# Patient Record
Sex: Female | Born: 1945 | Race: White | Hispanic: No | Smoking: Former smoker
Health system: Southern US, Community
[De-identification: ages and names within clinical notes are randomized; demographics above are authoritative.]

## PROBLEM LIST (undated history)

## (undated) DIAGNOSIS — M6530 Trigger finger, unspecified finger: Secondary | ICD-10-CM

## (undated) DIAGNOSIS — I7122 Aneurysm of the aortic arch, without rupture: Secondary | ICD-10-CM

## (undated) DIAGNOSIS — E790 Hyperuricemia without signs of inflammatory arthritis and tophaceous disease: Secondary | ICD-10-CM

## (undated) DIAGNOSIS — Z9289 Personal history of other medical treatment: Secondary | ICD-10-CM

## (undated) DIAGNOSIS — I10 Essential (primary) hypertension: Secondary | ICD-10-CM

## (undated) DIAGNOSIS — I319 Disease of pericardium, unspecified: Secondary | ICD-10-CM

## (undated) DIAGNOSIS — M7661 Achilles tendinitis, right leg: Secondary | ICD-10-CM

## (undated) DIAGNOSIS — M359 Systemic involvement of connective tissue, unspecified: Secondary | ICD-10-CM

## (undated) DIAGNOSIS — M5136 Other intervertebral disc degeneration, lumbar region: Secondary | ICD-10-CM

## (undated) DIAGNOSIS — M4310 Spondylolisthesis, site unspecified: Secondary | ICD-10-CM

## (undated) DIAGNOSIS — K449 Diaphragmatic hernia without obstruction or gangrene: Secondary | ICD-10-CM

## (undated) DIAGNOSIS — M3501 Sicca syndrome with keratoconjunctivitis: Secondary | ICD-10-CM

## (undated) DIAGNOSIS — H16223 Keratoconjunctivitis sicca, not specified as Sjogren's, bilateral: Secondary | ICD-10-CM

## (undated) DIAGNOSIS — Q438 Other specified congenital malformations of intestine: Secondary | ICD-10-CM

## (undated) DIAGNOSIS — M3500 Sicca syndrome, unspecified: Secondary | ICD-10-CM

## (undated) DIAGNOSIS — R55 Syncope and collapse: Secondary | ICD-10-CM

## (undated) DIAGNOSIS — E119 Type 2 diabetes mellitus without complications: Secondary | ICD-10-CM

## (undated) DIAGNOSIS — M1990 Unspecified osteoarthritis, unspecified site: Secondary | ICD-10-CM

## (undated) DIAGNOSIS — I890 Lymphedema, not elsewhere classified: Secondary | ICD-10-CM

## (undated) DIAGNOSIS — M109 Gout, unspecified: Secondary | ICD-10-CM

## (undated) DIAGNOSIS — K2980 Duodenitis without bleeding: Secondary | ICD-10-CM

## (undated) DIAGNOSIS — M67471 Ganglion, right ankle and foot: Secondary | ICD-10-CM

## (undated) DIAGNOSIS — E785 Hyperlipidemia, unspecified: Secondary | ICD-10-CM

## (undated) DIAGNOSIS — K279 Peptic ulcer, site unspecified, unspecified as acute or chronic, without hemorrhage or perforation: Secondary | ICD-10-CM

## (undated) DIAGNOSIS — M51369 Other intervertebral disc degeneration, lumbar region without mention of lumbar back pain or lower extremity pain: Secondary | ICD-10-CM

## (undated) DIAGNOSIS — J45909 Unspecified asthma, uncomplicated: Secondary | ICD-10-CM

## (undated) DIAGNOSIS — K219 Gastro-esophageal reflux disease without esophagitis: Secondary | ICD-10-CM

## (undated) DIAGNOSIS — K5790 Diverticulosis of intestine, part unspecified, without perforation or abscess without bleeding: Secondary | ICD-10-CM

## (undated) HISTORY — DX: Unspecified osteoarthritis, unspecified site: M19.90

## (undated) HISTORY — DX: Diverticulosis of intestine, part unspecified, without perforation or abscess without bleeding: K57.90

## (undated) HISTORY — DX: Ganglion, right ankle and foot: M67.471

## (undated) HISTORY — DX: Other intervertebral disc degeneration, lumbar region: M51.36

## (undated) HISTORY — PX: CHOLECYSTECTOMY: SHX55

## (undated) HISTORY — DX: Sjogren syndrome, unspecified: M35.00

## (undated) HISTORY — PX: SHOULDER SURGERY: SHX246

## (undated) HISTORY — PX: OTHER SURGICAL HISTORY: SHX169

## (undated) HISTORY — DX: Other specified congenital malformations of intestine: Q43.8

## (undated) HISTORY — DX: Duodenitis without bleeding: K29.80

## (undated) HISTORY — DX: Unspecified asthma, uncomplicated: J45.909

## (undated) HISTORY — PX: KNEE SURGERY: SHX244

## (undated) HISTORY — PX: TONSILLECTOMY AND ADENOIDECTOMY: SUR1326

## (undated) HISTORY — DX: Sjogren syndrome with keratoconjunctivitis: M35.01

## (undated) HISTORY — DX: Disease of pericardium, unspecified: I31.9

## (undated) HISTORY — DX: Diaphragmatic hernia without obstruction or gangrene: K44.9

## (undated) HISTORY — PX: BREAST BIOPSY: SHX20

## (undated) HISTORY — DX: Systemic involvement of connective tissue, unspecified: M35.9

## (undated) HISTORY — DX: Peptic ulcer, site unspecified, unspecified as acute or chronic, without hemorrhage or perforation: K27.9

## (undated) HISTORY — DX: Hyperuricemia without signs of inflammatory arthritis and tophaceous disease: E79.0

## (undated) HISTORY — DX: Lymphedema, not elsewhere classified: I89.0

## (undated) HISTORY — DX: Type 2 diabetes mellitus without complications: E11.9

## (undated) HISTORY — DX: Personal history of other medical treatment: Z92.89

## (undated) HISTORY — DX: Keratoconjunctivitis sicca, not specified as Sjogren's, bilateral: H16.223

## (undated) HISTORY — DX: Hyperlipidemia, unspecified: E78.5

## (undated) HISTORY — DX: Syncope and collapse: R55

## (undated) HISTORY — DX: Essential (primary) hypertension: I10

## (undated) HISTORY — PX: ABDOMINAL HYSTERECTOMY: SHX81

## (undated) HISTORY — DX: Other intervertebral disc degeneration, lumbar region without mention of lumbar back pain or lower extremity pain: M51.369

## (undated) HISTORY — DX: Gastro-esophageal reflux disease without esophagitis: K21.9

## (undated) HISTORY — DX: Spondylolisthesis, site unspecified: M43.10

## (undated) HISTORY — DX: Trigger finger, unspecified finger: M65.30

## (undated) HISTORY — PX: WISDOM TOOTH EXTRACTION: SHX21

## (undated) HISTORY — DX: Achilles tendinitis, right leg: M76.61

## (undated) HISTORY — DX: Gout, unspecified: M10.9

## (undated) HISTORY — PX: EXCISION NEUROMA: SHX6350

---

## 1983-10-10 HISTORY — PX: OTHER SURGICAL HISTORY: SHX169

## 1984-06-11 HISTORY — PX: BREAST EXCISIONAL BIOPSY: SUR124

## 2007-04-12 HISTORY — PX: TOTAL HIP ARTHROPLASTY: SHX124

## 2010-06-30 DIAGNOSIS — D126 Benign neoplasm of colon, unspecified: Secondary | ICD-10-CM | POA: Insufficient documentation

## 2010-06-30 DIAGNOSIS — R1011 Right upper quadrant pain: Secondary | ICD-10-CM | POA: Insufficient documentation

## 2014-07-29 DIAGNOSIS — M25511 Pain in right shoulder: Secondary | ICD-10-CM | POA: Insufficient documentation

## 2014-08-02 DIAGNOSIS — M87052 Idiopathic aseptic necrosis of left femur: Secondary | ICD-10-CM | POA: Insufficient documentation

## 2014-08-02 DIAGNOSIS — E7800 Pure hypercholesterolemia, unspecified: Secondary | ICD-10-CM | POA: Insufficient documentation

## 2014-08-02 DIAGNOSIS — M6530 Trigger finger, unspecified finger: Secondary | ICD-10-CM | POA: Insufficient documentation

## 2014-08-02 DIAGNOSIS — E790 Hyperuricemia without signs of inflammatory arthritis and tophaceous disease: Secondary | ICD-10-CM | POA: Insufficient documentation

## 2014-08-02 DIAGNOSIS — K5750 Diverticulosis of both small and large intestine without perforation or abscess without bleeding: Secondary | ICD-10-CM | POA: Insufficient documentation

## 2014-08-02 DIAGNOSIS — K660 Peritoneal adhesions (postprocedural) (postinfection): Secondary | ICD-10-CM | POA: Insufficient documentation

## 2014-08-02 DIAGNOSIS — M47816 Spondylosis without myelopathy or radiculopathy, lumbar region: Secondary | ICD-10-CM | POA: Insufficient documentation

## 2014-08-03 DIAGNOSIS — I10 Essential (primary) hypertension: Secondary | ICD-10-CM | POA: Insufficient documentation

## 2014-08-03 DIAGNOSIS — I129 Hypertensive chronic kidney disease with stage 1 through stage 4 chronic kidney disease, or unspecified chronic kidney disease: Secondary | ICD-10-CM | POA: Insufficient documentation

## 2014-08-24 DIAGNOSIS — E739 Lactose intolerance, unspecified: Secondary | ICD-10-CM | POA: Insufficient documentation

## 2014-08-24 DIAGNOSIS — R748 Abnormal levels of other serum enzymes: Secondary | ICD-10-CM | POA: Insufficient documentation

## 2014-08-24 DIAGNOSIS — R609 Edema, unspecified: Secondary | ICD-10-CM | POA: Insufficient documentation

## 2014-08-24 DIAGNOSIS — E669 Obesity, unspecified: Secondary | ICD-10-CM | POA: Insufficient documentation

## 2014-09-07 DIAGNOSIS — M3500 Sicca syndrome, unspecified: Secondary | ICD-10-CM | POA: Insufficient documentation

## 2015-04-05 DIAGNOSIS — I319 Disease of pericardium, unspecified: Secondary | ICD-10-CM | POA: Insufficient documentation

## 2015-04-05 DIAGNOSIS — M899 Disorder of bone, unspecified: Secondary | ICD-10-CM | POA: Insufficient documentation

## 2015-04-05 DIAGNOSIS — S0993XA Unspecified injury of face, initial encounter: Secondary | ICD-10-CM | POA: Insufficient documentation

## 2015-06-03 DIAGNOSIS — J4520 Mild intermittent asthma, uncomplicated: Secondary | ICD-10-CM | POA: Insufficient documentation

## 2015-06-03 DIAGNOSIS — M359 Systemic involvement of connective tissue, unspecified: Secondary | ICD-10-CM | POA: Insufficient documentation

## 2015-06-03 DIAGNOSIS — Z87898 Personal history of other specified conditions: Secondary | ICD-10-CM | POA: Insufficient documentation

## 2015-06-03 DIAGNOSIS — K222 Esophageal obstruction: Secondary | ICD-10-CM | POA: Insufficient documentation

## 2015-06-03 DIAGNOSIS — M109 Gout, unspecified: Secondary | ICD-10-CM | POA: Insufficient documentation

## 2015-06-03 DIAGNOSIS — K259 Gastric ulcer, unspecified as acute or chronic, without hemorrhage or perforation: Secondary | ICD-10-CM | POA: Insufficient documentation

## 2015-10-16 DIAGNOSIS — E118 Type 2 diabetes mellitus with unspecified complications: Secondary | ICD-10-CM | POA: Insufficient documentation

## 2015-10-16 DIAGNOSIS — E1149 Type 2 diabetes mellitus with other diabetic neurological complication: Secondary | ICD-10-CM | POA: Insufficient documentation

## 2015-10-16 DIAGNOSIS — E1140 Type 2 diabetes mellitus with diabetic neuropathy, unspecified: Secondary | ICD-10-CM | POA: Insufficient documentation

## 2015-10-20 DIAGNOSIS — E559 Vitamin D deficiency, unspecified: Secondary | ICD-10-CM | POA: Insufficient documentation

## 2015-10-20 DIAGNOSIS — M7712 Lateral epicondylitis, left elbow: Secondary | ICD-10-CM | POA: Insufficient documentation

## 2016-06-19 DIAGNOSIS — E118 Type 2 diabetes mellitus with unspecified complications: Secondary | ICD-10-CM | POA: Diagnosis not present

## 2016-06-19 DIAGNOSIS — Z6836 Body mass index (BMI) 36.0-36.9, adult: Secondary | ICD-10-CM | POA: Diagnosis not present

## 2016-06-19 DIAGNOSIS — E7800 Pure hypercholesterolemia, unspecified: Secondary | ICD-10-CM | POA: Diagnosis not present

## 2016-06-19 DIAGNOSIS — E559 Vitamin D deficiency, unspecified: Secondary | ICD-10-CM | POA: Diagnosis not present

## 2016-06-19 DIAGNOSIS — E669 Obesity, unspecified: Secondary | ICD-10-CM | POA: Diagnosis not present

## 2016-06-19 DIAGNOSIS — Z794 Long term (current) use of insulin: Secondary | ICD-10-CM | POA: Diagnosis not present

## 2016-06-19 DIAGNOSIS — E1142 Type 2 diabetes mellitus with diabetic polyneuropathy: Secondary | ICD-10-CM | POA: Diagnosis not present

## 2016-06-19 DIAGNOSIS — I10 Essential (primary) hypertension: Secondary | ICD-10-CM | POA: Diagnosis not present

## 2016-07-24 DIAGNOSIS — H40023 Open angle with borderline findings, high risk, bilateral: Secondary | ICD-10-CM | POA: Diagnosis not present

## 2016-07-24 DIAGNOSIS — E119 Type 2 diabetes mellitus without complications: Secondary | ICD-10-CM | POA: Diagnosis not present

## 2016-07-24 DIAGNOSIS — H04123 Dry eye syndrome of bilateral lacrimal glands: Secondary | ICD-10-CM | POA: Diagnosis not present

## 2016-07-24 DIAGNOSIS — H2513 Age-related nuclear cataract, bilateral: Secondary | ICD-10-CM | POA: Diagnosis not present

## 2016-07-31 DIAGNOSIS — H409 Unspecified glaucoma: Secondary | ICD-10-CM | POA: Insufficient documentation

## 2016-07-31 DIAGNOSIS — K219 Gastro-esophageal reflux disease without esophagitis: Secondary | ICD-10-CM | POA: Diagnosis not present

## 2016-07-31 DIAGNOSIS — R110 Nausea: Secondary | ICD-10-CM | POA: Diagnosis not present

## 2016-07-31 DIAGNOSIS — H269 Unspecified cataract: Secondary | ICD-10-CM | POA: Insufficient documentation

## 2016-07-31 DIAGNOSIS — E1142 Type 2 diabetes mellitus with diabetic polyneuropathy: Secondary | ICD-10-CM | POA: Diagnosis not present

## 2016-07-31 DIAGNOSIS — K259 Gastric ulcer, unspecified as acute or chronic, without hemorrhage or perforation: Secondary | ICD-10-CM | POA: Diagnosis not present

## 2016-08-07 DIAGNOSIS — Z0000 Encounter for general adult medical examination without abnormal findings: Secondary | ICD-10-CM | POA: Diagnosis not present

## 2016-08-07 DIAGNOSIS — M899 Disorder of bone, unspecified: Secondary | ICD-10-CM | POA: Diagnosis not present

## 2016-08-07 DIAGNOSIS — Z794 Long term (current) use of insulin: Secondary | ICD-10-CM | POA: Diagnosis not present

## 2016-08-07 DIAGNOSIS — E118 Type 2 diabetes mellitus with unspecified complications: Secondary | ICD-10-CM | POA: Diagnosis not present

## 2016-08-07 DIAGNOSIS — E1169 Type 2 diabetes mellitus with other specified complication: Secondary | ICD-10-CM | POA: Diagnosis not present

## 2016-08-07 DIAGNOSIS — E559 Vitamin D deficiency, unspecified: Secondary | ICD-10-CM | POA: Diagnosis not present

## 2016-08-13 DIAGNOSIS — R1011 Right upper quadrant pain: Secondary | ICD-10-CM | POA: Diagnosis not present

## 2016-08-13 DIAGNOSIS — Z8711 Personal history of peptic ulcer disease: Secondary | ICD-10-CM | POA: Diagnosis not present

## 2016-08-13 DIAGNOSIS — R112 Nausea with vomiting, unspecified: Secondary | ICD-10-CM | POA: Diagnosis not present

## 2016-08-13 DIAGNOSIS — K2930 Chronic superficial gastritis without bleeding: Secondary | ICD-10-CM | POA: Diagnosis not present

## 2016-08-13 DIAGNOSIS — I10 Essential (primary) hypertension: Secondary | ICD-10-CM | POA: Diagnosis not present

## 2016-08-13 DIAGNOSIS — K222 Esophageal obstruction: Secondary | ICD-10-CM | POA: Diagnosis not present

## 2016-08-13 DIAGNOSIS — R109 Unspecified abdominal pain: Secondary | ICD-10-CM | POA: Diagnosis not present

## 2016-08-13 DIAGNOSIS — E785 Hyperlipidemia, unspecified: Secondary | ICD-10-CM | POA: Diagnosis not present

## 2016-08-13 DIAGNOSIS — K259 Gastric ulcer, unspecified as acute or chronic, without hemorrhage or perforation: Secondary | ICD-10-CM | POA: Diagnosis not present

## 2016-08-13 DIAGNOSIS — K449 Diaphragmatic hernia without obstruction or gangrene: Secondary | ICD-10-CM | POA: Diagnosis not present

## 2016-08-13 DIAGNOSIS — K5790 Diverticulosis of intestine, part unspecified, without perforation or abscess without bleeding: Secondary | ICD-10-CM | POA: Diagnosis not present

## 2016-09-03 DIAGNOSIS — K222 Esophageal obstruction: Secondary | ICD-10-CM | POA: Diagnosis not present

## 2016-09-03 DIAGNOSIS — K449 Diaphragmatic hernia without obstruction or gangrene: Secondary | ICD-10-CM | POA: Diagnosis not present

## 2016-09-03 DIAGNOSIS — K219 Gastro-esophageal reflux disease without esophagitis: Secondary | ICD-10-CM | POA: Diagnosis not present

## 2016-09-03 DIAGNOSIS — E119 Type 2 diabetes mellitus without complications: Secondary | ICD-10-CM | POA: Diagnosis not present

## 2016-09-03 DIAGNOSIS — K3189 Other diseases of stomach and duodenum: Secondary | ICD-10-CM | POA: Diagnosis not present

## 2016-09-03 DIAGNOSIS — I10 Essential (primary) hypertension: Secondary | ICD-10-CM | POA: Diagnosis not present

## 2016-09-03 DIAGNOSIS — R933 Abnormal findings on diagnostic imaging of other parts of digestive tract: Secondary | ICD-10-CM | POA: Diagnosis not present

## 2016-09-05 DIAGNOSIS — M545 Low back pain: Secondary | ICD-10-CM | POA: Diagnosis not present

## 2016-09-05 DIAGNOSIS — M5432 Sciatica, left side: Secondary | ICD-10-CM | POA: Diagnosis not present

## 2016-09-05 DIAGNOSIS — M546 Pain in thoracic spine: Secondary | ICD-10-CM | POA: Diagnosis not present

## 2016-09-05 DIAGNOSIS — M5127 Other intervertebral disc displacement, lumbosacral region: Secondary | ICD-10-CM | POA: Diagnosis not present

## 2016-09-05 DIAGNOSIS — M9905 Segmental and somatic dysfunction of pelvic region: Secondary | ICD-10-CM | POA: Diagnosis not present

## 2016-09-05 DIAGNOSIS — M9902 Segmental and somatic dysfunction of thoracic region: Secondary | ICD-10-CM | POA: Diagnosis not present

## 2016-09-05 DIAGNOSIS — M9903 Segmental and somatic dysfunction of lumbar region: Secondary | ICD-10-CM | POA: Diagnosis not present

## 2016-09-12 DIAGNOSIS — M545 Low back pain: Secondary | ICD-10-CM | POA: Diagnosis not present

## 2016-09-12 DIAGNOSIS — M9903 Segmental and somatic dysfunction of lumbar region: Secondary | ICD-10-CM | POA: Diagnosis not present

## 2016-09-12 DIAGNOSIS — M546 Pain in thoracic spine: Secondary | ICD-10-CM | POA: Diagnosis not present

## 2016-09-12 DIAGNOSIS — M9905 Segmental and somatic dysfunction of pelvic region: Secondary | ICD-10-CM | POA: Diagnosis not present

## 2016-09-12 DIAGNOSIS — M5127 Other intervertebral disc displacement, lumbosacral region: Secondary | ICD-10-CM | POA: Diagnosis not present

## 2016-09-12 DIAGNOSIS — M5432 Sciatica, left side: Secondary | ICD-10-CM | POA: Diagnosis not present

## 2016-09-12 DIAGNOSIS — M9902 Segmental and somatic dysfunction of thoracic region: Secondary | ICD-10-CM | POA: Diagnosis not present

## 2016-09-14 DIAGNOSIS — K259 Gastric ulcer, unspecified as acute or chronic, without hemorrhage or perforation: Secondary | ICD-10-CM | POA: Diagnosis not present

## 2016-09-14 DIAGNOSIS — K2950 Unspecified chronic gastritis without bleeding: Secondary | ICD-10-CM | POA: Diagnosis not present

## 2016-09-14 DIAGNOSIS — I10 Essential (primary) hypertension: Secondary | ICD-10-CM | POA: Diagnosis not present

## 2016-09-14 DIAGNOSIS — Z0000 Encounter for general adult medical examination without abnormal findings: Secondary | ICD-10-CM | POA: Diagnosis not present

## 2016-09-19 DIAGNOSIS — M545 Low back pain: Secondary | ICD-10-CM | POA: Diagnosis not present

## 2016-09-19 DIAGNOSIS — M5127 Other intervertebral disc displacement, lumbosacral region: Secondary | ICD-10-CM | POA: Diagnosis not present

## 2016-09-19 DIAGNOSIS — M546 Pain in thoracic spine: Secondary | ICD-10-CM | POA: Diagnosis not present

## 2016-09-19 DIAGNOSIS — M9902 Segmental and somatic dysfunction of thoracic region: Secondary | ICD-10-CM | POA: Diagnosis not present

## 2016-09-19 DIAGNOSIS — M9905 Segmental and somatic dysfunction of pelvic region: Secondary | ICD-10-CM | POA: Diagnosis not present

## 2016-09-19 DIAGNOSIS — M9903 Segmental and somatic dysfunction of lumbar region: Secondary | ICD-10-CM | POA: Diagnosis not present

## 2016-09-19 DIAGNOSIS — M5432 Sciatica, left side: Secondary | ICD-10-CM | POA: Diagnosis not present

## 2016-09-21 DIAGNOSIS — K6389 Other specified diseases of intestine: Secondary | ICD-10-CM | POA: Diagnosis not present

## 2016-09-21 DIAGNOSIS — Q438 Other specified congenital malformations of intestine: Secondary | ICD-10-CM | POA: Diagnosis not present

## 2016-09-26 DIAGNOSIS — M5127 Other intervertebral disc displacement, lumbosacral region: Secondary | ICD-10-CM | POA: Diagnosis not present

## 2016-09-26 DIAGNOSIS — M5432 Sciatica, left side: Secondary | ICD-10-CM | POA: Diagnosis not present

## 2016-09-26 DIAGNOSIS — M9903 Segmental and somatic dysfunction of lumbar region: Secondary | ICD-10-CM | POA: Diagnosis not present

## 2016-09-26 DIAGNOSIS — M9902 Segmental and somatic dysfunction of thoracic region: Secondary | ICD-10-CM | POA: Diagnosis not present

## 2016-09-26 DIAGNOSIS — M545 Low back pain: Secondary | ICD-10-CM | POA: Diagnosis not present

## 2016-09-26 DIAGNOSIS — M9905 Segmental and somatic dysfunction of pelvic region: Secondary | ICD-10-CM | POA: Diagnosis not present

## 2016-09-26 DIAGNOSIS — M546 Pain in thoracic spine: Secondary | ICD-10-CM | POA: Diagnosis not present

## 2016-10-03 DIAGNOSIS — M5127 Other intervertebral disc displacement, lumbosacral region: Secondary | ICD-10-CM | POA: Diagnosis not present

## 2016-10-03 DIAGNOSIS — M9903 Segmental and somatic dysfunction of lumbar region: Secondary | ICD-10-CM | POA: Diagnosis not present

## 2016-10-03 DIAGNOSIS — M545 Low back pain: Secondary | ICD-10-CM | POA: Diagnosis not present

## 2016-10-03 DIAGNOSIS — M9902 Segmental and somatic dysfunction of thoracic region: Secondary | ICD-10-CM | POA: Diagnosis not present

## 2016-10-03 DIAGNOSIS — M9905 Segmental and somatic dysfunction of pelvic region: Secondary | ICD-10-CM | POA: Diagnosis not present

## 2016-10-03 DIAGNOSIS — M5432 Sciatica, left side: Secondary | ICD-10-CM | POA: Diagnosis not present

## 2016-10-03 DIAGNOSIS — M546 Pain in thoracic spine: Secondary | ICD-10-CM | POA: Diagnosis not present

## 2016-10-13 DIAGNOSIS — M5416 Radiculopathy, lumbar region: Secondary | ICD-10-CM | POA: Diagnosis not present

## 2016-10-23 DIAGNOSIS — M25562 Pain in left knee: Secondary | ICD-10-CM | POA: Insufficient documentation

## 2016-10-23 DIAGNOSIS — Z0000 Encounter for general adult medical examination without abnormal findings: Secondary | ICD-10-CM | POA: Diagnosis not present

## 2016-10-23 DIAGNOSIS — I10 Essential (primary) hypertension: Secondary | ICD-10-CM | POA: Diagnosis not present

## 2016-10-23 DIAGNOSIS — G8929 Other chronic pain: Secondary | ICD-10-CM | POA: Diagnosis not present

## 2016-10-30 DIAGNOSIS — M25559 Pain in unspecified hip: Secondary | ICD-10-CM | POA: Diagnosis not present

## 2016-10-30 DIAGNOSIS — M25569 Pain in unspecified knee: Secondary | ICD-10-CM | POA: Diagnosis not present

## 2016-11-01 DIAGNOSIS — T8579XA Infection and inflammatory reaction due to other internal prosthetic devices, implants and grafts, initial encounter: Secondary | ICD-10-CM | POA: Diagnosis not present

## 2016-11-01 DIAGNOSIS — K259 Gastric ulcer, unspecified as acute or chronic, without hemorrhage or perforation: Secondary | ICD-10-CM | POA: Diagnosis not present

## 2016-11-26 DIAGNOSIS — E7800 Pure hypercholesterolemia, unspecified: Secondary | ICD-10-CM | POA: Diagnosis not present

## 2016-11-26 DIAGNOSIS — I10 Essential (primary) hypertension: Secondary | ICD-10-CM | POA: Diagnosis not present

## 2016-11-26 DIAGNOSIS — E1142 Type 2 diabetes mellitus with diabetic polyneuropathy: Secondary | ICD-10-CM | POA: Diagnosis not present

## 2016-11-26 DIAGNOSIS — E119 Type 2 diabetes mellitus without complications: Secondary | ICD-10-CM | POA: Diagnosis not present

## 2016-12-16 DIAGNOSIS — N644 Mastodynia: Secondary | ICD-10-CM | POA: Diagnosis not present

## 2016-12-19 DIAGNOSIS — I491 Atrial premature depolarization: Secondary | ICD-10-CM | POA: Diagnosis not present

## 2016-12-19 DIAGNOSIS — I10 Essential (primary) hypertension: Secondary | ICD-10-CM | POA: Diagnosis not present

## 2016-12-26 DIAGNOSIS — M9905 Segmental and somatic dysfunction of pelvic region: Secondary | ICD-10-CM | POA: Diagnosis not present

## 2016-12-26 DIAGNOSIS — M5417 Radiculopathy, lumbosacral region: Secondary | ICD-10-CM | POA: Diagnosis not present

## 2016-12-26 DIAGNOSIS — M5127 Other intervertebral disc displacement, lumbosacral region: Secondary | ICD-10-CM | POA: Diagnosis not present

## 2016-12-26 DIAGNOSIS — M9903 Segmental and somatic dysfunction of lumbar region: Secondary | ICD-10-CM | POA: Diagnosis not present

## 2016-12-26 DIAGNOSIS — M545 Low back pain: Secondary | ICD-10-CM | POA: Diagnosis not present

## 2016-12-26 DIAGNOSIS — N644 Mastodynia: Secondary | ICD-10-CM | POA: Diagnosis not present

## 2016-12-26 DIAGNOSIS — N6320 Unspecified lump in the left breast, unspecified quadrant: Secondary | ICD-10-CM | POA: Diagnosis not present

## 2016-12-26 DIAGNOSIS — M9901 Segmental and somatic dysfunction of cervical region: Secondary | ICD-10-CM | POA: Diagnosis not present

## 2016-12-26 DIAGNOSIS — M531 Cervicobrachial syndrome: Secondary | ICD-10-CM | POA: Diagnosis not present

## 2016-12-31 DIAGNOSIS — M9901 Segmental and somatic dysfunction of cervical region: Secondary | ICD-10-CM | POA: Diagnosis not present

## 2016-12-31 DIAGNOSIS — M531 Cervicobrachial syndrome: Secondary | ICD-10-CM | POA: Diagnosis not present

## 2016-12-31 DIAGNOSIS — M9905 Segmental and somatic dysfunction of pelvic region: Secondary | ICD-10-CM | POA: Diagnosis not present

## 2016-12-31 DIAGNOSIS — M545 Low back pain: Secondary | ICD-10-CM | POA: Diagnosis not present

## 2016-12-31 DIAGNOSIS — M5417 Radiculopathy, lumbosacral region: Secondary | ICD-10-CM | POA: Diagnosis not present

## 2016-12-31 DIAGNOSIS — M5127 Other intervertebral disc displacement, lumbosacral region: Secondary | ICD-10-CM | POA: Diagnosis not present

## 2016-12-31 DIAGNOSIS — M9903 Segmental and somatic dysfunction of lumbar region: Secondary | ICD-10-CM | POA: Diagnosis not present

## 2017-01-01 DIAGNOSIS — N644 Mastodynia: Secondary | ICD-10-CM | POA: Diagnosis not present

## 2017-01-02 DIAGNOSIS — M9905 Segmental and somatic dysfunction of pelvic region: Secondary | ICD-10-CM | POA: Diagnosis not present

## 2017-01-02 DIAGNOSIS — M9903 Segmental and somatic dysfunction of lumbar region: Secondary | ICD-10-CM | POA: Diagnosis not present

## 2017-01-02 DIAGNOSIS — M545 Low back pain: Secondary | ICD-10-CM | POA: Diagnosis not present

## 2017-01-02 DIAGNOSIS — M531 Cervicobrachial syndrome: Secondary | ICD-10-CM | POA: Diagnosis not present

## 2017-01-02 DIAGNOSIS — M9901 Segmental and somatic dysfunction of cervical region: Secondary | ICD-10-CM | POA: Diagnosis not present

## 2017-01-02 DIAGNOSIS — M5417 Radiculopathy, lumbosacral region: Secondary | ICD-10-CM | POA: Diagnosis not present

## 2017-01-02 DIAGNOSIS — D126 Benign neoplasm of colon, unspecified: Secondary | ICD-10-CM | POA: Diagnosis not present

## 2017-01-02 DIAGNOSIS — M5127 Other intervertebral disc displacement, lumbosacral region: Secondary | ICD-10-CM | POA: Diagnosis not present

## 2017-01-07 DIAGNOSIS — M5417 Radiculopathy, lumbosacral region: Secondary | ICD-10-CM | POA: Diagnosis not present

## 2017-01-07 DIAGNOSIS — M9905 Segmental and somatic dysfunction of pelvic region: Secondary | ICD-10-CM | POA: Diagnosis not present

## 2017-01-07 DIAGNOSIS — M545 Low back pain: Secondary | ICD-10-CM | POA: Diagnosis not present

## 2017-01-07 DIAGNOSIS — M531 Cervicobrachial syndrome: Secondary | ICD-10-CM | POA: Diagnosis not present

## 2017-01-07 DIAGNOSIS — M9901 Segmental and somatic dysfunction of cervical region: Secondary | ICD-10-CM | POA: Diagnosis not present

## 2017-01-07 DIAGNOSIS — M5127 Other intervertebral disc displacement, lumbosacral region: Secondary | ICD-10-CM | POA: Diagnosis not present

## 2017-01-07 DIAGNOSIS — M9903 Segmental and somatic dysfunction of lumbar region: Secondary | ICD-10-CM | POA: Diagnosis not present

## 2017-01-09 DIAGNOSIS — M9903 Segmental and somatic dysfunction of lumbar region: Secondary | ICD-10-CM | POA: Diagnosis not present

## 2017-01-09 DIAGNOSIS — M5417 Radiculopathy, lumbosacral region: Secondary | ICD-10-CM | POA: Diagnosis not present

## 2017-01-09 DIAGNOSIS — M9901 Segmental and somatic dysfunction of cervical region: Secondary | ICD-10-CM | POA: Diagnosis not present

## 2017-01-09 DIAGNOSIS — M531 Cervicobrachial syndrome: Secondary | ICD-10-CM | POA: Diagnosis not present

## 2017-01-09 DIAGNOSIS — M545 Low back pain: Secondary | ICD-10-CM | POA: Diagnosis not present

## 2017-01-09 DIAGNOSIS — M5127 Other intervertebral disc displacement, lumbosacral region: Secondary | ICD-10-CM | POA: Diagnosis not present

## 2017-01-09 DIAGNOSIS — M9905 Segmental and somatic dysfunction of pelvic region: Secondary | ICD-10-CM | POA: Diagnosis not present

## 2017-01-14 DIAGNOSIS — E119 Type 2 diabetes mellitus without complications: Secondary | ICD-10-CM | POA: Diagnosis not present

## 2017-01-14 DIAGNOSIS — H01113 Allergic dermatitis of right eye, unspecified eyelid: Secondary | ICD-10-CM | POA: Diagnosis not present

## 2017-01-14 DIAGNOSIS — H40023 Open angle with borderline findings, high risk, bilateral: Secondary | ICD-10-CM | POA: Diagnosis not present

## 2017-01-14 DIAGNOSIS — H04123 Dry eye syndrome of bilateral lacrimal glands: Secondary | ICD-10-CM | POA: Diagnosis not present

## 2017-01-16 DIAGNOSIS — M9901 Segmental and somatic dysfunction of cervical region: Secondary | ICD-10-CM | POA: Diagnosis not present

## 2017-01-16 DIAGNOSIS — M5417 Radiculopathy, lumbosacral region: Secondary | ICD-10-CM | POA: Diagnosis not present

## 2017-01-16 DIAGNOSIS — M9905 Segmental and somatic dysfunction of pelvic region: Secondary | ICD-10-CM | POA: Diagnosis not present

## 2017-01-16 DIAGNOSIS — M5127 Other intervertebral disc displacement, lumbosacral region: Secondary | ICD-10-CM | POA: Diagnosis not present

## 2017-01-16 DIAGNOSIS — M9903 Segmental and somatic dysfunction of lumbar region: Secondary | ICD-10-CM | POA: Diagnosis not present

## 2017-01-16 DIAGNOSIS — M545 Low back pain: Secondary | ICD-10-CM | POA: Diagnosis not present

## 2017-01-16 DIAGNOSIS — M531 Cervicobrachial syndrome: Secondary | ICD-10-CM | POA: Diagnosis not present

## 2017-01-22 DIAGNOSIS — N3001 Acute cystitis with hematuria: Secondary | ICD-10-CM | POA: Diagnosis not present

## 2017-01-22 DIAGNOSIS — N3000 Acute cystitis without hematuria: Secondary | ICD-10-CM | POA: Diagnosis not present

## 2017-01-30 DIAGNOSIS — N3001 Acute cystitis with hematuria: Secondary | ICD-10-CM | POA: Diagnosis not present

## 2017-01-30 DIAGNOSIS — M545 Low back pain: Secondary | ICD-10-CM | POA: Diagnosis not present

## 2017-01-30 DIAGNOSIS — M9901 Segmental and somatic dysfunction of cervical region: Secondary | ICD-10-CM | POA: Diagnosis not present

## 2017-01-30 DIAGNOSIS — M5127 Other intervertebral disc displacement, lumbosacral region: Secondary | ICD-10-CM | POA: Diagnosis not present

## 2017-01-30 DIAGNOSIS — M9903 Segmental and somatic dysfunction of lumbar region: Secondary | ICD-10-CM | POA: Diagnosis not present

## 2017-01-30 DIAGNOSIS — M9905 Segmental and somatic dysfunction of pelvic region: Secondary | ICD-10-CM | POA: Diagnosis not present

## 2017-01-30 DIAGNOSIS — M531 Cervicobrachial syndrome: Secondary | ICD-10-CM | POA: Diagnosis not present

## 2017-01-30 DIAGNOSIS — M5417 Radiculopathy, lumbosacral region: Secondary | ICD-10-CM | POA: Diagnosis not present

## 2017-02-06 DIAGNOSIS — M9903 Segmental and somatic dysfunction of lumbar region: Secondary | ICD-10-CM | POA: Diagnosis not present

## 2017-02-06 DIAGNOSIS — M531 Cervicobrachial syndrome: Secondary | ICD-10-CM | POA: Diagnosis not present

## 2017-02-06 DIAGNOSIS — M545 Low back pain: Secondary | ICD-10-CM | POA: Diagnosis not present

## 2017-02-06 DIAGNOSIS — M9905 Segmental and somatic dysfunction of pelvic region: Secondary | ICD-10-CM | POA: Diagnosis not present

## 2017-02-06 DIAGNOSIS — M5127 Other intervertebral disc displacement, lumbosacral region: Secondary | ICD-10-CM | POA: Diagnosis not present

## 2017-02-06 DIAGNOSIS — M9901 Segmental and somatic dysfunction of cervical region: Secondary | ICD-10-CM | POA: Diagnosis not present

## 2017-02-06 DIAGNOSIS — M5417 Radiculopathy, lumbosacral region: Secondary | ICD-10-CM | POA: Diagnosis not present

## 2017-02-20 DIAGNOSIS — J209 Acute bronchitis, unspecified: Secondary | ICD-10-CM | POA: Diagnosis not present

## 2017-02-20 DIAGNOSIS — H66002 Acute suppurative otitis media without spontaneous rupture of ear drum, left ear: Secondary | ICD-10-CM | POA: Diagnosis not present

## 2017-03-04 DIAGNOSIS — Z794 Long term (current) use of insulin: Secondary | ICD-10-CM | POA: Diagnosis not present

## 2017-03-04 DIAGNOSIS — E1165 Type 2 diabetes mellitus with hyperglycemia: Secondary | ICD-10-CM | POA: Diagnosis not present

## 2017-03-04 DIAGNOSIS — Z01818 Encounter for other preprocedural examination: Secondary | ICD-10-CM | POA: Diagnosis not present

## 2017-03-06 DIAGNOSIS — E1165 Type 2 diabetes mellitus with hyperglycemia: Secondary | ICD-10-CM | POA: Diagnosis not present

## 2017-03-06 DIAGNOSIS — Z794 Long term (current) use of insulin: Secondary | ICD-10-CM | POA: Diagnosis not present

## 2017-03-06 DIAGNOSIS — Z01818 Encounter for other preprocedural examination: Secondary | ICD-10-CM | POA: Diagnosis not present

## 2017-03-11 DIAGNOSIS — E119 Type 2 diabetes mellitus without complications: Secondary | ICD-10-CM | POA: Diagnosis not present

## 2017-03-11 DIAGNOSIS — K222 Esophageal obstruction: Secondary | ICD-10-CM | POA: Diagnosis not present

## 2017-03-11 DIAGNOSIS — Z881 Allergy status to other antibiotic agents status: Secondary | ICD-10-CM | POA: Diagnosis not present

## 2017-03-11 DIAGNOSIS — I10 Essential (primary) hypertension: Secondary | ICD-10-CM | POA: Diagnosis not present

## 2017-03-11 DIAGNOSIS — Z880 Allergy status to penicillin: Secondary | ICD-10-CM | POA: Diagnosis not present

## 2017-03-11 DIAGNOSIS — J45909 Unspecified asthma, uncomplicated: Secondary | ICD-10-CM | POA: Diagnosis not present

## 2017-03-11 DIAGNOSIS — K449 Diaphragmatic hernia without obstruction or gangrene: Secondary | ICD-10-CM | POA: Diagnosis not present

## 2017-03-11 DIAGNOSIS — K3189 Other diseases of stomach and duodenum: Secondary | ICD-10-CM | POA: Diagnosis not present

## 2017-03-11 DIAGNOSIS — K2900 Acute gastritis without bleeding: Secondary | ICD-10-CM | POA: Diagnosis not present

## 2017-03-11 DIAGNOSIS — K2960 Other gastritis without bleeding: Secondary | ICD-10-CM | POA: Diagnosis not present

## 2017-03-11 DIAGNOSIS — K319 Disease of stomach and duodenum, unspecified: Secondary | ICD-10-CM | POA: Diagnosis not present

## 2017-03-11 DIAGNOSIS — M109 Gout, unspecified: Secondary | ICD-10-CM | POA: Diagnosis not present

## 2017-03-11 DIAGNOSIS — M1990 Unspecified osteoarthritis, unspecified site: Secondary | ICD-10-CM | POA: Diagnosis not present

## 2017-03-11 DIAGNOSIS — Z882 Allergy status to sulfonamides status: Secondary | ICD-10-CM | POA: Diagnosis not present

## 2017-03-11 DIAGNOSIS — E785 Hyperlipidemia, unspecified: Secondary | ICD-10-CM | POA: Diagnosis not present

## 2017-03-11 DIAGNOSIS — Z888 Allergy status to other drugs, medicaments and biological substances status: Secondary | ICD-10-CM | POA: Diagnosis not present

## 2017-03-18 DIAGNOSIS — I10 Essential (primary) hypertension: Secondary | ICD-10-CM | POA: Diagnosis not present

## 2017-03-18 DIAGNOSIS — E119 Type 2 diabetes mellitus without complications: Secondary | ICD-10-CM | POA: Diagnosis not present

## 2017-03-18 DIAGNOSIS — E1142 Type 2 diabetes mellitus with diabetic polyneuropathy: Secondary | ICD-10-CM | POA: Diagnosis not present

## 2017-03-30 DIAGNOSIS — J209 Acute bronchitis, unspecified: Secondary | ICD-10-CM | POA: Diagnosis not present

## 2017-04-11 DIAGNOSIS — R300 Dysuria: Secondary | ICD-10-CM | POA: Diagnosis not present

## 2017-04-11 DIAGNOSIS — J069 Acute upper respiratory infection, unspecified: Secondary | ICD-10-CM | POA: Diagnosis not present

## 2017-04-28 DIAGNOSIS — N3000 Acute cystitis without hematuria: Secondary | ICD-10-CM | POA: Diagnosis not present

## 2017-05-06 DIAGNOSIS — M5127 Other intervertebral disc displacement, lumbosacral region: Secondary | ICD-10-CM | POA: Diagnosis not present

## 2017-05-06 DIAGNOSIS — M9905 Segmental and somatic dysfunction of pelvic region: Secondary | ICD-10-CM | POA: Diagnosis not present

## 2017-05-06 DIAGNOSIS — M9903 Segmental and somatic dysfunction of lumbar region: Secondary | ICD-10-CM | POA: Diagnosis not present

## 2017-05-06 DIAGNOSIS — M545 Low back pain: Secondary | ICD-10-CM | POA: Diagnosis not present

## 2017-05-08 DIAGNOSIS — M9905 Segmental and somatic dysfunction of pelvic region: Secondary | ICD-10-CM | POA: Diagnosis not present

## 2017-05-08 DIAGNOSIS — M25561 Pain in right knee: Secondary | ICD-10-CM | POA: Diagnosis not present

## 2017-05-08 DIAGNOSIS — M25551 Pain in right hip: Secondary | ICD-10-CM | POA: Diagnosis not present

## 2017-05-08 DIAGNOSIS — M545 Low back pain: Secondary | ICD-10-CM | POA: Diagnosis not present

## 2017-05-08 DIAGNOSIS — M6281 Muscle weakness (generalized): Secondary | ICD-10-CM | POA: Diagnosis not present

## 2017-05-08 DIAGNOSIS — M9903 Segmental and somatic dysfunction of lumbar region: Secondary | ICD-10-CM | POA: Diagnosis not present

## 2017-05-08 DIAGNOSIS — M25562 Pain in left knee: Secondary | ICD-10-CM | POA: Diagnosis not present

## 2017-05-08 DIAGNOSIS — M5127 Other intervertebral disc displacement, lumbosacral region: Secondary | ICD-10-CM | POA: Diagnosis not present

## 2017-05-09 DIAGNOSIS — M25562 Pain in left knee: Secondary | ICD-10-CM | POA: Diagnosis not present

## 2017-05-09 DIAGNOSIS — M25561 Pain in right knee: Secondary | ICD-10-CM | POA: Diagnosis not present

## 2017-05-09 DIAGNOSIS — M25551 Pain in right hip: Secondary | ICD-10-CM | POA: Diagnosis not present

## 2017-05-09 DIAGNOSIS — M6281 Muscle weakness (generalized): Secondary | ICD-10-CM | POA: Diagnosis not present

## 2017-05-13 DIAGNOSIS — M25561 Pain in right knee: Secondary | ICD-10-CM | POA: Diagnosis not present

## 2017-05-13 DIAGNOSIS — M25562 Pain in left knee: Secondary | ICD-10-CM | POA: Diagnosis not present

## 2017-05-13 DIAGNOSIS — M9905 Segmental and somatic dysfunction of pelvic region: Secondary | ICD-10-CM | POA: Diagnosis not present

## 2017-05-13 DIAGNOSIS — I10 Essential (primary) hypertension: Secondary | ICD-10-CM | POA: Diagnosis not present

## 2017-05-13 DIAGNOSIS — M6281 Muscle weakness (generalized): Secondary | ICD-10-CM | POA: Diagnosis not present

## 2017-05-13 DIAGNOSIS — M545 Low back pain: Secondary | ICD-10-CM | POA: Diagnosis not present

## 2017-05-13 DIAGNOSIS — M1711 Unilateral primary osteoarthritis, right knee: Secondary | ICD-10-CM | POA: Diagnosis not present

## 2017-05-13 DIAGNOSIS — M25551 Pain in right hip: Secondary | ICD-10-CM | POA: Diagnosis not present

## 2017-05-13 DIAGNOSIS — E119 Type 2 diabetes mellitus without complications: Secondary | ICD-10-CM | POA: Diagnosis not present

## 2017-05-13 DIAGNOSIS — M1712 Unilateral primary osteoarthritis, left knee: Secondary | ICD-10-CM | POA: Diagnosis not present

## 2017-05-13 DIAGNOSIS — M5127 Other intervertebral disc displacement, lumbosacral region: Secondary | ICD-10-CM | POA: Diagnosis not present

## 2017-05-13 DIAGNOSIS — Z96641 Presence of right artificial hip joint: Secondary | ICD-10-CM | POA: Diagnosis not present

## 2017-05-13 DIAGNOSIS — M9903 Segmental and somatic dysfunction of lumbar region: Secondary | ICD-10-CM | POA: Diagnosis not present

## 2017-05-13 DIAGNOSIS — R2689 Other abnormalities of gait and mobility: Secondary | ICD-10-CM | POA: Diagnosis not present

## 2017-05-13 DIAGNOSIS — M1612 Unilateral primary osteoarthritis, left hip: Secondary | ICD-10-CM | POA: Diagnosis not present

## 2017-05-13 DIAGNOSIS — I890 Lymphedema, not elsewhere classified: Secondary | ICD-10-CM | POA: Diagnosis not present

## 2017-05-15 DIAGNOSIS — M545 Low back pain: Secondary | ICD-10-CM | POA: Diagnosis not present

## 2017-05-15 DIAGNOSIS — M9903 Segmental and somatic dysfunction of lumbar region: Secondary | ICD-10-CM | POA: Diagnosis not present

## 2017-05-15 DIAGNOSIS — M9905 Segmental and somatic dysfunction of pelvic region: Secondary | ICD-10-CM | POA: Diagnosis not present

## 2017-05-15 DIAGNOSIS — M5127 Other intervertebral disc displacement, lumbosacral region: Secondary | ICD-10-CM | POA: Diagnosis not present

## 2017-05-20 DIAGNOSIS — Z96641 Presence of right artificial hip joint: Secondary | ICD-10-CM | POA: Diagnosis not present

## 2017-05-20 DIAGNOSIS — R2689 Other abnormalities of gait and mobility: Secondary | ICD-10-CM | POA: Diagnosis not present

## 2017-05-20 DIAGNOSIS — M6281 Muscle weakness (generalized): Secondary | ICD-10-CM | POA: Diagnosis not present

## 2017-05-20 DIAGNOSIS — M1711 Unilateral primary osteoarthritis, right knee: Secondary | ICD-10-CM | POA: Diagnosis not present

## 2017-05-20 DIAGNOSIS — M25561 Pain in right knee: Secondary | ICD-10-CM | POA: Diagnosis not present

## 2017-05-20 DIAGNOSIS — M25562 Pain in left knee: Secondary | ICD-10-CM | POA: Diagnosis not present

## 2017-05-20 DIAGNOSIS — E119 Type 2 diabetes mellitus without complications: Secondary | ICD-10-CM | POA: Diagnosis not present

## 2017-05-20 DIAGNOSIS — I890 Lymphedema, not elsewhere classified: Secondary | ICD-10-CM | POA: Diagnosis not present

## 2017-05-20 DIAGNOSIS — M1712 Unilateral primary osteoarthritis, left knee: Secondary | ICD-10-CM | POA: Diagnosis not present

## 2017-05-20 DIAGNOSIS — I10 Essential (primary) hypertension: Secondary | ICD-10-CM | POA: Diagnosis not present

## 2017-05-20 DIAGNOSIS — M1612 Unilateral primary osteoarthritis, left hip: Secondary | ICD-10-CM | POA: Diagnosis not present

## 2017-05-20 DIAGNOSIS — M25551 Pain in right hip: Secondary | ICD-10-CM | POA: Diagnosis not present

## 2017-05-22 DIAGNOSIS — M545 Low back pain: Secondary | ICD-10-CM | POA: Diagnosis not present

## 2017-05-22 DIAGNOSIS — M9904 Segmental and somatic dysfunction of sacral region: Secondary | ICD-10-CM | POA: Diagnosis not present

## 2017-05-22 DIAGNOSIS — M5127 Other intervertebral disc displacement, lumbosacral region: Secondary | ICD-10-CM | POA: Diagnosis not present

## 2017-05-22 DIAGNOSIS — M5432 Sciatica, left side: Secondary | ICD-10-CM | POA: Diagnosis not present

## 2017-05-22 DIAGNOSIS — M9905 Segmental and somatic dysfunction of pelvic region: Secondary | ICD-10-CM | POA: Diagnosis not present

## 2017-05-22 DIAGNOSIS — M9903 Segmental and somatic dysfunction of lumbar region: Secondary | ICD-10-CM | POA: Diagnosis not present

## 2017-05-23 DIAGNOSIS — I890 Lymphedema, not elsewhere classified: Secondary | ICD-10-CM | POA: Diagnosis not present

## 2017-05-23 DIAGNOSIS — R2689 Other abnormalities of gait and mobility: Secondary | ICD-10-CM | POA: Diagnosis not present

## 2017-05-23 DIAGNOSIS — Z96641 Presence of right artificial hip joint: Secondary | ICD-10-CM | POA: Diagnosis not present

## 2017-05-23 DIAGNOSIS — M1711 Unilateral primary osteoarthritis, right knee: Secondary | ICD-10-CM | POA: Diagnosis not present

## 2017-05-23 DIAGNOSIS — M25562 Pain in left knee: Secondary | ICD-10-CM | POA: Diagnosis not present

## 2017-05-23 DIAGNOSIS — I10 Essential (primary) hypertension: Secondary | ICD-10-CM | POA: Diagnosis not present

## 2017-05-23 DIAGNOSIS — M25561 Pain in right knee: Secondary | ICD-10-CM | POA: Diagnosis not present

## 2017-05-23 DIAGNOSIS — M25551 Pain in right hip: Secondary | ICD-10-CM | POA: Diagnosis not present

## 2017-05-23 DIAGNOSIS — M1612 Unilateral primary osteoarthritis, left hip: Secondary | ICD-10-CM | POA: Diagnosis not present

## 2017-05-23 DIAGNOSIS — M6281 Muscle weakness (generalized): Secondary | ICD-10-CM | POA: Diagnosis not present

## 2017-05-23 DIAGNOSIS — M1712 Unilateral primary osteoarthritis, left knee: Secondary | ICD-10-CM | POA: Diagnosis not present

## 2017-05-23 DIAGNOSIS — E119 Type 2 diabetes mellitus without complications: Secondary | ICD-10-CM | POA: Diagnosis not present

## 2017-05-27 DIAGNOSIS — M6281 Muscle weakness (generalized): Secondary | ICD-10-CM | POA: Diagnosis not present

## 2017-05-27 DIAGNOSIS — E119 Type 2 diabetes mellitus without complications: Secondary | ICD-10-CM | POA: Diagnosis not present

## 2017-05-27 DIAGNOSIS — Z96641 Presence of right artificial hip joint: Secondary | ICD-10-CM | POA: Diagnosis not present

## 2017-05-27 DIAGNOSIS — M1712 Unilateral primary osteoarthritis, left knee: Secondary | ICD-10-CM | POA: Diagnosis not present

## 2017-05-27 DIAGNOSIS — M1711 Unilateral primary osteoarthritis, right knee: Secondary | ICD-10-CM | POA: Diagnosis not present

## 2017-05-27 DIAGNOSIS — R2689 Other abnormalities of gait and mobility: Secondary | ICD-10-CM | POA: Diagnosis not present

## 2017-05-27 DIAGNOSIS — I890 Lymphedema, not elsewhere classified: Secondary | ICD-10-CM | POA: Diagnosis not present

## 2017-05-27 DIAGNOSIS — I10 Essential (primary) hypertension: Secondary | ICD-10-CM | POA: Diagnosis not present

## 2017-05-27 DIAGNOSIS — M25561 Pain in right knee: Secondary | ICD-10-CM | POA: Diagnosis not present

## 2017-05-27 DIAGNOSIS — M25551 Pain in right hip: Secondary | ICD-10-CM | POA: Diagnosis not present

## 2017-05-27 DIAGNOSIS — M25562 Pain in left knee: Secondary | ICD-10-CM | POA: Diagnosis not present

## 2017-05-27 DIAGNOSIS — M1612 Unilateral primary osteoarthritis, left hip: Secondary | ICD-10-CM | POA: Diagnosis not present

## 2017-05-30 DIAGNOSIS — M25562 Pain in left knee: Secondary | ICD-10-CM | POA: Diagnosis not present

## 2017-05-30 DIAGNOSIS — Z96641 Presence of right artificial hip joint: Secondary | ICD-10-CM | POA: Diagnosis not present

## 2017-05-30 DIAGNOSIS — M6281 Muscle weakness (generalized): Secondary | ICD-10-CM | POA: Diagnosis not present

## 2017-05-30 DIAGNOSIS — M1711 Unilateral primary osteoarthritis, right knee: Secondary | ICD-10-CM | POA: Diagnosis not present

## 2017-05-30 DIAGNOSIS — M25551 Pain in right hip: Secondary | ICD-10-CM | POA: Diagnosis not present

## 2017-05-30 DIAGNOSIS — M1612 Unilateral primary osteoarthritis, left hip: Secondary | ICD-10-CM | POA: Diagnosis not present

## 2017-05-30 DIAGNOSIS — I10 Essential (primary) hypertension: Secondary | ICD-10-CM | POA: Diagnosis not present

## 2017-05-30 DIAGNOSIS — E119 Type 2 diabetes mellitus without complications: Secondary | ICD-10-CM | POA: Diagnosis not present

## 2017-05-30 DIAGNOSIS — M1712 Unilateral primary osteoarthritis, left knee: Secondary | ICD-10-CM | POA: Diagnosis not present

## 2017-05-30 DIAGNOSIS — I890 Lymphedema, not elsewhere classified: Secondary | ICD-10-CM | POA: Diagnosis not present

## 2017-05-30 DIAGNOSIS — M25561 Pain in right knee: Secondary | ICD-10-CM | POA: Diagnosis not present

## 2017-05-30 DIAGNOSIS — R2689 Other abnormalities of gait and mobility: Secondary | ICD-10-CM | POA: Diagnosis not present

## 2017-06-13 DIAGNOSIS — Z96641 Presence of right artificial hip joint: Secondary | ICD-10-CM | POA: Diagnosis not present

## 2017-06-13 DIAGNOSIS — M1712 Unilateral primary osteoarthritis, left knee: Secondary | ICD-10-CM | POA: Diagnosis not present

## 2017-06-13 DIAGNOSIS — I890 Lymphedema, not elsewhere classified: Secondary | ICD-10-CM | POA: Diagnosis not present

## 2017-06-13 DIAGNOSIS — M25562 Pain in left knee: Secondary | ICD-10-CM | POA: Diagnosis not present

## 2017-06-13 DIAGNOSIS — M1711 Unilateral primary osteoarthritis, right knee: Secondary | ICD-10-CM | POA: Diagnosis not present

## 2017-06-13 DIAGNOSIS — E119 Type 2 diabetes mellitus without complications: Secondary | ICD-10-CM | POA: Diagnosis not present

## 2017-06-13 DIAGNOSIS — I10 Essential (primary) hypertension: Secondary | ICD-10-CM | POA: Diagnosis not present

## 2017-06-13 DIAGNOSIS — M25551 Pain in right hip: Secondary | ICD-10-CM | POA: Diagnosis not present

## 2017-06-13 DIAGNOSIS — M1612 Unilateral primary osteoarthritis, left hip: Secondary | ICD-10-CM | POA: Diagnosis not present

## 2017-06-13 DIAGNOSIS — M25561 Pain in right knee: Secondary | ICD-10-CM | POA: Diagnosis not present

## 2017-06-13 DIAGNOSIS — M6281 Muscle weakness (generalized): Secondary | ICD-10-CM | POA: Diagnosis not present

## 2017-06-13 DIAGNOSIS — R2689 Other abnormalities of gait and mobility: Secondary | ICD-10-CM | POA: Diagnosis not present

## 2017-07-05 DIAGNOSIS — E119 Type 2 diabetes mellitus without complications: Secondary | ICD-10-CM | POA: Diagnosis not present

## 2017-07-05 DIAGNOSIS — E1165 Type 2 diabetes mellitus with hyperglycemia: Secondary | ICD-10-CM | POA: Diagnosis not present

## 2017-07-05 DIAGNOSIS — Z794 Long term (current) use of insulin: Secondary | ICD-10-CM | POA: Diagnosis not present

## 2017-07-17 DIAGNOSIS — E1142 Type 2 diabetes mellitus with diabetic polyneuropathy: Secondary | ICD-10-CM | POA: Diagnosis not present

## 2017-07-17 DIAGNOSIS — E7800 Pure hypercholesterolemia, unspecified: Secondary | ICD-10-CM | POA: Diagnosis not present

## 2017-07-17 DIAGNOSIS — I10 Essential (primary) hypertension: Secondary | ICD-10-CM | POA: Diagnosis not present

## 2017-07-17 DIAGNOSIS — K219 Gastro-esophageal reflux disease without esophagitis: Secondary | ICD-10-CM | POA: Diagnosis not present

## 2017-08-20 DIAGNOSIS — H04123 Dry eye syndrome of bilateral lacrimal glands: Secondary | ICD-10-CM | POA: Diagnosis not present

## 2017-08-20 DIAGNOSIS — H16101 Unspecified superficial keratitis, right eye: Secondary | ICD-10-CM | POA: Diagnosis not present

## 2017-08-20 DIAGNOSIS — E119 Type 2 diabetes mellitus without complications: Secondary | ICD-10-CM | POA: Diagnosis not present

## 2017-08-20 DIAGNOSIS — H40013 Open angle with borderline findings, low risk, bilateral: Secondary | ICD-10-CM | POA: Diagnosis not present

## 2017-09-09 DIAGNOSIS — M9905 Segmental and somatic dysfunction of pelvic region: Secondary | ICD-10-CM | POA: Diagnosis not present

## 2017-09-09 DIAGNOSIS — M9902 Segmental and somatic dysfunction of thoracic region: Secondary | ICD-10-CM | POA: Diagnosis not present

## 2017-09-09 DIAGNOSIS — M9903 Segmental and somatic dysfunction of lumbar region: Secondary | ICD-10-CM | POA: Diagnosis not present

## 2017-09-09 DIAGNOSIS — M9904 Segmental and somatic dysfunction of sacral region: Secondary | ICD-10-CM | POA: Diagnosis not present

## 2017-09-09 DIAGNOSIS — M5127 Other intervertebral disc displacement, lumbosacral region: Secondary | ICD-10-CM | POA: Diagnosis not present

## 2017-09-09 DIAGNOSIS — M545 Low back pain: Secondary | ICD-10-CM | POA: Diagnosis not present

## 2017-09-09 DIAGNOSIS — M5432 Sciatica, left side: Secondary | ICD-10-CM | POA: Diagnosis not present

## 2017-09-09 DIAGNOSIS — M546 Pain in thoracic spine: Secondary | ICD-10-CM | POA: Diagnosis not present

## 2017-09-11 DIAGNOSIS — M9902 Segmental and somatic dysfunction of thoracic region: Secondary | ICD-10-CM | POA: Diagnosis not present

## 2017-09-11 DIAGNOSIS — M546 Pain in thoracic spine: Secondary | ICD-10-CM | POA: Diagnosis not present

## 2017-09-11 DIAGNOSIS — M5432 Sciatica, left side: Secondary | ICD-10-CM | POA: Diagnosis not present

## 2017-09-11 DIAGNOSIS — M9905 Segmental and somatic dysfunction of pelvic region: Secondary | ICD-10-CM | POA: Diagnosis not present

## 2017-09-11 DIAGNOSIS — M5127 Other intervertebral disc displacement, lumbosacral region: Secondary | ICD-10-CM | POA: Diagnosis not present

## 2017-09-11 DIAGNOSIS — M545 Low back pain: Secondary | ICD-10-CM | POA: Diagnosis not present

## 2017-09-11 DIAGNOSIS — M9903 Segmental and somatic dysfunction of lumbar region: Secondary | ICD-10-CM | POA: Diagnosis not present

## 2017-09-11 DIAGNOSIS — M9904 Segmental and somatic dysfunction of sacral region: Secondary | ICD-10-CM | POA: Diagnosis not present

## 2017-09-16 DIAGNOSIS — M5432 Sciatica, left side: Secondary | ICD-10-CM | POA: Diagnosis not present

## 2017-09-16 DIAGNOSIS — M9905 Segmental and somatic dysfunction of pelvic region: Secondary | ICD-10-CM | POA: Diagnosis not present

## 2017-09-16 DIAGNOSIS — M546 Pain in thoracic spine: Secondary | ICD-10-CM | POA: Diagnosis not present

## 2017-09-16 DIAGNOSIS — M545 Low back pain: Secondary | ICD-10-CM | POA: Diagnosis not present

## 2017-09-16 DIAGNOSIS — M9903 Segmental and somatic dysfunction of lumbar region: Secondary | ICD-10-CM | POA: Diagnosis not present

## 2017-09-16 DIAGNOSIS — M9902 Segmental and somatic dysfunction of thoracic region: Secondary | ICD-10-CM | POA: Diagnosis not present

## 2017-09-16 DIAGNOSIS — M5127 Other intervertebral disc displacement, lumbosacral region: Secondary | ICD-10-CM | POA: Diagnosis not present

## 2017-09-16 DIAGNOSIS — M9904 Segmental and somatic dysfunction of sacral region: Secondary | ICD-10-CM | POA: Diagnosis not present

## 2017-09-18 DIAGNOSIS — M9903 Segmental and somatic dysfunction of lumbar region: Secondary | ICD-10-CM | POA: Diagnosis not present

## 2017-09-18 DIAGNOSIS — M9905 Segmental and somatic dysfunction of pelvic region: Secondary | ICD-10-CM | POA: Diagnosis not present

## 2017-09-18 DIAGNOSIS — M9904 Segmental and somatic dysfunction of sacral region: Secondary | ICD-10-CM | POA: Diagnosis not present

## 2017-09-18 DIAGNOSIS — M9902 Segmental and somatic dysfunction of thoracic region: Secondary | ICD-10-CM | POA: Diagnosis not present

## 2017-09-18 DIAGNOSIS — M5127 Other intervertebral disc displacement, lumbosacral region: Secondary | ICD-10-CM | POA: Diagnosis not present

## 2017-09-18 DIAGNOSIS — M5432 Sciatica, left side: Secondary | ICD-10-CM | POA: Diagnosis not present

## 2017-09-18 DIAGNOSIS — M546 Pain in thoracic spine: Secondary | ICD-10-CM | POA: Diagnosis not present

## 2017-09-18 DIAGNOSIS — M545 Low back pain: Secondary | ICD-10-CM | POA: Diagnosis not present

## 2017-09-21 DIAGNOSIS — N3001 Acute cystitis with hematuria: Secondary | ICD-10-CM | POA: Diagnosis not present

## 2017-09-23 DIAGNOSIS — M9902 Segmental and somatic dysfunction of thoracic region: Secondary | ICD-10-CM | POA: Diagnosis not present

## 2017-09-23 DIAGNOSIS — M545 Low back pain: Secondary | ICD-10-CM | POA: Diagnosis not present

## 2017-09-23 DIAGNOSIS — M546 Pain in thoracic spine: Secondary | ICD-10-CM | POA: Diagnosis not present

## 2017-09-23 DIAGNOSIS — M5432 Sciatica, left side: Secondary | ICD-10-CM | POA: Diagnosis not present

## 2017-09-23 DIAGNOSIS — M9905 Segmental and somatic dysfunction of pelvic region: Secondary | ICD-10-CM | POA: Diagnosis not present

## 2017-09-23 DIAGNOSIS — M5127 Other intervertebral disc displacement, lumbosacral region: Secondary | ICD-10-CM | POA: Diagnosis not present

## 2017-09-23 DIAGNOSIS — M9903 Segmental and somatic dysfunction of lumbar region: Secondary | ICD-10-CM | POA: Diagnosis not present

## 2017-09-23 DIAGNOSIS — M9904 Segmental and somatic dysfunction of sacral region: Secondary | ICD-10-CM | POA: Diagnosis not present

## 2017-09-25 DIAGNOSIS — M5127 Other intervertebral disc displacement, lumbosacral region: Secondary | ICD-10-CM | POA: Diagnosis not present

## 2017-09-25 DIAGNOSIS — M9904 Segmental and somatic dysfunction of sacral region: Secondary | ICD-10-CM | POA: Diagnosis not present

## 2017-09-25 DIAGNOSIS — M5432 Sciatica, left side: Secondary | ICD-10-CM | POA: Diagnosis not present

## 2017-09-25 DIAGNOSIS — M546 Pain in thoracic spine: Secondary | ICD-10-CM | POA: Diagnosis not present

## 2017-09-25 DIAGNOSIS — M545 Low back pain: Secondary | ICD-10-CM | POA: Diagnosis not present

## 2017-09-25 DIAGNOSIS — M9903 Segmental and somatic dysfunction of lumbar region: Secondary | ICD-10-CM | POA: Diagnosis not present

## 2017-09-25 DIAGNOSIS — M9905 Segmental and somatic dysfunction of pelvic region: Secondary | ICD-10-CM | POA: Diagnosis not present

## 2017-09-25 DIAGNOSIS — M9902 Segmental and somatic dysfunction of thoracic region: Secondary | ICD-10-CM | POA: Diagnosis not present

## 2017-10-02 DIAGNOSIS — M5127 Other intervertebral disc displacement, lumbosacral region: Secondary | ICD-10-CM | POA: Diagnosis not present

## 2017-10-02 DIAGNOSIS — M9902 Segmental and somatic dysfunction of thoracic region: Secondary | ICD-10-CM | POA: Diagnosis not present

## 2017-10-02 DIAGNOSIS — M545 Low back pain: Secondary | ICD-10-CM | POA: Diagnosis not present

## 2017-10-02 DIAGNOSIS — M5432 Sciatica, left side: Secondary | ICD-10-CM | POA: Diagnosis not present

## 2017-10-02 DIAGNOSIS — M9903 Segmental and somatic dysfunction of lumbar region: Secondary | ICD-10-CM | POA: Diagnosis not present

## 2017-10-02 DIAGNOSIS — M546 Pain in thoracic spine: Secondary | ICD-10-CM | POA: Diagnosis not present

## 2017-10-02 DIAGNOSIS — M9904 Segmental and somatic dysfunction of sacral region: Secondary | ICD-10-CM | POA: Diagnosis not present

## 2017-10-02 DIAGNOSIS — M9905 Segmental and somatic dysfunction of pelvic region: Secondary | ICD-10-CM | POA: Diagnosis not present

## 2017-10-04 DIAGNOSIS — E119 Type 2 diabetes mellitus without complications: Secondary | ICD-10-CM | POA: Diagnosis not present

## 2017-10-04 DIAGNOSIS — I10 Essential (primary) hypertension: Secondary | ICD-10-CM | POA: Diagnosis not present

## 2017-10-04 DIAGNOSIS — E1142 Type 2 diabetes mellitus with diabetic polyneuropathy: Secondary | ICD-10-CM | POA: Diagnosis not present

## 2017-10-04 DIAGNOSIS — E7800 Pure hypercholesterolemia, unspecified: Secondary | ICD-10-CM | POA: Diagnosis not present

## 2017-10-09 DIAGNOSIS — M546 Pain in thoracic spine: Secondary | ICD-10-CM | POA: Diagnosis not present

## 2017-10-09 DIAGNOSIS — M9904 Segmental and somatic dysfunction of sacral region: Secondary | ICD-10-CM | POA: Diagnosis not present

## 2017-10-09 DIAGNOSIS — M545 Low back pain: Secondary | ICD-10-CM | POA: Diagnosis not present

## 2017-10-09 DIAGNOSIS — M9902 Segmental and somatic dysfunction of thoracic region: Secondary | ICD-10-CM | POA: Diagnosis not present

## 2017-10-09 DIAGNOSIS — M9905 Segmental and somatic dysfunction of pelvic region: Secondary | ICD-10-CM | POA: Diagnosis not present

## 2017-10-09 DIAGNOSIS — M9903 Segmental and somatic dysfunction of lumbar region: Secondary | ICD-10-CM | POA: Diagnosis not present

## 2017-10-09 DIAGNOSIS — M5432 Sciatica, left side: Secondary | ICD-10-CM | POA: Diagnosis not present

## 2017-10-09 DIAGNOSIS — M5127 Other intervertebral disc displacement, lumbosacral region: Secondary | ICD-10-CM | POA: Diagnosis not present

## 2017-10-16 DIAGNOSIS — M9904 Segmental and somatic dysfunction of sacral region: Secondary | ICD-10-CM | POA: Diagnosis not present

## 2017-10-16 DIAGNOSIS — M546 Pain in thoracic spine: Secondary | ICD-10-CM | POA: Diagnosis not present

## 2017-10-16 DIAGNOSIS — M545 Low back pain: Secondary | ICD-10-CM | POA: Diagnosis not present

## 2017-10-16 DIAGNOSIS — M5127 Other intervertebral disc displacement, lumbosacral region: Secondary | ICD-10-CM | POA: Diagnosis not present

## 2017-10-16 DIAGNOSIS — M9905 Segmental and somatic dysfunction of pelvic region: Secondary | ICD-10-CM | POA: Diagnosis not present

## 2017-10-16 DIAGNOSIS — M9902 Segmental and somatic dysfunction of thoracic region: Secondary | ICD-10-CM | POA: Diagnosis not present

## 2017-10-16 DIAGNOSIS — M9903 Segmental and somatic dysfunction of lumbar region: Secondary | ICD-10-CM | POA: Diagnosis not present

## 2017-10-16 DIAGNOSIS — M5432 Sciatica, left side: Secondary | ICD-10-CM | POA: Diagnosis not present

## 2017-10-30 DIAGNOSIS — M5127 Other intervertebral disc displacement, lumbosacral region: Secondary | ICD-10-CM | POA: Diagnosis not present

## 2017-10-30 DIAGNOSIS — M9902 Segmental and somatic dysfunction of thoracic region: Secondary | ICD-10-CM | POA: Diagnosis not present

## 2017-10-30 DIAGNOSIS — M9904 Segmental and somatic dysfunction of sacral region: Secondary | ICD-10-CM | POA: Diagnosis not present

## 2017-10-30 DIAGNOSIS — M545 Low back pain: Secondary | ICD-10-CM | POA: Diagnosis not present

## 2017-10-30 DIAGNOSIS — M9903 Segmental and somatic dysfunction of lumbar region: Secondary | ICD-10-CM | POA: Diagnosis not present

## 2017-10-30 DIAGNOSIS — M9905 Segmental and somatic dysfunction of pelvic region: Secondary | ICD-10-CM | POA: Diagnosis not present

## 2017-10-30 DIAGNOSIS — M5432 Sciatica, left side: Secondary | ICD-10-CM | POA: Diagnosis not present

## 2017-10-30 DIAGNOSIS — M546 Pain in thoracic spine: Secondary | ICD-10-CM | POA: Diagnosis not present

## 2017-11-02 DIAGNOSIS — N3001 Acute cystitis with hematuria: Secondary | ICD-10-CM | POA: Diagnosis not present

## 2017-11-02 DIAGNOSIS — M25561 Pain in right knee: Secondary | ICD-10-CM | POA: Diagnosis not present

## 2017-11-12 DIAGNOSIS — M25569 Pain in unspecified knee: Secondary | ICD-10-CM | POA: Diagnosis not present

## 2017-11-20 DIAGNOSIS — M25461 Effusion, right knee: Secondary | ICD-10-CM | POA: Diagnosis not present

## 2017-12-19 DIAGNOSIS — N3001 Acute cystitis with hematuria: Secondary | ICD-10-CM | POA: Diagnosis not present

## 2018-02-12 DIAGNOSIS — R310 Gross hematuria: Secondary | ICD-10-CM | POA: Diagnosis not present

## 2018-02-12 DIAGNOSIS — N3021 Other chronic cystitis with hematuria: Secondary | ICD-10-CM | POA: Diagnosis not present

## 2018-02-12 DIAGNOSIS — N393 Stress incontinence (female) (male): Secondary | ICD-10-CM | POA: Diagnosis not present

## 2018-02-12 DIAGNOSIS — E6609 Other obesity due to excess calories: Secondary | ICD-10-CM | POA: Diagnosis not present

## 2018-02-26 DIAGNOSIS — Z794 Long term (current) use of insulin: Secondary | ICD-10-CM | POA: Diagnosis not present

## 2018-02-26 DIAGNOSIS — Z8639 Personal history of other endocrine, nutritional and metabolic disease: Secondary | ICD-10-CM | POA: Diagnosis not present

## 2018-02-26 DIAGNOSIS — G8929 Other chronic pain: Secondary | ICD-10-CM | POA: Diagnosis not present

## 2018-02-26 DIAGNOSIS — E119 Type 2 diabetes mellitus without complications: Secondary | ICD-10-CM | POA: Diagnosis not present

## 2018-02-26 DIAGNOSIS — I10 Essential (primary) hypertension: Secondary | ICD-10-CM | POA: Diagnosis not present

## 2018-02-26 DIAGNOSIS — N3020 Other chronic cystitis without hematuria: Secondary | ICD-10-CM | POA: Diagnosis not present

## 2018-02-26 DIAGNOSIS — R310 Gross hematuria: Secondary | ICD-10-CM | POA: Diagnosis not present

## 2018-02-26 DIAGNOSIS — M545 Low back pain: Secondary | ICD-10-CM | POA: Diagnosis not present

## 2018-02-26 DIAGNOSIS — N952 Postmenopausal atrophic vaginitis: Secondary | ICD-10-CM | POA: Diagnosis not present

## 2018-02-26 DIAGNOSIS — K219 Gastro-esophageal reflux disease without esophagitis: Secondary | ICD-10-CM | POA: Diagnosis not present

## 2018-02-26 HISTORY — PX: CYSTOSCOPY: SUR368

## 2018-02-28 ENCOUNTER — Other Ambulatory Visit: Payer: Self-pay | Admitting: Orthopedic Surgery

## 2018-02-28 DIAGNOSIS — R310 Gross hematuria: Secondary | ICD-10-CM

## 2018-03-10 ENCOUNTER — Ambulatory Visit: Admission: RE | Admit: 2018-03-10 | Payer: Self-pay | Source: Ambulatory Visit

## 2018-03-26 DIAGNOSIS — R109 Unspecified abdominal pain: Secondary | ICD-10-CM | POA: Diagnosis not present

## 2018-03-26 DIAGNOSIS — R319 Hematuria, unspecified: Secondary | ICD-10-CM | POA: Diagnosis not present

## 2018-03-26 DIAGNOSIS — N390 Urinary tract infection, site not specified: Secondary | ICD-10-CM | POA: Diagnosis not present

## 2018-04-10 DIAGNOSIS — N390 Urinary tract infection, site not specified: Secondary | ICD-10-CM | POA: Diagnosis not present

## 2018-04-10 DIAGNOSIS — R319 Hematuria, unspecified: Secondary | ICD-10-CM | POA: Diagnosis not present

## 2018-04-17 DIAGNOSIS — Z87440 Personal history of urinary (tract) infections: Secondary | ICD-10-CM | POA: Diagnosis not present

## 2018-04-17 DIAGNOSIS — R399 Unspecified symptoms and signs involving the genitourinary system: Secondary | ICD-10-CM | POA: Diagnosis not present

## 2018-04-17 DIAGNOSIS — R5381 Other malaise: Secondary | ICD-10-CM | POA: Diagnosis not present

## 2018-04-22 ENCOUNTER — Ambulatory Visit (INDEPENDENT_AMBULATORY_CARE_PROVIDER_SITE_OTHER): Payer: Medicare Other | Admitting: Nurse Practitioner

## 2018-04-22 ENCOUNTER — Encounter: Payer: Self-pay | Admitting: Nurse Practitioner

## 2018-04-22 ENCOUNTER — Other Ambulatory Visit: Payer: Self-pay

## 2018-04-22 VITALS — BP 140/70 | HR 70 | Temp 98.4°F | Ht 69.0 in | Wt 240.6 lb

## 2018-04-22 DIAGNOSIS — K219 Gastro-esophageal reflux disease without esophagitis: Secondary | ICD-10-CM | POA: Diagnosis not present

## 2018-04-22 DIAGNOSIS — N390 Urinary tract infection, site not specified: Secondary | ICD-10-CM | POA: Diagnosis not present

## 2018-04-22 DIAGNOSIS — I890 Lymphedema, not elsewhere classified: Secondary | ICD-10-CM

## 2018-04-22 DIAGNOSIS — R1011 Right upper quadrant pain: Secondary | ICD-10-CM | POA: Diagnosis not present

## 2018-04-22 DIAGNOSIS — E782 Mixed hyperlipidemia: Secondary | ICD-10-CM

## 2018-04-22 DIAGNOSIS — Z1211 Encounter for screening for malignant neoplasm of colon: Secondary | ICD-10-CM

## 2018-04-22 DIAGNOSIS — M150 Primary generalized (osteo)arthritis: Secondary | ICD-10-CM | POA: Diagnosis not present

## 2018-04-22 DIAGNOSIS — Z7689 Persons encountering health services in other specified circumstances: Secondary | ICD-10-CM

## 2018-04-22 DIAGNOSIS — E1140 Type 2 diabetes mellitus with diabetic neuropathy, unspecified: Secondary | ICD-10-CM

## 2018-04-22 DIAGNOSIS — M159 Polyosteoarthritis, unspecified: Secondary | ICD-10-CM

## 2018-04-22 DIAGNOSIS — K449 Diaphragmatic hernia without obstruction or gangrene: Secondary | ICD-10-CM

## 2018-04-22 DIAGNOSIS — M8949 Other hypertrophic osteoarthropathy, multiple sites: Secondary | ICD-10-CM

## 2018-04-22 NOTE — Progress Notes (Signed)
Subjective:    Patient ID: [name], female    DOB: [birth year], 72 y.o.   MRN: [id number]  [name] is a 72 y.o. female presenting on 04/22/2018 for Establish Care (auto immune disease , diabetes , recurrent urinary tract infection. Pt just recently relocated from [place].)   HPI [place] Provider Pt last seen by PCP 6 months ago.  Obtain records from [place].    Clinic info left with secretary per patient.  Recurrent UTIs  Has had recurrent urinary tract infections - desires new referral to other urology.  Was seen at [facility] with culture insensitive.  Patient prefers Levaquin when very sick "because it always works," so they put her on this next given her many allergies.  Cystoscopy with Dr. Yves Dill recently was normal.  Has not gotten kidney scan as he recommended.   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 Patient has had complicated course of recurrent difficulty with inflammatory disease.  Past Medical History:  Diagnosis Date  . Anterolisthesis    Cervical spine  . Asthma   . Connective tissue disorder (HCC)    recurrent carotid arteritis, temporal arteritis, vasculitis mandible, general hepatitis, avascular necrosis bilat  . DDD (degenerative disc disease), lumbar   . Diabetes mellitus without complication ([place])   . Diverticulosis   . Diverticulosis   . Duodenitis   . GERD (gastroesophageal reflux disease)   . Gouty arthritis   . Hiatal hernia   . Hiatal hernia with GERD   . Hyperlipidemia   . Hypertension   . Hyperuricemia   . Keratitis sicca, bilateral   . Lymphedema of  both lower extremities   . Osteoarthritis   . Pericarditis    Recurrent: Aug 97, July 05, Sept 08, July 16, July 18  . PUD (peptic ulcer disease)   . 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 Sjogren's syndrome ([place])   . Sjogren's syndrome ([place])   . Syncope   . Tendonitis, Achilles, right   . Tortuous colon   . Trigger finger    Past Surgical History:  Procedure Laterality Date  . ABDOMINAL HYSTERECTOMY    . BREAST BIOPSY    . CHOLECYSTECTOMY    . CYSTOSCOPY  02/26/2018   Patricia Puls, MD   . distal arthrectomy    . EXCISION NEUROMA    . KNEE SURGERY Bilateral    1985, 2001, 11/2002, 12/2006  . panhysterectomy  10/1983  . SHOULDER SURGERY Right    reverse total arthroplasty w/ biceps tenodesis  . TONSILLECTOMY AND ADENOIDECTOMY    . TOTAL HIP ARTHROPLASTY Right 04/2007  . WISDOM TOOTH EXTRACTION     Social History   Socioeconomic History  . Marital status: Divorced    Spouse name: Not on file  . Number of children: Not on file  . Years of education: Not on file  . Highest education level: Associate degree: academic program  Occupational History  . Not on file  Social Needs  . Financial resource strain: Not hard at all  . Food insecurity:    Worry: Never true    Inability: Never true  .  Transportation needs:    Medical: No    Non-medical: No  Tobacco Use  . Smoking status: Former Smoker    Last attempt to quit: 01/22/1996    Years since quitting: 22.2  . Smokeless tobacco: Never Used  Substance and Sexual Activity  . Alcohol use: Yes    Comment: occasional  . Drug use: Not on file  . Sexual activity: Not on file  Lifestyle  . Physical activity:    Days per week: Not on file    Minutes per session: Not on file  . Stress: Not on file  Relationships  . Social connections:    Talks on phone: Not on file    Gets together: Not on file    Attends religious service: Not on file    Active member of club or organization: Not on file    Attends meetings of clubs or  organizations: Not on file    Relationship status: Not on file  . Intimate partner violence:    Fear of current or ex partner: No    Emotionally abused: No    Physically abused: No    Forced sexual activity: No  Other Topics Concern  . Not on file  Social History Narrative  . Not on file   History reviewed. No pertinent family history. Current Outpatient Medications on File Prior to Visit  Medication Sig  . Cholecalciferol (VITAMIN D3) 25 MCG (1000 UT) CAPS Take by mouth.  . colchicine 0.6 MG tablet 0.6 mg every 4 (four) hours as needed  . cyclobenzaprine (FLEXERIL) 5 MG tablet Take by mouth.  Marland Kitchen glucose blood (FREESTYLE LITE) test strip Use as directed three times daily for diabetes.  Marland Kitchen glucose blood (PRECISION QID TEST) test strip Use as directed twice daily for diabetes monitoring.  . insulin NPH-regular Human (70-30) 100 UNIT/ML injection Inject 52 Units into the skin. In the am and 50 uits in the PM plus Humalog PRN  . Insulin Syringe-Needle U-100 (INSULIN SYRINGE .5CC/31GX5/16") 31G X 5/16" 0.5 ML MISC Inject x3/day  . lactase (LACTAID) 3000 units tablet Take by mouth.  . Lancets (FREESTYLE) lancets Must test x4/day  . nebivolol (BYSTOLIC) 10 MG tablet Take by mouth.  . prednisoLONE acetate (PRED FORTE) 1 % ophthalmic suspension INSTILL ONE DROP  as needed  . ranitidine (ZANTAC) 300 MG tablet Take by mouth.  . rosuvastatin (CRESTOR) 10 MG tablet Take by mouth.  Marland Kitchen glucose blood (FREESTYLE LITE) test strip Use as directed three times daily for diabetes.   No current facility-administered medications on file prior to visit.     Review of Systems  Constitutional: Negative for activity change, appetite change, fatigue and unexpected weight change.  Eyes: Negative for pain and visual disturbance.  Respiratory: Negative for cough, chest tightness and shortness of breath.   Cardiovascular: Negative for chest pain, palpitations and leg swelling.  Gastrointestinal: Negative for  abdominal pain, blood in stool, constipation, diarrhea, nausea and vomiting.  Endocrine: Positive for heat intolerance. Negative for cold intolerance, polydipsia, polyphagia and polyuria.  Genitourinary: Positive for difficulty urinating, dysuria and hematuria. Negative for frequency.  Musculoskeletal: Positive for arthralgias and myalgias. Negative for gait problem and neck stiffness.  Skin: Negative for rash.  Neurological: Negative for dizziness, speech difficulty, weakness, light-headedness, numbness and headaches.  Hematological: Negative for adenopathy.  Psychiatric/Behavioral: Negative for dysphoric mood and sleep disturbance. The patient is not nervous/anxious.    Per HPI unless specifically indicated above    Objective:    BP 140/70 (  BP Location: Left Arm, Patient Position: Sitting, Cuff Size: Normal)   Pulse 70   Temp 98.4 F (36.9 C) (Oral)   Ht 5\' 9"  (1.753 m)   Wt 240 lb 9.6 oz (109.1 kg)   SpO2 100%   BMI 35.53 kg/m   Wt Readings from Last 3 Encounters:  04/22/18 240 lb 9.6 oz (109.1 kg)    Physical Exam  Constitutional: She is oriented to person, place, and time. She appears well-developed and well-nourished. No distress.  HENT:  Head: Normocephalic and atraumatic.  Cardiovascular: Normal rate, regular rhythm, S1 normal, S2 normal, normal heart sounds and intact distal pulses.  Pulmonary/Chest: Effort normal and breath sounds normal. No respiratory distress.  Abdominal: Soft. Bowel sounds are normal. She exhibits no distension. There is no hepatosplenomegaly. There is no tenderness. No hernia.  Musculoskeletal: She exhibits edema (+3 pitting edema BLE).  Neurological: She is alert and oriented to person, place, and time. She has normal strength and normal reflexes. No cranial nerve deficit or sensory deficit. She displays a negative Romberg sign. Gait normal.  Skin: Skin is warm and dry. Capillary refill takes less than 2 seconds.  Psychiatric: Her behavior is  normal. Her mood appears anxious. Her speech is rapid and/or pressured and tangential. Cognition and memory are normal.  Vitals reviewed.     Assessment & Plan:   Problem List Items Addressed This Visit      Respiratory   Hiatal hernia with GERD Patient reports stable symptoms.  Requests GI referral, which is provided for regular follow-up.  Follow-up prn.   Relevant Medications   ranitidine (ZANTAC) 300 MG tablet   lactase (LACTAID) 3000 units tablet     Endocrine   Controlled type 2 diabetes with neuropathy ([place]) Previously uncontrolled.  No recent A1c.  Patient taking duplicative insulins with 70/30 and lispro.   Complicated by neuropathy and hyperlipidemia. - Instructed patient to use extreme caution.  Should transition to basal-bolus for best control in future. - Follow-up 4 weeks for detailed review.   Relevant Medications   insulin NPH-regular Human (70-30) 100 UNIT/ML injection   rosuvastatin (CRESTOR) 10 MG tablet   insulin lispro (HUMALOG) 100 UNIT/ML injection     Musculoskeletal and Integument   Osteoarthritis Chronic and affects multiple joints.  Also has gouty arthritis without significant joint fusions. Follow-up prn.  Continue flexeril prn.   Relevant Medications   cyclobenzaprine (FLEXERIL) 5 MG tablet   colchicine 0.6 MG tablet     Other   Hyperlipidemia Stable in past - needs lab recollection with DM exam.  Follow-up 4 weeks.    Relevant Medications   nebivolol (BYSTOLIC) 10 MG tablet   rosuvastatin (CRESTOR) 10 MG tablet   Lymphedema of both lower extremities Stable, chronic condition without control today.  No regular use of compression socks or lymph pump.  May benefit in future from vascular referral.  FOLLOW-UP prn.    Other Visit Diagnoses    Colon cancer screening    -  Primary Patient desires colon cancer screen with cologuard - order placed.   Relevant Orders   Cologuard   Encounter to establish care     Previous PCP was at [place] with  Dr. Katharina Caper at [facility] physicians [phone] (ph) [phone] (fax).  Records will be requested.  Past medical, family, and surgical history reviewed w/ pt.     Recurrent UTI     Patient has seen local urgent care and urology for recurrent UTI.  Did not  wish to continue care with Dr. Boneta Lucks.  Requests alternative referral.   - Referral placed for BUA - Recommended patient consider completing renal CT as recommended. - Follow-up prn.   Relevant Orders   Ambulatory referral to Urology   Continuous RUQ abdominal pain     Patient continues having RUQ pain and is most likely related to chronic hiatal hernia/GERD, duodenitis.  Patient with frequent imaging in past and is known s/p cholecystectomy.    Plan: 1. RUQ [place] eval liver, bile duct. 2. Consider followup with GI. Referral declined today 3. Follow-up prn.   Relevant Orders   US Abdomen Limited RUQ (Completed)      Follow up plan: Return in about 4 weeks (around 05/20/2018) for diabetes.  A total of 65 minutes was spent face-to-face with this patient. Greater than 50% of this time was spent in counseling and coordination of care with the patient as above in AP and for detailed history review by patient.  Cassell Smiles, DNP, AGPCNP-BC Adult Gerontology Primary Care Nurse Practitioner [facility] 04/22/2018, 3:50 PM

## 2018-04-22 NOTE — Patient Instructions (Addendum)
[name],   Thank you for coming in to clinic today.  1. Your provider would like to you have your annual eye exam. Please contact your current eye doctor or here are some good options for you to contact.   [facility]   Address: [address] [place], [place] [address] Phone: [phone]  Website: [email or web address], [place], [place] [address] Phone: [phone] [email or web address]  [facility]  Address: [place], [place], [place] [address] Phone: [phone]   [facility] [address] [place], [place] [address] Phone: [phone]  [facility] Address: [place], [place], [place] [address]  Phone: [phone]  2. [facility] will call you to establish care.  3. For your right upper quadrant pain, you will have an ultrasound.  They, will look at pancreas, common bile duct, liver. [facility] will call to schedule this for you.   Please schedule a follow-up appointment with Cassell Smiles, AGNP. Return in about 4 weeks (around 05/20/2018) for diabetes.  If you have any other questions or concerns, please feel free to call the clinic or send a message through [place]. You may also schedule an earlier appointment if necessary.  You will receive a survey after today's visit either digitally by e-mail or paper by [employer]. Your experiences and feedback matter to [place].  Please respond so we know how we are doing as we provide care for you.   Cassell Smiles, DNP, AGNP-BC Adult Gerontology Nurse Practitioner [place]

## 2018-04-24 ENCOUNTER — Ambulatory Visit
Admission: RE | Admit: 2018-04-24 | Discharge: 2018-04-24 | Disposition: A | Discharge status: Home / Self Care | Payer: Medicare Other | Source: Ambulatory Visit | Attending: Nurse Practitioner | Admitting: Nurse Practitioner

## 2018-04-24 ENCOUNTER — Ambulatory Visit: Payer: Medicare Other

## 2018-04-24 DIAGNOSIS — R1011 Right upper quadrant pain: Secondary | ICD-10-CM

## 2018-04-24 DIAGNOSIS — Z9049 Acquired absence of other specified parts of digestive tract: Secondary | ICD-10-CM | POA: Insufficient documentation

## 2018-04-28 ENCOUNTER — Telehealth: Payer: Self-pay

## 2018-04-28 NOTE — Telephone Encounter (Signed)
The pt called requesting [place] results. Please advise

## 2018-04-28 NOTE — Telephone Encounter (Signed)
Results are reviewed and are normal.  Please call patient to inform her.

## 2018-04-29 ENCOUNTER — Encounter: Payer: Self-pay | Admitting: Nurse Practitioner

## 2018-04-29 DIAGNOSIS — M1990 Unspecified osteoarthritis, unspecified site: Secondary | ICD-10-CM | POA: Insufficient documentation

## 2018-04-29 DIAGNOSIS — E785 Hyperlipidemia, unspecified: Secondary | ICD-10-CM | POA: Insufficient documentation

## 2018-04-29 DIAGNOSIS — I890 Lymphedema, not elsewhere classified: Secondary | ICD-10-CM | POA: Insufficient documentation

## 2018-04-29 DIAGNOSIS — K219 Gastro-esophageal reflux disease without esophagitis: Secondary | ICD-10-CM | POA: Insufficient documentation

## 2018-04-29 DIAGNOSIS — E1140 Type 2 diabetes mellitus with diabetic neuropathy, unspecified: Secondary | ICD-10-CM

## 2018-04-29 DIAGNOSIS — K449 Diaphragmatic hernia without obstruction or gangrene: Secondary | ICD-10-CM | POA: Insufficient documentation

## 2018-04-29 NOTE — Addendum Note (Signed)
Addended by: Cassell Smiles R on: 04/29/2018 01:55 PM   Modules accepted: Orders

## 2018-05-05 ENCOUNTER — Telehealth: Payer: Self-pay | Admitting: Nurse Practitioner

## 2018-05-05 DIAGNOSIS — I10 Essential (primary) hypertension: Secondary | ICD-10-CM

## 2018-05-05 MED ORDER — NEBIVOLOL HCL 20 MG PO TABS: 20.0000 mg | ORAL_TABLET | Freq: Every day | ORAL | 1 refills | Status: DC

## 2018-05-05 NOTE — Telephone Encounter (Signed)
Will go ahead and increase nebivolol to 20 mg once daily. 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

## 2018-05-05 NOTE — Telephone Encounter (Signed)
Elevated BP yesterday was in evening was after first dose, and took a second pill.  Usually takes in evenings. - Felt "off balance"  Now has had history of BP increase, "but never this high." Then has problems with hypotension if over-treated.  Has headache today and "feels like somebody beat me."   Now is 178/105.  Has not yet taken any other Bystolic.  - Admits she is under a lot of stress currently.

## 2018-05-05 NOTE — Telephone Encounter (Signed)
The pt states shes going to go ahead and increase her medication, because it's ridiculous that we are going to let her walk around with a blood pressure of 200/100. The pt have not taken the Bystolic today, but reports that her blood pressure was >150/80. She stated that she's already told you that Belva Bertin advise her to only take Bystolic and no other  bp medications work for her.  I advised her that Lauren wanted her to monitor her blood pressure to see if it remains elevated, for 1-3 days, but the patient was very argumentative.Stating she was just going to increase it.

## 2018-05-05 NOTE — Telephone Encounter (Signed)
Pt called said that her  BP was 200/100  Took extra BP  Medication retook BP at   4 AM 136/76,  At  9:00am 154/86. Pt wanted to know what she need to do

## 2018-05-05 NOTE — Telephone Encounter (Signed)
Kelita, Please triage.    Has patient taken med again today yet?  IF not, should take one dose daily at normal time.  - Has patient ever taken any other meds for hypertension that are not listed in allergy list?  If persistent BP > 140/80 over 1-3 days, will increase nebivolol to 20 mg once daily and continue daily for future. No increase at this time.

## 2018-05-12 ENCOUNTER — Telehealth: Payer: Self-pay | Admitting: Nurse Practitioner

## 2018-05-12 NOTE — Telephone Encounter (Signed)
error 

## 2018-05-21 DIAGNOSIS — Z1212 Encounter for screening for malignant neoplasm of rectum: Secondary | ICD-10-CM | POA: Diagnosis not present

## 2018-05-21 DIAGNOSIS — Z1211 Encounter for screening for malignant neoplasm of colon: Secondary | ICD-10-CM | POA: Diagnosis not present

## 2018-05-22 ENCOUNTER — Ambulatory Visit (INDEPENDENT_AMBULATORY_CARE_PROVIDER_SITE_OTHER): Payer: Medicare Other | Admitting: Nurse Practitioner

## 2018-05-22 ENCOUNTER — Other Ambulatory Visit: Payer: Self-pay

## 2018-05-22 ENCOUNTER — Telehealth: Payer: Self-pay | Admitting: Nurse Practitioner

## 2018-05-22 VITALS — BP 136/58 | HR 68 | Temp 98.5°F | Ht 69.0 in | Wt 239.2 lb

## 2018-05-22 DIAGNOSIS — K219 Gastro-esophageal reflux disease without esophagitis: Secondary | ICD-10-CM | POA: Diagnosis not present

## 2018-05-22 DIAGNOSIS — Z1159 Encounter for screening for other viral diseases: Secondary | ICD-10-CM | POA: Diagnosis not present

## 2018-05-22 DIAGNOSIS — E1140 Type 2 diabetes mellitus with diabetic neuropathy, unspecified: Secondary | ICD-10-CM

## 2018-05-22 DIAGNOSIS — R0989 Other specified symptoms and signs involving the circulatory and respiratory systems: Secondary | ICD-10-CM

## 2018-05-22 DIAGNOSIS — K449 Diaphragmatic hernia without obstruction or gangrene: Secondary | ICD-10-CM | POA: Diagnosis not present

## 2018-05-22 DIAGNOSIS — E782 Mixed hyperlipidemia: Secondary | ICD-10-CM

## 2018-05-22 DIAGNOSIS — I6523 Occlusion and stenosis of bilateral carotid arteries: Secondary | ICD-10-CM

## 2018-05-22 LAB — POCT GLYCOSYLATED HEMOGLOBIN (HGB A1C): Hemoglobin A1C: 7.6 % — AB (ref 4.0–5.6)

## 2018-05-22 LAB — COLOGUARD: COLOGUARD: POSITIVE

## 2018-05-22 MED ORDER — INSULIN LISPRO 100 UNIT/ML ~~LOC~~ SOLN: SUBCUTANEOUS | 2 refills | Status: DC

## 2018-05-22 MED ORDER — PEN NEEDLES 32G X 5 MM MISC: 1.0000 | Freq: Every day | 3 refills | Status: DC

## 2018-05-22 MED ORDER — INSULIN DEGLUDEC 100 UNIT/ML ~~LOC~~ SOPN: 56.0000 [IU] | PEN_INJECTOR | Freq: Every day | SUBCUTANEOUS | 2 refills | Status: DC

## 2018-05-22 NOTE — Telephone Encounter (Signed)
Pt needs pin needles for tresiba sent to [facility]

## 2018-05-22 NOTE — Progress Notes (Signed)
Subjective:    Patient ID: [name], female    DOB: February 24, 1946, 72 y.o.   MRN: [id number]  [name] is a 72 y.o. female presenting on 05/22/2018 for Diabetes  HPI Diabetes Pt presents today for follow up of Type 2 diabetes mellitus. She is checking fasting am CBG at home with a range of 70-140 - Current diabetic medications include: 70/30 50 units in am and 52 units in pm, humalog prn for correction scale - She is not currently symptomatic.  - She denies polydipsia, polyphagia, polyuria, headaches, diaphoresis, shakiness, chills and changes in vision.   - Clinical course has been stable. - She  reports no regular exercise routine. - Her diet is moderate in salt, moderate in fat, and moderate in carbohydrates. - Weight trend: stable  PREVENTION: Eye exam current (within one year): no Foot exam current (within one year): no Lipid/ASCVD risk reduction - on statin: yes Kidney protection - on ace or arb: needs microalbumin Recent Labs    05/22/18 1408  HGBA1C 7.6*   Social History   Tobacco Use  . Smoking status: Former Smoker    Last attempt to quit: 01/22/1996    Years since quitting: 22.3  . Smokeless tobacco: Never Used  Substance Use Topics  . Alcohol use: Yes    Comment: occasional  . Drug use: Not on file    Review of Systems Per HPI unless specifically indicated above     Objective:    BP (!) 136/58 (BP Location: Left Arm, Patient Position: Sitting, Cuff Size: Large)   Pulse 68   Temp 98.5 F (36.9 C) (Oral)   Ht 5\' 9"  (1.753 m)   Wt 239 lb 3.2 oz (108.5 kg)   BMI 35.32 kg/m   Wt Readings from Last 3 Encounters:  05/22/18 239 lb 3.2 oz (108.5 kg)  04/22/18 240 lb 9.6 oz (109.1 kg)    Physical Exam Vitals signs reviewed.  Constitutional:      General: She is not in acute distress.    Appearance: She is well-developed.  HENT:     Head: Normocephalic and atraumatic.  Neck:     Musculoskeletal: Normal range of motion and neck supple.    Cardiovascular:     Rate and Rhythm: Normal rate and regular rhythm.     Pulses:          Carotid pulses are on the right side with bruit.    Heart sounds: Normal heart sounds, S1 normal and S2 normal.  Pulmonary:     Effort: Pulmonary effort is normal. No respiratory distress.     Breath sounds: Normal breath sounds.  Skin:    General: Skin is warm and dry.     Capillary Refill: Capillary refill takes less than 2 seconds.  Neurological:     General: No focal deficit present.     Mental Status: She is alert and oriented to person, place, and time. Mental status is at baseline.  Psychiatric:        Attention and Perception: Attention normal.        Mood and Affect: Mood and affect normal.        Speech: Speech is rapid and pressured.        Behavior: Behavior normal.        Thought Content: Thought content normal.    Diabetic Foot Form - Detailed   Diabetic Foot Exam - detailed Diabetic Foot exam was performed with the following findings:  Yes 05/22/2018  1:12  PM  Visual Foot Exam completed.:  Yes  Can the patient see the bottom of their feet?:  No Are the shoes appropriate in style and fit?:  No Is there swelling or and abnormal foot shape?:  Yes Is there a claw toe deformity?:  No Is there elevated skin temparature?:  No Is there foot or ankle muscle weakness?:  Yes Normal Range of Motion:  Yes Pulse Foot Exam completed.:  Yes  Right posterior Tibialias:  Present Left posterior Tibialias:  Present  Right Dorsalis Pedis:  Present Left Dorsalis Pedis:  Present  Sensory Foot Exam Completed.:  Yes Semmes-Weinstein Monofilament Test       Results for orders placed or performed in visit on 05/22/18  POCT glycosylated hemoglobin (Hb A1C)  Result Value Ref Range   Hemoglobin A1C 7.6 (A) 4.0 - 5.6 %   HbA1c POC (<> result, manual entry)     HbA1c, POC (prediabetic range)     HbA1c, POC (controlled diabetic range)        Assessment & Plan:   Problem List Items Addressed  This Visit      Respiratory   Hiatal hernia with GERD Patient with persistent RUQ abdominal pain, neg RUQ [place]. Patient awaiting GI appointment. - Labs today for CBC. - Follow-up with GI.   Relevant Orders   CBC with Differential/Platelet     Endocrine   Controlled type 2 diabetes with neuropathy ([place]) - Primary Controlled T2DM with A1c < 8%, but still higher than goal A1c < 7.0%. - Complications - peripheral neuropathy, hyperglycemia and hypoglycemia.  Plan:  1. Change therapy:   STOP 70/30 insulin (if [place] covered by insurance) - START tresiba 56 units daily once - START Humalog meal dosing (new) and continue current correction scale.  [place] 10 units plus correction with every meal (tid).  Patient verbalizes understanding 2. Encourage improved lifestyle: - low carb/low glycemic diet reinforced prior education - Increase physical activity to 30 minutes most days of the week.  Explained that increased physical activity increases body's use of sugar for energy. 3. Check fasting am CBG, pre lunch, and pre dinner CBGs and bring log to next visit for review 4. Labs today 5. DM Foot exam done today with abnormal findings.   and Advised to schedule DM ophtho exam, send record. 6. Follow-up 6 weeks.    Relevant Medications   insulin lispro (HUMALOG) 100 UNIT/ML injection   insulin degludec (TRESIBA FLEXTOUCH) 100 UNIT/ML SOPN FlexTouch Pen   Other Relevant Orders   POCT glycosylated hemoglobin (Hb A1C) (Completed)   COMPLETE METABOLIC PANEL WITH GFR   Lipid panel   POCT UA - Microalbumin     Other   Hyperlipidemia Current status unknown.  Lipid panel today.  New RIGHT carotid bruit noted.  Patient with last carotid US > 3 years ago.  Reordered today.  Recheck labs.  Continue statin.  Follow-up after labs and in 6 weeks.   Relevant Orders   Lipid panel   TSH   T4, free   US Carotid Duplex Bilateral    Other Visit Diagnoses    Encounter for hepatitis C screening test for low  risk patient     Patient due for hep C screen again.  Lab ordered.  No results available in care everywhere.    Right carotid bruit     See hyperlipidemia above.   Relevant Orders   US Carotid Duplex Bilateral      Meds ordered this encounter  Medications  .  insulin lispro (HUMALOG) 100 UNIT/ML injection    Sig: Administer 10 units at meals and 1 unit per 20 over 140 (up to 50 units per day total).    Dispense:  20 mL    Refill:  2    Order Specific Question:   Supervising Provider    Answer:   Olin Hauser [[id number]]  . insulin degludec (TRESIBA FLEXTOUCH) 100 UNIT/ML SOPN FlexTouch Pen    Sig: Inject 0.56 mLs (56 Units total) into the skin daily.    Dispense:  6 pen    Refill:  2    Order Specific Question:   Supervising Provider    Answer:   Olin Hauser [[id number]]    Follow up plan: Return in about 6 weeks (around 07/03/2018) for diabetes.  Cassell Smiles, DNP, AGPCNP-BC Adult Gerontology Primary Care Nurse Practitioner [facility] 05/22/2018, 2:06 PM

## 2018-05-22 NOTE — Telephone Encounter (Signed)
Pen needles are sent.

## 2018-05-22 NOTE — Patient Instructions (Addendum)
[name],   Thank you for coming in to clinic today.  1. Once you get your Tresiba, STOP 70/30 insulin. - Take Tresiba 56 units once daily. - Take Humalog 10 units plus your correction scale of 1 unit per 20 over 140.  If CBG regularly over 130 in morning, call clinic before your next appointment.  2. Continue with correlation of BP machine here in clinic.  Schedule a CMA visit for this.  3. Increase physical activity to 30 minutes most days of the week.  You will be due for [place].  This means you should eat no food or drink after midnight.  Drink only water or coffee without cream/sugar on the morning of your lab visit. - Please go ahead and schedule a "Lab Only" visit in the morning at the clinic for lab draw in the next 7 days.  Come to clinic between 8-11:45. - Your results will be available about 2-3 days after blood draw.  If you have set up a MyChart account, you can can log in to MyChart online to view your results and a brief explanation. Also, we can discuss your results together at your next office visit if you would like.   Please schedule a follow-up appointment with Cassell Smiles, AGNP. Return in about 6 weeks (around 07/03/2018) for diabetes.  If you have any other questions or concerns, please feel free to call the clinic or send a message through [place]. You may also schedule an earlier appointment if necessary.  You will receive a survey after today's visit either digitally by e-mail or paper by [employer]. Your experiences and feedback matter to [place].  Please respond so we know how we are doing as we provide care for you.   Cassell Smiles, DNP, AGNP-BC Adult Gerontology Nurse Practitioner [place]

## 2018-05-22 NOTE — Telephone Encounter (Signed)
Incoming

## 2018-05-25 ENCOUNTER — Encounter: Payer: Self-pay | Admitting: Nurse Practitioner

## 2018-05-26 ENCOUNTER — Encounter: Payer: Self-pay | Admitting: Gastroenterology

## 2018-05-26 ENCOUNTER — Other Ambulatory Visit: Payer: Self-pay | Admitting: Nurse Practitioner

## 2018-05-26 ENCOUNTER — Ambulatory Visit (INDEPENDENT_AMBULATORY_CARE_PROVIDER_SITE_OTHER): Payer: Medicare Other | Admitting: Gastroenterology

## 2018-05-26 ENCOUNTER — Other Ambulatory Visit: Payer: Medicare Other

## 2018-05-26 ENCOUNTER — Telehealth: Payer: Self-pay

## 2018-05-26 VITALS — BP 193/76 | HR 75 | Ht 69.0 in | Wt 238.0 lb

## 2018-05-26 DIAGNOSIS — Z9189 Other specified personal risk factors, not elsewhere classified: Secondary | ICD-10-CM

## 2018-05-26 DIAGNOSIS — K219 Gastro-esophageal reflux disease without esophagitis: Secondary | ICD-10-CM | POA: Diagnosis not present

## 2018-05-26 DIAGNOSIS — E1140 Type 2 diabetes mellitus with diabetic neuropathy, unspecified: Secondary | ICD-10-CM | POA: Diagnosis not present

## 2018-05-26 DIAGNOSIS — R109 Unspecified abdominal pain: Secondary | ICD-10-CM | POA: Diagnosis not present

## 2018-05-26 DIAGNOSIS — E782 Mixed hyperlipidemia: Secondary | ICD-10-CM | POA: Diagnosis not present

## 2018-05-26 DIAGNOSIS — K449 Diaphragmatic hernia without obstruction or gangrene: Secondary | ICD-10-CM | POA: Diagnosis not present

## 2018-05-26 NOTE — Telephone Encounter (Signed)
Order already added as b.[place] [facility]

## 2018-05-26 NOTE — Telephone Encounter (Signed)
Please added on Lyme disease order for the patient.

## 2018-05-26 NOTE — Progress Notes (Signed)
Gastroenterology Consultation  Referring Provider:     Mikey College, * Primary Care Physician:  Mikey College, NP Primary Gastroenterologist:  Dr. Allen Norris     Reason for Consultation:     Right-sided Abdominal pain        HPI:   [name] is a 72 y.o. y/o female referred for consultation & management of Right-sided abdominal pain by Dr. Merrilyn Puma, Jerrel Ivory, NP.  This patient comes in today with a very complicated and long medical history including multiple GI issues including repeated colonoscopies with inability to pass the scope through the colon requiring the patient to undergo to virtual colonoscopies in the past.  The patient also has a history of an upper endoscopy showing her to have a mass in the stomach which did not show up on a CT scan and was not evident on endoscopic ultrasound.  The patient then underwent another EGD and was found to not have a mass in her stomach.  The patient had all this work done at [facility] in [place].  The patient also reports that she has chronic back pain for which she sees a Restaurant manager, fast food.  She states that at one time she was going to undergo surgery for abdominal distention and bloating and went to her chiropractor who manipulated her and she states that he freed up a stuck ileocecal valve through the manipulation.  She then credits not having to undergo any surgery because of this.  The patient now comes in with a report of right-sided abdominal pain that is in a bandlike fashion on the right middle abdomen.  It is not associated with nausea vomiting fevers chills diarrhea or constipation.  It is not made better or worse with eating or defecating.  She also denies the symptoms to be associated with lack stools or bloody stools.  Past Medical History:  Diagnosis Date  . Anterolisthesis    Cervical spine  . Asthma   . Connective tissue disorder (HCC)    recurrent carotid arteritis, temporal arteritis, vasculitis  mandible, general hepatitis, avascular necrosis bilat  . DDD (degenerative disc disease), lumbar   . Diabetes mellitus without complication ([place])   . Diverticulosis   . Diverticulosis   . Duodenitis   . Gouty arthritis   . Hiatal hernia with GERD   . Hyperlipidemia   . Hypertension   . Hyperuricemia   . Keratitis sicca, bilateral   . Lymphedema of both lower extremities   . Osteoarthritis   . Pericarditis    Recurrent: Aug 97, July 05, Sept 08, July 16, July 18  . PUD (peptic ulcer disease)   . 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 Sjogren's syndrome ([place])   . Sjogren's syndrome ([place])   . Syncope   . Tendonitis, Achilles, right   . Tortuous colon   . Trigger finger     Past Surgical History:  Procedure Laterality Date  . ABDOMINAL HYSTERECTOMY    . BREAST BIOPSY    . CHOLECYSTECTOMY    . CYSTOSCOPY  02/26/2018   [name], MD   . distal arthrectomy    . EXCISION NEUROMA    . KNEE SURGERY Bilateral    1985, 2001, 11/2002, 12/2006  . panhysterectomy  10/1983  . SHOULDER SURGERY Right    reverse total arthroplasty w/ biceps tenodesis  . TONSILLECTOMY AND ADENOIDECTOMY    . TOTAL HIP ARTHROPLASTY Right 04/2007  . WISDOM TOOTH EXTRACTION      Prior to Admission medications  Medication Sig Start Date End Date Taking? Authorizing Provider  Cholecalciferol (VITAMIN D3) 25 MCG (1000 UT) CAPS Take by mouth.    [provider]  colchicine 0.6 MG tablet 0.6 mg every 4 (four) hours as needed 03/06/16   [provider]  cyclobenzaprine (FLEXERIL) 5 MG tablet Take by mouth. 09/26/17   [provider]  glucose blood (FREESTYLE LITE) test strip Use as directed three times daily for diabetes. 11/22/15   [provider]  glucose blood (FREESTYLE LITE) test strip Use as directed three times daily for diabetes. 11/22/15   [provider]  glucose blood (PRECISION QID TEST) test strip Use as directed twice daily for diabetes monitoring. 04/09/17    [provider]  insulin degludec (TRESIBA FLEXTOUCH) 100 UNIT/ML SOPN FlexTouch Pen Inject 0.56 mLs (56 Units total) into the skin daily. 05/22/18   Mikey College, NP  insulin lispro (HUMALOG) 100 UNIT/ML injection Administer 10 units at meals and 1 unit per 20 over 140 (up to 50 units per day total). 05/22/18   Mikey College, NP  Insulin Pen Needle (PEN NEEDLES) 32G X 5 MM MISC 1 Device by Does not apply route daily. 05/22/18   Mikey College, NP  Insulin Syringe-Needle U-100 (INSULIN SYRINGE .5CC/31GX5/16") 31G X 5/16" 0.5 ML MISC Inject x3/day 03/01/17   [provider]  lactase (LACTAID) 3000 units tablet Take by mouth.    [provider]  Lancets (FREESTYLE) lancets Must test x4/day 06/19/16   [provider]  nebivolol 20 MG TABS Take 1 tablet (20 mg total) by mouth daily. 05/05/18 05/05/19  Mikey College, NP  prednisoLONE acetate (PRED FORTE) 1 % ophthalmic suspension INSTILL ONE DROP  as needed 08/05/15   [provider]  ranitidine (ZANTAC) 300 MG tablet Take by mouth. 09/24/17   [provider]  rosuvastatin (CRESTOR) 10 MG tablet Take by mouth. 12/30/17 02/26/19  [provider]    No family history on file.   Social History   Tobacco Use  . Smoking status: Former Smoker    Last attempt to quit: 01/22/1996    Years since quitting: 22.3  . Smokeless tobacco: Never Used  Substance Use Topics  . Alcohol use: Yes    Comment: occasional  . Drug use: Not on file    Allergies as of 05/26/2018 - Review Complete 05/25/2018  Allergen Reaction Noted  . Amlodipine  08/02/2014  . Aspirin  02/26/2018  . Bee venom  08/02/2014  . Betadine [povidone iodine]  04/22/2018  . Ciprofloxacin  07/25/2014  . Codeine  08/02/2014  . Erythromycin  07/25/2014  . Glimepiride  08/02/2014  . Hydralazine  11/28/2016  . Hydrochlorothiazide  02/21/2016  . Hydrocodone  02/26/2018  . Hydrocodone-acetaminophen Nausea  And Vomiting 03/11/2017  . Hydromorphone Nausea And Vomiting 03/11/2017  . Iohexol  10/24/2016  . Irbesartan  08/02/2014  . Irbesartan-hydrochlorothiazide  08/02/2014  . Lisinopril  08/02/2014  . Maxitrol [neomycin-polymyxin-dexameth]  04/22/2018  . Metformin and related  04/22/2018  . Metformin hcl  02/26/2018  . Methylcellulose  02/26/2018  . Metoclopramide Nausea And Vomiting 08/02/2014  . Metoprolol  08/02/2014  . Neomycin-bacitracin zn-polymyx  03/11/2017  . Norvasc [amlodipine besylate]  04/22/2018  . Nsaids Other (See Comments) 07/25/2014  . Omeprazole magnesium  02/26/2018  . Other  08/02/2014  . Oxycodone-acetaminophen  07/25/2014  . Penicillins  07/25/2014  . Pioglitazone  08/02/2014  . Poison sumac extract  07/27/2014  . Povidone-iodine  08/02/2014  .  Prilosec [omeprazole]  04/22/2018  . Silver sulfadiazine  07/27/2014  . Sulfa antibiotics  07/25/2014  . Tape  07/27/2014  . Tetanus toxoid Other (See Comments) 05/11/2015  . Tramadol  02/26/2018    Review of Systems:    All systems reviewed and negative except where noted in HPI.   Physical Exam:  There were no vitals taken for this visit. No LMP recorded. General:   Alert,  Well-developed, well-nourished, pleasant and cooperative in NAD Head:  Normocephalic and atraumatic. Eyes:  Sclera clear, no icterus.   Conjunctiva pink. Ears:  Normal auditory acuity. Nose:  No deformity, discharge, or lesions. Mouth:  No deformity or lesions,oropharynx pink & moist. Neck:  Supple; no masses or thyromegaly. Lungs:  Respirations even and unlabored.  Clear throughout to auscultation.   No wheezes, crackles, or rhonchi. No acute distress. Heart:  Regular rate and rhythm; no murmurs, clicks, rubs, or gallops. Abdomen:  Normal bowel sounds.  No bruits.  Soft, Positive tenderness to one figure palpation while flexing the abdominal wall muscles and non-distended without masses, hepatosplenomegaly or hernias noted.  No guarding or  rebound tenderness.  Positive Carnett sign.   Rectal:  Deferred.  Msk:  Symmetrical without gross deformities.  Good, equal movement & strength bilaterally. Pulses:  Normal pulses noted. Extremities:  No clubbing or edema.  No cyanosis. Neurologic:  Alert and oriented x3;  grossly normal neurologically. Skin:  Intact without significant lesions or rashes.  No jaundice. Lymph Nodes:  No significant cervical adenopathy. Psych:  Alert and cooperative. Normal mood and affect.  Imaging Studies: No results found.  Assessment and Plan:   Zykera Abella is a 72 y.o. y/o female Who comes in today with abdominal pain is clearly musculoskeletal and reproducible by flexing the abdominal wall muscles.  The patient's symptoms are not consistent with a GI problems since there is no association with any GI Functions, or made better or worse with eating or drinking or defecating.  The patient is concerned whether she needs a colonoscopy since she has a history of colon polyps.  The patient has a cologuard sent off already and she has been told that if this is positive then she should attempt to undergo another colonoscopy.  If it is negative, in a patient with a history of colon polyps, then consideration of a repeat colonoscopy should be undertaken since the cologuard is not meant to be used as a screening test for people with a history of colon polyps.  The patient will contact us when she receives the results of her cologuard.  Lucilla Lame, MD. Marval Regal    Note: This dictation was prepared with Dragon dictation along with smaller phrase technology. Any transcriptional errors that result from this process are unintentional.

## 2018-05-27 LAB — CBC WITH DIFFERENTIAL/PLATELET
Absolute Monocytes: 814 cells/uL (ref 200–950)
Basophils Absolute: 77 cells/uL (ref 0–200)
Basophils Relative: 0.7 %
Eosinophils Absolute: 231 cells/uL (ref 15–500)
Eosinophils Relative: 2.1 %
HCT: 44.6 % (ref 35.0–45.0)
Hemoglobin: 15.2 g/dL (ref 11.7–15.5)
Lymphs Abs: 3564 cells/uL (ref 850–3900)
MCH: 28.3 pg (ref 27.0–33.0)
MCHC: 34.1 g/dL (ref 32.0–36.0)
MCV: 82.9 fL (ref 80.0–100.0)
MPV: 10.4 fL (ref 7.5–12.5)
Monocytes Relative: 7.4 %
Neutro Abs: 6314 cells/uL (ref 1500–7800)
Neutrophils Relative %: 57.4 %
Platelets: 307 10*3/uL (ref 140–400)
RBC: 5.38 10*6/uL — ABNORMAL HIGH (ref 3.80–5.10)
RDW: 13.3 % (ref 11.0–15.0)
Total Lymphocyte: 32.4 %
WBC: 11 10*3/uL — ABNORMAL HIGH (ref 3.8–10.8)

## 2018-05-27 LAB — COMPLETE METABOLIC PANEL WITH GFR
AG Ratio: 1.4 (calc) (ref 1.0–2.5)
ALT: 12 U/L (ref 6–29)
AST: 15 U/L (ref 10–35)
Albumin: 4 g/dL (ref 3.6–5.1)
Alkaline phosphatase (APISO): 82 U/L (ref 33–130)
BUN/Creatinine Ratio: 17 (calc) (ref 6–22)
BUN: 18 mg/dL (ref 7–25)
CO2: 27 mmol/L (ref 20–32)
Calcium: 9.5 mg/dL (ref 8.6–10.4)
Chloride: 101 mmol/L (ref 98–110)
Creat: 1.03 mg/dL — ABNORMAL HIGH (ref 0.60–0.93)
GFR, Est African American: 63 mL/min/{1.73_m2} (ref >=60)
GFR, Est Non African American: 55 mL/min/{1.73_m2} — ABNORMAL LOW (ref >=60)
Globulin: 2.8 g/dL (calc) (ref 1.9–3.7)
Glucose, Bld: 144 mg/dL — ABNORMAL HIGH (ref 65–99)
Potassium: 5.2 mmol/L (ref 3.5–5.3)
Sodium: 137 mmol/L (ref 135–146)
Total Bilirubin: 0.7 mg/dL (ref 0.2–1.2)
Total Protein: 6.8 g/dL (ref 6.1–8.1)

## 2018-05-27 LAB — LIPID PANEL
Cholesterol: 197 mg/dL (ref <200)
HDL: 48 mg/dL — ABNORMAL LOW (ref >50)
LDL Cholesterol (Calc): 116 mg/dL (calc) — ABNORMAL HIGH
Non-HDL Cholesterol (Calc): 149 mg/dL (calc) — ABNORMAL HIGH (ref <130)
Total CHOL/HDL Ratio: 4.1 (calc) (ref <5.0)
Triglycerides: 209 mg/dL — ABNORMAL HIGH (ref <150)

## 2018-05-27 LAB — B. BURGDORFI ANTIBODIES: B burgdorferi Ab IgG+IgM: <0.9 index

## 2018-05-27 LAB — TSH: TSH: 1.17 mIU/L (ref 0.40–4.50)

## 2018-05-27 LAB — T4, FREE: Free T4: 1 ng/dL (ref 0.8–1.8)

## 2018-05-28 ENCOUNTER — Telehealth: Payer: Self-pay | Admitting: Nurse Practitioner

## 2018-05-28 ENCOUNTER — Telehealth: Payer: Self-pay | Admitting: Gastroenterology

## 2018-05-28 DIAGNOSIS — H40011 Open angle with borderline findings, low risk, right eye: Secondary | ICD-10-CM | POA: Diagnosis not present

## 2018-05-28 DIAGNOSIS — H2513 Age-related nuclear cataract, bilateral: Secondary | ICD-10-CM | POA: Diagnosis not present

## 2018-05-28 DIAGNOSIS — E119 Type 2 diabetes mellitus without complications: Secondary | ICD-10-CM | POA: Diagnosis not present

## 2018-05-28 DIAGNOSIS — H40022 Open angle with borderline findings, high risk, left eye: Secondary | ICD-10-CM | POA: Diagnosis not present

## 2018-05-28 LAB — HM DIABETES EYE EXAM

## 2018-05-28 NOTE — Telephone Encounter (Signed)
Pt wants Dr. Allen Norris to know her Colorguard test was positive. Pt is getting her (bruie)examined & will call after she rec the results.

## 2018-05-28 NOTE — Telephone Encounter (Signed)
Call placed to patient to discuss positive Cologuard testing.    Decision was made mutually with patient at her previous visit to proceed with cologuard. Prior GI specialist in [place] indicated to patient that she would have very high risk colonoscopy in future and was not patient's preference for repeat screening.  If negative, would forego colonoscopy.  If positive, would consider options with GI here.  Patient discussed possible colonoscopy with Dr. Allen Norris at her most recent visit with him. Patient would like to schedule colonoscopy with Dr. Allen Norris, but wishes to wait until after bilateral carotid US is completed.  It is not yet scheduled, so I am requesting update about the ordered test today.

## 2018-05-29 ENCOUNTER — Encounter: Payer: Self-pay | Admitting: Nurse Practitioner

## 2018-05-30 ENCOUNTER — Ambulatory Visit: Payer: Medicare Other

## 2018-05-31 ENCOUNTER — Other Ambulatory Visit: Payer: Self-pay | Admitting: Nurse Practitioner

## 2018-05-31 DIAGNOSIS — R1011 Right upper quadrant pain: Secondary | ICD-10-CM

## 2018-06-05 ENCOUNTER — Ambulatory Visit
Admission: RE | Admit: 2018-06-05 | Discharge: 2018-06-05 | Disposition: A | Discharge status: Home / Self Care | Payer: Medicare Other | Source: Ambulatory Visit | Attending: Nurse Practitioner | Admitting: Nurse Practitioner

## 2018-06-05 DIAGNOSIS — I6523 Occlusion and stenosis of bilateral carotid arteries: Secondary | ICD-10-CM | POA: Diagnosis not present

## 2018-06-05 DIAGNOSIS — R0989 Other specified symptoms and signs involving the circulatory and respiratory systems: Secondary | ICD-10-CM | POA: Insufficient documentation

## 2018-06-05 DIAGNOSIS — E782 Mixed hyperlipidemia: Secondary | ICD-10-CM

## 2018-06-05 NOTE — Addendum Note (Signed)
Addended by: Cleaster Corin on: 06/05/2018 05:33 PM   Modules accepted: Orders

## 2018-06-06 ENCOUNTER — Telehealth: Payer: Self-pay | Admitting: Gastroenterology

## 2018-06-06 ENCOUNTER — Other Ambulatory Visit: Payer: Self-pay | Admitting: Nurse Practitioner

## 2018-06-06 DIAGNOSIS — I10 Essential (primary) hypertension: Secondary | ICD-10-CM

## 2018-06-06 NOTE — Telephone Encounter (Signed)
Patient called today to let Dr Allen Norris know she had bilateral carotid u/s that showed 50% blockages. She had a positive cologaurd and needs to have a colonoscopy. Does he fill ok  putting her under anesthesia?

## 2018-06-09 ENCOUNTER — Encounter: Payer: Self-pay | Admitting: Urology

## 2018-06-09 ENCOUNTER — Ambulatory Visit (INDEPENDENT_AMBULATORY_CARE_PROVIDER_SITE_OTHER): Payer: Medicare Other | Admitting: Urology

## 2018-06-09 VITALS — BP 174/76 | HR 73 | Ht 69.0 in | Wt 243.0 lb

## 2018-06-09 DIAGNOSIS — I6523 Occlusion and stenosis of bilateral carotid arteries: Secondary | ICD-10-CM

## 2018-06-09 DIAGNOSIS — R310 Gross hematuria: Secondary | ICD-10-CM | POA: Diagnosis not present

## 2018-06-09 DIAGNOSIS — R32 Unspecified urinary incontinence: Secondary | ICD-10-CM

## 2018-06-09 LAB — URINALYSIS, COMPLETE
Bilirubin, UA: NEGATIVE
Glucose, UA: NEGATIVE
Ketones, UA: NEGATIVE
Leukocytes, UA: NEGATIVE
NITRITE UA: NEGATIVE
Protein, UA: NEGATIVE
RBC, UA: NEGATIVE
Specific Gravity, UA: >1.03 — ABNORMAL HIGH (ref 1.005–1.030)
Urobilinogen, Ur: 0.2 mg/dL (ref 0.2–1.0)
pH, UA: 5.5 (ref 5.0–7.5)

## 2018-06-09 LAB — BLADDER SCAN AMB NON-IMAGING: Scan Result: 0

## 2018-06-09 LAB — MICROSCOPIC EXAMINATION
RBC, UA: NONE SEEN /hpf (ref 0–2)
WBC, UA: NONE SEEN /hpf (ref 0–5)

## 2018-06-09 MED ORDER — NITROFURANTOIN MACROCRYSTAL 100 MG PO CAPS: 100.0000 mg | ORAL_CAPSULE | Freq: Every day | ORAL | 11 refills | Status: DC

## 2018-06-09 NOTE — Progress Notes (Signed)
06/09/2018 2:40 PM   [name] 1945/09/30 [id number]  Referring provider: Mikey College, NP [place], [place] [address]  Chief Complaint  Patient presents with  . Recurrent UTI    HPI: The patient describes for 5 bladder infections sometimes associated with gross hematuria or feeling poorly in the last year.  She had similar when she was young.  She describes a normal cystoscopy by a local urologist but has never had an x-ray.  She does not think she is infected today.  Levaquin works the best and she needed a second antibiotic recently.  She has a lot of allergies  At baseline she leaks with coughing sneezing and rarely with urgency she holds it too long.  She wears 1-5 light pads a day but is quite fastidious.  She voids every 3 hours and sometimes gets up once a night  She is an insulin-dependent diabetic.  She has had a hysterectomy  She denies history of kidney stones or previous GU surgery  Modifying factors: There are no other modifying factors  Associated signs and symptoms: There are no other associated signs and symptoms Aggravating and relieving factors: There are no other aggravating or relieving factors Severity: Moderate Duration: Persistent   PMH: Past Medical History:  Diagnosis Date  . Anterolisthesis    Cervical spine  . Asthma   . Connective tissue disorder (HCC)    recurrent carotid arteritis, temporal arteritis, vasculitis mandible, general hepatitis, avascular necrosis bilat  . DDD (degenerative disc disease), lumbar   . Diabetes mellitus without complication ([place])   . Diverticulosis   . Diverticulosis   . Duodenitis   . Gouty arthritis   . Hiatal hernia with GERD   . Hyperlipidemia   . Hypertension   . Hyperuricemia   . Keratitis sicca, bilateral   . Lymphedema of both lower extremities   . Osteoarthritis   . Pericarditis    Recurrent: Aug 97, July 05, Sept 08, July 16, July 18  . PUD (peptic ulcer disease)   . Sicca  syndrome ([place])   . Sjogren's syndrome ([place])   . Sjogren's syndrome ([place])   . Syncope   . Tendonitis, Achilles, right   . Tortuous colon   . Trigger finger     Surgical History: Past Surgical History:  Procedure Laterality Date  . ABDOMINAL HYSTERECTOMY    . BREAST BIOPSY    . CHOLECYSTECTOMY    . CYSTOSCOPY  02/26/2018   [name], MD   . distal arthrectomy    . EXCISION NEUROMA    . KNEE SURGERY Bilateral    1985, 2001, 11/2002, 12/2006  . panhysterectomy  10/1983  . SHOULDER SURGERY Right    reverse total arthroplasty w/ biceps tenodesis  . TONSILLECTOMY AND ADENOIDECTOMY    . TOTAL HIP ARTHROPLASTY Right 04/2007  . WISDOM TOOTH EXTRACTION      Home Medications:  Allergies as of 06/09/2018      Reactions   Amlodipine    Other reaction(s): Unknown   Aspirin    Other reaction(s): Unknown   Bee Venom    Betadine [povidone Iodine]    Ciprofloxacin    Other reaction(s): Other (see comments), Unknown   Codeine    Other reaction(s): Unknown Must have pre medications before taking per patient report Includes all derivatives   Erythromycin    Other reaction(s): Unknown, Unknown   Glimepiride    Other reaction(s): Unknown   Hydralazine    Other reaction(s): Unknown  Hydrochlorothiazide    Other reaction(s): Dizziness or lightheadedness INCREASED CRANIAL PRESSURE   Hydrocodone    Other reaction(s): Unknown Must have pre medications before taking per patient report   Hydrocodone-acetaminophen Nausea And Vomiting   MUST BE PREMEDICATED   Hydromorphone Nausea And Vomiting   Other reaction(s): Unknown Must have pre medications before taking per patient report MUST BE PREMEDICATED   Iohexol    Irbesartan    Other reaction(s): Unknown   Irbesartan-hydrochlorothiazide    Other reaction(s): Unknown   Lisinopril    Other reaction(s): Unknown   Maxitrol [neomycin-polymyxin-dexameth]    Metformin And Related    Metformin Hcl    Other reaction(s): Unknown    Methylcellulose    Other reaction(s): Unknown   Metoclopramide Nausea And Vomiting   Other reaction(s): Unknown   Metoprolol    Other reaction(s): Unknown   Neomycin-bacitracin Zn-polymyx    Other reaction(s): Unknown   Norvasc [amlodipine Besylate]    Nsaids Other (See Comments)   Other reaction(s): Unknown bleeding   Omeprazole Magnesium    Other reaction(s): Unknown   Other    Pt is allergic to staples and surgical metals   Oxycodone-acetaminophen    Other reaction(s): Unknown, Unknown Must have pre medications before taking per patient report   Penicillins    Other reaction(s): Unknown, Unknown   Pioglitazone    Other reaction(s): Unknown   Poison [place]ac Extract    Poison [place] and [place] also   Povidone-iodine    Other reaction(s): Unknown   Prilosec [omeprazole]    Silver Sulfadiazine    Other reaction(s): Unknown   Sulfa Antibiotics    Other reaction(s): Unknown, Unknown   Tape    Other reaction(s): Unknown   Tetanus Toxoid Other (See Comments)   Tramadol    Other reaction(s): Unknown Must have pre medications before taking per patient report      Medication List       Accurate as of June 09, 2018  2:40 PM. Always use your most recent med list.        BYSTOLIC 20 MG Tabs Generic drug:  Nebivolol HCl TAKE 1 TABLET BY MOUTH ONCE DAILY   colchicine 0.6 MG tablet 0.6 mg every 4 (four) hours as needed   cyclobenzaprine 5 MG tablet Commonly known as:  FLEXERIL TAKE 1 TABLET (5 MG TOTAL) BY MOUTH ONCE DAILY AS NEEDED FOR MUSCLE SPASMS   freestyle lancets Must test x4/day   FREESTYLE LITE test strip Generic drug:  glucose blood Use as directed three times daily for diabetes.   FREESTYLE LITE test strip Generic drug:  glucose blood Use as directed three times daily for diabetes.   PRECISION QID TEST test strip Generic drug:  glucose blood Use as directed twice daily for diabetes monitoring.   insulin degludec 100 UNIT/ML Sopn FlexTouch  Pen Commonly known as:  TRESIBA FLEXTOUCH Inject 0.56 mLs (56 Units total) into the skin daily.   insulin lispro 100 UNIT/ML injection Commonly known as:  HUMALOG Administer 10 units at meals and 1 unit per 20 over 140 (up to 50 units per day total).   INSULIN SYRINGE .5CC/31GX5/16" 31G X 5/16" 0.5 ML Misc Inject x3/day   lactase 3000 units tablet Commonly known as:  LACTAID Take by mouth.   Pen Needles 32G X 5 MM Misc 1 Device by Does not apply route daily.   prednisoLONE acetate 1 % ophthalmic suspension Commonly known as:  PRED FORTE INSTILL ONE DROP  as needed   ranitidine 300  MG tablet Commonly known as:  ZANTAC Take by mouth.   rosuvastatin 10 MG tablet Commonly known as:  CRESTOR Take by mouth.   Vitamin D3 25 MCG (1000 UT) Caps Take by mouth.       Allergies:  Allergies  Allergen Reactions  . Amlodipine     Other reaction(s): Unknown  . Aspirin     Other reaction(s): Unknown  . Bee Venom   . Betadine [Povidone Iodine]   . Ciprofloxacin     Other reaction(s): Other (see comments), Unknown  . Codeine     Other reaction(s): Unknown Must have pre medications before taking per patient report Includes all derivatives   . Erythromycin     Other reaction(s): Unknown, Unknown  . Glimepiride     Other reaction(s): Unknown  . Hydralazine     Other reaction(s): Unknown  . Hydrochlorothiazide     Other reaction(s): Dizziness or lightheadedness INCREASED CRANIAL PRESSURE  . Hydrocodone     Other reaction(s): Unknown Must have pre medications before taking per patient report  . Hydrocodone-Acetaminophen Nausea And Vomiting    MUST BE PREMEDICATED  . Hydromorphone Nausea And Vomiting    Other reaction(s): Unknown Must have pre medications before taking per patient report MUST BE PREMEDICATED   . Iohexol   . Irbesartan     Other reaction(s): Unknown  . Irbesartan-Hydrochlorothiazide     Other reaction(s): Unknown  . Lisinopril     Other reaction(s):  Unknown  . Maxitrol [Neomycin-Polymyxin-Dexameth]   . Metformin And Related   . Metformin Hcl     Other reaction(s): Unknown  . Methylcellulose     Other reaction(s): Unknown  . Metoclopramide Nausea And Vomiting    Other reaction(s): Unknown  . Metoprolol     Other reaction(s): Unknown  . Neomycin-Bacitracin Zn-Polymyx     Other reaction(s): Unknown  . Norvasc [Amlodipine Besylate]   . Nsaids Other (See Comments)    Other reaction(s): Unknown bleeding   . Omeprazole Magnesium     Other reaction(s): Unknown  . Other     Pt is allergic to staples and surgical metals  . Oxycodone-Acetaminophen     Other reaction(s): Unknown, Unknown Must have pre medications before taking per patient report   . Penicillins     Other reaction(s): Unknown, Unknown  . Pioglitazone     Other reaction(s): Unknown  . Poison [employer]     Poison [place] and [place] also  . Povidone-Iodine     Other reaction(s): Unknown  . Prilosec [Omeprazole]   . Silver Sulfadiazine     Other reaction(s): Unknown  . Sulfa Antibiotics     Other reaction(s): Unknown, Unknown  . Tape     Other reaction(s): Unknown  . Tetanus Toxoid Other (See Comments)  . Tramadol     Other reaction(s): Unknown Must have pre medications before taking per patient report    Family History: History reviewed. No pertinent family history.  Social History:  reports that she quit smoking about 22 years ago. She has never used smokeless tobacco. She reports current alcohol use. No history on file for drug.  ROS: UROLOGY Frequent Urination?: No Hard to postpone urination?: No Burning/pain with urination?: No Get up at night to urinate?: No Leakage of urine?: No Urine stream starts and stops?: No Trouble starting stream?: No Do you have to strain to urinate?: No Blood in urine?: Yes Urinary tract infection?: Yes Sexually transmitted disease?: No Injury to kidneys or bladder?: No Painful intercourse?: No Weak  stream?:  No Currently pregnant?: No Vaginal bleeding?: No Last menstrual period?: n  Gastrointestinal Nausea?: Yes Vomiting?: No Indigestion/heartburn?: Yes Diarrhea?: No Constipation?: No  Constitutional Fever: No Night sweats?: No Weight loss?: No Fatigue?: No  Skin Skin rash/lesions?: No Itching?: No  Eyes Blurred vision?: No Double vision?: No  Ears/Nose/Throat Sore throat?: No Sinus problems?: No  Hematologic/Lymphatic Swollen glands?: No Easy bruising?: No  Cardiovascular Leg swelling?: Yes Chest pain?: No  Respiratory Cough?: No Shortness of breath?: No  Endocrine Excessive thirst?: No  Musculoskeletal Back pain?: Yes Joint pain?: Yes  Neurological Headaches?: No Dizziness?: No  Psychologic Depression?: No Anxiety?: No  Physical Exam: BP (!) 174/76 (BP Location: Left Arm, Patient Position: Sitting, Cuff Size: Normal)   Pulse 73   Ht 5\' 9"  (1.753 m)   Wt 243 lb (110.2 kg)   BMI 35.88 kg/m   Constitutional:  Alert and oriented, No acute distress. HEENT: [place] AT, moist mucus membranes.  Trachea midline, no masses. Cardiovascular: No clubbing, cyanosis, or edema. Respiratory: Normal respiratory effort, no increased work of breathing. GI: Abdomen is soft, nontender, nondistended, no abdominal masses GU: Mild vaginal narrowing with no prolapse or stress incontinence. Skin: No rashes, bruises or suspicious lesions. Lymph: No cervical or inguinal adenopathy. Neurologic: Grossly intact, no focal deficits, moving all 4 extremities. Psychiatric: Normal mood and affect.  Laboratory Data: Lab Results  Component Value Date   WBC 11.0 (H) 05/26/2018   HGB 15.2 05/26/2018   HCT 44.6 05/26/2018   MCV 82.9 05/26/2018   PLT 307 05/26/2018    Lab Results  Component Value Date   CREATININE 1.03 (H) 05/26/2018    No results found for: PSA  No results found for: TESTOSTERONE  Lab Results  Component Value Date   HGBA1C 7.6 (A) 05/22/2018     Urinalysis No results found for: COLORURINE, APPEARANCEUR, LABSPEC, [place], GLUCOSEU, HGBUR, BILIRUBINUR, KETONESUR, PROTEINUR, UROBILINOGEN, NITRITE, LEUKOCYTESUR  Pertinent Imaging:   Assessment & Plan: The role of urinary prophylaxis and CT scan with hematuria work-up described.  The patient extensively was worked up for a possible malignancy a year ago in [place] with multiple gastroenterology checkups and a CT scan according to her.  She smoked 22 years ago.  She had a limited ultrasound of her gallbladder normal November 2019  After lengthy discussions and differential diagnosis and for many minutes myself listening to her other health issues and work-ups in [place] and locally we decided on the following.  She will start on daily Macrodantin and prescription sent.  I sent today's urine for culture.  I will get a hematuria CT scan and call her if the report is abnormal.  I will recheck on her in 7 weeks.  She is in the interim she likely will proceed with another colonoscopy.  She had a test that says she may have bowel cancer but apparently is a difficult colonoscopy.  She does not tolerate medication well and gets pressure and perhaps a rash with sulfa  1. Urinary incontinence, unspecified type   - Urinalysis, Complete - BLADDER SCAN AMB NON-IMAGING   No follow-ups on file.  Reece Packer, MD  [place] [address], [place], [place] [address] [phone]

## 2018-06-12 ENCOUNTER — Other Ambulatory Visit: Payer: Self-pay

## 2018-06-12 LAB — CULTURE, URINE COMPREHENSIVE

## 2018-06-12 NOTE — Telephone Encounter (Signed)
The patient know that we appreciate her finding out this information for [place] but as far as getting the colonoscopy this would need to be cleared by her primary care physician or cardiologist with her carotid stenosis.

## 2018-06-12 NOTE — Telephone Encounter (Signed)
Pt notified that per Dr. Allen Norris, he will require a cardiac clearance before scheduling her for the colonoscopy. Request has been faxed to Dr. Donato Schultz at [facility] at [phone]. Advised pt when we receive signed clearance, I will contact her to schedule.

## 2018-06-13 ENCOUNTER — Encounter: Payer: Self-pay | Admitting: Nurse Practitioner

## 2018-06-13 ENCOUNTER — Telehealth: Payer: Self-pay | Admitting: Family Medicine

## 2018-06-13 NOTE — Telephone Encounter (Signed)
-----   Message from Bjorn Loser, MD sent at 06/13/2018  7:02 AM EST -----  Take the Macrodantin 100 mg twice a day for 1 week; then go back on it once daily Last clinic I had placed her on daily suppression therapy but she needs to double it for 1 week thank you             ----- Message ----- From: Kyra Manges, CMA Sent: 06/12/2018  12:00 PM EST To: Bjorn Loser, MD   ----- Message ----- From: Interface, [facility] Lab Results In Sent: 06/09/2018   4:36 PM EST To: [facility]

## 2018-06-13 NOTE — Telephone Encounter (Signed)
Patient notified and voiced understanding. She will double up for 1 week then back 1 time daily.

## 2018-06-17 ENCOUNTER — Telehealth: Payer: Self-pay | Admitting: Urology

## 2018-06-17 ENCOUNTER — Ambulatory Visit: Payer: Medicare Other

## 2018-06-17 VITALS — BP 154/81 | HR 62

## 2018-06-17 DIAGNOSIS — I10 Essential (primary) hypertension: Secondary | ICD-10-CM

## 2018-06-17 NOTE — Telephone Encounter (Signed)
Per Colletta Maryland in CT this patient needs to be premedicated prior to her CT so she will need a script called in to her pharmacy asap. She will contact the patient today and let her know that we will be calling this into her pharmacy today. Can someone please take care of this.   Thanks, Sharyn Lull

## 2018-06-18 NOTE — Progress Notes (Signed)
Will treat BP readings from clinic for hypertension titration in future.

## 2018-06-18 NOTE — Progress Notes (Signed)
The pt came in the office for a blood pressure monitors calibration. Her wrist blood pressure left arm 154/81, pulse 62. I repeated the blood on her left arm  167/70, pulse 62.

## 2018-06-18 NOTE — Telephone Encounter (Signed)
Can you please have something sent in for this patient

## 2018-06-18 NOTE — Telephone Encounter (Signed)
It's okay to give her 5 mg of Valium prior to the test.  I'm not able to send in from here since this system is not working

## 2018-06-19 ENCOUNTER — Other Ambulatory Visit: Payer: Self-pay | Admitting: Urology

## 2018-06-19 ENCOUNTER — Other Ambulatory Visit: Payer: Self-pay | Admitting: Family Medicine

## 2018-06-19 ENCOUNTER — Ambulatory Visit: Admission: RE | Admit: 2018-06-19 | Payer: PRIVATE HEALTH INSURANCE | Source: Ambulatory Visit

## 2018-06-19 ENCOUNTER — Telehealth: Payer: Self-pay | Admitting: Urology

## 2018-06-19 ENCOUNTER — Ambulatory Visit: Admission: RE | Admit: 2018-06-19 | Payer: Medicare Other | Source: Ambulatory Visit

## 2018-06-19 DIAGNOSIS — R310 Gross hematuria: Secondary | ICD-10-CM

## 2018-06-19 DIAGNOSIS — R32 Unspecified urinary incontinence: Secondary | ICD-10-CM

## 2018-06-19 NOTE — Telephone Encounter (Signed)
Spoke to patient and corrected her Allergy list per patient. She is not allergic to contrast dye. She has an appointment schedule for CT scan

## 2018-06-19 NOTE — Telephone Encounter (Signed)
[place] gave [name], Pharmacist a verbal rx for 1 valium 30 mins prior to procedure. Advised pt she will need driver.

## 2018-06-19 NOTE — Telephone Encounter (Signed)
Pt lmom stating she was returning a missed call from [facility], Please call pt at [phone]. thanks

## 2018-06-20 ENCOUNTER — Ambulatory Visit
Admission: RE | Admit: 2018-06-20 | Discharge: 2018-06-20 | Disposition: A | Discharge status: Home / Self Care | Payer: Medicare Other | Source: Ambulatory Visit | Attending: Urology | Admitting: Urology

## 2018-06-20 DIAGNOSIS — R32 Unspecified urinary incontinence: Secondary | ICD-10-CM | POA: Diagnosis not present

## 2018-06-20 DIAGNOSIS — R310 Gross hematuria: Secondary | ICD-10-CM | POA: Diagnosis not present

## 2018-06-20 DIAGNOSIS — K449 Diaphragmatic hernia without obstruction or gangrene: Secondary | ICD-10-CM | POA: Diagnosis not present

## 2018-06-20 MED ORDER — IOPAMIDOL (ISOVUE-300) INJECTION 61%
125.0000 mL | Freq: Once | INTRAVENOUS | Status: AC | PRN
  Administered 2018-06-20: 125 mL via INTRAVENOUS

## 2018-06-30 ENCOUNTER — Telehealth: Payer: Self-pay | Admitting: Urology

## 2018-06-30 NOTE — Telephone Encounter (Signed)
Pt had CT on 1/10 and called office asking about results.

## 2018-06-30 NOTE — Telephone Encounter (Signed)
Patient notified per Dr. Matilde Sprang that scan was normal

## 2018-07-02 ENCOUNTER — Ambulatory Visit: Payer: PRIVATE HEALTH INSURANCE

## 2018-07-03 ENCOUNTER — Other Ambulatory Visit: Payer: Self-pay

## 2018-07-03 ENCOUNTER — Ambulatory Visit (INDEPENDENT_AMBULATORY_CARE_PROVIDER_SITE_OTHER): Payer: Medicare Other | Admitting: Nurse Practitioner

## 2018-07-03 ENCOUNTER — Encounter: Payer: Self-pay | Admitting: Nurse Practitioner

## 2018-07-03 VITALS — BP 159/69 | HR 65 | Temp 98.5°F | Ht 69.0 in | Wt 244.4 lb

## 2018-07-03 DIAGNOSIS — I10 Essential (primary) hypertension: Secondary | ICD-10-CM | POA: Diagnosis not present

## 2018-07-03 DIAGNOSIS — E11649 Type 2 diabetes mellitus with hypoglycemia without coma: Secondary | ICD-10-CM | POA: Diagnosis not present

## 2018-07-03 MED ORDER — NEBIVOLOL HCL 10 MG PO TABS: 25.0000 mg | ORAL_TABLET | Freq: Every day | ORAL | 1 refills | Status: DC

## 2018-07-03 NOTE — Progress Notes (Signed)
Subjective:    Patient ID: [name], female    DOB: [birth year], 73 y.o.   MRN: [id number]  [name] is a 73 y.o. female presenting on 07/03/2018 for Diabetes   HPI Diabetes Reports a couple lows.  Changed back to [facility]' insulin regimen with 10 units with meals and 1 unit per 10 over 140.  With 1 per 20 has not had good readings. Reading was 109 after first meal around 10 am.  In am before eating was 104.  Has been around 100-110.  No extra insulin after last meal.   Pt presents today for follow up of Type 2 diabetes mellitus. She is checking fasting am CBG at home with a range of   - Current diabetic medications include: Tresiba 56 units once daily, Humalog - She is not currently symptomatic, but has continued to have hypoglycemia events.  Patient states she is treating to keep blood sugar always < 120 and has knowledge deficit about post meal glucose readings.  - She denies polydipsia, polyphagia, polyuria, headaches, diaphoresis, pain, numbness or tingling in extremities and changes in vision.   - Clinical course has been unchanged. - She  reports no regular exercise routine. - Her diet is moderate in salt, moderate in fat, and high in carbohydrates. - Weight trend: stable  Recent Labs    05/22/18 1408  HGBA1C 7.6*    Hypertension - She is not checking BP at home or outside of clinic.    - Current medications tolerating well without side effects. - She is not currently symptomatic. - Pt denies headache, lightheadedness, dizziness, changes in vision, chest tightness/pressure, palpitations, leg swelling, sudden loss of speech or loss of consciousness.  Social History   Tobacco Use  . Smoking status: Former Smoker    Last attempt to quit: 01/22/1996    Years since quitting: 22.4  . Smokeless tobacco: Never Used  Substance Use Topics  . Alcohol use: Yes    Comment: occasional  . Drug use: Not on file    Review of Systems Per HPI unless specifically indicated  above     Objective:    BP (!) 159/69 (BP Location: Right Arm, Patient Position: Sitting, Cuff Size: Normal)   Pulse 65   Temp 98.5 F (36.9 C) (Oral)   Ht 5\' 9"  (1.753 m)   Wt 244 lb 6.4 oz (110.9 kg)   BMI 36.09 kg/m   Wt Readings from Last 3 Encounters:  07/03/18 244 lb 6.4 oz (110.9 kg)  06/09/18 243 lb (110.2 kg)  05/26/18 238 lb (108 kg)    Physical Exam Vitals signs reviewed.  Constitutional:      General: She is awake. She is not in acute distress.    Appearance: She is well-developed.  HENT:     Head: Normocephalic and atraumatic.  Neck:     Musculoskeletal: Normal range of motion and neck supple.     Vascular: No carotid bruit.  Cardiovascular:     Rate and Rhythm: Normal rate and regular rhythm.     Pulses:          Radial pulses are 2+ on the right side and 2+ on the left side.       Posterior tibial pulses are 1+ on the right side and 1+ on the left side.     Heart sounds: Normal heart sounds, S1 normal and S2 normal.  Pulmonary:     Effort: Pulmonary effort is normal. No respiratory distress.  Breath sounds: Normal breath sounds and air entry.  Skin:    General: Skin is warm and dry.  Neurological:     Mental Status: She is alert and oriented to person, place, and time.  Psychiatric:        Attention and Perception: Attention normal.        Mood and Affect: Mood and affect normal.        Behavior: Behavior normal. Behavior is cooperative.    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  BP goal < 130/80.  Pt is working on lifestyle modifications.  Taking medications tolerating well without side effects. No current complications.  Plan: 1. Continue taking nebivolol without  changes.  Consider future dose increase if needed. 2. Obtain labs 3 mos 3. Encouraged heart healthy diet and increasing exercise to 30 minutes most days of the week. 4. Check BP 1-2 x per week at home, keep log, and bring to clinic at next appointment. 5. Follow up 3 months.     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  Patient self-managing insulin doses to doses higher than needed. Goal A1c < 7.0%. - Complications - hyperglycemia and hypoglycemia.  Plan:  1. Change therapy:  - Continue Humalog 10 units at meals and 1 unit per 20 mg glucose above 150. - Continue Tresiba 56 units once daily 2. Encourage improved lifestyle: - low carb/low glycemic diet reinforced prior education - Increase physical activity to 30 minutes most days of the week.  Explained that increased physical activity increases body's use of sugar for energy. 3. Check fasting am CBG and bring log to next visit for review 4. Continue ASA and Statin 5. Instructed pt to only change insulin doses as directed.  Suspect elevated A1c in past due to 70/30 insulin, reactive hyperglycemia or hyperglycemia due to overtreatment of frequent hypoglycemic events.   - Patient will likely require endocrinology referral in future if not improving at next visit. 6. Follow-up 6-7 weeks for repeat A1c.      Meds ordered this encounter  Medications  . nebivolol (BYSTOLIC) 10 MG tablet    Sig: Take 2.5 tablets (25 mg total) by mouth daily.    Dispense:  75 tablet    Refill:  1    Order Specific Question:   Supervising Provider    Answer:   Olin Hauser [[id number]]    Follow up plan: Return in about 7 weeks (around 08/22/2018) for diabetes.  Cassell Smiles, DNP, AGPCNP-BC Adult Gerontology Primary Care Nurse Practitioner [facility] 07/03/2018, 1:56 PM

## 2018-07-03 NOTE — Patient Instructions (Addendum)
[name],   Thank you for coming in to clinic today.  1. Continue with your Humalog at 10 units per meal and 1 unit per 20 mg/dL of glucose above. - Check sugars after meals (do not treat 2 hour post meal sugar).  2. Continue Tresiba 56 units once daily.  Please schedule a follow-up appointment with Cassell Smiles, AGNP. Return in about 7 weeks (around 08/22/2018) for diabetes.  If you have any other questions or concerns, please feel free to call the clinic or send a message through [place]. You may also schedule an earlier appointment if necessary.  You will receive a survey after today's visit either digitally by e-mail or paper by [employer]. Your experiences and feedback matter to [place].  Please respond so we know how we are doing as we provide care for you.   Cassell Smiles, DNP, AGNP-BC Adult Gerontology Nurse Practitioner [place]

## 2018-07-07 ENCOUNTER — Encounter: Payer: PRIVATE HEALTH INSURANCE | Admitting: Surgery

## 2018-07-10 ENCOUNTER — Encounter: Payer: Self-pay | Admitting: Nurse Practitioner

## 2018-07-14 DIAGNOSIS — Z01810 Encounter for preprocedural cardiovascular examination: Secondary | ICD-10-CM | POA: Diagnosis not present

## 2018-07-14 DIAGNOSIS — I313 Pericardial effusion (noninflammatory): Secondary | ICD-10-CM | POA: Diagnosis not present

## 2018-07-16 DIAGNOSIS — E113293 Type 2 diabetes mellitus with mild nonproliferative diabetic retinopathy without macular edema, bilateral: Secondary | ICD-10-CM | POA: Diagnosis not present

## 2018-07-16 DIAGNOSIS — Z1231 Encounter for screening mammogram for malignant neoplasm of breast: Secondary | ICD-10-CM | POA: Diagnosis not present

## 2018-07-16 DIAGNOSIS — Z794 Long term (current) use of insulin: Secondary | ICD-10-CM | POA: Diagnosis not present

## 2018-07-21 ENCOUNTER — Ambulatory Visit (INDEPENDENT_AMBULATORY_CARE_PROVIDER_SITE_OTHER): Payer: Medicare Other | Admitting: Urology

## 2018-07-21 ENCOUNTER — Telehealth: Payer: Self-pay | Admitting: Urology

## 2018-07-21 ENCOUNTER — Encounter: Payer: Self-pay | Admitting: Urology

## 2018-07-21 VITALS — BP 146/84 | HR 75 | Ht 69.0 in | Wt 240.0 lb

## 2018-07-21 DIAGNOSIS — R32 Unspecified urinary incontinence: Secondary | ICD-10-CM | POA: Diagnosis not present

## 2018-07-21 DIAGNOSIS — N390 Urinary tract infection, site not specified: Secondary | ICD-10-CM

## 2018-07-21 NOTE — Progress Notes (Signed)
07/21/2018 2:31 PM   [name] [birth year] [id number]  Referring provider: Mikey College, NP [place], [place] [address]  Chief Complaint  Patient presents with  . Urinary Incontinence    6 week fu    HPI: The patient describes for 5 bladder infections sometimes associated with gross hematuria or feeling poorly in the last year.  She had similar when she was young.  She describes a normal cystoscopy by a local urologist but has never had an x-ray.  She does not think she is infected today.  Levaquin works the best and she needed a second antibiotic recently.  She has a lot of allergies  At baseline she leaks with coughing sneezing and rarely with urgency she holds it too long.  She wears 1-5 light pads a day but is quite fastidious.  She voids every 3 hours and sometimes gets up once a night  The role of urinary prophylaxis and CT scan with hematuria work-up described.  The patient extensively was worked up for a possible malignancy a year ago in [place] with multiple gastroenterology checkups and a CT scan according to her.  She smoked 22 years ago.  She had a limited ultrasound of her gallbladder normal November 2019  After lengthy discussions and differential diagnosis and for many minutes myself listening to her other health issues and work-ups in [place] and locally we decided on the following.  She will start on daily Macrodantin and prescription sent.  I sent today's urine for culture.  I will get a hematuria CT scan and call her if the report is abnormal.  I will recheck on her in 7 weeks.  She is in the interim she likely will proceed with another colonoscopy.  She had a test that says she may have bowel cancer but apparently is a difficult colonoscopy.  She does not tolerate medication well and gets pressure and perhaps a rash with sulfa  Today Last culture was positive.  She was given Valium at her request prior to CT scan and it was normal.   Frequency is stable.  Clinically not infected but I sent the urine for culture.  She is going to try to have another colonoscopy.   PMH: Past Medical History:  Diagnosis Date  . Anterolisthesis    Cervical spine  . Asthma   . Connective tissue disorder (HCC)    recurrent carotid arteritis, temporal arteritis, vasculitis mandible, general hepatitis, avascular necrosis bilat  . DDD (degenerative disc disease), lumbar   . Diabetes mellitus without complication ([place])   . Diverticulosis   . Diverticulosis   . Duodenitis   . Gouty arthritis   . Hiatal hernia with GERD   . Hyperlipidemia   . Hypertension   . Hyperuricemia   . Keratitis sicca, bilateral   . Lymphedema of both lower extremities   . Osteoarthritis   . Pericarditis    Recurrent: Aug 97, July 05, Sept 08, July 16, July 18  . PUD (peptic ulcer disease)   . 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 Sjogren's syndrome ([place])   . Sjogren's syndrome ([place])   . Syncope   . Tendonitis, Achilles, right   . Tortuous colon   . Trigger finger     Surgical History: Past Surgical History:  Procedure Laterality Date  . ABDOMINAL HYSTERECTOMY    . BREAST BIOPSY    . CHOLECYSTECTOMY    . CYSTOSCOPY  02/26/2018   [name], MD   . distal arthrectomy    .  EXCISION NEUROMA    . KNEE SURGERY Bilateral    1985, 2001, 11/2002, 12/2006  . panhysterectomy  10/1983  . SHOULDER SURGERY Right    reverse total arthroplasty w/ biceps tenodesis  . TONSILLECTOMY AND ADENOIDECTOMY    . TOTAL HIP ARTHROPLASTY Right 04/2007  . WISDOM TOOTH EXTRACTION      Home Medications:  Allergies as of 07/21/2018      Reactions   Amlodipine    Other reaction(s): Unknown   Aspirin    Other reaction(s): Unknown   Bee Venom    Ciprofloxacin    Other reaction(s): Other (see comments), Unknown   Codeine    Other reaction(s): Unknown Must have pre medications before taking per patient report Includes all derivatives   Erythromycin    Other reaction(s): Unknown,  Unknown   Glimepiride    Other reaction(s): Unknown   Hydralazine    Other reaction(s): Unknown   Hydrochlorothiazide    Other reaction(s): Dizziness or lightheadedness INCREASED CRANIAL PRESSURE   Hydrocodone    Other reaction(s): Unknown Must have pre medications before taking per patient report   Hydrocodone-acetaminophen Nausea And Vomiting   MUST BE PREMEDICATED   Hydromorphone Nausea And Vomiting   Other reaction(s): Unknown Must have pre medications before taking per patient report MUST BE PREMEDICATED   Irbesartan    Other reaction(s): Unknown   Irbesartan-hydrochlorothiazide    Other reaction(s): Unknown   Lisinopril    Other reaction(s): Unknown   Maxitrol [neomycin-polymyxin-dexameth]    Metformin And Related    Metformin Hcl    Other reaction(s): Unknown   Methylcellulose    Other reaction(s): Unknown   Metoclopramide Nausea And Vomiting   Other reaction(s): Unknown   Metoprolol    Other reaction(s): Unknown   Neomycin-bacitracin Zn-polymyx    Other reaction(s): Unknown   Norvasc [amlodipine Besylate]    Nsaids Other (See Comments)   Other reaction(s): Unknown bleeding   Omeprazole Magnesium    Other reaction(s): Unknown   Other    Pt is allergic to staples and surgical metals   Oxycodone-acetaminophen    Other reaction(s): Unknown, Unknown Must have pre medications before taking per patient report   Penicillins    Other reaction(s): Unknown, Unknown   Pioglitazone    Other reaction(s): Unknown   Poison [place] Extract    Poison [place] and [place] also   Prilosec [omeprazole]    Silver Sulfadiazine    Other reaction(s): Unknown   Sulfa Antibiotics    Other reaction(s): Unknown, Unknown   Tape    Other reaction(s): Unknown   Tetanus Toxoid Other (See Comments)   Tramadol    Other reaction(s): Unknown Must have pre medications before taking per patient report   Betadine [povidone Iodine] Rash   Only topically when left on skin.      Medication List         Accurate as of July 21, 2018  2:31 PM. Always use your most recent med list.        colchicine 0.6 MG tablet 0.6 mg every 4 (four) hours as needed   cyclobenzaprine 5 MG tablet Commonly known as:  FLEXERIL TAKE 1 TABLET (5 MG TOTAL) BY MOUTH ONCE DAILY AS NEEDED FOR MUSCLE SPASMS   freestyle lancets Must test x4/day   FREESTYLE LITE test strip Generic drug:  glucose blood Use as directed three times daily for diabetes.   FREESTYLE LITE test strip Generic drug:  glucose blood Use as directed three times daily for diabetes.  PRECISION QID TEST test strip Generic drug:  glucose blood Use as directed twice daily for diabetes monitoring.   insulin degludec 100 UNIT/ML Sopn FlexTouch Pen Commonly known as:  TRESIBA FLEXTOUCH Inject 0.56 mLs (56 Units total) into the skin daily.   insulin lispro 100 UNIT/ML injection Commonly known as:  HUMALOG Administer 10 units at meals and 1 unit per 20 over 140 (up to 50 units per day total).   INSULIN SYRINGE .5CC/31GX5/16" 31G X 5/16" 0.5 ML Misc Inject x3/day   lactase 3000 units tablet Commonly known as:  LACTAID Take by mouth.   nebivolol 10 MG tablet Commonly known as:  BYSTOLIC Take 2.5 tablets (25 mg total) by mouth daily.   nitrofurantoin 100 MG capsule Commonly known as:  MACRODANTIN Take 1 capsule (100 mg total) by mouth daily.   Pen Needles 32G X 5 MM Misc 1 Device by Does not apply route daily.   prednisoLONE acetate 1 % ophthalmic suspension Commonly known as:  PRED FORTE INSTILL ONE DROP  as needed   ranitidine 300 MG tablet Commonly known as:  ZANTAC Take 300 mg by mouth 2 (two) times daily.   rosuvastatin 10 MG tablet Commonly known as:  CRESTOR Take by mouth.   Vitamin D3 25 MCG (1000 UT) Caps Take by mouth.       Allergies:  Allergies  Allergen Reactions  . Amlodipine     Other reaction(s): Unknown  . Aspirin     Other reaction(s): Unknown  . Bee Venom   . Ciprofloxacin      Other reaction(s): Other (see comments), Unknown  . Codeine     Other reaction(s): Unknown Must have pre medications before taking per patient report Includes all derivatives   . Erythromycin     Other reaction(s): Unknown, Unknown  . Glimepiride     Other reaction(s): Unknown  . Hydralazine     Other reaction(s): Unknown  . Hydrochlorothiazide     Other reaction(s): Dizziness or lightheadedness INCREASED CRANIAL PRESSURE  . Hydrocodone     Other reaction(s): Unknown Must have pre medications before taking per patient report  . Hydrocodone-Acetaminophen Nausea And Vomiting    MUST BE PREMEDICATED  . Hydromorphone Nausea And Vomiting    Other reaction(s): Unknown Must have pre medications before taking per patient report MUST BE PREMEDICATED   . Irbesartan     Other reaction(s): Unknown  . Irbesartan-Hydrochlorothiazide     Other reaction(s): Unknown  . Lisinopril     Other reaction(s): Unknown  . Maxitrol [Neomycin-Polymyxin-Dexameth]   . Metformin And Related   . Metformin Hcl     Other reaction(s): Unknown  . Methylcellulose     Other reaction(s): Unknown  . Metoclopramide Nausea And Vomiting    Other reaction(s): Unknown  . Metoprolol     Other reaction(s): Unknown  . Neomycin-Bacitracin Zn-Polymyx     Other reaction(s): Unknown  . Norvasc [Amlodipine Besylate]   . Nsaids Other (See Comments)    Other reaction(s): Unknown bleeding   . Omeprazole Magnesium     Other reaction(s): Unknown  . Other     Pt is allergic to staples and surgical metals  . Oxycodone-Acetaminophen     Other reaction(s): Unknown, Unknown Must have pre medications before taking per patient report   . Penicillins     Other reaction(s): Unknown, Unknown  . Pioglitazone     Other reaction(s): Unknown  . Poison [employer]     Poison [place] and [place] also  . Prilosec [  Omeprazole]   . Silver Sulfadiazine     Other reaction(s): Unknown  . Sulfa Antibiotics     Other reaction(s):  Unknown, Unknown  . Tape     Other reaction(s): Unknown  . Tetanus Toxoid Other (See Comments)  . Tramadol     Other reaction(s): Unknown Must have pre medications before taking per patient report  . Betadine [Povidone Iodine] Rash    Only topically when left on skin.    Family History: No family history on file.  Social History:  reports that she quit smoking about 22 years ago. She has never used smokeless tobacco. She reports current alcohol use. No history on file for drug.  ROS: UROLOGY Frequent Urination?: No Hard to postpone urination?: No Burning/pain with urination?: No Get up at night to urinate?: No Leakage of urine?: No Urine stream starts and stops?: No Trouble starting stream?: No Do you have to strain to urinate?: No Blood in urine?: No Urinary tract infection?: No Sexually transmitted disease?: No Injury to kidneys or bladder?: No Painful intercourse?: No Weak stream?: No Currently pregnant?: No Vaginal bleeding?: No Last menstrual period?: n  Gastrointestinal Nausea?: Yes Vomiting?: No Indigestion/heartburn?: Yes Diarrhea?: No Constipation?: No  Constitutional Fever: No Night sweats?: No Weight loss?: No Fatigue?: No  Skin Skin rash/lesions?: No Itching?: No  Eyes Blurred vision?: No Double vision?: No  Ears/Nose/Throat Sore throat?: No Sinus problems?: No  Hematologic/Lymphatic Swollen glands?: No Easy bruising?: No  Cardiovascular Leg swelling?: Yes Chest pain?: No  Respiratory Cough?: No Shortness of breath?: No  Endocrine Excessive thirst?: No  Musculoskeletal Back pain?: No Joint pain?: Yes  Neurological Headaches?: No Dizziness?: No  Psychologic Depression?: No Anxiety?: No  Physical Exam: BP (!) 146/84 (BP Location: Left Arm, Patient Position: Sitting)   Pulse 75   Ht 5\' 9"  (1.753 m)   Wt 240 lb (108.9 kg)   BMI 35.44 kg/m   Constitutional:  Alert and oriented, No acute distress.  Laboratory  Data: Lab Results  Component Value Date   WBC 11.0 (H) 05/26/2018   HGB 15.2 05/26/2018   HCT 44.6 05/26/2018   MCV 82.9 05/26/2018   PLT 307 05/26/2018    Lab Results  Component Value Date   CREATININE 1.03 (H) 05/26/2018    No results found for: PSA  No results found for: TESTOSTERONE  Lab Results  Component Value Date   HGBA1C 7.6 (A) 05/22/2018    Urinalysis    Component Value Date/Time   APPEARANCEUR Clear 06/09/2018 1356   GLUCOSEU Negative 06/09/2018 1356   BILIRUBINUR Negative 06/09/2018 1356   PROTEINUR Negative 06/09/2018 1356   NITRITE Negative 06/09/2018 1356   LEUKOCYTESUR Negative 06/09/2018 1356    Pertinent Imaging:   Assessment & Plan: Reassess on daily Macrodantin in 6 months clinically not having infections  There are no diagnoses linked to this encounter.  Return in about 6 months (around 01/19/2019) for 6 months dr Matilde Sprang.  Reece Packer, MD  [facility] [address], [place], [place] [address] [phone]

## 2018-07-21 NOTE — Telephone Encounter (Signed)
FYI --   Pt did not wish to schedule appt at check out, states she will call to schedule around her surgery.    FYI

## 2018-07-22 ENCOUNTER — Other Ambulatory Visit: Payer: Self-pay | Admitting: Nurse Practitioner

## 2018-07-22 DIAGNOSIS — K219 Gastro-esophageal reflux disease without esophagitis: Secondary | ICD-10-CM

## 2018-07-22 DIAGNOSIS — K449 Diaphragmatic hernia without obstruction or gangrene: Principal | ICD-10-CM

## 2018-07-22 LAB — URINALYSIS, COMPLETE
BILIRUBIN UA: NEGATIVE
Glucose, UA: NEGATIVE
Ketones, UA: NEGATIVE
Leukocytes, UA: NEGATIVE
Nitrite, UA: NEGATIVE
Protein, UA: NEGATIVE
RBC UA: NEGATIVE
Specific Gravity, UA: 1.025 (ref 1.005–1.030)
Urobilinogen, Ur: 0.2 mg/dL (ref 0.2–1.0)
pH, UA: 5.5 (ref 5.0–7.5)

## 2018-07-22 LAB — MICROSCOPIC EXAMINATION
Bacteria, UA: NONE SEEN
RBC, UA: NONE SEEN /hpf (ref 0–2)
WBC, UA: NONE SEEN /hpf (ref 0–5)

## 2018-07-22 NOTE — Telephone Encounter (Signed)
Pt saw Dr. Anabel Bene cardio and received clearance for any procedures.  He did not want to increase bystolic.  Please send refill for bystolic 20 mg to [facility].  Her call back number is [phone]

## 2018-07-22 NOTE — Telephone Encounter (Signed)
Incoming message

## 2018-07-22 NOTE — Telephone Encounter (Signed)
Patient needs to keep Cardiology management of her hypertensive medications and ask them to continue prescribing.  Patient has elevated BP readings and needs alternative reduction to prevent long term complications.  If advised otherwise, patient's specialist needs to assume this responsibility.  We can address together at patient's next appointment.

## 2018-07-23 LAB — CULTURE, URINE COMPREHENSIVE

## 2018-07-24 MED ORDER — RANITIDINE HCL 300 MG PO TABS: 300.0000 mg | ORAL_TABLET | Freq: Two times a day (BID) | ORAL | 5 refills | Status: DC

## 2018-07-24 NOTE — Telephone Encounter (Signed)
The pt was notified of recommendation. She stated that the Cardiologist want manage her blood pressure medication, because he doesn't see her on a regular basis and he doesn't manage medications.   She is also asking for a refill on her Zantac 300MG  to be sent to [facility].

## 2018-07-28 ENCOUNTER — Other Ambulatory Visit: Payer: Self-pay | Admitting: Nurse Practitioner

## 2018-07-28 DIAGNOSIS — E11649 Type 2 diabetes mellitus with hypoglycemia without coma: Secondary | ICD-10-CM

## 2018-07-29 ENCOUNTER — Other Ambulatory Visit: Payer: Self-pay

## 2018-07-30 NOTE — Telephone Encounter (Signed)
I spoke with Ander Purpura and she verbalize that she filled the patient prescriptions and will f/u with her concerning her blood pressure medications and management at her next visit.

## 2018-08-04 ENCOUNTER — Encounter: Payer: PRIVATE HEALTH INSURANCE | Admitting: Surgery

## 2018-08-07 ENCOUNTER — Ambulatory Visit (INDEPENDENT_AMBULATORY_CARE_PROVIDER_SITE_OTHER): Payer: Medicare Other | Admitting: Gastroenterology

## 2018-08-07 ENCOUNTER — Encounter: Payer: Self-pay | Admitting: Gastroenterology

## 2018-08-07 ENCOUNTER — Other Ambulatory Visit: Payer: Self-pay

## 2018-08-07 VITALS — BP 188/80 | HR 65 | Ht 69.0 in | Wt 244.8 lb

## 2018-08-07 DIAGNOSIS — K219 Gastro-esophageal reflux disease without esophagitis: Secondary | ICD-10-CM

## 2018-08-07 NOTE — Progress Notes (Signed)
Primary Care Physician: Mikey College, NP  Primary Gastroenterologist:  Dr. Lucilla Lame  No chief complaint on file.   HPI: [name] is a 73 y.o. female here with a history of having colon polyps in the past.  The patient has had unsuccessful attempts at doing a colonoscopy and had a Cologuard sent off which was reported to be positive.  The patient was subsequently found to have carotid stenosis and was questioning whether she could undergo sedation for her colonoscopy.  The patient was told that she would have to be cleared by her cardiologist or primary care provider.  Patient had seen me in the past for abdominal pain.  The patient abdominal pain was in the right side when she had seen me last.  On physical exam was consistent with musculoskeletal pain. The patient is now here for follow-up after getting clearance from her cardiologist and [place] to undergo a colonoscopy.  The patient also reports that she would like to be set up for an EGD because she has a history of heartburn, hiatal hernia, gastritis and duodenitis.  Current Outpatient Medications  Medication Sig Dispense Refill  . BD INSULIN SYRINGE U/F 31G X 5/16" 1 ML MISC USE AS DIRECTED. WITH HUMALOG 100 each 5  . Cholecalciferol (VITAMIN D3) 25 MCG (1000 UT) CAPS Take by mouth.    . colchicine 0.6 MG tablet 0.6 mg every 4 (four) hours as needed    . cyclobenzaprine (FLEXERIL) 5 MG tablet TAKE 1 TABLET (5 MG TOTAL) BY MOUTH ONCE DAILY AS NEEDED FOR MUSCLE SPASMS 30 tablet 2  . glucose blood (FREESTYLE LITE) test strip Use as directed three times daily for diabetes.    Marland Kitchen glucose blood (FREESTYLE LITE) test strip Use as directed three times daily for diabetes.    Marland Kitchen glucose blood (PRECISION QID TEST) test strip Use as directed twice daily for diabetes monitoring.    . insulin degludec (TRESIBA FLEXTOUCH) 100 UNIT/ML SOPN FlexTouch Pen Inject 0.56 mLs (56 Units total) into the skin daily. 6 pen 2  . insulin lispro  (HUMALOG) 100 UNIT/ML injection Administer 10 units at meals and 1 unit per 20 over 140 (up to 50 units per day total). (Patient taking differently: Administer 10 units at meals and 1 unit per 10 over 140 (up to 50 units per day total).) 20 mL 2  . Insulin Pen Needle (PEN NEEDLES) 32G X 5 MM MISC 1 Device by Does not apply route daily. 100 each 3  . lactase (LACTAID) 3000 units tablet Take by mouth.    . Lancets (FREESTYLE) lancets Must test x4/day    . nebivolol (BYSTOLIC) 10 MG tablet Take 2.5 tablets (25 mg total) by mouth daily. 75 tablet 1  . nitrofurantoin (MACRODANTIN) 100 MG capsule Take 1 capsule (100 mg total) by mouth daily. 30 capsule 11  . prednisoLONE acetate (PRED FORTE) 1 % ophthalmic suspension INSTILL ONE DROP  as needed    . ranitidine (ZANTAC) 300 MG tablet Take 1 tablet (300 mg total) by mouth 2 (two) times daily. 60 tablet 5  . rosuvastatin (CRESTOR) 10 MG tablet Take by mouth.     No current facility-administered medications for this visit.     Allergies as of 08/07/2018 - Review Complete 07/21/2018  Allergen Reaction Noted  . Amlodipine  08/02/2014  . Aspirin  02/26/2018  . Bee venom  08/02/2014  . Ciprofloxacin  07/25/2014  . Codeine  08/02/2014  . Erythromycin  07/25/2014  . Glimepiride  08/02/2014  . Hydralazine  11/28/2016  . Hydrochlorothiazide  02/21/2016  . Hydrocodone  02/26/2018  . Hydrocodone-acetaminophen Nausea And Vomiting 03/11/2017  . Hydromorphone Nausea And Vomiting 03/11/2017  . Irbesartan  08/02/2014  . Irbesartan-hydrochlorothiazide  08/02/2014  . Lisinopril  08/02/2014  . Maxitrol [neomycin-polymyxin-dexameth]  04/22/2018  . Metformin and related  04/22/2018  . Metformin hcl  02/26/2018  . Methylcellulose  02/26/2018  . Metoclopramide Nausea And Vomiting 08/02/2014  . Metoprolol  08/02/2014  . Neomycin-bacitracin zn-polymyx  03/11/2017  . Norvasc [amlodipine besylate]  04/22/2018  . Nsaids Other (See Comments) 07/25/2014  .  Omeprazole magnesium  02/26/2018  . Other  08/02/2014  . Oxycodone-acetaminophen  07/25/2014  . Penicillins  07/25/2014  . Pioglitazone  08/02/2014  . Poison sumac extract  07/27/2014  . Prilosec [omeprazole]  04/22/2018  . Silver sulfadiazine  07/27/2014  . Sulfa antibiotics  07/25/2014  . Tape  07/27/2014  . Tetanus toxoid Other (See Comments) 05/11/2015  . Tramadol  02/26/2018  . Betadine [povidone iodine] Rash 04/22/2018    ROS:  General: Negative for anorexia, weight loss, fever, chills, fatigue, weakness. ENT: Negative for hoarseness, difficulty swallowing , nasal congestion. CV: Negative for chest pain, angina, palpitations, dyspnea on exertion, peripheral edema.  Respiratory: Negative for dyspnea at rest, dyspnea on exertion, cough, sputum, wheezing.  GI: See history of present illness. GU:  Negative for dysuria, hematuria, urinary incontinence, urinary frequency, nocturnal urination.  Endo: Negative for unusual weight change.    Physical Examination:   There were no vitals taken for this visit.  General: Well-nourished, well-developed in no acute distress.  Eyes: No icterus. Conjunctivae pink. Mouth: Oropharyngeal mucosa moist and pink , no lesions erythema or exudate. Lungs: Clear to auscultation bilaterally. Non-labored. Heart: Regular rate and rhythm, no murmurs rubs or gallops.  Abdomen: Bowel sounds are normal, nontender, nondistended, no hepatosplenomegaly or masses, no abdominal bruits or hernia , no rebound or guarding.   Extremities: No lower extremity edema. No clubbing or deformities. Neuro: Alert and oriented x 3.  Grossly intact. Skin: Warm and dry, no jaundice.   Psych: Alert and cooperative, normal mood and affect.  Labs:    Imaging Studies: No results found.  Assessment and Plan:   [name] is a 73 y.o. y/o female who comes in with a history of a positive cologuard and a report that her colonoscopy could not be accomplished in the past due  to a tortuous colon. The patient will be set up for an EGD and colonoscopy since she has now had clearance for these procedures by her cardiologist in [place]. I have discussed risks & benefits which include, but are not limited to, bleeding, infection, perforation & drug reaction.  The patient agrees with this plan & written consent will be obtained.       Lucilla Lame, MD. [facility]   Note: This dictation was prepared with Dragon dictation along with smaller phrase technology. Any transcriptional errors that result from this process are unintentional.

## 2018-08-07 NOTE — H&P (View-Only) (Signed)
Primary Care Physician: Mikey College, NP  Primary Gastroenterologist:  Dr. Lucilla Lame  No chief complaint on file.   HPI: [name] is a 73 y.o. female here with a history of having colon polyps in the past.  The patient has had unsuccessful attempts at doing a colonoscopy and had a Cologuard sent off which was reported to be positive.  The patient was subsequently found to have carotid stenosis and was questioning whether she could undergo sedation for her colonoscopy.  The patient was told that she would have to be cleared by her cardiologist or primary care provider.  Patient had seen me in the past for abdominal pain.  The patient abdominal pain was in the right side when she had seen me last.  On physical exam was consistent with musculoskeletal pain. The patient is now here for follow-up after getting clearance from her cardiologist and [place] to undergo a colonoscopy.  The patient also reports that she would like to be set up for an EGD because she has a history of heartburn, hiatal hernia, gastritis and duodenitis.  Current Outpatient Medications  Medication Sig Dispense Refill  . BD INSULIN SYRINGE U/F 31G X 5/16" 1 ML MISC USE AS DIRECTED. WITH HUMALOG 100 each 5  . Cholecalciferol (VITAMIN D3) 25 MCG (1000 UT) CAPS Take by mouth.    . colchicine 0.6 MG tablet 0.6 mg every 4 (four) hours as needed    . cyclobenzaprine (FLEXERIL) 5 MG tablet TAKE 1 TABLET (5 MG TOTAL) BY MOUTH ONCE DAILY AS NEEDED FOR MUSCLE SPASMS 30 tablet 2  . glucose blood (FREESTYLE LITE) test strip Use as directed three times daily for diabetes.    Marland Kitchen glucose blood (FREESTYLE LITE) test strip Use as directed three times daily for diabetes.    Marland Kitchen glucose blood (PRECISION QID TEST) test strip Use as directed twice daily for diabetes monitoring.    . insulin degludec (TRESIBA FLEXTOUCH) 100 UNIT/ML SOPN FlexTouch Pen Inject 0.56 mLs (56 Units total) into the skin daily. 6 pen 2  . insulin lispro  (HUMALOG) 100 UNIT/ML injection Administer 10 units at meals and 1 unit per 20 over 140 (up to 50 units per day total). (Patient taking differently: Administer 10 units at meals and 1 unit per 10 over 140 (up to 50 units per day total).) 20 mL 2  . Insulin Pen Needle (PEN NEEDLES) 32G X 5 MM MISC 1 Device by Does not apply route daily. 100 each 3  . lactase (LACTAID) 3000 units tablet Take by mouth.    . Lancets (FREESTYLE) lancets Must test x4/day    . nebivolol (BYSTOLIC) 10 MG tablet Take 2.5 tablets (25 mg total) by mouth daily. 75 tablet 1  . nitrofurantoin (MACRODANTIN) 100 MG capsule Take 1 capsule (100 mg total) by mouth daily. 30 capsule 11  . prednisoLONE acetate (PRED FORTE) 1 % ophthalmic suspension INSTILL ONE DROP  as needed    . ranitidine (ZANTAC) 300 MG tablet Take 1 tablet (300 mg total) by mouth 2 (two) times daily. 60 tablet 5  . rosuvastatin (CRESTOR) 10 MG tablet Take by mouth.     No current facility-administered medications for this visit.     Allergies as of 08/07/2018 - Review Complete 07/21/2018  Allergen Reaction Noted  . Amlodipine  08/02/2014  . Aspirin  02/26/2018  . Bee venom  08/02/2014  . Ciprofloxacin  07/25/2014  . Codeine  08/02/2014  . Erythromycin  07/25/2014  . Glimepiride  08/02/2014  . Hydralazine  11/28/2016  . Hydrochlorothiazide  02/21/2016  . Hydrocodone  02/26/2018  . Hydrocodone-acetaminophen Nausea And Vomiting 03/11/2017  . Hydromorphone Nausea And Vomiting 03/11/2017  . Irbesartan  08/02/2014  . Irbesartan-hydrochlorothiazide  08/02/2014  . Lisinopril  08/02/2014  . Maxitrol [neomycin-polymyxin-dexameth]  04/22/2018  . Metformin and related  04/22/2018  . Metformin hcl  02/26/2018  . Methylcellulose  02/26/2018  . Metoclopramide Nausea And Vomiting 08/02/2014  . Metoprolol  08/02/2014  . Neomycin-bacitracin zn-polymyx  03/11/2017  . Norvasc [amlodipine besylate]  04/22/2018  . Nsaids Other (See Comments) 07/25/2014  .  Omeprazole magnesium  02/26/2018  . Other  08/02/2014  . Oxycodone-acetaminophen  07/25/2014  . Penicillins  07/25/2014  . Pioglitazone  08/02/2014  . Poison sumac extract  07/27/2014  . Prilosec [omeprazole]  04/22/2018  . Silver sulfadiazine  07/27/2014  . Sulfa antibiotics  07/25/2014  . Tape  07/27/2014  . Tetanus toxoid Other (See Comments) 05/11/2015  . Tramadol  02/26/2018  . Betadine [povidone iodine] Rash 04/22/2018    ROS:  General: Negative for anorexia, weight loss, fever, chills, fatigue, weakness. ENT: Negative for hoarseness, difficulty swallowing , nasal congestion. CV: Negative for chest pain, angina, palpitations, dyspnea on exertion, peripheral edema.  Respiratory: Negative for dyspnea at rest, dyspnea on exertion, cough, sputum, wheezing.  GI: See history of present illness. GU:  Negative for dysuria, hematuria, urinary incontinence, urinary frequency, nocturnal urination.  Endo: Negative for unusual weight change.    Physical Examination:   There were no vitals taken for this visit.  General: Well-nourished, well-developed in no acute distress.  Eyes: No icterus. Conjunctivae pink. Mouth: Oropharyngeal mucosa moist and pink , no lesions erythema or exudate. Lungs: Clear to auscultation bilaterally. Non-labored. Heart: Regular rate and rhythm, no murmurs rubs or gallops.  Abdomen: Bowel sounds are normal, nontender, nondistended, no hepatosplenomegaly or masses, no abdominal bruits or hernia , no rebound or guarding.   Extremities: No lower extremity edema. No clubbing or deformities. Neuro: Alert and oriented x 3.  Grossly intact. Skin: Warm and dry, no jaundice.   Psych: Alert and cooperative, normal mood and affect.  Labs:    Imaging Studies: No results found.  Assessment and Plan:   [name] is a 73 y.o. y/o female who comes in with a history of a positive cologuard and a report that her colonoscopy could not be accomplished in the past due  to a tortuous colon. The patient will be set up for an EGD and colonoscopy since she has now had clearance for these procedures by her cardiologist in [place]. I have discussed risks & benefits which include, but are not limited to, bleeding, infection, perforation & drug reaction.  The patient agrees with this plan & written consent will be obtained.       Lucilla Lame, MD. [facility]   Note: This dictation was prepared with Dragon dictation along with smaller phrase technology. Any transcriptional errors that result from this process are unintentional.

## 2018-08-08 ENCOUNTER — Encounter: Payer: Self-pay | Admitting: Nurse Practitioner

## 2018-08-08 ENCOUNTER — Other Ambulatory Visit: Payer: Self-pay

## 2018-08-08 ENCOUNTER — Other Ambulatory Visit: Payer: Self-pay | Admitting: Nurse Practitioner

## 2018-08-08 DIAGNOSIS — K219 Gastro-esophageal reflux disease without esophagitis: Secondary | ICD-10-CM

## 2018-08-08 DIAGNOSIS — E1140 Type 2 diabetes mellitus with diabetic neuropathy, unspecified: Secondary | ICD-10-CM

## 2018-08-08 MED ORDER — GLUCOSE BLOOD VI STRP: ORAL_STRIP | 4 refills | Status: DC

## 2018-08-08 MED ORDER — INSULIN LISPRO 100 UNIT/ML ~~LOC~~ SOLN: SUBCUTANEOUS | 2 refills | Status: DC

## 2018-08-08 NOTE — Telephone Encounter (Signed)
Pt needs a refill on humalog and test strips sent to [facility].

## 2018-08-09 ENCOUNTER — Other Ambulatory Visit: Payer: Self-pay | Admitting: Nurse Practitioner

## 2018-08-09 DIAGNOSIS — E1140 Type 2 diabetes mellitus with diabetic neuropathy, unspecified: Secondary | ICD-10-CM

## 2018-08-09 MED ORDER — GLUCOSE BLOOD VI STRP: ORAL_STRIP | 4 refills | Status: DC

## 2018-08-22 ENCOUNTER — Encounter: Payer: Self-pay | Admitting: Nurse Practitioner

## 2018-08-22 ENCOUNTER — Other Ambulatory Visit: Payer: Self-pay | Admitting: Nurse Practitioner

## 2018-08-22 ENCOUNTER — Ambulatory Visit (INDEPENDENT_AMBULATORY_CARE_PROVIDER_SITE_OTHER): Payer: Medicare Other | Admitting: Nurse Practitioner

## 2018-08-22 ENCOUNTER — Other Ambulatory Visit: Payer: Self-pay

## 2018-08-22 VITALS — BP 175/76 | HR 63 | Temp 98.5°F | Wt 245.0 lb

## 2018-08-22 DIAGNOSIS — E1140 Type 2 diabetes mellitus with diabetic neuropathy, unspecified: Secondary | ICD-10-CM

## 2018-08-22 DIAGNOSIS — R928 Other abnormal and inconclusive findings on diagnostic imaging of breast: Secondary | ICD-10-CM | POA: Diagnosis not present

## 2018-08-22 DIAGNOSIS — E11649 Type 2 diabetes mellitus with hypoglycemia without coma: Secondary | ICD-10-CM | POA: Diagnosis not present

## 2018-08-22 DIAGNOSIS — I10 Essential (primary) hypertension: Secondary | ICD-10-CM

## 2018-08-22 DIAGNOSIS — R1011 Right upper quadrant pain: Secondary | ICD-10-CM

## 2018-08-22 LAB — POCT GLYCOSYLATED HEMOGLOBIN (HGB A1C): Hemoglobin A1C: 7.5 % — AB (ref 4.0–5.6)

## 2018-08-22 MED ORDER — CYCLOBENZAPRINE HCL 5 MG PO TABS: ORAL_TABLET | ORAL | 2 refills | Status: DC

## 2018-08-22 MED ORDER — HYDROCHLOROTHIAZIDE 12.5 MG PO TABS: 12.5000 mg | ORAL_TABLET | Freq: Every day | ORAL | 3 refills | Status: DC

## 2018-08-22 NOTE — Telephone Encounter (Signed)
Pharmacy sent a fax requesting refills. Please review. Thanks!

## 2018-08-22 NOTE — Progress Notes (Signed)
Subjective:    Patient ID: [name], female    DOB: 16-Mar-1946, 73 y.o.   MRN: [id number]  [name] is a 73 y.o. female presenting on 08/22/2018 for Diabetes (Patient comes in today for a follow up. She was started on Tresiba on last visit, and she is tolerating medication well. She reports that fasting BS average in the high 90s.) and Hypertension (Patient reports that blood pressures have been running high. )  HPI Diabetes Pt presents today for follow up of Type 2 diabetes mellitus. She is checking regular preprandial CBGs. Notes occasional lower blood sugars: fasting am at 75 and 85 was asymptomatic.  Patient has had no overnight episodes of hypoglycemia.  Patient also reports no consistent daytime lows, but does have symptoms of low blood sugar at least 1-2 times per week.    - Patient has had a few weeks with 2 hr post meals CBG checks.  Has elevations after pasta meals, so avoids these and eats them less than once per month.   - Continues Humalog with every meal.   - She denies polydipsia, polyphagia, polyuria, headaches, diaphoresis, shakiness, chills, pain, numbness or tingling in extremities and changes in vision.   - Clinical course has been unchanged. - She  reports no regular exercise routine. - Her diet is moderate in salt, moderate in fat, and moderate in carbohydrates.  She eats very few vegetables. - Weight trend: stable  PREVENTION: Eye exam current (within one year): yes Foot exam current (within one year): yes  Lipid/ASCVD risk reduction - on statin: rosuvastatin Kidney protection - on ace or arb: no Recent Labs    05/22/18 1408 08/22/18 1347  HGBA1C 7.6* 7.5*   Hypertension - She is checking BP at home or outside of clinic.   Readings regularly 170-190/100 - Current medications: Bystolic only, tolerating well without side effects - She is not currently symptomatic. - Pt denies headache, lightheadedness, dizziness, changes in vision, chest  tightness/pressure, palpitations, leg swelling, sudden loss of speech or loss of consciousness.  Social History   Tobacco Use  . Smoking status: Former Smoker    Last attempt to quit: 01/22/1996    Years since quitting: 22.5  . Smokeless tobacco: Never Used  Substance Use Topics  . Alcohol use: Yes    Comment: occasional  . Drug use: Not on file    Review of Systems Per HPI unless specifically indicated above     Objective:    BP (!) 175/76 (BP Location: Right Arm, Patient Position: Sitting, Cuff Size: Large)   Pulse 63   Temp 98.5 F (36.9 C)   Wt 245 lb (111.1 kg)   BMI 36.18 kg/m   Wt Readings from Last 3 Encounters:  08/22/18 245 lb (111.1 kg)  08/07/18 244 lb 12.8 oz (111 kg)  07/21/18 240 lb (108.9 kg)    Physical Exam Vitals signs reviewed.  Constitutional:      General: She is not in acute distress.    Appearance: She is well-developed.  HENT:     Head: Normocephalic and atraumatic.  Cardiovascular:     Rate and Rhythm: Normal rate and regular rhythm.     Pulses:          Radial pulses are 2+ on the right side and 2+ on the left side.       Posterior tibial pulses are 1+ on the right side and 1+ on the left side.     Heart sounds: Normal heart sounds, S1  normal and S2 normal.  Pulmonary:     Effort: Pulmonary effort is normal. No respiratory distress.     Breath sounds: Normal breath sounds and air entry.  Musculoskeletal:     Right lower leg: No edema.     Left lower leg: No edema.  Skin:    General: Skin is warm and dry.     Capillary Refill: Capillary refill takes less than 2 seconds.  Neurological:     Mental Status: She is alert and oriented to person, place, and time.  Psychiatric:        Attention and Perception: Attention normal.        Mood and Affect: Affect normal. Mood is anxious.        Speech: Speech is rapid and pressured and tangential.        Behavior: Behavior normal. Behavior is cooperative.        Thought Content: Thought  content normal.        Judgment: Judgment normal.    Results for orders placed or performed in visit on 08/22/18  POCT glycosylated hemoglobin (Hb A1C)  Result Value Ref Range   Hemoglobin A1C 7.5 (A) 4.0 - 5.6 %   HbA1c POC (<> result, manual entry)     HbA1c, POC (prediabetic range)     HbA1c, POC (controlled diabetic range)        Assessment & Plan:   Problem List Items Addressed This Visit      Cardiovascular and Mediastinum   Essential hypertension Uncontrolled hypertension.  BP goal < 130/80.  Pt is not currently working on lifestyle modifications.  Taking medications tolerating well without side effects. Complications: Patient has multiple drug intolerances that are complicating management.  Plan: 1. Continue taking Bystolic 20 mg daily - again, prefer to consider dose increase. Patient was advised against this by a cardiologist, but that cardiologist did not start anything else or provide any additional advice for management as it was consultatory only, driven by patient when she returned to [place]. - Discussed long-term effects of uncontrolled BP with patient.  Need to start another agent.  Patient is willing to try HCTZ despite past side effects.  Likely due to hypotension, but control is now worsening. - START HCTZ 12.5 mg once daily 2. Obtain labs - recheck CMP in 2-3 weeks after starting med for potassium, renal monitoring 2. Encouraged heart healthy diet and increasing exercise to 30 minutes most days of the week. 4. Check BP 1-2 x per week at home, keep log, and bring to clinic at next appointment. 5. Discussed referral to cardiology for hypertension management - patient agrees to proceed. 6. Follow up 4-6 weeks.     Relevant Medications   hydrochlorothiazide (HYDRODIURIL) 12.5 MG tablet   Other Relevant Orders   COMPLETE METABOLIC PANEL WITH GFR   VAS US RENAL ARTERY DUPLEX   Ambulatory referral to Cardiology    Other Visit Diagnoses    Uncontrolled type 2  diabetes mellitus with hypoglycemia without coma ([place])    -  Primary Continues to have suboptimally controlled T2 DM with A1c 7.5% stable from 3 months ago and goal A1c < 7.0%. - Complications - peripheral neuropathy, hyperglycemia, hypoglycemia and cataracts.  Plan:  1. Change therapy:  - Continue Tresiba 56 units daily - Continue Humalog correction scale without changes - Change meal dosing to carb counting dose.  For every one (1) 15g carb serving, administer 5 units Humalog.  Max 15 units for meal dosing per meal.  2. Encourage improved lifestyle: - low carb/low glycemic diet reinforced prior education - Increase physical activity to 30 minutes most days of the week.  Explained that increased physical activity increases body's use of sugar for energy. 3. Check fasting am CBG and bring log to next visit for review 4. Continue ASA and Statin 5. Recommend urine microalbumin - pt declines 6. Follow-up 3 months.    Relevant Orders   POCT glycosylated hemoglobin (Hb A1C) (Completed)   Abnormal mammogram     Past history of abnormal mammo LEFT breast.  Needs rescreening.  Order placed.   Relevant Orders   MM Digital Diagnostic Unilat L      Meds ordered this encounter  Medications  . hydrochlorothiazide (HYDRODIURIL) 12.5 MG tablet    Sig: Take 1 tablet (12.5 mg total) by mouth daily.    Dispense:  90 tablet    Refill:  3    Order Specific Question:   Supervising Provider    Answer:   Olin Hauser [[id number]]   Follow up plan: Return in about 3 months (around 11/22/2018) for diabetes and 4-6 weeks for blood pressure if not at goals.  Cassell Smiles, DNP, AGPCNP-BC Adult Gerontology Primary Care Nurse Practitioner [facility] 08/22/2018, 1:48 PM

## 2018-08-22 NOTE — Patient Instructions (Addendum)
[name],   Thank you for coming in to clinic today.  1. Blood pressure - START hydrochlorothiazide 12.5 mg daily in am. - Watch out for lightheaded/dizzy  2. Diabetes: - Continue Tresiba - Humalog: Change your dosing slightly: 5 units humalog per 1 carb serving of 15 g up to total of 15 units max per meal.   - THEN continue with adding correction scale of 1 unit per 20 over 140.  Imaging center will call for mammogram follow-up and separately for renal artery ultrasound.  Please schedule a follow-up appointment with Cassell Smiles, AGNP. Return in about 3 months (around 11/22/2018) for diabetes and 4-6 weeks for blood pressure if not at goals.  If you have any other questions or concerns, please feel free to call the clinic or send a message through [name]. You may also schedule an earlier appointment if necessary.  You will receive a survey after today's visit either digitally by e-mail or paper by [employer]. Your experiences and feedback matter to [place].  Please respond so we know how we are doing as we provide care for you.  Cassell Smiles, DNP, AGNP-BC Adult Gerontology Nurse Practitioner [place]

## 2018-08-25 ENCOUNTER — Other Ambulatory Visit: Payer: Self-pay

## 2018-08-25 MED ORDER — NA SULFATE-K SULFATE-MG SULF 17.5-3.13-1.6 GM/177ML PO SOLN: 1.0000 | ORAL | 0 refills | Status: DC

## 2018-08-26 ENCOUNTER — Other Ambulatory Visit: Payer: Self-pay

## 2018-08-26 ENCOUNTER — Encounter
Admission: RE | Disposition: A | Discharge status: Home / Self Care | Payer: Self-pay | Source: Home / Self Care | Attending: Gastroenterology

## 2018-08-26 ENCOUNTER — Ambulatory Visit: Payer: Medicare Other | Admitting: Anesthesiology

## 2018-08-26 ENCOUNTER — Ambulatory Visit
Admission: RE | Admit: 2018-08-26 | Discharge: 2018-08-26 | Disposition: A | Discharge status: Home / Self Care | Payer: Medicare Other | Attending: Gastroenterology | Admitting: Gastroenterology

## 2018-08-26 DIAGNOSIS — Z794 Long term (current) use of insulin: Secondary | ICD-10-CM | POA: Insufficient documentation

## 2018-08-26 DIAGNOSIS — K640 First degree hemorrhoids: Secondary | ICD-10-CM | POA: Insufficient documentation

## 2018-08-26 DIAGNOSIS — K2900 Acute gastritis without bleeding: Secondary | ICD-10-CM | POA: Diagnosis not present

## 2018-08-26 DIAGNOSIS — K6389 Other specified diseases of intestine: Secondary | ICD-10-CM | POA: Diagnosis not present

## 2018-08-26 DIAGNOSIS — J4520 Mild intermittent asthma, uncomplicated: Secondary | ICD-10-CM | POA: Diagnosis not present

## 2018-08-26 DIAGNOSIS — M3500 Sicca syndrome, unspecified: Secondary | ICD-10-CM | POA: Insufficient documentation

## 2018-08-26 DIAGNOSIS — R195 Other fecal abnormalities: Secondary | ICD-10-CM | POA: Insufficient documentation

## 2018-08-26 DIAGNOSIS — K5730 Diverticulosis of large intestine without perforation or abscess without bleeding: Secondary | ICD-10-CM | POA: Diagnosis not present

## 2018-08-26 DIAGNOSIS — E119 Type 2 diabetes mellitus without complications: Secondary | ICD-10-CM | POA: Diagnosis not present

## 2018-08-26 DIAGNOSIS — M109 Gout, unspecified: Secondary | ICD-10-CM | POA: Diagnosis not present

## 2018-08-26 DIAGNOSIS — K2950 Unspecified chronic gastritis without bleeding: Secondary | ICD-10-CM | POA: Diagnosis not present

## 2018-08-26 DIAGNOSIS — I6529 Occlusion and stenosis of unspecified carotid artery: Secondary | ICD-10-CM | POA: Insufficient documentation

## 2018-08-26 DIAGNOSIS — Z79899 Other long term (current) drug therapy: Secondary | ICD-10-CM | POA: Insufficient documentation

## 2018-08-26 DIAGNOSIS — I10 Essential (primary) hypertension: Secondary | ICD-10-CM | POA: Diagnosis not present

## 2018-08-26 DIAGNOSIS — K219 Gastro-esophageal reflux disease without esophagitis: Secondary | ICD-10-CM | POA: Insufficient documentation

## 2018-08-26 DIAGNOSIS — K449 Diaphragmatic hernia without obstruction or gangrene: Secondary | ICD-10-CM | POA: Insufficient documentation

## 2018-08-26 DIAGNOSIS — E1140 Type 2 diabetes mellitus with diabetic neuropathy, unspecified: Secondary | ICD-10-CM | POA: Diagnosis not present

## 2018-08-26 DIAGNOSIS — Z8711 Personal history of peptic ulcer disease: Secondary | ICD-10-CM | POA: Diagnosis not present

## 2018-08-26 DIAGNOSIS — K319 Disease of stomach and duodenum, unspecified: Secondary | ICD-10-CM | POA: Insufficient documentation

## 2018-08-26 DIAGNOSIS — K529 Noninfective gastroenteritis and colitis, unspecified: Secondary | ICD-10-CM | POA: Insufficient documentation

## 2018-08-26 DIAGNOSIS — K5790 Diverticulosis of intestine, part unspecified, without perforation or abscess without bleeding: Secondary | ICD-10-CM | POA: Diagnosis not present

## 2018-08-26 DIAGNOSIS — K3189 Other diseases of stomach and duodenum: Secondary | ICD-10-CM | POA: Insufficient documentation

## 2018-08-26 DIAGNOSIS — Z87891 Personal history of nicotine dependence: Secondary | ICD-10-CM | POA: Insufficient documentation

## 2018-08-26 DIAGNOSIS — E785 Hyperlipidemia, unspecified: Secondary | ICD-10-CM | POA: Insufficient documentation

## 2018-08-26 DIAGNOSIS — K2970 Gastritis, unspecified, without bleeding: Secondary | ICD-10-CM | POA: Diagnosis not present

## 2018-08-26 HISTORY — PX: ESOPHAGOGASTRODUODENOSCOPY (EGD) WITH PROPOFOL: SHX5813

## 2018-08-26 HISTORY — PX: COLONOSCOPY WITH PROPOFOL: SHX5780

## 2018-08-26 LAB — GLUCOSE, CAPILLARY: Glucose-Capillary: 127 mg/dL — ABNORMAL HIGH (ref 70–99)

## 2018-08-26 SURGERY — COLONOSCOPY WITH PROPOFOL
Anesthesia: General

## 2018-08-26 MED ORDER — LIDOCAINE HCL (PF) 2 % IJ SOLN
INTRAMUSCULAR | Status: AC
  Filled 2018-08-26: qty 10

## 2018-08-26 MED ORDER — PHENYLEPHRINE HCL 10 MG/ML IJ SOLN
INTRAMUSCULAR | Status: DC | PRN
  Administered 2018-08-26: 100 ug via INTRAVENOUS

## 2018-08-26 MED ORDER — PROPOFOL 500 MG/50ML IV EMUL
INTRAVENOUS | Status: DC | PRN
  Administered 2018-08-26: 120 ug/kg/min via INTRAVENOUS

## 2018-08-26 MED ORDER — PROPOFOL 500 MG/50ML IV EMUL
INTRAVENOUS | Status: AC
  Filled 2018-08-26: qty 50

## 2018-08-26 MED ORDER — LIDOCAINE HCL (CARDIAC) PF 100 MG/5ML IV SOSY
PREFILLED_SYRINGE | INTRAVENOUS | Status: DC | PRN
  Administered 2018-08-26: 100 mg via INTRAVENOUS

## 2018-08-26 MED ORDER — SODIUM CHLORIDE 0.9 % IV SOLN
INTRAVENOUS | Status: DC
  Administered 2018-08-26: 1000 mL via INTRAVENOUS

## 2018-08-26 MED ORDER — PROPOFOL 10 MG/ML IV BOLUS
INTRAVENOUS | Status: DC | PRN
  Administered 2018-08-26: 100 mg via INTRAVENOUS
  Administered 2018-08-26: 30 mg via INTRAVENOUS

## 2018-08-26 NOTE — Op Note (Signed)
[facility] Gastroenterology Patient Name: [name] Procedure Date: 08/26/2018 10:14 AM MRN: [id number] Account #: [id number] Date of Birth: 1945/08/07 Admit Type: Outpatient Age: 73 Room: [facility] ENDO ROOM 4 Gender: Female Note Status: Finalized Procedure:            Upper GI endoscopy Indications:          Heartburn Providers:            Lucilla Lame MD, MD Referring MD:         [employer] (Referring MD) Medicines:            Propofol per Anesthesia Complications:        No immediate complications. Procedure:            Pre-Anesthesia Assessment:                       - Prior to the procedure, a History and Physical was                        performed, and patient medications and allergies were                        reviewed. The patient's tolerance of previous                        anesthesia was also reviewed. The risks and benefits of                        the procedure and the sedation options and risks were                        discussed with the patient. All questions were                        answered, and informed consent was obtained. Prior                        Anticoagulants: The patient has taken no previous                        anticoagulant or antiplatelet agents. ASA Grade                        Assessment: II - A patient with mild systemic disease.                        After reviewing the risks and benefits, the patient was                        deemed in satisfactory condition to undergo the                        procedure.                       After obtaining informed consent, the endoscope was                        passed under direct vision. Throughout the procedure,  the patient's blood pressure, pulse, and oxygen                        saturations were monitored continuously. The Endoscope                        was introduced through the mouth, and advanced to the   second part of duodenum. The upper GI endoscopy was                        accomplished without difficulty. The patient tolerated                        the procedure well. Findings:      A small hiatal hernia was present.      Localized moderate inflammation characterized by erythema was found in       the gastric antrum. Biopsies were taken with a cold forceps for       histology.      A single 10 mm submucosal papule (nodule) with no bleeding and no       stigmata of recent bleeding was found in the gastric body.      The examined duodenum was normal. Impression:           - Small hiatal hernia.                       - Gastritis. Biopsied.                       - A single submucosal papule (nodule) found in the                        stomach.                       - Normal examined duodenum. Recommendation:       - Await pathology results.                       - Perform an upper endoscopic ultrasound (UEUS) at                        appointment to be scheduled. Procedure Code(s):    --- Professional ---                       [phone], Esophagogastroduodenoscopy, flexible, transoral;                        with biopsy, single or multiple Diagnosis Code(s):    --- Professional ---                       R12, Heartburn                       K31.89, Other diseases of stomach and duodenum                       K29.70, Gastritis, unspecified, without bleeding CPT copyright 2018 American Medical Association. All rights reserved. The codes documented in this report are preliminary and upon coder review may  be revised to meet current compliance requirements. Lucilla Lame MD, MD  08/26/2018 10:30:04 AM This report has been signed electronically. Number of Addenda: 0 Note Initiated On: 08/26/2018 10:14 AM      [facility]

## 2018-08-26 NOTE — Anesthesia Preprocedure Evaluation (Signed)
Anesthesia Evaluation  Patient identified by MRN, date of birth, ID band Patient awake    Reviewed: Allergy & Precautions, NPO status , Patient's Chart, lab work & pertinent test results  History of Anesthesia Complications Negative for: history of anesthetic complications  Airway Mallampati: II  TM Distance: >3 FB Neck ROM: Full    Dental no notable dental hx.    Pulmonary asthma , former smoker,    breath sounds clear to auscultation- rhonchi (-) wheezing      Cardiovascular hypertension, Pt. on medications (-) CAD, (-) Past MI, (-) Cardiac Stents and (-) CABG  Rhythm:Regular Rate:Normal - Systolic murmurs and - Diastolic murmurs    Neuro/Psych neg Seizures negative neurological ROS  negative psych ROS   GI/Hepatic Neg liver ROS, PUD, GERD  ,  Endo/Other  diabetes, Insulin Dependent  Renal/GU negative Renal ROS     Musculoskeletal  (+) Arthritis ,   Abdominal (+) + obese,   Peds  Hematology negative hematology ROS (+)   Anesthesia Other Findings Past Medical History: No date: Anterolisthesis     Comment:  Cervical spine No date: Asthma No date: Connective tissue disorder (HCC)     Comment:  recurrent carotid arteritis, temporal arteritis,               vasculitis mandible, general hepatitis, avascular               necrosis bilat No date: DDD (degenerative disc disease), lumbar No date: Diabetes mellitus without complication (HCC) No date: Diverticulosis No date: Diverticulosis No date: Duodenitis No date: Gouty arthritis No date: Hiatal hernia with GERD No date: Hyperlipidemia No date: Hypertension No date: Hyperuricemia 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, bilateral No date: Lymphedema of both lower extremities No date: Osteoarthritis No date: Pericarditis     Comment:  Recurrent: Aug 97, July 05, Sept 08, July 16, July 18 No date: PUD (peptic ulcer disease) No date: 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[place]) No date: Sjogren's  syndrome ([place]) No date: Sjogren's syndrome ([place]) No date: Syncope No date: Tendonitis, Achilles, right No date: Tortuous colon No date: Trigger finger   Reproductive/Obstetrics                             Anesthesia Physical Anesthesia Plan  ASA: III  Anesthesia Plan: General   Post-op Pain Management:    Induction: Intravenous  PONV Risk Score and Plan: 2 and Propofol infusion  Airway Management Planned: Natural Airway  Additional Equipment:   Intra-op Plan:   Post-operative Plan:   Informed Consent: I have reviewed the patients History and Physical, chart, labs and discussed the procedure including the risks, benefits and alternatives for the proposed anesthesia with the patient or authorized representative who has indicated his/her understanding and acceptance.     Dental advisory given  Plan Discussed with: CRNA and Anesthesiologist  Anesthesia Plan Comments:         Anesthesia Quick Evaluation

## 2018-08-26 NOTE — Op Note (Signed)
[facility] Gastroenterology Patient Name: [name] Procedure Date: 08/26/2018 10:12 AM MRN: [id number] Account #: [id number] Date of Birth: November 18, 1945 Admit Type: Outpatient Age: 73 Room: [facility] ENDO ROOM 4 Gender: Female Note Status: Finalized Procedure:            Colonoscopy Indications:          Positive Cologuard test Providers:            Lucilla Lame MD, MD Referring MD:         [employer] (Referring MD) Medicines:            Propofol per Anesthesia Complications:        No immediate complications. Procedure:            Pre-Anesthesia Assessment:                       - Prior to the procedure, a History and Physical was                        performed, and patient medications and allergies were                        reviewed. The patient's tolerance of previous                        anesthesia was also reviewed. The risks and benefits of                        the procedure and the sedation options and risks were                        discussed with the patient. All questions were                        answered, and informed consent was obtained. Prior                        Anticoagulants: The patient has taken no previous                        anticoagulant or antiplatelet agents. ASA Grade                        Assessment: II - A patient with mild systemic disease.                        After reviewing the risks and benefits, the patient was                        deemed in satisfactory condition to undergo the                        procedure.                       After obtaining informed consent, the colonoscope was                        passed under direct vision. Throughout the procedure,  the patient's blood pressure, pulse, and oxygen                        saturations were monitored continuously. The                        Colonoscope was introduced through the anus and                        advanced  to the the terminal ileum. The colonoscopy was                        performed without difficulty. The patient tolerated the                        procedure well. The quality of the bowel preparation                        was excellent. Findings:      The perianal and digital rectal examinations were normal.      A scattered area of granular mucosa was found in the transverse colon.       Biopsies were taken with a cold forceps for histology.      Multiple small-mouthed diverticula were found in the sigmoid colon.      The terminal ileum appeared normal.      Non-bleeding internal hemorrhoids were found during retroflexion. The       hemorrhoids were Grade I (internal hemorrhoids that do not prolapse). Impression:           - Granularity in the transverse colon. Biopsied.                       - Diverticulosis in the sigmoid colon.                       - The examined portion of the ileum was normal.                       - Non-bleeding internal hemorrhoids. Recommendation:       - Discharge patient to home.                       - Resume previous diet.                       - Continue present medications.                       - Await pathology results.                       - Repeat colonoscopy in 5 years for surveillance. Procedure Code(s):    --- Professional ---                       [phone], Colonoscopy, flexible; with biopsy, single or                        multiple Diagnosis Code(s):    --- Professional ---                       R19.5, Other fecal  abnormalities                       K63.89, Other specified diseases of intestine CPT copyright 2018 American Medical Association. All rights reserved. The codes documented in this report are preliminary and upon coder review may  be revised to meet current compliance requirements. Lucilla Lame MD, MD 08/26/2018 10:53:43 AM This report has been signed electronically. Number of Addenda: 0 Note Initiated On: 08/26/2018 10:12 AM Scope  Withdrawal Time: 0 hours 6 minutes 22 seconds  Total Procedure Duration: 0 hours 20 minutes 21 seconds       [facility]

## 2018-08-26 NOTE — Anesthesia Postprocedure Evaluation (Signed)
Anesthesia Post Note  Patient: [name]  Procedure(s) Performed: COLONOSCOPY WITH PROPOFOL (N/A ) ESOPHAGOGASTRODUODENOSCOPY (EGD) WITH PROPOFOL (N/A )  Patient location during evaluation: Endoscopy Anesthesia Type: General Level of consciousness: awake and alert and oriented Pain management: pain level controlled Vital Signs Assessment: post-procedure vital signs reviewed and stable Respiratory status: spontaneous breathing, nonlabored ventilation and respiratory function stable Cardiovascular status: blood pressure returned to baseline and stable Postop Assessment: no signs of nausea or vomiting Anesthetic complications: no     Last Vitals:  Vitals:   08/26/18 1114 08/26/18 1117  BP: (!) 116/58 (!) 109/57  Pulse: 70 69  Resp: 15 15  Temp:    SpO2: 99% 96%    Last Pain:  Vitals:   08/26/18 1058  TempSrc: Tympanic  PainSc:                  [name]

## 2018-08-26 NOTE — Interval H&P Note (Signed)
History and Physical Interval Note:  08/26/2018 10:00 AM  [name]  has presented today for surgery, with the diagnosis of Positive cologuard R19.5 GERD K21.9.  The various methods of treatment have been discussed with the patient and family. After consideration of risks, benefits and other options for treatment, the patient has consented to  Procedure(s): COLONOSCOPY WITH PROPOFOL (N/A) ESOPHAGOGASTRODUODENOSCOPY (EGD) WITH PROPOFOL (N/A) as a surgical intervention.  The patient's history has been reviewed, patient examined, no change in status, stable for surgery.  I have reviewed the patient's chart and labs.  Questions were answered to the patient's satisfaction.     [name] [employer]

## 2018-08-26 NOTE — Anesthesia Post-op Follow-up Note (Signed)
Anesthesia QCDR form completed.        

## 2018-08-26 NOTE — Transfer of Care (Signed)
Immediate Anesthesia Transfer of Care Note  Patient: [name]  Procedure(s) Performed: COLONOSCOPY WITH PROPOFOL (N/A ) ESOPHAGOGASTRODUODENOSCOPY (EGD) WITH PROPOFOL (N/A )  Patient Location: Endoscopy Unit  Anesthesia Type:General  Level of Consciousness: drowsy and patient cooperative  Airway & Oxygen Therapy: Patient Spontanous Breathing and Patient connected to nasal cannula oxygen  Post-op Assessment: Report given to RN and Post -op Vital signs reviewed and stable  Post vital signs: Reviewed and stable  Last Vitals:  Vitals Value Taken Time  BP 100/68 08/26/2018 10:58 AM  Temp    Pulse 65 08/26/2018 10:58 AM  Resp 15 08/26/2018 10:58 AM  SpO2 97 % 08/26/2018 10:58 AM  Vitals shown include unvalidated device data.  Last Pain:  Vitals:   08/26/18 0950  TempSrc: Tympanic  PainSc: 0-No pain         Complications: No apparent anesthesia complications

## 2018-08-27 ENCOUNTER — Telehealth: Payer: Self-pay | Admitting: Gastroenterology

## 2018-08-27 ENCOUNTER — Telehealth: Payer: Self-pay | Admitting: Nurse Practitioner

## 2018-08-27 ENCOUNTER — Encounter: Payer: Self-pay | Admitting: Gastroenterology

## 2018-08-27 LAB — SURGICAL PATHOLOGY

## 2018-08-27 NOTE — Telephone Encounter (Signed)
Pt is calling  In regards to a Referral for another procedure that Dr. Allen Norris recommended  Due to finding a Lump in her stomach please call pt regarding this Referral

## 2018-08-27 NOTE — Telephone Encounter (Signed)
Patient has questions about a "mass found during colonoscopy."  All questions and additional steps will need to be originated from Dr. Dorothey Baseman office.  Happy to help coordinate follow-up, but I will need final reports from her colonoscopy.  I see patient has a call over to Dr. Dorothey Baseman office.  Defer initial next steps to them for now.

## 2018-08-27 NOTE — Telephone Encounter (Signed)
Pt. Called requesting that you call her.

## 2018-08-28 NOTE — Telephone Encounter (Signed)
Patient has called again today 08/28/2018 regarding the same issue.  Patient still needs to wait for GI follow-up as Dr. Allen Norris knows what type of mass he saw and next steps that are recommended.  Patient may schedule a consultative office visit here if needed after I receive additional information about the lump/mass in question.  I will not speak with her about this issue to proceed with new treatment without additional information as it would not be best practice.

## 2018-08-28 NOTE — Telephone Encounter (Signed)
I called and spoke with the patient and express to [name] recommendation. The pt was very persistent and stated she was going to just come up to the office and [name] for one minute. I stress to her that Ander Purpura was busy and since her concerns is related to her endoscopy Ander Purpura has already recommended that she f/u with Dr. Verl Blalock. She said she doesn't want to talk to him, because she is already upset with him.

## 2018-08-28 NOTE — Telephone Encounter (Signed)
Message forwarded to Dr. Allen Norris for advise.

## 2018-08-29 ENCOUNTER — Telehealth: Payer: Self-pay

## 2018-08-29 ENCOUNTER — Encounter: Payer: Self-pay | Admitting: Nurse Practitioner

## 2018-08-29 NOTE — Telephone Encounter (Signed)
Pt notified of procedure results. Pt advised based off the findings, the EUS procedure will be delayed due to the COVID 19. I will contact her once the restrictions are lifted to get her schedule. Pt verbalized understanding of this information.

## 2018-08-29 NOTE — Telephone Encounter (Signed)
-----   Message from Lucilla Lame, MD sent at 08/28/2018  2:02 PM EDT ----- Let the patient know that the biopsies of the stomach did not show any infection cancer or precancerous tissue.  The biopsies of the colon showed some mild acute inflammation that was reported could be due to the patient's prep or another medication effect.  There is nothing to worry about with this biopsy.

## 2018-09-05 ENCOUNTER — Other Ambulatory Visit: Payer: Medicare Other

## 2018-09-05 DIAGNOSIS — I10 Essential (primary) hypertension: Secondary | ICD-10-CM | POA: Diagnosis not present

## 2018-09-06 LAB — COMPLETE METABOLIC PANEL WITH GFR
AG Ratio: 1.4 (calc) (ref 1.0–2.5)
ALT: 13 U/L (ref 6–29)
AST: 14 U/L (ref 10–35)
Albumin: 3.9 g/dL (ref 3.6–5.1)
Alkaline phosphatase (APISO): 86 U/L (ref 37–153)
BUN/Creatinine Ratio: 17 (calc) (ref 6–22)
BUN: 17 mg/dL (ref 7–25)
CO2: 31 mmol/L (ref 20–32)
Calcium: 9.5 mg/dL (ref 8.6–10.4)
Chloride: 101 mmol/L (ref 98–110)
Creat: 1 mg/dL — ABNORMAL HIGH (ref 0.60–0.93)
GFR, Est African American: 66 mL/min/{1.73_m2} (ref >=60)
GFR, Est Non African American: 57 mL/min/{1.73_m2} — ABNORMAL LOW (ref >=60)
Globulin: 2.8 g/dL (calc) (ref 1.9–3.7)
Glucose, Bld: 131 mg/dL — ABNORMAL HIGH (ref 65–99)
Potassium: 4.1 mmol/L (ref 3.5–5.3)
Sodium: 140 mmol/L (ref 135–146)
Total Bilirubin: 0.4 mg/dL (ref 0.2–1.2)
Total Protein: 6.7 g/dL (ref 6.1–8.1)

## 2018-09-09 ENCOUNTER — Telehealth: Payer: Self-pay | Admitting: Gastroenterology

## 2018-09-09 NOTE — Telephone Encounter (Signed)
Pt is calling she states [facility] will do the procedure that needs to bee done she needs a referral send to [facility]  Plus the test results and colorguard to   [facility] call [phone] and fax# [phone]

## 2018-09-10 NOTE — Telephone Encounter (Signed)
Left vm letting pt know referral was faxed to [place] per her request.

## 2018-09-12 ENCOUNTER — Telehealth: Payer: Self-pay | Admitting: Nurse Practitioner

## 2018-09-12 NOTE — Telephone Encounter (Signed)
The pt called to notify that she is scheduled to f/u with [facility] about the abdominal mass in her abdomen.

## 2018-09-12 NOTE — Telephone Encounter (Signed)
Thanks for the update.  NO changes needed at this time.

## 2018-09-12 NOTE — Telephone Encounter (Signed)
Pt wanted someone to call her back said that she was going to see if someone at [facility] could help her. (Mass in stomach). Pt  Call back # [phone]

## 2018-09-25 ENCOUNTER — Telehealth: Payer: Self-pay

## 2018-09-25 ENCOUNTER — Ambulatory Visit: Payer: Medicare Other | Admitting: Nurse Practitioner

## 2018-09-26 ENCOUNTER — Other Ambulatory Visit: Payer: Self-pay

## 2018-09-26 ENCOUNTER — Encounter: Payer: Self-pay | Admitting: Nurse Practitioner

## 2018-09-26 ENCOUNTER — Ambulatory Visit (INDEPENDENT_AMBULATORY_CARE_PROVIDER_SITE_OTHER): Payer: Medicare Other | Admitting: Nurse Practitioner

## 2018-09-26 DIAGNOSIS — I10 Essential (primary) hypertension: Secondary | ICD-10-CM | POA: Diagnosis not present

## 2018-09-26 DIAGNOSIS — R000 Tachycardia, unspecified: Secondary | ICD-10-CM | POA: Diagnosis not present

## 2018-09-26 NOTE — Telephone Encounter (Signed)
The pt called with concerns with her HCTZ. She states her blood pressure reading has improved, but she notice intermittent spells when she doesn't feel well and her heart rate goes up into the 90's.

## 2018-09-26 NOTE — Progress Notes (Signed)
Telemedicine Encounter: Disclosed to patient at start of encounter that we will provide appropriate telemedicine services.  Patient consents to be treated via phone prior to discussion. - Patient is at her home and is accessed via telephone. - Services are provided by Cassell Smiles from [facility].  Subjective:    Patient ID: [name], female    DOB: March 12, 1946, 73 y.o.   MRN: [id number]  [name] is a 73 y.o. female presenting on 09/26/2018 for Tachycardia  HPI Elevated HR/hypertension Patient is now taking HCTZ for about one month, notes again that "years ago" she didn't do well on it.  Gets dreams with this med and has "felt weird in my head" before.  Currently, patient is noting increases in HR without sensations of chest pounding, palpitations.  She again notes "I feel weird in my head," weak.  Occasionally checks pulse with 99 pulse at rest.  BP reading was around 111/68.   This occurs 1-2 times per week.  Normal resting HR in 60s. - BP is much improved with 151/92 at home today (highest reading she has had in "a long time").  - Pt denies headache, lightheadedness, dizziness, changes in vision, chest tightness/pressure, palpitations, leg swelling, sudden loss of speech or loss of consciousness. - Previously, patient had uncontrolled hypertension only on Bystolic due to past intolerances/allergy to hypertension medications. At appt in 08/2018, patient agreed to try a low dose of hctz again and has tolerated it fairly well to this point.  Electrolytes/kidney function were normal on recheck 2 weeks after starting the med.  Social History   Tobacco Use  . Smoking status: Former Smoker    Last attempt to quit: 01/22/1996    Years since quitting: 22.6  . Smokeless tobacco: Never Used  Substance Use Topics  . Alcohol use: Yes    Comment: occasional  . Drug use: Never   Review of Systems Per HPI unless specifically indicated above    Objective:     There were no vitals taken for this visit.  Wt Readings from Last 3 Encounters:  08/26/18 240 lb (108.9 kg)  08/22/18 245 lb (111.1 kg)  08/07/18 244 lb 12.8 oz (111 kg)    Physical Exam Patient remotely monitored.  Verbal communication appropriate.  Cognition normal.    Assessment & Plan:   Problem List Items Addressed This Visit      Cardiovascular and Mediastinum   Essential hypertension - Primary    Other Visit Diagnoses    Heart rate fast          Patient has had improvement on home readings of BP, sensations of increased resting HR occasionally that are concerning to patient.  Not clinically significant at this time due to not truly being tachycardia, associated with normotension, and patient being largely asymptomatic.  Plan: 1. Discussed that patient will have an adjustment period with fatigue as her body adjusts to lower BP on regular basis - adapting to new set point.  She is likely to continue having fatigue for additional 2-3 weeks.  Patient verbalizes understanding.  Can readdress HCTZ if symptoms persist > total of 8 weeks from start of medication since patient's side effects are not severe and are lower risk than uncontrolled BP. 2. Encouraged patient to stay regularly hydrated. 3. Last electrolytes back on HCTZ normal.  Can consider recheck of BMP, mag in future if symptoms persist. 4. Encouraged patient to continue with cardiology referral locally - delayed due to non-urgent  nature given Covid-19 social distancing precautions. 5. Follow-up prn and as scheduled for regular BP follow-up.  - Time spent in direct consultation with patient via telemedicine about above concerns: 12 minutes  Follow up plan: PRN and as scheduled.  Cassell Smiles, DNP, AGPCNP-BC Adult Gerontology Primary Care Nurse Practitioner [facility] 09/26/2018, 10:33 AM

## 2018-10-08 ENCOUNTER — Telehealth: Payer: Self-pay | Admitting: Cardiovascular Disease

## 2018-10-08 NOTE — Telephone Encounter (Signed)
Virtual Visit Pre-Appointment Phone Call  "(Name), I am calling you today to discuss your upcoming appointment. We are currently trying to limit exposure to the virus that causes COVID-19 by seeing patients at home rather than in the office."  1. "What is the BEST phone number to call the day of the visit?" - include this in appointment notes  2. Do you have or have access to (through a family member/friend) a smartphone with video capability that we can use for your visit?" a. If yes - list this number in appt notes as cell (if different from BEST phone #) and list the appointment type as a VIDEO visit in appointment notes b. If no - list the appointment type as a PHONE visit in appointment notes  3. Confirm consent - "In the setting of the current Covid19 crisis, you are scheduled for a (phone or video) visit with your provider on (date) at (time).  Just as we do with many in-office visits, in order for you to participate in this visit, we must obtain consent.  If you'd like, I can send this to your mychart (if signed up) or email for you to review.  Otherwise, I can obtain your verbal consent now.  All virtual visits are billed to your insurance company just like a normal visit would be.  By agreeing to a virtual visit, we'd like you to understand that the technology does not allow for your provider to perform an examination, and thus may limit your provider's ability to fully assess your condition. If your provider identifies any concerns that need to be evaluated in person, we will make arrangements to do so.  Finally, though the technology is pretty good, we cannot assure that it will always work on either your or our end, and in the setting of a video visit, we may have to convert it to a phone-only visit.  In either situation, we cannot ensure that we have a secure connection.  Are you willing to proceed?" STAFF: Did the patient verbally acknowledge consent to telehealth visit? Document  YES/NO here: YES  4. Advise patient to be prepared - "Two hours prior to your appointment, go ahead and check your blood pressure, pulse, oxygen saturation, and your weight (if you have the equipment to check those) and write them all down. When your visit starts, your provider will ask you for this information. If you have an Apple Watch or Kardia device, please plan to have heart rate information ready on the day of your appointment. Please have a pen and paper handy nearby the day of the visit as well."  5. Give patient instructions for MyChart download to smartphone OR Doximity/Doxy.me as below if video visit (depending on what platform provider is using)  6. Inform patient they will receive a phone call 15 minutes prior to their appointment time (may be from unknown caller ID) so they should be prepared to answer    TELEPHONE CALL NOTE  [name] has been deemed a candidate for a follow-up tele-health visit to limit community exposure during the Covid-19 pandemic. I spoke with the patient via phone to ensure availability of phone/video source, confirm preferred email & phone number, and discuss instructions and expectations.  I reminded [name] to be prepared with any vital sign and/or heart rhythm information that could potentially be obtained via home monitoring, at the time of her visit. I reminded [name] to expect a phone call prior to  her visit.  [name] 10/08/2018 4:31 PM   INSTRUCTIONS FOR DOWNLOADING THE MYCHART APP TO SMARTPHONE  - The patient must first make sure to have activated MyChart and know their login information - If Apple, go to [employer] and type in MyChart in the search bar and download the app. If Android, ask patient to go to [employer] and type in [place] in the search bar and download the app. The app is free but as with any other app downloads, their phone may require them to verify saved payment information or  Apple/Android password.  - The patient will need to then log into the app with their MyChart username and password, and select [facility] as their healthcare provider to link the account. When it is time for your visit, go to the MyChart app, find appointments, and click Begin Video Visit. Be sure to Select Allow for your device to access the Microphone and Camera for your visit. You will then be connected, and your provider will be with you shortly.  **If they have any issues connecting, or need assistance please contact MyChart service desk [phone])**  **If using a computer, in order to ensure the best quality for their visit they will need to use either of the following Internet Browsers: [employer], or Google Chrome**  IF USING DOXIMITY or DOXY.ME - The patient will receive a link just prior to their visit by text.     FULL LENGTH CONSENT FOR TELE-HEALTH VISIT   I hereby voluntarily request, consent and authorize [facility] and its employed or contracted physicians, physician assistants, nurse practitioners or other licensed health care professionals (the Practitioner), to provide me with telemedicine health care services (the Services") as deemed necessary by the treating Practitioner. I acknowledge and consent to receive the Services by the Practitioner via telemedicine. I understand that the telemedicine visit will involve communicating with the Practitioner through live audiovisual communication technology and the disclosure of certain medical information by electronic transmission. I acknowledge that I have been given the opportunity to request an in-person assessment or other available alternative prior to the telemedicine visit and am voluntarily participating in the telemedicine visit.  I understand that I have the right to withhold or withdraw my consent to the use of telemedicine in the course of my care at any time, without affecting my right to future care  or treatment, and that the Practitioner or I may terminate the telemedicine visit at any time. I understand that I have the right to inspect all information obtained and/or recorded in the course of the telemedicine visit and may receive copies of available information for a reasonable fee.  I understand that some of the potential risks of receiving the Services via telemedicine include:   Delay or interruption in medical evaluation due to technological equipment failure or disruption;  Information transmitted may not be sufficient (e.g. poor resolution of images) to allow for appropriate medical decision making by the Practitioner; and/or   In rare instances, security protocols could fail, causing a breach of personal health information.  Furthermore, I acknowledge that it is my responsibility to provide information about my medical history, conditions and care that is complete and accurate to the best of my ability. I acknowledge that Practitioner's advice, recommendations, and/or decision may be based on factors not within their control, such as incomplete or inaccurate data provided by me or distortions of diagnostic images or specimens that may result from electronic transmissions. I  understand that the practice of medicine is not an exact science and that Practitioner makes no warranties or guarantees regarding treatment outcomes. I acknowledge that I will receive a copy of this consent concurrently upon execution via email to the email address I last provided but may also request a printed copy by calling the office of [facility].    I understand that my insurance will be billed for this visit.   I have read or had this consent read to me.  I understand the contents of this consent, which adequately explains the benefits and risks of the Services being provided via telemedicine.   I have been provided ample opportunity to ask questions regarding this consent and the Services and have had  my questions answered to my satisfaction.  I give my informed consent for the services to be provided through the use of telemedicine in my medical care  By participating in this telemedicine visit I agree to the above.

## 2018-10-09 DIAGNOSIS — R195 Other fecal abnormalities: Secondary | ICD-10-CM

## 2018-10-09 DIAGNOSIS — K3189 Other diseases of stomach and duodenum: Secondary | ICD-10-CM | POA: Diagnosis not present

## 2018-10-12 DIAGNOSIS — I739 Peripheral vascular disease, unspecified: Secondary | ICD-10-CM | POA: Insufficient documentation

## 2018-10-12 DIAGNOSIS — I700 Atherosclerosis of aorta: Secondary | ICD-10-CM | POA: Insufficient documentation

## 2018-10-12 DIAGNOSIS — I6523 Occlusion and stenosis of bilateral carotid arteries: Secondary | ICD-10-CM | POA: Insufficient documentation

## 2018-10-12 NOTE — Progress Notes (Deleted)
{Choose 1 Note Type (Telehealth Visit or Telephone Visit):[phone]}   I connected with  [name] on 10/12/18 by a video enabled telemedicine application and verified that I am speaking with the correct person using two identifiers. I discussed the limitations of evaluation and management by telemedicine. The patient expressed understanding and agreed to proceed.   Evaluation Performed:  Follow-up visit  Date:  10/12/2018   ID:  [name], [name] [birth year], MRN [id number]  Patient Location:  [place] [address]   Provider location:   [facility], [place] office  PCP:  Mikey College, NP  Cardiologist:  Patsy Baltimore   Chief compliant  New patient  History of Present Illness:    [name] is a 73 y.o. female who presents via audio/video conferencing for a telehealth visit today.   The patient does not symptoms concerning for COVID-19 infection (fever, chills, cough, or new SHORTNESS OF BREATH).   Patient has a past medical history of GERD PAD, Carotid stenosis Uncontrolled diabetes type 2 Referred by [facility] Smiles for uncontrolled hypertension  Recently seen by primary care September 26, 2018 Patient has multiple drug intolerances that are complicating management. Previously tried HCTZ 12.5 mg daily with bystolic 20  Hemoglobin B2I 7.5   Prior CV studies:   The following studies were reviewed today:  Carotid ultrasound December 2019  less than 50% disease bilaterally  CT hematuria work-up January 2020 Noting aortic atherosclerosis     Past Medical History:  Diagnosis Date  . Anterolisthesis    Cervical spine  . Asthma   . Connective tissue disorder (HCC)    recurrent carotid arteritis, temporal arteritis, vasculitis mandible, general hepatitis, avascular necrosis bilat  . DDD (degenerative disc disease), lumbar   . Diabetes mellitus without complication ([place])   . Diverticulosis   .  Diverticulosis   . Duodenitis   . Gouty arthritis   . Hiatal hernia with GERD   . Hyperlipidemia   . Hypertension   . Hyperuricemia   . Keratitis sicca, bilateral   . Lymphedema of both lower extremities   . Osteoarthritis   . Pericarditis    Recurrent: Aug 97, July 05, Sept 08, July 16, July 18  . PUD (peptic ulcer disease)   . 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 Sjogren's syndrome ([place])   . Sjogren's syndrome ([place])   . Syncope   . Tendonitis, Achilles, right   . Tortuous colon   . Trigger finger    Past Surgical History:  Procedure Laterality Date  . ABDOMINAL HYSTERECTOMY    . BREAST BIOPSY    . CHOLECYSTECTOMY    . COLONOSCOPY WITH PROPOFOL N/A 08/26/2018   Procedure: COLONOSCOPY WITH PROPOFOL;  Surgeon: Lucilla Lame, MD;  Location: [facility] ENDOSCOPY;  Service: Endoscopy;  Laterality: N/A;  . CYSTOSCOPY  02/26/2018   [name], MD   . distal arthrectomy    . ESOPHAGOGASTRODUODENOSCOPY (EGD) WITH PROPOFOL N/A 08/26/2018   Procedure: ESOPHAGOGASTRODUODENOSCOPY (EGD) WITH PROPOFOL;  Surgeon: Lucilla Lame, MD;  Location[facility]COPY;  Service: Endoscopy;  Laterality: N/A;  . EXCISION NEUROMA    . KNEE SURGERY Bilateral    1985, 2001, 11/2002, 12/2006  . panhysterectomy  10/1983  . SHOULDER SURGERY Right    reverse total arthroplasty w/ biceps tenodesis  . TONSILLECTOMY AND ADENOIDECTOMY    . TOTAL HIP ARTHROPLASTY Right 04/2007  . WISDOM TOOTH EXTRACTION       No outpatient medications have been marked as  taking for the 10/14/18 encounter (Appointment) with Minna Merritts, MD.     Allergies:   Amlodipine; Aspirin; Bee venom; Ciprofloxacin; Codeine; Erythromycin; Glimepiride; Hydralazine; Hydrochlorothiazide; Hydrocodone; Hydrocodone-acetaminophen; Hydromorphone; Irbesartan; Irbesartan-hydrochlorothiazide; Lisinopril; Maxitrol [neomycin-polymyxin-dexameth]; Metformin and related; Metformin hcl; Methylcellulose; Metoclopramide; Metoprolol; Neomycin-bacitracin zn-polymyx; Norvasc  [amlodipine besylate]; Nsaids; Omeprazole magnesium; Other; Oxycodone-acetaminophen; Penicillins; Pioglitazone; Poison sumac extract; Prilosec [omeprazole]; Silver sulfadiazine; Sulfa antibiotics; Tape; Tetanus toxoid; Tramadol; and Betadine [povidone iodine]   Social History   Tobacco Use  . Smoking status: Former Smoker    Last attempt to quit: 01/22/1996    Years since quitting: 22.7  . Smokeless tobacco: Never Used  Substance Use Topics  . Alcohol use: Yes    Comment: occasional  . Drug use: Never     Current Outpatient Medications on File Prior to Visit  Medication Sig Dispense Refill  . BD INSULIN SYRINGE U/F 31G X 5/16" 1 ML MISC USE AS DIRECTED. WITH HUMALOG 100 each 5  . Cholecalciferol (VITAMIN D3) 25 MCG (1000 UT) CAPS Take by mouth.    . colchicine 0.6 MG tablet 0.6 mg every 4 (four) hours as needed    . cyclobenzaprine (FLEXERIL) 5 MG tablet TAKE 1 TABLET (5 MG TOTAL) BY MOUTH ONCE DAILY AS NEEDED FOR MUSCLE SPASMS 30 tablet 2  . glucose blood (FREESTYLE LITE) test strip Use as directed three times daily for diabetes. 100 each 4  . hydrochlorothiazide (HYDRODIURIL) 12.5 MG tablet Take 1 tablet (12.5 mg total) by mouth daily. 90 tablet 3  . insulin lispro (HUMALOG) 100 UNIT/ML injection Administer 10 units at meals and 1 unit per 20 over 140 (up to 50 units per day total). (Patient taking differently: 15 Units. Administer 10 units at meals and 1 unit per 20 over 140 (up to 50 units per day total).) 20 mL 2  . Insulin Pen Needle (PEN NEEDLES) 32G X 5 MM MISC 1 Device by Does not apply route daily. 100 each 3  . lactase (LACTAID) 3000 units tablet Take by mouth as needed.     . Lancets (FREESTYLE) lancets Must test x4/day    . Na Sulfate-K Sulfate-Mg Sulf (SUPREP BOWEL PREP KIT) 17.5-3.13-1.6 GM/177ML SOLN Take 1 kit by mouth as directed. 1 Bottle 0  . nebivolol (BYSTOLIC) 10 MG tablet Take 2.5 tablets (25 mg total) by mouth daily. (Patient not taking: Reported on 08/26/2018) 75  tablet 1  . nitrofurantoin (MACRODANTIN) 100 MG capsule Take 1 capsule (100 mg total) by mouth daily. 30 capsule 11  . prednisoLONE acetate (PRED FORTE) 1 % ophthalmic suspension INSTILL ONE DROP  as needed    . ranitidine (ZANTAC) 300 MG tablet Take 1 tablet (300 mg total) by mouth 2 (two) times daily. 60 tablet 5  . rosuvastatin (CRESTOR) 10 MG tablet Take by mouth.    . TRESIBA FLEXTOUCH 100 UNIT/ML SOPN FlexTouch Pen INJECT 56 UNITS SUBQ ONCE DAILY 18 mL 5   No current facility-administered medications on file prior to visit.      Family Hx: The patient's family history is not on file.  ROS:   Please see the history of present illness.    ROS    Labs/Other Tests and Data Reviewed:    Recent Labs: 05/26/2018: Hemoglobin 15.2; Platelets 307; TSH 1.17 09/05/2018: ALT 13; BUN 17; Creat 1.00; Potassium 4.1; Sodium 140   Recent Lipid Panel Lab Results  Component Value Date/Time   CHOL 197 05/26/2018 11:10 AM   TRIG 209 (H) 05/26/2018 11:10 AM   HDL  48 (L) 05/26/2018 11:10 AM   CHOLHDL 4.1 05/26/2018 11:10 AM   LDLCALC 116 (H) 05/26/2018 11:10 AM    Wt Readings from Last 3 Encounters:  08/26/18 240 lb (108.9 kg)  08/22/18 245 lb (111.1 kg)  08/07/18 244 lb 12.8 oz (111 kg)     Exam:    Vital Signs: Vital signs may also be detailed in the HPI There were no vitals taken for this visit.  Wt Readings from Last 3 Encounters:  08/26/18 240 lb (108.9 kg)  08/22/18 245 lb (111.1 kg)  08/07/18 244 lb 12.8 oz (111 kg)   Temp Readings from Last 3 Encounters:  08/26/18 97.6 F (36.4 C) (Tympanic)  08/22/18 98.5 F (36.9 C)  07/03/18 98.5 F (36.9 C) (Oral)   BP Readings from Last 3 Encounters:  08/26/18 (!) 109/57  08/22/18 (!) 175/76  08/07/18 (!) 188/80   Pulse Readings from Last 3 Encounters:  08/26/18 69  08/22/18 63  08/07/18 65     Well nourished, well developed female in no acute distress. Constitutional:  oriented to person, place, and time. No distress.   Head: Normocephalic and atraumatic.  Eyes:  no discharge. No scleral icterus.  Neck: Normal range of motion. Neck supple.  Pulmonary/Chest: No audible wheezing, no distress, appears comfortable Musculoskeletal: Normal range of motion.  no  tenderness or deformity.  Neurological:   Coordination normal. Full exam not performed Skin:  No rash Psychiatric:  normal mood and affect. behavior is normal. Thought content normal.    ASSESSMENT & PLAN:    No diagnosis found.   COVID-19 Education: The signs and symptoms of COVID-19 were discussed with the patient and how to seek care for testing (follow up with PCP or arrange E-visit).  The importance of social distancing was discussed today.  Patient Risk:   After full review of this patients clinical status, I feel that they are at least moderate risk at this time.  Time:   Today, I have spent 25 minutes with the patient with telehealth technology discussing the cardiac and medical problems/diagnoses detailed above   10 min spent reviewing the chart prior to patient visit today   Medication Adjustments/Labs and Tests Ordered: Current medicines are reviewed at length with the patient today.  Concerns regarding medicines are outlined above.   Tests Ordered: No tests ordered   Medication Changes: No changes made   Disposition: Follow-up in 6 months   Signed, Ida Rogue, MD  10/12/2018 4:24 PM    [place] Office [address], [place], [place] [address]

## 2018-10-13 ENCOUNTER — Other Ambulatory Visit: Payer: Self-pay | Admitting: Nurse Practitioner

## 2018-10-13 DIAGNOSIS — E1140 Type 2 diabetes mellitus with diabetic neuropathy, unspecified: Secondary | ICD-10-CM

## 2018-10-14 ENCOUNTER — Telehealth: Payer: Medicare Other | Admitting: Cardiovascular Disease

## 2018-10-16 ENCOUNTER — Other Ambulatory Visit: Payer: Self-pay

## 2018-10-16 DIAGNOSIS — E1140 Type 2 diabetes mellitus with diabetic neuropathy, unspecified: Secondary | ICD-10-CM

## 2018-10-18 ENCOUNTER — Encounter: Payer: Self-pay | Admitting: Emergency Medicine

## 2018-10-18 ENCOUNTER — Ambulatory Visit
Admission: EM | Admit: 2018-10-18 | Discharge: 2018-10-18 | Disposition: A | Discharge status: Home / Self Care | Payer: Medicare Other | Attending: Urgent Care | Admitting: Urgent Care

## 2018-10-18 ENCOUNTER — Other Ambulatory Visit: Payer: Self-pay

## 2018-10-18 DIAGNOSIS — N3000 Acute cystitis without hematuria: Secondary | ICD-10-CM | POA: Diagnosis not present

## 2018-10-18 LAB — URINALYSIS, COMPLETE (UACMP) WITH MICROSCOPIC
Bilirubin Urine: NEGATIVE
Glucose, UA: NEGATIVE mg/dL
Ketones, ur: NEGATIVE mg/dL
Nitrite: NEGATIVE
Protein, ur: NEGATIVE mg/dL
Specific Gravity, Urine: 1.01 (ref 1.005–1.030)
pH: 5.5 (ref 5.0–8.0)

## 2018-10-18 MED ORDER — DOXYCYCLINE HYCLATE 100 MG PO CAPS: 100.0000 mg | ORAL_CAPSULE | Freq: Two times a day (BID) | ORAL | 0 refills | Status: AC

## 2018-10-18 NOTE — ED Triage Notes (Signed)
Patient reports some discomfort when urinating and incontinence that started couple of days ago.

## 2018-10-18 NOTE — Discharge Instructions (Signed)
It was very nice meeting you today in clinic. Thank you for entrusting me with your care.   As discussed, your urine is POSITIVE for infection. Will approach treatment as follows:  Prescription has been sent to your pharmacy for antibiotics.  Please pick up and take as directed. FINISH the entire course of medication even if you are feeling better.  A culture will be sent on your provided sample. If it comes back resistant to what I have prescribed you, someone will call you and let you know that we will need to change antibiotics. Increase fluid intake as much as possible to flush your urinary tract.  Water is always the best.  Avoid caffeine until your infection clears up, as it can contribute to painful bladder spasms.  May use Tylenol and/or Ibuprofen as needed for pain/fever. May use Azo products (over the counter) to help with pain/discomfort.   Make arrangements to follow up with your regular doctor in 1 week for re-evaluation. If your symptoms/condition worsens, please seek follow up care either here or in the ER. Please remember, our [facility] providers are "right here with you" when you need [place].   Again, it was my pleasure to take care of you today. Thank you for choosing our clinic. I hope that you start to feel better quickly.   Honor Loh, MSN, APRN, FNP-C, CEN Advanced Practice Provider [facility]

## 2018-10-18 NOTE — ED Provider Notes (Signed)
[address] [place], [place] [address] [phone]   Name: [name] DOB: [birth year] MRN: [id number] CSN: [id number] PCP: Mikey College, NP  Arrival date and time:  10/18/18 1454  Chief Complaint:  Dysuria  NOTE: Prior to seeing the patient today, I have reviewed the triage nursing documentation and vital signs. Clinical staff has updated patient's PMH/PSHx, current medication list, and drug allergies/intolerances to ensure comprehensive history available to assist in medical decision making.   History:   HPI: [name] is a 73 y.o. female who presents today with complaints of dysuria that started earlier today. Patient notes a history of recurrent urinary tract infections for the last 2 years. She denies any frequency, urgency, or gross hematuria. (+) lower back pain; no associated suprapubic pain. She notes that she has been on chronic nitrofurantoin until recently for UTI prophylaxis. She was taken off of the medication because she developed significant itching.  Patient denies malodorous urine. She does not have any associated fevers.   Past Medical History:  Diagnosis Date  . Anterolisthesis    Cervical spine  . Asthma   . Connective tissue disorder (HCC)    recurrent carotid arteritis, temporal arteritis, vasculitis mandible, general hepatitis, avascular necrosis bilat  . DDD (degenerative disc disease), lumbar   . Diabetes mellitus without complication ([place])   . Diverticulosis   . Diverticulosis   . Duodenitis   . Gouty arthritis   . Hiatal hernia with GERD   . Hyperlipidemia   . Hypertension   . Hyperuricemia   . Keratitis sicca, bilateral   . Lymphedema of both lower extremities   . Osteoarthritis   . Pericarditis    Recurrent: Aug 97, July 05, Sept 08, July 16, July 18  . PUD (peptic ulcer disease)   . 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 Sjogren's syndrome ([place])   . Sjogren's syndrome ([place])   . Syncope   . Tendonitis, Achilles,  right   . Tortuous colon   . Trigger finger     Past Surgical History:  Procedure Laterality Date  . ABDOMINAL HYSTERECTOMY    . BREAST BIOPSY    . CHOLECYSTECTOMY    . COLONOSCOPY WITH PROPOFOL N/A 08/26/2018   Procedure: COLONOSCOPY WITH PROPOFOL;  Surgeon: Lucilla Lame, MD;  Location: [facility] ENDOSCOPY;  Service: Endoscopy;  Laterality: N/A;  . CYSTOSCOPY  02/26/2018   [name], MD   . distal arthrectomy    . ESOPHAGOGASTRODUODENOSCOPY (EGD) WITH PROPOFOL N/A 08/26/2018   Procedure: ESOPHAGOGASTRODUODENOSCOPY (EGD) WITH PROPOFOL;  Surgeon: Lucilla Lame, MD;  Location: [facility] ENDOSCOPY;  Service: Endoscopy;  Laterality: N/A;  . EXCISION NEUROMA    . KNEE SURGERY Bilateral    1985, 2001, 11/2002, 12/2006  . panhysterectomy  10/1983  . SHOULDER SURGERY Right    reverse total arthroplasty w/ biceps tenodesis  . TONSILLECTOMY AND ADENOIDECTOMY    . TOTAL HIP ARTHROPLASTY Right 04/2007  . WISDOM TOOTH EXTRACTION      History reviewed. No pertinent family history.  Social History   Socioeconomic History  . Marital status: Divorced    Spouse name: Not on file  . Number of children: Not on file  . Years of education: Not on file  . Highest education level: Associate degree: academic program  Occupational History  . Not on file  Social Needs  . Financial resource strain: Not hard at all  . Food insecurity:    Worry: Never true    Inability: Never true  . Transportation  needs:    Medical: No    Non-medical: No  Tobacco Use  . Smoking status: Former Smoker    Last attempt to quit: 01/22/1996    Years since quitting: 22.7  . Smokeless tobacco: Never Used  Substance and Sexual Activity  . Alcohol use: Yes    Comment: occasional  . Drug use: Never  . Sexual activity: Not on file  Lifestyle  . Physical activity:    Days per week: Not on file    Minutes per session: Not on file  . Stress: Not on file  Relationships  . Social connections:    Talks on phone: Not on file     Gets together: Not on file    Attends religious service: Not on file    Active member of club or organization: Not on file    Attends meetings of clubs or organizations: Not on file    Relationship status: Not on file  . Intimate partner violence:    Fear of current or ex partner: No    Emotionally abused: No    Physically abused: No    Forced sexual activity: No  Other Topics Concern  . Not on file  Social History Narrative  . Not on file    Patient Active Problem List   Diagnosis Date Noted  . PAD (peripheral artery disease) ([place]) 10/12/2018  . Aortic atherosclerosis ([place]) 10/12/2018  . Carotid stenosis, asymptomatic, bilateral 10/12/2018  . Gastroesophageal reflux disease   . Acute gastritis without hemorrhage   . Positive colorectal cancer screening using Cologuard test   . Controlled type 2 diabetes with neuropathy ([place]) 04/29/2018  . Osteoarthritis 04/29/2018  . Hiatal hernia with GERD 04/29/2018  . Hyperlipidemia 04/29/2018  . Lymphedema of both lower extremities 04/29/2018  . Left knee pain 10/23/2016  . Cataracts, bilateral 07/31/2016  . Glaucoma 07/31/2016  . Lateral epicondylitis of left elbow 10/20/2015  . Vitamin D deficiency 10/20/2015  . Gouty arthritis 06/03/2015  . H/O syncope 06/03/2015  . Mild intermittent asthma 06/03/2015  . Multiple gastric ulcers 06/03/2015  . Schatzki's ring 06/03/2015  . Undifferentiated connective tissue disease ([place]) 06/03/2015  . Bone disorder 04/05/2015  . Dental injury 04/05/2015  . Sjogren's syndrome ([place]) 09/07/2014  . Elevated alkaline phosphatase level 08/24/2014  . Lactose intolerance 08/24/2014  . Obesity 08/24/2014  . Peripheral edema 08/24/2014  . Essential hypertension 08/03/2014  . Abdominal adhesions 08/02/2014  . 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[place]) 08/02/2014  . Degenerative joint disease (DJD) of lumbar spine 08/02/2014  . Diverticulosis of both small and large intestine 08/02/2014  .  Hyperuricemia 08/02/2014  . Pure hypercholesterolemia 08/02/2014  . Trigger finger 08/02/2014  . Right shoulder pain 07/29/2014  . Benign neoplasm of colon 06/30/2010  . Right upper quadrant pain 06/30/2010    Home Medications:    Current Meds  Medication Sig  . Cholecalciferol (VITAMIN D3) 25 MCG (1000 UT) CAPS Take by mouth.  . colchicine 0.6 MG tablet 0.6 mg every 4 (four) hours as needed  . cyclobenzaprine (FLEXERIL) 5 MG tablet TAKE 1 TABLET (5 MG TOTAL) BY MOUTH ONCE DAILY AS NEEDED FOR MUSCLE SPASMS  . hydrochlorothiazide (HYDRODIURIL) 12.5 MG tablet Take 1 tablet (12.5 mg total) by mouth daily.  . insulin lispro (HUMALOG) 100 UNIT/ML injection INJECT 1 UNIT FOR EVERY 20g OVER 140g (max 50u total daily)  . ranitidine (ZANTAC) 300 MG tablet Take 1 tablet (300 mg total) by mouth 2 (two) times daily.  Marland Kitchen  rosuvastatin (CRESTOR) 10 MG tablet Take by mouth.  . TRESIBA FLEXTOUCH 100 UNIT/ML SOPN FlexTouch Pen INJECT 56 UNITS SUBQ ONCE DAILY    Allergies:   Amlodipine; Aspirin; Bee venom; Ciprofloxacin; Codeine; Erythromycin; Glimepiride; Hydralazine; Hydrochlorothiazide; Hydrocodone; Hydrocodone-acetaminophen; Hydromorphone; Irbesartan; Irbesartan-hydrochlorothiazide; Lisinopril; Maxitrol [neomycin-polymyxin-dexameth]; Metformin and related; Metformin hcl; Methylcellulose; Metoclopramide; Metoprolol; Neomycin-bacitracin zn-polymyx; Norvasc [amlodipine besylate]; Nsaids; Omeprazole magnesium; Other; Oxycodone-acetaminophen; Penicillins; Pioglitazone; Poison sumac extract; Prilosec [omeprazole]; Silver sulfadiazine; Sulfa antibiotics; Tape; Tetanus toxoid; Tramadol; and Betadine [povidone iodine]  Review of Systems (ROS): Review of Systems  Constitutional: Negative for chills and fever.  Respiratory: Negative for cough and shortness of breath.   Cardiovascular: Negative for chest pain and palpitations.  Gastrointestinal: Negative for abdominal pain, diarrhea, nausea and vomiting.   Genitourinary: Positive for dysuria. Negative for flank pain, frequency, hematuria and urgency.  Musculoskeletal: Positive for back pain.     Physical Exam:  Triage Vital Signs ED Triage Vitals  Enc Vitals Group     BP 10/18/18 1514 (!) 160/81     Pulse Rate 10/18/18 1514 90     Resp 10/18/18 1514 16     Temp 10/18/18 1514 98.4 F (36.9 C)     Temp Source 10/18/18 1514 Oral     SpO2 10/18/18 1514 99 %     Weight 10/18/18 1511 241 lb (109.3 kg)     Height 10/18/18 1511 5\' 9"  (1.753 m)     Head Circumference --      Peak Flow --      Pain Score 10/18/18 1510 0     Pain Loc --      Pain Edu? --      Excl. in [place]? --     Physical Exam  Constitutional: She is oriented to person, place, and time and well-developed, well-nourished, and in no distress.  HENT:  Head: Normocephalic and atraumatic.  Mouth/Throat: Mucous membranes are normal.  Cardiovascular: Normal rate, regular rhythm, normal heart sounds and intact distal pulses. Exam reveals no gallop and no friction rub.  No murmur heard. Pulmonary/Chest: Effort normal and breath sounds normal. No respiratory distress. She has no wheezes. She has no rales.  Abdominal: Soft. Bowel sounds are normal. There is no abdominal tenderness. There is CVA tenderness (RIGHT).  Neurological: She is alert and oriented to person, place, and time.  Skin: Skin is warm and dry. No rash noted. No erythema.  Psychiatric: Mood, affect and judgment normal.  Nursing note and vitals reviewed.    Urgent Care Treatments / Results:   LABS: PLEASE NOTE: all labs that were ordered this encounter are listed, however only abnormal results are displayed. Labs Reviewed  URINALYSIS, COMPLETE (UACMP) WITH MICROSCOPIC - Abnormal; Notable for the following components:      Result Value   APPearance CLOUDY (*)    Hgb urine dipstick LARGE (*)    Leukocytes,Ua SMALL (*)    Bacteria, UA RARE (*)    All other components within normal limits  URINE CULTURE     EKG: -NONE  RADIOLOGY: No results found.  PRODEDURES: Procedures  MEDICATIONS RECEIVED THIS VISIT: Medications - No data to display  PERTINENT CLINICAL COURSE NOTES/UPDATES: No data to display   Initial Impression / Assessment and Plan / Urgent Care Course:    [name] is a 73 y.o. female who presents to [facility] today with complaints of Dysuria  Pertinent labs & imaging results that were available during my care of the patient were personally reviewed by me and  considered in my medical decision making (see lab/imaging section of note for values and interpretations).  UA reviewed as positive for infection. PMH (+) for recurrent UTIs. Discussed follow up with PCP to review another prophylactic intervention. We are extremely limited on antimicrobial coverage in the patient due to extensive allergy list. Patient has successfully been treated with doxycycline in the past, which is appropriate in this situation. Will cover with 7 day course of doxycycline. Reflex culture sent. Patient aware that she may be contacted if urine demonstrates resistance. Discussed Azo products for pain (dysuria). Patient may use APAP on a PRN basis for back pain.   Discussed follow up with primary care physician this week for re-evaluation. I have reviewed the follow up and strict return precautions for any new or worsening symptoms. Patient is aware of symptoms that would be deemed urgent/emergent, and would thus require further evaluation either here or in the emergency department. At the time of discharge, she verbalized understanding and consent with the discharge plan as it was reviewed with her. All questions were fielded by provider and/or clinic staff prior to patient discharge.    Final Clinical Impressions(s) / Urgent Care Diagnoses:   Final diagnoses:  Acute cystitis without hematuria    New Prescriptions:   Meds ordered this encounter  Medications  . doxycycline (VIBRAMYCIN)  100 MG capsule    Sig: Take 1 capsule (100 mg total) by mouth 2 (two) times daily for 7 days.    Dispense:  14 capsule    Refill:  0    Controlled Substance Prescriptions:  [place] Controlled Substance Registry consulted? Not Applicable  NOTE: This note was prepared using Dragon dictation software along with smaller phrase technology. Despite my best ability to proofread, there is the potential that transcriptional errors may still occur from this process, and are completely unintentional.     Karen Kitchens, NP 10/18/18 1650

## 2018-10-19 ENCOUNTER — Other Ambulatory Visit: Payer: Self-pay | Admitting: Family Medicine

## 2018-10-19 DIAGNOSIS — E1140 Type 2 diabetes mellitus with diabetic neuropathy, unspecified: Secondary | ICD-10-CM

## 2018-10-20 MED ORDER — INSULIN LISPRO 100 UNIT/ML ~~LOC~~ SOLN: SUBCUTANEOUS | 1 refills | Status: DC

## 2018-10-20 NOTE — Addendum Note (Signed)
Addended by: Olin Hauser on: 10/20/2018 11:38 PM   Modules accepted: Orders

## 2018-10-21 LAB — URINE CULTURE: Culture: 10000 — AB

## 2018-10-24 ENCOUNTER — Other Ambulatory Visit: Payer: Self-pay | Admitting: Nurse Practitioner

## 2018-10-24 DIAGNOSIS — R1011 Right upper quadrant pain: Secondary | ICD-10-CM

## 2018-10-31 ENCOUNTER — Telehealth: Payer: Self-pay | Admitting: Family Medicine

## 2018-10-31 ENCOUNTER — Other Ambulatory Visit: Payer: Self-pay

## 2018-10-31 DIAGNOSIS — M109 Gout, unspecified: Secondary | ICD-10-CM

## 2018-10-31 MED ORDER — COLCRYS 0.6 MG PO TABS: ORAL_TABLET | ORAL | 2 refills | Status: DC

## 2018-10-31 NOTE — Telephone Encounter (Signed)
Covering inbox for [name], AGPCNP-BC while she is out of office.  History of pericarditis, temporal arteritis, and gout. She was followed by [facility]. Takes Colcrys 0.6mg  PRN only up to every 4 hours PRN gout flare and other inflammatory issues up to 3 days max, she has discussed it with her PCP recently but never rx here, [place], now needs new rx. I called her and she request refill, sent in Colcrys brand as instructed to [place]  She is also asking me about switching Zantac to Famotidine. I advised this is next option, she is allergic to PPI. She has complex history. She needs to discuss further with GI in future.  F/u with PCP in future  Nobie Putnam, [facility] 10/31/2018, 4:47 PM

## 2018-11-07 ENCOUNTER — Telehealth: Payer: Self-pay

## 2018-11-07 ENCOUNTER — Ambulatory Visit
Admission: EM | Admit: 2018-11-07 | Discharge: 2018-11-07 | Disposition: A | Discharge status: Home / Self Care | Payer: Medicare Other | Attending: Family Medicine | Admitting: Family Medicine

## 2018-11-07 ENCOUNTER — Encounter: Payer: Self-pay | Admitting: Emergency Medicine

## 2018-11-07 ENCOUNTER — Other Ambulatory Visit: Payer: Self-pay

## 2018-11-07 DIAGNOSIS — I10 Essential (primary) hypertension: Secondary | ICD-10-CM

## 2018-11-07 DIAGNOSIS — R5381 Other malaise: Secondary | ICD-10-CM | POA: Insufficient documentation

## 2018-11-07 DIAGNOSIS — R110 Nausea: Secondary | ICD-10-CM | POA: Diagnosis not present

## 2018-11-07 LAB — URINALYSIS, COMPLETE (UACMP) WITH MICROSCOPIC
Bacteria, UA: NONE SEEN
Bilirubin Urine: NEGATIVE
Glucose, UA: NEGATIVE mg/dL
Hgb urine dipstick: NEGATIVE
Ketones, ur: NEGATIVE mg/dL
Leukocytes,Ua: NEGATIVE
Nitrite: NEGATIVE
Protein, ur: NEGATIVE mg/dL
Specific Gravity, Urine: 1.025 (ref 1.005–1.030)
WBC, UA: NONE SEEN WBC/hpf (ref 0–5)
pH: 5.5 (ref 5.0–8.0)

## 2018-11-07 NOTE — ED Triage Notes (Signed)
Pt c/o nausea, incontinence and malaise. Started a couple of days ago.

## 2018-11-07 NOTE — ED Provider Notes (Signed)
[facility]    CSN: [id number] Arrival date & time: 11/07/18  1720     History   Chief Complaint Chief Complaint  Patient presents with  . Urinary Incontinence    HPI [name] is a 73 y.o. female.   73 yo female with a c/o nausea, malaise and fatigue for the past 2 days and one episode of urinary incontinence last night. Denies any fevers, chills, vomiting, diarrhea, abdominal pain. States she wanted to make sure it wasn't a UTI.      Past Medical History:  Diagnosis Date  . Anterolisthesis    Cervical spine  . Asthma   . Connective tissue disorder (HCC)    recurrent carotid arteritis, temporal arteritis, vasculitis mandible, general hepatitis, avascular necrosis bilat  . DDD (degenerative disc disease), lumbar   . Diabetes mellitus without complication ([place])   . Diverticulosis   . Diverticulosis   . Duodenitis   . Gouty arthritis   . Hiatal hernia with GERD   . Hyperlipidemia   . Hypertension   . Hyperuricemia   . Keratitis sicca, bilateral   . Lymphedema of both lower extremities   . Osteoarthritis   . Pericarditis    Recurrent: Aug 97, July 05, Sept 08, July 16, July 18  . PUD (peptic ulcer disease)   . 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 Sjogren's syndrome ([place])   . Sjogren's syndrome ([place])   . Syncope   . Tendonitis, Achilles, right   . Tortuous colon   . Trigger finger     Patient Active Problem List   Diagnosis Date Noted  . PAD (peripheral artery disease) ([place]) 10/12/2018  . Aortic atherosclerosis ([place]) 10/12/2018  . Carotid stenosis, asymptomatic, bilateral 10/12/2018  . Gastroesophageal reflux disease   . Acute gastritis without hemorrhage   . Positive colorectal cancer screening using Cologuard test   . Controlled type 2 diabetes with neuropathy ([place]) 04/29/2018  . Osteoarthritis 04/29/2018  . Hiatal hernia with GERD 04/29/2018  . Hyperlipidemia 04/29/2018  . Lymphedema of both lower extremities 04/29/2018  . Left knee pain  10/23/2016  . Cataracts, bilateral 07/31/2016  . Glaucoma 07/31/2016  . Lateral epicondylitis of left elbow 10/20/2015  . Vitamin D deficiency 10/20/2015  . Gouty arthritis 06/03/2015  . H/O syncope 06/03/2015  . Mild intermittent asthma 06/03/2015  . Multiple gastric ulcers 06/03/2015  . Schatzki's ring 06/03/2015  . Undifferentiated connective tissue disease ([place]) 06/03/2015  . Bone disorder 04/05/2015  . Dental injury 04/05/2015  . Sjogren's syndrome ([place]) 09/07/2014  . Elevated alkaline phosphatase level 08/24/2014  . Lactose intolerance 08/24/2014  . Obesity 08/24/2014  . Peripheral edema 08/24/2014  . Essential hypertension 08/03/2014  . Abdominal adhesions 08/02/2014  . 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[place]) 08/02/2014  . Degenerative joint disease (DJD) of lumbar spine 08/02/2014  . Diverticulosis of both small and large intestine 08/02/2014  . Hyperuricemia 08/02/2014  . Pure hypercholesterolemia 08/02/2014  . Trigger finger 08/02/2014  . Right shoulder pain 07/29/2014  . Benign neoplasm of colon 06/30/2010  . Right upper quadrant pain 06/30/2010    Past Surgical History:  Procedure Laterality Date  . ABDOMINAL HYSTERECTOMY    . BREAST BIOPSY    . CHOLECYSTECTOMY    . COLONOSCOPY WITH PROPOFOL N/A 08/26/2018   Procedure: COLONOSCOPY WITH PROPOFOL;  Surgeon: Lucilla Lame, MD;  Location: [facility] ENDOSCOPY;  Service: Endoscopy;  Laterality: N/A;  . CYSTOSCOPY  02/26/2018   [name], MD   . distal  arthrectomy    . ESOPHAGOGASTRODUODENOSCOPY (EGD) WITH PROPOFOL N/A 08/26/2018   Procedure: ESOPHAGOGASTRODUODENOSCOPY (EGD) WITH PROPOFOL;  Surgeon: Lucilla Lame, MD;  Location: [facility] ENDOSCOPY;  Service: Endoscopy;  Laterality: N/A;  . EXCISION NEUROMA    . KNEE SURGERY Bilateral    1985, 2001, 11/2002, 12/2006  . panhysterectomy  10/1983  . SHOULDER SURGERY Right    reverse total arthroplasty w/ biceps tenodesis  . TONSILLECTOMY AND ADENOIDECTOMY    . TOTAL HIP  ARTHROPLASTY Right 04/2007  . WISDOM TOOTH EXTRACTION      OB History   No obstetric history on file.      Home Medications    Prior to Admission medications   Medication Sig Start Date End Date Taking? Authorizing Provider  BD INSULIN SYRINGE U/F 31G X 5/16" 1 ML MISC USE AS DIRECTED. WITH HUMALOG 07/28/18  Yes Mikey College, NP  Cholecalciferol (VITAMIN D3) 25 MCG (1000 UT) CAPS Take by mouth.   Yes [provider]  COLCRYS 0.6 MG tablet Take as instructed every 4 hours as needed for acute gout flare up to 3 days 10/31/18  Yes Karamalegos, Devonne Doughty, DO  cyclobenzaprine (FLEXERIL) 5 MG tablet TAKE 1 TABLET BY MOUTH ONCE DAILY AS NEEDED FOR MUSCLE SPASM 10/24/18  Yes Karamalegos, Devonne Doughty, DO  famotidine (PEPCID) 20 MG tablet Take 20 mg by mouth 2 (two) times daily.   Yes [provider]  glucose blood (FREESTYLE LITE) test strip Use as directed three times daily for diabetes. 08/09/18  Yes Mikey College, NP  hydrochlorothiazide (HYDRODIURIL) 12.5 MG tablet Take 1 tablet (12.5 mg total) by mouth daily. 08/22/18  Yes Mikey College, NP  insulin lispro (HUMALOG) 100 UNIT/ML injection INJECT 1 UNIT FOR EVERY 20G OVER 140G (MAX 50U TOTAL DAILY) 10/20/18  Yes Karamalegos, Devonne Doughty, DO  Insulin Pen Needle (PEN NEEDLES) 32G X 5 MM MISC 1 Device by Does not apply route daily. 05/22/18  Yes Mikey College, NP  lactase (LACTAID) 3000 units tablet Take by mouth as needed.    Yes [provider]  Lancets (FREESTYLE) lancets Must test x4/day 06/19/16  Yes [provider]  rosuvastatin (CRESTOR) 10 MG tablet Take by mouth. 12/30/17 02/26/19 Yes [provider]  Na Sulfate-K Sulfate-Mg Sulf (SUPREP BOWEL PREP KIT) 17.5-3.13-1.6 GM/177ML SOLN Take 1 kit by mouth as directed. 08/25/18   Lucilla Lame, MD  nebivolol (BYSTOLIC) 10 MG tablet Take 2.5 tablets (25 mg total) by mouth daily. Patient not taking: Reported on 08/26/2018 07/03/18    Mikey College, NP  prednisoLONE acetate (PRED FORTE) 1 % ophthalmic suspension INSTILL ONE DROP  as needed 08/05/15   [provider]  ranitidine (ZANTAC) 300 MG tablet Take 1 tablet (300 mg total) by mouth 2 (two) times daily. 07/24/18   Mikey College, NP  TRESIBA FLEXTOUCH 100 UNIT/ML SOPN FlexTouch Pen INJECT 56 UNITS SUBQ ONCE DAILY 08/22/18   Mikey College, NP    Family History History reviewed. No pertinent family history.  Social History Social History   Tobacco Use  . Smoking status: Former Smoker    Last attempt to quit: 01/22/1996    Years since quitting: 22.8  . Smokeless tobacco: Never Used  Substance Use Topics  . Alcohol use: Yes    Comment: occasional  . Drug use: Never     Allergies   Amlodipine; Aspirin; Bee venom; Ciprofloxacin; Codeine; Erythromycin; Glimepiride; Hydralazine; Hydrochlorothiazide; Hydrocodone; Hydrocodone-acetaminophen; Hydromorphone; Irbesartan; Irbesartan-hydrochlorothiazide; Lisinopril; Maxitrol [neomycin-polymyxin-dexameth]; Metformin and  related; Metformin hcl; Methylcellulose; Metoclopramide; Metoprolol; Neomycin-bacitracin zn-polymyx; Norvasc [amlodipine besylate]; Nsaids; Omeprazole magnesium; Other; Oxycodone-acetaminophen; Penicillins; Pioglitazone; Poison sumac extract; Prilosec [omeprazole]; Silver sulfadiazine; Sulfa antibiotics; Tape; Tetanus toxoid; Tramadol; and Betadine [povidone iodine]   Review of Systems Review of Systems   Physical Exam Triage Vital Signs ED Triage Vitals  Enc Vitals Group     BP 11/07/18 1740 (!) 152/83     Pulse Rate 11/07/18 1740 84     Resp 11/07/18 1740 18     Temp 11/07/18 1740 98.2 F (36.8 C)     Temp Source 11/07/18 1740 Oral     SpO2 11/07/18 1740 100 %     Weight 11/07/18 1735 250 lb (113.4 kg)     Height 11/07/18 1735 _0  (1.753 m)     Head Circumference --      Peak Flow --      Pain Score 11/07/18 1735 0     Pain Loc --      Pain Edu? --      Excl.  in [place]? --    No data found.  Updated Vital Signs BP (!) 152/83 (BP Location: Left Arm)   Pulse 84   Temp 98.2 F (36.8 C) (Oral)   Resp 18   Ht _1  (1.753 m)   Wt 113.4 kg   SpO2 100%   BMI 36.92 kg/m   Visual Acuity Right Eye Distance:   Left Eye Distance:   Bilateral Distance:    Right Eye Near:   Left Eye Near:    Bilateral Near:     Physical Exam Constitutional:      General: She is not in acute distress.    Appearance: She is not toxic-appearing or diaphoretic.  Pulmonary:     Effort: Pulmonary effort is normal. No respiratory distress.  Abdominal:     General: There is no distension.     Palpations: Abdomen is soft.  Neurological:     Mental Status: She is alert.      UC Treatments / Results  Labs (all labs ordered are listed, but only abnormal results are displayed) Labs Reviewed  URINALYSIS, COMPLETE (UACMP) WITH MICROSCOPIC    EKG None  Radiology No results found.  Procedures Procedures (including critical care time)  Medications Ordered in UC Medications - No data to display  Initial Impression / Assessment and Plan / UC Course  I have reviewed the triage vital signs and the nursing notes.  Pertinent labs & imaging results that were available during my care of the patient were reviewed by me and considered in my medical decision making (see chart for details).      Final Clinical Impressions(s) / UC Diagnoses   Final diagnoses:  Nausea without vomiting  Malaise     Discharge Instructions     Clear fluids then advance diet slowly as tolerated Monitor for any other developing symptoms and follow up as needed    ED Prescriptions    None      1. Lab result (UA negative) and diagnosis reviewed with patient  2. Recommend supportive treatment as above  3. Follow-up prn if symptoms worsen or don't improve  Controlled Substance Prescriptions [place] Controlled Substance Registry consulted? Not Applicable   Norval Gable, MD  11/07/18 [phone]

## 2018-11-07 NOTE — Discharge Instructions (Addendum)
Clear fluids then advance diet slowly as tolerated Monitor for any other developing symptoms and follow up as needed

## 2018-11-07 NOTE — Telephone Encounter (Signed)
Called patient.  No answer LMOV.  Need to make patient aware that if she would like to come in for an in office appointment we can do that but we will need to change the time of her appointment to 4:00pm.   If patient would like to keep her video visit we can leave her appointment a the same time.

## 2018-11-09 NOTE — Progress Notes (Signed)
Cardiology Office Note  Date:  11/10/2018   ID:  [name], [name] 1946/05/03, MRN [id number]  PCP:  Mikey College, NP   Chief Complaint  Patient presents with  . New Patient (Initial Visit)    Per Cassell Smiles for Essential HTN. Meds reviewed verbally with patient.     HPI:  [name] is a 73 year old woman with past medical history of Former smoker  PAD/carotid HTN urinary incontinence, chronic UTIs Morbid obesity Moderate to large hiatal hernia. bleeding ulcers x 3, Aortic atherosclerosis Referred by Cassell Smiles for uncontrolled/resistant hypertension.  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, pericardial effusion Previously managed by [facility] being treated with colchicine in the past, Also previously treated with long course of steroids for unspecified connective tissue disorder  Previous echocardiograms from [facility] reviewed with her in detail Denies having any active issues  Blood pressure appears to be labile Brings home blood pressure measurements with her ranging from 409 systolic up to 811 On average blood pressure in the 120-140 range Reports that she feels terribly when blood pressure drops 2 other measurements of low blood pressure in the past week In general blood pressures are relatively labile She continues to take HCTZ 12.5 mg at lunchtime Has not been taking bystolic on a regular basis  CT scan 06/2018 Mild to moderate diffuse descending aortic atherosclerosis extending into the common iliac arteries Coronary calcification in the right coronary artery  Carotid u/s 05/2018 Less than 50% stenosis in the right and left internal carotid Arteries.    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  PSH:    Past Surgical History:  Procedure Laterality Date  . ABDOMINAL HYSTERECTOMY    . BREAST BIOPSY    . CHOLECYSTECTOMY    . COLONOSCOPY WITH PROPOFOL N/A 08/26/2018   Procedure: COLONOSCOPY WITH PROPOFOL;  Surgeon: Lucilla Lame, MD;  Location: [name] ENDOSCOPY;  Service: Endoscopy;  Laterality: N/A;  . CYSTOSCOPY  02/26/2018   [name], MD   . distal arthrectomy    . ESOPHAGOGASTRODUODENOSCOPY (EGD) WITH PROPOFOL N/A 08/26/2018   Procedure: ESOPHAGOGASTRODUODENOSCOPY (EGD) WITH PROPOFOL;  Surgeon: Lucilla Lame, MD;  Location[facility] ENDOSCOPY;  Service: Endoscopy;  Laterality: N/A;  . EXCISION NEUROMA    . KNEE SURGERY Bilateral    1985, 2001, 11/2002, 12/2006  . panhysterectomy  10/1983  . SHOULDER SURGERY Right    reverse total arthroplasty w/ biceps tenodesis  . TONSILLECTOMY AND ADENOIDECTOMY    . TOTAL HIP ARTHROPLASTY Right 04/2007  . WISDOM TOOTH EXTRACTION      Current Outpatient Medications  Medication Sig Dispense Refill  . BD INSULIN SYRINGE U/F 31G X 5/16" 1 ML MISC USE AS DIRECTED. WITH HUMALOG 100 each 5  . Cholecalciferol (VITAMIN D3) 25 MCG (1000 UT) CAPS Take by mouth.    . COLCRYS 0.6 MG tablet Take as instructed every 4 hours as needed for acute gout flare up to 3 days 30 tablet 2  . cyclobenzaprine (FLEXERIL) 5 MG tablet TAKE 1 TABLET BY MOUTH ONCE DAILY AS NEEDED FOR MUSCLE SPASM 30 tablet 2  . famotidine (PEPCID) 20 MG tablet Take  20 mg by mouth 2 (two) times daily.    Marland Kitchen glucose blood (FREESTYLE LITE) test strip Use as directed three times daily for diabetes. 100 each 4  . hydrochlorothiazide (HYDRODIURIL) 12.5 MG tablet Take 1 tablet (12.5  mg total) by mouth daily. 90 tablet 3  . insulin lispro (HUMALOG) 100 UNIT/ML injection INJECT 1 UNIT FOR EVERY 20G OVER 140G (MAX 50U TOTAL DAILY) 30 mL 1  . Insulin Pen Needle (PEN NEEDLES) 32G X 5 MM MISC 1 Device by Does not apply route daily. 100 each 3  . lactase (LACTAID) 3000 units tablet Take by mouth as needed.     . Lancets (FREESTYLE) lancets Must test x4/day    . Na Sulfate-K Sulfate-Mg Sulf (SUPREP BOWEL PREP KIT) 17.5-3.13-1.6 GM/177ML SOLN Take 1 kit by mouth as directed. 1 Bottle 0  . nebivolol (BYSTOLIC) 10 MG tablet Take 2.5 tablets (25 mg total) by mouth daily. 75 tablet 1  . prednisoLONE acetate (PRED FORTE) 1 % ophthalmic suspension INSTILL ONE DROP  as needed    . rosuvastatin (CRESTOR) 10 MG tablet Take by mouth.    . TRESIBA FLEXTOUCH 100 UNIT/ML SOPN FlexTouch Pen INJECT 56 UNITS SUBQ ONCE DAILY 18 mL 5   No current facility-administered medications for this visit.      Allergies:   Amlodipine; Aspirin; Bee venom; Ciprofloxacin; Codeine; Erythromycin; Glimepiride; Hydralazine; Hydrochlorothiazide; Hydrocodone; Hydrocodone-acetaminophen; Hydromorphone; Irbesartan; Irbesartan-hydrochlorothiazide; Lisinopril; Maxitrol [neomycin-polymyxin-dexameth]; Metformin and related; Metformin hcl; Methylcellulose; Metoclopramide; Metoprolol; Neomycin-bacitracin zn-polymyx; Norvasc [amlodipine besylate]; Nsaids; Omeprazole magnesium; Other; Oxycodone-acetaminophen; Penicillins; Pioglitazone; Poison sumac extract; Prilosec [omeprazole]; Silver sulfadiazine; Sulfa antibiotics; Tape; Tetanus toxoid; Tramadol; and Betadine [povidone iodine]   Social History:  The patient  reports that she quit smoking about 22 years ago. She has never used smokeless tobacco. She reports current alcohol use. She reports that she does not use drugs.   Family History:   family history is not on file.    Review of Systems: Review of Systems  Constitutional: Negative.   Respiratory: Negative.    Cardiovascular: Negative.   Gastrointestinal: Negative.   Musculoskeletal: Negative.   Neurological: Negative.   Psychiatric/Behavioral: Negative.   All other systems reviewed and are negative.    PHYSICAL EXAM: VS:  BP (!) 188/90 (BP Location: Left Arm, Patient Position: Sitting, Cuff Size: Normal)   Pulse 91   Ht 5' 9" (1.753 m)   Wt 246 lb 12 oz (111.9 kg)   BMI 36.44 kg/m  , BMI Body mass index is 36.44 kg/m.  Repeat blood pressure down to 774 systolic GEN: Well nourished, well developed, in no acute distress HEENT: normal Neck: no JVD, carotid bruits, or masses Cardiac: RRR; no murmurs, rubs, or gallops, mild pitting moderate swelling lower extremity edema Respiratory:  clear to auscultation bilaterally, normal work of breathing GI: soft, nontender, nondistended, + BS MS: no deformity or atrophy Skin: warm and dry, no rash Neuro:  Strength and sensation are intact Psych: euthymic mood, full affect    Recent Labs: 05/26/2018: Hemoglobin 15.2; Platelets 307; TSH 1.17 09/05/2018: ALT 13; BUN 17; Creat 1.00; Potassium 4.1; Sodium 140    Lipid Panel Lab Results  Component Value Date   CHOL 197 05/26/2018   HDL 48 (L) 05/26/2018   LDLCALC 116 (H) 05/26/2018   TRIG 209 (H) 05/26/2018      Wt Readings from Last 3 Encounters:  11/10/18 246 lb 12 oz (111.9 kg)  11/07/18 250 lb (113.4 kg)  10/18/18 241 lb (109.3 kg)  ASSESSMENT AND PLAN:  Aortic atherosclerosis (HCC) Goal LDL less than 70 given aortic atherosclerosis Recommended lifestyle modification, weight loss She is on Crestor 10 mg daily, recommended she take this daily May need to add Zetia 10 mg daily  PAD (peripheral artery disease) ([place]) As above with aortic atherosclerosis, carotid calcification Need aggressive lipid management  Essential hypertension Labile pressures Home numbers discussed with her ranging from low numbers 038 systolic up to 882C Likely component of whitecoat  syndrome Recommend she continue HCTZ 12.5 mg daily, could take Bystolic if numbers run consistently high  Pure hypercholesterolemia As above ideally needs LDL less than 70 Could consider adding Zetia  Lymphedema - Plan: Ambulatory referral to Vascular Surgery Recommended lymphedema compression pumps Moderately swollen on today's visit  Disposition:   F/U  6 months   Orders Placed This Encounter  Procedures  . Ambulatory referral to Vascular Surgery  . EKG 12-Lead     Signed, Esmond Plants, M.D., Ph.D. 11/10/2018  [place], [place]

## 2018-11-10 ENCOUNTER — Ambulatory Visit (INDEPENDENT_AMBULATORY_CARE_PROVIDER_SITE_OTHER): Payer: Medicare Other | Admitting: Cardiovascular Disease

## 2018-11-10 ENCOUNTER — Other Ambulatory Visit: Payer: Self-pay

## 2018-11-10 VITALS — BP 188/90 | HR 91 | Ht 69.0 in | Wt 246.8 lb

## 2018-11-10 DIAGNOSIS — E782 Mixed hyperlipidemia: Secondary | ICD-10-CM | POA: Diagnosis not present

## 2018-11-10 DIAGNOSIS — I739 Peripheral vascular disease, unspecified: Secondary | ICD-10-CM | POA: Diagnosis not present

## 2018-11-10 DIAGNOSIS — I10 Essential (primary) hypertension: Secondary | ICD-10-CM

## 2018-11-10 DIAGNOSIS — I700 Atherosclerosis of aorta: Secondary | ICD-10-CM

## 2018-11-10 DIAGNOSIS — E7800 Pure hypercholesterolemia, unspecified: Secondary | ICD-10-CM

## 2018-11-10 DIAGNOSIS — I6523 Occlusion and stenosis of bilateral carotid arteries: Secondary | ICD-10-CM | POA: Diagnosis not present

## 2018-11-10 DIAGNOSIS — I890 Lymphedema, not elsewhere classified: Secondary | ICD-10-CM | POA: Diagnosis not present

## 2018-11-10 NOTE — Patient Instructions (Addendum)
Medication Instructions:  No changes  Continue HCTZ 12.5 mg daily Ok to take bystrolic 5 mg as needed for high blood pressure  If you need a refill on your cardiac medications before your next appointment, please call your pharmacy.    Lab work: No new labs needed   If you have labs (blood work) drawn today and your tests are completely normal, you will receive your results only by: Marland Kitchen MyChart Message (if you have MyChart) OR . A paper copy in the mail If you have any lab test that is abnormal or we need to change your treatment, we will call you to review the results.   Testing/Procedures: No new testing needed   Follow-Up: At [facility], you and your health needs are our priority.  As part of our continuing mission to provide you with exceptional heart care, we have created designated Provider Care Teams.  These Care Teams include your primary Cardiologist (physician) and Advanced Practice Providers (APPs -  Physician Assistants and Nurse Practitioners) who all work together to provide you with the care you need, when you need it.  . You will need a follow up appointment as needed  . Providers on your designated Care Team:   . Murray Hodgkins, NP . Christell Faith, PA-C . Marrianne Mood, PA-C  Any Other Special Instructions Will Be Listed Below (If Applicable).  For educational health videos Log in to : [email or web address] Or : [email or web address], password : [email or web address]   How to Take Your Blood Pressure You can take your blood pressure at home with a machine. You may need to check your blood pressure at home:  To check if you have high blood pressure (hypertension).  To check your blood pressure over time.  To make sure your blood pressure medicine is working. Supplies needed: You will need a blood pressure machine, or monitor. You can buy one at a drugstore or online. When choosing one:  Choose one with an arm cuff.  Choose one that wraps around your upper arm. Only one  finger should fit between your arm and the cuff.  Do not choose one that measures your blood pressure from your wrist or finger. Your doctor can suggest a monitor. How to prepare Avoid these things for 30 minutes before checking your blood pressure:  Drinking caffeine.  Drinking alcohol.  Eating.  Smoking.  Exercising. Five minutes before checking your blood pressure:  Pee.  Sit in a dining chair. Avoid sitting in a soft couch or armchair.  Be quiet. Do not talk. How to take your blood pressure Follow the instructions that came with your machine. If you have a digital blood pressure monitor, these may be the instructions: 1. Sit up straight. 2. Place your feet on the floor. Do not cross your ankles or legs. 3. Rest your left arm at the level of your heart. You may rest it on a table, desk, or chair. 4. Pull up your shirt sleeve. 5. Wrap the blood pressure cuff around the upper part of your left arm. The cuff should be 1 inch (2.5 cm) above your elbow. It is best to wrap the cuff around bare skin. 6. Fit the cuff snugly around your arm. You should be able to place only one finger between the cuff and your arm. 7. Put the cord inside the groove of your elbow. 8. Press the power button. 9. Sit quietly while the cuff fills with air and loses air. 10. Write down the numbers on the screen.  11. Wait 2-3 minutes and then repeat steps 1-10. What do the numbers mean? Two numbers make up your blood pressure. The first number is called systolic pressure. The second is called diastolic pressure. An example of a blood pressure reading is "120 over 80" (or 120/80). If you are an adult and do not have a medical condition, use this guide to find out if your blood pressure is normal: Normal  First number: below 120.  Second number: below 80. Elevated  First number: 120-129.  Second number: below 80. Hypertension stage 1  First number: 130-139.  Second number: 80-89. Hypertension  stage 2  First number: 140 or above.  Second number: 13 or above. Your blood pressure is above normal even if only the top or bottom number is above normal. Follow these instructions at home:  Check your blood pressure as often as your doctor tells you to.  Take your monitor to your next doctor's appointment. Your doctor will: ? Make sure you are using it correctly. ? Make sure it is working right.  Make sure you understand what your blood pressure numbers should be.  Tell your doctor if your medicines are causing side effects. Contact a doctor if:  Your blood pressure keeps being high. Get help right away if:  Your first blood pressure number is higher than 180.  Your second blood pressure number is higher than 120. This information is not intended to replace advice given to you by your health care provider. Make sure you discuss any questions you have with your health care provider. Document Released: 05/10/2008 Document Revised: 04/25/2016 Document Reviewed: 11/04/2015 Elsevier Interactive Patient Education  2019 Elsevier Inc.  Blood Pressure Record Sheet To take your blood pressure, you will need a blood pressure machine. You can buy a blood pressure machine (blood pressure monitor) at your clinic, drug store, or online. When choosing one, consider:  An automatic monitor that has an arm cuff.  A cuff that wraps snugly around your upper arm. You should be able to fit only one finger between your arm and the cuff.  A device that stores blood pressure reading results.  Do not choose a monitor that measures your blood pressure from your wrist or finger. Follow your health care provider's instructions for how to take your blood pressure. To use this form:  Get one reading in the morning (a.m.) before you take any medicines.  Get one reading in the evening (p.m.) before supper.  Take at least 2 readings with each blood pressure check. This makes sure the results are  correct. Wait 1-2 minutes between measurements.  Write down the results in the spaces on this form.  Repeat this once a week, or as told by your health care provider.  Make a follow-up appointment with your health care provider to discuss the results. Blood pressure log Date: _______________________  a.m. _____________________(1st reading) _____________________(2nd reading)  p.m. _____________________(1st reading) _____________________(2nd reading) Date: _______________________  a.m. _____________________(1st reading) _____________________(2nd reading)  p.m. _____________________(1st reading) _____________________(2nd reading) Date: _______________________  a.m. _____________________(1st reading) _____________________(2nd reading)  p.m. _____________________(1st reading) _____________________(2nd reading) Date: _______________________  a.m. _____________________(1st reading) _____________________(2nd reading)  p.m. _____________________(1st reading) _____________________(2nd reading) Date: _______________________  a.m. _____________________(1st reading) _____________________(2nd reading)  p.m. _____________________(1st reading) _____________________(2nd reading) This information is not intended to replace advice given to you by your health care provider. Make sure you discuss any questions you have with your health care provider. Document Released: 02/24/2003 Document Revised: 05/28/2017 Document Reviewed: 05/28/2017 Elsevier Interactive Patient  Education  2019 Elsevier Inc.  Lymphedema  Lymphedema is swelling that is caused by the abnormal collection of lymph in the tissues under the skin. Lymph is fluid from the tissues in your body that is removed through the lymphatic system. This system is part of your body's defense system (immune system) and includes lymph nodes and lymph vessels. The lymph vessels collect and carry the excess fluid, fats, proteins, and wastes from the  tissues of the body to the bloodstream. This system also works to clean and remove bacteria and waste products from the body. Lymphedema occurs when the lymphatic system is blocked. When the lymph vessels or lymph nodes are blocked or damaged, lymph does not drain properly. This causes an abnormal buildup of lymph, which leads to swelling in the affected area. This may include the trunk area, or an arm or leg. Lymphedema cannot be cured by medicines, but various methods can be used to help reduce the swelling. There are two types of lymphedema: primary lymphedema and secondary lymphedema. What are the causes? The cause of this condition depends on the type of lymphedema that you have.  Primary lymphedema is caused by the absence of lymph vessels or having abnormal lymph vessels at birth.  Secondary lymphedema occurs when lymph vessels are blocked or damaged. Secondary lymphedema is more common. Common causes of lymph vessel blockage include: ? Skin infection, such as cellulitis. ? Infection by parasites (filariasis). ? Injury. ? Radiation therapy. ? Cancer. ? Formation of scar tissue. ? Surgery. What are the signs or symptoms? Symptoms of this condition include:  Swelling of the arm or leg.  A heavy or tight feeling in the arm or leg.  Swelling of the feet, toes, or fingers. Shoes or rings may fit more tightly than before.  Redness of the skin over the affected area.  Limited movement of the affected limb.  Sensitivity to touch or discomfort in the affected limb. How is this diagnosed? This condition may be diagnosed based on:  Your symptoms and medical history.  A physical exam.  Bioimpedance spectroscopy. In this test, painless electrical currents are used to measure fluid levels in your body.  Imaging tests, such as: ? Lymphoscintigraphy. In this test, a low dose of a radioactive substance is injected to trace the flow of lymph through the lymph vessels. ? MRI. ? CT  scan. ? Duplex ultrasound. This test uses sound waves to produce images of the vessels and the blood flow on a screen. ? Lymphangiography. In this test, a contrast dye is injected into the lymph vessel to help show blockages. How is this treated? Treatment for this condition may depend on the cause of your lymphedema. Treatment may include:  Complete decongestive therapy (CDT). This is done by a certified lymphedema therapist to reduce fluid congestion. This therapy includes: ? Manual lymph drainage. This is a special massage technique that promotes lymph drainage out of a limb. ? Skin care. ? Compression wrapping of the affected area. ? Specific exercises. Certain exercises can help fluid move out of the affected limb.  Compression. Various methods may be used to apply pressure to the affected limb to reduce the swelling. They include: ? Wearing compression stockings or sleeves on the affected limb. ? Wrapping the affected limb with special bandages.  Surgery. This is usually done for severe cases only. For example, surgery may be done if you have trouble moving the limb or if the swelling does not get better with other treatments. If an  underlying condition is causing the lymphedema, treatment for that condition will be done. For example, antibiotic medicines may be used to treat an infection. Follow these instructions at home: Self-care  The affected area is more likely to become injured or infected. Take these steps to help prevent infection: ? Keep the affected area clean and dry. ? Use approved creams or lotions to keep the skin moisturized. ? Protect your skin from cuts:  Use gloves while cooking or gardening.  Do not walk barefoot.  If you shave the affected area, use an Copy.  Do not wear tight clothes, shoes, or jewelry.  Eat a healthy diet that includes a lot of fruits and vegetables. Activity  Exercise regularly as directed by your health care  provider.  Do not sit with your legs crossed.  When possible, keep the affected limb raised (elevated) above the level of your heart.  Avoid carrying things with an arm that is affected by lymphedema. General instructions  Wear compression stockings or sleeves as told by your health care provider.  Note any changes in size of the affected limb. You may be instructed to take regular measurements and keep track of them.  Take over-the-counter and prescription medicines only as told by your health care provider.  If you were prescribed an antibiotic medicine, take or apply it as told by your health care provider. Do not stop using the antibiotic even if you start to feel better.  Do not use heating pads or ice packs over the affected area.  Avoid having blood draws, IV insertions, or blood pressure checked on the affected limb.  Keep all follow-up visits as told by your health care provider. This is important. Contact a health care provider if you:  Continue to have swelling in your limb.  Have a cut that does not heal.  Have redness or pain in the affected area. Get help right away if you:  Have new swelling in your limb that comes on suddenly.  Develop purplish spots, rash or sores (lesions) on your affected limb.  Have shortness of breath.  Have a fever or chills. Summary  Lymphedema is swelling that is caused by the abnormal collection of lymph in the tissues under the skin.  Lymph is fluid from the tissues in your body that is removed through the lymphatic system. This system collects and carries excess fluid, fats, proteins, and wastes from the tissues of the body to the bloodstream.  Lymphedema causes swelling, pain, and redness in the affected area. This may include the trunk area, or an arm or leg.  Treatment for this condition may depend on the cause of your lymphedema. Treatment may include complete decongestive therapy (CDT), compression methods, surgery, or  treating the underlying cause. This information is not intended to replace advice given to you by your health care provider. Make sure you discuss any questions you have with your health care provider. Document Released: 03/25/2007 Document Revised: 06/10/2017 Document Reviewed: 06/10/2017 Elsevier Interactive Patient Education  2019 [employer].

## 2018-11-11 ENCOUNTER — Ambulatory Visit: Payer: Medicare Other | Admitting: Cardiovascular Disease

## 2018-11-12 ENCOUNTER — Telehealth: Payer: Self-pay | Admitting: Cardiovascular Disease

## 2018-11-12 NOTE — Telephone Encounter (Signed)
Please call to discuss status of vascular referral.

## 2018-11-12 NOTE — Telephone Encounter (Signed)
Pt advised.

## 2018-11-12 NOTE — Telephone Encounter (Signed)
It appears the nurses have placed the referral in for vein and vascular.  That office should be getting in contact with the patient to schedule

## 2018-11-18 ENCOUNTER — Telehealth: Payer: Self-pay

## 2018-11-18 NOTE — Telephone Encounter (Signed)
The pt called to check on her mammogram order that was sent over in March.

## 2018-11-26 ENCOUNTER — Other Ambulatory Visit: Payer: Self-pay | Admitting: Nurse Practitioner

## 2018-11-26 DIAGNOSIS — N6489 Other specified disorders of breast: Secondary | ICD-10-CM

## 2018-11-26 DIAGNOSIS — R928 Other abnormal and inconclusive findings on diagnostic imaging of breast: Secondary | ICD-10-CM

## 2018-12-02 ENCOUNTER — Encounter (INDEPENDENT_AMBULATORY_CARE_PROVIDER_SITE_OTHER): Payer: Self-pay | Admitting: Vascular Surgery

## 2018-12-02 ENCOUNTER — Other Ambulatory Visit: Payer: Self-pay

## 2018-12-02 ENCOUNTER — Ambulatory Visit (INDEPENDENT_AMBULATORY_CARE_PROVIDER_SITE_OTHER): Payer: Medicare Other | Admitting: Vascular Surgery

## 2018-12-02 VITALS — BP 177/81 | HR 81 | Resp 14 | Ht 69.0 in | Wt 244.0 lb

## 2018-12-02 DIAGNOSIS — Z87891 Personal history of nicotine dependence: Secondary | ICD-10-CM

## 2018-12-02 DIAGNOSIS — I739 Peripheral vascular disease, unspecified: Secondary | ICD-10-CM

## 2018-12-02 DIAGNOSIS — I10 Essential (primary) hypertension: Secondary | ICD-10-CM

## 2018-12-02 DIAGNOSIS — E782 Mixed hyperlipidemia: Secondary | ICD-10-CM | POA: Diagnosis not present

## 2018-12-02 DIAGNOSIS — I890 Lymphedema, not elsewhere classified: Secondary | ICD-10-CM

## 2018-12-02 DIAGNOSIS — Z79899 Other long term (current) drug therapy: Secondary | ICD-10-CM

## 2018-12-02 NOTE — Patient Instructions (Signed)

## 2018-12-02 NOTE — Assessment & Plan Note (Signed)
lipid control important in reducing the progression of atherosclerotic disease. Continue statin therapy  

## 2018-12-02 NOTE — Assessment & Plan Note (Signed)
blood pressure control important in reducing the progression of atherosclerotic disease. On appropriate oral medications.  

## 2018-12-02 NOTE — Progress Notes (Signed)
Patient ID: [name], female   DOB: 1946/05/10, 73 y.o.   MRN: [id number]  Chief Complaint  Patient presents with  . New Patient (Initial Visit)    HPI [name] is a 73 y.o. female.  I am asked to see the patient by Dr. Rockey Situ for evaluation of lymphedema.  The patient reports first noticing leg swelling about 40 years ago.  She had a job where she was on her feet for long periods of time and gradually over many years this has worsened.  She has had 3 separate venous evaluations over the decades with the most recent being 3 years ago at [facility].  She has not had significant venous disease on any of her work-ups.  Her left leg is a little more swollen than the right leg.  She does not have ulceration.  She does have one episode of cellulitis several years ago.  Her cardiologist noticed how swollen her legs were and recommended she be seen for further evaluation.  She reports her legs are much better today than normal.  No recent fevers or chills.  No ulceration or infection.     Past Medical History:  Diagnosis Date  . Anterolisthesis    Cervical spine  . Asthma   . Connective tissue disorder (HCC)    recurrent carotid arteritis, temporal arteritis, vasculitis mandible, general hepatitis, avascular necrosis bilat  . DDD (degenerative disc disease), lumbar   . Diabetes mellitus without complication ([place])   . Diverticulosis   . Diverticulosis   . Duodenitis   . Gouty arthritis   . Hiatal hernia with GERD   . Hyperlipidemia   . Hypertension   . Hyperuricemia   . Keratitis sicca, bilateral   . Lymphedema of both lower extremities   . Osteoarthritis   . Pericarditis    Recurrent: Aug 97, July 05, Sept 08, July 16, July 18  . PUD (peptic ulcer disease)   . 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 Sjogren's syndrome ([place])   . Sjogren's syndrome ([place])   . Syncope   . Tendonitis, Achilles, right   . Tortuous colon   . Trigger finger     Past Surgical History:   Procedure Laterality Date  . ABDOMINAL HYSTERECTOMY    . BREAST BIOPSY    . CHOLECYSTECTOMY    . COLONOSCOPY WITH PROPOFOL N/A 08/26/2018   Procedure: COLONOSCOPY WITH PROPOFOL;  Surgeon: Lucilla Lame, MD;  Location: [facility] ENDOSCOPY;  Service: Endoscopy;  Laterality: N/A;  . CYSTOSCOPY  02/26/2018   [name], MD   . distal arthrectomy    . ESOPHAGOGASTRODUODENOSCOPY (EGD) WITH PROPOFOL N/A 08/26/2018   Procedure: ESOPHAGOGASTRODUODENOSCOPY (EGD) WITH PROPOFOL;  Surgeon: Lucilla Lame, MD;  Location: [facility] ENDOSCOPY;  Service: Endoscopy;  Laterality: N/A;  . EXCISION NEUROMA    . KNEE SURGERY Bilateral    1985, 2001, 11/2002, 12/2006  . panhysterectomy  10/1983  . SHOULDER SURGERY Right    reverse total arthroplasty w/ biceps tenodesis  . TONSILLECTOMY AND ADENOIDECTOMY    . TOTAL HIP ARTHROPLASTY Right 04/2007  . WISDOM TOOTH EXTRACTION      Family History No bleeding disorders, clotting disorders, aneurysms, or autoimmune disorders  Social History Social History   Tobacco Use  . Smoking status: Former Smoker    Quit date: 01/22/1996    Years since quitting: 22.8  . Smokeless tobacco: Never Used  Substance Use Topics  . Alcohol use: Yes    Comment: occasional  . Drug use:  Never    Allergies  Allergen Reactions  . Amlodipine     Other reaction(s): Unknown  . Aspirin     Other reaction(s): Unknown  . Bee Venom   . Ciprofloxacin     Other reaction(s): Other (see comments), Unknown  . Codeine     Other reaction(s): Unknown Must have pre medications before taking per patient report Includes all derivatives   . Erythromycin     Other reaction(s): Unknown, Unknown  . Glimepiride     Other reaction(s): Unknown  . Hydralazine     Other reaction(s): Unknown  . Hydrochlorothiazide     Other reaction(s): Dizziness or lightheadedness.  . Hydrocodone     Other reaction(s): Unknown Must have pre medications before taking per patient report  . Hydrocodone-Acetaminophen  Nausea And Vomiting    MUST BE PREMEDICATED  . Hydromorphone Nausea And Vomiting    Other reaction(s): Unknown Must have pre medications before taking per patient report MUST BE PREMEDICATED   . Irbesartan     Other reaction(s): Unknown  . Irbesartan-Hydrochlorothiazide     Other reaction(s): Unknown  . Lisinopril     Other reaction(s): Unknown  . Maxitrol [Neomycin-Polymyxin-Dexameth]   . Metformin And Related   . Metformin Hcl     Other reaction(s): Unknown  . Methylcellulose     Other reaction(s): Unknown  . Metoclopramide Nausea And Vomiting    Other reaction(s): Unknown  . Metoprolol     Other reaction(s): Unknown  . Neomycin-Bacitracin Zn-Polymyx     Other reaction(s): Unknown  . Norvasc [Amlodipine Besylate]   . Nsaids Other (See Comments)    Other reaction(s): Unknown bleeding   . Omeprazole Magnesium     Other reaction(s): Unknown  . Other     Pt is allergic to staples and surgical metals  . Oxycodone-Acetaminophen     Other reaction(s): Unknown, Unknown Must have pre medications before taking per patient report   . Penicillins     Other reaction(s): Unknown, Unknown  . Pioglitazone     Other reaction(s): Unknown  . Poison [employer]     Poison [place] and [place] also  . Prilosec [Omeprazole]   . Silver Sulfadiazine     Other reaction(s): Unknown  . Sulfa Antibiotics     Other reaction(s): Unknown, Unknown  . Tape     Other reaction(s): Unknown  . Tetanus Toxoid Other (See Comments)  . Tramadol     Other reaction(s): Unknown Must have pre medications before taking per patient report  . Betadine [Povidone Iodine] Rash    Only topically when left on skin.    Current Outpatient Medications  Medication Sig Dispense Refill  . Cholecalciferol (VITAMIN D3) 25 MCG (1000 UT) CAPS Take by mouth.    . COLCRYS 0.6 MG tablet Take as instructed every 4 hours as needed for acute gout flare up to 3 days 30 tablet 2  . cyclobenzaprine (FLEXERIL) 5 MG tablet TAKE 1  TABLET BY MOUTH ONCE DAILY AS NEEDED FOR MUSCLE SPASM 30 tablet 2  . famotidine (PEPCID) 20 MG tablet Take 20 mg by mouth 2 (two) times daily.    . hydrochlorothiazide (HYDRODIURIL) 12.5 MG tablet Take 1 tablet (12.5 mg total) by mouth daily. 90 tablet 3  . insulin lispro (HUMALOG) 100 UNIT/ML injection INJECT 1 UNIT FOR EVERY 20G OVER 140G (MAX 50U TOTAL DAILY) 30 mL 1  . lactase (LACTAID) 3000 units tablet Take by mouth as needed.     . nebivolol (BYSTOLIC) 10 MG  tablet Take 2.5 tablets (25 mg total) by mouth daily. (Patient taking differently: Take 5 mg by mouth daily. Patient only taking 52m.) 75 tablet 1  . prednisoLONE acetate (PRED FORTE) 1 % ophthalmic suspension INSTILL ONE DROP  as needed    . rosuvastatin (CRESTOR) 10 MG tablet Take by mouth.    . TRESIBA FLEXTOUCH 100 UNIT/ML SOPN FlexTouch Pen INJECT 56 UNITS SUBQ ONCE DAILY 18 mL 5  . BD INSULIN SYRINGE U/F 31G X 5/16" 1 ML MISC USE AS DIRECTED. WITH HUMALOG 100 each 5  . glucose blood (FREESTYLE LITE) test strip Use as directed three times daily for diabetes. 100 each 4  . Insulin Pen Needle (PEN NEEDLES) 32G X 5 MM MISC 1 Device by Does not apply route daily. 100 each 3  . Lancets (FREESTYLE) lancets Must test x4/day    . Na Sulfate-K Sulfate-Mg Sulf (SUPREP BOWEL PREP KIT) 17.5-3.13-1.6 GM/177ML SOLN Take 1 kit by mouth as directed. (Patient not taking: Reported on 12/02/2018) 1 Bottle 0   No current facility-administered medications for this visit.       REVIEW OF SYSTEMS (Negative unless checked)  Constitutional: '[]' Weight loss  '[]' Fever  '[]' Chills Cardiac: '[]' Chest pain   '[]' Chest pressure   '[]' Palpitations   '[]' Shortness of breath when laying flat   '[]' Shortness of breath at rest   '[]' Shortness of breath with exertion. Vascular:  '[]' Pain in legs with walking   '[]' Pain in legs at rest   '[]' Pain in legs when laying flat   '[]' Claudication   '[]' Pain in feet when walking  '[]' Pain in feet at rest  '[]' Pain in feet when laying flat   '[]' History of  DVT   '[]' Phlebitis   '[x]' Swelling in legs   '[]' Varicose veins   '[]' Non-healing ulcers Pulmonary:   '[]' Uses home oxygen   '[]' Productive cough   '[]' Hemoptysis   '[]' Wheeze  '[]' COPD   '[]' Asthma Neurologic:  '[]' Dizziness  '[]' Blackouts   '[]' Seizures   '[]' History of stroke   '[]' History of TIA  '[]' Aphasia   '[]' Temporary blindness   '[]' Dysphagia   '[]' Weakness or numbness in arms   '[]' Weakness or numbness in legs Musculoskeletal:  '[x]' Arthritis   '[]' Joint swelling   '[]' Joint pain   '[x]' Low back pain Hematologic:  '[]' Easy bruising  '[]' Easy bleeding   '[]' Hypercoagulable state   '[]' Anemic  '[]' Hepatitis Gastrointestinal:  '[]' Blood in stool   '[]' Vomiting blood  '[x]' Gastroesophageal reflux/heartburn   '[]' Abdominal pain Genitourinary:  '[]' Chronic kidney disease   '[]' Difficult urination  '[]' Frequent urination  '[]' Burning with urination   '[]' Hematuria Skin:  '[]' Rashes   '[]' Ulcers   '[]' Wounds Psychological:  '[]' History of anxiety   '[]'  History of major depression.    Physical Exam BP (!) 177/81 (BP Location: Left Wrist, Patient Position: Sitting, Cuff Size: Normal)   Pulse 81   Resp 14   Ht '5\' 9"'  (1.753 m)   Wt 244 lb (110.7 kg)   BMI 36.03 kg/m  Gen:  WD/WN, NAD. Appears younger than stated age. Head: [place]/AT, No temporalis wasting.  Ear/Nose/Throat: Hearing grossly intact, nares w/o erythema or drainage, oropharynx w/o Erythema/Exudate Eyes: Conjunctiva clear, sclera non-icteric  Neck: trachea midline.  No JVD.  Pulmonary:  Good air movement, respirations not labored, no use of accessory muscles  Cardiac: RRR, no JVD Vascular:  Vessel Right Left  Radial Palpable Palpable                          PT NP NP  DP 2+  2+  Gastrointestinal:. No masses, surgical incisions, or scars. Musculoskeletal: M/S 5/5 throughout.  Extremities without ischemic changes.  No deformity or atrophy. 1-2+ RLE edema, 2+ LLE edema. Neurologic: Sensation grossly intact in extremities.  Symmetrical.  Speech is fluent. Motor exam as listed above. Psychiatric:  Judgment intact, Mood & affect appropriate for pt's clinical situation. Dermatologic: No rashes or ulcers noted.  No cellulitis or open wounds.    Radiology No results found.  Labs Recent Results (from the past 2160 hour(s))  COMPLETE METABOLIC PANEL WITH GFR     Status: Abnormal   Collection Time: 09/05/18 11:21 AM  Result Value Ref Range   Glucose, Bld 131 (H) 65 - 99 mg/dL    Comment: .            Fasting reference interval . For someone without known diabetes, a glucose value >125 mg/dL indicates that they may have diabetes and this should be confirmed with a follow-up test. .    BUN 17 7 - 25 mg/dL   Creat 1.00 (H) 0.60 - 0.93 mg/dL    Comment: For patients >34 years of age, the reference limit for Creatinine is approximately 13% higher for people identified as African-American. .    GFR, Est Non African American 57 (L) > OR = 60 mL/min/1.31m   GFR, Est African American 66 > OR = 60 mL/min/1.778m  BUN/Creatinine Ratio 17 6 - 22 (calc)   Sodium 140 135 - 146 mmol/L   Potassium 4.1 3.5 - 5.3 mmol/L   Chloride 101 98 - 110 mmol/L   CO2 31 20 - 32 mmol/L   Calcium 9.5 8.6 - 10.4 mg/dL   Total Protein 6.7 6.1 - 8.1 g/dL   Albumin 3.9 3.6 - 5.1 g/dL   Globulin 2.8 1.9 - 3.7 g/dL (calc)   AG Ratio 1.4 1.0 - 2.5 (calc)   Total Bilirubin 0.4 0.2 - 1.2 mg/dL   Alkaline phosphatase (APISO) 86 37 - 153 U/L   AST 14 10 - 35 U/L   ALT 13 6 - 29 U/L  Urinalysis, Complete w Microscopic     Status: Abnormal   Collection Time: 10/18/18  3:19 PM  Result Value Ref Range   Color, Urine YELLOW YELLOW   APPearance CLOUDY (A) CLEAR   Specific Gravity, Urine 1.010 1.005 - 1.030   pH 5.5 5.0 - 8.0   Glucose, UA NEGATIVE NEGATIVE mg/dL   Hgb urine dipstick LARGE (A) NEGATIVE   Bilirubin Urine NEGATIVE NEGATIVE   Ketones, ur NEGATIVE NEGATIVE mg/dL   Protein, ur NEGATIVE NEGATIVE mg/dL   Nitrite NEGATIVE NEGATIVE   Leukocytes,Ua SMALL (A) NEGATIVE   Squamous Epithelial / LPF  0-5 0 - 5   WBC, UA 21-50 0 - 5 WBC/hpf   RBC / HPF 21-50 0 - 5 RBC/hpf   Bacteria, UA RARE (A) NONE SEEN    Comment: Performed at [facility] [place] [id number] culture     Status: Abnormal   Collection Time: 10/18/18  3:19 PM   Specimen: Urine, Clean Catch  Result Value Ref Range   Specimen Description      URINE, CLEAN CATCH [facility]formed at [facility], [address] [place] [id number]  Special Requests      NONE [facility]formed at [facility], [address] [id number]  Culture 10,000 COLONIES/mL ESCHERICHIA COLI (A)    Report Status 10/21/2018 FINAL  Organism ID, Bacteria ESCHERICHIA COLI (A)       Susceptibility   Escherichia coli - MIC*    AMPICILLIN <=2 SENSITIVE Sensitive     CEFAZOLIN <=4 SENSITIVE Sensitive     CEFTRIAXONE <=1 SENSITIVE Sensitive     CIPROFLOXACIN <=0.25 SENSITIVE Sensitive     GENTAMICIN <=1 SENSITIVE Sensitive     IMIPENEM <=0.25 SENSITIVE Sensitive     NITROFURANTOIN <=16 SENSITIVE Sensitive     TRIMETH/SULFA <=20 SENSITIVE Sensitive     AMPICILLIN/SULBACTAM <=2 SENSITIVE Sensitive     PIP/TAZO <=4 SENSITIVE Sensitive     Extended ESBL NEGATIVE Sensitive     * 10,000 COLONIES/mL ESCHERICHIA COLI  Urinalysis, Complete w Microscopic     Status: None   Collection Time: 11/07/18  5:33 PM  Result Value Ref Range   Color, Urine YELLOW YELLOW   APPearance CLEAR CLEAR   Specific Gravity, Urine 1.025 1.005 - 1.030   pH 5.5 5.0 - 8.0   Glucose, UA NEGATIVE NEGATIVE mg/dL   Hgb urine dipstick NEGATIVE NEGATIVE   Bilirubin Urine NEGATIVE NEGATIVE   Ketones, ur NEGATIVE NEGATIVE mg/dL   Protein, ur NEGATIVE NEGATIVE mg/dL   Nitrite NEGATIVE NEGATIVE   Leukocytes,Ua NEGATIVE NEGATIVE   Squamous Epithelial / LPF 0-5 0 - 5   WBC, UA NONE SEEN 0 - 5 WBC/hpf   RBC / HPF 0-5 0 - 5 RBC/hpf   Bacteria, UA NONE SEEN NONE SEEN    Comment: Performed at [facility]  [facility], [address] [place], [place] [address]    Assessment/Plan:  Benign essential HTN blood pressure control important in reducing the progression of atherosclerotic disease. On appropriate oral medications.   PAD (peripheral artery disease) (HCC) Pedal pulses present, do no suspect any significant arterial insufficiency  Hyperlipidemia lipid control important in reducing the progression of atherosclerotic disease. Continue statin therapy   Lymphedema The patient has a long history of lymphedema.  She cannot tolerate compression stockings due to the sensitivity on her feet and skin.  She does try to elevate her legs.  Not surprisingly, her symptoms have gradually progressed over many years.  She had a negative venous work-up 3 years ago at [facility] so I do not think a duplex at this time is of much benefit.  I do think she has severe stage II lymphedema and would benefit from a lymphedema pump.  We will try to get that approved going forward.  I have asked her to try to elevate her legs, continue activity, and if she can tolerate any sort of compression that would certainly be of benefit.  I will see her back in a few months to see how she is responded to the lymphedema pump.      Leotis Pain 12/02/2018, 1:53 PM   This note was created with Dragon medical transcription system.  Any errors from dictation are unintentional.

## 2018-12-02 NOTE — Assessment & Plan Note (Signed)
The patient has a long history of lymphedema.  She cannot tolerate compression stockings due to the sensitivity on her feet and skin.  She does try to elevate her legs.  Not surprisingly, her symptoms have gradually progressed over many years.  She had a negative venous work-up 3 years ago at [facility] so I do not think a duplex at this time is of much benefit.  I do think she has severe stage II lymphedema and would benefit from a lymphedema pump.  We will try to get that approved going forward.  I have asked her to try to elevate her legs, continue activity, and if she can tolerate any sort of compression that would certainly be of benefit.  I will see her back in a few months to see how she is responded to the lymphedema pump.

## 2018-12-02 NOTE — Assessment & Plan Note (Signed)
Pedal pulses present, do no suspect any significant arterial insufficiency

## 2018-12-08 ENCOUNTER — Telehealth: Payer: Self-pay | Admitting: Gastroenterology

## 2018-12-08 NOTE — Telephone Encounter (Signed)
Patient called & l/m stating she had colonoscopy & upper endoscopy by Dr Allen Norris 08-26-18. She has another Endo w/bx @ [facility] on 12/24/18. She stated what started all of this is she had a positive cologard. She would like to know if there is a blood test to make sure she does not have cancer.

## 2018-12-11 ENCOUNTER — Other Ambulatory Visit: Payer: Self-pay

## 2018-12-11 ENCOUNTER — Ambulatory Visit
Admission: RE | Admit: 2018-12-11 | Discharge: 2018-12-11 | Disposition: A | Discharge status: Home / Self Care | Payer: Medicare Other | Source: Ambulatory Visit | Attending: Nurse Practitioner | Admitting: Nurse Practitioner

## 2018-12-11 DIAGNOSIS — R928 Other abnormal and inconclusive findings on diagnostic imaging of breast: Secondary | ICD-10-CM

## 2018-12-11 DIAGNOSIS — N6489 Other specified disorders of breast: Secondary | ICD-10-CM | POA: Diagnosis not present

## 2018-12-11 DIAGNOSIS — R922 Inconclusive mammogram: Secondary | ICD-10-CM | POA: Diagnosis not present

## 2018-12-11 DIAGNOSIS — N6323 Unspecified lump in the left breast, lower outer quadrant: Secondary | ICD-10-CM | POA: Diagnosis not present

## 2018-12-22 ENCOUNTER — Other Ambulatory Visit: Payer: Self-pay

## 2018-12-22 ENCOUNTER — Ambulatory Visit
Admission: EM | Admit: 2018-12-22 | Discharge: 2018-12-22 | Disposition: A | Discharge status: Home / Self Care | Payer: Medicare Other | Attending: Family Medicine | Admitting: Family Medicine

## 2018-12-22 DIAGNOSIS — Z1159 Encounter for screening for other viral diseases: Secondary | ICD-10-CM | POA: Diagnosis not present

## 2018-12-22 DIAGNOSIS — R1011 Right upper quadrant pain: Secondary | ICD-10-CM | POA: Diagnosis not present

## 2018-12-22 DIAGNOSIS — Z01812 Encounter for preprocedural laboratory examination: Secondary | ICD-10-CM | POA: Diagnosis not present

## 2018-12-22 LAB — COMPREHENSIVE METABOLIC PANEL
ALT: 22 U/L (ref 0–44)
AST: 23 U/L (ref 15–41)
Albumin: 3.9 g/dL (ref 3.5–5.0)
Alkaline Phosphatase: 88 U/L (ref 38–126)
Anion gap: 9 (ref 5–15)
BUN: 15 mg/dL (ref 8–23)
CO2: 25 mmol/L (ref 22–32)
Calcium: 8.9 mg/dL (ref 8.9–10.3)
Chloride: 101 mmol/L (ref 98–111)
Creatinine, Ser: 0.88 mg/dL (ref 0.44–1.00)
GFR calc Af Amer: >60 mL/min (ref >60)
GFR calc non Af Amer: >60 mL/min (ref >60)
Glucose, Bld: 145 mg/dL — ABNORMAL HIGH (ref 70–99)
Potassium: 3.8 mmol/L (ref 3.5–5.1)
Sodium: 135 mmol/L (ref 135–145)
Total Bilirubin: 0.6 mg/dL (ref 0.3–1.2)
Total Protein: 7.1 g/dL (ref 6.5–8.1)

## 2018-12-22 LAB — CBC WITH DIFFERENTIAL/PLATELET
Abs Immature Granulocytes: 0.04 10*3/uL (ref 0.00–0.07)
Basophils Absolute: 0.1 10*3/uL (ref 0.0–0.1)
Basophils Relative: 0 %
Eosinophils Absolute: 0.4 10*3/uL (ref 0.0–0.5)
Eosinophils Relative: 3 %
HCT: 44.7 % (ref 36.0–46.0)
Hemoglobin: 15.1 g/dL — ABNORMAL HIGH (ref 12.0–15.0)
Immature Granulocytes: 0 %
Lymphocytes Relative: 32 %
Lymphs Abs: 3.6 10*3/uL (ref 0.7–4.0)
MCH: 28.5 pg (ref 26.0–34.0)
MCHC: 33.8 g/dL (ref 30.0–36.0)
MCV: 84.3 fL (ref 80.0–100.0)
Monocytes Absolute: 0.8 10*3/uL (ref 0.1–1.0)
Monocytes Relative: 8 %
Neutro Abs: 6.3 10*3/uL (ref 1.7–7.7)
Neutrophils Relative %: 57 %
Platelets: 296 10*3/uL (ref 150–400)
RBC: 5.3 MIL/uL — ABNORMAL HIGH (ref 3.87–5.11)
RDW: 13.2 % (ref 11.5–15.5)
WBC: 11.2 10*3/uL — ABNORMAL HIGH (ref 4.0–10.5)
nRBC: 0 % (ref 0.0–0.2)

## 2018-12-22 NOTE — ED Triage Notes (Signed)
Patient complains of right lower quadrant pain, swelling in her abdomen. Patient states that he has a mass in her proper posterior stomach and she is scheduled for advanced endoscopy on Wednesday. Patient states that the pain is a sticking pain but soreness has been constant x 3 months.

## 2018-12-22 NOTE — ED Provider Notes (Signed)
[facility]    CSN: [id number] Arrival date & time: 12/22/18  1451     History   Chief Complaint Chief Complaint  Patient presents with  . Abdominal Pain    HPI [name] is a 73 y.o. female.   73 yo female with a c/o right mid quadrant abdominal pain intermittently for the past 3 months. States she being seen by GI and has an appt later this week for endoscopy/colonoscopy. States came in today because she noticed her abdomen looked swollen today. Denies any fevers, chills, vomiting, diarrhea, constipation, melena, hematochezia, dysuria, pelvic pain.    Abdominal Pain   Past Medical History:  Diagnosis Date  . Anterolisthesis    Cervical spine  . Asthma   . Connective tissue disorder (HCC)    recurrent carotid arteritis, temporal arteritis, vasculitis mandible, general hepatitis, avascular necrosis bilat  . DDD (degenerative disc disease), lumbar   . Diabetes mellitus without complication ([place])   . Diverticulosis   . Diverticulosis   . Duodenitis   . Gouty arthritis   . Hiatal hernia with GERD   . Hyperlipidemia   . Hypertension   . Hyperuricemia   . Keratitis sicca, bilateral   . Lymphedema of both lower extremities   . Osteoarthritis   . Pericarditis    Recurrent: Aug 97, July 05, Sept 08, July 16, July 18  . PUD (peptic ulcer disease)   . 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 Sjogren's syndrome ([place])   . Sjogren's syndrome ([place])   . Syncope   . Tendonitis, Achilles, right   . Tortuous colon   . Trigger finger     Patient Active Problem List   Diagnosis Date Noted  . Lymphedema 11/10/2018  . PAD (peripheral artery disease) ([place]) 10/12/2018  . Aortic atherosclerosis ([place]) 10/12/2018  . Carotid stenosis, asymptomatic, bilateral 10/12/2018  . Gastroesophageal reflux disease   . Acute gastritis without hemorrhage   . Positive colorectal cancer screening using Cologuard test   . Controlled type 2 diabetes with neuropathy ([place]) 04/29/2018  .  Osteoarthritis 04/29/2018  . Hiatal hernia with GERD 04/29/2018  . Hyperlipidemia 04/29/2018  . Lymphedema of both lower extremities 04/29/2018  . Left knee pain 10/23/2016  . Cataracts, bilateral 07/31/2016  . Glaucoma 07/31/2016  . Lateral epicondylitis of left elbow 10/20/2015  . Vitamin D deficiency 10/20/2015  . Gouty arthritis 06/03/2015  . H/O syncope 06/03/2015  . Mild intermittent asthma 06/03/2015  . Multiple gastric ulcers 06/03/2015  . Schatzki's ring 06/03/2015  . Undifferentiated connective tissue disease ([place]) 06/03/2015  . Bone disorder 04/05/2015  . Dental injury 04/05/2015  . Sjogren's syndrome ([place]) 09/07/2014  . Elevated alkaline phosphatase level 08/24/2014  . Lactose intolerance 08/24/2014  . Obesity 08/24/2014  . Peripheral edema 08/24/2014  . Benign essential HTN 08/03/2014  . Abdominal adhesions 08/02/2014  . 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[place]) 08/02/2014  . Degenerative joint disease (DJD) of lumbar spine 08/02/2014  . Diverticulosis of both small and large intestine 08/02/2014  . Hyperuricemia 08/02/2014  . Pure hypercholesterolemia 08/02/2014  . Trigger finger 08/02/2014  . Right shoulder pain 07/29/2014  . Benign neoplasm of colon 06/30/2010  . Right upper quadrant pain 06/30/2010    Past Surgical History:  Procedure Laterality Date  . ABDOMINAL HYSTERECTOMY    . BREAST BIOPSY    . BREAST EXCISIONAL BIOPSY Left 1986  . CHOLECYSTECTOMY    . COLONOSCOPY WITH PROPOFOL N/A 08/26/2018   Procedure: COLONOSCOPY WITH PROPOFOL;  Surgeon: Lucilla Lame, MD;  Location: [facility] ENDOSCOPY;  Service: Endoscopy;  Laterality: N/A;  . CYSTOSCOPY  02/26/2018   [name], MD   . distal arthrectomy    . ESOPHAGOGASTRODUODENOSCOPY (EGD) WITH PROPOFOL N/A 08/26/2018   Procedure: ESOPHAGOGASTRODUODENOSCOPY (EGD) WITH PROPOFOL;  Surgeon: Lucilla Lame, MD;  Location: [facility] ENDOSCOPY;  Service: Endoscopy;  Laterality: N/A;  . EXCISION NEUROMA    . KNEE  SURGERY Bilateral    1985, 2001, 11/2002, 12/2006  . panhysterectomy  10/1983  . SHOULDER SURGERY Right    reverse total arthroplasty w/ biceps tenodesis  . TONSILLECTOMY AND ADENOIDECTOMY    . TOTAL HIP ARTHROPLASTY Right 04/2007  . WISDOM TOOTH EXTRACTION      OB History   No obstetric history on file.      Home Medications    Prior to Admission medications   Medication Sig Start Date End Date Taking? Authorizing Provider  BD INSULIN SYRINGE U/F 31G X 5/16" 1 ML MISC USE AS DIRECTED. WITH HUMALOG 07/28/18  Yes Mikey College, NP  Cholecalciferol (VITAMIN D3) 25 MCG (1000 UT) CAPS Take by mouth.   Yes [provider]  COLCRYS 0.6 MG tablet Take as instructed every 4 hours as needed for acute gout flare up to 3 days 10/31/18  Yes Karamalegos, Devonne Doughty, DO  cyclobenzaprine (FLEXERIL) 5 MG tablet TAKE 1 TABLET BY MOUTH ONCE DAILY AS NEEDED FOR MUSCLE SPASM 10/24/18  Yes Karamalegos, Devonne Doughty, DO  famotidine (PEPCID) 20 MG tablet Take 20 mg by mouth 2 (two) times daily.   Yes [provider]  glucose blood (FREESTYLE LITE) test strip Use as directed three times daily for diabetes. 08/09/18  Yes Mikey College, NP  insulin lispro (HUMALOG) 100 UNIT/ML injection INJECT 1 UNIT FOR EVERY 20G OVER 140G (MAX 50U TOTAL DAILY) 10/20/18  Yes Karamalegos, Devonne Doughty, DO  Insulin Pen Needle (PEN NEEDLES) 32G X 5 MM MISC 1 Device by Does not apply route daily. 05/22/18  Yes Mikey College, NP  lactase (LACTAID) 3000 units tablet Take by mouth as needed.    Yes [provider]  Lancets (FREESTYLE) lancets Must test x4/day 06/19/16  Yes [provider]  nebivolol (BYSTOLIC) 10 MG tablet Take 2.5 tablets (25 mg total) by mouth daily. Patient taking differently: Take 5 mg by mouth daily. Patient only taking 9m. 07/03/18  Yes KMikey College NP  prednisoLONE acetate (PRED FORTE) 1 % ophthalmic suspension INSTILL ONE DROP  as needed 08/05/15   Yes [provider]  rosuvastatin (CRESTOR) 10 MG tablet Take by mouth. 12/30/17 02/26/19 Yes [provider]  TRESIBA FLEXTOUCH 100 UNIT/ML SOPN FlexTouch Pen INJECT 56 UNITS SUBQ ONCE DAILY 08/22/18  Yes KMikey College NP  hydrochlorothiazide (HYDRODIURIL) 12.5 MG tablet Take 1 tablet (12.5 mg total) by mouth daily. 08/22/18   KMikey College NP  Na Sulfate-K Sulfate-Mg Sulf (SUPREP BOWEL PREP KIT) 17.5-3.13-1.6 GM/177ML SOLN Take 1 kit by mouth as directed. Patient not taking: Reported on 12/02/2018 08/25/18   WLucilla Lame MD    Family History Family History  Problem Relation Age of Onset  . Breast cancer Neg Hx     Social History Social History   Tobacco Use  . Smoking status: Former Smoker    Quit date: 01/22/1996    Years since quitting: 22.9  . Smokeless tobacco: Never Used  Substance Use Topics  . Alcohol use: Yes    Comment: occasional  . Drug use: Never  Allergies   Amlodipine, Aspirin, Bee venom, Ciprofloxacin, Codeine, Erythromycin, Glimepiride, Hydralazine, Hydrochlorothiazide, Hydrocodone, Hydrocodone-acetaminophen, Hydromorphone, Irbesartan, Irbesartan-hydrochlorothiazide, Lisinopril, Maxitrol [neomycin-polymyxin-dexameth], Metformin and related, Metformin hcl, Methylcellulose, Metoclopramide, Metoprolol, Neomycin-bacitracin zn-polymyx, Norvasc [amlodipine besylate], Nsaids, Omeprazole magnesium, Other, Oxycodone-acetaminophen, Penicillins, Pioglitazone, Poison sumac extract, Prilosec [omeprazole], Silver sulfadiazine, Sulfa antibiotics, Tape, Tetanus toxoid, Tramadol, and Betadine [povidone iodine]   Review of Systems Review of Systems  Gastrointestinal: Positive for abdominal pain.     Physical Exam Triage Vital Signs ED Triage Vitals  Enc Vitals Group     BP 12/22/18 1536 (!) 188/76     Pulse Rate 12/22/18 1536 74     Resp 12/22/18 1536 18     Temp 12/22/18 1536 98.3 F (36.8 C)     Temp Source 12/22/18 1536 Oral      SpO2 12/22/18 1536 100 %     Weight 12/22/18 1532 239 lb (108.4 kg)     Height 12/22/18 1532 _0  (1.753 m)     Head Circumference --      Peak Flow --      Pain Score 12/22/18 1531 7     Pain Loc --      Pain Edu? --      Excl. in [place]? --    No data found.  Updated Vital Signs BP (!) 188/76 (BP Location: Left Arm)   Pulse 74   Temp 98.3 F (36.8 C) (Oral)   Resp 18   Ht _1  (1.753 m)   Wt 108.4 kg   SpO2 100%   BMI 35.29 kg/m   Visual Acuity Right Eye Distance:   Left Eye Distance:   Bilateral Distance:    Right Eye Near:   Left Eye Near:    Bilateral Near:     Physical Exam Vitals signs and nursing note reviewed.  Constitutional:      General: She is not in acute distress.    Appearance: She is not toxic-appearing or diaphoretic.  Abdominal:     General: Bowel sounds are normal. There is no distension.     Palpations: Abdomen is soft. There is no mass.     Tenderness: There is abdominal tenderness (mild; right upper quadrant; no rebound or guarding). There is no right CVA tenderness, left CVA tenderness, guarding or rebound.     Hernia: No hernia is present.  Neurological:     Mental Status: She is alert.      UC Treatments / Results  Labs (all labs ordered are listed, but only abnormal results are displayed) Labs Reviewed  CBC WITH DIFFERENTIAL/PLATELET - Abnormal; Notable for the following components:      Result Value   WBC 11.2 (*)    RBC 5.30 (*)    Hemoglobin 15.1 (*)    All other components within normal limits  COMPREHENSIVE METABOLIC PANEL - Abnormal; Notable for the following components:   Glucose, Bld 145 (*)    All other components within normal limits    EKG   Radiology No results found.  Procedures Procedures (including critical care time)  Medications Ordered in UC Medications - No data to display  Initial Impression / Assessment and Plan / UC Course  I have reviewed the triage vital signs and the nursing notes.   Pertinent labs & imaging results that were available during my care of the patient were reviewed by me and considered in my medical decision making (see chart for details).      Final Clinical Impressions(s) / UC Diagnoses  Final diagnoses:  Right upper quadrant abdominal pain     Discharge Instructions     Follow up with gastroenterology as scheduled this week    ED Prescriptions    None      1. Labresults and diagnosis reviewed with patient 2. Recommend supportive treatment 3. Follow up with GI as scheduled this week  4. Follow-up prn if symptoms worsen or don't improve Controlled Substance Prescriptions [place] Controlled Substance Registry consulted? Not Applicable   Norval Gable, MD 12/22/18 [phone]

## 2018-12-22 NOTE — Discharge Instructions (Signed)
Follow up with gastroenterology as scheduled this week

## 2018-12-24 DIAGNOSIS — Z9049 Acquired absence of other specified parts of digestive tract: Secondary | ICD-10-CM | POA: Diagnosis not present

## 2018-12-24 DIAGNOSIS — K3189 Other diseases of stomach and duodenum: Secondary | ICD-10-CM | POA: Diagnosis not present

## 2018-12-24 DIAGNOSIS — Z79899 Other long term (current) drug therapy: Secondary | ICD-10-CM | POA: Diagnosis not present

## 2018-12-24 DIAGNOSIS — K449 Diaphragmatic hernia without obstruction or gangrene: Secondary | ICD-10-CM | POA: Diagnosis not present

## 2018-12-24 DIAGNOSIS — Z87891 Personal history of nicotine dependence: Secondary | ICD-10-CM | POA: Diagnosis not present

## 2018-12-29 LAB — HM MAMMOGRAPHY

## 2018-12-30 ENCOUNTER — Telehealth: Payer: Self-pay

## 2018-12-30 NOTE — Telephone Encounter (Signed)
The pt called complaining of Rt side abdominal pain w/ swelling x 8 days. She describes it as she is retaining fluid on her right side of her abdomen. She was seen at [facility] for the pain x 8 days ago.   She also states that she had her consult with Dr Jola Schmidt from [facility] clinic concerning that mass that Dr. Allen Norris found during her endoscopy. They informed her that it was her splenic artery pressing up against her stomach. No need for any f/u appt.

## 2018-12-30 NOTE — Telephone Encounter (Addendum)
Non-urgent abdominal pain as the character has not changed over the last several weeks.  Patient has had GI appointment since urgent care visit for procedure.  Recommended patient schedule appointment to address concern further.  Patient reported that Gastroenterologist procedure did not affect her pain level, character or severity.

## 2018-12-30 NOTE — Telephone Encounter (Signed)
Patient wants a call back.    She has her procedure at [facility] last week and went fine.

## 2018-12-31 NOTE — Telephone Encounter (Signed)
I spoke with the patient and scheduled her for Friday to address her acute abdominal pain.

## 2019-01-02 ENCOUNTER — Encounter: Payer: Self-pay | Admitting: Nurse Practitioner

## 2019-01-02 ENCOUNTER — Ambulatory Visit (INDEPENDENT_AMBULATORY_CARE_PROVIDER_SITE_OTHER): Payer: Medicare Other | Admitting: Nurse Practitioner

## 2019-01-02 ENCOUNTER — Other Ambulatory Visit: Payer: Self-pay | Admitting: Nurse Practitioner

## 2019-01-02 ENCOUNTER — Other Ambulatory Visit: Payer: Self-pay

## 2019-01-02 VITALS — BP 149/66 | HR 70 | Temp 98.5°F | Resp 16 | Ht 69.0 in | Wt 241.0 lb

## 2019-01-02 DIAGNOSIS — R1011 Right upper quadrant pain: Secondary | ICD-10-CM | POA: Diagnosis not present

## 2019-01-02 DIAGNOSIS — I700 Atherosclerosis of aorta: Secondary | ICD-10-CM

## 2019-01-02 DIAGNOSIS — E7800 Pure hypercholesterolemia, unspecified: Secondary | ICD-10-CM

## 2019-01-02 DIAGNOSIS — R8290 Unspecified abnormal findings in urine: Secondary | ICD-10-CM | POA: Diagnosis not present

## 2019-01-02 DIAGNOSIS — E1140 Type 2 diabetes mellitus with diabetic neuropathy, unspecified: Secondary | ICD-10-CM

## 2019-01-02 DIAGNOSIS — K219 Gastro-esophageal reflux disease without esophagitis: Secondary | ICD-10-CM

## 2019-01-02 DIAGNOSIS — I10 Essential (primary) hypertension: Secondary | ICD-10-CM

## 2019-01-02 LAB — POCT URINALYSIS DIPSTICK
Bilirubin, UA: NEGATIVE
Glucose, UA: NEGATIVE
Ketones, UA: NEGATIVE
Nitrite, UA: NEGATIVE
Protein, UA: NEGATIVE
Spec Grav, UA: 1.01 (ref 1.010–1.025)
Urobilinogen, UA: 0.2 E.U./dL
pH, UA: 5 (ref 5.0–8.0)

## 2019-01-02 NOTE — Patient Instructions (Addendum)
[name],   Thank you for coming in to clinic today.  1. Keep using your foot pumps.   2. Your pain is either related to lymph flow return or was likely an injection site reaction from your insulin.  3. Continue checking your blood sugars - Recheck labs 2-3 days prior to your next visit.  Please schedule a follow-up appointment with Cassell Smiles, AGNP. Return in about 1 month (around 02/02/2019) for diabetes.  If you have any other questions or concerns, please feel free to call the clinic or send a message through [place]. You may also schedule an earlier appointment if necessary.  You will receive a survey after today's visit either digitally by e-mail or paper by [employer]. Your experiences and feedback matter to [place].  Please respond so we know how we are doing as we provide care for you.   Cassell Smiles, DNP, AGNP-BC Adult Gerontology Nurse Practitioner [place]

## 2019-01-02 NOTE — Progress Notes (Signed)
Subjective:    Patient ID: [name], female    DOB: [birth year], 73 y.o.   MRN: [id number]  [name] is a 73 y.o. female presenting on 01/02/2019 for Abdominal Pain (Right side onset couple of weeks as per patient used pump for edema on legs which improve her abdomonal pain from past 2 days )   HPI Superficial RIGHT sided abdominal pain.   Patient reports she developed right sided abdominal pain and swelling approximately 10 days ago.  She noted that one of her Tresiba injections caused severe pain that felt very acidic/burning before her urgent care visit.   Pain felt very sharp.  Pain has improved after avoiding injections to Right side of abdomen and with time.    Patient notes the location of pain was in Right UQ (just right of and above umbilicus.  Resolving now.  Felt puffiness/jiggly, but never a nodule.    Recurrent UTI Also notes urinary cloudiness, concern she may have a UTI.  Previously was on trimethorprim at night 100 mg daily, but stopped due to vaginal itchiness.  Social History   Tobacco Use  . Smoking status: Former Smoker    Quit date: 01/22/1996    Years since quitting: 22.9  . Smokeless tobacco: Never Used  Substance Use Topics  . Alcohol use: Yes    Comment: occasional  . Drug use: Never    Review of Systems Per HPI unless specifically indicated above     Objective:    BP (!) 149/66   Pulse 70   Temp 98.5 F (36.9 C) (Oral)   Resp 16   Ht 5\' 9"  (1.753 m)   Wt 241 lb (109.3 kg)   BMI 35.59 kg/m   Wt Readings from Last 3 Encounters:  01/02/19 241 lb (109.3 kg)  12/22/18 239 lb (108.4 kg)  12/02/18 244 lb (110.7 kg)    Physical Exam Vitals signs reviewed.  Constitutional:      General: She is not in acute distress.    Appearance: She is well-developed.  HENT:     Head: Normocephalic and atraumatic.  Cardiovascular:     Rate and Rhythm: Normal rate and regular rhythm.     Pulses:          Radial pulses are 2+ on the  right side and 2+ on the left side.       Posterior tibial pulses are 1+ on the right side and 1+ on the left side.     Heart sounds: Normal heart sounds, S1 normal and S2 normal.  Pulmonary:     Effort: Pulmonary effort is normal. No respiratory distress.     Breath sounds: Normal breath sounds and air entry.  Abdominal:     General: Bowel sounds are normal. There is no distension.     Palpations: Abdomen is soft.     Tenderness: There is no abdominal tenderness.     Hernia: No hernia is present.    Musculoskeletal:     Right lower leg: No edema.     Left lower leg: No edema.  Skin:    General: Skin is warm and dry.     Capillary Refill: Capillary refill takes less than 2 seconds.  Neurological:     Mental Status: She is alert and oriented to person, place, and time.  Psychiatric:        Attention and Perception: Attention normal.        Mood and Affect: Affect normal. Mood is  anxious.        Behavior: Behavior normal. Behavior is cooperative.     Results for orders placed or performed in visit on 01/02/19  Urine Culture   Specimen: Urine  Result Value Ref Range   MICRO NUMBER: [id number]    SPECIMEN QUALITY: Adequate    Sample Source URINE    STATUS: FINAL    ISOLATE 1: Klebsiella pneumoniae (A)       Susceptibility   Klebsiella pneumoniae - URINE CULTURE, REFLEX    AMOX/CLAVULANIC <=2 Sensitive     AMPICILLIN >=32 Resistant     AMPICILLIN/SULBACTAM 8 Sensitive     CEFAZOLIN* <=4 Not Reportable      * For infections other than uncomplicated UTIcaused by E. coli, K. pneumoniae or P. mirabilis:Cefazolin is resistant if MIC > or = 8 mcg/mL.(Distinguishing susceptible versus intermediatefor isolates with MIC < or = 4 mcg/mL requiresadditional testing.)For uncomplicated UTI caused by E. coli,K. pneumoniae or P. mirabilis: Cefazolin issusceptible if MIC <32 mcg/mL and predictssusceptible to the oral agents cefaclor, cefdinir,cefpodoxime, cefprozil, cefuroxime, cephalexinand  loracarbef.    CEFEPIME <=1 Sensitive     CEFTRIAXONE <=1 Sensitive     CIPROFLOXACIN <=0.25 Sensitive     LEVOFLOXACIN <=0.12 Sensitive     ERTAPENEM <=0.5 Sensitive     GENTAMICIN <=1 Sensitive     IMIPENEM <=0.25 Sensitive     NITROFURANTOIN 64 Intermediate     PIP/TAZO <=4 Sensitive     TOBRAMYCIN <=1 Sensitive     TRIMETH/SULFA* <=20 Sensitive      * For infections other than uncomplicated UTIcaused by E. coli, K. pneumoniae or P. mirabilis:Cefazolin is resistant if MIC > or = 8 mcg/mL.(Distinguishing susceptible versus intermediatefor isolates with MIC < or = 4 mcg/mL requiresadditional testing.)For uncomplicated UTI caused by E. coli,K. pneumoniae or P. mirabilis: Cefazolin issusceptible if MIC <32 mcg/mL and predictssusceptible to the oral agents cefaclor, cefdinir,cefpodoxime, cefprozil, cefuroxime, cephalexinand loracarbef.Legend:S = Susceptible  I = IntermediateR = Resistant  NS = Not susceptible* = Not tested  NR = Not reported**NN = See antimicrobic comments  POCT urinalysis dipstick  Result Value Ref Range   Color, UA amber    Clarity, UA cloudy    Glucose, UA Negative Negative   Bilirubin, UA negative    Ketones, UA negative    Spec Grav, UA 1.010 1.010 - 1.025   Blood, UA moderate    pH, UA 5.0 5.0 - 8.0   Protein, UA Negative Negative   Urobilinogen, UA 0.2 0.2 or 1.0 E.U./dL   Nitrite, UA negative    Leukocytes, UA Moderate (2+) (A) Negative   Appearance cloudy    Odor none       Assessment & Plan:   Problem List Items Addressed This Visit    None    Visit Diagnoses    RUQ pain    -  Primary Resolving pain was likely cellulitis vs abscess of subcutaneous layer.  Possible lymphedema component as well since swelling is also improving after patient resumed use of foot pumps. Continue monitoring.  No treatment needed at this time.  Continue using foot pumps as prescribed for total of 2 hours per day.  Follow-up prn 2-4 weeks.   Relevant Orders   Urine Culture  (Completed)   Cloudy urine     Patient with known recurrent UTI history.  She is no longer taking prescribed suppressive therapy.  Requests urine testing today. - UA suspicious for UTI - Send urine culture.  Treat with antibiotics after  culture - Follow-up with urology as previously scheduled.   Relevant Orders   POCT urinalysis dipstick (Completed)   Urine Culture (Completed)      Follow up plan: Return in about 1 month (around 02/02/2019) for diabetes.  Cassell Smiles, DNP, AGPCNP-BC Adult Gerontology Primary Care Nurse Practitioner [facility] 01/02/2019, 2:38 PM

## 2019-01-04 LAB — URINE CULTURE
MICRO NUMBER:: 701637
SPECIMEN QUALITY:: ADEQUATE

## 2019-01-06 ENCOUNTER — Other Ambulatory Visit: Payer: Self-pay

## 2019-01-06 ENCOUNTER — Emergency Department
Admission: EM | Admit: 2019-01-06 | Discharge: 2019-01-06 | Disposition: A | Discharge status: Home / Self Care | Payer: Medicare Other | Attending: Emergency Medicine | Admitting: Emergency Medicine

## 2019-01-06 ENCOUNTER — Ambulatory Visit (INDEPENDENT_AMBULATORY_CARE_PROVIDER_SITE_OTHER)
Admission: EM | Admit: 2019-01-06 | Discharge: 2019-01-06 | Disposition: A | Discharge status: Home / Self Care | Payer: Medicare Other | Source: Home / Self Care

## 2019-01-06 ENCOUNTER — Encounter: Payer: Self-pay | Admitting: Emergency Medicine

## 2019-01-06 ENCOUNTER — Telehealth: Payer: Self-pay

## 2019-01-06 DIAGNOSIS — Z880 Allergy status to penicillin: Secondary | ICD-10-CM | POA: Diagnosis not present

## 2019-01-06 DIAGNOSIS — I891 Lymphangitis: Secondary | ICD-10-CM | POA: Diagnosis not present

## 2019-01-06 DIAGNOSIS — N390 Urinary tract infection, site not specified: Secondary | ICD-10-CM | POA: Diagnosis not present

## 2019-01-06 DIAGNOSIS — W5501XA Bitten by cat, initial encounter: Secondary | ICD-10-CM | POA: Diagnosis not present

## 2019-01-06 DIAGNOSIS — J4520 Mild intermittent asthma, uncomplicated: Secondary | ICD-10-CM | POA: Diagnosis not present

## 2019-01-06 DIAGNOSIS — Z87891 Personal history of nicotine dependence: Secondary | ICD-10-CM | POA: Insufficient documentation

## 2019-01-06 DIAGNOSIS — Y939 Activity, unspecified: Secondary | ICD-10-CM | POA: Insufficient documentation

## 2019-01-06 DIAGNOSIS — Z79899 Other long term (current) drug therapy: Secondary | ICD-10-CM | POA: Diagnosis not present

## 2019-01-06 DIAGNOSIS — Z889 Allergy status to unspecified drugs, medicaments and biological substances status: Secondary | ICD-10-CM

## 2019-01-06 DIAGNOSIS — Y999 Unspecified external cause status: Secondary | ICD-10-CM | POA: Diagnosis not present

## 2019-01-06 DIAGNOSIS — I10 Essential (primary) hypertension: Secondary | ICD-10-CM | POA: Diagnosis not present

## 2019-01-06 DIAGNOSIS — Z794 Long term (current) use of insulin: Secondary | ICD-10-CM | POA: Insufficient documentation

## 2019-01-06 DIAGNOSIS — E119 Type 2 diabetes mellitus without complications: Secondary | ICD-10-CM | POA: Diagnosis not present

## 2019-01-06 DIAGNOSIS — Y929 Unspecified place or not applicable: Secondary | ICD-10-CM | POA: Diagnosis not present

## 2019-01-06 DIAGNOSIS — S61451A Open bite of right hand, initial encounter: Secondary | ICD-10-CM | POA: Insufficient documentation

## 2019-01-06 MED ORDER — CLINDAMYCIN HCL 300 MG PO CAPS: 300.0000 mg | ORAL_CAPSULE | Freq: Three times a day (TID) | ORAL | 0 refills | Status: AC

## 2019-01-06 MED ORDER — DOXYCYCLINE HYCLATE 100 MG PO TABS: 100.0000 mg | ORAL_TABLET | Freq: Two times a day (BID) | ORAL | 0 refills | Status: DC

## 2019-01-06 MED ORDER — CLINDAMYCIN HCL 150 MG PO CAPS
300.0000 mg | ORAL_CAPSULE | Freq: Once | ORAL | Status: AC
  Administered 2019-01-06: 15:00:00 300 mg via ORAL
  Filled 2019-01-06: qty 2

## 2019-01-06 MED ORDER — DOXYCYCLINE HYCLATE 100 MG PO TABS
100.0000 mg | ORAL_TABLET | Freq: Once | ORAL | Status: AC
  Administered 2019-01-06: 100 mg via ORAL
  Filled 2019-01-06: qty 1

## 2019-01-06 MED ORDER — CEPHALEXIN 500 MG PO CAPS: 500.0000 mg | ORAL_CAPSULE | Freq: Two times a day (BID) | ORAL | 0 refills | Status: DC

## 2019-01-06 MED ORDER — CEPHALEXIN 500 MG PO CAPS
500.0000 mg | ORAL_CAPSULE | Freq: Once | ORAL | Status: AC
  Administered 2019-01-06: 500 mg via ORAL
  Filled 2019-01-06: qty 1

## 2019-01-06 NOTE — ED Triage Notes (Signed)
Sent for [place] for evaluation of right hand.  Patient was bitten by her cat yesterday and today redness noted to right hand and streaking seen up right forearm.

## 2019-01-06 NOTE — Telephone Encounter (Addendum)
She called back stating she do not have the Trimethoprim prescription. She was give macrodantin as a preventative medication, but she discontinued it because it caused her to itch.  She is requesting that you call in abx to her pharmacy to treat the UTI. The pt also states that she was bite by her cat and now she have a red stripe going up her arm. She wanted to know if we could treat her with IV abx. I informed her we treat cat bites, but we do not treat with IV abx. 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

## 2019-01-06 NOTE — ED Provider Notes (Signed)
[facility] Emergency Department Provider Note   ____________________________________________    I have reviewed the triage vital signs and the nursing notes.   HISTORY  Chief Complaint Animal Bite     HPI [name] is a 73 y.o. female who reports she was bitten on the right hand by her cat yesterday evening.  This morning when she woke up she had redness around the bite with some streaking up her arm.  Denies fevers or chills.  No nausea or vomiting.  Went to urgent care and they sent her here for evaluation.  Has not take anything for this.  Denies pain.  Past Medical History:  Diagnosis Date  . Anterolisthesis    Cervical spine  . Asthma   . Connective tissue disorder (HCC)    recurrent carotid arteritis, temporal arteritis, vasculitis mandible, general hepatitis, avascular necrosis bilat  . DDD (degenerative disc disease), lumbar   . Diabetes mellitus without complication ([place])   . Diverticulosis   . Diverticulosis   . Duodenitis   . Gouty arthritis   . Hiatal hernia with GERD   . Hyperlipidemia   . Hypertension   . Hyperuricemia   . Keratitis sicca, bilateral   . Lymphedema of both lower extremities   . Osteoarthritis   . Pericarditis    Recurrent: Aug 97, July 05, Sept 08, July 16, July 18  . PUD (peptic ulcer disease)   . 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 Sjogren's syndrome ([place])   . Sjogren's syndrome ([place])   . Syncope   . Tendonitis, Achilles, right   . Tortuous colon   . Trigger finger     Patient Active Problem List   Diagnosis Date Noted  . Lymphedema 11/10/2018  . PAD (peripheral artery disease) ([place]) 10/12/2018  . Aortic atherosclerosis ([place]) 10/12/2018  . Carotid stenosis, asymptomatic, bilateral 10/12/2018  . Gastroesophageal reflux disease   . Acute gastritis without hemorrhage   . Positive colorectal cancer screening using Cologuard test   . Controlled type 2 diabetes with neuropathy ([place]) 04/29/2018  .  Osteoarthritis 04/29/2018  . Hiatal hernia with GERD 04/29/2018  . Hyperlipidemia 04/29/2018  . Lymphedema of both lower extremities 04/29/2018  . Left knee pain 10/23/2016  . Cataracts, bilateral 07/31/2016  . Glaucoma 07/31/2016  . Lateral epicondylitis of left elbow 10/20/2015  . Vitamin D deficiency 10/20/2015  . Gouty arthritis 06/03/2015  . H/O syncope 06/03/2015  . Mild intermittent asthma 06/03/2015  . Multiple gastric ulcers 06/03/2015  . Schatzki's ring 06/03/2015  . Undifferentiated connective tissue disease ([place]) 06/03/2015  . Bone disorder 04/05/2015  . Dental injury 04/05/2015  . Sjogren's syndrome ([place]) 09/07/2014  . Elevated alkaline phosphatase level 08/24/2014  . Lactose intolerance 08/24/2014  . Obesity 08/24/2014  . Peripheral edema 08/24/2014  . Benign essential HTN 08/03/2014  . Abdominal adhesions 08/02/2014  . 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[place]) 08/02/2014  . Degenerative joint disease (DJD) of lumbar spine 08/02/2014  . Diverticulosis of both small and large intestine 08/02/2014  . Hyperuricemia 08/02/2014  . Pure hypercholesterolemia 08/02/2014  . Trigger finger 08/02/2014  . Right shoulder pain 07/29/2014  . Benign neoplasm of colon 06/30/2010  . Right upper quadrant pain 06/30/2010    Past Surgical History:  Procedure Laterality Date  . ABDOMINAL HYSTERECTOMY    . BREAST BIOPSY    . BREAST EXCISIONAL BIOPSY Left 1986  . CHOLECYSTECTOMY    . COLONOSCOPY WITH PROPOFOL N/A 08/26/2018   Procedure:  COLONOSCOPY WITH PROPOFOL;  Surgeon: Lucilla Lame, MD;  Location: [facility] ENDOSCOPY;  Service: Endoscopy;  Laterality: N/A;  . CYSTOSCOPY  02/26/2018   [name], MD   . distal arthrectomy    . ESOPHAGOGASTRODUODENOSCOPY (EGD) WITH PROPOFOL N/A 08/26/2018   Procedure: ESOPHAGOGASTRODUODENOSCOPY (EGD) WITH PROPOFOL;  Surgeon: Lucilla Lame, MD;  Location: [facility] ENDOSCOPY;  Service: Endoscopy;  Laterality: N/A;  . EXCISION NEUROMA    . KNEE  SURGERY Bilateral    1985, 2001, 11/2002, 12/2006  . panhysterectomy  10/1983  . SHOULDER SURGERY Right    reverse total arthroplasty w/ biceps tenodesis  . TONSILLECTOMY AND ADENOIDECTOMY    . TOTAL HIP ARTHROPLASTY Right 04/2007  . WISDOM TOOTH EXTRACTION      Prior to Admission medications   Medication Sig Start Date End Date Taking? Authorizing Provider  BD INSULIN SYRINGE U/F 31G X 5/16" 1 ML MISC USE AS DIRECTED. WITH HUMALOG 07/28/18   Mikey College, NP  cephALEXin (KEFLEX) 500 MG capsule Take 1 capsule (500 mg total) by mouth 2 (two) times daily. 01/06/19   Lavonia Drafts, MD  Cholecalciferol (VITAMIN D3) 25 MCG (1000 UT) CAPS Take by mouth.    [provider]  clindamycin (CLEOCIN) 300 MG capsule Take 1 capsule (300 mg total) by mouth 3 (three) times daily for 7 days. 01/06/19 01/13/19  Lavonia Drafts, MD  COLCRYS 0.6 MG tablet Take as instructed every 4 hours as needed for acute gout flare up to 3 days 10/31/18   Olin Hauser, DO  cyclobenzaprine (FLEXERIL) 5 MG tablet TAKE 1 TABLET BY MOUTH ONCE DAILY AS NEEDED FOR MUSCLE SPASM 10/24/18   Parks Ranger, Devonne Doughty, DO  doxycycline (VIBRA-TABS) 100 MG tablet Take 1 tablet (100 mg total) by mouth 2 (two) times daily. 01/06/19   Lavonia Drafts, MD  famotidine (PEPCID) 20 MG tablet Take 20 mg by mouth 2 (two) times daily.    [provider]  glucose blood (FREESTYLE LITE) test strip Use as directed three times daily for diabetes. 08/09/18   Mikey College, NP  hydrochlorothiazide (HYDRODIURIL) 12.5 MG tablet Take 1 tablet (12.5 mg total) by mouth daily. Patient not taking: Reported on 01/02/2019 08/22/18   Mikey College, NP  insulin lispro (HUMALOG) 100 UNIT/ML injection INJECT 1 UNIT FOR EVERY 20G OVER 140G (MAX 50U TOTAL DAILY) Patient taking differently: 15 unit at meal time plus INJECT 1 UNIT FOR EVERY 20G OVER 140G (MAX 50U TOTAL DAILY) 10/20/18   Karamalegos, Devonne Doughty, DO  Insulin Pen  Needle (PEN NEEDLES) 32G X 5 MM MISC 1 Device by Does not apply route daily. 05/22/18   Mikey College, NP  lactase (LACTAID) 3000 units tablet Take by mouth as needed.     [provider]  Lancets (FREESTYLE) lancets Must test x4/day 06/19/16   [provider]  Na Sulfate-K Sulfate-Mg Sulf (SUPREP BOWEL PREP KIT) 17.5-3.13-1.6 GM/177ML SOLN Take 1 kit by mouth as directed. Patient not taking: Reported on 01/02/2019 08/25/18   Lucilla Lame, MD  nebivolol (BYSTOLIC) 10 MG tablet Take 2.5 tablets (25 mg total) by mouth daily. Patient taking differently: Take 5 mg by mouth daily. Patient only taking 49m. 07/03/18   KMikey College NP  prednisoLONE acetate (PRED FORTE) 1 % ophthalmic suspension INSTILL ONE DROP  as needed 08/05/15   [provider]  rosuvastatin (CRESTOR) 10 MG tablet Take by mouth. 12/30/17 02/26/19  [provider]  TRESIBA FLEXTOUCH 100 UNIT/ML SOPN FlexTouch Pen INJECT 56 UNITS  SUBQ ONCE DAILY 08/22/18   Mikey College, NP     Allergies Amlodipine, Aspirin, Bee venom, Ciprofloxacin, Codeine, Erythromycin, Glimepiride, Hydralazine, Hydrochlorothiazide, Hydrocodone, Hydrocodone-acetaminophen, Hydromorphone, Irbesartan, Irbesartan-hydrochlorothiazide, Lisinopril, Maxitrol [neomycin-polymyxin-dexameth], Metformin and related, Metformin hcl, Methylcellulose, Metoclopramide, Metoprolol, Neomycin-bacitracin zn-polymyx, Norvasc [amlodipine besylate], Nsaids, Omeprazole magnesium, Other, Oxycodone-acetaminophen, Penicillins, Pioglitazone, Poison sumac extract, Prilosec [omeprazole], Silver sulfadiazine, Sulfa antibiotics, Tape, Tetanus toxoid, Tramadol, and Betadine [povidone iodine]  Family History  Problem Relation Age of Onset  . Breast cancer Neg Hx     Social History Social History   Tobacco Use  . Smoking status: Former Smoker    Quit date: 01/22/1996    Years since quitting: 22.9  . Smokeless tobacco: Never Used  Substance Use  Topics  . Alcohol use: Yes    Comment: occasional  . Drug use: Never    Review of Systems  Constitutional: No fever/chills       Musculoskeletal: As above Skin: As above. Neurological: Negative for headaches     ____________________________________________   PHYSICAL EXAM:  VITAL SIGNS: ED Triage Vitals  Enc Vitals Group     BP 01/06/19 1300 (!) 175/76     Pulse Rate 01/06/19 1300 77     Resp 01/06/19 1300 19     Temp 01/06/19 1300 99.1 F (37.3 C)     Temp Source 01/06/19 1300 Oral     SpO2 01/06/19 1300 99 %     Weight 01/06/19 1219 109.3 kg (240 lb 15.4 oz)     Height --      Head Circumference --      Peak Flow --      Pain Score 01/06/19 1218 0     Pain Loc --      Pain Edu? --      Excl. in [place]? --      Constitutional: Alert and oriented.   Cardiovascular: Normal rate, regular rhythm.  Respiratory: Normal respiratory effort.  No retractions.  Musculoskeletal: Right hand: Erythema surrounding bite mark dorsal aspect at the base of the thumb, no significant induration or fluctuance with some erythema extending in a streak up the forearm Neurologic:  Normal speech and language. No gross focal neurologic deficits are appreciated.   Skin:  Skin is warm, dry, as above   ____________________________________________   LABS (all labs ordered are listed, but only abnormal results are displayed)  Labs Reviewed - No data to display ____________________________________________  EKG   ____________________________________________  RADIOLOGY   ____________________________________________   PROCEDURES  Procedure(s) performed: No  Procedures   Critical Care performed: No ____________________________________________   INITIAL IMPRESSION / ASSESSMENT AND PLAN / ED COURSE  Pertinent labs & imaging results that were available during my care of the patient were reviewed by me and considered in my medical decision making (see chart for details).   Patient overall well-appearing and in no acute distress, afebrile, unremarkable vital signs.  Exam consistent with early cellulitis.  Patient is significantly allergic to multiple medications which courses to use doxycycline and clindamycin for her animal bite.  In addition she has a Klebsiella urinary tract infection proven by culture for which she requested antibiotics, will start her on Keflex for this.  I encouraged her to be sure to use probiotics, strict return precautions if no improvement or worsening of her infection   ____________________________________________   FINAL CLINICAL IMPRESSION(S) / ED DIAGNOSES  Final diagnoses:  Cat bite, initial encounter  Lower urinary tract infection      NEW MEDICATIONS STARTED DURING  THIS VISIT:  Discharge Medication List as of 01/06/2019  1:54 PM    START taking these medications   Details  cephALEXin (KEFLEX) 500 MG capsule Take 1 capsule (500 mg total) by mouth 2 (two) times daily., Starting Tue 01/06/2019, Normal    clindamycin (CLEOCIN) 300 MG capsule Take 1 capsule (300 mg total) by mouth 3 (three) times daily for 7 days., Starting Tue 01/06/2019, Until Tue 01/13/2019, Normal    doxycycline (VIBRA-TABS) 100 MG tablet Take 1 tablet (100 mg total) by mouth 2 (two) times daily., Starting Tue 01/06/2019, Normal         Note:  This document was prepared using Dragon voice recognition software and may include unintentional dictation errors.   Lavonia Drafts, MD 01/06/19 [id number]

## 2019-01-06 NOTE — ED Notes (Addendum)
Spoke to [employer] with [employer].

## 2019-01-06 NOTE — ED Triage Notes (Signed)
Pt reports getting bit by her cat yesterday on her right hand, redness and swelling in hand and up right arm.

## 2019-01-06 NOTE — Discharge Instructions (Addendum)
I am concerned about our ability to treat this effectively with outpatient antibiotics due to your allergies. You already have streaking from the wound. I feel that you would benefit from IV antibiotic therapy.  Please leave urgent care and go directly to [facility] ER for evaluation and further treatment.   Honor Loh, MSN, APRN, FNP-C, CEN Advanced Practice Provider [facility] 01/06/2019 11:22 AM

## 2019-01-06 NOTE — ED Notes (Signed)
1 set of Blood Culture, rainbow, and gray on ice was sent to lab.

## 2019-01-06 NOTE — ED Provider Notes (Signed)
[name], [place]   Name: [name] DOB: May 26, 1946 MRN: [id number] CSN: [id number] PCP: Patricia College, NP  Arrival date and time:  01/06/19 1047  Chief Complaint:  Animal Bite   NOTE: Prior to seeing the patient today, I have reviewed the triage nursing documentation and vital signs. Clinical staff has updated patient's PMH/PSHx, current medication list, and drug allergies/intolerances to ensure comprehensive history available to assist in medical decision making.   History:   HPI: [name] is a 73 y.o. female who presents today with complaints of redness and swelling in her RIGHT hand after being bitten by her cat yesterday. Animal lives indoors and is reported to be up to date on all required/recommended vaccinations. Patient notes that injury occurred when animal was startled and she tried to calm him by petting it. Wound was immediately washed and antibiotic ointment was applied by patient at home. Patient woke this morning with increased erythema with ascending streaking. She denies any systemic signs and symptoms of infection; no fevers, chills, nausea, or vomiting. Patient denies pain in her hand.   Patient presents today with concerns due to developing infection in her hand. Patient's anxiety is intensified by the fact that she has "hardware" in her RIGHT shoulder and a history of pericarditis. Patient states, "I didn't want to wait around and let this infection get in my blood and cause more problems".   Past Medical History:  Diagnosis Date  . Anterolisthesis    Cervical spine  . Asthma   . Connective tissue disorder (HCC)    recurrent carotid arteritis, temporal arteritis, vasculitis mandible, general hepatitis, avascular necrosis bilat  . DDD (degenerative disc disease), lumbar   . Diabetes mellitus without complication ([place])   . Diverticulosis   . Diverticulosis   . Duodenitis   . Gouty arthritis   . Hiatal hernia with GERD   .  Hyperlipidemia   . Hypertension   . Hyperuricemia   . Keratitis sicca, bilateral   . Lymphedema of both lower extremities   . Osteoarthritis   . Pericarditis    Recurrent: Aug 97, July 05, Sept 08, July 16, July 18  . PUD (peptic ulcer disease)   . 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 Sjogren's syndrome ([place])   . Sjogren's syndrome ([place])   . Syncope   . Tendonitis, Achilles, right   . Tortuous colon   . Trigger finger     Past Surgical History:  Procedure Laterality Date  . ABDOMINAL HYSTERECTOMY    . BREAST BIOPSY    . BREAST EXCISIONAL BIOPSY Left 1986  . CHOLECYSTECTOMY    . COLONOSCOPY WITH PROPOFOL N/A 08/26/2018   Procedure: COLONOSCOPY WITH PROPOFOL;  Surgeon: Lucilla Lame, MD;  Location: [facility] ENDOSCOPY;  Service: Endoscopy;  Laterality: N/A;  . CYSTOSCOPY  02/26/2018   [name], MD   . distal arthrectomy    . ESOPHAGOGASTRODUODENOSCOPY (EGD) WITH PROPOFOL N/A 08/26/2018   Procedure: ESOPHAGOGASTRODUODENOSCOPY (EGD) WITH PROPOFOL;  Surgeon: Lucilla Lame, MD;  Location: [facility] ENDOSCOPY;  Service: Endoscopy;  Laterality: N/A;  . EXCISION NEUROMA    . KNEE SURGERY Bilateral    1985, 2001, 11/2002, 12/2006  . panhysterectomy  10/1983  . SHOULDER SURGERY Right    reverse total arthroplasty w/ biceps tenodesis  . TONSILLECTOMY AND ADENOIDECTOMY    . TOTAL HIP ARTHROPLASTY Right 04/2007  . WISDOM TOOTH EXTRACTION      Family History  Problem Relation Age of Onset  . Breast cancer Neg  Hx     Social History   Tobacco Use  . Smoking status: Former Smoker    Quit date: 01/22/1996    Years since quitting: 22.9  . Smokeless tobacco: Never Used  Substance Use Topics  . Alcohol use: Yes    Comment: occasional  . Drug use: Never    Patient Active Problem List   Diagnosis Date Noted  . Lymphedema 11/10/2018  . PAD (peripheral artery disease) ([place]) 10/12/2018  . Aortic atherosclerosis ([place]) 10/12/2018  . Carotid stenosis, asymptomatic, bilateral 10/12/2018  .  Gastroesophageal reflux disease   . Acute gastritis without hemorrhage   . Positive colorectal cancer screening using Cologuard test   . Controlled type 2 diabetes with neuropathy ([place]) 04/29/2018  . Osteoarthritis 04/29/2018  . Hiatal hernia with GERD 04/29/2018  . Hyperlipidemia 04/29/2018  . Lymphedema of both lower extremities 04/29/2018  . Left knee pain 10/23/2016  . Cataracts, bilateral 07/31/2016  . Glaucoma 07/31/2016  . Lateral epicondylitis of left elbow 10/20/2015  . Vitamin D deficiency 10/20/2015  . Gouty arthritis 06/03/2015  . H/O syncope 06/03/2015  . Mild intermittent asthma 06/03/2015  . Multiple gastric ulcers 06/03/2015  . Schatzki's ring 06/03/2015  . Undifferentiated connective tissue disease ([place]) 06/03/2015  . Bone disorder 04/05/2015  . Dental injury 04/05/2015  . Sjogren's syndrome ([place]) 09/07/2014  . Elevated alkaline phosphatase level 08/24/2014  . Lactose intolerance 08/24/2014  . Obesity 08/24/2014  . Peripheral edema 08/24/2014  . Benign essential HTN 08/03/2014  . Abdominal adhesions 08/02/2014  . 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[place]) 08/02/2014  . Degenerative joint disease (DJD) of lumbar spine 08/02/2014  . Diverticulosis of both small and large intestine 08/02/2014  . Hyperuricemia 08/02/2014  . Pure hypercholesterolemia 08/02/2014  . Trigger finger 08/02/2014  . Right shoulder pain 07/29/2014  . Benign neoplasm of colon 06/30/2010  . Right upper quadrant pain 06/30/2010    Home Medications:    No outpatient medications have been marked as taking for the 01/06/19 encounter [facility] Encounter).    Allergies:   Amlodipine, Aspirin, Bee venom, Ciprofloxacin, Codeine, Erythromycin, Glimepiride, Hydralazine, Hydrochlorothiazide, Hydrocodone, Hydrocodone-acetaminophen, Hydromorphone, Irbesartan, Irbesartan-hydrochlorothiazide, Lisinopril, Maxitrol [neomycin-polymyxin-dexameth], Metformin and related, Metformin hcl, Methylcellulose,  Metoclopramide, Metoprolol, Neomycin-bacitracin zn-polymyx, Norvasc [amlodipine besylate], Nsaids, Omeprazole magnesium, Other, Oxycodone-acetaminophen, Penicillins, Pioglitazone, Poison sumac extract, Prilosec [omeprazole], Silver sulfadiazine, Sulfa antibiotics, Tape, Tetanus toxoid, Tramadol, and Betadine [povidone iodine]  Review of Systems (ROS): Review of Systems  Constitutional: Negative for chills and fever.  Respiratory: Negative for cough and shortness of breath.   Cardiovascular: Negative for chest pain and palpitations.  Gastrointestinal: Negative for diarrhea, nausea and vomiting.  Skin: Positive for color change and wound.  All other systems reviewed and are negative.    Vital Signs: Today's Vitals   01/06/19 1056  BP: (!) 146/67  Pulse: 74  Resp: 16  Temp: 98.7 F (37.1 C)  TempSrc: Oral  SpO2: 98%  Weight: 241 lb (109.3 kg)  Height: 5\' 9"  (1.753 m)  PainSc: 0-No pain    Physical Exam: Physical Exam  Constitutional: She is oriented to person, place, and time and well-developed, well-nourished, and in no distress.  HENT:  Head: Normocephalic and atraumatic.  Mouth/Throat: Mucous membranes are normal.  Eyes: Pupils are equal, round, and reactive to light. EOM are normal.  Cardiovascular: Normal rate, regular rhythm, normal heart sounds and intact distal pulses. Exam reveals no gallop and no friction rub.  No murmur heard. Pulmonary/Chest: Effort normal and breath sounds normal. No respiratory  distress. She has no wheezes. She has no rales.  Musculoskeletal:     Right hand: She exhibits tenderness (mild) and swelling (mild). She exhibits normal range of motion (Full AROM; able to make fist), normal two-point discrimination, normal capillary refill and no deformity. Normal sensation noted. Normal strength noted. She exhibits no thumb/finger opposition.     Comments: Puncture wounds x 2 RIGHT hand; see photo below. (+) peri-wound erythema. No fluctuance or induration.  (+) ascending lymphangitic spread that terminates in the mid-forearm area. No warmth.   Neurological: She is alert and oriented to person, place, and time. Gait normal. GCS score is 15.  Skin: Skin is warm and dry. No rash noted.  Psychiatric: Mood, memory, affect and judgment normal.  Nursing note and vitals reviewed.     Urgent Care Treatments / Results:   LABS: PLEASE NOTE: all labs that were ordered this encounter are listed, however only abnormal results are displayed. Labs Reviewed - No data to display  EKG: -None  RADIOLOGY: No results found.  PROCEDURES: Procedures  MEDICATIONS RECEIVED THIS VISIT: Medications - No data to display  PERTINENT CLINICAL COURSE NOTES/UPDATES:   Initial Impression / Assessment and Plan / Urgent Care Course:  Pertinent labs & imaging results that were available during my care of the patient were personally reviewed by me and considered in my medical decision making (see lab/imaging section of note for values and interpretations).  [name] is a 73 y.o. female who presents to [facility] today with complaints of Animal Bite   Patient is well appearing overall in clinic today. She does not appear to be in any acute distress. Presenting symptoms (see HPI) and exam as documented above. Concern for developing infection.  Review that the most commonly associated bacteria species are Pasturella multocida, Staphylococcus aureus, and various other anerobes. Discussed that location of puncture wounds places her at increased risk for  joint and tendon sheath involvement. It is reassuring that she has full AROM of her hand and that she complains of only mild tenderness associated with palpation.   Patient has an extensive allergy/intolerance list. Reviewed outpatient treatment options, which given her intolerance to first line therapy (Augmentin), would require treatment with multiple antimicrobial agents (clindmycin +metronidazole or  clindamycin + doxycycline), which given her age and multiple intolerances, I am not sure that she can tolerate without complications such as Clostridioides difficile caused by the antibiotics. Patient has no systemic signs of infection (no fevers/chills, fatigue, N/V/D), however the ascending lymphangitis is concerning, especially seeing whereas she was only bitten yesterday. Discussed trying oral therapy at home with a follow up wound check in 2 days vs. further evaluation and intravenous ABX in the emergency department. Patient is concerned about the rate of progression due to her orthopedic hardware in her shoulder, and her past history of pericarditis. Patient verbalizes that she would rather proceed to the ED for IV therapy to avoid any potential unforeseen complications.   Call placed to ED by clinic staff to advise of presenting complaints, assessment, and discharge plan of care to their facility for further evaluation and treatment. Patient will be presenting for care via POV.    Final Clinical Impressions / Urgent Care Diagnoses:   Final diagnoses:  Cat bite, initial encounter  Ascending lymphangitis  Multiple drug allergies    New Prescriptions:  [place] Controlled Substance Registry consulted? Not Applicable  No orders of the defined types were placed in this encounter.   Recommended Follow up Care:  Patient encouraged to follow up with the following provider within the specified time frame, or sooner as dictated by the severity of her symptoms. As always, she was instructed that for any urgent/emergent care needs, she should seek care either here or in the emergency department for more immediate evaluation.  Follow-up Information    Go to  [facility].   Specialty: Emergency Medicine Why: You need IV antibiotics. Contact information: [place] [id number] [address] [place] [phone]        NOTE: This  note was prepared using Dragon dictation software along with smaller phrase technology. Despite my best ability to proofread, there is the potential that transcriptional errors may still occur from this process, and are completely unintentional.    Karen Kitchens, NP 01/06/19 1731

## 2019-01-06 NOTE — Telephone Encounter (Signed)
I have deferred treatment for UTI until we see what type of antibiotics she receives for her skin wound.  Review of chart shows Keflex has been prescribed and will also cover her UTI.

## 2019-01-06 NOTE — ED Notes (Signed)
Bite reported to [facility].

## 2019-01-07 ENCOUNTER — Encounter: Payer: Self-pay | Admitting: Nurse Practitioner

## 2019-01-07 NOTE — Telephone Encounter (Signed)
The pt was notified. No questions or concerns. 

## 2019-01-28 ENCOUNTER — Telehealth: Payer: Self-pay

## 2019-01-28 NOTE — Telephone Encounter (Signed)
The pt called stating she just got off the phone with a [place] Representative concerning her abdominal burning that happens after she administer the medication. She was told that her Healthcare Provider would need to contact them to discuss the concerns with medication. The pt states that you are aware of her concerns and the issues she experiences after administering the [place].   [phone]  ,  Press Option #8

## 2019-01-28 NOTE — Telephone Encounter (Signed)
Stopped injecting on RIGHT side for over 1 month and continued to have burning on right side when resuming. - Soreness never resolved - Abx for cat bite didn't improve skin pain - Does burn some on left side - Remains puffy/swollen on left side of abdomen  Patient was instructed to use a larger window of injection for insulin.  Patient has limited herself for many years to a single 2 inch belt line around abdomen at level of umbilicus due to being told that was the best place to inject by her endocrinologist in past.  This is best for absorption, but if having injection site reaction issues patient will need to use alternative injection sites.  Discussed with patient, she agrees to try.  I will defer calling Tresiba until 01/29/2019 as this is nonurgent issue.

## 2019-01-29 NOTE — Telephone Encounter (Signed)
Adverse event reported per patient request.

## 2019-01-30 ENCOUNTER — Other Ambulatory Visit: Payer: Self-pay | Admitting: Nurse Practitioner

## 2019-01-30 DIAGNOSIS — R1011 Right upper quadrant pain: Secondary | ICD-10-CM

## 2019-01-30 MED ORDER — CYCLOBENZAPRINE HCL 5 MG PO TABS: ORAL_TABLET | ORAL | 2 refills | Status: DC

## 2019-01-30 NOTE — Telephone Encounter (Signed)
Pt called request refill on Flexeril called into [facility]

## 2019-02-04 ENCOUNTER — Telehealth: Payer: Self-pay | Admitting: Cardiovascular Disease

## 2019-02-04 NOTE — Telephone Encounter (Signed)
Might be best to talk with Cassell Smiles There is rheumatology at [facility], Dr. Jefm Bryant

## 2019-02-04 NOTE — Telephone Encounter (Signed)
Returned call to patient. 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 He suggested a rheumatology referral.  She would like to pursue that further and would like a name of rheumatologist.   Routed to Dr. Rockey Situ to further advise.

## 2019-02-04 NOTE — Telephone Encounter (Signed)
Patient would like a call to discuss if she needs to see a rheumatologist, and if so, would like a referral.

## 2019-02-05 ENCOUNTER — Telehealth (INDEPENDENT_AMBULATORY_CARE_PROVIDER_SITE_OTHER): Payer: Self-pay | Admitting: Vascular Surgery

## 2019-02-05 NOTE — Telephone Encounter (Signed)
Spoke with patient. She said she didn't want Lauren Kennedy's recommendation she wanted Dr Donivan Scull. I let her know he recommends Dr Jefm Bryant at [facility]. She verbalized understanding.

## 2019-02-05 NOTE — Telephone Encounter (Signed)
Left message for patient to return call. AS, CMA 

## 2019-02-05 NOTE — Telephone Encounter (Signed)
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  Typically that would be treated by either rheumatologist, immunologist and in some cases your primary care provider.  As far as which labs should be drawn, but that is truly up to whichever specialist will be treating you as sometimes different doctors have different preferences on what they want to be ordered for diagnostic and/or baseline purposes.  The referral would need to take place through your PCP.

## 2019-02-05 NOTE — Telephone Encounter (Signed)
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 Patient asking what immunodeficiency labs should be drawn for this?

## 2019-02-06 NOTE — Telephone Encounter (Signed)
Patient has been advised of the below and verbalized understanding. AS, CMA

## 2019-02-09 ENCOUNTER — Telehealth: Payer: Self-pay

## 2019-02-09 DIAGNOSIS — E1140 Type 2 diabetes mellitus with diabetic neuropathy, unspecified: Secondary | ICD-10-CM

## 2019-02-09 DIAGNOSIS — M359 Systemic involvement of connective tissue, unspecified: Secondary | ICD-10-CM

## 2019-02-09 DIAGNOSIS — I10 Essential (primary) hypertension: Secondary | ICD-10-CM

## 2019-02-09 DIAGNOSIS — M3500 Sicca syndrome, unspecified: Secondary | ICD-10-CM

## 2019-02-09 NOTE — Telephone Encounter (Signed)
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 She would also like to get a liver panel drawn. She said that she discuss this with you at her last visit. I ask the patient about her A1C and she said she is not worried about that , because she know that's going to be out of range with the changes she had to make with her diabetes medication.

## 2019-02-09 NOTE — Telephone Encounter (Signed)
Lab orders placed.  

## 2019-02-10 DIAGNOSIS — E7800 Pure hypercholesterolemia, unspecified: Secondary | ICD-10-CM | POA: Diagnosis not present

## 2019-02-10 DIAGNOSIS — E1140 Type 2 diabetes mellitus with diabetic neuropathy, unspecified: Secondary | ICD-10-CM | POA: Diagnosis not present

## 2019-02-10 DIAGNOSIS — I700 Atherosclerosis of aorta: Secondary | ICD-10-CM | POA: Diagnosis not present

## 2019-02-10 DIAGNOSIS — I10 Essential (primary) hypertension: Secondary | ICD-10-CM | POA: Diagnosis not present

## 2019-02-10 DIAGNOSIS — K219 Gastro-esophageal reflux disease without esophagitis: Secondary | ICD-10-CM | POA: Diagnosis not present

## 2019-02-11 ENCOUNTER — Other Ambulatory Visit: Payer: Self-pay | Admitting: Nurse Practitioner

## 2019-02-11 DIAGNOSIS — E1140 Type 2 diabetes mellitus with diabetic neuropathy, unspecified: Secondary | ICD-10-CM

## 2019-02-11 LAB — COMPLETE METABOLIC PANEL WITH GFR
AG Ratio: 1.4 (calc) (ref 1.0–2.5)
ALT: 15 U/L (ref 6–29)
AST: 16 U/L (ref 10–35)
Albumin: 3.7 g/dL (ref 3.6–5.1)
Alkaline phosphatase (APISO): 80 U/L (ref 37–153)
BUN/Creatinine Ratio: 13 (calc) (ref 6–22)
BUN: 13 mg/dL (ref 7–25)
CO2: 29 mmol/L (ref 20–32)
Calcium: 9.4 mg/dL (ref 8.6–10.4)
Chloride: 104 mmol/L (ref 98–110)
Creat: 0.99 mg/dL — ABNORMAL HIGH (ref 0.60–0.93)
GFR, Est African American: 66 mL/min/{1.73_m2} (ref >=60)
GFR, Est Non African American: 57 mL/min/{1.73_m2} — ABNORMAL LOW (ref >=60)
Globulin: 2.7 g/dL (calc) (ref 1.9–3.7)
Glucose, Bld: 90 mg/dL (ref 65–99)
Potassium: 5 mmol/L (ref 3.5–5.3)
Sodium: 141 mmol/L (ref 135–146)
Total Bilirubin: 0.5 mg/dL (ref 0.2–1.2)
Total Protein: 6.4 g/dL (ref 6.1–8.1)

## 2019-02-11 LAB — SEDIMENTATION RATE: Sed Rate: 67 mm/h — ABNORMAL HIGH (ref 0–30)

## 2019-02-11 LAB — CBC WITH DIFFERENTIAL/PLATELET
Absolute Monocytes: 635 cells/uL (ref 200–950)
Basophils Absolute: 83 cells/uL (ref 0–200)
Basophils Relative: 0.9 %
Eosinophils Absolute: 313 cells/uL (ref 15–500)
Eosinophils Relative: 3.4 %
HCT: 45.7 % — ABNORMAL HIGH (ref 35.0–45.0)
Hemoglobin: 14.8 g/dL (ref 11.7–15.5)
Lymphs Abs: 3183 cells/uL (ref 850–3900)
MCH: 27.8 pg (ref 27.0–33.0)
MCHC: 32.4 g/dL (ref 32.0–36.0)
MCV: 85.9 fL (ref 80.0–100.0)
MPV: 10.2 fL (ref 7.5–12.5)
Monocytes Relative: 6.9 %
Neutro Abs: 4986 cells/uL (ref 1500–7800)
Neutrophils Relative %: 54.2 %
Platelets: 319 10*3/uL (ref 140–400)
RBC: 5.32 10*6/uL — ABNORMAL HIGH (ref 3.80–5.10)
RDW: 13.3 % (ref 11.0–15.0)
Total Lymphocyte: 34.6 %
WBC: 9.2 10*3/uL (ref 3.8–10.8)

## 2019-02-11 LAB — HEMOGLOBIN A1C
Hgb A1c MFr Bld: 7.6 % of total Hgb — ABNORMAL HIGH (ref <5.7)
Mean Plasma Glucose: 171 (calc)
eAG (mmol/L): 9.5 (calc)

## 2019-02-11 LAB — LIPID PANEL
Cholesterol: 169 mg/dL (ref <200)
HDL: 46 mg/dL — ABNORMAL LOW (ref >=50)
LDL Cholesterol (Calc): 100 mg/dL (calc) — ABNORMAL HIGH
Non-HDL Cholesterol (Calc): 123 mg/dL (calc) (ref <130)
Total CHOL/HDL Ratio: 3.7 (calc) (ref <5.0)
Triglycerides: 129 mg/dL (ref <150)

## 2019-02-11 LAB — C-REACTIVE PROTEIN: CRP: 8.4 mg/L — ABNORMAL HIGH (ref <8.0)

## 2019-02-12 ENCOUNTER — Encounter: Payer: Self-pay | Admitting: Nurse Practitioner

## 2019-02-12 ENCOUNTER — Other Ambulatory Visit: Payer: Self-pay

## 2019-02-12 ENCOUNTER — Ambulatory Visit (INDEPENDENT_AMBULATORY_CARE_PROVIDER_SITE_OTHER): Payer: Medicare Other | Admitting: Nurse Practitioner

## 2019-02-12 VITALS — BP 157/65 | HR 78 | Temp 99.0°F | Resp 16 | Ht 69.0 in | Wt 238.0 lb

## 2019-02-12 DIAGNOSIS — I129 Hypertensive chronic kidney disease with stage 1 through stage 4 chronic kidney disease, or unspecified chronic kidney disease: Secondary | ICD-10-CM | POA: Diagnosis not present

## 2019-02-12 DIAGNOSIS — E1140 Type 2 diabetes mellitus with diabetic neuropathy, unspecified: Secondary | ICD-10-CM | POA: Diagnosis not present

## 2019-02-12 DIAGNOSIS — I10 Essential (primary) hypertension: Secondary | ICD-10-CM | POA: Diagnosis not present

## 2019-02-12 MED ORDER — INSULIN LISPRO 100 UNIT/ML ~~LOC~~ SOLN: SUBCUTANEOUS | 1 refills | Status: AC

## 2019-02-12 NOTE — Progress Notes (Signed)
Subjective:    Patient ID: [name], female    DOB: 08/20/1945, 73 y.o.   MRN: [id number]  [name] is a 73 y.o. female presenting on 02/12/2019 for Hypertension and Diabetes   HPI Diabetes - Patient is checking CBG at home, but not regularly. - Patient has not been taking any humalog since last visit.  Patient has had no truly low blood sugars since stopping humalog.  Patient also reports taking a "blood boosting supplement" over the counter.  [name] is working very well.  Taking 56 units daily. Continues to have burning with injections. - She is not currently symptomatic and denies polydipsia, polyphagia, polyuria, headaches, diaphoresis, shakiness, chills, pain, numbness or tingling in extremities and changes in vision.    Recent Labs    05/22/18 1408 08/22/18 1347 02/10/19 1053  HGBA1C 7.6* 7.5* 7.6*    Lymphedema Leg swelling is improving significantly with lymphedema pumps 50 mmhg.   Hypertension Labile.  Continues to see Dr. Rockey Situ for BP. Patient self-titrates her nebivolol, usually taking 5-15 mg prn elevated BP readings. - Pt denies headache, lightheadedness, dizziness, changes in vision, chest tightness/pressure, palpitations, leg swelling, sudden loss of speech or loss of consciousness.   Social History   Tobacco Use   Smoking status: Former Smoker    Quit date: 01/22/1996    Years since quitting: 23.0   Smokeless tobacco: Never Used  Substance Use Topics   Alcohol use: Yes    Comment: occasional   Drug use: Never    Review of Systems Per HPI unless specifically indicated above     Objective:    BP (!) 157/65    Pulse 78    Temp 99 F (37.2 C) (Oral)    Resp 16    Ht 5\' 9"  (1.753 m)    Wt 238 lb (108 kg)    BMI 35.15 kg/m   Wt Readings from Last 3 Encounters:  02/12/19 238 lb (108 kg)  01/06/19 241 lb (109.3 kg)  01/06/19 241 lb (109.3 kg)    Physical Exam Vitals signs reviewed.  Constitutional:      General: She is not  in acute distress.    Appearance: She is well-developed.  HENT:     Head: Normocephalic and atraumatic.  Cardiovascular:     Rate and Rhythm: Normal rate and regular rhythm.     Pulses:          Radial pulses are 2+ on the right side and 2+ on the left side.       Posterior tibial pulses are 1+ on the right side and 1+ on the left side.     Heart sounds: Normal heart sounds, S1 normal and S2 normal.  Pulmonary:     Effort: Pulmonary effort is normal. No respiratory distress.     Breath sounds: Normal breath sounds and air entry.  Abdominal:     General: Bowel sounds are normal. There is no distension.     Palpations: Abdomen is soft.     Tenderness: There is no abdominal tenderness.     Hernia: No hernia is present.  Musculoskeletal:     Right lower leg: No edema.     Left lower leg: No edema.  Skin:    General: Skin is warm and dry.     Capillary Refill: Capillary refill takes less than 2 seconds.  Neurological:     Mental Status: She is alert and oriented to person, place, and time.  Psychiatric:  Attention and Perception: Attention normal.        Mood and Affect: Mood and affect normal.        Speech: Speech is rapid and pressured and tangential.        Behavior: Behavior normal. Behavior is cooperative.    Results for orders placed or performed in visit on 01/02/19  COMPLETE METABOLIC PANEL WITH GFR  Result Value Ref Range   Glucose, Bld 90 65 - 99 mg/dL   BUN 13 7 - 25 mg/dL   Creat 0.99 (H) 0.60 - 0.93 mg/dL   GFR, Est Non African American 57 (L) > OR = 60 mL/min/1.54m2   GFR, Est African American 66 > OR = 60 mL/min/1.40m2   BUN/Creatinine Ratio 13 6 - 22 (calc)   Sodium 141 135 - 146 mmol/L   Potassium 5.0 3.5 - 5.3 mmol/L   Chloride 104 98 - 110 mmol/L   CO2 29 20 - 32 mmol/L   Calcium 9.4 8.6 - 10.4 mg/dL   Total Protein 6.4 6.1 - 8.1 g/dL   Albumin 3.7 3.6 - 5.1 g/dL   Globulin 2.7 1.9 - 3.7 g/dL (calc)   AG Ratio 1.4 1.0 - 2.5 (calc)   Total  Bilirubin 0.5 0.2 - 1.2 mg/dL   Alkaline phosphatase (APISO) 80 37 - 153 U/L   AST 16 10 - 35 U/L   ALT 15 6 - 29 U/L  CBC with Differential/Platelet  Result Value Ref Range   WBC 9.2 3.8 - 10.8 Thousand/uL   RBC 5.32 (H) 3.80 - 5.10 Million/uL   Hemoglobin 14.8 11.7 - 15.5 g/dL   HCT 45.7 (H) 35.0 - 45.0 %   MCV 85.9 80.0 - 100.0 fL   MCH 27.8 27.0 - 33.0 pg   MCHC 32.4 32.0 - 36.0 g/dL   RDW 13.3 11.0 - 15.0 %   Platelets 319 140 - 400 Thousand/uL   MPV 10.2 7.5 - 12.5 fL   Neutro Abs 4,986 1,500 - 7,800 cells/uL   Lymphs Abs 3,183 850 - 3,900 cells/uL   Absolute Monocytes 635 200 - 950 cells/uL   Eosinophils Absolute 313 15 - 500 cells/uL   Basophils Absolute 83 0 - 200 cells/uL   Neutrophils Relative % 54.2 %   Total Lymphocyte 34.6 %   Monocytes Relative 6.9 %   Eosinophils Relative 3.4 %   Basophils Relative 0.9 %  Lipid panel  Result Value Ref Range   Cholesterol 169 <200 mg/dL   HDL 46 (L) > OR = 50 mg/dL   Triglycerides 129 <150 mg/dL   LDL Cholesterol (Calc) 100 (H) mg/dL (calc)   Total CHOL/HDL Ratio 3.7 <5.0 (calc)   Non-HDL Cholesterol (Calc) 123 <130 mg/dL (calc)  Hemoglobin A1c  Result Value Ref Range   Hgb A1c MFr Bld 7.6 (H) <5.7 % of total Hgb   Mean Plasma Glucose 171 (calc)   eAG (mmol/L) 9.5 (calc)  Sedimentation rate  Result Value Ref Range   Sed Rate 67 (H) 0 - 30 mm/h  C-reactive protein  Result Value Ref Range   CRP 8.4 (H) <8.0 mg/L      Assessment & Plan:   Problem List Items Addressed This Visit      Endocrine   Controlled type 2 diabetes with neuropathy ([place]) Patient remains at goal < 8% for DM.  Improved control preferred to goal < 7.0% A1c stable from last visit despite reduction of humalog. - Complications - dyslipidemia, CKD stage 4, peripheral neuropathy and hyperglycemia.  Plan:  1. Change therapy:  - Continue Tresiba 56 units daily without changes.  Encouraged rotating injection sites more than she currently is.  Suggested  trying injections into thigh subcutaneous tissues. - RESUME humalog but only for correction scale.  START humalog 1 unit per 50 over 150 2. Encourage improved lifestyle: - low carb/low glycemic diet reinforced prior education - Increase physical activity to 30 minutes most days of the week.  Explained that increased physical activity increases body's use of sugar for energy. 3. Check fasting am CBG and bring log to next visit for review 4. Continue ASA and Statin 5. Advised to schedule DM ophtho exam, send record. 6. CCM referral made to assist with DM control, lifestyle improvements. 7. Follow-up 3 mos    Relevant Medications   insulin lispro (HUMALOG) 100 UNIT/ML injection   Other Relevant Orders   Referral to Chronic Care Management Services    Other Visit Diagnoses    Essential hypertension    -  Primary Not currently controlled.  Labile.  Not a new problem.  Patient seeing cardiology. Patient also states she has not had renal artery stenosis workup completed.  Would like to try to complete this. - [place] ordered - FOLLOW-UP with cardiology after exam prn.   Relevant Orders   US Renal Artery Stenosis   Hypertensive chronic kidney disease with stage 1 through stage 4 chronic kidney disease, or unspecified chronic kidney disease        Relevant Orders   US Renal Artery Stenosis      Meds ordered this encounter  Medications   insulin lispro (HUMALOG) 100 UNIT/ML injection    Sig: Correction scale: take 1 unit per 50 over 150 before meals up to three times daily.  Up to 20 units per day.    Dispense:  20 mL    Refill:  1    Order Specific Question:   Supervising Provider    Answer:   Olin Hauser [[id number]]    Follow up plan: Return in about 3 months (around 05/14/2019) for diabetes (A1C), hypertension.  Cassell Smiles, DNP, AGPCNP-BC Adult Gerontology Primary Care Nurse Practitioner [facility] 02/12/2019, 2:51 PM

## 2019-02-12 NOTE — Patient Instructions (Addendum)
[name],   Thank you for coming in to clinic today.  1. Continue largely off humalog. - CONTINUE only correction scale  With CHANGES --> administer 1 unit per 50 for CBG greater than 150. - Goal is to avoid lows  2. Continue Tresiba 56 units daily  3. No changes to BP medications.  Please schedule a follow-up appointment with Cassell Smiles, AGNP. Return in about 3 months (around 05/14/2019) for diabetes (A1C), hypertension.  If you have any other questions or concerns, please feel free to call the clinic or send a message through [place]. You may also schedule an earlier appointment if necessary.  You will receive a survey after today's visit either digitally by e-mail or paper by [employer]. Your experiences and feedback matter to [place].  Please respond so we know how we are doing as we provide care for you.   Cassell Smiles, DNP, AGNP-BC Adult Gerontology Nurse Practitioner [place]

## 2019-02-16 ENCOUNTER — Other Ambulatory Visit: Payer: Self-pay | Admitting: Nurse Practitioner

## 2019-02-16 DIAGNOSIS — R1011 Right upper quadrant pain: Secondary | ICD-10-CM

## 2019-02-18 ENCOUNTER — Telehealth: Payer: Self-pay

## 2019-02-20 ENCOUNTER — Ambulatory Visit: Payer: Medicare Other | Admitting: Pharmacist

## 2019-02-20 NOTE — Patient Instructions (Signed)
Thank you allowing the Chronic Care Management Team to be a part of your care! It was a pleasure speaking with you today!     CCM (Chronic Care Management) Team    Janci Minor RN, BSN Nurse Care Coordinator  [phone]   Harlow Asa PharmD  Clinical Pharmacist  [phone]   Eula Fried LCSW Clinical Social Worker [phone]  Visit Information  Goals Addressed            This Visit's Progress   . "I'm in the donut hole" (pt-stated)       Current Barriers:  . Financial Barriers: patient has [place] [employer] Preferred Part D insurance and reports copay for Tresiba, Humalog, Bystolic and Colcrys is cost prohibitive at this time o Currently in the coverage gap of her coverage  Pharmacist Clinical Goal(s):  Marland Kitchen Over the next 30 days, patient will work with PharmD and providers to relieve medication access concerns  Interventions: . Appointment to complete comprehensive medication review scheduled . Counsel patient on Medicare Part D plan and coverage . Counsel patient on medication assistance options o Based on reported income, patient does not meet requirements for extra help subsidy o Based on reported income, patient meets income requirements for: ? Patient Assistance Program for [place] through [employer] ? Patient Assistance Program for Humalog through [employer] ? Patient Assistance Program for Colcrys through [place] ? Patient Assistance Program for [employer] through [place] . Reviewed application process.  o Advise that program eligibility typically runs only through the end of the calendar year. o Review option of discussing a change of insulins within class with PCP to reduce the number of assistance applications for patient to complete - Patient states due her history of drug allergies and reactions, she does not wish to switch forms of either insulin . Will collaborate with office staff to request that above applications be printed and patient  notified to pick up. . Patient to provide proof of income and complete and sign application application. . Will collaborate with PCP for their portion of application. Once completed, will submit to respective patient assistance programs.  Patient Self Care Activities:  . Patient will provide necessary portions of application   Initial goal documentation        Ms. Mchaney was given information about Chronic Care Management services today including:  1. CCM service includes personalized support from designated clinical staff supervised by her physician, including individualized plan of care and coordination with other care providers 2. 24/7 contact phone numbers for assistance for urgent and routine care needs. 3. Service will only be billed when office clinical staff spend 20 minutes or more in a month to coordinate care. 4. Only one practitioner may furnish and bill the service in a calendar month. 5. The patient may stop CCM services at any time (effective at the end of the month) by phone call to the office staff. 6. The patient will be responsible for cost sharing (co-pay) of up to 20% of the service fee (after annual deductible is met).  Patient agreed to services and verbal consent obtained.   The patient verbalized understanding of instructions provided today and declined a print copy of patient instruction materials.   Telephone follow up appointment with care management team member scheduled for: 9/21 at 1 pm   Harlow Asa, PharmD, [place] [phone]

## 2019-02-20 NOTE — Chronic Care Management (AMB) (Signed)
Chronic Care Management   Note  02/20/2019 Name: [name] MRN: [id number] DOB: 1946/03/28   Subjective:   [name] is a 73 y.o. year old female who is a primary care patient of Mikey College, NP. The CM team was consulted for assistance with chronic disease management and care coordination.   I reached out to [name] by phone today.   [name] was given information about Chronic Care Management services today including:  1. CCM service includes personalized support from designated clinical staff supervised by her physician, including individualized plan of care and coordination with other care providers 2. 24/7 contact phone numbers for assistance for urgent and routine care needs. 3. Service will only be billed when office clinical staff spend 20 minutes or more in a month to coordinate care. 4. Only one practitioner may furnish and bill the service in a calendar month. 5. The patient may stop CCM services at any time (effective at the end of the month) by phone call to the office staff. 6. The patient will be responsible for cost sharing (co-pay) of up to 20% of the service fee (after annual deductible is met).  Patient agreed to services and verbal consent obtained.   Review of patient status, including review of consultants reports, laboratory and other test data, was performed as part of comprehensive evaluation and provision of chronic care management services.   SDOH (Social Determinants of Health) screening performed today: Financial Strain . See Care Plan for related entries.   Objective:   Allergies  Allergen Reactions  . Amlodipine     Other reaction(s): Unknown  . Aspirin     Other reaction(s): Unknown  . Bee Venom   . Ciprofloxacin     Other reaction(s): Other (see comments), Unknown  . Codeine     Other reaction(s): Unknown Must have pre medications before taking per patient report Includes all derivatives   .  Erythromycin     Other reaction(s): Unknown, Unknown  . Glimepiride     Other reaction(s): Unknown  . Hydralazine     Other reaction(s): Unknown  . Hydrochlorothiazide     Other reaction(s): Dizziness or lightheadedness.  . Hydrocodone     Other reaction(s): Unknown Must have pre medications before taking per patient report  . Hydrocodone-Acetaminophen Nausea And Vomiting    MUST BE PREMEDICATED  . Hydromorphone Nausea And Vomiting    Other reaction(s): Unknown Must have pre medications before taking per patient report MUST BE PREMEDICATED   . Irbesartan     Other reaction(s): Unknown  . Irbesartan-Hydrochlorothiazide     Other reaction(s): Unknown  . Lisinopril     Other reaction(s): Unknown  . Maxitrol [Neomycin-Polymyxin-Dexameth]   . Metformin And Related   . Metformin Hcl     Other reaction(s): Unknown  . Methylcellulose     Other reaction(s): Unknown  . Metoclopramide Nausea And Vomiting    Other reaction(s): Unknown  . Metoprolol     Other reaction(s): Unknown  . Neomycin-Bacitracin Zn-Polymyx     Other reaction(s): Unknown  . Norvasc [Amlodipine Besylate]   . Nsaids Other (See Comments)    Other reaction(s): Unknown bleeding   . Omeprazole Magnesium     Other reaction(s): Unknown  . Other     Pt is allergic to staples and surgical metals  . Oxycodone-Acetaminophen     Other reaction(s): Unknown, Unknown Must have pre medications before taking per patient report   . Penicillins  Other reaction(s): Unknown, Unknown  . Pioglitazone     Other reaction(s): Unknown  . Poison [employer]     Poison [place] and [place] also  . Prilosec [Omeprazole]   . Silver Sulfadiazine     Other reaction(s): Unknown  . Sulfa Antibiotics     Other reaction(s): Unknown, Unknown  . Tape     Other reaction(s): Unknown  . Tetanus Toxoid Other (See Comments)  . Tramadol     Other reaction(s): Unknown Must have pre medications before taking per patient report  . Betadine  [Povidone Iodine] Rash    Only topically when left on skin.   Outpatient Encounter Medications as of 02/20/2019  Medication Sig  . BD INSULIN SYRINGE U/F 31G X 5/16" 1 ML MISC USE AS DIRECTED. WITH HUMALOG  . Cholecalciferol (VITAMIN D3) 25 MCG (1000 UT) CAPS Take by mouth.  . COLCRYS 0.6 MG tablet Take as instructed every 4 hours as needed for acute gout flare up to 3 days  . cyclobenzaprine (FLEXERIL) 5 MG tablet TAKE 1 TABLET (5 MG TOTAL) BY MOUTH ONCE DAILY AS NEEDED FOR MUSCLE SPASMS  . famotidine (PEPCID) 20 MG tablet Take 20 mg by mouth 2 (two) times daily.  Marland Kitchen glucose blood (FREESTYLE LITE) test strip USE AS DIRECTED THREE TIMES DAILY FOR DIABETES  . hydrochlorothiazide (HYDRODIURIL) 12.5 MG tablet Take 1 tablet (12.5 mg total) by mouth daily. (Patient taking differently: Take 12.5 mg by mouth daily. PRN per cardiologist.)  . insulin lispro (HUMALOG) 100 UNIT/ML injection Correction scale: take 1 unit per 50 over 150 before meals up to three times daily.  Up to 20 units per day.  . Insulin Pen Needle (PEN NEEDLES) 32G X 5 MM MISC 1 Device by Does not apply route daily.  Marland Kitchen lactase (LACTAID) 3000 units tablet Take by mouth as needed.   . Lancets (FREESTYLE) lancets Must test x4/day  . nebivolol (BYSTOLIC) 10 MG tablet Take 2.5 tablets (25 mg total) by mouth daily. (Patient taking differently: Take 5 mg by mouth. Patient only taking 31m. Prn as cardiology)  . prednisoLONE acetate (PRED FORTE) 1 % ophthalmic suspension INSTILL ONE DROP  as needed  . rosuvastatin (CRESTOR) 10 MG tablet Take by mouth.  . TRESIBA FLEXTOUCH 100 UNIT/ML SOPN FlexTouch Pen INJECT 56 UNITS SUBQ ONCE DAILY   No facility-administered encounter medications on file as of 02/20/2019.      Assessment:   Goals Addressed            This Visit's Progress   . "I'm in the donut hole" (pt-stated)       Current Barriers:  . Financial Barriers: [place] and reports  copay for Tresiba, Humalog, Bystolic and Colcrys is cost prohibitive at this time o Currently in the coverage gap of her coverage  Pharmacist Clinical Goal(s):  .Marland KitchenOver the next 30 days, patient will work with PharmD and providers to relieve medication access concerns  Interventions: . Appointment to complete comprehensive medication review scheduled . Counsel patient on Medicare Part D plan and coverage . Counsel patient on medication assistance options o Based on reported income, patient does not meet requirements for extra help subsidy o Based on reported income, patient meets income requirements for: ? Patient [place] Program for [place]? Patient Assistance Program for Humalog through [employer]? Patient [place] Program for Colcrys [place]? Patient [place] Program for [place] [place]. Reviewed application process.  o Advise that program eligibility typically runs only through the end of the calendar year. o Review option of discussing a change of insulins within class with PCP to reduce the number of assistance applications for patient to complete - Patient states due her history of drug allergies and reactions, she does not wish to switch forms of either insulin . Will collaborate with office staff to request that above applications be printed and patient notified to pick up. . Patient to provide proof of income and complete and sign application application. . Will collaborate with PCP for their portion of application. Once completed, will submit to respective patient assistance programs.  Patient Self Care Activities:  . Patient will provide necessary portions of application   Initial goal documentation        Plan:  Telephone follow up appointment with care management team member scheduled for: 9/21 at 1 pm  Harlow Asa, PharmD, [place]  [phone]

## 2019-02-24 ENCOUNTER — Encounter (INDEPENDENT_AMBULATORY_CARE_PROVIDER_SITE_OTHER): Payer: Self-pay | Admitting: Vascular Surgery

## 2019-02-24 ENCOUNTER — Ambulatory Visit (INDEPENDENT_AMBULATORY_CARE_PROVIDER_SITE_OTHER): Payer: Medicare Other | Admitting: Vascular Surgery

## 2019-02-24 ENCOUNTER — Other Ambulatory Visit: Payer: Self-pay

## 2019-02-24 VITALS — BP 175/89 | HR 76 | Resp 12 | Ht 69.0 in | Wt 237.0 lb

## 2019-02-24 DIAGNOSIS — I10 Essential (primary) hypertension: Secondary | ICD-10-CM | POA: Diagnosis not present

## 2019-02-24 DIAGNOSIS — I890 Lymphedema, not elsewhere classified: Secondary | ICD-10-CM | POA: Diagnosis not present

## 2019-02-24 DIAGNOSIS — I739 Peripheral vascular disease, unspecified: Secondary | ICD-10-CM | POA: Diagnosis not present

## 2019-02-24 DIAGNOSIS — E782 Mixed hyperlipidemia: Secondary | ICD-10-CM | POA: Diagnosis not present

## 2019-02-24 NOTE — Patient Instructions (Signed)

## 2019-02-24 NOTE — Progress Notes (Signed)
MRN : [id number]  [name] is a 73 y.o. (1945/09/11) female who presents with chief complaint of  Chief Complaint  Patient presents with  . Follow-up  .  History of Present Illness: Patient returns today in follow up of her lymphedema.  Her leg swelling is significantly better with the regular use of the lymphedema pump which she started a few months ago.  She is a little more puffy today as she has not used the pump today.  She cannot tolerate compression stockings so after several hours the swelling returns.  She does feel as if she has more fluid in her abdomen.  She has had an extensive abdominal work-up which is been unrevealing so far.  She also has a renal artery duplex scheduled for the coming weeks secondary to worsening blood pressure and decline in renal function.  Current Outpatient Medications  Medication Sig Dispense Refill  . BD INSULIN SYRINGE U/F 31G X 5/16" 1 ML MISC USE AS DIRECTED. WITH HUMALOG 100 each 5  . Cholecalciferol (VITAMIN D3) 25 MCG (1000 UT) CAPS Take by mouth.    . COLCRYS 0.6 MG tablet Take as instructed every 4 hours as needed for acute gout flare up to 3 days 30 tablet 2  . cyclobenzaprine (FLEXERIL) 5 MG tablet TAKE 1 TABLET (5 MG TOTAL) BY MOUTH ONCE DAILY AS NEEDED FOR MUSCLE SPASMS 30 tablet 2  . famotidine (PEPCID) 20 MG tablet Take 20 mg by mouth 2 (two) times daily.    Marland Kitchen glucose blood (FREESTYLE LITE) test strip USE AS DIRECTED THREE TIMES DAILY FOR DIABETES 100 strip 4  . hydrochlorothiazide (HYDRODIURIL) 12.5 MG tablet Take 1 tablet (12.5 mg total) by mouth daily. (Patient taking differently: Take 12.5 mg by mouth daily. PRN per cardiologist.) 90 tablet 3  . insulin lispro (HUMALOG) 100 UNIT/ML injection Correction scale: take 1 unit per 50 over 150 before meals up to three times daily.  Up to 20 units per day. 20 mL 1  . Insulin Pen Needle (PEN NEEDLES) 32G X 5 MM MISC 1 Device by Does not apply route daily. 100 each 3  . lactase  (LACTAID) 3000 units tablet Take by mouth as needed.     . Lancets (FREESTYLE) lancets Must test x4/day    . nebivolol (BYSTOLIC) 10 MG tablet Take 2.5 tablets (25 mg total) by mouth daily. (Patient taking differently: Take 5 mg by mouth. Patient only taking 5mg . Prn as cardiology) 75 tablet 1  . prednisoLONE acetate (PRED FORTE) 1 % ophthalmic suspension INSTILL ONE DROP  as needed    . rosuvastatin (CRESTOR) 10 MG tablet Take by mouth.    . TRESIBA FLEXTOUCH 100 UNIT/ML SOPN FlexTouch Pen INJECT 56 UNITS SUBQ ONCE DAILY 18 mL 5   No current facility-administered medications for this visit.     Past Medical History:  Diagnosis Date  . Anterolisthesis    Cervical spine  . Asthma   . Connective tissue disorder (HCC)    recurrent carotid arteritis, temporal arteritis, vasculitis mandible, general hepatitis, avascular necrosis bilat  . DDD (degenerative disc disease), lumbar   . Diabetes mellitus without complication ([place])   . Diverticulosis   . Diverticulosis   . Duodenitis   . Gouty arthritis   . Hiatal hernia with GERD   . Hyperlipidemia   . Hypertension   . Hyperuricemia   . Keratitis sicca, bilateral   . Lymphedema of both lower extremities   . Osteoarthritis   . Pericarditis  Recurrent: Aug 97, July 05, Sept 08, July 16, July 18  . PUD (peptic ulcer disease)   . 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[place])   . Sjogren's syndrome ([place])   . Sjogren's syndrome ([place])   . Syncope   . Tendonitis, Achilles, right   . Tortuous colon   . Trigger finger     Past Surgical History:  Procedure Laterality Date  . ABDOMINAL HYSTERECTOMY    . BREAST BIOPSY    . BREAST EXCISIONAL BIOPSY Left 1986  . CHOLECYSTECTOMY    . COLONOSCOPY WITH PROPOFOL N/A 08/26/2018   Procedure: COLONOSCOPY WITH PROPOFOL;  Surgeon: Lucilla Lame, MD;  Location: [facility] ENDOSCOPY;  Service: Endoscopy;  Laterality: N/A;  . CYSTOSCOPY  02/26/2018   [name], MD   . distal arthrectomy    . ESOPHAGOGASTRODUODENOSCOPY (EGD)  WITH PROPOFOL N/A 08/26/2018   Procedure: ESOPHAGOGASTRODUODENOSCOPY (EGD) WITH PROPOFOL;  Surgeon: Lucilla Lame, MD;  Location: [facility] ENDOSCOPY;  Service: Endoscopy;  Laterality: N/A;  . EXCISION NEUROMA    . KNEE SURGERY Bilateral    1985, 2001, 11/2002, 12/2006  . panhysterectomy  10/1983  . SHOULDER SURGERY Right    reverse total arthroplasty w/ biceps tenodesis  . TONSILLECTOMY AND ADENOIDECTOMY    . TOTAL HIP ARTHROPLASTY Right 04/2007  . WISDOM TOOTH EXTRACTION      Social History Social History   Tobacco Use  . Smoking status: Former Smoker    Quit date: 01/22/1996    Years since quitting: 23.1  . Smokeless tobacco: Never Used  Substance Use Topics  . Alcohol use: Yes    Comment: occasional  . Drug use: Never     Family History Family History  Problem Relation Age of Onset  . Breast cancer Neg Hx     Allergies  Allergen Reactions  . Amlodipine     Other reaction(s): Unknown  . Aspirin     Other reaction(s): Unknown  . Bee Venom   . Ciprofloxacin     Other reaction(s): Other (see comments), Unknown  . Codeine     Other reaction(s): Unknown Must have pre medications before taking per patient report Includes all derivatives   . Erythromycin     Other reaction(s): Unknown, Unknown  . Glimepiride     Other reaction(s): Unknown  . Hydralazine     Other reaction(s): Unknown  . Hydrochlorothiazide     Other reaction(s): Dizziness or lightheadedness.  . Hydrocodone     Other reaction(s): Unknown Must have pre medications before taking per patient report  . Hydrocodone-Acetaminophen Nausea And Vomiting    MUST BE PREMEDICATED  . Hydromorphone Nausea And Vomiting    Other reaction(s): Unknown Must have pre medications before taking per patient report MUST BE PREMEDICATED   . Irbesartan     Other reaction(s): Unknown  . Irbesartan-Hydrochlorothiazide     Other reaction(s): Unknown  . Lisinopril     Other reaction(s): Unknown  . Maxitrol  [Neomycin-Polymyxin-Dexameth]   . Metformin And Related   . Metformin Hcl     Other reaction(s): Unknown  . Methylcellulose     Other reaction(s): Unknown  . Metoclopramide Nausea And Vomiting    Other reaction(s): Unknown  . Metoprolol     Other reaction(s): Unknown  . Neomycin-Bacitracin Zn-Polymyx     Other reaction(s): Unknown  . Norvasc [Amlodipine Besylate]   . Nsaids Other (See Comments)    Other reaction(s): Unknown bleeding   . Omeprazole Magnesium     Other reaction(s): Unknown  . Other  Pt is allergic to staples and surgical metals  . Oxycodone-Acetaminophen     Other reaction(s): Unknown, Unknown Must have pre medications before taking per patient report   . Penicillins     Other reaction(s): Unknown, Unknown  . Pioglitazone     Other reaction(s): Unknown  . Poison [employer]     Poison [place] and [place] also  . Prilosec [Omeprazole]   . Silver Sulfadiazine     Other reaction(s): Unknown  . Sulfa Antibiotics     Other reaction(s): Unknown, Unknown  . Tape     Other reaction(s): Unknown  . Tetanus Toxoid Other (See Comments)  . Tramadol     Other reaction(s): Unknown Must have pre medications before taking per patient report  . Betadine [Povidone Iodine] Rash    Only topically when left on skin.    REVIEW OF SYSTEMS (Negative unless checked)  Constitutional: [] ?Weight loss  [] ?Fever  [] ?Chills Cardiac: [] ?Chest pain   [] ?Chest pressure   [] ?Palpitations   [] ?Shortness of breath when laying flat   [] ?Shortness of breath at rest   [] ?Shortness of breath with exertion. Vascular:  [] ?Pain in legs with walking   [] ?Pain in legs at rest   [] ?Pain in legs when laying flat   [] ?Claudication   [] ?Pain in feet when walking  [] ?Pain in feet at rest  [] ?Pain in feet when laying flat   [] ?History of DVT   [] ?Phlebitis   [x] ?Swelling in legs   [] ?Varicose veins   [] ?Non-healing ulcers Pulmonary:   [] ?Uses home oxygen   [] ?Productive cough   [] ?Hemoptysis   [] ?Wheeze   [] ?COPD   [] ?Asthma Neurologic:  [] ?Dizziness  [] ?Blackouts   [] ?Seizures   [] ?History of stroke   [] ?History of TIA  [] ?Aphasia   [] ?Temporary blindness   [] ?Dysphagia   [] ?Weakness or numbness in arms   [] ?Weakness or numbness in legs Musculoskeletal:  [x] ?Arthritis   [] ?Joint swelling   [] ?Joint pain   [x] ?Low back pain Hematologic:  [] ?Easy bruising  [] ?Easy bleeding   [] ?Hypercoagulable state   [] ?Anemic  [] ?Hepatitis Gastrointestinal:  [] ?Blood in stool   [] ?Vomiting blood  [x] ?Gastroesophageal reflux/heartburn   [] ?Abdominal pain Genitourinary:  [] ?Chronic kidney disease   [] ?Difficult urination  [] ?Frequent urination  [] ?Burning with urination   [] ?Hematuria Skin:  [] ?Rashes   [] ?Ulcers   [] ?Wounds Psychological:  [] ?History of anxiety   [] ? History of major depression.  Physical Examination  BP (!) 175/89 (BP Location: Left Arm, Patient Position: Sitting, Cuff Size: Normal)   Pulse 76   Resp 12   Ht 5\' 9"  (1.753 m)   Wt 237 lb (107.5 kg)   BMI 35.00 kg/m  Gen:  WD/WN, NAD Head: [place]/AT, No temporalis wasting. Ear/Nose/Throat: Hearing grossly intact, nares w/o erythema or drainage Eyes: Conjunctiva clear. Sclera non-icteric Neck: Supple.  Trachea midline Pulmonary:  Good air movement, no use of accessory muscles.  Cardiac: RRR, no JVD Vascular:  Vessel Right Left  Radial Palpable Palpable                          PT Palpable Palpable  DP Palpable Palpable   Gastrointestinal: soft, non-tender/non-distended. No guarding/reflex.  Musculoskeletal: M/S 5/5 throughout.  No deformity or atrophy. 1+ BLE edema. Neurologic: Sensation grossly intact in extremities.  Symmetrical.  Speech is fluent.  Psychiatric: Judgment intact, Mood & affect appropriate for pt's clinical situation. Dermatologic: No rashes or ulcers noted.  No cellulitis or open wounds.  Labs Recent Results (from the past 2160 hour(s))  CBC with Differential     Status: Abnormal   Collection Time:  12/22/18  4:38 PM  Result Value Ref Range   WBC 11.2 (H) 4.0 - 10.5 K/uL   RBC 5.30 (H) 3.87 - 5.11 MIL/uL   Hemoglobin 15.1 (H) 12.0 - 15.0 g/dL   HCT 44.7 36.0 - 46.0 %   MCV 84.3 80.0 - 100.0 fL   MCH 28.5 26.0 - 34.0 pg   MCHC 33.8 30.0 - 36.0 g/dL   RDW 13.2 11.5 - 15.5 %   Platelets 296 150 - 400 K/uL   nRBC 0.0 0.0 - 0.2 %   Neutrophils Relative % 57 %   Neutro Abs 6.3 1.7 - 7.7 K/uL   Lymphocytes Relative 32 %   Lymphs Abs 3.6 0.7 - 4.0 K/uL   Monocytes Relative 8 %   Monocytes Absolute 0.8 0.1 - 1.0 K/uL   Eosinophils Relative 3 %   Eosinophils Absolute 0.4 0.0 - 0.5 K/uL   Basophils Relative 0 %   Basophils Absolute 0.1 0.0 - 0.1 K/uL   Immature Granulocytes 0 %   Abs Immature Granulocytes 0.04 0.00 - 0.07 K/uL    Comment: Performed at [facility], [address], [place], [place] [address]  Comprehensive metabolic panel     Status: Abnormal   Collection Time: 12/22/18  4:38 PM  Result Value Ref Range   Sodium 135 135 - 145 mmol/L   Potassium 3.8 3.5 - 5.1 mmol/L   Chloride 101 98 - 111 mmol/L   CO2 25 22 - 32 mmol/L   Glucose, Bld 145 (H) 70 - 99 mg/dL   BUN 15 8 - 23 mg/dL   Creatinine, Ser 0.88 0.44 - 1.00 mg/dL   Calcium 8.9 8.9 - 10.3 mg/dL   Total Protein 7.1 6.5 - 8.1 g/dL   Albumin 3.9 3.5 - 5.0 g/dL   AST 23 15 - 41 U/L   ALT 22 0 - 44 U/L   Alkaline Phosphatase 88 38 - 126 U/L   Total Bilirubin 0.6 0.3 - 1.2 mg/dL   GFR calc non Af Amer >60 >60 mL/min   GFR calc Af Amer >60 >60 mL/min   Anion gap 9 5 - 15    Comment: Performed at [facility], [address], [place],  [address]  Urine Culture     Status: Abnormal   Collection Time: 01/02/19  3:16 PM   Specimen: Urine  Result Value Ref Range   MICRO NUMBER: [id number]    SPECIMEN QUALITY: Adequate    Sample Source URINE    STATUS: FINAL    ISOLATE 1: Klebsiella pneumoniae (A)     Comment: Greater than 100,000 CFU/mL of Klebsiella pneumoniae      Susceptibility    Klebsiella pneumoniae - URINE CULTURE, REFLEX    AMOX/CLAVULANIC <=2 Sensitive     AMPICILLIN >=32 Resistant     AMPICILLIN/SULBACTAM 8 Sensitive     CEFAZOLIN* <=4 Not Reportable      * For infections other than uncomplicated UTIcaused by E. coli, K. pneumoniae or P. mirabilis:Cefazolin is resistant if MIC > or = 8 mcg/mL.(Distinguishing susceptible versus intermediatefor isolates with MIC < or = 4 mcg/mL requiresadditional testing.)For uncomplicated UTI caused by E. coli,K. pneumoniae or P. mirabilis: Cefazolin issusceptible if MIC <32 mcg/mL and predictssusceptible to the oral agents cefaclor, cefdinir,cefpodoxime, cefprozil, cefuroxime, cephalexinand loracarbef.    CEFEPIME <=1 Sensitive  CEFTRIAXONE <=1 Sensitive     CIPROFLOXACIN <=0.25 Sensitive     LEVOFLOXACIN <=0.12 Sensitive     ERTAPENEM <=0.5 Sensitive     GENTAMICIN <=1 Sensitive     IMIPENEM <=0.25 Sensitive     NITROFURANTOIN 64 Intermediate     PIP/TAZO <=4 Sensitive     TOBRAMYCIN <=1 Sensitive     TRIMETH/SULFA* <=20 Sensitive      * For infections other than uncomplicated UTIcaused by E. coli, K. pneumoniae or P. mirabilis:Cefazolin is resistant if MIC > or = 8 mcg/mL.(Distinguishing susceptible versus intermediatefor isolates with MIC < or = 4 mcg/mL requiresadditional testing.)For uncomplicated UTI caused by E. coli,K. pneumoniae or P. mirabilis: Cefazolin issusceptible if MIC <32 mcg/mL and predictssusceptible to the oral agents cefaclor, cefdinir,cefpodoxime, cefprozil, cefuroxime, cephalexinand loracarbef.Legend:S = Susceptible  I = IntermediateR = Resistant  NS = Not susceptible* = Not tested  NR = Not reported**NN = See antimicrobic comments  POCT urinalysis dipstick     Status: Abnormal   Collection Time: 01/02/19  3:17 PM  Result Value Ref Range   Color, UA amber    Clarity, UA cloudy    Glucose, UA Negative Negative   Bilirubin, UA negative    Ketones, UA negative    Spec Grav, UA 1.010 1.010 - 1.025    Blood, UA moderate    pH, UA 5.0 5.0 - 8.0   Protein, UA Negative Negative   Urobilinogen, UA 0.2 0.2 or 1.0 E.U./dL   Nitrite, UA negative    Leukocytes, UA Moderate (2+) (A) Negative   Appearance cloudy    Odor none   COMPLETE METABOLIC PANEL WITH GFR     Status: Abnormal   Collection Time: 02/10/19 10:53 AM  Result Value Ref Range   Glucose, Bld 90 65 - 99 mg/dL    Comment: .            Fasting reference interval .    BUN 13 7 - 25 mg/dL   Creat 0.99 (H) 0.60 - 0.93 mg/dL    Comment: For patients >36 years of age, the reference limit for Creatinine is approximately 13% higher for people identified as African-American. .    GFR, Est Non African American 57 (L) > OR = 60 mL/min/1.59m2   GFR, Est African American 66 > OR = 60 mL/min/1.59m2   BUN/Creatinine Ratio 13 6 - 22 (calc)   Sodium 141 135 - 146 mmol/L   Potassium 5.0 3.5 - 5.3 mmol/L   Chloride 104 98 - 110 mmol/L   CO2 29 20 - 32 mmol/L   Calcium 9.4 8.6 - 10.4 mg/dL   Total Protein 6.4 6.1 - 8.1 g/dL   Albumin 3.7 3.6 - 5.1 g/dL   Globulin 2.7 1.9 - 3.7 g/dL (calc)   AG Ratio 1.4 1.0 - 2.5 (calc)   Total Bilirubin 0.5 0.2 - 1.2 mg/dL   Alkaline phosphatase (APISO) 80 37 - 153 U/L   AST 16 10 - 35 U/L   ALT 15 6 - 29 U/L  CBC with Differential/Platelet     Status: Abnormal   Collection Time: 02/10/19 10:53 AM  Result Value Ref Range   WBC 9.2 3.8 - 10.8 Thousand/uL   RBC 5.32 (H) 3.80 - 5.10 Million/uL   Hemoglobin 14.8 11.7 - 15.5 g/dL   HCT 45.7 (H) 35.0 - 45.0 %   MCV 85.9 80.0 - 100.0 fL   MCH 27.8 27.0 - 33.0 pg   MCHC 32.4 32.0 - 36.0 g/dL  RDW 13.3 11.0 - 15.0 %   Platelets 319 140 - 400 Thousand/uL   MPV 10.2 7.5 - 12.5 fL   Neutro Abs 4,986 1,500 - 7,800 cells/uL   Lymphs Abs 3,183 850 - 3,900 cells/uL   Absolute Monocytes 635 200 - 950 cells/uL   Eosinophils Absolute 313 15 - 500 cells/uL   Basophils Absolute 83 0 - 200 cells/uL   Neutrophils Relative % 54.2 %   Total Lymphocyte 34.6 %    Monocytes Relative 6.9 %   Eosinophils Relative 3.4 %   Basophils Relative 0.9 %  Lipid panel     Status: Abnormal   Collection Time: 02/10/19 10:53 AM  Result Value Ref Range   Cholesterol 169 <200 mg/dL   HDL 46 (L) > OR = 50 mg/dL   Triglycerides 129 <150 mg/dL   LDL Cholesterol (Calc) 100 (H) mg/dL (calc)    Comment: Reference range: <100 . Desirable range <100 mg/dL for primary prevention;   <70 mg/dL for patients with CHD or diabetic patients  with > or = 2 CHD risk factors. Marland Kitchen LDL-C is now calculated using the Martin-Hopkins  calculation, which is a validated novel method providing  better accuracy than the Friedewald equation in the  estimation of LDL-C.  Cresenciano Genre et al. Annamaria Helling. [id number]): 2061-2068  (http://education.[facility].com/faq/FAQ164)    Total CHOL/HDL Ratio 3.7 <5.0 (calc)   Non-HDL Cholesterol (Calc) 123 <130 mg/dL (calc)    Comment: For patients with diabetes plus 1 major ASCVD risk  factor, treating to a non-HDL-C goal of <100 mg/dL  (LDL-C of <70 mg/dL) is considered a therapeutic  option.   Hemoglobin A1c     Status: Abnormal   Collection Time: 02/10/19 10:53 AM  Result Value Ref Range   Hgb A1c MFr Bld 7.6 (H) <5.7 % of total Hgb    Comment: For someone without known diabetes, a hemoglobin A1c value of 6.5% or greater indicates that they may have  diabetes and this should be confirmed with a follow-up  test. . For someone with known diabetes, a value <7% indicates  that their diabetes is well controlled and a value  greater than or equal to 7% indicates suboptimal  control. A1c targets should be individualized based on  duration of diabetes, age, comorbid conditions, and  other considerations. . Currently, no consensus exists regarding use of hemoglobin A1c for diagnosis of diabetes for children. .    Mean Plasma Glucose 171 (calc)   eAG (mmol/L) 9.5 (calc)  Sedimentation rate     Status: Abnormal   Collection Time: 02/10/19 10:53  AM  Result Value Ref Range   Sed Rate 67 (H) 0 - 30 mm/h  C-reactive protein     Status: Abnormal   Collection Time: 02/10/19 10:53 AM  Result Value Ref Range   CRP 8.4 (H) <8.0 mg/L    Radiology No results found.  Assessment/Plan Benign essential HTN blood pressure control important in reducing the progression of atherosclerotic disease. On appropriate oral medications.  If she has significant renal artery stenosis, she said she would contact us and we will be happy to perform intervention for her renal artery disease   PAD (peripheral artery disease) (HCC) Pedal pulses present, do no suspect any significant arterial insufficiency  Hyperlipidemia lipid control important in reducing the progression of atherosclerotic disease. Continue statin therapy  Lymphedema Symptoms much improved with the pump.  She does have swelling up into her abdomen and it is possible that she would benefit from a  lymphedema pump that would actually involve abdominal components as well.  She wants to hold on that for now which is reasonable.  We will see her back in 6 months.    Leotis Pain, MD  02/24/2019 2:36 PM    This note was created with Dragon medical transcription system.  Any errors from dictation are purely unintentional

## 2019-02-24 NOTE — Assessment & Plan Note (Signed)
Symptoms much improved with the pump.  She does have swelling up into her abdomen and it is possible that she would benefit from a lymphedema pump that would actually involve abdominal components as well.  She wants to hold on that for now which is reasonable.  We will see her back in 6 months.

## 2019-02-25 ENCOUNTER — Ambulatory Visit
Admission: RE | Admit: 2019-02-25 | Discharge: 2019-02-25 | Disposition: A | Discharge status: Home / Self Care | Payer: Medicare Other | Source: Ambulatory Visit | Attending: Nurse Practitioner | Admitting: Nurse Practitioner

## 2019-02-25 DIAGNOSIS — I10 Essential (primary) hypertension: Secondary | ICD-10-CM

## 2019-02-25 DIAGNOSIS — I129 Hypertensive chronic kidney disease with stage 1 through stage 4 chronic kidney disease, or unspecified chronic kidney disease: Secondary | ICD-10-CM | POA: Diagnosis not present

## 2019-03-02 ENCOUNTER — Ambulatory Visit (INDEPENDENT_AMBULATORY_CARE_PROVIDER_SITE_OTHER): Payer: Medicare Other | Admitting: Pharmacist

## 2019-03-02 ENCOUNTER — Telehealth: Payer: Self-pay

## 2019-03-02 DIAGNOSIS — I10 Essential (primary) hypertension: Secondary | ICD-10-CM

## 2019-03-02 DIAGNOSIS — E1140 Type 2 diabetes mellitus with diabetic neuropathy, unspecified: Secondary | ICD-10-CM | POA: Diagnosis not present

## 2019-03-02 NOTE — Chronic Care Management (AMB) (Signed)
Chronic Care Management   Follow Up Note   03/02/2019 Name: [name] MRN: [id number] DOB: [birth year]  Referred by: Mikey College, NP Reason for referral : Chronic Care Management (Patient Phone Call)   [name] is a 73 y.o. year old female who is a primary care patient of Mikey College, NP. The CCM team was consulted for assistance with chronic disease management and care coordination needs. [name] has a past medical history including but not limited to hypertension, lymphedema, type 2 diabetes mellitus and GERD.  I reached out to [name] by phone today.   Review of patient status, including review of consultants reports, relevant laboratory and other test results, and collaboration with appropriate care team members and the patient's provider was performed as part of comprehensive patient evaluation and provision of chronic care management services.     Outpatient Encounter Medications as of 03/02/2019  Medication Sig  . ARTIFICIAL TEAR OP Apply to eye.  . Cholecalciferol (VITAMIN D3) 25 MCG (1000 UT) CAPS Take 4 capsules by mouth daily.   . cyclobenzaprine (FLEXERIL) 5 MG tablet TAKE 1 TABLET (5 MG TOTAL) BY MOUTH ONCE DAILY AS NEEDED FOR MUSCLE SPASMS  . famotidine (PEPCID) 20 MG tablet Take 20 mg by mouth 2 (two) times daily.  . hydrochlorothiazide (HYDRODIURIL) 12.5 MG tablet Take 1 tablet (12.5 mg total) by mouth daily. (Patient taking differently: Take 12.5 mg by mouth daily. PRN per cardiologist.)  . insulin lispro (HUMALOG) 100 UNIT/ML injection Correction scale: take 1 unit per 50 over 150 before meals up to three times daily.  Up to 20 units per day.  . nebivolol (BYSTOLIC) 10 MG tablet Take 2.5 tablets (25 mg total) by mouth daily. (Patient taking differently: Take 5 mg by mouth. Patient only taking 5mg . Prn as cardiology)  . Nutritional Supplements (NUTRITIONAL SUPPLEMENT PO) Take by mouth. "Blood Boost Formula" by [employer]  . TRESIBA FLEXTOUCH 100 UNIT/ML SOPN FlexTouch Pen INJECT 56 UNITS SUBQ ONCE DAILY  . acetaminophen (TYLENOL) 500 MG tablet Take 500 mg by mouth daily as needed.  . BD INSULIN SYRINGE U/F 31G X 5/16" 1 ML MISC USE AS DIRECTED. WITH HUMALOG  . clindamycin (CLEOCIN) 300 MG capsule Take 600 mg by mouth. 1 Hour before dental procedures  . COLCRYS 0.6 MG tablet Take as instructed every 4 hours as needed for acute gout flare up to 3 days (Patient not taking: Reported on 03/02/2019)  . glucose blood (FREESTYLE LITE) test strip USE AS DIRECTED THREE TIMES DAILY FOR DIABETES  . Insulin Pen Needle (PEN NEEDLES) 32G X 5 MM MISC 1 Device by Does not apply route daily.  Marland Kitchen lactase (LACTAID) 3000 units tablet Take by mouth as needed.   . Lancets (FREESTYLE) lancets Must test x4/day  . prednisoLONE acetate (PRED FORTE) 1 % ophthalmic suspension INSTILL ONE DROP  as needed  . rosuvastatin (CRESTOR) 10 MG tablet Take 10 mg by mouth daily.    No facility-administered encounter medications on file as of 03/02/2019.     Goals Addressed            This Visit's Progress   . PharmD - "I'm in the donut hole" (pt-stated)       Current Barriers:  . Financial Barriers: patient has [place] [employer] Preferred Part D insurance and reports copay for Tresiba, Humalog, Bystolic and Colcrys is cost prohibitive at this time o Currently in the coverage gap of her coverage  Pharmacist Clinical  Goal(s):  Marland Kitchen Over the next 30 days, patient will work with PharmD and providers to relieve medication access concerns  Interventions: . Follow up with patient regarding patient assistance paperwork for [place] through [employer], Humalog through [employer], [employer] through [place] through [place] o [name] confirms that she picked up patient assistance applications from office and has started to complete these o Address patient's questions regarding patient assistance applications . Comprehensive  medication review performed; medication list updated in electronic medical record o [name] reports that she follows with Dr. Rockey Situ for management of her hypertension. Reports she monitors her blood pressure as directed by Cardiologist and adjusts her Bystolic and hydrochlorothiazide accordingly o Counsel on potential for sedation/dizziness with cyclobenzaprine, including next day, due to half-life of medication. [name] understanding. Denies side effects with current dose. o Reports started taking "Blood Boost Formula" by [employer] a couple of months ago as she read about the supplement being recommended by Dr. Irena Cords for blood sugar control. - Counsel patient regarding lack of data to confirm whether this supplement is safe, effective or could interact with her medications.  . Review data from Natural Medicines Database - currently no safety or efficacy data available on this supplement combination in this resource. . Discuss importance of blood sugar monitoring and control o Reports taking Tresiba daily and Humalog correction as directed o Reports recent morning fasting CBGs ranging 80s-120s  Patient Self Care Activities:  . Patient will provide necessary portions of application   Please see past updates related to this goal by clicking on the "Past Updates" button in the selected goal         Plan [name] to follow up with CM Pharmacist when she has completed all patient assistance program paperwork and gathered required documents.  [name], PharmD, [place] [employer] [phone]

## 2019-03-02 NOTE — Patient Instructions (Signed)
Thank you allowing the Chronic Care Management Team to be a part of your care! It was a pleasure speaking with you today!     CCM (Chronic Care Management) Team    Janci Minor RN, BSN Nurse Care Coordinator  [phone]   Harlow Asa PharmD  Clinical Pharmacist  [phone]   Eula Fried LCSW Clinical Social Worker [phone]  Visit Information  Goals Addressed            This Visit's Progress   . PharmD - "I'm in the donut hole" (pt-stated)       Current Barriers:  . Financial Barriers: patient has [place] [employer] Preferred Part D insurance and reports copay for Tresiba, Humalog, Bystolic and Colcrys is cost prohibitive at this time o Currently in the coverage gap of her coverage  Pharmacist Clinical Goal(s):  Marland Kitchen Over the next 30 days, patient will work with PharmD and providers to relieve medication access concerns  Interventions: . Follow up with patient regarding patient assistance paperwork for [place] through [employer], Humalog through [employer], [employer] through [place] through [place] o Ms. Glatz confirms that she picked up patient assistance applications from office and has started to complete these o Address patient's questions regarding patient assistance applications . Comprehensive medication review performed; medication list updated in electronic medical record o Ms. Lemos reports that she follows with Dr. Rockey Situ for management of her hypertension. Reports she monitors her blood pressure as directed by Cardiologist and adjusts her Bystolic and hydrochlorothiazide accordingly o Counsel on potential for sedation/dizziness with cyclobenzaprine, including next day, due to half-life of medication. Ms. theary betterton understanding. Denies side effects with current dose. o Reports started taking "Blood Boost Formula" by [employer] a couple of months ago as she read about the supplement being recommended by Dr. Irena Cords for blood  sugar control. - Counsel patient regarding lack of data to confirm whether this supplement is safe, effective or could interact with her medications.  . Review data from Natural Medicines Database - currently no safety or efficacy data available on this supplement combination in this resource. . Discuss importance of blood sugar monitoring and control o Reports taking Tresiba daily and Humalog correction as directed o Reports recent morning fasting CBGs ranging 80s-120s  Patient Self Care Activities:  . Patient will provide necessary portions of application   Please see past updates related to this goal by clicking on the "Past Updates" button in the selected goal         The patient verbalized understanding of instructions provided today and declined a print copy of patient instruction materials.   The care management team will reach out to the patient again over the next 20 days.   Harlow Asa, PharmD, [place] [employer] [phone]

## 2019-03-06 ENCOUNTER — Other Ambulatory Visit: Payer: Self-pay | Admitting: Nurse Practitioner

## 2019-03-06 ENCOUNTER — Ambulatory Visit: Payer: Self-pay | Admitting: Pharmacist

## 2019-03-06 DIAGNOSIS — E1140 Type 2 diabetes mellitus with diabetic neuropathy, unspecified: Secondary | ICD-10-CM

## 2019-03-06 MED ORDER — TRESIBA FLEXTOUCH 200 UNIT/ML ~~LOC~~ SOPN: 56.0000 [IU] | PEN_INJECTOR | Freq: Every day | SUBCUTANEOUS | 11 refills | Status: DC

## 2019-03-06 NOTE — Patient Instructions (Signed)
Thank you allowing the Chronic Care Management Team to be a part of your care! It was a pleasure speaking with you today!     CCM (Chronic Care Management) Team    Janci Minor RN, BSN Nurse Care Coordinator  [phone]   Harlow Asa PharmD  Clinical Pharmacist  [phone]   Eula Fried LCSW Clinical Social Worker [phone]  Visit Information  Goals Addressed            This Visit's Progress   . PharmD - "I'm in the donut hole" (pt-stated)       Current Barriers:  . Financial Barriers: patient has [place] [employer] Preferred Part D insurance and reports copay for Tresiba, Humalog, Bystolic and Colcrys is cost prohibitive at this time o Currently in the coverage gap of her coverage  Pharmacist Clinical Goal(s):  Marland Kitchen Over the next 30 days, patient will work with PharmD and providers to relieve medication access concerns  Interventions: . Receive call from patient regarding patient assistance paperwork for [place] through [employer], Humalog through [employer], [employer] through [place] through [place] o Ms. Napolitan reports that she has just brough all 4 applications, as well as supporting financial documentation and receipt into the office . Collaborate with PCP and office CMA regarding provider portion of paperwork  Patient Self Care Activities:  . Patient will provide necessary portions of application   Please see past updates related to this goal by clicking on the "Past Updates" button in the selected goal         The patient verbalized understanding of instructions provided today and declined a print copy of patient instruction materials.   The care management team will reach out to the patient again over the next 14 days.   Harlow Asa, PharmD, [place] [employer] [phone]

## 2019-03-06 NOTE — Chronic Care Management (AMB) (Signed)
Chronic Care Management   Follow Up Note   03/06/2019 Name: [name] MRN: [id number] DOB: [birth year]  Referred by: Mikey College, NP Reason for referral : Chronic Care Management (Patient Phone Call)   [name] is a 73 y.o. year old female who is a primary care patient of Mikey College, NP. The CCM team was consulted for assistance with chronic disease management and care coordination needs.  [name] has a past medical history including but not limited to hypertension, lymphedema, type 2 diabetes mellitus and GERD.  Incoming call from [name] today.   Review of patient status, including review of consultants reports, relevant laboratory and other test results, and collaboration with appropriate care team members and the patient's provider was performed as part of comprehensive patient evaluation and provision of chronic care management services.    Outpatient Encounter Medications as of 03/06/2019  Medication Sig  . acetaminophen (TYLENOL) 500 MG tablet Take 500 mg by mouth daily as needed.  . ARTIFICIAL TEAR OP Apply to eye.  . BD INSULIN SYRINGE U/F 31G X 5/16" 1 ML MISC USE AS DIRECTED. WITH HUMALOG  . Cholecalciferol (VITAMIN D3) 25 MCG (1000 UT) CAPS Take 4 capsules by mouth daily.   . clindamycin (CLEOCIN) 300 MG capsule Take 600 mg by mouth. 1 Hour before dental procedures  . COLCRYS 0.6 MG tablet Take as instructed every 4 hours as needed for acute gout flare up to 3 days (Patient not taking: Reported on 03/02/2019)  . cyclobenzaprine (FLEXERIL) 5 MG tablet TAKE 1 TABLET (5 MG TOTAL) BY MOUTH ONCE DAILY AS NEEDED FOR MUSCLE SPASMS  . famotidine (PEPCID) 20 MG tablet Take 20 mg by mouth 2 (two) times daily.  Marland Kitchen glucose blood (FREESTYLE LITE) test strip USE AS DIRECTED THREE TIMES DAILY FOR DIABETES  . hydrochlorothiazide (HYDRODIURIL) 12.5 MG tablet Take 1 tablet (12.5 mg total) by mouth daily. (Patient taking differently: Take  12.5 mg by mouth daily. PRN per cardiologist.)  . insulin lispro (HUMALOG) 100 UNIT/ML injection Correction scale: take 1 unit per 50 over 150 before meals up to three times daily.  Up to 20 units per day.  . Insulin Pen Needle (PEN NEEDLES) 32G X 5 MM MISC 1 Device by Does not apply route daily.  Marland Kitchen lactase (LACTAID) 3000 units tablet Take by mouth as needed.   . Lancets (FREESTYLE) lancets Must test x4/day  . nebivolol (BYSTOLIC) 10 MG tablet Take 2.5 tablets (25 mg total) by mouth daily. (Patient taking differently: Take 5 mg by mouth. Patient only taking 5mg . Prn as cardiology)  . Nutritional Supplements (NUTRITIONAL SUPPLEMENT PO) Take by mouth. "Blood Boost Formula" by [employer]  . prednisoLONE acetate (PRED FORTE) 1 % ophthalmic suspension INSTILL ONE DROP  as needed  . rosuvastatin (CRESTOR) 10 MG tablet Take 10 mg by mouth daily.   . TRESIBA FLEXTOUCH 100 UNIT/ML SOPN FlexTouch Pen INJECT 56 UNITS SUBQ ONCE DAILY   No facility-administered encounter medications on file as of 03/06/2019.     Goals Addressed            This Visit's Progress   . PharmD - "I'm in the donut hole" (pt-stated)       Current Barriers:  . Financial Barriers: patient has [place] [employer] Preferred Part D insurance and reports copay for Tresiba, Humalog, Bystolic and Colcrys is cost prohibitive at this time o Currently in the coverage gap of her coverage  Pharmacist Clinical Goal(s):  .  Over the next 30 days, patient will work with PharmD and providers to relieve medication access concerns  Interventions: . Receive call from patient regarding patient assistance paperwork for [place] through [employer], Humalog through [employer], [employer] through [place] through [place] o [name] reports that she has just brough all 4 applications, as well as supporting financial documentation and receipt into the office . Collaborate with PCP and office CMA regarding provider portion of  paperwork o Request all portions of applications be faxed to [place], Karrie Meres (fax # [phone])  for submission when provider portion completed  Patient Self Care Activities:  . Patient will provide necessary portions of application   Please see past updates related to this goal by clicking on the "Past Updates" button in the selected goal         Plan  The care management team will reach out to the patient again over the next 14 days.   Harlow Asa, PharmD, [place] [employer] [phone]

## 2019-03-18 ENCOUNTER — Telehealth: Payer: Self-pay

## 2019-03-20 ENCOUNTER — Telehealth: Payer: Self-pay

## 2019-03-20 ENCOUNTER — Ambulatory Visit: Payer: Self-pay | Admitting: Pharmacist

## 2019-03-20 DIAGNOSIS — E1140 Type 2 diabetes mellitus with diabetic neuropathy, unspecified: Secondary | ICD-10-CM

## 2019-03-20 NOTE — Chronic Care Management (AMB) (Signed)
Chronic Care Management   Follow Up Note   03/20/2019 Name: [name] MRN: [id number] DOB: Feb 13, 1946  Referred by: Mikey College, NP Reason for referral : No chief complaint on file.   [name] is a 73 y.o. year old female who is a primary care patient of Mikey College, NP. The CCM team was consulted for assistance with chronic disease management and care coordination needs. [name] has a past medical history including but not limited to hypertension, lymphedema, type 2 diabetes mellitus and GERD.  Place coordination of care calls to patient assistance programs for [place] through [employer], Humalog through [employer], [employer] through [place] through [place]  Was unable to reach patient via telephone today and have left HIPAA compliant voicemail asking patient to return my call.   Review of patient status, including review of consultants reports, relevant laboratory and other test results, and collaboration with appropriate care team members and the patient's provider was performed as part of comprehensive patient evaluation and provision of chronic care management services.    Outpatient Encounter Medications as of 03/20/2019  Medication Sig  . acetaminophen (TYLENOL) 500 MG tablet Take 500 mg by mouth daily as needed.  . ARTIFICIAL TEAR OP Apply to eye.  . BD INSULIN SYRINGE U/F 31G X 5/16" 1 ML MISC USE AS DIRECTED. WITH HUMALOG  . Cholecalciferol (VITAMIN D3) 25 MCG (1000 UT) CAPS Take 4 capsules by mouth daily.   . clindamycin (CLEOCIN) 300 MG capsule Take 600 mg by mouth. 1 Hour before dental procedures  . COLCRYS 0.6 MG tablet Take as instructed every 4 hours as needed for acute gout flare up to 3 days (Patient not taking: Reported on 03/02/2019)  . cyclobenzaprine (FLEXERIL) 5 MG tablet TAKE 1 TABLET (5 MG TOTAL) BY MOUTH ONCE DAILY AS NEEDED FOR MUSCLE SPASMS  . famotidine (PEPCID) 20 MG tablet Take 20 mg by mouth 2 (two)  times daily.  Marland Kitchen glucose blood (FREESTYLE LITE) test strip USE AS DIRECTED THREE TIMES DAILY FOR DIABETES  . hydrochlorothiazide (HYDRODIURIL) 12.5 MG tablet Take 1 tablet (12.5 mg total) by mouth daily. (Patient taking differently: Take 12.5 mg by mouth daily. PRN per cardiologist.)  . Insulin Degludec (TRESIBA FLEXTOUCH) 200 UNIT/ML SOPN Inject 56 Units into the skin daily.  . insulin lispro (HUMALOG) 100 UNIT/ML injection Correction scale: take 1 unit per 50 over 150 before meals up to three times daily.  Up to 20 units per day.  . Insulin Pen Needle (PEN NEEDLES) 32G X 5 MM MISC 1 Device by Does not apply route daily.  Marland Kitchen lactase (LACTAID) 3000 units tablet Take by mouth as needed.   . Lancets (FREESTYLE) lancets Must test x4/day  . nebivolol (BYSTOLIC) 10 MG tablet Take 2.5 tablets (25 mg total) by mouth daily. (Patient taking differently: Take 5 mg by mouth. Patient only taking 5mg . Prn as cardiology)  . Nutritional Supplements (NUTRITIONAL SUPPLEMENT PO) Take by mouth. "Blood Boost Formula" by [employer]  . prednisoLONE acetate (PRED FORTE) 1 % ophthalmic suspension INSTILL ONE DROP  as needed  . rosuvastatin (CRESTOR) 10 MG tablet Take 10 mg by mouth daily.    No facility-administered encounter medications on file as of 03/20/2019.     Goals Addressed            This Visit's Progress   . PharmD - "I'm in the donut hole" (pt-stated)       Current Barriers:  . Financial Barriers: patient  has [employer] Preferred Part D insurance and reports copay for Tresiba, Humalog, Bystolic and Colcrys is cost prohibitive at this time o Currently in the coverage gap of her coverage  Pharmacist Clinical Goal(s):  Marland Kitchen Over the next 30 days, patient will work with PharmD and providers to relieve medication access concerns  Interventions: . Coordinated care with [place]. Patient assistance paperwork for [place] through [employer], Humalog through [employer], [place] through  [place] through [place] faxed to respective companies on 03/13/2019. Marland Kitchen Care coordination call today to [employer] [phone]) in regard to patient's application for Tresiba. o Speak to Peter Congo who states patient APPROVED through 05/11/2019 (Patient ID #: [id number]). She informed a 90 day supply will be shipped to office in 10-14 days. . Care coordination call today to [facility] [phone]) in regard to patient's application for Humalog. o Speak to [facility] who states patient APPROVED 03/16/2019-06/11/2019. She informed the order was in progress with the pharmacy and a 4 month supply will be shipped to patient's home in 10-30 days.  . Care coordination call today to Bernita Buffy [phone]) in regard to patient's application for Colcrys. o Speak to Aniceto Boss who states patient APPROVED 03/13/2019-06/11/2019. She informed the order was in progress with the pharmacy and a 90 day supply will be shipped to patient. . Care coordination call today to [place] [phone]) in regard to patient's application for Bystolic o Speak to Denita, who states she is unable to find patient in system, but that it can take up to 10 business days for review once application received. Recommends call back on 10/16 for status. . Unable to reach [name] by phone today.  Patient Self Care Activities:  . Patient will provide necessary portions of application   Please see past updates related to this goal by clicking on the "Past Updates" button in the selected goal         Plan  The care management team will reach out to the patient again over the next 7 days.   Harlow Asa, PharmD, [place] [employer] [phone]

## 2019-03-23 ENCOUNTER — Ambulatory Visit: Payer: Self-pay | Admitting: Pharmacist

## 2019-03-23 DIAGNOSIS — E1140 Type 2 diabetes mellitus with diabetic neuropathy, unspecified: Secondary | ICD-10-CM

## 2019-03-23 NOTE — Chronic Care Management (AMB) (Signed)
Chronic Care Management   Follow Up Note   03/23/2019 Name: [name] MRN: [id number] DOB: 04-Jul-1945  Referred by: Patricia College, NP Reason for referral : Chronic Care Management (Patient Phone Call)   [name] is a 73 y.o. year old female who is a primary care patient of Patricia College, NP. The CCM team was consulted for assistance with chronic disease management and care coordination needs.  [name] has a past medical history including but not limited to hypertension, lymphedema, type 2 diabetes mellitus and GERD.  Receive incoming call back from [name] today.   Review of patient status, including review of consultants reports, relevant laboratory and other test results, and collaboration with appropriate care team members and the patient's provider was performed as part of comprehensive patient evaluation and provision of chronic care management services.    Outpatient Encounter Medications as of 03/23/2019  Medication Sig  . acetaminophen (TYLENOL) 500 MG tablet Take 500 mg by mouth daily as needed.  . ARTIFICIAL TEAR OP Apply to eye.  . BD INSULIN SYRINGE U/F 31G X 5/16" 1 ML MISC USE AS DIRECTED. WITH HUMALOG  . Cholecalciferol (VITAMIN D3) 25 MCG (1000 UT) CAPS Take 4 capsules by mouth daily.   . clindamycin (CLEOCIN) 300 MG capsule Take 600 mg by mouth. 1 Hour before dental procedures  . COLCRYS 0.6 MG tablet Take as instructed every 4 hours as needed for acute gout flare up to 3 days (Patient not taking: Reported on 03/02/2019)  . cyclobenzaprine (FLEXERIL) 5 MG tablet TAKE 1 TABLET (5 MG TOTAL) BY MOUTH ONCE DAILY AS NEEDED FOR MUSCLE SPASMS  . famotidine (PEPCID) 20 MG tablet Take 20 mg by mouth 2 (two) times daily.  Marland Kitchen glucose blood (FREESTYLE LITE) test strip USE AS DIRECTED THREE TIMES DAILY FOR DIABETES  . hydrochlorothiazide (HYDRODIURIL) 12.5 MG tablet Take 1 tablet (12.5 mg total) by mouth daily. (Patient taking  differently: Take 12.5 mg by mouth daily. PRN per cardiologist.)  . Insulin Degludec (TRESIBA FLEXTOUCH) 200 UNIT/ML SOPN Inject 56 Units into the skin daily.  . insulin lispro (HUMALOG) 100 UNIT/ML injection Correction scale: take 1 unit per 50 over 150 before meals up to three times daily.  Up to 20 units per day.  . Insulin Pen Needle (PEN NEEDLES) 32G X 5 MM MISC 1 Device by Does not apply route daily.  Marland Kitchen lactase (LACTAID) 3000 units tablet Take by mouth as needed.   . Lancets (FREESTYLE) lancets Must test x4/day  . nebivolol (BYSTOLIC) 10 MG tablet Take 2.5 tablets (25 mg total) by mouth daily. (Patient taking differently: Take 5 mg by mouth. Patient only taking 5mg . Prn as cardiology)  . Nutritional Supplements (NUTRITIONAL SUPPLEMENT PO) Take by mouth. "Blood Boost Formula" by [employer]  . prednisoLONE acetate (PRED FORTE) 1 % ophthalmic suspension INSTILL ONE DROP  as needed  . rosuvastatin (CRESTOR) 10 MG tablet Take 10 mg by mouth daily.    No facility-administered encounter medications on file as of 03/23/2019.     Goals Addressed            This Visit's Progress   . PharmD - "I'm in the donut hole" (pt-stated)       Current Barriers:  . Financial Barriers: patient has [place] [employer] Preferred Part D insurance and reports copay for Tresiba, Humalog, Bystolic and Colcrys is cost prohibitive at this time o Currently in the coverage gap of her coverage  Pharmacist Clinical  Goal(s):  Marland Kitchen Over the next 30 days, patient will work with PharmD and providers to relieve medication access concerns  Interventions: . Follow up with [name] regarding status of patient assistance applications. Advise patient: o She was approved for assistance for Tresiba from [employer] through 05/11/2019 and to receive a 90 day supply, shipped to office in 10-14 days. o She was approved for assistance for Humalog through [place] 03/16/2019-06/11/2019 and to receive a 4 month supply to be  shipped to her home in 10-30 days.  o She was approved for assistance for Colcrys through [facility] 03/13/2019-06/11/2019. Informed the order was in progress with the pharmacy and a 90 day supply will be shipped to her o Her application for assistance for Bystolic through [place] [phone]) is still under review. Representative recommended calling back on 10/16 for status. . [name] reports that she has just run out of her [name] and is unable to afford to pick up her refill today. o Advise patient about the Immediate Supply program from [employer], which provides patients who are at risk of running out or rationing their insulin with a one-time free supply, filled through their pharmacy. Provide [name] with the phone number for this program, [phone], as well as instructions for enrolling online. Patient to contact program and pick up [place] today.  Patient Self Care Activities:  . Patient will provide necessary portions of application   Please see past updates related to this goal by clicking on the "Past Updates" button in the selected goal         Plan  The care management team will reach out to the patient again over the next 7 days.   [name], PharmD, [place] [employer] [phone]

## 2019-03-23 NOTE — Patient Instructions (Signed)
Thank you allowing the Chronic Care Management Team to be a part of your care! It was a pleasure speaking with you today!     CCM (Chronic Care Management) Team    Janci Minor RN, BSN Nurse Care Coordinator  [phone]   Harlow Asa PharmD  Clinical Pharmacist  [phone]   Eula Fried LCSW Clinical Social Worker [phone]  Visit Information  Goals Addressed            This Visit's Progress   . PharmD - "I'm in the donut hole" (pt-stated)       Current Barriers:  . Financial Barriers: patient has [place] MedicareRx Preferred Part D insurance and reports copay for Tresiba, Humalog, Bystolic and Colcrys is cost prohibitive at this time o Currently in the coverage gap of her coverage  Pharmacist Clinical Goal(s):  Marland Kitchen Over the next 30 days, patient will work with PharmD and providers to relieve medication access concerns  Interventions: . Follow up with Ms. Oleson regarding status of patient assistance applications. Advise patient: o She was approved for assistance for Tresiba from [employer] through 05/11/2019 and to receive a 90 day supply, shipped to office in 10-14 days. o She was approved for assistance for Humalog through [place] 03/16/2019-06/11/2019 and to receive a 4 month supply to be shipped to her home in 10-30 days.  o She was approved for assistance for Colcrys through [facility] 03/13/2019-06/11/2019. Informed the order was in progress with the pharmacy and a 90 day supply will be shipped to her o Her application for assistance for Bystolic through [place] [phone]) is still under review. Representative recommended calling back on 10/16 for status. . Ms. Egle reports that she has just run out of her [name] and is unable to afford to pick up her refill today. o Advise patient about the Immediate Supply program from [employer], which provides patients who are at risk of running out or rationing their insulin with a one-time free supply,  filled through their pharmacy. Provide Ms. Milk with the phone number for this program, [phone], as well as instructions for enrolling online. Patient to contact program and pick up [place] today.  Patient Self Care Activities:  . Patient will provide necessary portions of application   Please see past updates related to this goal by clicking on the "Past Updates" button in the selected goal         The patient verbalized understanding of instructions provided today and declined a print copy of patient instruction materials.   The care management team will reach out to the patient again over the next 7 days.   Harlow Asa, PharmD, [place] [employer] [phone]

## 2019-03-24 ENCOUNTER — Telehealth: Payer: Self-pay

## 2019-03-31 ENCOUNTER — Ambulatory Visit: Payer: Medicare Other | Admitting: Pharmacist

## 2019-03-31 DIAGNOSIS — E1140 Type 2 diabetes mellitus with diabetic neuropathy, unspecified: Secondary | ICD-10-CM

## 2019-03-31 NOTE — Patient Instructions (Signed)
Thank you allowing the Chronic Care Management Team to be a part of your care! It was a pleasure speaking with you today!     CCM (Chronic Care Management) Team    Janci Minor RN, BSN Nurse Care Coordinator  [phone]   Harlow Asa PharmD  Clinical Pharmacist  [phone]   Eula Fried LCSW Clinical Social Worker [phone]  Visit Information  Goals Addressed            This Visit's Progress   . PharmD - "I'm in the donut hole" (pt-stated)       Current Barriers:  . Financial Barriers: patient has [place] MedicareRx Preferred Part D insurance and reports copay for Tresiba, Humalog, Bystolic and Colcrys is cost prohibitive at this time o Currently in the coverage gap of her coverage  Pharmacist Clinical Goal(s):  Marland Kitchen Over the next 30 days, patient will work with PharmD and providers to relieve medication access concerns  Interventions: . Follow up with Ms. Seaver regarding adherence to [name] o Ms. Knott confirms picked up [place] from her pharmacy, as covered through [employer], on the day that we last spoke o Reports that since, has also received the 90 day supply of Tresiba 200 units/mL pens that arrived to the office from the patient assistance program . Follow up with Ms. Divito regarding status of other patient assistance applications. Advise patient: o She was approved for assistance for Humalog through [place] 03/16/2019-06/11/2019 and to receive a 4 month supply to be shipped to her home  - Provide patient with phone number to call to follow up o She was approved for assistance for Colcrys through [facility] 03/13/2019-06/11/2019. Informed the order was in progress with the pharmacy and a 90 day supply to be shipped to her - Provide patient with phone number to call to follow up o Her application for assistance for Bystolic through [place] [phone]) was still under review. Representative recommended calling back  after 10/16 for status. - Provide patient with phone number to call to follow up . Encourage patient to follow up with Medicare and [facility]) for [employer] plan advice. Provide phone number.  Patient Self Care Activities:  . Patient will provide necessary portions of application   Please see past updates related to this goal by clicking on the "Past Updates" button in the selected goal         The patient verbalized understanding of instructions provided today and declined a print copy of patient instruction materials.   Telephone follow up appointment with care management team member scheduled for: 11/10 at [place], PharmD, [place] [phone]

## 2019-03-31 NOTE — Chronic Care Management (AMB) (Signed)
Chronic Care Management   Follow Up Note   03/31/2019 Name: Elzabeth Minteer MRN: [id number] DOB: 1945-12-13  Referred by: Mikey College, NP Reason for referral : Chronic Care Management (Patient Phone Call)   Careena Alvidrez is a 73 y.o. year old female who is a primary care patient of Mikey College, NP. The CCM team was consulted for assistance with chronic disease management and care coordination needs.  Ms. Schomburg has a past medical history including but not limited to hypertension, lymphedema, type 2 diabetes mellitus and GERD.  I reached out to [name] by phone today.   Review of patient status, including review of consultants reports, relevant laboratory and other test results, and collaboration with appropriate care team members and the patient's provider was performed as part of comprehensive patient evaluation and provision of chronic care management services.    Outpatient Encounter Medications as of 03/31/2019  Medication Sig  . Insulin Degludec (TRESIBA FLEXTOUCH) 200 UNIT/ML SOPN Inject 56 Units into the skin daily.  Marland Kitchen acetaminophen (TYLENOL) 500 MG tablet Take 500 mg by mouth daily as needed.  . ARTIFICIAL TEAR OP Apply to eye.  . BD INSULIN SYRINGE U/F 31G X 5/16" 1 ML MISC USE AS DIRECTED. WITH HUMALOG  . Cholecalciferol (VITAMIN D3) 25 MCG (1000 UT) CAPS Take 4 capsules by mouth daily.   . clindamycin (CLEOCIN) 300 MG capsule Take 600 mg by mouth. 1 Hour before dental procedures  . COLCRYS 0.6 MG tablet Take as instructed every 4 hours as needed for acute gout flare up to 3 days (Patient not taking: Reported on 03/02/2019)  . cyclobenzaprine (FLEXERIL) 5 MG tablet TAKE 1 TABLET (5 MG TOTAL) BY MOUTH ONCE DAILY AS NEEDED FOR MUSCLE SPASMS  . famotidine (PEPCID) 20 MG tablet Take 20 mg by mouth 2 (two) times daily.  Marland Kitchen glucose blood (FREESTYLE LITE) test strip USE AS DIRECTED THREE TIMES DAILY FOR DIABETES  . hydrochlorothiazide  (HYDRODIURIL) 12.5 MG tablet Take 1 tablet (12.5 mg total) by mouth daily. (Patient taking differently: Take 12.5 mg by mouth daily. PRN per cardiologist.)  . insulin lispro (HUMALOG) 100 UNIT/ML injection Correction scale: take 1 unit per 50 over 150 before meals up to three times daily.  Up to 20 units per day.  . Insulin Pen Needle (PEN NEEDLES) 32G X 5 MM MISC 1 Device by Does not apply route daily.  Marland Kitchen lactase (LACTAID) 3000 units tablet Take by mouth as needed.   . Lancets (FREESTYLE) lancets Must test x4/day  . nebivolol (BYSTOLIC) 10 MG tablet Take 2.5 tablets (25 mg total) by mouth daily. (Patient taking differently: Take 5 mg by mouth. Patient only taking 5mg . Prn as cardiology)  . Nutritional Supplements (NUTRITIONAL SUPPLEMENT PO) Take by mouth. "Blood Boost Formula" by [employer]  . prednisoLONE acetate (PRED FORTE) 1 % ophthalmic suspension INSTILL ONE DROP  as needed  . rosuvastatin (CRESTOR) 10 MG tablet Take 10 mg by mouth daily.    No facility-administered encounter medications on file as of 03/31/2019.     Goals Addressed            This Visit's Progress   . PharmD - "I'm in the donut hole" (pt-stated)       Current Barriers:  . Financial Barriers: patient has [place] MedicareRx Preferred Part D insurance and reports copay for Tresiba, Humalog, Bystolic and Colcrys is cost prohibitive at this time o Currently in the coverage gap of her coverage  Pharmacist  Clinical Goal(s):  Marland Kitchen Over the next 30 days, patient will work with PharmD and providers to relieve medication access concerns  Interventions: . Follow up with Ms. Hauth regarding adherence to [name] o Ms. Shurtz confirms picked up [place] from her pharmacy, as covered through [employer], on the day that we last spoke o Reports that since, has also received the 90 day supply of Tresiba 200 units/mL pens that arrived to the office from the patient assistance program . Follow up  with Ms. Berges regarding status of other patient assistance applications. Advise patient: o She was approved for assistance for Humalog through [place] 03/16/2019-06/11/2019 and to receive a 4 month supply to be shipped to her home  - Provide patient with phone number to call to follow up o She was approved for assistance for Colcrys through [facility] 03/13/2019-06/11/2019. Informed the order was in progress with the pharmacy and a 90 day supply to be shipped to her - Provide patient with phone number to call to follow up o Her application for assistance for Bystolic through [place] [phone]) was still under review. Representative recommended calling back after 10/16 for status. - Provide patient with phone number to call to follow up . Encourage patient to follow up with Medicare and [facility]) for [employer] plan advice. Provide phone number. o Ms. Dimeglio expresses concern about whether she has the best part D plan for her.   Patient Self Care Activities:  . Patient will provide necessary portions of application   Please see past updates related to this goal by clicking on the "Past Updates" button in the selected goal         Plan  Telephone follow up appointment with care management team member scheduled for: 11/10 at [place], PharmD, [place] [phone]

## 2019-04-17 ENCOUNTER — Other Ambulatory Visit: Payer: Self-pay | Admitting: Family Medicine

## 2019-04-17 DIAGNOSIS — E7800 Pure hypercholesterolemia, unspecified: Secondary | ICD-10-CM

## 2019-04-21 ENCOUNTER — Ambulatory Visit: Payer: Self-pay | Admitting: Pharmacist

## 2019-04-21 ENCOUNTER — Telehealth: Payer: Self-pay

## 2019-04-21 NOTE — Chronic Care Management (AMB) (Signed)
  Chronic Care Management   Follow Up Note   04/21/2019 Name: [name] MRN: [id number] DOB: Mar 25, 1946  Referred by: Mikey College, NP (Inactive) Reason for referral : Chronic Care Management (Patient Phone Call)   [name] is a 73 y.o. year old female who is a primary care patient of Mikey College, NP (Inactive). The CCM team was consulted for assistance with chronic disease management and care coordination needs. [name] has a past medical history including but not limited to hypertension, lymphedema, type 2 diabetes mellitus and GERD.  Was unable to reach patient via telephone today and have left HIPAA compliant voicemail asking patient to return my call.    Plan  The care management team will reach out to the patient again over the next 14 days.   Harlow Asa, PharmD, [place] [employer] [phone]

## 2019-04-22 ENCOUNTER — Ambulatory Visit: Payer: Self-pay | Admitting: Pharmacist

## 2019-04-22 DIAGNOSIS — E1140 Type 2 diabetes mellitus with diabetic neuropathy, unspecified: Secondary | ICD-10-CM

## 2019-04-22 NOTE — Chronic Care Management (AMB) (Signed)
Chronic Care Management   Follow Up Note   04/22/2019 Name: Zaryn Wheatcraft MRN: [id number] DOB: 14-Mar-1946  Referred by: Mikey College, NP (Inactive) Reason for referral : Chronic Care Management (Patient Phone Call) and [place] is a 73 y.o. year old female who is a primary care patient of Mikey College, NP (Inactive). The CCM team was consulted for assistance with chronic disease management and care coordination needs.  Ms. Karwacki has a past medical history including but not limited to hypertension, lymphedema, type 2 diabetes mellitus and GERD.  Receive a voicemail from Ms. Delia Chimes requesting a call back  I reached out to [name] by phone today to follow up regarding medication assistance.  Review of patient status, including review of consultants reports, relevant laboratory and other test results, and collaboration with appropriate care team members and the patient's provider was performed as part of comprehensive patient evaluation and provision of chronic care management services.     Outpatient Encounter Medications as of 04/22/2019  Medication Sig  . acetaminophen (TYLENOL) 500 MG tablet Take 500 mg by mouth daily as needed.  . ARTIFICIAL TEAR OP Apply to eye.  . BD INSULIN SYRINGE U/F 31G X 5/16" 1 ML MISC USE AS DIRECTED. WITH HUMALOG  . Cholecalciferol (VITAMIN D3) 25 MCG (1000 UT) CAPS Take 4 capsules by mouth daily.   . clindamycin (CLEOCIN) 300 MG capsule Take 600 mg by mouth. 1 Hour before dental procedures  . COLCRYS 0.6 MG tablet Take as instructed every 4 hours as needed for acute gout flare up to 3 days (Patient not taking: Reported on 03/02/2019)  . cyclobenzaprine (FLEXERIL) 5 MG tablet TAKE 1 TABLET (5 MG TOTAL) BY MOUTH ONCE DAILY AS NEEDED FOR MUSCLE SPASMS  . famotidine (PEPCID) 20 MG tablet Take 20 mg by mouth 2 (two) times daily.  Marland Kitchen glucose blood (FREESTYLE LITE) test strip USE AS DIRECTED THREE  TIMES DAILY FOR DIABETES  . hydrochlorothiazide (HYDRODIURIL) 12.5 MG tablet Take 1 tablet (12.5 mg total) by mouth daily. (Patient taking differently: Take 12.5 mg by mouth daily. PRN per cardiologist.)  . Insulin Degludec (TRESIBA FLEXTOUCH) 200 UNIT/ML SOPN Inject 56 Units into the skin daily.  . insulin lispro (HUMALOG) 100 UNIT/ML injection Correction scale: take 1 unit per 50 over 150 before meals up to three times daily.  Up to 20 units per day.  . Insulin Pen Needle (PEN NEEDLES) 32G X 5 MM MISC 1 Device by Does not apply route daily.  Marland Kitchen lactase (LACTAID) 3000 units tablet Take by mouth as needed.   . Lancets (FREESTYLE) lancets Must test x4/day  . nebivolol (BYSTOLIC) 10 MG tablet Take 2.5 tablets (25 mg total) by mouth daily. (Patient taking differently: Take 5 mg by mouth. Patient only taking 5mg . Prn as cardiology)  . Nutritional Supplements (NUTRITIONAL SUPPLEMENT PO) Take by mouth. "Blood Boost Formula" by [employer]  . prednisoLONE acetate (PRED FORTE) 1 % ophthalmic suspension INSTILL ONE DROP  as needed  . rosuvastatin (CRESTOR) 10 MG tablet TAKE 1 TABLET BY MOUTH EVERY DAY   No facility-administered encounter medications on file as of 04/22/2019.     Goals Addressed            This Visit's Progress   . PharmD - "I'm in the donut hole" (pt-stated)       Current Barriers:  . Financial Barriers: patient has [place] MedicareRx Preferred Part D insurance and reports copay for  [name], Humalog, Bystolic and Colcrys is cost prohibitive at this time o Currently in the coverage gap of her coverage  Pharmacist Clinical Goal(s):  Marland Kitchen Over the next 30 days, patient will work with PharmD and providers to relieve medication access concerns  Interventions: . Follow up with Ms. Sprandel regarding status of patient assistance applications for [id number] calendar year.  o Reports that she did follow up with [employer]- was informed that the 4 month supply of Humalog to be shipped to her home,  but has not yet received supply - Advise patient to call to follow up o Confirms she received 90 day supply of Colcrys through [employer] her application for assistance for Bystolic through [place] was denied based on her copayment amount - Review option of discussing a change of medication to generic option within class with provider. Patient states due her history of drug allergies and reactions, she does not wish to try switch from [place] . Discuss medication assistance options for 2021 calendar year. Ms. Fritchie would like assistance with applying for: o Patient Assistance Program for Tresiba through [place] Patient Assistance Program for Humalog through [place] Patient Assistance Program for [employer] through [employer] with office staff to request that above applications be printed and patient notified to pick up.  Patient to provide proof of income and complete and sign application application.  Will collaborate with prescriber for their portion of application. Once completed, will submit to respective patient assistance programs. . Again encourage patient to follow up with Medicare and [facility]) for Medicare plan advice. Provide phone number. o Ms. Surratt expresses concern about whether she has the best part D plan for her.   Patient Self Care Activities:  . Patient will provide necessary portions of application   Please see past updates related to this goal by clicking on the "Past Updates" button in the selected goal         Plan  The care management team will reach out to the patient again over the next 30 days.   Harlow Asa, PharmD, [place] [employer] [phone]

## 2019-04-22 NOTE — Patient Instructions (Signed)
Thank you allowing the Chronic Care Management Team to be a part of your care! It was a pleasure speaking with you today!     CCM (Chronic Care Management) Team    Janci Minor RN, BSN Nurse Care Coordinator  [phone]   Harlow Asa PharmD  Clinical Pharmacist  [phone]   Eula Fried LCSW Clinical Social Worker [phone]  Visit Information  Goals Addressed            This Visit's Progress   . PharmD - "I'm in the donut hole" (pt-stated)       Current Barriers:  . Financial Barriers: patient has [place] [employer] Preferred Part D insurance and reports copay for Tresiba, Humalog, Bystolic and Colcrys is cost prohibitive at this time o Currently in the coverage gap of her coverage  Pharmacist Clinical Goal(s):  Marland Kitchen Over the next 30 days, patient will work with PharmD and providers to relieve medication access concerns  Interventions: . Follow up with Ms. Bieber regarding status of patient assistance applications for [id number] calendar year.  o Reports that she did follow up with [employer]- was informed that the 4 month supply of Humalog to be shipped to her home, but has not yet received supply - Advise patient to call to follow up o Confirms she received 90 day supply of Colcrys through [employer] her application for assistance for Bystolic through [place] was denied based on her copayment amount - Review option of discussing a change of medication to generic option within class with provider. Patient states due her history of drug allergies and reactions, she does not wish to try switch from [place] . Discuss medication assistance options for 2021 calendar year. Ms. Costea would like assistance with applying for: o Patient Assistance Program for Tresiba through [place] Patient Assistance Program for Humalog through [place] Patient Assistance Program for [employer] through [employer] with office staff to request that above  applications be printed and patient notified to pick up.  Patient to provide proof of income and complete and sign application application.  Will collaborate with prescriber for their portion of application. Once completed, will submit to respective patient assistance programs. . Again encourage patient to follow up with [facility] and [facility]) for Medicare plan advice. Provide phone number. o Ms. Zingale expresses concern about whether she has the best part D plan for her.   Patient Self Care Activities:  . Patient will provide necessary portions of application   Please see past updates related to this goal by clicking on the "Past Updates" button in the selected goal         The patient verbalized understanding of instructions provided today and declined a print copy of patient instruction materials.   The care management team will reach out to the patient again over the next 30 days.   Harlow Asa, PharmD, [place] [employer] [phone]

## 2019-04-23 ENCOUNTER — Telehealth: Payer: Self-pay

## 2019-04-23 NOTE — Telephone Encounter (Signed)
Patient called reporting that [facility] never received a rx for her Humalog.  To give a verbal:  [phone]  To fax: [phone]

## 2019-04-23 NOTE — Telephone Encounter (Signed)
I called the patient and left a message on her voicemail.

## 2019-04-24 NOTE — Telephone Encounter (Signed)
Patient has called [name] back.

## 2019-04-24 NOTE — Telephone Encounter (Signed)
The pt was notified that the Humalog was called in to [facility]. She verbalize understanding, no questions or concerns

## 2019-04-27 ENCOUNTER — Telehealth: Payer: Self-pay

## 2019-05-06 ENCOUNTER — Telehealth: Payer: Self-pay

## 2019-05-13 ENCOUNTER — Telehealth: Payer: Self-pay | Admitting: Cardiovascular Disease

## 2019-05-13 ENCOUNTER — Ambulatory Visit (INDEPENDENT_AMBULATORY_CARE_PROVIDER_SITE_OTHER): Payer: Medicare Other | Admitting: Pharmacist

## 2019-05-13 DIAGNOSIS — M109 Gout, unspecified: Secondary | ICD-10-CM

## 2019-05-13 DIAGNOSIS — E1140 Type 2 diabetes mellitus with diabetic neuropathy, unspecified: Secondary | ICD-10-CM | POA: Diagnosis not present

## 2019-05-13 DIAGNOSIS — I10 Essential (primary) hypertension: Secondary | ICD-10-CM | POA: Diagnosis not present

## 2019-05-13 DIAGNOSIS — E119 Type 2 diabetes mellitus without complications: Secondary | ICD-10-CM | POA: Diagnosis not present

## 2019-05-13 LAB — HM DIABETES EYE EXAM

## 2019-05-13 NOTE — Telephone Encounter (Signed)
Spoke with [name] pharmacist and they are needing clarification on medication. 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 Pharmacy reports that it is prescribed for gout flares and not pericarditis. She wanted to know if you want patient to continue Colcrys for pericarditis and if so then what dosing and parameters would you apply to that. They are just trying to correct her instructions and currently the script is every 4 hours as needed for acute gout flare up to 3 days. Advised I would send to provider and be in touch once I hear back from him. She was apprecaitive for our help with no further questions at this time. Let her know that it would probably be Monday or Tuesday and she requested that I call between 1-5. Will reach back out to her once you review and provide recommendations.

## 2019-05-13 NOTE — Patient Instructions (Signed)
Thank you allowing the Chronic Care Management Team to be a part of your care! It was a pleasure speaking with you today!     CCM (Chronic Care Management) Team    Janci Minor RN, BSN Nurse Care Coordinator  [phone]   Harlow Asa PharmD  Clinical Pharmacist  [phone]   Eula Fried LCSW Clinical Social Worker [phone]  Visit Information  Goals Addressed            This Visit's Progress   . PharmD - "I'm in the donut hole" (pt-stated)       Current Barriers:  . Financial Barriers: patient has [place] [employer] Preferred Part D insurance and reports copay for Tresiba, Humalog, Bystolic and Colcrys is cost prohibitive at this time o Currently in the coverage gap of her coverage  Pharmacist Clinical Goal(s):  Marland Kitchen Over the next 30 days, patient will work with PharmD and providers to relieve medication access concerns  Interventions:  Received completed patient portion of medication assistance applicatoins for [id number] calendar year for ? Patient Assistance Program for [place] through [employer] ? Patient Assistance Program for Humalog through [employer] ? Patient Assistance Program for [place] through [place]  . Collaborated with patient's PCP regarding patient assistance applications and need for clarification about directions for patient's colchicine prescription . Coordination of care to patient's Cardiologist for medication reconciliation among providers regarding directions for patient's colchicine prescription. o Speak with [name], RN. Request clarification from Dr. Rockey Situ about whether patient should continue to use colchicine for pericarditis and, if so, with what direction/parameters. . Follow up call to patient o Ms. Vittone reports that she has still not received the Humalog from the 2020 patient assistance application, despite confirmation from [facility] on 10/9 that patient had been approved for this assistance o Ms. Grisanti confirms currently  has a supply of Humalog . Place coordination of care call to [facility], dispensing pharmacy for [employer]. Speak with RPh Barrett, who reports that the medication was shipped yesterday and should be delivered today o Follow up call to Ms. Delia Chimes with this information  Patient Self Care Activities:  . Patient will provide necessary portions of application  . Patient will attend medical appointments as scheduled o Office appointment scheduled with Dr. Parks Ranger for 05/18/2019  Please see past updates related to this goal by clicking on the "Past Updates" button in the selected goal         The patient verbalized understanding of instructions provided today and declined a print copy of patient instruction materials.   The care management team will reach out to the patient again over the next 10 days.   Harlow Asa, PharmD, [place] [employer] [phone]

## 2019-05-13 NOTE — Telephone Encounter (Signed)
Pt c/o medication issue:  1. Name of Medication:  Colchicine  2. How are you currently taking this medication (dosage and times per day)? This is the question   3. Are you having a reaction (difficulty breathing--STAT)? Na   4. What is your medication issue?  Please call to discuss

## 2019-05-13 NOTE — Chronic Care Management (AMB) (Signed)
Chronic Care Management   Follow Up Note   05/13/2019 Name: [name] MRN: [id number] DOB: Sep 22, 1945  Referred by: Mikey College, NP (Inactive) Reason for referral : Care Coordination (Cardiologist)   Raneka Hamburg is a 73 y.o. year old female who is a primary care patient of Mikey College, NP (Inactive). The CCM team was consulted for assistance with chronic disease management and care coordination needs.  Ms. Loebig has a past medical history including but not limited to hypertension, lymphedema, type 2 diabetes mellitus and GERD.  Place coordination of care call to patient's Cardiologist office today.  Speak with Ms. Donaway today.  Coordination of care call to [facility].  Review of patient status, including review of consultants reports, relevant laboratory and other test results, and collaboration with appropriate care team members and the patient's provider was performed as part of comprehensive patient evaluation and provision of chronic care management services.    Outpatient Encounter Medications as of 05/13/2019  Medication Sig  . acetaminophen (TYLENOL) 500 MG tablet Take 500 mg by mouth daily as needed.  . ARTIFICIAL TEAR OP Apply to eye.  . BD INSULIN SYRINGE U/F 31G X 5/16" 1 ML MISC USE AS DIRECTED. WITH HUMALOG  . Cholecalciferol (VITAMIN D3) 25 MCG (1000 UT) CAPS Take 4 capsules by mouth daily.   . clindamycin (CLEOCIN) 300 MG capsule Take 600 mg by mouth. 1 Hour before dental procedures  . COLCRYS 0.6 MG tablet Take as instructed every 4 hours as needed for acute gout flare up to 3 days (Patient not taking: Reported on 03/02/2019)  . cyclobenzaprine (FLEXERIL) 5 MG tablet TAKE 1 TABLET (5 MG TOTAL) BY MOUTH ONCE DAILY AS NEEDED FOR MUSCLE SPASMS  . famotidine (PEPCID) 20 MG tablet Take 20 mg by mouth 2 (two) times daily.  Marland Kitchen glucose blood (FREESTYLE LITE) test strip USE AS DIRECTED THREE TIMES DAILY FOR DIABETES  .  hydrochlorothiazide (HYDRODIURIL) 12.5 MG tablet Take 1 tablet (12.5 mg total) by mouth daily. (Patient taking differently: Take 12.5 mg by mouth daily. PRN per cardiologist.)  . Insulin Degludec (TRESIBA FLEXTOUCH) 200 UNIT/ML SOPN Inject 56 Units into the skin daily.  . insulin lispro (HUMALOG) 100 UNIT/ML injection Correction scale: take 1 unit per 50 over 150 before meals up to three times daily.  Up to 20 units per day.  . Insulin Pen Needle (PEN NEEDLES) 32G X 5 MM MISC 1 Device by Does not apply route daily.  Marland Kitchen lactase (LACTAID) 3000 units tablet Take by mouth as needed.   . Lancets (FREESTYLE) lancets Must test x4/day  . nebivolol (BYSTOLIC) 10 MG tablet Take 2.5 tablets (25 mg total) by mouth daily. (Patient taking differently: Take 5 mg by mouth. Patient only taking 5mg . Prn as cardiology)  . Nutritional Supplements (NUTRITIONAL SUPPLEMENT PO) Take by mouth. "Blood Boost Formula" by [employer]  . prednisoLONE acetate (PRED FORTE) 1 % ophthalmic suspension INSTILL ONE DROP  as needed  . rosuvastatin (CRESTOR) 10 MG tablet TAKE 1 TABLET BY MOUTH EVERY DAY   No facility-administered encounter medications on file as of 05/13/2019.     Goals Addressed            This Visit's Progress   . PharmD - "I'm in the donut hole" (pt-stated)       Current Barriers:  . Financial Barriers: patient has [place] MedicareRx Preferred Part D insurance and reports copay for Tresiba, Humalog, Bystolic and Colcrys is cost prohibitive at  this time o Currently in the coverage gap of her coverage  Pharmacist Clinical Goal(s):  Marland Kitchen Over the next 30 days, patient will work with PharmD and providers to relieve medication access concerns  Interventions:  Received completed patient portion of medication assistance applicatoins for [id number] calendar year for ? Patient Assistance Program for [place] through [employer] ? Patient Assistance Program for Humalog through [employer] ? Patient Assistance Program  for [place] through [place]  . Collaborated with patient's PCP regarding patient assistance applications and need for clarification about directions for patient's colchicine prescription o Patient reports that she uses colchicine as needed as directed by previous providers (including Cardiologist) in [place] for inflammation related to pericarditis, gout and other inflammation. . Coordination of care to patient's Cardiologist for medication reconciliation among providers regarding directions for patient's colchicine prescription. o Speak with [name], RN. Request clarification from Dr. Rockey Situ about whether patient should continue to use colchicine for pericarditis and, if so, with what direction/parameters. Pam advises that Dr. Rockey Situ is out of office and to expect a response on Monday or Tuesday. . Follow up call to patient o Ms. Noguez reports that she has still not received the Humalog from the 2020 patient assistance application, despite confirmation from [facility] on 10/9 that patient had been approved for this assistance o Note CMA Donnie Mesa refaxed prescription as requested by dispensing pharmacy on 11/12 o Ms. Munsch confirms currently has a supply of Humalog . Place coordination of care call to [facility], dispensing pharmacy for [employer]. Speak with RPh Barrett, who reports that the medication was shipped yesterday and should be delivered today o Follow up call to Ms. Delia Chimes with this information  Patient Self Care Activities:  . Patient will provide necessary portions of application  . Patient will attend medical appointments as scheduled o Office appointment scheduled with Dr. Parks Ranger for 05/18/2019  Please see past updates related to this goal by clicking on the "Past Updates" button in the selected goal         Plan  The care management team will reach out to the patient again over the next 10 days.   Harlow Asa, PharmD, [place] [employer] [phone]

## 2019-05-15 ENCOUNTER — Ambulatory Visit: Payer: Medicare Other | Admitting: Nurse Practitioner

## 2019-05-17 NOTE — Telephone Encounter (Signed)
We can change script colcrys 0.6 BID for 3 days prn for pericarditis episode, #60, no refills

## 2019-05-18 ENCOUNTER — Encounter: Payer: Self-pay | Admitting: Family Medicine

## 2019-05-18 ENCOUNTER — Ambulatory Visit (INDEPENDENT_AMBULATORY_CARE_PROVIDER_SITE_OTHER): Payer: Medicare Other | Admitting: Family Medicine

## 2019-05-18 ENCOUNTER — Telehealth: Payer: Self-pay | Admitting: Cardiovascular Disease

## 2019-05-18 ENCOUNTER — Other Ambulatory Visit: Payer: Self-pay

## 2019-05-18 ENCOUNTER — Ambulatory Visit: Payer: Self-pay | Admitting: Pharmacist

## 2019-05-18 VITALS — BP 159/74 | HR 93 | Temp 97.5°F | Resp 16 | Ht 69.0 in | Wt 242.0 lb

## 2019-05-18 DIAGNOSIS — I319 Disease of pericardium, unspecified: Secondary | ICD-10-CM

## 2019-05-18 DIAGNOSIS — M359 Systemic involvement of connective tissue, unspecified: Secondary | ICD-10-CM

## 2019-05-18 DIAGNOSIS — M150 Primary generalized (osteo)arthritis: Secondary | ICD-10-CM

## 2019-05-18 DIAGNOSIS — M109 Gout, unspecified: Secondary | ICD-10-CM | POA: Diagnosis not present

## 2019-05-18 DIAGNOSIS — E1140 Type 2 diabetes mellitus with diabetic neuropathy, unspecified: Secondary | ICD-10-CM | POA: Diagnosis not present

## 2019-05-18 DIAGNOSIS — M8949 Other hypertrophic osteoarthropathy, multiple sites: Secondary | ICD-10-CM | POA: Diagnosis not present

## 2019-05-18 DIAGNOSIS — N183 Chronic kidney disease, stage 3 unspecified: Secondary | ICD-10-CM | POA: Diagnosis not present

## 2019-05-18 DIAGNOSIS — I129 Hypertensive chronic kidney disease with stage 1 through stage 4 chronic kidney disease, or unspecified chronic kidney disease: Secondary | ICD-10-CM | POA: Diagnosis not present

## 2019-05-18 DIAGNOSIS — M159 Polyosteoarthritis, unspecified: Secondary | ICD-10-CM

## 2019-05-18 LAB — POCT GLYCOSYLATED HEMOGLOBIN (HGB A1C): Hemoglobin A1C: 8.2 % — AB (ref 4.0–5.6)

## 2019-05-18 LAB — POCT UA - MICROALBUMIN: Microalbumin Ur, POC: 0 mg/L

## 2019-05-18 MED ORDER — COLCRYS 0.6 MG PO TABS: 0.6000 mg | ORAL_TABLET | Freq: Two times a day (BID) | ORAL | 0 refills | Status: DC | PRN

## 2019-05-18 MED ORDER — COLCRYS 0.6 MG PO TABS: 0.6000 mg | ORAL_TABLET | Freq: Two times a day (BID) | ORAL | 2 refills | Status: DC | PRN

## 2019-05-18 NOTE — Patient Instructions (Addendum)
Thank you for coming to the office today.  Referral to [facility] Rheumatology - stay tuned for apt.  Recent Labs    08/22/18 1347 02/10/19 1053 05/18/19 1529  HGBA1C 7.5* 7.6* 8.2*    Keep improving lifestyle and diet  - continue current insulin regimen.   We re ordered Colcrys instructions now should be take 1 twice a day for 3 days for pericarditis or up to 7 days for gout or other connective tissue disorder. Should not take daily.   Please schedule a Follow-up Appointment to: Return in about 3 months (around 08/16/2019) for DM A1c with new provider.  If you have any other questions or concerns, please feel free to call the office or send a message through [place]. You may also schedule an earlier appointment if necessary.  Additionally, you may be receiving a survey about your experience at our office within a few days to 1 week by e-mail or mail. We value your feedback.  Nobie Putnam, DO [place]

## 2019-05-18 NOTE — Telephone Encounter (Signed)
Left voicemail message for both patient and pharmacist to call back regarding medication.

## 2019-05-18 NOTE — Telephone Encounter (Signed)
Pt c/o medication issue:  1. Name of Medication:  colcrys 0.6 mg po BID prn / as directed   2. How are you currently taking this medication (dosage and times per day)?   3. Are you having a reaction (difficulty breathing--STAT)?   4. What is your medication issue? Pharmacy calling to see why [facility] sent rx for brand name only when patient previously taking generic if so please provide the reason

## 2019-05-18 NOTE — Chronic Care Management (AMB) (Signed)
Chronic Care Management   Follow Up Note   05/18/2019 Name: [name] MRN: [id number] DOB: Mar 19, 1946  Referred by: Mikey College, NP (Inactive) Reason for referral : Care Coordination [facility] Cardiology)   [name] is a 73 y.o. year old female who is a primary care patient of Mikey College, NP (Inactive). The CCM team was consulted for assistance with chronic disease management and care coordination needs.  [name] has a past medical history including but not limited to hypertension, lymphedema, type 2 diabetes mellitus and GERD.  Coordination of care call to patient's Cardiologist office today.  Review of patient status, including review of consultants reports, relevant laboratory and other test results, and collaboration with appropriate care team members and the patient's provider was performed as part of comprehensive patient evaluation and provision of chronic care management services.     Outpatient Encounter Medications as of 05/18/2019  Medication Sig  . acetaminophen (TYLENOL) 500 MG tablet Take 500 mg by mouth daily as needed.  . ARTIFICIAL TEAR OP Apply to eye.  . BD INSULIN SYRINGE U/F 31G X 5/16" 1 ML MISC USE AS DIRECTED. WITH HUMALOG  . Cholecalciferol (VITAMIN D3) 25 MCG (1000 UT) CAPS Take 4 capsules by mouth daily.   . clindamycin (CLEOCIN) 300 MG capsule Take 600 mg by mouth. 1 Hour before dental procedures  . COLCRYS 0.6 MG tablet Take 1 tablet (0.6 mg total) by mouth 2 (two) times daily as needed (Twice daily for 3 days as needed for pericarditis episode). Take as instructed every 4 hours as needed for acute gout flare up to 3 days  . cyclobenzaprine (FLEXERIL) 5 MG tablet TAKE 1 TABLET (5 MG TOTAL) BY MOUTH ONCE DAILY AS NEEDED FOR MUSCLE SPASMS  . famotidine (PEPCID) 20 MG tablet Take 20 mg by mouth 2 (two) times daily.  Marland Kitchen glucose blood (FREESTYLE LITE) test strip USE AS DIRECTED THREE TIMES DAILY FOR DIABETES  .  hydrochlorothiazide (HYDRODIURIL) 12.5 MG tablet Take 1 tablet (12.5 mg total) by mouth daily. (Patient taking differently: Take 12.5 mg by mouth daily. PRN per cardiologist.)  . Insulin Degludec (TRESIBA FLEXTOUCH) 200 UNIT/ML SOPN Inject 56 Units into the skin daily.  . insulin lispro (HUMALOG) 100 UNIT/ML injection Correction scale: take 1 unit per 50 over 150 before meals up to three times daily.  Up to 20 units per day.  . Insulin Pen Needle (PEN NEEDLES) 32G X 5 MM MISC 1 Device by Does not apply route daily.  Marland Kitchen lactase (LACTAID) 3000 units tablet Take by mouth as needed.   . Lancets (FREESTYLE) lancets Must test x4/day  . nebivolol (BYSTOLIC) 10 MG tablet Take 2.5 tablets (25 mg total) by mouth daily. (Patient taking differently: Take 5 mg by mouth. Patient only taking 5mg . Prn as cardiology)  . Nutritional Supplements (NUTRITIONAL SUPPLEMENT PO) Take by mouth. "Blood Boost Formula" by [employer]  . prednisoLONE acetate (PRED FORTE) 1 % ophthalmic suspension INSTILL ONE DROP  as needed  . rosuvastatin (CRESTOR) 10 MG tablet TAKE 1 TABLET BY MOUTH EVERY DAY   No facility-administered encounter medications on file as of 05/18/2019.     Goals Addressed            This Visit's Progress   . PharmD - "I'm in the donut hole" (pt-stated)       Current Barriers:  . Financial Barriers: patient has [place] MedicareRx Preferred Part D insurance and reports copay for Tresiba, Humalog, Bystolic and Colcrys  is cost prohibitive at this time o Currently in the coverage gap of her coverage  Pharmacist Clinical Goal(s):  Marland Kitchen Over the next 30 days, patient will work with PharmD and providers to relieve medication access concerns  Interventions: . Further coordination of care patient's Cardiologist for medication reconciliation among providers regarding directions for patient's colchicine prescription. o Speak with [name], RN who advises that per Dr. Rockey Situ, patient should use the following dosing:  colcrys 0.6 BID for 3 days prn for pericarditis episode o Note patient has reported also using colchicine for gout and other inflammation. . Collaborate with Dr. Parks Ranger, who patient is scheduled to see for office visit today.  Patient Self Care Activities:  . Patient will provide necessary portions of application  . Patient will attend medical appointments as scheduled  Please see past updates related to this goal by clicking on the "Past Updates" button in the selected goal         Plan  The care management team will reach out to the patient again over the next 14 days.   Harlow Asa, PharmD, [place] [employer] [phone]

## 2019-05-18 NOTE — Telephone Encounter (Signed)
Spoke with [name] pharmacist and reviewed further clarification of medication for pericarditis and she verbalized understanding. 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 Advised that I would call [name] back to make her aware of different dosages. Reviewed with patient and [name] pharmacist that we are dosing only for pericarditis and that if she needs this medication for other disorders which she mentioned gout and other cramping disorders she would need to see PCP for further instructions for those reasons. She is enroute to her appointment with provider and pharmacist [name] will provide Dr. Raliegh Ip with this information as well.

## 2019-05-18 NOTE — Telephone Encounter (Signed)
Patient returning call.

## 2019-05-18 NOTE — Progress Notes (Signed)
Subjective:    Patient ID: [name], female    DOB: February 28, 1946, 73 y.o.   MRN: [id number]  [name] is a 73 y.o. female presenting on 05/18/2019 for Diabetes   HPI   CHRONIC DM, Type 2 neuropathy Reports concerns was off insulin for period of time, she is awaiting insulin shipment from [place] to get back on track. A1c 7.5 to 7.6 range, now due for A1c today admits her sugars avg higher CBGs: higher on avg Meds: Tresiba 56u daily, Humalog  SSI TID w Reports good compliance. Tolerating well w/o side-effects Currently not on ACEi-ARB - due for urine microalbumin Lifestyle: - Diet (improving diet)  - Needs DM Foot exam today. She has chronic neuropathy in feet. Admits lymphedema in both lower extremity Denies hypoglycemia, polyuria, visual changes, numbness or tingling.  CHRONIC HTN: Reports no new concerns, followed by Dr Rockey Situ. She has known labile BP, difficult to control. Has issues with low blood pressure at times. Current Meds - HCTZ 71m    Reports good compliance, took meds today. Tolerating well, w/o complaints. 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 history of unidentified connective tissue disorder - had prior work-up years ago, but inconclusive. She describes concerns with possible vasculitis or lupus in past. She takes colchicine 0.6 BID PRN for this, and needed clarification on order instructions today. - She has not seen Rheumatologist in long time or locally in [place]- History of Sjogren's also reported Elevated ESR and CRP 02/2019   Hair Loss She has had some frontal hair loss, has taken Bioitin, asking today if 130mdose daily is appropriate.  Health Maintenance: Mammogram updated 12/29/18 - had [place]  cystic bi raids 3, next due repeat in 6 months. Does not take Flu Vaccine.  [facility] 05/18/2019 02/12/2019 01/02/2019  Decreased Interest 0 0 0  Down, Depressed, Hopeless 0 0 0  PHQ - 2 Score 0 0 0    Social History   Tobacco Use  . Smoking status: Former Smoker    Quit date: 01/22/1996    Years since quitting: 23.3  . Smokeless tobacco: Never Used  Substance Use Topics  . Alcohol use: Yes    Comment: occasional  . Drug use: Never    Review of Systems Per HPI unless specifically indicated above     Objective:    BP (!) 159/74   Pulse 93   Temp (!) 97.5 F (36.4 C) (Oral)   Resp 16   Ht _0  (1.753 m)   Wt 242 lb (109.8 kg)   BMI 35.74 kg/m   Wt Readings from Last 3 Encounters:  05/18/19 242 lb (109.8 kg)  02/24/19 237 lb (107.5 kg)  02/12/19 238 lb (108 kg)    Physical Exam Vitals signs and nursing note reviewed.  Constitutional:      General: She is not in acute distress.    Appearance: She is well-developed. She is not diaphoretic.     Comments: Well-appearing, comfortable, cooperative  HENT:     Head: Normocephalic and atraumatic.  Eyes:     General:        Right eye: No discharge.        Left eye: No discharge.     Conjunctiva/sclera: Conjunctivae normal.  Cardiovascular:  Rate and Rhythm: Normal rate.  Pulmonary:     Effort: Pulmonary effort is normal.  Musculoskeletal:     Right lower leg: Edema (+2 pitting) present.     Left lower leg: Edema (L>R lymphedema +2 pitting) present.  Skin:    General: Skin is warm and dry.     Findings: No erythema or rash.  Neurological:     Mental Status: She is alert and oriented to person, place, and time.  Psychiatric:        Behavior: Behavior normal.     Comments: Well groomed, good eye contact, normal speech and thoughts       Recent Labs    08/22/18 1347 02/10/19 1053 05/18/19 1529  HGBA1C 7.5* 7.6* 8.2*    Diabetic Foot Exam - Simple   Simple Foot Form Diabetic Foot exam  was performed with the following findings: Yes 05/18/2019  3:39 PM  Visual Inspection See comments: Yes Sensation Testing See comments: Yes Pulse Check Posterior Tibialis and Dorsalis pulse intact bilaterally: Yes Comments Bilateral lower extremity lymphedema. Some callus formation. L>R. No ulceration. Reduced monofilament sensation Left great toe and toes.      Results for orders placed or performed in visit on 05/18/19  POCT HgB A1C  Result Value Ref Range   Hemoglobin A1C 8.2 (A) 4.0 - 5.6 %  [facility] - POCT UA - Microalbumin  Result Value Ref Range   Microalbumin Ur, POC 0 mg/L   Creatinine, POC     Albumin/Creatinine Ratio, Urine, POC       Chemistry      Component Value Date/Time   NA 141 02/10/2019 1053   K 5.0 02/10/2019 1053   CL 104 02/10/2019 1053   CO2 29 02/10/2019 1053   BUN 13 02/10/2019 1053   CREATININE 0.99 (H) 02/10/2019 1053      Component Value Date/Time   CALCIUM 9.4 02/10/2019 1053   ALKPHOS 88 12/22/2018 1638   AST 16 02/10/2019 1053   ALT 15 02/10/2019 1053   BILITOT 0.5 02/10/2019 1053     02/10/19 - CRP - 8.4 (elevated) 02/10/19 - Sed rate - 67 (elevated)     Assessment & Plan:   Problem List Items Addressed This Visit    Undifferentiated connective tissue disease (Benjamin Perez)   Relevant Orders   Ambulatory referral to Rheumatology   Type 2 diabetes, controlled, with neuropathy ([place])   Relevant Orders   POCT HgB A1C (Completed)   [facility] - POCT UA - Microalbumin (Completed)   Pericarditis   Relevant Medications   COLCRYS 0.6 MG tablet   Other Relevant Orders   Ambulatory referral to Rheumatology   Osteoarthritis   Relevant Medications   COLCRYS 0.6 MG tablet   Gouty arthritis   Relevant Medications   COLCRYS 0.6 MG tablet   Other Relevant Orders   Ambulatory referral to Rheumatology   Benign hypertension with CKD (chronic kidney disease) stage III - Primary      #HTN CKD-III Chronic elevated / labile BP Managed by Cardiology, on  thiazide with suboptimal results, has issues with low BP in past. Can be related to pain and other symptoms of acute flare with inflammatory conditions Discussed course, but agree no change today - Last lab mild elevated Creatinine 0.99 but actually this is improved from previous readings to 1.03 in past, this is about average for her kidney function - will defer repeat lab until next due.  #Type 2 Diabetes, w/ neuropathy Chronic problem, previously better controlled in  A1c 7 range, now today A1c up to 8.2, off insulin for period of time, has new shipment from company on assistance now will get back on track with insulin management - Also followed by [place] for med assistance - DM Foot exam today - Urine microalbumin today 0, negative Continue on Insulin regimen, titrate as advised Follow-up within 3 months, consider other agents in future if indicated  #Chronic Inflammatory Conditions / Connective Tissue Disorder / Recurrent Pericarditis / Gout Complex history, reviewed some of background on these conditions on chart and by her report, some of this is not available from out of state physician's at [facility]. - Recent testing ESR CRP elevated - Change Colchicine to reflect her usages 0.42m BID PRN (up to 3 days for pericarditis, and up to 7 days for gout other connective tissue concern) new rx sent - Agree to pursue further evaluation and diagnostics - [facility] [place]  . Ambulatory referral to Rheumatology    Referral Priority:   Routine    Referral Type:   Consultation    Referral Reason:   Specialty Services Required    Requested Specialty:   Rheumatology    Number of Visits Requested:   1  . POCT HgB A1C  . [place]- POCT UA - Microalbumin     Meds ordered this encounter  Medications  . COLCRYS 0.6 MG tablet    Sig: Take 1 tablet (0.6 mg total) by mouth 2 (two) times daily as needed. For up to 3 days  for pericarditis, or up to 7 days for gout or connective tissue disorder.    Dispense:  60 tablet    Refill:  2     Follow up plan: Return in about 3 months (around 08/16/2019) for DM A1c with new provider.   ANobie Putnam [facility] 05/18/2019, 3:13 PM

## 2019-05-19 ENCOUNTER — Telehealth: Payer: Self-pay

## 2019-05-19 NOTE — Telephone Encounter (Signed)
Returned call to pharmacy to confirm that generic would be acceptable for colcrys rx.  No further questions or orders at this time.

## 2019-05-21 ENCOUNTER — Ambulatory Visit: Payer: Medicare Other | Admitting: Pharmacist

## 2019-05-21 DIAGNOSIS — I10 Essential (primary) hypertension: Secondary | ICD-10-CM | POA: Diagnosis not present

## 2019-05-21 DIAGNOSIS — E1140 Type 2 diabetes mellitus with diabetic neuropathy, unspecified: Secondary | ICD-10-CM | POA: Diagnosis not present

## 2019-05-22 NOTE — Chronic Care Management (AMB) (Signed)
Chronic Care Management   Follow Up Note   05/21/2019 Name: Jnaya Magley MRN: [id number] DOB: 02-10-1946  Referred by: Mikey College, NP (Inactive) Reason for referral : No chief complaint on file.   Belma Daddario is a 73 y.o. year old female who is a primary care patient of Mikey College, NP (Inactive). The CCM team was consulted for assistance with chronic disease management and care coordination needs.   Ms. Bertola has a past medical history including but not limited to hypertension, lymphedema, type 2 diabetes mellitus and GERD.  Receive a voicemail from Ms. Delia Chimes requesting a call back  I reached out to [name] by phone today to follow up regarding medication assistance.  Review of patient status, including review of consultants reports, relevant laboratory and other test results, and collaboration with appropriate care team members and the patient's provider was performed as part of comprehensive patient evaluation and provision of chronic care management services.     Outpatient Encounter Medications as of 05/21/2019  Medication Sig  . acetaminophen (TYLENOL) 500 MG tablet Take 500 mg by mouth daily as needed.  . ARTIFICIAL TEAR OP Apply to eye.  . BD INSULIN SYRINGE U/F 31G X 5/16" 1 ML MISC USE AS DIRECTED. WITH HUMALOG  . Cholecalciferol (VITAMIN D3) 25 MCG (1000 UT) CAPS Take 4 capsules by mouth daily.   . clindamycin (CLEOCIN) 300 MG capsule Take 600 mg by mouth. 1 Hour before dental procedures  . COLCRYS 0.6 MG tablet Take 1 tablet (0.6 mg total) by mouth 2 (two) times daily as needed. For up to 3 days for pericarditis, or up to 7 days for gout or connective tissue disorder.  . cyclobenzaprine (FLEXERIL) 5 MG tablet TAKE 1 TABLET (5 MG TOTAL) BY MOUTH ONCE DAILY AS NEEDED FOR MUSCLE SPASMS  . famotidine (PEPCID) 20 MG tablet Take 20 mg by mouth 2 (two) times daily.  Marland Kitchen glucose blood (FREESTYLE LITE) test strip USE AS DIRECTED  THREE TIMES DAILY FOR DIABETES  . hydrochlorothiazide (HYDRODIURIL) 12.5 MG tablet Take 1 tablet (12.5 mg total) by mouth daily. (Patient taking differently: Take 12.5 mg by mouth daily. PRN per cardiologist.)  . Insulin Degludec (TRESIBA FLEXTOUCH) 200 UNIT/ML SOPN Inject 56 Units into the skin daily.  . insulin lispro (HUMALOG) 100 UNIT/ML injection Correction scale: take 1 unit per 50 over 150 before meals up to three times daily.  Up to 20 units per day.  . Insulin Pen Needle (PEN NEEDLES) 32G X 5 MM MISC 1 Device by Does not apply route daily.  Marland Kitchen lactase (LACTAID) 3000 units tablet Take by mouth as needed.   . Lancets (FREESTYLE) lancets Must test x4/day  . nebivolol (BYSTOLIC) 10 MG tablet Take 2.5 tablets (25 mg total) by mouth daily. (Patient taking differently: Take 5 mg by mouth. Patient only taking 5mg . Prn as cardiology)  . Nutritional Supplements (NUTRITIONAL SUPPLEMENT PO) Take by mouth. "Blood Boost Formula" by [employer]  . prednisoLONE acetate (PRED FORTE) 1 % ophthalmic suspension INSTILL ONE DROP  as needed  . rosuvastatin (CRESTOR) 10 MG tablet TAKE 1 TABLET BY MOUTH EVERY DAY   No facility-administered encounter medications on file as of 05/21/2019.    Goals Addressed            This Visit's Progress   . PharmD - "I'm in the donut hole" (pt-stated)       Current Barriers:  . Financial Barriers: patient has [place] [employer] Preferred Part  D insurance and reports copay for Tresiba, Humalog, Bystolic and Colcrys is cost prohibitive at this time o Currently in the coverage gap of her coverage  Pharmacist Clinical Goal(s):  Marland Kitchen Over the next 30 days, patient will work with PharmD and providers to relieve medication access concerns  Interventions: . Follow up regarding medication assistance for 2021 calendar year ? Note I have received completed patient portion of medication assistance applicatoins for:  ? Patient Assistance Program for [place] through [employer] ? Patient Assistance Program for Humalog through [employer] ? Patient Assistance Program for Colcrys through [place]  ? Note patient's application for assistance for Bystolic through [place] was denied in 2020 ? Patient previously denied interest in a change of medication to generic option within class due her history of drug allergies and reactions ? States going to follow up with Cardiologist, Dr. Rockey Situ, about recommendations for alternative to Bystolic ? Advise patient that [employer] application cannot be submitted until 2021 . Ms. Debarros confirms that she received shipped Humalog from [place] patient assistance program ton Wednesday. Marland Kitchen Collaborate with Asa Lente, CMA in office, and send provider portion of patient assistance applications for Humalog and Colcrys to Dr. Parks Ranger  Patient Self Care Activities:  . Patient will provide necessary portions of application  . Patient will attend medical appointments as scheduled  Please see past updates related to this goal by clicking on the "Past Updates" button in the selected goal         Plan  The care management team will reach out to the patient again over the next 30 days.   Harlow Asa, PharmD, [place] [employer] [phone]

## 2019-05-22 NOTE — Patient Instructions (Addendum)
Thank you allowing the Chronic Care Management Team to be a part of your care! It was a pleasure speaking with you today!     CCM (Chronic Care Management) Team    Janci Minor RN, BSN Nurse Care Coordinator  [phone]   [name] PharmD  Clinical Pharmacist  [phone]   Eula Fried LCSW Clinical Social Worker [phone]  Visit Information  Goals Addressed            This Visit's Progress   . PharmD - "I'm in the donut hole" (pt-stated)       Current Barriers:  . Financial Barriers: patient has [place] MedicareRx Preferred Part D insurance and reports copay for Tresiba, Humalog, Bystolic and Colcrys is cost prohibitive at this time o Currently in the coverage gap of her coverage  Pharmacist Clinical Goal(s):  Marland Kitchen Over the next 30 days, patient will work with PharmD and providers to relieve medication access concerns  Interventions: . Follow up regarding medication assistance for 2021 calendar year ? Note I have received completed patient portion of medication assistance applicatoins for:  ? Patient Assistance Program for [place] through [employer] ? Patient Assistance Program for Humalog through [employer] ? Patient Assistance Program for Colcrys through [employer]  ? Note patient's application for assistance for Bystolic through [place] was denied in 2020 ? Patient previously denied interest in a change of medication to generic option within class due her history of drug allergies and reactions ? States going to follow up with Cardiologist, Dr. Rockey Situ, about recommendations for alternative to Bystolic ? Advise patient that [employer] application cannot be submitted until 2021 . [name] confirms that she received shipped Humalog from [place] patient assistance program ton Wednesday. Marland Kitchen Collaborate with Watts Lente, CMA in office, and send provider portion of patient assistance applications for Humalog and Colcrys to Dr. Parks Ranger  Patient Self Care  Activities:  . Patient will provide necessary portions of application  . Patient will attend medical appointments as scheduled  Please see past updates related to this goal by clicking on the "Past Updates" button in the selected goal         The patient verbalized understanding of instructions provided today and declined a print copy of patient instruction materials.   The care management team will reach out to the patient again over the next 30 days.   [name], PharmD, [place] [employer] [phone]

## 2019-05-24 ENCOUNTER — Other Ambulatory Visit: Payer: Self-pay | Admitting: Nurse Practitioner

## 2019-05-24 DIAGNOSIS — R1011 Right upper quadrant pain: Secondary | ICD-10-CM

## 2019-05-25 ENCOUNTER — Telehealth: Payer: Self-pay | Admitting: Cardiovascular Disease

## 2019-05-25 NOTE — Telephone Encounter (Signed)
Spoke with patient and she has many blood pressure problems and PCP was managing but she is no longer available. She reports elevated blood pressures and scheduled her for appointment this week and reviewed signs and symptoms to monitor for that would require immediate evaluation in the ED. Instructed her to keep a log of her blood pressures and bring into that appointment. She verbalized understanding with no further questions at this time.

## 2019-05-25 NOTE — Telephone Encounter (Signed)
Spoke with patient and reviewed that it looks like her medications are prescribed by her PCP office.

## 2019-05-25 NOTE — Telephone Encounter (Signed)
Pt c/o BP issue: STAT if pt c/o blurred vision, one-sided weakness or slurred speech  1. What are your last 5 BP readings?  12/14 175/96 12/13 205/110  2. Are you having any other symptoms (ex. Dizziness, headache, blurred vision, passed out)? Headaches, doesn't "feel right", unsteady but not dizzy. Also has not been urinating and unsure if it could be kidney related  3. What is your BP issue? bp has been elevated

## 2019-05-27 ENCOUNTER — Ambulatory Visit: Payer: Self-pay | Admitting: Pharmacist

## 2019-05-27 DIAGNOSIS — E1140 Type 2 diabetes mellitus with diabetic neuropathy, unspecified: Secondary | ICD-10-CM

## 2019-05-27 NOTE — Chronic Care Management (AMB) (Signed)
Chronic Care Management   Follow Up Note   05/27/2019 Name: Nykiah Rothermel MRN: [id number] DOB: 05/17/1946  Referred by: Mikey College, NP (Inactive) Reason for referral : Chronic Care Management (Patient Phone Call) and Care Coordination (Pharmacy)   [employer] Prowant is a 73 y.o. year old female who is a primary care patient of Mikey College, NP (Inactive). The CCM team was consulted for assistance with chronic disease management and care coordination needs.   Ms. Zahradka has a past medical history including but not limited to hypertension, lymphedema, type 2 diabetes mellitus and GERD.  Receive a voicemail from Ms. Delia Chimes requesting a call back  I reached out to [name] by phone today   Coordination of care calls to [facility] and [facility].   Review of patient status, including review of consultants reports, relevant laboratory and other test results, and collaboration with appropriate care team members and the patient's provider was performed as part of comprehensive patient evaluation and provision of chronic care management services.     Outpatient Encounter Medications as of 05/27/2019  Medication Sig  . acetaminophen (TYLENOL) 500 MG tablet Take 500 mg by mouth daily as needed.  . ARTIFICIAL TEAR OP Apply to eye.  . BD INSULIN SYRINGE U/F 31G X 5/16" 1 ML MISC USE AS DIRECTED. WITH HUMALOG  . Cholecalciferol (VITAMIN D3) 25 MCG (1000 UT) CAPS Take 4 capsules by mouth daily.   . clindamycin (CLEOCIN) 300 MG capsule Take 600 mg by mouth. 1 Hour before dental procedures  . COLCRYS 0.6 MG tablet Take 1 tablet (0.6 mg total) by mouth 2 (two) times daily as needed. For up to 3 days for pericarditis, or up to 7 days for gout or connective tissue disorder.  . cyclobenzaprine (FLEXERIL) 5 MG tablet TAKE 1 TABLET (5 MG TOTAL) BY MOUTH ONCE DAILY AS NEEDED FOR MUSCLE SPASMS  . famotidine (PEPCID) 20 MG tablet Take 20 mg by mouth 2 (two) times  daily.  Marland Kitchen glucose blood (FREESTYLE LITE) test strip USE AS DIRECTED THREE TIMES DAILY FOR DIABETES  . hydrochlorothiazide (HYDRODIURIL) 12.5 MG tablet Take 1 tablet (12.5 mg total) by mouth daily. (Patient taking differently: Take 12.5 mg by mouth daily. PRN per cardiologist.)  . Insulin Degludec (TRESIBA FLEXTOUCH) 200 UNIT/ML SOPN Inject 56 Units into the skin daily.  . insulin lispro (HUMALOG) 100 UNIT/ML injection Correction scale: take 1 unit per 50 over 150 before meals up to three times daily.  Up to 20 units per day.  . Insulin Pen Needle (PEN NEEDLES) 32G X 5 MM MISC 1 Device by Does not apply route daily.  Marland Kitchen lactase (LACTAID) 3000 units tablet Take by mouth as needed.   . Lancets (FREESTYLE) lancets Must test x4/day  . nebivolol (BYSTOLIC) 10 MG tablet Take 2.5 tablets (25 mg total) by mouth daily. (Patient taking differently: Take 5 mg by mouth. Patient only taking 5mg . Prn as cardiology)  . Nutritional Supplements (NUTRITIONAL SUPPLEMENT PO) Take by mouth. "Blood Boost Formula" by [employer]  . prednisoLONE acetate (PRED FORTE) 1 % ophthalmic suspension INSTILL ONE DROP  as needed  . rosuvastatin (CRESTOR) 10 MG tablet TAKE 1 TABLET BY MOUTH EVERY DAY   No facility-administered encounter medications on file as of 05/27/2019.    Goals Addressed            This Visit's Progress   . PharmD - "I'm in the donut hole" (pt-stated)       Current  Barriers:  . Financial Barriers: patient has [place] [employer]Rx Preferred Part D insurance and reports copay for Tresiba, Humalog, Bystolic and Colcrys is cost prohibitive at this time o Currently in the coverage gap of her coverage  Pharmacist Clinical Goal(s):  Marland Kitchen Over the next 30 days, patient will work with PharmD and providers to relieve medication access concerns  Interventions: . Receive voicemail from patient requesting assistance with obtaining refill of 31-gauge BD Insulin Syringes. Reports she has tried to pick this up from  [facility], but needs a new prescription ? Perform chart review and note that syringe prescription last called into [facility] by PCP . Coordination of care call to [facility]. Pharmacist Sam confirms prescription has refills remaining . Coordination of care call to [facility]. Speak with Wille Glaser, who confirms pharmacy will transfer prescription from [place] and fill for patient . Ms. Khang reports that she has not been feeling well. Per recent telephone note in chart, patient has spoken with Cardiology office about recent home BP results and concern for recent COVID-19 exposure  ? Patient states she is planning to receive COVID-19 test from [facility] unless feeling better tomorrow ? States that she will follow back up with Cardiology for further concerns about BP and once she has COVID-19 results  Patient Self Care Activities:  . Patient will provide necessary portions of application  . Patient will attend medical appointments as scheduled  Please see past updates related to this goal by clicking on the "Past Updates" button in the selected goal         Plan  The care management team will reach out to the patient again over the next 30 days.   Harlow Asa, PharmD, [place] [employer] [phone]

## 2019-05-27 NOTE — Telephone Encounter (Signed)
Spoke with patient and she reports just not feeling well. She feels like she is coming down with something. Blood pressure this morning was 136/80 at 11:30AM. She has been exposed to COVID and advised that she should check with her PCP or urgent care if she is not feeling good. Requested that she keep appointment for now but if she does get COVID test then let us know so we can determine if appointment is still needed. She verbalized understanding, was agreeable to keep scheduled appointment for now, and will certainly call us if she is checked out for COVID.

## 2019-05-27 NOTE — Telephone Encounter (Signed)
Patient states her BP has came down 145/88. Patient cancelled her appointment for tomorrow.

## 2019-05-27 NOTE — Patient Instructions (Signed)
Thank you allowing the Chronic Care Management Team to be a part of your care! It was a pleasure speaking with you today!     CCM (Chronic Care Management) Team    Janci Minor RN, BSN Nurse Care Coordinator  [phone]   Harlow Asa PharmD  Clinical Pharmacist  [phone]   Eula Fried LCSW Clinical Social Worker [phone]  Visit Information  Goals Addressed            This Visit's Progress   . PharmD - "I'm in the donut hole" (pt-stated)       Current Barriers:  . Financial Barriers: patient has [place] [employer] insurance and reports copay for Tresiba, Humalog, Bystolic and Colcrys is cost prohibitive at this time o Currently in the coverage gap of her coverage  Pharmacist Clinical Goal(s):  Marland Kitchen Over the next 30 days, patient will work with PharmD and providers to relieve medication access concerns  Interventions: . Receive voicemail from patient requesting assistance with obtaining refill of 31-gauge BD Insulin Syringes. Reports she has tried to pick this up from [facility], but needs a new prescription ? Perform chart review and note that syringe prescription last called into [facility] by PCP . Coordination of care call to [facility]. Pharmacist Sam confirms prescription has refills remaining . Coordination of care call to [facility]. Speak with Wille Glaser, who confirms pharmacy will transfer prescription from [place] and fill for patient . Ms. Hamric reports that she has not been feeling well. Per recent telephone note in chart, patient has spoken with Cardiology office about recent home BP results and concern for recent COVID-19 exposure  ? Patient states she is planning to receive COVID-19 test from [facility] unless feeling better tomorrow ? States that she will follow back up with Cardiology for further concerns about BP and once she has COVID-19 results  Patient Self Care Activities:  . Patient will provide  necessary portions of application  . Patient will attend medical appointments as scheduled  Please see past updates related to this goal by clicking on the "Past Updates" button in the selected goal         The patient verbalized understanding of instructions provided today and declined a print copy of patient instruction materials.   The care management team will reach out to the patient again over the next 30 days.   Harlow Asa, PharmD, [place] [employer] [phone]

## 2019-05-28 ENCOUNTER — Other Ambulatory Visit: Payer: Self-pay

## 2019-05-28 ENCOUNTER — Ambulatory Visit
Admission: EM | Admit: 2019-05-28 | Discharge: 2019-05-28 | Disposition: A | Discharge status: Home / Self Care | Payer: Medicare Other | Attending: Family Medicine | Admitting: Family Medicine

## 2019-05-28 ENCOUNTER — Ambulatory Visit: Payer: PRIVATE HEALTH INSURANCE | Admitting: Nurse Practitioner

## 2019-05-28 ENCOUNTER — Encounter: Payer: Self-pay | Admitting: Emergency Medicine

## 2019-05-28 DIAGNOSIS — Z20828 Contact with and (suspected) exposure to other viral communicable diseases: Secondary | ICD-10-CM | POA: Insufficient documentation

## 2019-05-28 DIAGNOSIS — Z20822 Contact with and (suspected) exposure to covid-19: Secondary | ICD-10-CM

## 2019-05-28 DIAGNOSIS — I10 Essential (primary) hypertension: Secondary | ICD-10-CM | POA: Diagnosis present

## 2019-05-28 LAB — URINALYSIS, COMPLETE (UACMP) WITH MICROSCOPIC
Glucose, UA: 100 mg/dL — AB
Hgb urine dipstick: NEGATIVE
Ketones, ur: NEGATIVE mg/dL
Leukocytes,Ua: NEGATIVE
Nitrite: NEGATIVE
Protein, ur: NEGATIVE mg/dL
Specific Gravity, Urine: 1.025 (ref 1.005–1.030)
pH: 5 (ref 5.0–8.0)

## 2019-05-28 NOTE — Discharge Instructions (Signed)
It was very nice seeing you today in clinic. Thank you for entrusting me with your care.   Rest and stay hydrated. Follow up with cardiology and PCP.   You were tested for SARS-CoV-2 (novel coronavirus) today. Testing is performed by an outside lab ([facility]) and has variable turn around times ranging between 2-5 days. Current recommendations from the the CDC and [place] DHHS require that you remain out of work in order to quarantine at home until negative test results are have been received. In the event that your test results are positive, you will be contacted with further directives. These measures are being implemented out of an abundance of caution to prevent transmission and spread during the current SARS-CoV-2 pandemic.  Make arrangements to follow up with your regular doctor in 1 week for re-evaluation if not improving. If your symptoms/condition worsens, please seek follow up care either here or in the ER. Please remember, our [place] providers are "right here with you" when you need [place].   Again, it was my pleasure to take care of you today. Thank you for choosing our clinic. I hope that you start to feel better quickly.   Honor Loh, MSN, APRN, FNP-C, CEN Advanced Practice Provider [facility]

## 2019-05-28 NOTE — ED Triage Notes (Addendum)
Pt c/o elevated BP, weakness, headache and lower back pain. Started about a week ago. She states her balance to the left is off but denies dizziness. Per pt her bp had been running 200's/100's but has been ok today.

## 2019-05-29 LAB — NOVEL CORONAVIRUS, NAA (HOSP ORDER, SEND-OUT TO REF LAB; TAT 18-24 HRS): SARS-CoV-2, NAA: NOT DETECTED

## 2019-05-29 NOTE — ED Provider Notes (Addendum)
[place], [place]   Name: [name] DOB: 01-Oct-1945 MRN: [id number] CSN: [id number] PCP: Mikey College, NP (Inactive)  Arrival date and time:  05/28/19 1626  Chief Complaint:  Hypertension   NOTE: Prior to seeing the patient today, I have reviewed the triage nursing documentation and vital signs. Clinical staff has updated patient's PMH/PSHx, current medication list, and drug allergies/intolerances to ensure comprehensive history available to assist in medical decision making.   History:   HPI: [name] is a 73 y.o. female who presents today with complaints of weakness, fatigue, cough, balance issues, back and elevated blood pressures that started about a week ago. Cough has been non-productive. She shortness of breath or wheezing. She has been feeling off balance, however adamantly denies being dizzy or lightheaded. She has had intermittent non-specific headaches. Patient has a complicated medical history; last seen by her PCP on 05/18/2019. Patient states, "I have been feeling icky poo for the last week. All I have been doing is sleeping". She denies any fevers or associated urinary symptoms; no dysuria, frequency, urgency, or gross hematuria. Patient denies nausea, vomiting, changes to her bowel habits, and abdominal pain. Blood pressures have been as high as 200/100s at home. She denies associated headaches, chest pain, shortness of breath, and palpitations. She is followed by cardiology Rockey Situ, MD) and has been in contact with him about her recent blood pressure elevations. She has a history of labile pressures. Patient has an appointment scheduled to see her cardiologist soon, however she was advised to come to urgent care for SARS-CoV-2 (novel coronavirus) testing in preparation for F2F visit. Presenting blood pressure is 132/84 today in clinic. Patient denies being in close contact with anyone known to be ill. She has not been tested for SARS-CoV-2 (novel  coronavirus) in the last 14 days; last tested negative in 12/2018 per her report.   Past Medical History:  Diagnosis Date  . Anterolisthesis    Cervical spine  . Asthma   . Connective tissue disorder (HCC)    recurrent carotid arteritis, temporal arteritis, vasculitis mandible, general hepatitis, avascular necrosis bilat  . DDD (degenerative disc disease), lumbar   . Diabetes mellitus without complication ([place])   . Diverticulosis   . Diverticulosis   . Duodenitis   . Gouty arthritis   . Hiatal hernia with GERD   . Hyperlipidemia   . Hypertension   . Hyperuricemia   . Keratitis sicca, bilateral ([place])   . Lymphedema of both lower extremities   . Osteoarthritis   . Pericarditis    Recurrent: Aug 97, July 05, Sept 08, July 16, July 18  . PUD (peptic ulcer disease)   . 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[place])   . Sjogren's syndrome ([place])   . Sjogren's syndrome ([place])   . Syncope   . Tendonitis, Achilles, right   . Tortuous colon   . Trigger finger     Past Surgical History:  Procedure Laterality Date  . ABDOMINAL HYSTERECTOMY    . BREAST BIOPSY    . BREAST EXCISIONAL BIOPSY Left 1986  . CHOLECYSTECTOMY    . COLONOSCOPY WITH PROPOFOL N/A 08/26/2018   Procedure: COLONOSCOPY WITH PROPOFOL;  Surgeon: Lucilla Lame, MD;  Location: [facility] ENDOSCOPY;  Service: Endoscopy;  Laterality: N/A;  . CYSTOSCOPY  02/26/2018   [name], MD   . distal arthrectomy    . ESOPHAGOGASTRODUODENOSCOPY (EGD) WITH PROPOFOL N/A 08/26/2018   Procedure: ESOPHAGOGASTRODUODENOSCOPY (EGD) WITH PROPOFOL;  Surgeon: Lucilla Lame, MD;  Location[facility] ENDOSCOPY;  Service: Endoscopy;  Laterality: N/A;  . EXCISION NEUROMA    . KNEE SURGERY Bilateral    1985, 2001, 11/2002, 12/2006  . panhysterectomy  10/1983  . SHOULDER SURGERY Right    reverse total arthroplasty w/ biceps tenodesis  . TONSILLECTOMY AND ADENOIDECTOMY    . TOTAL HIP ARTHROPLASTY Right 04/2007  . WISDOM TOOTH EXTRACTION      Family History  Problem Relation Age  of Onset  . Breast cancer Neg Hx     Social History   Tobacco Use  . Smoking status: Former Smoker    Quit date: 01/22/1996    Years since quitting: 23.3  . Smokeless tobacco: Never Used  Substance Use Topics  . Alcohol use: Yes    Comment: occasional  . Drug use: Never    Patient Active Problem List   Diagnosis Date Noted  . Lymphedema 11/10/2018  . PAD (peripheral artery disease) ([place]) 10/12/2018  . Aortic atherosclerosis ([place]) 10/12/2018  . Carotid stenosis, asymptomatic, bilateral 10/12/2018  . Positive colorectal cancer screening using Cologuard test 10/09/2018  . Gastroesophageal reflux disease   . Acute gastritis without hemorrhage   . Osteoarthritis 04/29/2018  . Hiatal hernia with GERD 04/29/2018  . Hyperlipidemia 04/29/2018  . Lymphedema of both lower extremities 04/29/2018  . Left knee pain 10/23/2016  . Cataracts, bilateral 07/31/2016  . Glaucoma 07/31/2016  . Lateral epicondylitis of left elbow 10/20/2015  . Vitamin D deficiency 10/20/2015  . Type 2 diabetes, controlled, with neuropathy ([place]) 10/16/2015  . Gouty arthritis 06/03/2015  . H/O syncope 06/03/2015  . Mild intermittent asthma 06/03/2015  . Multiple gastric ulcers 06/03/2015  . Schatzki's ring 06/03/2015  . Undifferentiated connective tissue disease ([place]) 06/03/2015  . Bone disorder 04/05/2015  . Dental injury 04/05/2015  . Pericarditis 04/05/2015  . Sjogren's syndrome ([place]) 09/07/2014  . Elevated alkaline phosphatase level 08/24/2014  . Lactose intolerance 08/24/2014  . Obesity 08/24/2014  . Peripheral edema 08/24/2014  . 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 Abdominal adhesions 08/02/2014  . 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[place]) 08/02/2014  . Degenerative joint disease (DJD) of lumbar spine 08/02/2014  . Diverticulosis of both small and large intestine 08/02/2014  . Hyperuricemia 08/02/2014  . Pure hypercholesterolemia 08/02/2014  . Trigger  finger 08/02/2014  . Right shoulder pain 07/29/2014  . Benign neoplasm of colon 06/30/2010  . Right upper quadrant pain 06/30/2010    Home Medications:    Current Meds  Medication Sig  . acetaminophen (TYLENOL) 500 MG tablet Take 500 mg by mouth daily as needed.  . ARTIFICIAL TEAR OP Apply to eye.  . BD INSULIN SYRINGE U/F 31G X 5/16" 1 ML MISC USE AS DIRECTED. WITH HUMALOG  . Cholecalciferol (VITAMIN D3) 25 MCG (1000 UT) CAPS Take 4 capsules by mouth daily.   Marland Kitchen COLCRYS 0.6 MG tablet Take 1 tablet (0.6 mg total) by mouth 2 (two) times daily as needed. For up to 3 days for pericarditis, or up to 7 days for gout or connective tissue disorder.  . cyclobenzaprine (FLEXERIL) 5 MG tablet TAKE 1 TABLET (5 MG TOTAL) BY MOUTH ONCE DAILY AS NEEDED FOR MUSCLE SPASMS  . famotidine (PEPCID) 20 MG tablet Take 20 mg by mouth 2 (two) times daily.  Marland Kitchen glucose blood (FREESTYLE LITE) test strip USE AS DIRECTED THREE TIMES DAILY FOR DIABETES  . hydrochlorothiazide (HYDRODIURIL) 12.5 MG tablet Take 1 tablet (12.5 mg total) by mouth daily. (Patient taking differently: Take 12.5 mg by mouth daily.  PRN per cardiologist.)  . Insulin Degludec (TRESIBA FLEXTOUCH) 200 UNIT/ML SOPN Inject 56 Units into the skin daily.  . insulin lispro (HUMALOG) 100 UNIT/ML injection Correction scale: take 1 unit per 50 over 150 before meals up to three times daily.  Up to 20 units per day.  . Insulin Pen Needle (PEN NEEDLES) 32G X 5 MM MISC 1 Device by Does not apply route daily.  Marland Kitchen lactase (LACTAID) 3000 units tablet Take by mouth as needed.   . Lancets (FREESTYLE) lancets Must test x4/day  . Nutritional Supplements (NUTRITIONAL SUPPLEMENT PO) Take by mouth. "Blood Boost Formula" by [employer]  . rosuvastatin (CRESTOR) 10 MG tablet TAKE 1 TABLET BY MOUTH EVERY DAY    Allergies:   Amlodipine, Aspirin, Bee venom, Ciprofloxacin, Codeine, Erythromycin, Glimepiride, Hydralazine, Hydrochlorothiazide, Hydrocodone,  Hydrocodone-acetaminophen, Hydromorphone, Irbesartan, Irbesartan-hydrochlorothiazide, Lisinopril, Maxitrol [neomycin-polymyxin-dexameth], Metformin and related, Metformin hcl, Methylcellulose, Metoclopramide, Metoprolol, Neomycin-bacitracin zn-polymyx, Norvasc [amlodipine besylate], Nsaids, Omeprazole magnesium, Other, Oxycodone-acetaminophen, Penicillins, Pioglitazone, Poison sumac extract, Prilosec [omeprazole], [place] sulfadiazine, Sulfa antibiotics, Tape, Tetanus toxoid, Tramadol, and Betadine [povidone iodine]  Review of Systems (ROS): Review of Systems  Constitutional: Positive for activity change (anergic; sleeping more) and fever. Negative for chills and fatigue.  HENT: Negative for congestion, ear pain, postnasal drip, rhinorrhea, sinus pressure, sinus pain, sneezing and sore throat.   Eyes: Negative for pain, discharge and redness.  Respiratory: Positive for cough. Negative for chest tightness and shortness of breath.   Cardiovascular: Negative for chest pain and palpitations.       Elevated BPs  Gastrointestinal: Negative for abdominal pain, diarrhea, nausea and vomiting.  Genitourinary: Negative for dysuria, flank pain, hematuria and urgency.  Musculoskeletal: Positive for back pain. Negative for arthralgias, myalgias and neck pain.  Skin: Negative for color change, pallor and rash.  Neurological: Positive for weakness (generalized) and headaches. Negative for dizziness and syncope.  Hematological: Negative for adenopathy.  Psychiatric/Behavioral: Positive for sleep disturbance (hypersonolence). The patient is nervous/anxious.      Vital Signs: Today's Vitals   05/28/19 1648 05/28/19 1650 05/28/19 1656 05/28/19 1806  BP:   132/84   Pulse:   93   Resp:   18   Temp:   98.3 F (36.8 C)   TempSrc:   Oral   SpO2:   98%   Weight:  235 lb (106.6 kg)    Height:  5\' 9"  (1.753 m)    PainSc: 1    1     Physical Exam: Physical Exam  Constitutional: She is oriented to person,  place, and time and well-developed, well-nourished, and in no distress.  HENT:  Head: Normocephalic and atraumatic.  Eyes: Pupils are equal, round, and reactive to light.  Cardiovascular: Normal rate, regular rhythm, normal heart sounds and intact distal pulses.  BP 132/84; no CP, SOB, palpitations.   Pulmonary/Chest: Effort normal and breath sounds normal.  Musculoskeletal:        General: Edema (2+ pitting to BLE) present. Normal range of motion.  Neurological: She is alert and oriented to person, place, and time. She has normal sensation and intact cranial nerves. Gait normal.  Able to change position from chair to standing with no difficulties. Ambulation, mentation, and speech normal.   Skin: Skin is warm and dry. No rash noted. She is not diaphoretic.  Psychiatric: Memory, affect and judgment normal. Her mood appears anxious.  Nursing note and vitals reviewed.   Urgent Care Treatments / Results:   LABS: PLEASE NOTE: all labs that were ordered this encounter  are listed, however only abnormal results are displayed. Labs Reviewed  URINALYSIS, COMPLETE (UACMP) WITH MICROSCOPIC - Abnormal; Notable for the following components:      Result Value   Glucose, UA 100 (*)    Bilirubin Urine SMALL (*)    Bacteria, UA RARE (*)    All other components within normal limits  NOVEL CORONAVIRUS, NAA (HOSP ORDER, SEND-OUT TO REF LAB; TAT 18-24 HRS)    EKG: -None  RADIOLOGY: No results found.  PROCEDURES: Procedures  MEDICATIONS RECEIVED THIS VISIT: Medications - No data to display  PERTINENT CLINICAL COURSE NOTES/UPDATES:   Initial Impression / Assessment and Plan / Urgent Care Course:  Pertinent labs & imaging results that were available during my care of the patient were personally reviewed by me and considered in my medical decision making (see lab/imaging section of note for values and interpretations).  [name] is a 73 y.o. female who presents to [facility]  today with multiple complaints (see HPI).   Patient is well appearing overall in clinic today. She does not appear to be in any acute distress. Presenting symptoms (see HPI) and exam as documented above. Blood pressure minimally elevated today at 132/84. Patient has been getting much higher readings at home using her electronic wrist meter. I am not sure her meter is accurate. Encouraged correlation assessment when seen by cardiology as they are managing her antihypertensives. Due to weakness and back pain, urine sample checked today. UA results not concerning for infection; 0-5 RBC/hpf, 0-5 WBC/hpf, no nitrites or LE. Discussed supportive care measures at home. Patient to rest as much as possible. She was encouraged to ensure adequate hydration. Patient may use APAP and/or IBU on an as needed basis for back pain and headache.    In the wake of the current SARS-CoV-2 (novel coronavirus) pandemic, her symptoms are certainly concerning for possible viral infection. Discussed typical symptom constellation. Reviewed potential for infection and need for testing. Patient amenable to being tested. SARS-CoV-2 swab collected by certified clinical staff. Discussed variable turn around times associated with testing, as swabs are being processed at [facility], and have been taking between 2-5 days to come back. She was advised to self quarantine, per [facility] DHHS guidelines, until negative results received. These measures are being implemented out of an abundance of caution to prevent transmission and spread during the current SARS-CoV-2 pandemic.  Discussed follow up with cardiology as already scheduled for re-assessment and ongoing management of her blood pressure. I have reviewed the follow up and strict return precautions for any new or worsening symptoms. Patient is aware of symptoms that would be deemed urgent/emergent, and would thus require further evaluation either here or in the emergency department. At the time of  discharge, she verbalized understanding and consent with the discharge plan as it was reviewed with her. All questions were fielded by provider and/or clinic staff prior to patient discharge.    Final Clinical Impressions / Urgent Care Diagnoses:   Final diagnoses:  Hypertension, unspecified type  Encounter for laboratory testing for COVID-19 virus    New Prescriptions:  [place] Controlled Substance Registry consulted? Not Applicable  No orders of the defined types were placed in this encounter.   Recommended Follow up Care:  Patient encouraged to follow up with the following provider within the specified time frame, or sooner as dictated by the severity of her symptoms. As always, she was instructed that for any urgent/emergent care needs, she should seek care either here or in  the emergency department for more immediate evaluation.  Follow-up Information    Mikey College, NP In 1 week.   Specialty: Nurse Practitioner Why: General reassessment of symptoms if not improving Contact information: [address] [place] [address] [phone]         NOTE: This note was prepared using Dragon dictation software along with smaller phrase technology. Despite my best ability to proofread, there is the potential that transcriptional errors may still occur from this process, and are completely unintentional.   Karen Kitchens, NP 05/29/19 1919

## 2019-06-02 ENCOUNTER — Telehealth: Payer: Self-pay | Admitting: Cardiovascular Disease

## 2019-06-02 ENCOUNTER — Ambulatory Visit: Payer: Self-pay | Admitting: Pharmacist

## 2019-06-02 DIAGNOSIS — I10 Essential (primary) hypertension: Secondary | ICD-10-CM | POA: Diagnosis not present

## 2019-06-02 DIAGNOSIS — E1140 Type 2 diabetes mellitus with diabetic neuropathy, unspecified: Secondary | ICD-10-CM | POA: Diagnosis not present

## 2019-06-02 NOTE — Telephone Encounter (Signed)
Samples placed at our front desk:  Bystolic 5 mg Lot: [id number] Exp: 4/22 # 2 boxes

## 2019-06-02 NOTE — Telephone Encounter (Signed)
Called and spoke to patient. She states that her BP is elevated at 159/95 w/ HR 98. States that she has had a HA, but denies chest pain, changes in her vision, speech, or strength, or any other Sx. She states that her BP has been elevated for a while now and that she tried HCTZ but could not tolerate that since it has sulfa in it. The patient has multiple allergies to BP meds. She states that she has not taken bystolic due to cost. She had been taking 5 mg prn for elevated BP. Patient wanting to know if she should try something different before her appt on 12/31. Made patient aware that we will arrange for samples of bystolic for her to pick up and that I would forward to Dr. Rockey Situ for review and additional recommendation for BP management. ER precautions reviewed with the patient. She verbalized understanding and thanked me for the call.

## 2019-06-02 NOTE — Chronic Care Management (AMB) (Signed)
Chronic Care Management   Follow Up Note   06/02/2019 Name: Joslynne Balser MRN: [id number] DOB: May 03, 1946  Referred by: Mikey College, NP (Inactive) Reason for referral : No chief complaint on file.   Aryauna Vonkaenel is a 73 y.o. year old female who is a primary care patient of Mikey College, NP (Inactive). The CCM team was consulted for assistance with chronic disease management and care coordination needs.  Ms. Jenniges has a past medical history including but not limited to hypertension, lymphedema, type 2 diabetes mellitus and GERD.  Receive a voicemail from Ms. Delia Chimes requesting a call back  I reached out to [name] by phone today.   Review of patient status, including review of consultants reports, relevant laboratory and other test results, and collaboration with appropriate care team members and the patient's provider was performed as part of comprehensive patient evaluation and provision of chronic care management services.     Outpatient Encounter Medications as of 06/02/2019  Medication Sig  . acetaminophen (TYLENOL) 500 MG tablet Take 500 mg by mouth daily as needed.  . ARTIFICIAL TEAR OP Apply to eye.  . BD INSULIN SYRINGE U/F 31G X 5/16" 1 ML MISC USE AS DIRECTED. WITH HUMALOG  . Cholecalciferol (VITAMIN D3) 25 MCG (1000 UT) CAPS Take 4 capsules by mouth daily.   . clindamycin (CLEOCIN) 300 MG capsule Take 600 mg by mouth. 1 Hour before dental procedures  . COLCRYS 0.6 MG tablet Take 1 tablet (0.6 mg total) by mouth 2 (two) times daily as needed. For up to 3 days for pericarditis, or up to 7 days for gout or connective tissue disorder.  . cyclobenzaprine (FLEXERIL) 5 MG tablet TAKE 1 TABLET (5 MG TOTAL) BY MOUTH ONCE DAILY AS NEEDED FOR MUSCLE SPASMS  . famotidine (PEPCID) 20 MG tablet Take 20 mg by mouth 2 (two) times daily.  Marland Kitchen glucose blood (FREESTYLE LITE) test strip USE AS DIRECTED THREE TIMES DAILY FOR DIABETES  .  hydrochlorothiazide (HYDRODIURIL) 12.5 MG tablet Take 1 tablet (12.5 mg total) by mouth daily. (Patient taking differently: Take 12.5 mg by mouth daily. PRN per cardiologist.)  . Insulin Degludec (TRESIBA FLEXTOUCH) 200 UNIT/ML SOPN Inject 56 Units into the skin daily.  . insulin lispro (HUMALOG) 100 UNIT/ML injection Correction scale: take 1 unit per 50 over 150 before meals up to three times daily.  Up to 20 units per day.  . Insulin Pen Needle (PEN NEEDLES) 32G X 5 MM MISC 1 Device by Does not apply route daily.  Marland Kitchen lactase (LACTAID) 3000 units tablet Take by mouth as needed.   . Lancets (FREESTYLE) lancets Must test x4/day  . nebivolol (BYSTOLIC) 10 MG tablet Take 2.5 tablets (25 mg total) by mouth daily. (Patient taking differently: Take 5 mg by mouth. Patient only taking 5mg . Prn as cardiology)  . Nutritional Supplements (NUTRITIONAL SUPPLEMENT PO) Take by mouth. "Blood Boost Formula" by [employer]  . prednisoLONE acetate (PRED FORTE) 1 % ophthalmic suspension INSTILL ONE DROP  as needed  . rosuvastatin (CRESTOR) 10 MG tablet TAKE 1 TABLET BY MOUTH EVERY DAY   No facility-administered encounter medications on file as of 06/02/2019.    Goals Addressed            This Visit's Progress   . PharmD - "I'm in the donut hole" (pt-stated)       Current Barriers:  . Financial Barriers: patient has [place] [employer] Preferred Part D insurance and reports copay for  [name], Humalog, Bystolic and Colcrys is cost prohibitive at this time o Currently in the coverage gap of her coverage  Pharmacist Clinical Goal(s):  Marland Kitchen Over the next 30 days, patient will work with PharmD and providers to relieve medication access concerns  Interventions: . Perform chart review o Per chart, patient seen by Urgent Care on 12/17 with "complaints of weakness, fatigue, cough, balance issues, back and elevated blood pressures" - COVID-19 test performed - virus not detected - BP at visit: 132/84, HR  93 . Encourage patient to follow up with Cardiology as patient reports her hydrochlorothiazide is making her feel ill and has decided to discontinue it. o Patients attributes this to her sulfa allergy - Note cross-reactivity between sulfonamide antibiotics and non-antibiotic sulfonamides is uncommon.  - However, note patient has had multiple medication allergies/adverse reactions, including several to antihypertensive agents. Per allergy list in chart, in 02/2016 patient noted to have had dizziness/lightheadedness with hydrochlorothiazide. o Note patient has appointment scheduled with Cardiology on 12/31 o Ms. Dario states that she will call to follow up with Cardiology office today . Counsel on importance of BP monitoring technique, particularly with use of wrist monitor and concern for accuracy. Recommend upper arm monitor. o Patient states she feels that her wrist monitor works perfectly; does not believe upper arm monitors work well on her. . Coordinated care with [place]. Patient assistance paperwork for Humalog through [employer] and Colcrys through [place] to be faxed to respective companies  Patient Self Care Activities:  . Patient will attend medical appointments as scheduled  Please see past updates related to this goal by clicking on the "Past Updates" button in the selected goal         Plan  The care management team will reach out to the patient again over the next 30 days.   Harlow Asa, PharmD, [place] [employer] [phone]

## 2019-06-02 NOTE — Telephone Encounter (Signed)
Patient stopped taking meds    Pt c/o medication issue:  1. Name of [place]   2. How are you currently taking this medication (dosage and times per day)?  Holding   3. Are you having a reaction (difficulty breathing--STAT)?  Spikes in bp headache    4. What is your medication issue?  Patient has allergies to several things including sulfa and was unable to tolerate .  Patient c/o elevated bp but declined to give additional information.  Patient states she is feeling better but needs to continue conversation she has already had with pam to resolve bp issues . Patient declined sooner appt .  Patient wanted to come in before now because Dr. Rockey Situ has always told her to call if she needed him and he would work her in.  Patient states she will go to ed feels the need to do so.   covid test was negative.

## 2019-06-03 NOTE — Patient Instructions (Signed)
Thank you allowing the Chronic Care Management Team to be a part of your care! It was a pleasure speaking with you today!     CCM (Chronic Care Management) Team    Janci Minor RN, BSN Nurse Care Coordinator  [phone]   Harlow Asa PharmD  Clinical Pharmacist  [phone]   Eula Fried LCSW Clinical Social Worker [phone]  Visit Information  Goals Addressed            This Visit's Progress   . PharmD - "I'm in the donut hole" (pt-stated)       Current Barriers:  . Financial Barriers: patient has [place] [employer] Preferred Part D insurance and reports copay for Tresiba, Humalog, Bystolic and Colcrys is cost prohibitive at this time o Currently in the coverage gap of her coverage  Pharmacist Clinical Goal(s):  Marland Kitchen Over the next 30 days, patient will work with PharmD and providers to relieve medication access concerns  Interventions: . Perform chart review o Per chart, patient seen by Urgent Care on 12/17 with "complaints of weakness, fatigue, cough, balance issues, back and elevated blood pressures" - COVID-19 test performed - virus not detected - BP at visit: 132/84, HR 93 . Encourage patient to follow up with Cardiology as patient reports her hydrochlorothiazide is making her feel ill and has decided to discontinue it. o Patients attributes this to her sulfa allergy - Note cross-reactivity between sulfonamide antibiotics and non-antibiotic sulfonamides is uncommon.  - However, note patient has had multiple medication allergies/adverse reactions, including several to antihypertensive agents. Per allergy list in chart, in 02/2016 patient noted to have had dizziness/lightheadedness with hydrochlorothiazide. o Note patient has appointment scheduled with Cardiology on 12/31 o Ms. Freundlich states that she will call to follow up with Cardiology office today . Counsel on importance of BP monitoring technique, particularly with use of wrist monitor and concern for  accuracy.  . Coordinated care with [place]. Patient assistance paperwork for Humalog through [employer] and Colcrys through [place] to be faxed to respective companies  Patient Self Care Activities:  . Patient will attend medical appointments as scheduled  Please see past updates related to this goal by clicking on the "Past Updates" button in the selected goal         The patient verbalized understanding of instructions provided today and declined a print copy of patient instruction materials.   The care management team will reach out to the patient again over the next 30 days.   Harlow Asa, PharmD, [place] [employer] [phone]

## 2019-06-08 NOTE — Telephone Encounter (Signed)
error 

## 2019-06-09 NOTE — Telephone Encounter (Signed)
Options for BP are limited given numerous allergies/intolerances Could try cardura 1 mg daily This is unlike any other medication she has tried Scheduled to be seen in clinic on 31st Clonidine an options as well

## 2019-06-10 ENCOUNTER — Telehealth: Payer: Self-pay | Admitting: Cardiovascular Disease

## 2019-06-10 NOTE — Telephone Encounter (Signed)
° ° °  COVID-19 Pre-Screening Questions:   In the past 7 to 10 days have you had a cough,  shortness of breath, headache, congestion, fever (100 or greater) body aches, chills, sore throat, or sudden loss of taste or sense of smell? NO  Have you been around anyone with known Covid 19. no  Have you been around anyone who is awaiting Covid 19 test results in the past 7 to 10 days? no  Have you been around anyone who has been exposed to Covid 19, or has mentioned symptoms of Covid 19 within the past 7 to 10 days? yes  If you have any concerns/questions about symptoms patients report during screening (either on the phone or at [facility]). Contact the provider seeing the patient or DOD for further guidance.  If neither are available contact a member of the leadership team.   Patient roommate exposure with no current symptoms   roomates dad got now positive and symptomatic from exposure to guest in home on Saturday .  Roommate was also exposed to guest on Saturday but is not symptomatic.  Patient has been exposed to roommate without ppe during Saturday timeframe but since knowing about illness has been maintaining distance.   Please advise on samples and appt reschedule timeframe.

## 2019-06-10 NOTE — Telephone Encounter (Signed)
Attempted to call patient. LMTCB 06/10/2019  Pt has appt tom. I was attempting to see if patient has enough samples to last until she is seen in clinic to further discuss medication options.

## 2019-06-10 NOTE — Telephone Encounter (Signed)

## 2019-06-10 NOTE — Telephone Encounter (Signed)
Call to patient. She reports that she has enough bystolic samples to last until Monday. She reports that she is unable to afford it. Gollan suggested that she try another medication but patient is very concerned d/t hx allergies.   Pt has been exposed in the past week to covid positive person in her home.   She is agreeable to virtual visit tomorrow in place of office visit. Pt verbalized agreement to consent.   Pt also has a question about imaging done in the past on stomach. 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 Pt has concern that it could be contributing to HTN.   Information forwarded to provider seeing the patient virtually tomorrow.   Advised pt to call for any further questions or concerns.

## 2019-06-11 ENCOUNTER — Ambulatory Visit: Payer: PRIVATE HEALTH INSURANCE | Admitting: Nurse Practitioner

## 2019-06-11 ENCOUNTER — Other Ambulatory Visit: Payer: Self-pay

## 2019-06-11 ENCOUNTER — Encounter: Payer: Self-pay | Admitting: Nurse Practitioner

## 2019-06-11 ENCOUNTER — Telehealth: Payer: Self-pay

## 2019-06-11 ENCOUNTER — Telehealth (INDEPENDENT_AMBULATORY_CARE_PROVIDER_SITE_OTHER): Payer: Medicare Other | Admitting: Nurse Practitioner

## 2019-06-11 VITALS — BP 153/81 | HR 61 | Ht 69.0 in | Wt 240.0 lb

## 2019-06-11 DIAGNOSIS — I10 Essential (primary) hypertension: Secondary | ICD-10-CM | POA: Diagnosis not present

## 2019-06-11 MED ORDER — NEBIVOLOL HCL 10 MG PO TABS: 10.0000 mg | ORAL_TABLET | Freq: Every day | ORAL | 5 refills | Status: DC

## 2019-06-11 NOTE — Patient Instructions (Signed)
Medication Instructions:   We have increased your Bystolic to 10mg  daily.  *If you need a refill on your cardiac medications before your next appointment, please call your pharmacy*  Lab Work: You need to have a 24 hour urine for catecholamines and metanephrines. You will need to come to the [facility] lab for you specimen container.  Hours are Monday through Friday 7am to 6pm. Please follow the instructions provided by the lab for collection and returning specimen.  If you have labs (blood work) drawn today and your tests are completely normal, you will receive your results only by: Marland Kitchen MyChart Message (if you have MyChart) OR . A paper copy in the mail If you have any lab test that is abnormal or we need to change your treatment, we will call you to review the results.  Testing/Procedures: None ordered   Follow-Up: At [facility], you and your health needs are our priority.  As part of our continuing mission to provide you with exceptional heart care, we have created designated Provider Care Teams.  These Care Teams include your primary Cardiologist (physician) and Advanced Practice Providers (APPs -  Physician Assistants and Nurse Practitioners) who all work together to provide you with the care you need, when you need it.  Your next appointment:   1 month(s)  The format for your next appointment:   Either In Person or Virtual  Provider:    You may see Ida Rogue, MD or one of the following Advanced Practice Providers on your designated Care Team:    Murray Hodgkins, NP  Christell Faith, PA-C  Marrianne Mood, PA-C   Other Instructions  Urine Metanephrines Test Why am I having this test? The urine metanephrines test is used to check for a rare type of adrenal gland tumor called a pheochromocytoma. These tumors are usually not cancerous (are benign), but they create excess hormones that can cause symptoms. You may have this test if you have been treated for a  pheochromocytoma or if you have symptoms of having one. These symptoms include:  High blood pressure.  Headaches.  Rapid heartbeat.  Sweating. What is being tested? This test measures the amount of metanephrines in your urine. Metanephrines are substances that result from the breakdown of two hormones, epinephrine and norepinephrine. These hormones are made by your adrenal glands, which are located on top of your kidneys. Adrenal glands release these hormones during times of stress. After they are released, the hormones break down into metanephrine and normetanephrine and leave your body in your urine. It is normal to find a small amount of metanephrines in urine. However, if you have a large amount of metanephrines in your urine, this can be a sign of a pheochromocytoma tumor. What kind of sample is taken? A urine sample is required for this test. Your health care provider will give you a container to collect all the urine you produce over 24 hours. How do I collect samples at home?  You may be asked to collect a urine sample at home over a 24-hour period. Follow instructions from a health care provider about how to collect the sample. When collecting a urine sample at home, make sure you:  Use supplies and instructions that you received from the lab.  Collect urine only in the germ-free (sterile) cup that you received from the lab.  Do not let any toilet paper or stool (feces) get into the cup.  Refrigerate the sample until you can return it to the  lab.  Return the samples to the lab as instructed. How do I prepare for this test? Many medicines, some foods, and heavy exercise can increase urinary metanephrines.  Let your health care provider know about all medicines you are taking, including vitamins, herbs, eye drops, creams, and over-the-counter medicines. You may need to stop taking them before the test.  Do not exercise heavily before or during the testing period as told by  your health care provider.  Follow instructions from your health care provider about what foods or drinks you should avoid before and during the test. How are the results reported? Your test results will be reported as values. Your health care provider will compare your results to normal ranges that were established after testing a large group of people (reference ranges). Reference ranges may vary among labs and hospitals. For this test, common reference ranges are:  Metanephrine: Less than 1.3 mg/24 hr or less than 7 micromoles/day (SI units).  Normetanephrine: 15-80 mcg/24 hr or 89-473 nmol/day (SI units). What do the results mean? Abnormally high levels of metanephrine and normetanephrine may mean:  You have a pheochromocytoma.  A previous pheochromocytoma has returned after treatment. Abnormally high levels could also mean that your test result was a false positive. This happens when something else causes your metanephrines to go up. Stress or certain medicines can make this happen. Your health care provider may do more tests to confirm a diagnosis of pheochromocytoma. If your test results are within the reference ranges, you are unlikely to have a pheochromocytoma. Talk with your health care provider about what your results mean. Questions to ask your health care provider Ask your health care provider, or the department that is doing the test:  When will my results be ready?  How will I get my results?  What are my treatment options?  What other tests do I need?  What are my next steps? Summary  The urine metanephrines test is used to check for an adrenal gland tumor called a pheochromocytoma.  Symptoms of this type of tumor include high blood pressure, elevated heart rate, excessive sweating, and headaches.  False-positive results can occur. Your health care provider may do more tests to confirm a diagnosis of pheochromocytoma.  If your test results are within the  reference ranges, you are unlikely to have a pheochromocytoma. This information is not intended to replace advice given to you by your health care provider. Make sure you discuss any questions you have with your health care provider. Document Revised: 01/15/2018 Document Reviewed: 01/24/2017 Elsevier Patient Education  [place].

## 2019-06-11 NOTE — Telephone Encounter (Signed)
Spoke with pt and provided her with the instructions that she needs to go to the medical mall for the specimen container. Pt verbalized understanding.

## 2019-06-11 NOTE — Telephone Encounter (Signed)
Message left for pt to callback for follow-up from telehealth visit today with Ignacia Bayley, NP.  Pt also needs instructions to go to the medical mall for specimen container for 24 hr urine.  Request has been made for AVS to be sent to the pt.

## 2019-06-11 NOTE — Telephone Encounter (Signed)
Patient returning your call, will be available the rest of the afternoon

## 2019-06-11 NOTE — Progress Notes (Signed)
Virtual Visit via Telephone Note   This visit type was conducted due to national recommendations for restrictions regarding the COVID-19 Pandemic (e.g. social distancing) in an effort to limit this patient's exposure and mitigate transmission in our community.  Due to her co-morbid illnesses, this patient is at least at moderate risk for complications without adequate follow up.  This format is felt to be most appropriate for this patient at this time.  The patient did not have access to video technology/had technical difficulties with video requiring transitioning to audio format only (telephone).  All issues noted in this document were discussed and addressed.  No physical exam could be performed with this format.  Please refer to the patient's chart for her  consent to telehealth for [facility]. Evaluation Performed:  Follow-up visit  This visit type was conducted due to national recommendations for restrictions regarding the COVID-19 Pandemic (e.g. social distancing).  This format is felt to be most appropriate for this patient at this time.  All issues noted in this document were discussed and addressed.  No physical exam was performed (except for noted visual exam findings with Video Visits).  Please refer to the patient's chart (MyChart message for video visits and phone note for telephone visits) for the patient's consent to telehealth for [facility]. _____________   Date:  06/11/2019   Patient ID:  [name], DOB 01/16/1946, MRN [id number] Patient Location:  Home Provider location:   Office  Primary Care Provider:  Mikey College, NP (Inactive) Primary Cardiologist:  Ida Rogue, MD  Chief Complaint    73 y/o ? w/a h/o labile hypertension, pericarditis, multiple medication intolerances, peripheral and carotid arterial disease, recurrent UTIs, obesity, hiatal hernia, aortic atherosclerosis, and history of bleeding ulcers, who presents for telephonic  follow-up related to hypertension.  Past Medical History    Past Medical History:  Diagnosis Date  . Anterolisthesis    Cervical spine  . Asthma   . Connective tissue disorder (HCC)    recurrent carotid arteritis, temporal arteritis, vasculitis mandible, general hepatitis, avascular necrosis bilat  . DDD (degenerative disc disease), lumbar   . Diabetes mellitus without complication ([place])   . Diverticulosis   . Duodenitis   . Gouty arthritis   . Hiatal hernia with GERD   . History of cardiovascular stress test    a. 07/2018 [facility]): Fixed inferoapical defect w/ nl contraction-->attenuation artifact. No ischemia. EF 63%.  . Hyperlipidemia   . Hypertension    a. 02/2019 Renal artery duplex: no evidence of prox RAS.  Marland Kitchen Hyperuricemia   . Keratitis sicca, bilateral ([facility])   . Lymphedema of both lower extremities   . Osteoarthritis   . Pericarditis    Recurrent: Aug 97, July 05, Sept 08, July 16, July 18  . PUD (peptic ulcer disease)   . 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[place])   . Sjogren's syndrome ([place])   . Syncope   . Tendonitis, Achilles, right   . Tortuous colon   . Trigger finger    Past Surgical History:  Procedure Laterality Date  . ABDOMINAL HYSTERECTOMY    . BREAST BIOPSY    . BREAST EXCISIONAL BIOPSY Left 1986  . CHOLECYSTECTOMY    . COLONOSCOPY WITH PROPOFOL N/A 08/26/2018   Procedure: COLONOSCOPY WITH PROPOFOL;  Surgeon: Lucilla Lame, MD;  Location: [facility] ENDOSCOPY;  Service: Endoscopy;  Laterality: N/A;  . CYSTOSCOPY  02/26/2018   [name], MD   . distal arthrectomy    . ESOPHAGOGASTRODUODENOSCOPY (EGD)  WITH PROPOFOL N/A 08/26/2018   Procedure: ESOPHAGOGASTRODUODENOSCOPY (EGD) WITH PROPOFOL;  Surgeon: Lucilla Lame, MD;  Location: [facility] ENDOSCOPY;  Service: Endoscopy;  Laterality: N/A;  . EXCISION NEUROMA    . KNEE SURGERY Bilateral    1985, 2001, 11/2002, 12/2006  . panhysterectomy  10/1983  . SHOULDER SURGERY Right    reverse total arthroplasty w/ biceps tenodesis    . TONSILLECTOMY AND ADENOIDECTOMY    . TOTAL HIP ARTHROPLASTY Right 04/2007  . WISDOM TOOTH EXTRACTION      Allergies  Allergies  Allergen Reactions  . Amlodipine     Other reaction(s): Unknown  . Aspirin     Other reaction(s): Unknown  . Bee Venom   . Ciprofloxacin     Other reaction(s): Other (see comments), Unknown  . Codeine     Other reaction(s): Unknown Must have pre medications before taking per patient report Includes all derivatives   . Erythromycin     Other reaction(s): Unknown, Unknown  . Glimepiride     Other reaction(s): Unknown  . Hydralazine     Other reaction(s): Unknown  . Hydrochlorothiazide     Other reaction(s): Dizziness or lightheadedness.  . Hydrocodone     Other reaction(s): Unknown Must have pre medications before taking per patient report  . Hydrocodone-Acetaminophen Nausea And Vomiting    MUST BE PREMEDICATED  . Hydromorphone Nausea And Vomiting    Other reaction(s): Unknown Must have pre medications before taking per patient report MUST BE PREMEDICATED   . Irbesartan     Other reaction(s): Unknown  . Irbesartan-Hydrochlorothiazide     Other reaction(s): Unknown  . Lisinopril     Other reaction(s): Unknown  . Maxitrol [Neomycin-Polymyxin-Dexameth]   . Metformin And Related   . Metformin Hcl     Other reaction(s): Unknown  . Methylcellulose     Other reaction(s): Unknown  . Metoclopramide Nausea And Vomiting    Other reaction(s): Unknown  . Metoprolol     Other reaction(s): Unknown  . Neomycin-Bacitracin Zn-Polymyx     Other reaction(s): Unknown  . Norvasc [Amlodipine Besylate]   . Nsaids Other (See Comments)    Other reaction(s): Unknown bleeding   . Omeprazole Magnesium     Other reaction(s): Unknown  . Other     Pt is allergic to staples and surgical metals  . Oxycodone-Acetaminophen     Other reaction(s): Unknown, Unknown Must have pre medications before taking per patient report   . Penicillins     Other  reaction(s): Unknown, Unknown  . Pioglitazone     Other reaction(s): Unknown  . Poison [employer]     Poison [place] and [place] also  . Prilosec [Omeprazole]   . Silver Sulfadiazine     Other reaction(s): Unknown  . Sulfa Antibiotics     Other reaction(s): Unknown, Unknown  . Tape     Other reaction(s): Unknown  . Tetanus Toxoid Other (See Comments)  . Tramadol     Other reaction(s): Unknown Must have pre medications before taking per patient report  . Betadine [Povidone Iodine] Rash    Only topically when left on skin.    History of Present Illness    [name] is a 73 y.o. female who presents via audio/video conferencing for a telehealth visit today.  She has a h/o of labile hypertension, pericarditis, multiple medication intolerances, peripheral and carotid arterial disease, recurrent UTIs, obesity, hiatal hernia, aortic atherosclerosis, and history of bleeding ulcers.  She has had multiple recurrences of pericarditis and has been  treated at [facility] with colchicine and steroids in the past.  Last episode was July 2018.  She underwent stress testing in February 2020 as part of preoperative evaluation, and this was low risk without ischemia.  EF was 63%.  She was referred to Dr. Rockey Situ in June 2020 in the setting of labile hypertension.  Renal artery duplex was carried out in September of this year did not show any evidence of proximal renal artery stenosis.    Recently, she has noted recurrent fluctuations in blood pressure with a recent spike into the 200s on December 17 prompting ER evaluation.  Because of her multiple medication intolerances, options have been limited but she does tolerate Bystolic.  5 mg of Bystolic was recently resumed and pressures have been trending in the 130s to 150s.  She is concerned about cost of Bystolic in the long run.  She denies chest pain or dyspnea, PND, orthopnea, dizziness, syncope, edema, or early satiety  The patient does not have  symptoms concerning for COVID-19 infection (fever, chills, cough, or new shortness of breath).   Home Medications    Prior to Admission medications   Medication Sig Start Date End Date Taking? Authorizing Provider  acetaminophen (TYLENOL) 500 MG tablet Take 500 mg by mouth daily as needed.   Yes [provider]  ARTIFICIAL TEAR OP Apply to eye.   Yes [provider]  BD INSULIN SYRINGE U/F 31G X 5/16" 1 ML MISC USE AS DIRECTED. WITH HUMALOG 07/28/18  Yes Mikey College, NP  Cholecalciferol (VITAMIN D3) 25 MCG (1000 UT) CAPS Take 4 capsules by mouth daily.    Yes [provider]  COLCRYS 0.6 MG tablet Take 1 tablet (0.6 mg total) by mouth 2 (two) times daily as needed. For up to 3 days for pericarditis, or up to 7 days for gout or connective tissue disorder. 05/18/19  Yes Karamalegos, Devonne Doughty, DO  cyclobenzaprine (FLEXERIL) 5 MG tablet TAKE 1 TABLET (5 MG TOTAL) BY MOUTH ONCE DAILY AS NEEDED FOR MUSCLE SPASMS 05/25/19  Yes Karamalegos, Devonne Doughty, DO  famotidine (PEPCID) 20 MG tablet Take 20 mg by mouth 2 (two) times daily.   Yes [provider]  glucose blood (FREESTYLE LITE) test strip USE AS DIRECTED THREE TIMES DAILY FOR DIABETES 02/11/19  Yes Mikey College, NP  Insulin Degludec (TRESIBA FLEXTOUCH) 200 UNIT/ML SOPN Inject 56 Units into the skin daily. 03/06/19  Yes Mikey College, NP  insulin lispro (HUMALOG) 100 UNIT/ML injection Correction scale: take 1 unit per 50 over 150 before meals up to three times daily.  Up to 20 units per day. 02/12/19  Yes Mikey College, NP  Insulin Pen Needle (PEN NEEDLES) 32G X 5 MM MISC 1 Device by Does not apply route daily. 05/22/18  Yes Mikey College, NP  lactase (LACTAID) 3000 units tablet Take by mouth as needed.    Yes [provider]  Lancets (FREESTYLE) lancets Must test x4/day 06/19/16  Yes [provider]  nebivolol (BYSTOLIC) 10 MG tablet Take 1 tablet (10 mg total)  by mouth daily. 06/11/19  Yes Theora Gianotti, NP  Nutritional Supplements (NUTRITIONAL SUPPLEMENT PO) Take by mouth. "Blood Boost Formula" by [employer]   Yes [provider]  prednisoLONE acetate (PRED FORTE) 1 % ophthalmic suspension INSTILL ONE DROP  as needed 08/05/15  Yes [provider]  rosuvastatin (CRESTOR) 10 MG tablet TAKE 1 TABLET BY MOUTH EVERY DAY 04/17/19  Yes Parks Ranger, Devonne Doughty,  DO  clindamycin (CLEOCIN) 300 MG capsule Take 600 mg by mouth. 1 Hour before dental procedures    [provider]    Review of Systems    She denies chest pain, palpitations, dyspnea, pnd, orthopnea, n, v, dizziness, syncope, edema, weight gain, or early satiety.  All other systems reviewed and are otherwise negative except as noted above.  Physical Exam    Vital Signs:  BP (!) 153/81   Pulse 61   Ht 5\' 9"  (1.753 m)   Wt 240 lb (108.9 kg)   BMI 35.44 kg/m    Pleasant, no acute distress, awake alert and oriented x3.  Breathing unlabored.  Assessment & Plan    1.  Essential hypertension/labile hypertension: Recent ER visit due to pressure spike.  She had been taking HCTZ but does not think she tolerates this.  She is now on Bystolic 5 mg daily and pressures have been trending in the 1 30-1 50 range.  I am going to increase her Bystolic to 10 mg daily.  Heart rates typically trend in the mid 60s to mid 70s and she will continue to pay attention to this.  I did offer reassurance that as long as she is asymptomatic, heart rates in the 50s is acceptable to [place].  She does have some concerns about long-term cost of Bystolic but will work with her pharmacy to see what these cost will be.  COVID-19 Education: The signs and symptoms of COVID-19 were discussed with the patient and how to seek care for testing (follow up with PCP or arrange E-visit).  The importance of social distancing was discussed today.  Patient Risk:   After full review of this patient's  history and clinical status, I feel that he is at least moderate risk for cardiac complications at this time, thus necessitating a telehealth visit sooner than our first available in office visit.  Time:   Today, I have spent 30 minutes with the patient with telehealth technology discussing medical history, symptoms, and management plan.     Murray Hodgkins, NP 06/11/2019, 4:55 PM

## 2019-06-15 NOTE — Progress Notes (Signed)
Of note, Pt did report recent contact w/ a Covid+ individual.  Though she had noted subjective fevers, she has never documented any temp above 98.6.  Given lack of symptoms, she was planning on isolating at home but is aware that she will either need to contact her PCP or present to the ED for evaluation, if she were to develop symptoms of Covid19.  Murray Hodgkins, NP 06/15/2019, 4:20 PM

## 2019-06-16 ENCOUNTER — Telehealth: Payer: Self-pay

## 2019-06-16 ENCOUNTER — Ambulatory Visit (INDEPENDENT_AMBULATORY_CARE_PROVIDER_SITE_OTHER): Payer: Medicare Other | Admitting: Pharmacist

## 2019-06-16 DIAGNOSIS — M109 Gout, unspecified: Secondary | ICD-10-CM

## 2019-06-16 DIAGNOSIS — N183 Chronic kidney disease, stage 3 unspecified: Secondary | ICD-10-CM | POA: Diagnosis not present

## 2019-06-16 DIAGNOSIS — I129 Hypertensive chronic kidney disease with stage 1 through stage 4 chronic kidney disease, or unspecified chronic kidney disease: Secondary | ICD-10-CM | POA: Diagnosis not present

## 2019-06-16 DIAGNOSIS — E1140 Type 2 diabetes mellitus with diabetic neuropathy, unspecified: Secondary | ICD-10-CM | POA: Diagnosis not present

## 2019-06-16 NOTE — Chronic Care Management (AMB) (Addendum)
Chronic Care Management   Follow Up Note   06/16/2019 Name: [name] MRN: [id number] DOB: 1945-12-06  Referred by: Mikey College, NP (Inactive) Reason for referral : Care Coordination (Patient Assistance Programs)   [name] is a 74 y.o. year old female who is a primary care patient of Mikey College, NP (Inactive). The CCM team was consulted for assistance with chronic disease management and care coordination needs.    Coordination of care calls to [employer] and [place] patient assistance programs today.  Review of patient status, including review of consultants reports, relevant laboratory and other test results, and collaboration with appropriate care team members and the patient's provider was performed as part of comprehensive patient evaluation and provision of chronic care management services.     Outpatient Encounter Medications as of 06/16/2019  Medication Sig  . acetaminophen (TYLENOL) 500 MG tablet Take 500 mg by mouth daily as needed.  . ARTIFICIAL TEAR OP Apply to eye.  . BD INSULIN SYRINGE U/F 31G X 5/16" 1 ML MISC USE AS DIRECTED. WITH HUMALOG  . Cholecalciferol (VITAMIN D3) 25 MCG (1000 UT) CAPS Take 4 capsules by mouth daily.   . clindamycin (CLEOCIN) 300 MG capsule Take 600 mg by mouth. 1 Hour before dental procedures  . COLCRYS 0.6 MG tablet Take 1 tablet (0.6 mg total) by mouth 2 (two) times daily as needed. For up to 3 days for pericarditis, or up to 7 days for gout or connective tissue disorder.  . cyclobenzaprine (FLEXERIL) 5 MG tablet TAKE 1 TABLET (5 MG TOTAL) BY MOUTH ONCE DAILY AS NEEDED FOR MUSCLE SPASMS  . famotidine (PEPCID) 20 MG tablet Take 20 mg by mouth 2 (two) times daily.  Marland Kitchen glucose blood (FREESTYLE LITE) test strip USE AS DIRECTED THREE TIMES DAILY FOR DIABETES  . Insulin Degludec (TRESIBA FLEXTOUCH) 200 UNIT/ML SOPN Inject 56 Units into the skin daily.  . insulin lispro (HUMALOG) 100 UNIT/ML injection Correction  scale: take 1 unit per 50 over 150 before meals up to three times daily.  Up to 20 units per day.  . Insulin Pen Needle (PEN NEEDLES) 32G X 5 MM MISC 1 Device by Does not apply route daily.  Marland Kitchen lactase (LACTAID) 3000 units tablet Take by mouth as needed.   . Lancets (FREESTYLE) lancets Must test x4/day  . nebivolol (BYSTOLIC) 10 MG tablet Take 1 tablet (10 mg total) by mouth daily.  . Nutritional Supplements (NUTRITIONAL SUPPLEMENT PO) Take by mouth. "Blood Boost Formula" by [employer]  . prednisoLONE acetate (PRED FORTE) 1 % ophthalmic suspension INSTILL ONE DROP  as needed  . rosuvastatin (CRESTOR) 10 MG tablet TAKE 1 TABLET BY MOUTH EVERY DAY   No facility-administered encounter medications on file as of 06/16/2019.    Goals Addressed            This Visit's Progress   . PharmD - "I'm in the donut hole" (pt-stated)       Current Barriers:  . Financial Barriers: patient has [place] [employer] Preferred Part D insurance and reports copay for Tresiba, Humalog, Bystolic and Colcrys is cost prohibitive at this time o Currently in the coverage gap of her coverage  Pharmacist Clinical Goal(s):  Marland Kitchen Over the next 30 days, patient will work with PharmD and providers to relieve medication access concerns  Interventions: . Coordinated care with [place]. Patient assistance paperwork for Humalog through [employer] and [employer] through faxed to respective companies on 06/02/2019. Marland Kitchen Care  coordination call today to [employer] [phone]) in regard to patient's re-enrollment application for Humalog. o Speak to [place] who states the provider portion of patient's application is missing state license number.  o Provide representative with missing information. Malachy Mood states that application can now be processed. Recommends calling back after 3 business days. . Care coordination call today to Bernita Buffy [phone]) in regard to patient's re-enrollment application for Colcrys. o  Speak to [facility] who states patient's application requires further information and needs to be re-faxed.  . Coordination of care call with [place] to request Bernita Buffy application be re-faxed . Collaborate with Asa Lente, CMA in office, and send provider portion of patient assistance applications for Tresiba from [employer] to Dr. Parks Ranger. o Request return fax to [facility] office at [phone]  Patient Self Care Activities:  . Patient will attend medical appointments as scheduled  Please see past updates related to this goal by clicking on the "Past Updates" button in the selected goal         Plan  Will follow up again with patient assistance programs within the next 14 days.  Harlow Asa, PharmD, [place] [employer] [phone]

## 2019-06-24 ENCOUNTER — Ambulatory Visit: Payer: Medicare Other | Admitting: Pharmacist

## 2019-06-24 DIAGNOSIS — E1140 Type 2 diabetes mellitus with diabetic neuropathy, unspecified: Secondary | ICD-10-CM

## 2019-06-24 DIAGNOSIS — M109 Gout, unspecified: Secondary | ICD-10-CM

## 2019-06-24 NOTE — Chronic Care Management (AMB) (Signed)
Chronic Care Management   Follow Up Note   06/24/2019 Name: [name] MRN: [id number] DOB: [birth year]  Referred by: Patricia College, NP (Inactive) Reason for referral : Chronic Care Management (Patient Phone Call) and Care Coordination ([place])   [name] is a 74 y.o. year old female who is a primary care patient of Patricia College, NP (Inactive). The CCM team was consulted for assistance with chronic disease management and care coordination needs.    Coordination of care calls to [employer] and [place] patient assistance programs today.  I reached out to [name] by phone today.   Review of patient status, including review of consultants reports, relevant laboratory and other test results, and collaboration with appropriate care team members and the patient's provider was performed as part of comprehensive patient evaluation and provision of chronic care management services.     Outpatient Encounter Medications as of 06/24/2019  Medication Sig  . acetaminophen (TYLENOL) 500 MG tablet Take 500 mg by mouth daily as needed.  . ARTIFICIAL TEAR OP Apply to eye.  . BD INSULIN SYRINGE U/F 31G X 5/16" 1 ML MISC USE AS DIRECTED. WITH HUMALOG  . Cholecalciferol (VITAMIN D3) 25 MCG (1000 UT) CAPS Take 4 capsules by mouth daily.   . clindamycin (CLEOCIN) 300 MG capsule Take 600 mg by mouth. 1 Hour before dental procedures  . COLCRYS 0.6 MG tablet Take 1 tablet (0.6 mg total) by mouth 2 (two) times daily as needed. For up to 3 days for pericarditis, or up to 7 days for gout or connective tissue disorder.  . cyclobenzaprine (FLEXERIL) 5 MG tablet TAKE 1 TABLET (5 MG TOTAL) BY MOUTH ONCE DAILY AS NEEDED FOR MUSCLE SPASMS  . famotidine (PEPCID) 20 MG tablet Take 20 mg by mouth 2 (two) times daily.  Marland Kitchen glucose blood (FREESTYLE LITE) test strip USE AS DIRECTED THREE TIMES DAILY FOR DIABETES  . Insulin Degludec (TRESIBA FLEXTOUCH) 200 UNIT/ML SOPN  Inject 56 Units into the skin daily.  . insulin lispro (HUMALOG) 100 UNIT/ML injection Correction scale: take 1 unit per 50 over 150 before meals up to three times daily.  Up to 20 units per day.  . Insulin Pen Needle (PEN NEEDLES) 32G X 5 MM MISC 1 Device by Does not apply route daily.  Marland Kitchen lactase (LACTAID) 3000 units tablet Take by mouth as needed.   . Lancets (FREESTYLE) lancets Must test x4/day  . nebivolol (BYSTOLIC) 10 MG tablet Take 1 tablet (10 mg total) by mouth daily.  . Nutritional Supplements (NUTRITIONAL SUPPLEMENT PO) Take by mouth. "Blood Boost Formula" by [employer]  . prednisoLONE acetate (PRED FORTE) 1 % ophthalmic suspension INSTILL ONE DROP  as needed  . rosuvastatin (CRESTOR) 10 MG tablet TAKE 1 TABLET BY MOUTH EVERY DAY   No facility-administered encounter medications on file as of 06/24/2019.    Goals Addressed            This Visit's Progress   . PharmD - "I'm in the donut hole" (pt-stated)       Current Barriers:  . Financial Barriers: patient has [place] [employer] Preferred Part D insurance and reports copay for Tresiba, Humalog, Bystolic and Colcrys is cost prohibitive at this time  Pharmacist Clinical Goal(s):  Marland Kitchen Over the next 30 days, patient will work with PharmD and providers to relieve medication access concerns  Interventions: . Provider and patient portions of patient assistance paperwork for [place] from [employer] received.  Faxed application, including required supplemental documents to [employer] today. . Care coordination call today to [facility] [phone]) in regard to patient's re-enrollment application for Humalog. o Speak to [place] who states that the patient has been approved for the program, effective 06/22/2019-06/10/2020 . Care coordination call today to [facility], dispensing pharmacy for [facility], provider line [phone]) o Speak with Jearl Klinefelter, who confirms patient's prescription is on file and advises that  patient should call when ready for a refill. Note patient last received a refill from program in 05/2019. Marland Kitchen Care coordination call today to Bernita Buffy [phone]) in regard to patient's re-enrollment application for Colcrys. o Speak to [place] who confirms received resubmitted application and states that status is: still awaiting processing.  o Advises to have patient call back next week.  . Follow up call to Ms. Carver to update her regarding patient assistance applications statuses and confirm that she has phone numbers for each program o Advise patient to follow up with [place] assistance program next week . Address patient's general questions about COVID-19 vaccination . Encourage patient to follow up with Rheumatology clinic for recommendation as patient asks about whether she should receive one of the currently available COVID-19 vaccines based on her connective tissue disease and other medical conditions, when she is eligible.  Patient Self Care Activities:  . Patient will attend medical appointments as scheduled  Please see past updates related to this goal by clicking on the "Past Updates" button in the selected goal         Plan  CM Pharmacist will place follow up call to [employer] Patient Assistance Program in the next 14 days.  Harlow Asa, PharmD, [place] [employer] [phone]

## 2019-06-24 NOTE — Patient Instructions (Signed)
Thank you allowing the Chronic Care Management Team to be a part of your care! It was a pleasure speaking with you today!     CCM (Chronic Care Management) Team    Janci Minor RN, BSN Nurse Care Coordinator  [phone]   Harlow Asa PharmD  Clinical Pharmacist  [phone]   Eula Fried LCSW Clinical Social Worker [phone]  Visit Information  Goals Addressed            This Visit's Progress   . PharmD - "I'm in the donut hole" (pt-stated)       Current Barriers:  . Financial Barriers: patient has [place] [employer]Rx Preferred Part D insurance and reports copay for Tresiba, Humalog, Bystolic and Colcrys is cost prohibitive at this time  Pharmacist Clinical Goal(s):  Marland Kitchen Over the next 30 days, patient will work with PharmD and providers to relieve medication access concerns  Interventions: . Provider and patient portions of patient assistance paperwork for [place] from [employer] received. Faxed application, including required supplemental documents to [employer] today. . Care coordination call today to [facility] [phone]) in regard to patient's re-enrollment application for Humalog. o Speak to [place] who states that the patient has been approved for the program, effective 06/22/2019-06/10/2020 . Care coordination call today to [facility], dispensing pharmacy for [facility], provider line [phone]) o Speak with Jearl Klinefelter, who confirms patient's prescription is on file and advises that patient should call when ready for a refill. Note patient last received a refill from program in 05/2019. Marland Kitchen Care coordination call today to Bernita Buffy [phone]) in regard to patient's re-enrollment application for Colcrys. o Speak to [place] who confirms received resubmitted application and states that status is: still awaiting processing.  o Advises to have patient call back next week.  . Follow up call to Ms. Baby to update her regarding patient  assistance applications statuses and confirm that she has phone numbers for each program o Advise patient to follow up with [place] assistance program next week . Address patient's general questions about COVID-19 vaccination . Encourage patient to follow up with Rheumatology clinic for recommendation as patient asks about whether she should receive one of the currently available COVID-19 vaccines based on her connective tissue disease and other medical conditions, when she is eligible.  Patient Self Care Activities:  . Patient will attend medical appointments as scheduled  Please see past updates related to this goal by clicking on the "Past Updates" button in the selected goal         The patient verbalized understanding of instructions provided today and declined a print copy of patient instruction materials.   The care management team will reach out to the patient again over the next 30 days.   Harlow Asa, PharmD, [place] [employer] [phone]

## 2019-07-07 ENCOUNTER — Other Ambulatory Visit
Admission: RE | Admit: 2019-07-07 | Discharge: 2019-07-07 | Disposition: A | Discharge status: Home / Self Care | Payer: Medicare Other | Source: Ambulatory Visit | Attending: Nurse Practitioner | Admitting: Nurse Practitioner

## 2019-07-07 DIAGNOSIS — I10 Essential (primary) hypertension: Secondary | ICD-10-CM | POA: Insufficient documentation

## 2019-07-08 ENCOUNTER — Ambulatory Visit: Payer: Medicare Other | Admitting: Pharmacist

## 2019-07-08 DIAGNOSIS — N183 Chronic kidney disease, stage 3 unspecified: Secondary | ICD-10-CM | POA: Diagnosis not present

## 2019-07-08 DIAGNOSIS — E1140 Type 2 diabetes mellitus with diabetic neuropathy, unspecified: Secondary | ICD-10-CM

## 2019-07-08 DIAGNOSIS — I129 Hypertensive chronic kidney disease with stage 1 through stage 4 chronic kidney disease, or unspecified chronic kidney disease: Secondary | ICD-10-CM

## 2019-07-08 NOTE — Patient Instructions (Signed)
Thank you allowing the Chronic Care Management Team to be a part of your care! It was a pleasure speaking with you today!     CCM (Chronic Care Management) Team    Noreene Larsson RN, MSN, CCM Nurse Care Coordinator  [phone]   Harlow Asa PharmD  Clinical Pharmacist  [phone]   Eula Fried LCSW Clinical Social Worker [phone]  Visit Information  Goals Addressed            This Visit's Progress   . PharmD - "I'm in the donut hole" (pt-stated)       Current Barriers:  . Financial Barriers: patient has [place] MedicareRx Preferred Part D insurance and reports copay for Tresiba, Humalog, Bystolic and Colcrys is cost prohibitive at this time  Pharmacist Clinical Goal(s):  Marland Kitchen Over the next 30 days, patient will work with PharmD and providers to relieve medication access concerns  Interventions: . Care coordination call today to [employer] [phone]) in regard to patient's re-enrollment application for Tresiba. o Speak to [place] who states that application is currently pending in system. Reviews application during call and patient is approved for the program, effective 07/08/2019-06/10/2020. Medication to be shipped to provider's office in 10-14 business days . Care coordination call today to Bernita Buffy [phone]) in regard to patient's re-enrollment application for Colcrys. o Speak to [place] who states that status is still in processing.  o Advises that she will move application forward.  o Advises patient should expect to receive medication by end of next week and, if not for patient to call back then. . Follow up call to Ms. Door to update her regarding patient assistance applications statuses and confirm that she has phone numbers for each program o Advise patient to follow up with Bernita Buffy assistance program if has not received medication by end of next week . Address patient's general questions about COVID-19 vaccination . Ms. Beadle reports that  her initial visit with [facility] currently scheduled for 3/17. Reports received message about provider wanting to reschedule appointment for an earlier date and that she is waiting on call back from the clinic. Marland Kitchen Patient reports improvement in BP control with discontinuation of HCTZ  o Reports taking Bystolic 10 mg once daily as directed by Cardiologist o Reports recent BP readings running ~140/85 . Counsel on importance of blood sugar control and monitoring o Reports taking Tresiba 56 units daily o Humalog on a sliding scale o Reports recent fasting CBGs running ~120-150s . Discuss with patient benefits of LDL lowering with statins for reduced cardiovascular risk o Reports taking rosuvastatin 10 mg once daily as directed  Patient Self Care Activities:  . Patient will attend medical appointments as scheduled o Next appointment with Cardiology scheduled for 2/1  Please see past updates related to this goal by clicking on the "Past Updates" button in the selected goal         The patient verbalized understanding of instructions provided today and declined a print copy of patient instruction materials.   The care management team will reach out to the patient again over the next 30 days.   Harlow Asa, PharmD, [place] [employer] [phone]

## 2019-07-08 NOTE — Chronic Care Management (AMB) (Signed)
Chronic Care Management   Follow Up Note   07/08/2019 Name: [name] MRN: [id number] DOB: 27-Nov-1945  Referred by: Mikey College, NP (Inactive) Reason for referral : Chronic Care Management (Patient Phone Call) and Care Coordination (Patient Assistance Programs)   [name] is a 74 y.o. year old female who is a primary care patient of Mikey College, NP (Inactive). The CCM team was consulted for assistance with chronic disease management and care coordination needs.    Coordination of care calls to [employer] and [place] patient assistance programs today.  I reached out to [name] by phone today.   Review of patient status, including review of consultants reports, relevant laboratory and other test results, and collaboration with appropriate care team members and the patient's provider was performed as part of comprehensive patient evaluation and provision of chronic care management services.     Outpatient Encounter Medications as of 07/08/2019  Medication Sig  . famotidine (PEPCID) 20 MG tablet Take 20 mg by mouth 2 (two) times daily.  . Insulin Degludec (TRESIBA FLEXTOUCH) 200 UNIT/ML SOPN Inject 56 Units into the skin daily.  . insulin lispro (HUMALOG) 100 UNIT/ML injection Correction scale: take 1 unit per 50 over 150 before meals up to three times daily.  Up to 20 units per day.  . nebivolol (BYSTOLIC) 10 MG tablet Take 1 tablet (10 mg total) by mouth daily.  . rosuvastatin (CRESTOR) 10 MG tablet TAKE 1 TABLET BY MOUTH EVERY DAY  . acetaminophen (TYLENOL) 500 MG tablet Take 500 mg by mouth daily as needed.  . ARTIFICIAL TEAR OP Apply to eye.  . BD INSULIN SYRINGE U/F 31G X 5/16" 1 ML MISC USE AS DIRECTED. WITH HUMALOG  . Cholecalciferol (VITAMIN D3) 25 MCG (1000 UT) CAPS Take 4 capsules by mouth daily.   . clindamycin (CLEOCIN) 300 MG capsule Take 600 mg by mouth. 1 Hour before dental procedures  . COLCRYS 0.6 MG tablet Take 1  tablet (0.6 mg total) by mouth 2 (two) times daily as needed. For up to 3 days for pericarditis, or up to 7 days for gout or connective tissue disorder.  . cyclobenzaprine (FLEXERIL) 5 MG tablet TAKE 1 TABLET (5 MG TOTAL) BY MOUTH ONCE DAILY AS NEEDED FOR MUSCLE SPASMS  . glucose blood (FREESTYLE LITE) test strip USE AS DIRECTED THREE TIMES DAILY FOR DIABETES  . Insulin Pen Needle (PEN NEEDLES) 32G X 5 MM MISC 1 Device by Does not apply route daily.  Marland Kitchen lactase (LACTAID) 3000 units tablet Take by mouth as needed.   . Lancets (FREESTYLE) lancets Must test x4/day  . Nutritional Supplements (NUTRITIONAL SUPPLEMENT PO) Take by mouth. "Blood Boost Formula" by [employer]  . prednisoLONE acetate (PRED FORTE) 1 % ophthalmic suspension INSTILL ONE DROP  as needed   No facility-administered encounter medications on file as of 07/08/2019.    Goals Addressed            This Visit's Progress   . PharmD - "I'm in the donut hole" (pt-stated)       Current Barriers:  . Financial Barriers: patient has [place] MedicareRx Preferred Part D insurance and reports copay for Tresiba, Humalog, Bystolic and Colcrys is cost prohibitive at this time  Pharmacist Clinical Goal(s):  Marland Kitchen Over the next 30 days, patient will work with PharmD and providers to relieve medication access concerns  Interventions: . Care coordination call today to [employer] [phone]) in regard to patient's re-enrollment application for  [name] Speak to [name] who states that application is currently pending in system. Reviews application during call and patient is approved for the program, effective 07/08/2019-06/10/2020. Medication to be shipped to provider's office in 10-14 business days . Care coordination call today to [name] [phone]) in regard to patient's re-enrollment application for Colcrys. o Speak to [name] who states that status is still in processing.  o Advises that she will move application forward.   o Advises patient should expect to receive medication by end of next week and, if not for patient to call back then. . Follow up call to [name] to update her regarding patient assistance applications statuses and confirm that she has phone numbers for each program o Advise patient to follow up with [name] assistance program if has not received medication by end of next week . Address patient's general questions about COVID-19 vaccination . [name] reports that her initial visit with [facility] currently scheduled for 3/17. Reports received message about provider wanting to reschedule appointment for an earlier date and that she is waiting on call back from the clinic. Marland Kitchen Patient reports improvement in BP control with discontinuation of HCTZ  o Reports taking Bystolic 10 mg once daily as directed by Cardiologist o Reports recent BP readings running ~140/85 . Counsel on importance of blood sugar control and monitoring o Reports taking Tresiba 56 units daily o Humalog on a sliding scale o Reports recent fasting CBGs running ~120-150s . Discuss with patient benefits of LDL lowering with statins for reduced cardiovascular risk o Reports taking rosuvastatin 10 mg once daily as directed  Patient Self Care Activities:  . Patient will attend medical appointments as scheduled o Next appointment with Cardiology scheduled for 2/1  Please see past updates related to this goal by clicking on the "Past Updates" button in the selected goal         Plan  The care management team will reach out to the patient again over the next 30 days.   Harlow Asa, PharmD, [place] [employer] [phone]

## 2019-07-10 NOTE — Progress Notes (Signed)
Cardiology Office Note  Date:  07/13/2019   ID:  [name], [name] 06-15-1945, MRN [id number]  PCP:  Mikey College, NP (Inactive)   Chief Complaint  Patient presents with  . OTHER    1 month f/u c/o sob and fluid retention believes it's coming from her insulin . Meds reviewed verbally with pt.    HPI:  [name] is a 74 year old woman with past medical history of Former smoker  PAD/carotid HTN urinary incontinence, chronic UTIs Morbid obesity Moderate to large hiatal hernia. 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  EKG personally reviewed by myself on todays visit NSR rate 63 bpm, no ST or T wave changes   Other past medical hx  long history of pericarditis, pericardial effusion Previously managed by [place] being treated with colchicine in the past, Also previously treated with long course of steroids for unspecified connective tissue disorder  CT scan 06/2018 Mild to moderate diffuse descending aortic atherosclerosis extending into the common iliac arteries Coronary calcification in the right coronary artery  Carotid u/s 05/2018 Less than 50% stenosis in the right and left internal carotid Arteries.   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  PSH:    Past Surgical History:  Procedure Laterality Date  . ABDOMINAL HYSTERECTOMY    . BREAST BIOPSY    . BREAST EXCISIONAL BIOPSY Left 1986  . CHOLECYSTECTOMY    . COLONOSCOPY WITH PROPOFOL N/A 08/26/2018   Procedure: COLONOSCOPY WITH PROPOFOL;  Surgeon: Lucilla Lame, MD;  Location: [facility] ENDOSCOPY;  Service: Endoscopy;  Laterality: N/A;  . CYSTOSCOPY  02/26/2018   [name], MD   . distal arthrectomy    . ESOPHAGOGASTRODUODENOSCOPY (EGD) WITH PROPOFOL N/A 08/26/2018   Procedure: ESOPHAGOGASTRODUODENOSCOPY (EGD) WITH PROPOFOL;  Surgeon: Lucilla Lame, MD;  Location: [facility] ENDOSCOPY;  Service: Endoscopy;  Laterality: N/A;  . EXCISION NEUROMA    . KNEE SURGERY Bilateral    1985, 2001, 11/2002, 12/2006  . panhysterectomy  10/1983  . SHOULDER SURGERY Right    reverse total arthroplasty w/ biceps tenodesis  . TONSILLECTOMY AND ADENOIDECTOMY    . TOTAL HIP ARTHROPLASTY Right 04/2007  . WISDOM TOOTH EXTRACTION      Current Outpatient Medications  Medication Sig Dispense Refill  . acetaminophen (TYLENOL) 500 MG tablet Take 500 mg by mouth daily as needed.    . ARTIFICIAL TEAR OP Apply to eye.    . BD INSULIN SYRINGE U/F 31G X 5/16" 1 ML MISC USE AS DIRECTED. WITH HUMALOG 100 each 5  . BIOTIN PO Take by mouth daily.    . Cholecalciferol (VITAMIN D3) 25 MCG (1000 UT) CAPS Take 4 capsules by mouth daily.     Marland Kitchen  clindamycin (CLEOCIN) 300 MG capsule Take 600 mg by mouth. 1 Hour before dental procedures    . COLCRYS 0.6 MG tablet Take 1 tablet (0.6 mg total) by mouth 2 (two) times daily as needed. For up to 3 days for pericarditis, or up to 7 days for gout or connective tissue disorder. 60 tablet 2  . cyclobenzaprine  (FLEXERIL) 5 MG tablet TAKE 1 TABLET (5 MG TOTAL) BY MOUTH ONCE DAILY AS NEEDED FOR MUSCLE SPASMS 30 tablet 2  . famotidine (PEPCID) 20 MG tablet Take 20 mg by mouth 2 (two) times daily.    Marland Kitchen glucose blood (FREESTYLE LITE) test strip USE AS DIRECTED THREE TIMES DAILY FOR DIABETES 100 strip 4  . Insulin Degludec (TRESIBA FLEXTOUCH) 200 UNIT/ML SOPN Inject 56 Units into the skin daily. 3 pen 11  . insulin lispro (HUMALOG) 100 UNIT/ML injection Correction scale: take 1 unit per 50 over 150 before meals up to three times daily.  Up to 20 units per day. 20 mL 1  . Insulin Pen Needle (PEN NEEDLES) 32G X 5 MM MISC 1 Device by Does not apply route daily. 100 each 3  . lactase (LACTAID) 3000 units tablet Take by mouth as needed.     . Lancets (FREESTYLE) lancets Must test x4/day    . nebivolol (BYSTOLIC) 10 MG tablet Take 1 tablet (10 mg total) by mouth daily. 30 tablet 5  . Nutritional Supplements (NUTRITIONAL SUPPLEMENT PO) Take by mouth. "Blood Boost Formula" by [employer]    . prednisoLONE acetate (PRED FORTE) 1 % ophthalmic suspension INSTILL ONE DROP  as needed    . rosuvastatin (CRESTOR) 10 MG tablet TAKE 1 TABLET BY MOUTH EVERY DAY 30 tablet 5   No current facility-administered medications for this visit.     Allergies:   Amlodipine, Aspirin, Bee venom, Ciprofloxacin, Codeine, Erythromycin, Glimepiride, Hydralazine, Hydrochlorothiazide, Hydrocodone, Hydrocodone-acetaminophen, Hydromorphone, Irbesartan, Irbesartan-hydrochlorothiazide, Lisinopril, Maxitrol [neomycin-polymyxin-dexameth], Metformin and related, Metformin hcl, Methylcellulose, Metoclopramide, Metoprolol, Neomycin-bacitracin zn-polymyx, Norvasc [amlodipine besylate], Nsaids, Omeprazole magnesium, Other, Oxycodone-acetaminophen, Penicillins, Pioglitazone, Poison sumac extract, Prilosec [omeprazole], [place] sulfadiazine, Sulfa antibiotics, Tape, Tetanus toxoid, Tramadol, and Betadine [povidone iodine]   Social History:  The patient   reports that she quit smoking about 23 years ago. She has never used smokeless tobacco. She reports current alcohol use. She reports that she does not use drugs.   Family History:   family history is not on file.    Review of Systems: Review of Systems  Constitutional: Negative.        ABD bloating  HENT: Negative.   Respiratory: Negative.   Cardiovascular: Negative.   Gastrointestinal: Positive for abdominal pain and nausea.  Musculoskeletal: Negative.   Neurological: Negative.   Psychiatric/Behavioral: Negative.   All other systems reviewed and are negative.   PHYSICAL EXAM: VS:  BP (!) 156/78 (BP Location: Left Arm, Patient Position: Sitting, Cuff Size: Normal)   Pulse 64   Ht 5\' 9"  (1.753 m)   Wt 242 lb 12 oz (110.1 kg)   SpO2 98%   BMI 35.85 kg/m  , BMI Body mass index is 35.85 kg/m.  Constitutional:  oriented to person, place, and time. No distress.  Obese HENT:  Head: Grossly normal Eyes:  no discharge. No scleral icterus.  Neck: No JVD, no carotid bruits  Cardiovascular: Regular rate and rhythm, no murmurs appreciated Pulmonary/Chest: Clear to auscultation bilaterally, no wheezes or rails Abdominal: Soft.  no distension.  no tenderness.  Musculoskeletal: Normal range of motion Neurological:  normal muscle  tone. Coordination normal. No atrophy Skin: Skin warm and dry Psychiatric: normal affect, pleasant   Recent Labs: 02/10/2019: ALT 15; BUN 13; Creat 0.99; Hemoglobin 14.8; Platelets 319; Potassium 5.0; Sodium 141    Lipid Panel Lab Results  Component Value Date   CHOL 169 02/10/2019   HDL 46 (L) 02/10/2019   LDLCALC 100 (H) 02/10/2019   TRIG 129 02/10/2019    Wt Readings from Last 3 Encounters:  07/13/19 242 lb 12 oz (110.1 kg)  06/11/19 240 lb (108.9 kg)  05/28/19 235 lb (106.6 kg)      ASSESSMENT AND PLAN:  Aortic atherosclerosis ([place]) Recommend she stay on Crestor 10 mg daily She does not want higher dose We will add Zetia 10 mg daily for  goal LDL less than 70  PAD (peripheral artery disease) (HCC) aortic atherosclerosis, carotid calcification Add Zetia as above  Essential hypertension Reports pressures relatively well controlled at home Given extensive list of medication intolerances, no changes made  Pure hypercholesterolemia Crestor 10 with Zetia  Lymphedema - Reports that she uses her lymphedema compression pumps Recommend compression hose  Abdominal swelling Grossly no signs of CHF Reports sulfa allergy, no diuretics started Recommend low carbohydrate diet for weight loss She feels it is from her insulin  Disposition:   F/U  12 months  Long discussion with her concerning her numerous medical issues, Medication intolerances, work-up at [facility] Rheumatologic issues, endocrine issues  Total encounter time more than 45 minutes  Greater than 50% was spent in counseling and coordination of care with the patient    Orders Placed This Encounter  Procedures  . EKG 12-Lead     Signed, Esmond Plants, M.D., Ph.D. 07/13/2019  [place], [place]

## 2019-07-13 ENCOUNTER — Ambulatory Visit (INDEPENDENT_AMBULATORY_CARE_PROVIDER_SITE_OTHER): Payer: Medicare Other | Admitting: Cardiovascular Disease

## 2019-07-13 ENCOUNTER — Other Ambulatory Visit: Payer: Self-pay

## 2019-07-13 ENCOUNTER — Encounter: Payer: Self-pay | Admitting: Cardiovascular Disease

## 2019-07-13 VITALS — BP 156/78 | HR 64 | Ht 69.0 in | Wt 242.8 lb

## 2019-07-13 DIAGNOSIS — I739 Peripheral vascular disease, unspecified: Secondary | ICD-10-CM

## 2019-07-13 DIAGNOSIS — E782 Mixed hyperlipidemia: Secondary | ICD-10-CM

## 2019-07-13 DIAGNOSIS — I700 Atherosclerosis of aorta: Secondary | ICD-10-CM

## 2019-07-13 DIAGNOSIS — I890 Lymphedema, not elsewhere classified: Secondary | ICD-10-CM | POA: Diagnosis not present

## 2019-07-13 DIAGNOSIS — I10 Essential (primary) hypertension: Secondary | ICD-10-CM

## 2019-07-13 LAB — METANEPHRINES, URINE, 24 HOUR
Metaneph Total, Ur: 34 ug/L
Metanephrines, 24H Ur: 53 ug/24 hr (ref 36–209)
Normetanephrine, 24H Ur: 355 ug/24 hr (ref 131–612)
Normetanephrine, Ur: 229 ug/L
Total Volume: 1550

## 2019-07-13 MED ORDER — EZETIMIBE 10 MG PO TABS: 10.0000 mg | ORAL_TABLET | Freq: Every day | ORAL | 3 refills | Status: DC

## 2019-07-13 NOTE — Patient Instructions (Addendum)
Medication Instructions:  Please start zetia 10 mg daily for cholesterol  If you need a refill on your cardiac medications before your next appointment, please call your pharmacy.    Lab work: No new labs needed   If you have labs (blood work) drawn today and your tests are completely normal, you will receive your results only by: . MyChart Message (if you have MyChart) OR . A paper copy in the mail If you have any lab test that is abnormal or we need to change your treatment, we will call you to review the results.   Testing/Procedures: No new testing needed   Follow-Up: At [facility], you and your health needs are our priority.  As part of our continuing mission to provide you with exceptional heart care, we have created designated Provider Care Teams.  These Care Teams include your primary Cardiologist (physician) and Advanced Practice Providers (APPs -  Physician Assistants and Nurse Practitioners) who all work together to provide you with the care you need, when you need it.  . You will need a follow up appointment in 12 months   . Providers on your designated Care Team:   . Christopher Berge, NP . Ryan Dunn, PA-C . Jacquelyn Visser, PA-C  Any Other Special Instructions Will Be Listed Below (If Applicable).  For educational health videos Log in to : [email or web address] Or : [email or web address], password : [email or web address]   

## 2019-07-14 ENCOUNTER — Telehealth: Payer: Self-pay

## 2019-07-14 LAB — CATECHOLAMINES,UR.,FREE,24 HR
Dopamine, Rand Ur: 120 ug/L
Dopamine, Ur, 24Hr: 180 ug/24 hr (ref 0–510)
Epinephrine, Rand Ur: 2 ug/L
Epinephrine, U, 24Hr: 3 ug/24 hr (ref 0–20)
Norepinephrine, Rand Ur: 32 ug/L
Norepinephrine,U,24H: 48 ug/24 hr (ref 0–135)
Total Volume: 1500

## 2019-07-14 NOTE — Telephone Encounter (Signed)
Call to patient to discuss 24 hr urine results.   Pt verbalized understanding and had no further questions at this time.   Advised pt to call for any further questions or concerns.

## 2019-07-14 NOTE — Telephone Encounter (Signed)
-----   Message from Theora Gianotti, NP sent at 07/14/2019 10:03 AM EST ----- Nl 24 hr urine for metanephrines/catecholamines

## 2019-07-15 ENCOUNTER — Telehealth: Payer: Self-pay

## 2019-07-15 DIAGNOSIS — R928 Other abnormal and inconclusive findings on diagnostic imaging of breast: Secondary | ICD-10-CM

## 2019-07-15 DIAGNOSIS — N6320 Unspecified lump in the left breast, unspecified quadrant: Secondary | ICD-10-CM

## 2019-07-15 NOTE — Telephone Encounter (Signed)
Ordered diagnostic L mammo and [place]  Reviewed last diagnostic mammo/US result from 12/2018. Birads 3, probable benign 4 o clock L breast mass.  Nobie Putnam, DO [facility] 07/15/2019, 3:43 PM

## 2019-07-15 NOTE — Telephone Encounter (Signed)
The pt was notified that the order was placed for her mammogram and Ultrasound. She verbalize understanding.

## 2019-07-15 NOTE — Telephone Encounter (Addendum)
The pt called requesting that you place a order for diagnostic breast TOMO uni left and ultrasound. She had it done in 12/2019 and it was recommended that she repeat it 6 mths .

## 2019-07-27 ENCOUNTER — Telehealth: Payer: Self-pay | Admitting: Family Medicine

## 2019-07-27 ENCOUNTER — Other Ambulatory Visit: Payer: Self-pay

## 2019-07-27 DIAGNOSIS — R928 Other abnormal and inconclusive findings on diagnostic imaging of breast: Secondary | ICD-10-CM

## 2019-07-27 DIAGNOSIS — N6320 Unspecified lump in the left breast, unspecified quadrant: Secondary | ICD-10-CM

## 2019-07-27 NOTE — Telephone Encounter (Signed)
Re ordered diagnostic mammogram L unilateral  Nobie Putnam, DO [facility] 07/27/2019, 9:32 AM

## 2019-07-28 ENCOUNTER — Ambulatory Visit: Payer: Self-pay | Admitting: Pharmacist

## 2019-07-28 DIAGNOSIS — E1140 Type 2 diabetes mellitus with diabetic neuropathy, unspecified: Secondary | ICD-10-CM

## 2019-07-28 DIAGNOSIS — M109 Gout, unspecified: Secondary | ICD-10-CM

## 2019-07-28 NOTE — Chronic Care Management (AMB) (Signed)
Chronic Care Management   Follow Up Note   07/28/2019 Name: [name] MRN: [id number] DOB: 05-Dec-1945  Referred by: Patricia College, NP (Inactive) Reason for referral : Chronic Care Management (Patient Phone Message)   [name] is a 74 y.o. year old female who is a primary care patient of Patricia College, NP (Inactive). The CCM team was consulted for assistance with chronic disease management and care coordination needs.    Receive a voicemail from [name].  Was unable to reach patient via telephone today and have left HIPAA compliant voicemail asking patient to return my call.  Review of patient status, including review of consultants reports, relevant laboratory and other test results, and collaboration with appropriate care team members and the patient's provider was performed as part of comprehensive patient evaluation and provision of chronic care management services.     Outpatient Encounter Medications as of 07/28/2019  Medication Sig  . acetaminophen (TYLENOL) 500 MG tablet Take 500 mg by mouth daily as needed.  . ARTIFICIAL TEAR OP Apply to eye.  . BD INSULIN SYRINGE U/F 31G X 5/16" 1 ML MISC USE AS DIRECTED. WITH HUMALOG  . BIOTIN PO Take by mouth daily.  . Cholecalciferol (VITAMIN D3) 25 MCG (1000 UT) CAPS Take 4 capsules by mouth daily.   . clindamycin (CLEOCIN) 300 MG capsule Take 600 mg by mouth. 1 Hour before dental procedures  . COLCRYS 0.6 MG tablet Take 1 tablet (0.6 mg total) by mouth 2 (two) times daily as needed. For up to 3 days for pericarditis, or up to 7 days for gout or connective tissue disorder.  . cyclobenzaprine (FLEXERIL) 5 MG tablet TAKE 1 TABLET (5 MG TOTAL) BY MOUTH ONCE DAILY AS NEEDED FOR MUSCLE SPASMS  . ezetimibe (ZETIA) 10 MG tablet Take 1 tablet (10 mg total) by mouth daily.  . famotidine (PEPCID) 20 MG tablet Take 20 mg by mouth 2 (two) times daily.  Marland Kitchen glucose blood (FREESTYLE LITE) test strip USE AS DIRECTED  THREE TIMES DAILY FOR DIABETES  . Insulin Degludec (TRESIBA FLEXTOUCH) 200 UNIT/ML SOPN Inject 56 Units into the skin daily.  . insulin lispro (HUMALOG) 100 UNIT/ML injection Correction scale: take 1 unit per 50 over 150 before meals up to three times daily.  Up to 20 units per day.  . Insulin Pen Needle (PEN NEEDLES) 32G X 5 MM MISC 1 Device by Does not apply route daily.  Marland Kitchen lactase (LACTAID) 3000 units tablet Take by mouth as needed.   . Lancets (FREESTYLE) lancets Must test x4/day  . nebivolol (BYSTOLIC) 10 MG tablet Take 1 tablet (10 mg total) by mouth daily.  . Nutritional Supplements (NUTRITIONAL SUPPLEMENT PO) Take by mouth. "Blood Boost Formula" by [employer]  . prednisoLONE acetate (PRED FORTE) 1 % ophthalmic suspension INSTILL ONE DROP  as needed  . rosuvastatin (CRESTOR) 10 MG tablet TAKE 1 TABLET BY MOUTH EVERY DAY   No facility-administered encounter medications on file as of 07/28/2019.    Goals Addressed            This Visit's Progress   . PharmD - "I'm in the donut hole" (pt-stated)       Current Barriers:  . Financial Barriers: patient has [name] insurance and reports copay for Tresiba, Humalog, Bystolic and Colcrys is cost prohibitive at this time  Pharmacist Clinical Goal(s):  Marland Kitchen Over the next 30 days, patient will work with PharmD and providers to relieve medication  access concerns  Interventions: . Receive message from patient today regarding [employer] re-enrollment application for Colcrys. o Reports application approved and expecting to received medication in mail on 2/24 or 2/25. Marland Kitchen Return call to patient in order to review refill processes for patient assistance programs (patient currently enrolled with Bernita Buffy, [employer] and [employer]) o Unable to reach patient today  Patient Self Care Activities:  . Patient will attend medical appointments as scheduled  Please see past updates related to this goal by clicking on the "Past  Updates" button in the selected goal         Plan  The care management team will reach out to the patient again over the next 30 days.   Harlow Asa, PharmD, [place] [employer] [phone]

## 2019-07-28 NOTE — Patient Instructions (Signed)
Thank you allowing the Chronic Care Management Team to be a part of your care! It was a pleasure speaking with you today!     CCM (Chronic Care Management) Team    Noreene Larsson RN, MSN, CCM Nurse Care Coordinator  [phone]   Harlow Asa PharmD  Clinical Pharmacist  [phone]   Eula Fried LCSW Clinical Social Worker [phone]  Visit Information  Goals Addressed            This Visit's Progress   . PharmD - "I'm in the donut hole" (pt-stated)       Current Barriers:  . Financial Barriers: patient has [place] [employer] Preferred Part D insurance and reports copay for Tresiba, Humalog, Bystolic and Colcrys is cost prohibitive at this time  Pharmacist Clinical Goal(s):  Marland Kitchen Over the next 30 days, patient will work with PharmD and providers to relieve medication access concerns  Interventions: . Receive message from patient today regarding [employer] re-enrollment application for Colcrys. o Reports application approved and expecting to received medication in mail on 2/24 or 2/25. Marland Kitchen Return call to patient in order to review refill processes for patient assistance programs (patient currently enrolled with [employer], [employer] and [employer]) o Unable to reach patient today  Patient Self Care Activities:  . Patient will attend medical appointments as scheduled  Please see past updates related to this goal by clicking on the "Past Updates" button in the selected goal         The patient verbalized understanding of instructions provided today and declined a print copy of patient instruction materials.   The care management team will reach out to the patient again over the next 30 days.   Harlow Asa, PharmD, [place] [employer] [phone]

## 2019-07-29 ENCOUNTER — Telehealth: Payer: Self-pay

## 2019-07-29 DIAGNOSIS — N6320 Unspecified lump in the left breast, unspecified quadrant: Secondary | ICD-10-CM

## 2019-07-29 DIAGNOSIS — R928 Other abnormal and inconclusive findings on diagnostic imaging of breast: Secondary | ICD-10-CM

## 2019-07-29 NOTE — Telephone Encounter (Signed)
I received a call from the patient requesting that we place a order for her screening mammogram. She said she called [facility] to schedule her appt and was told she needed her order placed before she could schedule.   I contacted [facility] and I was informed that the patient is due a f/u diagnostic bilateral mammogram and Ultrasound. The ultrasound is already ordered. She just need the mammogram that I pend in this encounter placed. The pt will call and schedule the appt.

## 2019-08-04 ENCOUNTER — Ambulatory Visit (INDEPENDENT_AMBULATORY_CARE_PROVIDER_SITE_OTHER): Payer: Medicare Other | Admitting: Pharmacist

## 2019-08-04 DIAGNOSIS — E1140 Type 2 diabetes mellitus with diabetic neuropathy, unspecified: Secondary | ICD-10-CM | POA: Diagnosis not present

## 2019-08-04 DIAGNOSIS — E782 Mixed hyperlipidemia: Secondary | ICD-10-CM | POA: Diagnosis not present

## 2019-08-04 NOTE — Chronic Care Management (AMB) (Signed)
Chronic Care Management   Follow Up Note   08/04/2019 Name: Monday Amthor MRN: [id number] DOB: 10-Oct-1945  Referred by: Mikey College, NP (Inactive) Reason for referral : Chronic Care Management (Patient Phone Call)   Dillynn Schullo is a 74 y.o. year old female who is a primary care patient of Mikey College, NP (Inactive). The CCM team was consulted for assistance with chronic disease management and care coordination needs.    I reached out to [name] by phone today.   Review of patient status, including review of consultants reports, relevant laboratory and other test results, and collaboration with appropriate care team members and the patient's provider was performed as part of comprehensive patient evaluation and provision of chronic care management services.     Outpatient Encounter Medications as of 08/04/2019  Medication Sig  . acetaminophen (TYLENOL) 500 MG tablet Take 500 mg by mouth daily as needed.  . ARTIFICIAL TEAR OP Apply to eye.  . BD INSULIN SYRINGE U/F 31G X 5/16" 1 ML MISC USE AS DIRECTED. WITH HUMALOG  . BIOTIN PO Take by mouth daily.  . Cholecalciferol (VITAMIN D3) 25 MCG (1000 UT) CAPS Take 4 capsules by mouth daily.   . clindamycin (CLEOCIN) 300 MG capsule Take 600 mg by mouth. 1 Hour before dental procedures  . COLCRYS 0.6 MG tablet Take 1 tablet (0.6 mg total) by mouth 2 (two) times daily as needed. For up to 3 days for pericarditis, or up to 7 days for gout or connective tissue disorder.  . cyclobenzaprine (FLEXERIL) 5 MG tablet TAKE 1 TABLET (5 MG TOTAL) BY MOUTH ONCE DAILY AS NEEDED FOR MUSCLE SPASMS  . ezetimibe (ZETIA) 10 MG tablet Take 1 tablet (10 mg total) by mouth daily.  . famotidine (PEPCID) 20 MG tablet Take 20 mg by mouth 2 (two) times daily.  Marland Kitchen glucose blood (FREESTYLE LITE) test strip USE AS DIRECTED THREE TIMES DAILY FOR DIABETES  . Insulin Degludec (TRESIBA FLEXTOUCH) 200 UNIT/ML SOPN Inject 56 Units into  the skin daily.  . insulin lispro (HUMALOG) 100 UNIT/ML injection Correction scale: take 1 unit per 50 over 150 before meals up to three times daily.  Up to 20 units per day.  . Insulin Pen Needle (PEN NEEDLES) 32G X 5 MM MISC 1 Device by Does not apply route daily.  Marland Kitchen lactase (LACTAID) 3000 units tablet Take by mouth as needed.   . Lancets (FREESTYLE) lancets Must test x4/day  . nebivolol (BYSTOLIC) 10 MG tablet Take 1 tablet (10 mg total) by mouth daily.  . Nutritional Supplements (NUTRITIONAL SUPPLEMENT PO) Take by mouth. "Blood Boost Formula" by [employer]  . prednisoLONE acetate (PRED FORTE) 1 % ophthalmic suspension INSTILL ONE DROP  as needed  . rosuvastatin (CRESTOR) 10 MG tablet TAKE 1 TABLET BY MOUTH EVERY DAY   No facility-administered encounter medications on file as of 08/04/2019.    Goals Addressed            This Visit's Progress   . PharmD - "I'm in the donut hole" (pt-stated)       Current Barriers:  . Financial Barriers: patient has [place] [employer] Preferred Part D insurance and reports copay for Tresiba, Humalog, Bystolic and Colcrys is cost prohibitive at this time  Pharmacist Clinical Goal(s):  Marland Kitchen Over the next 30 days, patient will work with PharmD and providers to relieve medication access concerns  Interventions: . Counsel on health plan Part D benefit and cost savings opportunities  o Counsel on [employer] for cost savings benefit. Patient expresses interest in using mail order at this time. Provide patient with phone number . Counsel on refill processes for patient assistance programs (patient currently enrolled with Doristine Johns and [employer]) for 2021 calendar year . Counsel on importance of cholesterol lowering for reduction of cardiovascular risk and on mechanism of action of medications to lower LDL o Patient reports recently started on Zetia 10 mg once daily by Cardiologist to take in addition to rosuvastatin. . Discuss COVID-19 vaccination and  continued importance of COVID-19 precautions o Ms. Staton plans to discuss vaccination with Rheumatologist at upcoming appointment . Ms. Krauter denies further medication questions/concerns at this time.  Patient Self Care Activities:  . Patient will attend medical appointments as scheduled o Next appointment with PCP on 3/8 o Initial appointment with [facility] on 3/10  Please see past updates related to this goal by clicking on the "Past Updates" button in the selected goal         Plan 1) Ms. Pulliam denies any further medication question/concerns at this time. 2) The patient has been provided with contact information for CM Pharmacist and has been advised to call with any medication related questions or concerns.   Harlow Asa, PharmD, [place] [employer] [phone]

## 2019-08-04 NOTE — Patient Instructions (Signed)
Thank you allowing the Chronic Care Management Team to be a part of your care! It was a pleasure speaking with you today!     CCM (Chronic Care Management) Team    Noreene Larsson RN, MSN, CCM Nurse Care Coordinator  [phone]   Harlow Asa PharmD  Clinical Pharmacist  [phone]   Eula Fried LCSW Clinical Social Worker [phone]  Visit Information  Goals Addressed            This Visit's Progress   . PharmD - "I'm in the donut hole" (pt-stated)       Current Barriers:  . Financial Barriers: patient has [place] [employer] Preferred Part D insurance and reports copay for Tresiba, Humalog, Bystolic and Colcrys is cost prohibitive at this time  Pharmacist Clinical Goal(s):  Marland Kitchen Over the next 30 days, patient will work with PharmD and providers to relieve medication access concerns  Interventions: . Counsel on health plan Part D benefit and cost savings opportunities o Counsel on [employer] for cost savings benefit. Patient expresses interest in using mail order at this time. Provide patient with phone number . Counsel on refill processes for patient assistance programs (patient currently enrolled with Doristine Johns and [employer]) for 2021 calendar year . Counsel on importance of cholesterol lowering for reduction of cardiovascular risk and on mechanism of action of medications to lower LDL o Patient reports recently started on Zetia 10 mg once daily by Cardiologist to take in addition to rosuvastatin. . Discuss COVID-19 vaccination and continued importance of COVID-19 precautions o Ms. Staton plans to discuss vaccination with Rheumatologist at upcoming appointment . Ms. Mckendall denies further medication questions/concerns at this time.  Patient Self Care Activities:  . Patient will attend medical appointments as scheduled o Next appointment with PCP on 3/8 o Initial appointment with [facility] on 3/10  Please see past updates related to this goal by  clicking on the "Past Updates" button in the selected goal         Patient verbalizes understanding of instructions provided today.    The patient has been provided with contact information for the care management team and has been advised to call with any health related questions or concerns.   Harlow Asa, PharmD, [place] [employer] [phone]

## 2019-08-10 ENCOUNTER — Other Ambulatory Visit: Payer: Self-pay | Admitting: Family Medicine

## 2019-08-10 ENCOUNTER — Telehealth: Payer: Self-pay | Admitting: Cardiovascular Disease

## 2019-08-10 DIAGNOSIS — E7800 Pure hypercholesterolemia, unspecified: Secondary | ICD-10-CM

## 2019-08-10 MED ORDER — EZETIMIBE 10 MG PO TABS: 10.0000 mg | ORAL_TABLET | Freq: Every day | ORAL | 3 refills | Status: DC

## 2019-08-10 NOTE — Telephone Encounter (Signed)
Spoke with patient and reviewed that if she goes to [facility] and requests this medication to be processed with no insurance and use the Good Rx savings card she can get 90 day supply for $21.21. She was agreeable with this price and requested that I please send it there.

## 2019-08-10 NOTE — Telephone Encounter (Signed)
Patient calling in to let Dr. Rockey Situ know that she has found the pharmacy she can get her medication the cheapest.  A non-formulary exception order will needed to be faxed to [facility] [phone] for the generic of zetia10 mg  Please advise as appropriate

## 2019-08-17 ENCOUNTER — Ambulatory Visit: Payer: Medicare Other | Admitting: Family Medicine

## 2019-08-19 ENCOUNTER — Other Ambulatory Visit: Payer: PRIVATE HEALTH INSURANCE

## 2019-08-19 DIAGNOSIS — I776 Arteritis, unspecified: Secondary | ICD-10-CM | POA: Diagnosis not present

## 2019-08-25 ENCOUNTER — Ambulatory Visit
Admission: RE | Admit: 2019-08-25 | Discharge: 2019-08-25 | Disposition: A | Discharge status: Home / Self Care | Payer: Medicare Other | Source: Ambulatory Visit | Attending: Family Medicine | Admitting: Family Medicine

## 2019-08-25 ENCOUNTER — Encounter (INDEPENDENT_AMBULATORY_CARE_PROVIDER_SITE_OTHER): Payer: Self-pay | Admitting: Vascular Surgery

## 2019-08-25 ENCOUNTER — Ambulatory Visit (INDEPENDENT_AMBULATORY_CARE_PROVIDER_SITE_OTHER): Payer: Medicare Other | Admitting: Vascular Surgery

## 2019-08-25 ENCOUNTER — Other Ambulatory Visit: Payer: Self-pay

## 2019-08-25 VITALS — BP 167/77 | HR 70 | Resp 16 | Wt 242.0 lb

## 2019-08-25 DIAGNOSIS — R928 Other abnormal and inconclusive findings on diagnostic imaging of breast: Secondary | ICD-10-CM

## 2019-08-25 DIAGNOSIS — N6320 Unspecified lump in the left breast, unspecified quadrant: Secondary | ICD-10-CM

## 2019-08-25 DIAGNOSIS — R922 Inconclusive mammogram: Secondary | ICD-10-CM | POA: Diagnosis not present

## 2019-08-25 DIAGNOSIS — N183 Chronic kidney disease, stage 3 unspecified: Secondary | ICD-10-CM

## 2019-08-25 DIAGNOSIS — N6323 Unspecified lump in the left breast, lower outer quadrant: Secondary | ICD-10-CM | POA: Diagnosis not present

## 2019-08-25 DIAGNOSIS — E782 Mixed hyperlipidemia: Secondary | ICD-10-CM | POA: Diagnosis not present

## 2019-08-25 DIAGNOSIS — I129 Hypertensive chronic kidney disease with stage 1 through stage 4 chronic kidney disease, or unspecified chronic kidney disease: Secondary | ICD-10-CM

## 2019-08-25 DIAGNOSIS — I890 Lymphedema, not elsewhere classified: Secondary | ICD-10-CM | POA: Diagnosis not present

## 2019-08-25 NOTE — Patient Instructions (Signed)

## 2019-08-25 NOTE — Progress Notes (Signed)
MRN : [id number]  Raffinee Fixler is a 74 y.o. (05-Jun-1946) female who presents with chief complaint of  Chief Complaint  Patient presents with  . Follow-up    18month follow up  .  History of Present Illness: Patient returns today in follow up of her lymphedema.  Her legs are doing reasonably well.  Continues to have some abdominal bloating.  Her mild leg swelling has been reasonably well controlled with her lymphedema pump.  Compression stockings have not been tolerable to her, so her swelling does progress throughout the day.  No new ulceration or infection.  She has a small ulcer on the base of the left third toe which is clean and granulating.  No signs of infection.  Current Outpatient Medications  Medication Sig Dispense Refill  . acetaminophen (TYLENOL) 500 MG tablet Take 500 mg by mouth daily as needed.    . ARTIFICIAL TEAR OP Apply to eye.    . BD INSULIN SYRINGE U/F 31G X 5/16" 1 ML MISC USE AS DIRECTED. WITH HUMALOG 100 each 5  . BIOTIN PO Take by mouth daily.    . Cholecalciferol (VITAMIN D3) 25 MCG (1000 UT) CAPS Take 4 capsules by mouth daily.     . clindamycin (CLEOCIN) 300 MG capsule Take 600 mg by mouth. 1 Hour before dental procedures    . COLCRYS 0.6 MG tablet Take 1 tablet (0.6 mg total) by mouth 2 (two) times daily as needed. For up to 3 days for pericarditis, or up to 7 days for gout or connective tissue disorder. 60 tablet 2  . cyclobenzaprine (FLEXERIL) 5 MG tablet TAKE 1 TABLET (5 MG TOTAL) BY MOUTH ONCE DAILY AS NEEDED FOR MUSCLE SPASMS 30 tablet 2  . ezetimibe (ZETIA) 10 MG tablet Take 1 tablet (10 mg total) by mouth daily. 90 tablet 3  . famotidine (PEPCID) 20 MG tablet Take 20 mg by mouth 2 (two) times daily.    Marland Kitchen glucose blood (FREESTYLE LITE) test strip USE AS DIRECTED THREE TIMES DAILY FOR DIABETES 100 strip 4  . Insulin Degludec (TRESIBA FLEXTOUCH) 200 UNIT/ML SOPN Inject 56 Units into the skin daily. 3 pen 11  . insulin lispro (HUMALOG) 100 UNIT/ML  injection Correction scale: take 1 unit per 50 over 150 before meals up to three times daily.  Up to 20 units per day. 20 mL 1  . Insulin Pen Needle (PEN NEEDLES) 32G X 5 MM MISC 1 Device by Does not apply route daily. 100 each 3  . lactase (LACTAID) 3000 units tablet Take by mouth as needed.     . Lancets (FREESTYLE) lancets Must test x4/day    . nebivolol (BYSTOLIC) 10 MG tablet Take 1 tablet (10 mg total) by mouth daily. 30 tablet 5  . Nutritional Supplements (NUTRITIONAL SUPPLEMENT PO) Take by mouth. "Blood Boost Formula" by [employer]    . prednisoLONE acetate (PRED FORTE) 1 % ophthalmic suspension INSTILL ONE DROP  as needed    . rosuvastatin (CRESTOR) 10 MG tablet Take 1 tablet (10 mg total) by mouth daily. 30 tablet 0   No current facility-administered medications for this visit.    Past Medical History:  Diagnosis Date  . Anterolisthesis    Cervical spine  . Asthma   . Connective tissue disorder (HCC)    recurrent carotid arteritis, temporal arteritis, vasculitis mandible, general hepatitis, avascular necrosis bilat  . DDD (degenerative disc disease), lumbar   . Diabetes mellitus without complication ([place])   . Diverticulosis   .  Duodenitis   . Gouty arthritis   . Hiatal hernia with GERD   . History of cardiovascular stress test    a. 07/2018 [facility]): Fixed inferoapical defect w/ nl contraction-->attenuation artifact. No ischemia. EF 63%.  . Hyperlipidemia   . Hypertension    a. 02/2019 Renal artery duplex: no evidence of prox RAS.  Marland Kitchen Hyperuricemia   . Keratitis sicca, bilateral ([place])   . Lymphedema of both lower extremities   . Osteoarthritis   . Pericarditis    Recurrent: Aug 97, July 05, Sept 08, July 16, July 18  . PUD (peptic ulcer disease)   . 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[place])   . Sjogren's syndrome ([place])   . Syncope   . Tendonitis, Achilles, right   . Tortuous colon   . Trigger finger     Past Surgical History:  Procedure Laterality Date  . ABDOMINAL  HYSTERECTOMY    . BREAST BIOPSY    . BREAST EXCISIONAL BIOPSY Left 1986   neg  . CHOLECYSTECTOMY    . COLONOSCOPY WITH PROPOFOL N/A 08/26/2018   Procedure: COLONOSCOPY WITH PROPOFOL;  Surgeon: Lucilla Lame, MD;  [facility] ENDOSCOPY;  Service: Endoscopy;  Laterality: N/A;  . CYSTOSCOPY  02/26/2018   [name], MD   . distal arthrectomy    . ESOPHAGOGASTRODUODENOSCOPY (EGD) WITH PROPOFOL N/A 08/26/2018   Procedure: ESOPHAGOGASTRODUODENOSCOPY (EGD) WITH PROPOFOL;  Surgeon: Lucilla Lame, MD;  Location: [facility] ENDOSCOPY;  Service: Endoscopy;  Laterality: N/A;  . EXCISION NEUROMA    . KNEE SURGERY Bilateral    1985, 2001, 11/2002, 12/2006  . panhysterectomy  10/1983  . SHOULDER SURGERY Right    reverse total arthroplasty w/ biceps tenodesis  . TONSILLECTOMY AND ADENOIDECTOMY    . TOTAL HIP ARTHROPLASTY Right 04/2007  . WISDOM TOOTH EXTRACTION       Social History   Tobacco Use  . Smoking status: Former Smoker    Quit date: 01/22/1996    Years since quitting: 23.6  . Smokeless tobacco: Never Used  Substance Use Topics  . Alcohol use: Yes    Comment: occasional  . Drug use: Never    Family History  Problem Relation Age of Onset  . Breast cancer Neg Hx   No bleeding or clotting disorders  Allergies  Allergen Reactions  . Amlodipine     Other reaction(s): Unknown  . Aspirin     Other reaction(s): Unknown  . Bee Venom   . Ciprofloxacin     Other reaction(s): Other (see comments), Unknown  . Codeine     Other reaction(s): Unknown Must have pre medications before taking per patient report Includes all derivatives   . Erythromycin     Other reaction(s): Unknown, Unknown  . Glimepiride     Other reaction(s): Unknown  . Hydralazine     Other reaction(s): Unknown  . Hydrochlorothiazide     Other reaction(s): Dizziness or lightheadedness.  . Hydrocodone     Other reaction(s): Unknown Must have pre medications before taking per patient report  .  Hydrocodone-Acetaminophen Nausea And Vomiting    MUST BE PREMEDICATED  . Hydromorphone Nausea And Vomiting    Other reaction(s): Unknown Must have pre medications before taking per patient report MUST BE PREMEDICATED   . Irbesartan     Other reaction(s): Unknown  . Irbesartan-Hydrochlorothiazide     Other reaction(s): Unknown  . Lisinopril     Other reaction(s): Unknown  . Maxitrol [Neomycin-Polymyxin-Dexameth]   . Metformin And Related   . Metformin Hcl  Other reaction(s): Unknown  . Methylcellulose     Other reaction(s): Unknown  . Metoclopramide Nausea And Vomiting    Other reaction(s): Unknown  . Metoprolol     Other reaction(s): Unknown  . Neomycin-Bacitracin Zn-Polymyx     Other reaction(s): Unknown  . Norvasc [Amlodipine Besylate]   . Nsaids Other (See Comments)    Other reaction(s): Unknown bleeding   . Omeprazole Magnesium     Other reaction(s): Unknown  . Other     Pt is allergic to staples and surgical metals  . Oxycodone-Acetaminophen     Other reaction(s): Unknown, Unknown Must have pre medications before taking per patient report   . Penicillins     Other reaction(s): Unknown, Unknown  . Pioglitazone     Other reaction(s): Unknown  . Poison [employer]     Poison [place] and [place] also  . Prilosec [Omeprazole]   . Silver Sulfadiazine     Other reaction(s): Unknown  . Sulfa Antibiotics     Other reaction(s): Unknown, Unknown  . Tape     Other reaction(s): Unknown  . Tetanus Toxoid Other (See Comments)  . Tramadol     Other reaction(s): Unknown Must have pre medications before taking per patient report  . Betadine [Povidone Iodine] Rash    Only topically when left on skin.    REVIEW OF SYSTEMS(Negative unless checked)  Constitutional: [] ??Weight loss[] ??Fever[] ??Chills Cardiac:[] ??Chest pain[] ??Chest pressure[] ??Palpitations [] ??Shortness of breath when laying flat [] ??Shortness of breath at rest [] ??Shortness of breath with  exertion. Vascular: [] ??Pain in legs with walking[] ??Pain in legsat rest[] ??Pain in legs when laying flat [] ??Claudication [] ??Pain in feet when walking [] ??Pain in feet at rest [] ??Pain in feet when laying flat [] ??History of DVT [] ??Phlebitis [x] ??Swelling in legs [] ??Varicose veins [] ??Non-healing ulcers Pulmonary: [] ??Uses home oxygen [] ??Productive cough[] ??Hemoptysis [] ??Wheeze [] ??COPD [] ??Asthma Neurologic: [] ??Dizziness [] ??Blackouts [] ??Seizures [] ??History of stroke [] ??History of TIA[] ??Aphasia [] ??Temporary blindness[] ??Dysphagia [] ??Weaknessor numbness in arms [] ??Weakness or numbnessin legs Musculoskeletal: [x] ??Arthritis [] ??Joint swelling [] ??Joint pain [x] ??Low back pain Hematologic:[] ??Easy bruising[] ??Easy bleeding [] ??Hypercoagulable state [] ??Anemic [] ??Hepatitis Gastrointestinal:[] ??Blood in stool[] ??Vomiting blood[x] ??Gastroesophageal reflux/heartburn[] ??Abdominal pain Genitourinary: [] ??Chronic kidney disease [] ??Difficulturination [] ??Frequenturination [] ??Burning with urination[] ??Hematuria Skin: [] ??Rashes [] ??Ulcers [] ??Wounds Psychological: [] ??History of anxiety[] ??History of major depression.  Physical Examination  BP (!) 167/77 (BP Location: Left Arm)   Pulse 70   Resp 16   Wt 242 lb (109.8 kg)   BMI 35.74 kg/m  Gen:  WD/WN, NAD Head: [place]/AT, No temporalis wasting. Ear/Nose/Throat: Hearing grossly intact, nares w/o erythema or drainage Eyes: Conjunctiva clear. Sclera non-icteric Neck: Supple.  Trachea midline Pulmonary:  Good air movement, no use of accessory muscles.  Cardiac: RRR, no JVD Vascular:  Vessel Right Left  Radial Palpable Palpable                          PT Palpable Palpable  DP Palpable Palpable    Musculoskeletal: M/S 5/5 throughout.  No deformity or atrophy.  1+ bilateral lower extremity edema. Neurologic: Sensation grossly  intact in extremities.  Symmetrical.  Speech is fluent.  Psychiatric: Judgment intact, Mood & affect appropriate for pt's clinical situation. Dermatologic: No rashes or ulcers noted.  No cellulitis or open wounds.       Labs Recent Results (from the past 2160 hour(s))  Urinalysis, Complete w Microscopic     Status: Abnormal   Collection Time: 05/28/19  4:58 PM  Result Value Ref Range   Color, Urine YELLOW YELLOW   APPearance CLEAR CLEAR   Specific Gravity, Urine 1.025 1.005 -  1.030   pH 5.0 5.0 - 8.0   Glucose, UA 100 (A) NEGATIVE mg/dL   Hgb urine dipstick NEGATIVE NEGATIVE   Bilirubin Urine SMALL (A) NEGATIVE   Ketones, ur NEGATIVE NEGATIVE mg/dL   Protein, ur NEGATIVE NEGATIVE mg/dL   Nitrite NEGATIVE NEGATIVE   Leukocytes,Ua NEGATIVE NEGATIVE   Squamous Epithelial / LPF 0-5 0 - 5   WBC, UA 0-5 0 - 5 WBC/hpf   RBC / HPF 0-5 0 - 5 RBC/hpf   Bacteria, UA RARE (A) NONE SEEN   Mucus PRESENT    Hyaline Casts, UA PRESENT     Comment: Performed at [facility], [address], [place], [place] [address]  Novel Coronavirus, NAA (Hosp order, Send-out to Ref Lab; TAT 18-24 hrs     Status: None   Collection Time: 05/28/19  5:54 PM   Specimen: Nasopharyngeal Swab; Respiratory  Result Value Ref Range   SARS-CoV-2, NAA NOT DETECTED NOT DETECTED    Comment: (NOTE) This nucleic acid amplification test was developed and its performance characteristics determined by [employer]. Nucleic acid amplification tests include PCR and TMA. This test has not been FDA cleared or approved. This test has been authorized by FDA under an Emergency Use Authorization (EUA). This test is only authorized for the duration of time the declaration that circumstances exist justifying the authorization of the emergency use of in vitro diagnostic tests for detection of SARS-CoV-2 virus and/or diagnosis of COVID-19 infection under section 564(b)(1) of the Act, 21 U.S.C. [id number]) (1),  unless the authorization is terminated or revoked sooner. When diagnostic testing is negative, the possibility of a false negative result should be considered in the context of a patient's recent exposures and the presence of clinical signs and symptoms consistent with COVID-19. An individual without symptoms of COVID- 19 and who is not shedding SARS-CoV-2 vi rus would expect to have a negative (not detected) result in this assay. Performed At: [facility] [address] [place], [place] [id number] Rush Farmer MD [id number]    Coronavirus Source NASOPHARYNGEAL     Comment: Performed at [facility], [address] [place], [place] [address]  Metanephrines, Urine, 24 hour     Status: None   Collection Time: 07/07/19  9:00 AM  Result Value Ref Range   Normetanephrine, Ur 229 Undefined ug/L    Comment: (NOTE) This test was developed and its performance characteristics determined by [facility]. It has not been cleared or approved by the Food and Drug Administration.    Normetanephrine, 24H Ur 355 131 - 612 ug/24 hr   Metaneph Total, Ur 34 Undefined ug/L    Comment: (NOTE) This test was developed and its performance characteristics determined by [facility]. It has not been cleared or approved by the Food and Drug Administration.    Metanephrines, 24H Ur 53 36 - 209 ug/24 hr    Comment: (NOTE) Performed At: [facility] [place], [place] [id number] Rush Farmer MD [id number]    Total Volume 1,550     Comment: Performed at [facility], [address] [place], [place] [address]  Catecholamines,Ur.,Free,24 Hh     Status: None   Collection Time: 07/07/19  9:00 AM  Result Value Ref Range   Epinephrine, Rand Ur 2 Undefined ug/L    Comment: (NOTE) This test was developed and its performance characteristics determined by [facility]. It has not been cleared or approved by the Food and Drug Administration.    Epinephrine, U,  24Hr 3 0 - 20 ug/24 hr   Norepinephrine, Rand Ur 32 Undefined ug/L    Comment: (NOTE) This test was developed and its performance characteristics determined by [facility]. It has not been cleared or approved by the Food and Drug Administration.    Norepinephrine,U,24H 48 0 - 135 ug/24 hr   Dopamine, Rand Ur 120 Undefined ug/L    Comment: (NOTE) This test was developed and its performance characteristics determined by [facility]. It has not been cleared or approved by the Food and Drug Administration.    Dopamine, Ur, 24Hr 180 0 - 510 ug/24 hr    Comment: (NOTE) Performed At: [facility] [place], [place] [id number] Rush Farmer MD [id number]    Total Volume 1,500     Comment: Performed at [facility], [address], [place], [place] [address]    Radiology No results found.  Assessment/Plan Benign essential HTN blood pressure control important in reducing the progression of atherosclerotic disease. On appropriate oral medications.  If she has significant renal artery stenosis, she said she would contact us and we will be happy to perform intervention for her renal artery disease   Hyperlipidemia lipid control important in reducing the progression of atherosclerotic disease. Continue statin therapy  Lymphedema Currently, the lymphedema pump seems to be a good therapy and keeps her lymphedema under reasonable control.  Elevation and activity are important since compression is not tolerable for the patient.  At this point, I think an annual visit or calling if her symptoms worsen is a reasonable option.    Leotis Pain, MD  08/25/2019 3:10 PM    This note was created with Dragon medical transcription system.  Any errors from dictation are purely unintentional

## 2019-08-25 NOTE — Assessment & Plan Note (Signed)
Currently, the lymphedema pump seems to be a good therapy and keeps her lymphedema under reasonable control.  Elevation and activity are important since compression is not tolerable for the patient.  At this point, I think an annual visit or calling if her symptoms worsen is a reasonable option.

## 2019-08-26 ENCOUNTER — Other Ambulatory Visit: Payer: Self-pay

## 2019-08-26 ENCOUNTER — Ambulatory Visit (INDEPENDENT_AMBULATORY_CARE_PROVIDER_SITE_OTHER): Payer: Medicare Other | Admitting: Pharmacist

## 2019-08-26 DIAGNOSIS — E1140 Type 2 diabetes mellitus with diabetic neuropathy, unspecified: Secondary | ICD-10-CM | POA: Diagnosis not present

## 2019-08-26 MED ORDER — PEN NEEDLES 32G X 5 MM MISC: 1.0000 | Freq: Every day | 3 refills | Status: AC

## 2019-08-26 NOTE — Patient Instructions (Signed)
Thank you allowing the Chronic Care Management Team to be a part of your care! It was a pleasure speaking with you today!     CCM (Chronic Care Management) Team    Noreene Larsson RN, MSN, CCM Nurse Care Coordinator  [phone]   Harlow Asa PharmD  Clinical Pharmacist  [phone]   Eula Fried LCSW Clinical Social Worker [phone]  Visit Information  Goals Addressed            This Visit's Progress   . PharmD - "I'm in the donut hole" (pt-stated)       Current Barriers:  . Financial Barriers: patient has [place] [employer] Preferred Part D insurance and reports copay for Tresiba, Humalog, Bystolic and Colcrys is cost prohibitive at this time  Pharmacist Clinical Goal(s):  Marland Kitchen Over the next 30 days, patient will work with PharmD and providers to relieve medication access concerns  Interventions: . Perform chart review - note patient has called office to request refill of insulin pen needles to use with Tresiba insulin o Rx sent by PCP to [facility] . F/u with patient regarding patient assistance program need o Patient reports will pick up pen needles from [facility] for current supply o CM Pharmacist will collaborate with office to request Rx for insulin pen needles also be sent to [employer] for patient to receive from patient assistance program . Address patient's questions regarding COVID-19 vaccine availability. o Patient expresses interest in specifically receiving [employer] COVID-19 vaccine.  o Discuss locations providing vaccinations and encourage patient to call about scheduling appointment . Patient denies further medication questions/concerns at this time.  Patient Self Care Activities:  . Patient will attend medical appointments as scheduled o Next appointment with PCP office on 3/22 with Dr. Parks Ranger  Please see past updates related to this goal by clicking on the "Past Updates" button in the selected goal         Patient  verbalizes understanding of instructions provided today.   The care management team will reach out to the patient again over the next 30 days.   Harlow Asa, PharmD, [place] [employer] [phone]

## 2019-08-26 NOTE — Chronic Care Management (AMB) (Signed)
Chronic Care Management   Follow Up Note   08/26/2019 Name: [name] MRN: [id number] DOB: 12-05-1945  Referred by: Patricia College, NP (Inactive) Reason for referral : Chronic Care Management (Patient Phone Call)   [name] is a 74 y.o. year old female who is a primary care patient of Patricia College, NP (Inactive). The CCM team was consulted for assistance with chronic disease management and care coordination needs.    Receive a call from patient requesting a call back regarding patient assistance program and COVID-19 vaccine question  I reached out to [name] by phone today.   Review of patient status, including review of consultants reports, relevant laboratory and other test results, and collaboration with appropriate care team members and the patient's provider was performed as part of comprehensive patient evaluation and provision of chronic care management services.      Outpatient Encounter Medications as of 08/26/2019  Medication Sig  . acetaminophen (TYLENOL) 500 MG tablet Take 500 mg by mouth daily as needed.  . ARTIFICIAL TEAR OP Apply to eye.  . BD INSULIN SYRINGE U/F 31G X 5/16" 1 ML MISC USE AS DIRECTED. WITH HUMALOG  . BIOTIN PO Take by mouth daily.  . Cholecalciferol (VITAMIN D3) 25 MCG (1000 UT) CAPS Take 4 capsules by mouth daily.   . clindamycin (CLEOCIN) 300 MG capsule Take 600 mg by mouth. 1 Hour before dental procedures  . COLCRYS 0.6 MG tablet Take 1 tablet (0.6 mg total) by mouth 2 (two) times daily as needed. For up to 3 days for pericarditis, or up to 7 days for gout or connective tissue disorder.  . cyclobenzaprine (FLEXERIL) 5 MG tablet TAKE 1 TABLET (5 MG TOTAL) BY MOUTH ONCE DAILY AS NEEDED FOR MUSCLE SPASMS  . ezetimibe (ZETIA) 10 MG tablet Take 1 tablet (10 mg total) by mouth daily.  . famotidine (PEPCID) 20 MG tablet Take 20 mg by mouth 2 (two) times daily.  Marland Kitchen glucose blood (FREESTYLE LITE) test strip USE  AS DIRECTED THREE TIMES DAILY FOR DIABETES  . Insulin Degludec (TRESIBA FLEXTOUCH) 200 UNIT/ML SOPN Inject 56 Units into the skin daily.  . insulin lispro (HUMALOG) 100 UNIT/ML injection Correction scale: take 1 unit per 50 over 150 before meals up to three times daily.  Up to 20 units per day.  . Insulin Pen Needle (PEN NEEDLES) 32G X 5 MM MISC 1 Device by Does not apply route daily.  Marland Kitchen lactase (LACTAID) 3000 units tablet Take by mouth as needed.   . Lancets (FREESTYLE) lancets Must test x4/day  . nebivolol (BYSTOLIC) 10 MG tablet Take 1 tablet (10 mg total) by mouth daily.  . Nutritional Supplements (NUTRITIONAL SUPPLEMENT PO) Take by mouth. "Blood Boost Formula" by [employer]  . prednisoLONE acetate (PRED FORTE) 1 % ophthalmic suspension INSTILL ONE DROP  as needed  . rosuvastatin (CRESTOR) 10 MG tablet Take 1 tablet (10 mg total) by mouth daily.   No facility-administered encounter medications on file as of 08/26/2019.    Goals Addressed            This Visit's Progress   . [name] - "I'm in the donut hole" (pt-stated)       Current Barriers:  . Financial Barriers: patient has [place] [employer] Preferred Part D insurance and reports copay for Tresiba, Humalog, Bystolic and Colcrys is cost prohibitive at this time  Pharmacist Clinical Goal(s):  Marland Kitchen Over the next 30 days, patient will work with [name]  and providers to relieve medication access concerns  Interventions: . Receive a call from patient requesting a call back regarding patient assistance program and COVID-19 vaccine question . From chart review note patient has called office to request refill of insulin pen needles to use with Tresiba insulin o [facility] . F/u with patient regarding patient assistance program need o Patient reports will pick up pen needles from [facility] for current supply o Will collaborate with office to request Rx for insulin pen needles also be sent to [employer] for  patient to receive from patient assistance program . Address patient's questions regarding COVID-19 vaccine availability. o Patient expresses interest in specifically receiving [employer] COVID-19 vaccine.  o Discuss locations providing vaccinations and encourage patient to call about scheduling appointment . Patient denies further medication questions/concerns at this time.  Patient Self Care Activities:  . Patient will attend medical appointments as scheduled o Next appointment with PCP office on 3/22 with Dr. Parks Watts  Please see past updates related to this goal by clicking on the "Past Updates" button in the selected goal         Plan  The care management team will reach out to the patient again over the next 30 days.   [name], [name], [place] [employer] [phone]

## 2019-08-31 ENCOUNTER — Other Ambulatory Visit: Payer: Self-pay

## 2019-08-31 ENCOUNTER — Ambulatory Visit: Payer: Medicare Other | Admitting: Family Medicine

## 2019-08-31 ENCOUNTER — Encounter: Payer: Self-pay | Admitting: Family Medicine

## 2019-08-31 ENCOUNTER — Ambulatory Visit (INDEPENDENT_AMBULATORY_CARE_PROVIDER_SITE_OTHER): Payer: Medicare Other | Admitting: Family Medicine

## 2019-08-31 VITALS — BP 158/74 | HR 70 | Temp 97.5°F | Resp 16 | Ht 69.0 in | Wt 241.6 lb

## 2019-08-31 DIAGNOSIS — M5442 Lumbago with sciatica, left side: Secondary | ICD-10-CM

## 2019-08-31 DIAGNOSIS — N183 Chronic kidney disease, stage 3 unspecified: Secondary | ICD-10-CM

## 2019-08-31 DIAGNOSIS — G8929 Other chronic pain: Secondary | ICD-10-CM

## 2019-08-31 DIAGNOSIS — E1140 Type 2 diabetes mellitus with diabetic neuropathy, unspecified: Secondary | ICD-10-CM | POA: Diagnosis not present

## 2019-08-31 DIAGNOSIS — M8949 Other hypertrophic osteoarthropathy, multiple sites: Secondary | ICD-10-CM

## 2019-08-31 DIAGNOSIS — M159 Polyosteoarthritis, unspecified: Secondary | ICD-10-CM

## 2019-08-31 DIAGNOSIS — R8271 Bacteriuria: Secondary | ICD-10-CM

## 2019-08-31 DIAGNOSIS — I129 Hypertensive chronic kidney disease with stage 1 through stage 4 chronic kidney disease, or unspecified chronic kidney disease: Secondary | ICD-10-CM | POA: Diagnosis not present

## 2019-08-31 LAB — POCT GLYCOSYLATED HEMOGLOBIN (HGB A1C): Hemoglobin A1C: 8.4 % — AB (ref 4.0–5.6)

## 2019-08-31 LAB — POCT URINALYSIS DIPSTICK
Bilirubin, UA: NEGATIVE
Blood, UA: NEGATIVE
Glucose, UA: NEGATIVE
Ketones, UA: NEGATIVE
Leukocytes, UA: NEGATIVE
Nitrite, UA: NEGATIVE
Protein, UA: NEGATIVE
Spec Grav, UA: 1.01 (ref 1.010–1.025)
Urobilinogen, UA: 0.2 E.U./dL
pH, UA: 5 (ref 5.0–8.0)

## 2019-08-31 NOTE — Progress Notes (Signed)
Subjective:    Patient ID: [name], female    DOB: 27-Feb-1946, 74 y.o.   MRN: [id number]  [name] is a 74 y.o. female presenting on 08/31/2019 for Hyperlipidemia (3 month follow up) and Back Pain (chronic)   HPI   CHRONIC DM, Type 2 neuropathy Admits some swelling associated with Humalog in past She is no longer doing 10u humalog with each meal but can resume this. Still using Tresiba CBGs: avg 150+, recent 130 but has had up to 200+ post prandial 3-4 hours Meds: Tresiba 56u daily, Humalog SSI TID wc (not always using) Reports good compliance. Tolerating well w/o side-effects Currently not on ACEi-ARB - due for urine microalbumin Lifestyle: - Diet (improving diet)  She has chronic neuropathy in feet. Admits lymphedema in both lower extremity Last DM Eye Exam -  Denies hypoglycemia, polyuria, visual changes, numbness or tingling.  CHRONIC HTN: Followed by Dr Rockey Situ [facility]. Identified Sulfa allergy to Thiazide, had side effects on lasix and HCTZ. Now on Bystolic doing well but she thinks it is not completely controlling BP. Cardiology did not have other suggestions at this time. Current Meds - Bystolic 22WL Reports good compliance, took meds today. Tolerating well, w/o complaints. Denies CP, dyspnea, HA, edema, dizziness / lightheadedness  Arteritis / Unspecified Reviewed past visit with - Undetermined Connective Tissue Disorder / History of Chronic recurrent pericarditis / gout flares Previously followed by Cardiology at [facility], she had issues and complications in past with NSAIDs causing GI bleeding. history of unidentified connective tissue disorder - had prior work-up years ago, but inconclusive. She describes concerns with possible vasculitis or lupus in past. She takes colchicine 0.6 BID PRN for this, and needed clarification on order instructions today. History of Sjogren's also reported Elevated ESR and CRP 02/2019  Last visit with  me she was referred to [facility] Seen on 08/19/19 for initial consultation [facility] Dr Flonnie Overman, uncertain history of vasculitis, and they are pursuing other work up. Due for repeat blood and Now awaiting on upcoming CT Angiogram Chest.    Depression screen [facility] 2/9 08/31/2019 05/18/2019 02/12/2019  Decreased Interest 0 0 0  Down, Depressed, Hopeless 0 0 0  PHQ - 2 Score 0 0 0    Social History   Tobacco Use  . Smoking status: Former Smoker    Quit date: 01/22/1996    Years since quitting: 23.6  . Smokeless tobacco: Never Used  Substance Use Topics  . Alcohol use: Yes    Comment: occasional  . Drug use: Never    Review of Systems Per HPI unless specifically indicated above     Objective:    BP (!) 158/74   Pulse 70   Temp (!) 97.5 F (36.4 C) (Temporal)   Resp 16   Ht '5\' 9"'  (1.753 m)   Wt 241 lb 9.6 oz (109.6 kg)   BMI 35.68 kg/m   Wt Readings from Last 3 Encounters:  08/31/19 241 lb 9.6 oz (109.6 kg)  08/25/19 242 lb (109.8 kg)  07/13/19 242 lb 12 oz (110.1 kg)    Physical Exam Vitals and nursing note reviewed.  Constitutional:      General: She is not in acute distress.    Appearance: She is well-developed. She is not diaphoretic.     Comments: Well-appearing, comfortable, cooperative  HENT:     Head: Normocephalic and atraumatic.  Eyes:     General:        Right eye: No  discharge.        Left eye: No discharge.     Conjunctiva/sclera: Conjunctivae normal.  Cardiovascular:     Rate and Rhythm: Normal rate.  Pulmonary:     Effort: Pulmonary effort is normal.  Skin:    General: Skin is warm and dry.     Findings: No erythema or rash.  Neurological:     Mental Status: She is alert and oriented to person, place, and time.  Psychiatric:        Behavior: Behavior normal.     Comments: Well groomed, good eye contact, normal speech and thoughts      Recent Labs    02/10/19 1053 05/18/19 1529 08/31/19 1415  HGBA1C 7.6* 8.2* 8.4*      Results for orders placed or performed in visit on 08/31/19  POCT glycosylated hemoglobin (Hb A1C)  Result Value Ref Range   Hemoglobin A1C 8.4 (A) 4.0 - 5.6 %      Assessment & Plan:   Problem List Items Addressed This Visit    Type 2 diabetes, controlled, with neuropathy ([place]) - Primary   Relevant Orders   POCT glycosylated hemoglobin (Hb A1C) (Completed)   Osteoarthritis   Relevant Orders   Ambulatory referral to Physical Therapy   Benign hypertension with CKD (chronic kidney disease) stage III    Other Visit Diagnoses    Chronic bilateral low back pain with left-sided sciatica       Relevant Orders   Ambulatory referral to Physical Therapy   Asymptomatic bacteriuria       Relevant Orders   Urine Culture   POCT urinalysis dipstick       #HTN CKD-III Chronic elevated / labile BP Managed by Cardiology, off thiazide now due to sulfa allergy. Has many med allergies Improved on Bystolic but not controlled Can be related to pain and stress and other symptoms of acute flare with inflammatory conditions Discussed course, but agree no change today - Last lab mild elevated Creatinine 0.99 but actually this is improved from previous readings to 1.03 in past, this is about average for her kidney function - will defer repeat lab until next due in 3 months. Anticipate blood draw every 6 months.  #Type 2 Diabetes, w/ neuropathy Chronic problem, previously better controlled in A1c 7 range, now today A1c up to 8.4, previously 8.2 - She had stopped Humalog for period of time. - She is on med management program working with [employer] - Previous Urine microalbumin today 0, negative - Already had DM Eye exam - [facility] - requested a copy of this record today RESTART Humalog as she was previously doing Continue [place] Gave her handout with names of GLP1 agents as next option she can consider, also possible oral Rybelsus Follow-up within 3 months, consider other agents  in future if indicated  #Arteritis unspecified / Chronic Inflammatory Conditions / Connective Tissue Disorder / Recurrent Pericarditis / Gout Complex history, reviewed some of background on these conditions on chart and by her report, some of this is not available from out of state physician's at [place] w/ [facility] for now, further testing CTA and labs - May continue Colchicine PRN  #Asymptomatic bacteruria Prior urine showed bacteria but asymptomatic Requesting recheck on urine today, including culture. Hold antibiotic therapy unless indicated on culture and if she has new symptoms.  No orders of the defined types were placed in this encounter.    Follow up plan: Return in about 3  months (around 12/01/2019) for 3 month follow-up DM A1c, HTN, med adjustment w/ Elmyra Ricks.  Patient will need future labs ordered either before or after next visit - CMET, Lipid panel, A1c and other routine labs as indicated CBC.   Nobie Putnam, [facility] 08/31/2019, 1:54 PM

## 2019-08-31 NOTE — Patient Instructions (Addendum)
Thank you for coming to the office today.  Call Dr Flonnie Overman to check status of future CT imaging  Diagnoses  Arteritis (CMS-HCC)    Procedures  CT chest angiogram with and without contrast  Flonnie Overman Leata Mouse, MD  [place], [place] [address]  Phone: [phone]     ----  Recent Labs    02/10/19 1053 05/18/19 1529 08/31/19 1415  HGBA1C 7.6* 8.2* 8.4*   ---------------------------------------------------------------  Increase to Humalog insulin as discussed 10 units with each meal.  Call insurance find cost and coverage of the following  1. Ozempic (Semaglutide injection) 2. Bydureon BCise (Exenatide ER) 3. Trulicity (Dulaglutide)  Or we can consider pills such as Rybelsus, same as ozempic but in pill form.   DUE for FASTING BLOOD WORK (no food or drink after midnight before the lab appointment, only water or coffee without cream/sugar on the morning of)  SCHEDULE "Lab Only" visit in the morning at the clinic for lab draw in 3 MONTHS   - Make sure Lab Only appointment is at about 1 week before your next appointment, so that results will be available  For Lab Results, once available within 2-3 days of blood draw, you can can log in to MyChart online to view your results and a brief explanation. Also, we can discuss results at next follow-up visit.   Please schedule a Follow-up Appointment to: Return in about 3 months (around 12/01/2019) for 3 month follow-up DM A1c, HTN, med adjustment w/ Elmyra Ricks.  If you have any other questions or concerns, please feel free to call the office or send a message through [place]. You may also schedule an earlier appointment if necessary.  Additionally, you may be receiving a survey about your experience at our office within a few days to 1 week by e-mail or mail. We value your feedback.  Nobie Putnam, DO [place]

## 2019-09-01 ENCOUNTER — Encounter: Payer: Self-pay | Admitting: Family Medicine

## 2019-09-01 NOTE — Progress Notes (Signed)
Patient advised.

## 2019-09-02 LAB — URINE CULTURE
MICRO NUMBER:: 10277039
SPECIMEN QUALITY:: ADEQUATE

## 2019-09-04 ENCOUNTER — Other Ambulatory Visit: Payer: Self-pay | Admitting: Family Medicine

## 2019-09-04 DIAGNOSIS — R1011 Right upper quadrant pain: Secondary | ICD-10-CM

## 2019-09-10 DIAGNOSIS — R918 Other nonspecific abnormal finding of lung field: Secondary | ICD-10-CM | POA: Diagnosis not present

## 2019-09-10 DIAGNOSIS — E041 Nontoxic single thyroid nodule: Secondary | ICD-10-CM | POA: Diagnosis not present

## 2019-09-10 DIAGNOSIS — I776 Arteritis, unspecified: Secondary | ICD-10-CM | POA: Diagnosis not present

## 2019-09-14 ENCOUNTER — Telehealth: Payer: Self-pay | Admitting: Cardiovascular Disease

## 2019-09-14 NOTE — Telephone Encounter (Signed)
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

## 2019-09-14 NOTE — Telephone Encounter (Signed)
Left voicemail message that I do not see it in our system but I would pass this information to Dr. Rockey Situ to see if he can review and would call back if he is able to review or not.

## 2019-09-16 NOTE — Telephone Encounter (Signed)
Patient calling in to check on the status of a call back. Patient also wanting a [facility] surgeon recommendation from Dr. Rockey Situ  Please advise

## 2019-09-16 NOTE — Telephone Encounter (Signed)
Spoke with patient and advised that physician has not reviewed at this time. Inquired where she had the CT done and said that it was at [facility]. She also wanted your recommendations for a vascular provider at [facility] from you. Let her know that once you have been able to review I would call back with your recommendations.

## 2019-09-17 ENCOUNTER — Telehealth: Payer: Self-pay | Admitting: Family Medicine

## 2019-09-17 NOTE — Telephone Encounter (Signed)
Pt called said that she had a CT Chest Scan on April 1st, she said that they found a 2 cm mass in her thyroid. Was told that her PCP need to order a radio neuro thyroid scan. Pt  Call back   [phone]

## 2019-09-17 NOTE — Telephone Encounter (Signed)
Can you have patient schedule an appointment before her 12/01/2019 visit to discuss?

## 2019-09-17 NOTE — Telephone Encounter (Signed)
Attempted to contact the patient twice to f/u  With her recent phone message. No answer, no vm.

## 2019-09-17 NOTE — Telephone Encounter (Signed)
I can see the report but not the images I suspect vascular surgery would not want to do surgery on the finding, just watch it with periodic CT scan Would see Dr. Sammuel Hines at [facility], aorta specalist

## 2019-09-17 NOTE — Telephone Encounter (Signed)
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 The Ct scan was ordered by Dr. Flonnie Overman, [facility].   The patient said that she spoke with one of Dr. Flonnie Overman nurses about  ordering the recommended thyroid scan and they informed her to contact her PCP to order the scan. I attempted to contact Dr. Flonnie Overman office to f/u on the request, no answer. [phone] Option #4.

## 2019-09-22 ENCOUNTER — Other Ambulatory Visit: Payer: Self-pay

## 2019-09-22 ENCOUNTER — Ambulatory Visit: Payer: Medicare Other | Attending: Family Medicine | Admitting: Physical Therapy

## 2019-09-22 ENCOUNTER — Encounter: Payer: Self-pay | Admitting: Physical Therapy

## 2019-09-22 DIAGNOSIS — M6281 Muscle weakness (generalized): Secondary | ICD-10-CM | POA: Insufficient documentation

## 2019-09-22 DIAGNOSIS — M545 Low back pain, unspecified: Secondary | ICD-10-CM

## 2019-09-22 DIAGNOSIS — G8929 Other chronic pain: Secondary | ICD-10-CM | POA: Insufficient documentation

## 2019-09-24 NOTE — Telephone Encounter (Signed)
Spoke with patient and reviewed provider recommendations. She verbalized understanding with no further questions at this time.  

## 2019-09-24 NOTE — Telephone Encounter (Signed)
Left voicemail message for patient to call back regarding Dr. Donivan Scull review of her CT results.

## 2019-09-24 NOTE — Telephone Encounter (Signed)
Patient returning call.

## 2019-09-25 ENCOUNTER — Telehealth: Payer: Self-pay

## 2019-09-25 ENCOUNTER — Ambulatory Visit (INDEPENDENT_AMBULATORY_CARE_PROVIDER_SITE_OTHER): Payer: Medicare Other | Admitting: Pharmacist

## 2019-09-25 DIAGNOSIS — E1140 Type 2 diabetes mellitus with diabetic neuropathy, unspecified: Secondary | ICD-10-CM

## 2019-09-25 NOTE — Telephone Encounter (Signed)
I spoke with the patient and appt was scheduled for Tuesday, April 20th for the pt to come in and discuss her recent CT Chest Angiogram.

## 2019-09-25 NOTE — Chronic Care Management (AMB) (Signed)
Chronic Care Management   Follow Up Note   09/25/2019 Name: Haaniya Brewton MRN: [id number] DOB: Jan 17, 1946  Referred by: Verl Bangs, FNP Reason for referral : Chronic Care Management (Patient Phone Call)   Earldine Kainer is a 74 y.o. year old female who is a primary care patient of [name], [name], [facility]. The CCM team was consulted for assistance with chronic disease management and care coordination needs.    Receive voicemail from Ms. Delia Chimes.  I reached out to [name] by phone today.   Review of patient status, including review of consultants reports, relevant laboratory and other test results, and collaboration with appropriate care team members and the patient's provider was performed as part of comprehensive patient evaluation and provision of chronic care management services.     Outpatient Encounter Medications as of 09/25/2019  Medication Sig  . Insulin Degludec (TRESIBA FLEXTOUCH) 200 UNIT/ML SOPN Inject 56 Units into the skin daily.  . insulin lispro (HUMALOG) 100 UNIT/ML injection Correction scale: take 1 unit per 50 over 150 before meals up to three times daily.  Up to 20 units per day.  Marland Kitchen acetaminophen (TYLENOL) 500 MG tablet Take 500 mg by mouth daily as needed.  . ARTIFICIAL TEAR OP Apply to eye.  . BD INSULIN SYRINGE U/F 31G X 5/16" 1 ML MISC USE AS DIRECTED. WITH HUMALOG  . BIOTIN PO Take by mouth daily.  . Cholecalciferol (VITAMIN D3) 25 MCG (1000 UT) CAPS Take 4 capsules by mouth daily.   . clindamycin (CLEOCIN) 300 MG capsule Take 600 mg by mouth. 1 Hour before dental procedures  . COLCRYS 0.6 MG tablet Take 1 tablet (0.6 mg total) by mouth 2 (two) times daily as needed. For up to 3 days for pericarditis, or up to 7 days for gout or connective tissue disorder.  . cyclobenzaprine (FLEXERIL) 5 MG tablet TAKE 1 TABLET (5 MG TOTAL) BY MOUTH ONCE DAILY AS NEEDED FOR MUSCLE SPASMS  . ezetimibe (ZETIA) 10 MG tablet Take 1 tablet (10 mg total) by  mouth daily.  . famotidine (PEPCID) 20 MG tablet Take 20 mg by mouth 2 (two) times daily.  Marland Kitchen glucose blood (FREESTYLE LITE) test strip USE AS DIRECTED THREE TIMES DAILY FOR DIABETES  . Insulin Pen Needle (PEN NEEDLES) 32G X 5 MM MISC 1 Device by Does not apply route daily.  Marland Kitchen lactase (LACTAID) 3000 units tablet Take by mouth as needed.   . Lancets (FREESTYLE) lancets Must test x4/day  . nebivolol (BYSTOLIC) 10 MG tablet Take 1 tablet (10 mg total) by mouth daily.  . Nutritional Supplements (NUTRITIONAL SUPPLEMENT PO) Take by mouth. "Blood Boost Formula" by [employer]  . prednisoLONE acetate (PRED FORTE) 1 % ophthalmic suspension INSTILL ONE DROP  as needed  . rosuvastatin (CRESTOR) 10 MG tablet Take 1 tablet (10 mg total) by mouth daily.   No facility-administered encounter medications on file as of 09/25/2019.    Goals Addressed            This Visit's Progress   . PharmD - Medication Assistance (pt-stated)       Current Barriers:  . Financial Barriers: patient has [place] [employer] Preferred Part D insurance and reports copay for Tresiba, Humalog, Bystolic and Colcrys is cost prohibitive at this time  Pharmacist Clinical Goal(s):  Marland Kitchen Over the next 30 days, patient will work with PharmD and providers to relieve medication access concerns  Interventions: . Perform chart review - Note patient received chest  CTA on 4/1 at [facility] . Patient reports chest CTA at [facility] that showed evidence of a small saccular aneurysm and a nodule on her thyroid o States that she is awaiting call back for scheduling of appointment with Vascular Surgery o Patient has contacted PCP office requesting thyroid ultrasound . Encourage patient to follow up with PCP for new medical concern o Reports has been having itching around vaginal area on and off o Patient states that she will call to schedule . Discuss patient's diabetes monitoring and management o Reports taking - Tresiba - Injecting 56 units  daily - Humalog - Reports following TID sliding scale before meals, which she reports as 10 units with meals plus correction of 1 unit per 50 over 150 o Reports recent morning fasting CBGs 105-134 o Denies any recent low blood sugars . Note from review of chart, Dr. Parks Ranger previously discussed GLP-1 agonists as a potential future agent for patient's diabetes management o Ms. Masley reports that she may have had trouble with her bile duct in the past. Patient states she will check with previous GI provider team regarding this history o Note GLP-1 agonists may increase risk of gallbladder and bile duct disease  Patient Self Care Activities:  . Patient to contact providers for new medical questions/concerns  Please see past updates related to this goal by clicking on the "Past Updates" button in the selected goal         Plan  Telephone follow up appointment with care management team member scheduled for: 5/19 at 1 pm  Harlow Asa, PharmD, [place] [phone]

## 2019-09-25 NOTE — Patient Instructions (Signed)
Thank you allowing the Chronic Care Management Team to be a part of your care! It was a pleasure speaking with you today!     CCM (Chronic Care Management) Team    Noreene Larsson RN, MSN, CCM Nurse Care Coordinator  [phone]   Harlow Asa PharmD  Clinical Pharmacist  [phone]   Eula Fried LCSW Clinical Social Worker [phone]  Visit Information  Goals Addressed            This Visit's Progress   . PharmD - Medication Assistance (pt-stated)       Current Barriers:  . Financial Barriers: patient has [place] [employer] Preferred Part D insurance and reports copay for Tresiba, Humalog, Bystolic and Colcrys is cost prohibitive at this time  Pharmacist Clinical Goal(s):  Marland Kitchen Over the next 30 days, patient will work with PharmD and providers to relieve medication access concerns  Interventions: . Perform chart review - Note patient received chest [facility] on 4/1 at [facility] . Patient reports chest CTA at [facility] that showed evidence of a small saccular aneurysm and a nodule on her thyroid o States that she is awaiting call back for scheduling of appointment with Vascular Surgery o Patient has contacted PCP office requesting thyroid ultrasound . Encourage patient to follow up with PCP for new medical concern o Reports has been having itching around vaginal area on and off o Patient states that she will call to schedule . Discuss patient's diabetes monitoring and management o Reports taking - Tresiba - Injecting 56 units daily - Humalog - Reports following TID sliding scale before meals, which she reports as 10 units with meals plus correction of 1 unit per 50 over 150 o Reports recent morning fasting CBGs 105-134 o Denies any recent low blood sugars . Note from review of chart, Dr. Parks Ranger previously discussed GLP-1 agonists as a potential future agent for patient's diabetes management o Ms. Favero reports that she may have had trouble with her bile duct in the past.  Patient states she will check with previous GI provider team regarding this history o Note GLP-1 agonists may increase risk of gallbladder and bile duct disease  Patient Self Care Activities:  . Patient to contact providers for new medical questions/concerns  Please see past updates related to this goal by clicking on the "Past Updates" button in the selected goal         Patient verbalizes understanding of instructions provided today.   Telephone follow up appointment with care management team member scheduled for: 5/19 at 1 pm  Harlow Asa, PharmD, [place] [phone]

## 2019-09-25 NOTE — Telephone Encounter (Signed)
Copied from [place] [phone]. Topic: General - Other >> Sep 25, 2019 11:06 AM Antonieta Iba C wrote: Reason for CRM: pt called in to follow up on an order for a thyroid check. Pt is requesting a call back from the providers assistant.

## 2019-09-28 NOTE — Telephone Encounter (Signed)
Appointment made by CMA.

## 2019-09-29 ENCOUNTER — Other Ambulatory Visit: Payer: Self-pay

## 2019-09-29 ENCOUNTER — Encounter: Payer: Self-pay | Admitting: Family Medicine

## 2019-09-29 ENCOUNTER — Ambulatory Visit (INDEPENDENT_AMBULATORY_CARE_PROVIDER_SITE_OTHER): Payer: Medicare Other | Admitting: Family Medicine

## 2019-09-29 VITALS — BP 142/61 | HR 74 | Temp 97.3°F | Ht 69.0 in | Wt 240.8 lb

## 2019-09-29 DIAGNOSIS — E041 Nontoxic single thyroid nodule: Secondary | ICD-10-CM | POA: Diagnosis not present

## 2019-09-29 DIAGNOSIS — L659 Nonscarring hair loss, unspecified: Secondary | ICD-10-CM

## 2019-09-29 NOTE — Progress Notes (Signed)
Subjective:    Patient ID: [name], female    DOB: Jul 10, 1945, 74 y.o.   MRN: [id number]  [name] is a 74 y.o. female presenting on 09/29/2019 for Diagnostic Scan   HPI  [name] presents to clinic for follow up on diagnostic scan that was completed with [facility].  Scan was completed on 09/10/2019 with [facility], CT of chest, with incidental finding of right thyroid nodule measuring 2x1.7cm.  Recommendation from [facility] Radiology department is to complete a dedicated thyroid ultrasound.  Last labs for thyroid function were completed 05/26/2018 with a TSH of 1.17.  Has concerns for some new onset hair loss and if that is related to her thyroid.     Depression screen [facility] 2/9 08/31/2019 05/18/2019 02/12/2019  Decreased Interest 0 0 0  Down, Depressed, Hopeless 0 0 0  PHQ - 2 Score 0 0 0    Social History   Tobacco Use  . Smoking status: Former Smoker    Quit date: 01/22/1996    Years since quitting: 23.7  . Smokeless tobacco: Never Used  Substance Use Topics  . Alcohol use: Yes    Comment: occasional  . Drug use: Never    Review of Systems  Constitutional: Negative.   HENT: Negative.   Respiratory: Negative.   Cardiovascular: Negative.   Gastrointestinal: Negative.   Endocrine: Negative.   Neurological: Negative.   Hematological: Negative.   Psychiatric/Behavioral: Negative.    Per HPI unless specifically indicated above     Objective:    BP (!) 142/61 (BP Location: Left Arm, Patient Position: Sitting, Cuff Size: Normal)   Pulse 74   Temp (!) 97.3 F (36.3 C) (Temporal)   Ht 5\' 9"  (1.753 m)   Wt 240 lb 12.8 oz (109.2 kg)   BMI 35.56 kg/m   Wt Readings from Last 3 Encounters:  09/29/19 240 lb 12.8 oz (109.2 kg)  08/31/19 241 lb 9.6 oz (109.6 kg)  08/25/19 242 lb (109.8 kg)    Physical Exam Vitals reviewed.  Constitutional:      General: She is not in acute distress.    Appearance: Normal appearance. She is well-developed and  well-groomed. She is obese. She is not ill-appearing or toxic-appearing.  HENT:     Head: Normocephalic.  Eyes:     General: Lids are normal. Vision grossly intact.        Right eye: No discharge.        Left eye: No discharge.     Extraocular Movements: Extraocular movements intact.     Conjunctiva/sclera: Conjunctivae normal.     Pupils: Pupils are equal, round, and reactive to light.  Neck:     Thyroid: No thyroid mass, thyromegaly or thyroid tenderness.  Cardiovascular:     Rate and Rhythm: Normal rate and regular rhythm.     Pulses: Normal pulses.          Dorsalis pedis pulses are 2+ on the right side and 2+ on the left side.       Posterior tibial pulses are 2+ on the right side and 2+ on the left side.     Heart sounds: Normal heart sounds. No murmur. No friction rub. No gallop.   Pulmonary:     Effort: Pulmonary effort is normal. No respiratory distress.     Breath sounds: Normal breath sounds.  Musculoskeletal:     Cervical back: Normal range of motion and neck supple. No tenderness.     Right lower leg:  No edema.     Left lower leg: No edema.  Feet:     Right foot:     Skin integrity: Skin integrity normal.     Left foot:     Skin integrity: Skin integrity normal.  Lymphadenopathy:     Cervical: No cervical adenopathy.  Skin:    General: Skin is warm and dry.     Capillary Refill: Capillary refill takes less than 2 seconds.  Neurological:     General: No focal deficit present.     Mental Status: She is alert and oriented to person, place, and time.     Cranial Nerves: No cranial nerve deficit.     Sensory: No sensory deficit.     Motor: No weakness.     Coordination: Coordination normal.     Gait: Gait normal.  Psychiatric:        Attention and Perception: Attention and perception normal.        Mood and Affect: Mood and affect normal.        Speech: Speech normal.        Behavior: Behavior normal. Behavior is cooperative.        Thought Content: Thought  content normal.        Cognition and Memory: Cognition and memory normal.        Judgment: Judgment normal.     Results for orders placed or performed in visit on 09/01/19  HM DIABETES EYE EXAM  Result Value Ref Range   HM Diabetic Eye Exam No Retinopathy No Retinopathy      Assessment & Plan:   Problem List Items Addressed This Visit      Endocrine   Thyroid nodule - Primary    Incidental thyroid nodule found on 09/10/2019 chest CT that was completed with [facility].  Radiology recommends dedicated thyroid ultrasound.  Plan: 1. Labs for thyroid function ordered 2. Ultrasound for thyroid ordered, based on results, will refer to endocrinology for treatment.      Relevant Orders   Thyroid Panel With TSH   US THYROID    Other Visit Diagnoses    Hair loss       Relevant Orders   Heavy Metals Panel, Blood      No orders of the defined types were placed in this encounter.     Follow up plan: Return in about 8 weeks (around 11/25/2019) for T2DM F/U.   Harlin Rain, [place] Family Nurse Practitioner [facility] 09/29/2019, 4:45 PM

## 2019-09-29 NOTE — Assessment & Plan Note (Signed)
Incidental thyroid nodule found on 09/10/2019 chest CT that was completed with [facility].  Radiology recommends dedicated thyroid ultrasound.  Plan: 1. Labs for thyroid function ordered 2. Ultrasound for thyroid ordered, based on results, will refer to endocrinology for treatment.

## 2019-09-29 NOTE — Patient Instructions (Signed)
Have your labs completed and we will contact you.  I am putting in your order for thyroid testing.  You should have a call within 1 week.  If you have not heard anything in 7 days, to contact our office and we will follow up.  We will plan to see you back in June as previously scheduled, or sooner, should the need arise.  You will receive a survey after today's visit either digitally by e-mail or paper by [employer]. Your experiences and feedback matter to [place].  Please respond so we know how we are doing as we provide care for you.  Call us with any questions/concerns/needs.  It is my goal to be available to you for your health concerns.  Thanks for choosing me to be a partner in your healthcare needs!  Harlin Rain, FNP-C Family Nurse Practitioner [facility] Phone: [phone]

## 2019-09-30 ENCOUNTER — Telehealth: Payer: Self-pay | Admitting: Family Medicine

## 2019-10-01 ENCOUNTER — Other Ambulatory Visit: Payer: Self-pay

## 2019-10-01 ENCOUNTER — Ambulatory Visit: Payer: Medicare Other | Admitting: Physical Therapy

## 2019-10-01 DIAGNOSIS — M6281 Muscle weakness (generalized): Secondary | ICD-10-CM

## 2019-10-01 DIAGNOSIS — G8929 Other chronic pain: Secondary | ICD-10-CM | POA: Diagnosis not present

## 2019-10-01 DIAGNOSIS — M545 Low back pain: Secondary | ICD-10-CM | POA: Diagnosis not present

## 2019-10-01 NOTE — Therapy (Addendum)
[facility] [address] [place], [place], [address] Phone: [phone]   Fax:  [phone]  Physical Therapy Evaluation  Patient Details  Name: [name] MRN: [id number] Date of Birth: 09/11/1945 Referring Provider (PT): Dr. Parks Ranger   Encounter Date: 09/22/2019   PT End of Session - 10/01/19 0728    Visit Number  1    Number of Visits  5    Date for PT Re-Evaluation  10/20/19    Authorization - Visit Number  1    Authorization - Number of Visits  10    PT Start Time  [id number]    PT Stop Time  1545    PT Time Calculation (min)  58 min    Activity Tolerance  Patient tolerated treatment well    Behavior During Therapy  [facility] for tasks assessed/performed         Past Medical History:  Diagnosis Date  . Anterolisthesis    Cervical spine  . Asthma   . Connective tissue disorder (HCC)    recurrent carotid arteritis, temporal arteritis, vasculitis mandible, general hepatitis, avascular necrosis bilat  . DDD (degenerative disc disease), lumbar   . Diabetes mellitus without complication ([place])   . Diverticulosis   . Duodenitis   . Gouty arthritis   . Hiatal hernia with GERD   . History of cardiovascular stress test    a. 07/2018 [facility]): Fixed inferoapical defect w/ nl contraction-->attenuation artifact. No ischemia. EF 63%.  . Hyperlipidemia   . Hypertension    a. 02/2019 Renal artery duplex: no evidence of prox RAS.  Marland Kitchen Hyperuricemia   . Keratitis sicca, bilateral ([place])   . Lymphedema of both lower extremities   . Osteoarthritis   . Pericarditis    Recurrent: Aug 97, July 05, Sept 08, July 16, July 18  . PUD (peptic ulcer disease)   . 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[place])   . Sjogren's syndrome ([place])   . Syncope   . Tendonitis, Achilles, right   . Tortuous colon   . Trigger finger     Past Surgical History:  Procedure Laterality Date  . ABDOMINAL HYSTERECTOMY    . BREAST BIOPSY    . BREAST EXCISIONAL BIOPSY  Left 1986   neg  . CHOLECYSTECTOMY    . COLONOSCOPY WITH PROPOFOL N/A 08/26/2018   Procedure: COLONOSCOPY WITH PROPOFOL;  Surgeon: Lucilla Lame, MD;  [facility] END[facility];  Service: Endoscopy;  Laterality: N/A;  . CYSTOSCOPY  02/26/2018   [name], MD   . distal arthrectomy    . ESOPHAGOGASTRODUODENOSCOPY (EGD) WITH PROPOFOL N/A 08/26/2018   Procedure: ESOPHAGOGASTRODUODENOSCOPY (EGD) WITH PROPOFOL;  Surgeon: Lucilla Lame, MD;  Location: [facility] ENDOSCOPY;  Service: Endoscopy;  Laterality: N/A;  . EXCISION NEUROMA    . KNEE SURGERY Bilateral    1985, 2001, 11/2002, 12/2006  . panhysterectomy  10/1983  . SHOULDER SURGERY Right    reverse total arthroplasty w/ biceps tenodesis  . TONSILLECTOMY AND ADENOIDECTOMY    . TOTAL HIP ARTHROPLASTY Right 04/2007  . WISDOM TOOTH EXTRACTION      There were no vitals filed for this visit.  Pt. reports her pain is "fine" right now. Pt. reports 5/10 at worst over past week. Pt. benefits from ice to back and heat to L hip. Pt. lives with friend and has 2 cats. Pt. drives and reports she does nothing for fun. Pt. states her friend works and pt. does all the chores. See paperwork pt. brought in  with PMHx (extensive).  Pt. from [place] and came to [place] to take care of friend and then Covid happened. Pt. retired. Pts. ex-husband lives in her house in [place]. R shoulder limitations (5# lifting restrictions). Pt. went to [facility] for 10 years (every week) with benefits reported in low back.     See flowsheet   Objective measurements completed on examination: See above findings.     Supine hamstring/ piriformis/ hip flexor stretches 3x each with static holds Supine lumbar rotn./ instruction in TrA muscle activation.    PT Long Term Goals - 10/25/19 1336      PT LONG TERM GOAL #1   Title  Pt. will be independent with HEP to increase B hip/LE muscle strengthening 1/2 muscle grade to improve pain-free mobility/ upright posture.    Baseline  Generalized B LE  muscle weakness grossly 4/5 MMT (pain limited L hip/ R knee).    Time  4    Period  Weeks    Status  New    Target Date  10/20/19      PT LONG TERM GOAL #2   Title  Pt. will increase FOTO to 58 to improve pain-free mobility.    Baseline  Initial FOTO: 53    Time  4    Period  Weeks    Status  New    Target Date  10/20/19      PT LONG TERM GOAL #3   Title  Pt. will demonstrate/ maintain proper upright posture with standing tolerance with no increase c/o back pain to improve cooking.    Baseline  Moderate rounded shoulders/ forward head posture.    Time  4    Period  Weeks    Status  New    Target Date  10/20/19      PT LONG TERM GOAL #4   Title  Pt. will report 4/10 low back pain at worst with no radicular symptoms to improve ADLs/ household chores.    Baseline  Pt. reports no pain in back currently at rest but >6/10 pain with increase activity.    Time  4    Period  Weeks    Status  New    Target Date  10/20/19          Plan - 10/25/19 1240    Clinical Impression Statement  Pt. is a pleasant 74 y/o female with extensive PMHx/ surgeries and chronic pain.  Pt. is currently not c/o back pain but has R shoulder/knee pain (light touch)/ L LE radicular symptoms.  B lower leg swelling/ lymphadema.  Pt. ambulates with no assistive device.  FOTO: initial 53/ goal 58.  Generalized B LE muscle weakness grossly 4/5 MMT (pain limited L hip/ R knee).  LLD (L>R) in supine position.  Standing forward head/ rounded shoulder posture.  Pt. will benefit from skilled PT services to develop HEP to increase lumbar ROM/ strengthening to improve pain-free mobility.    Stability/Clinical Decision Making  Evolving/Moderate complexity    Clinical Decision Making  Moderate    Rehab Potential  Fair    PT Frequency  1x / week    PT Duration  4 weeks    PT Treatment/Interventions  ADLs/Self Care Home Management;Electrical Stimulation;Moist Heat;Gait training;Stair training;Functional mobility  training;Neuromuscular re-education;Balance training;Therapeutic exercise;Therapeutic activities;Patient/family education;Manual techniques;Passive range of motion    PT Next Visit Plan  Issue/ progress HEP.       Patient will benefit from skilled therapeutic intervention in order to improve the following  deficits and impairments:  Abnormal gait, Pain, Improper body mechanics, Postural dysfunction, Decreased mobility, Decreased activity tolerance, Decreased endurance, Decreased range of motion, Decreased strength, Impaired UE functional use, Impaired flexibility, Difficulty walking  Visit Diagnosis: Chronic bilateral low back pain without sciatica  Muscle weakness (generalized)     Problem List Patient Active Problem List   Diagnosis Date Noted  . Thyroid nodule 09/29/2019  . Lymphedema 11/10/2018  . PAD (peripheral artery disease) ([place]) 10/12/2018  . Aortic atherosclerosis ([place]) 10/12/2018  . Carotid stenosis, asymptomatic, bilateral 10/12/2018  . Positive colorectal cancer screening using Cologuard test 10/09/2018  . Gastroesophageal reflux disease   . Acute gastritis without hemorrhage   . Osteoarthritis 04/29/2018  . Hiatal hernia with GERD 04/29/2018  . Hyperlipidemia 04/29/2018  . Lymphedema of both lower extremities 04/29/2018  . Left knee pain 10/23/2016  . Cataracts, bilateral 07/31/2016  . Glaucoma 07/31/2016  . Lateral epicondylitis of left elbow 10/20/2015  . Vitamin D deficiency 10/20/2015  . Type 2 diabetes, controlled, with neuropathy ([place]) 10/16/2015  . Gouty arthritis 06/03/2015  . H/O syncope 06/03/2015  . Mild intermittent asthma 06/03/2015  . Multiple gastric ulcers 06/03/2015  . Schatzki's ring 06/03/2015  . Undifferentiated connective tissue disease ([place]) 06/03/2015  . Bone disorder 04/05/2015  . Dental injury 04/05/2015  . Pericarditis 04/05/2015  . Sjogren's syndrome ([place]) 09/07/2014  . Elevated alkaline phosphatase level 08/24/2014  . Lactose  intolerance 08/24/2014  . Obesity 08/24/2014  . Peripheral edema 08/24/2014  . 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 Abdominal adhesions 08/02/2014  . 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[place]) 08/02/2014  . Degenerative joint disease (DJD) of lumbar spine 08/02/2014  . Diverticulosis of both small and large intestine 08/02/2014  . Hyperuricemia 08/02/2014  . Pure hypercholesterolemia 08/02/2014  . Trigger finger 08/02/2014  . Right shoulder pain 07/29/2014  . Benign neoplasm of colon 06/30/2010  . Right upper quadrant pain 06/30/2010   Pura Spice, PT, DPT # [id number] 10/25/2019, 1:43 PM  [facility] [address] [place], [place], [address] Phone: [phone]   Fax:  [phone]  Name: [name] MRN: [id number] Date of Birth: December 28, 1945

## 2019-10-02 ENCOUNTER — Other Ambulatory Visit: Payer: Self-pay | Admitting: Family Medicine

## 2019-10-02 DIAGNOSIS — I712 Thoracic aortic aneurysm, without rupture: Secondary | ICD-10-CM | POA: Diagnosis not present

## 2019-10-03 NOTE — Therapy (Addendum)
[facility] [address] [place], [place], [address] Phone: [phone]   Fax:  [phone]  Physical Therapy Treatment  Patient Details  Name: [name] MRN: [id number] Date of Birth: [birth year] Referring Provider (PT): Dr. Parks Ranger   Encounter Date: 10/01/2019   PT End of Session - 10/03/19 1330    Visit Number  2    Number of Visits  5    Date for PT Re-Evaluation  10/20/19    Authorization - Visit Number  2    Authorization - Number of Visits  10    PT Start Time  [id number]    PT Stop Time  1736    PT Time Calculation (min)  53 min    Activity Tolerance  Patient tolerated treatment well;Patient limited by pain    Behavior During Therapy  [facility] for tasks assessed/performed         Past Medical History:  Diagnosis Date  . Anterolisthesis    Cervical spine  . Asthma   . Connective tissue disorder (HCC)    recurrent carotid arteritis, temporal arteritis, vasculitis mandible, general hepatitis, avascular necrosis bilat  . DDD (degenerative disc disease), lumbar   . Diabetes mellitus without complication ([place])   . Diverticulosis   . Duodenitis   . Gouty arthritis   . Hiatal hernia with GERD   . History of cardiovascular stress test    a. 07/2018 [facility]): Fixed inferoapical defect w/ nl contraction-->attenuation artifact. No ischemia. EF 63%.  . Hyperlipidemia   . Hypertension    a. 02/2019 Renal artery duplex: no evidence of prox RAS.  Marland Kitchen Hyperuricemia   . Keratitis sicca, bilateral ([place])   . Lymphedema of both lower extremities   . Osteoarthritis   . Pericarditis    Recurrent: Aug 97, July 05, Sept 08, July 16, July 18  . PUD (peptic ulcer disease)   . 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[place])   . Sjogren's syndrome ([place])   . Syncope   . Tendonitis, Achilles, right   . Tortuous colon   . Trigger finger     Past Surgical History:  Procedure Laterality Date  . ABDOMINAL HYSTERECTOMY    . BREAST BIOPSY    .  BREAST EXCISIONAL BIOPSY Left 1986   neg  . CHOLECYSTECTOMY    . COLONOSCOPY WITH PROPOFOL N/A 08/26/2018   Procedure: COLONOSCOPY WITH PROPOFOL;  Surgeon: Lucilla Lame, MD;  Location: [facility] ENDOSCOPY;  Service: Endoscopy;  Laterality: N/A;  . CYSTOSCOPY  02/26/2018   [name], MD   . distal arthrectomy    . ESOPHAGOGASTRODUODENOSCOPY (EGD) WITH PROPOFOL N/A 08/26/2018   Procedure: ESOPHAGOGASTRODUODENOSCOPY (EGD) WITH PROPOFOL;  Surgeon: Lucilla Lame, MD;  Location: [facility] ENDOSCOPY;  Service: Endoscopy;  Laterality: N/A;  . EXCISION NEUROMA    . KNEE SURGERY Bilateral    1985, 2001, 11/2002, 12/2006  . panhysterectomy  10/1983  . SHOULDER SURGERY Right    reverse total arthroplasty w/ biceps tenodesis  . TONSILLECTOMY AND ADENOIDECTOMY    . TOTAL HIP ARTHROPLASTY Right 04/2007  . WISDOM TOOTH EXTRACTION      There were no vitals filed for this visit.  Subjective Assessment - 11/01/19 1142    Subjective  Pt. states her back is hurting today.  Pt. states "I am a mess" when discussing pain and instructed PT not to touch R shoulder secondary to limitations.    Pertinent History  Pt. from [place] and came to [place] to take care of friend and  then Covid happened.  Pt. retired.  Pts. ex-husband lives in her house in [place].  R shoulder limitations (5# lifting restrictions).  Pt. went to [employer] for 10 years (every week) with benefits reported in low back.    Limitations  Standing;Walking    Patient Stated Goals  Decrease back pain/ improve strength/ mobility.    Currently in Pain?  Yes   no subjective pain score given   Pain Location  Back    Pain Orientation  Lower       There.ex.:  Supine TrA activation (tactile cuing): marching/ pelvic tilts/ hip abduction/ bolster bridging/ SAQ (pain limited in knee)  Manual tx.:  Supine LE/lumbar stretches (20 min.) Seated lumbar paraspinal STM/ use of Hypervolt     PT Long Term Goals - 10/25/19 1336      PT LONG TERM GOAL #1   Title  Pt. will be  independent with HEP to increase B hip/LE muscle strengthening 1/2 muscle grade to improve pain-free mobility/ upright posture.    Baseline  Generalized B LE muscle weakness grossly 4/5 MMT (pain limited L hip/ R knee).    Time  4    Period  Weeks    Status  New    Target Date  10/20/19      PT LONG TERM GOAL #2   Title  Pt. will increase FOTO to 58 to improve pain-free mobility.    Baseline  Initial FOTO: 53    Time  4    Period  Weeks    Status  New    Target Date  10/20/19      PT LONG TERM GOAL #3   Title  Pt. will demonstrate/ maintain proper upright posture with standing tolerance with no increase c/o back pain to improve cooking.    Baseline  Moderate rounded shoulders/ forward head posture.    Time  4    Period  Weeks    Status  New    Target Date  10/20/19      PT LONG TERM GOAL #4   Title  Pt. will report 4/10 low back pain at worst with no radicular symptoms to improve ADLs/ household chores.    Baseline  Pt. reports no pain in back currently at rest but >6/10 pain with increase activity.    Time  4    Period  Weeks    Status  New    Target Date  10/20/19            Plan - 11/01/19 1145    Clinical Impression Statement  Pt. limited by R shoulder/back/ knee pain and moderate lower leg swelling during supine stretches.  No resisted quad/knee ex.due to moderate crepitus/pain with manual resistance during MMT.  Pt. instructed in core ex./ generalized stretching program during tx. session.    Stability/Clinical Decision Making  Evolving/Moderate complexity    Clinical Decision Making  Moderate    Rehab Potential  Fair    PT Frequency  1x / week    PT Duration  4 weeks    PT Treatment/Interventions  ADLs/Self Care Home Management;Electrical Stimulation;Moist Heat;Gait training;Stair training;Functional mobility training;Neuromuscular re-education;Balance training;Therapeutic exercise;Therapeutic activities;Patient/family education;Manual techniques;Passive range of  motion    PT Next Visit Plan  Review HEP       Patient will benefit from skilled therapeutic intervention in order to improve the following deficits and impairments:  Abnormal gait, Pain, Improper body mechanics, Postural dysfunction, Decreased mobility, Decreased activity tolerance, Decreased endurance, Decreased range of  motion, Decreased strength, Impaired UE functional use, Impaired flexibility, Difficulty walking  Visit Diagnosis: Chronic bilateral low back pain without sciatica  Muscle weakness (generalized)     Problem List Patient Active Problem List   Diagnosis Date Noted  . Thyroid nodule 09/29/2019  . Lymphedema 11/10/2018  . PAD (peripheral artery disease) ([place]) 10/12/2018  . Aortic atherosclerosis ([place]) 10/12/2018  . Carotid stenosis, asymptomatic, bilateral 10/12/2018  . Positive colorectal cancer screening using Cologuard test 10/09/2018  . Gastroesophageal reflux disease   . Acute gastritis without hemorrhage   . Osteoarthritis 04/29/2018  . Hiatal hernia with GERD 04/29/2018  . Hyperlipidemia 04/29/2018  . Lymphedema of both lower extremities 04/29/2018  . Left knee pain 10/23/2016  . Cataracts, bilateral 07/31/2016  . Glaucoma 07/31/2016  . Lateral epicondylitis of left elbow 10/20/2015  . Vitamin D deficiency 10/20/2015  . Type 2 diabetes, controlled, with neuropathy ([place]) 10/16/2015  . Gouty arthritis 06/03/2015  . H/O syncope 06/03/2015  . Mild intermittent asthma 06/03/2015  . Multiple gastric ulcers 06/03/2015  . Schatzki's ring 06/03/2015  . Undifferentiated connective tissue disease ([place]) 06/03/2015  . Bone disorder 04/05/2015  . Dental injury 04/05/2015  . Pericarditis 04/05/2015  . Sjogren's syndrome ([place]) 09/07/2014  . Elevated alkaline phosphatase level 08/24/2014  . Lactose intolerance 08/24/2014  . Obesity 08/24/2014  . Peripheral edema 08/24/2014  . 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  Abdominal adhesions 08/02/2014  . 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[place]) 08/02/2014  . Degenerative joint disease (DJD) of lumbar spine 08/02/2014  . Diverticulosis of both small and large intestine 08/02/2014  . Hyperuricemia 08/02/2014  . Pure hypercholesterolemia 08/02/2014  . Trigger finger 08/02/2014  . Right shoulder pain 07/29/2014  . Benign neoplasm of colon 06/30/2010  . Right upper quadrant pain 06/30/2010   Pura Spice, PT, DPT # [id number] 11/01/2019, 11:51 AM  [facility] [address] [place], [place], [address] Phone: [phone]   Fax:  [phone]  Name: Alphonso Bielak MRN: [id number] Date of Birth: 1945/11/17

## 2019-10-06 ENCOUNTER — Other Ambulatory Visit: Payer: Self-pay | Admitting: Family Medicine

## 2019-10-06 NOTE — Telephone Encounter (Signed)
[name] said that she actually doesn't need you to order the [facility]. She actually ordered some directly from the company.   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 She also have the thyroid scan scheduled for May 15th at [facility].

## 2019-10-06 NOTE — Telephone Encounter (Signed)
The patient confirmed that  it is a spray that she use. She said she use it for cleansing and prefer the spray.

## 2019-10-06 NOTE — Telephone Encounter (Signed)
-----   Message from Verl Bangs, FNP sent at 10/02/2019  1:11 PM EDT ----- Regarding: 4/21 CRM [name] called about a Sensi-Care product that she was using.  I thought she had said it was a spray but our ordering system shows it as a cream or ointment.  Does she have a preference of which one she wants and I can send in a prescription if she would like.  Thanks

## 2019-10-06 NOTE — Telephone Encounter (Signed)
I called [facility] that she goes to and they said they do not have it as a prescription, is likely OTC and not covered by insurance (I can only find in our system as an ointment or a cream).  Michela Pitcher they will try and find it on the shelf and if not there, they can look into ordering directly for her.

## 2019-10-09 ENCOUNTER — Telehealth: Payer: Self-pay | Admitting: Cardiovascular Disease

## 2019-10-09 ENCOUNTER — Other Ambulatory Visit: Payer: Self-pay | Admitting: Nurse Practitioner

## 2019-10-09 DIAGNOSIS — E1140 Type 2 diabetes mellitus with diabetic neuropathy, unspecified: Secondary | ICD-10-CM

## 2019-10-09 LAB — HEAVY METALS PANEL, BLOOD
Arsenic: <10 mcg/L (ref <23)
Lead: 1 ug/dL (ref <5)
Mercury, B: <5 mcg/L (ref 0–10)

## 2019-10-09 LAB — THYROID PANEL WITH TSH
Free Thyroxine Index: 2.1 (ref 1.4–3.8)
T3 Uptake: 25 % (ref 22–35)
T4, Total: 8.5 ug/dL (ref 5.1–11.9)
TSH: 0.69 mIU/L (ref 0.40–4.50)

## 2019-10-09 NOTE — Telephone Encounter (Signed)
Patient calling to speak with Dr. 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 Rockey Situ should see her to follow from a medical cardiology stand point to manage as well.   Please call

## 2019-10-09 NOTE — Telephone Encounter (Signed)
Attempted to call patient. 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 Volumetric pre and post contrasted, arterial only, images through the chest were obtained. This study was acquired following the IV administration of iodinated contrast material, given the patient's indications for the examination. If IV contrast material had not been administered, the likelihood of detecting abnormalities relevant to the patients condition would have been substantially decreased.  Complex image reconstruction post processing was likewise performed as indicated to increase the sensitivity of detecting clinically relevant pathology. Evaluation of solid organs may be limited due dedicated imaging technique for vascular processes as per indication for examination.    Findings: CTA Chest  Tricuspid aortic valve. Coronary arteries have normal origins from the sinuses of Valsalva, but are not evaluated for patency given nongated technique. There is mild to moderate coronary artery calcification.  Ascending aorta and descending thoracic aorta are within normal limits caliber. There is mild intermittent calcified and noncalcified plaque in the arch and descending thoracic aorta. At the lateral aspect of the transverse arch (series 10, 142), there is a focal small outpouching of the lateral aortic wall measuring approximately 0.9 x 2.0 cm. The outpouching is peripherally calcified.  There is no evidence for aortic wall thickening. And the great vessels demonstrate mild calcification at the ostia without hemodynamically significant narrowing. There is no wall thickening in the visualized portions  of the great vessels.  Noncontrast CT demonstrates no evidence for intramural hematoma.  CT Chest:  A right shoulder arthroplasty is noted. There is a right thyroid gland nodule measuring approximately 2 x 1.7 cm (10/68). No enlarged axillary lymph nodes.  There is calcified mediastinal lymphadenopathy, as well as calcified hilar lymphadenopathy. Index precarinal nodal conglomerate measures approximately 2.7 cm in short axis dimension on series 10, image 186. There is mild enlargement of the main PA caliber. There is no pericardial effusion. There is mitral annular calcification. The bilateral atria are enlarged. There is a moderately-sized hiatal hernia.  There are several calcified granulomas in the lungs, the largest located in the left lower lobe, in keeping with a prior granulomatous infection. The trachea and bilateral mainstem bronchi are patent.  There are no aggressive bone lesions. A small sclerotic lesion in T12 is most likely a bone island.  Limited upper abdominal view demonstrates cholecystectomy clips and multiple calcified splenic granulomas.  Impression:   1. No evidence for acute aortic pathology. No CT evidence for vasculitis.  2. Mild atherosclerosis in the thoracic aorta. 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   3. Calcified mediastinal and hilar adenopathy. Several calcified nodules in the lungs and the spleen. Findings are in keeping with prior granulomatous infection.  4. Right thyroid nodule measuring up to 2 cm. Consider dedicated thyroid ultrasound.   Electronically Signed by: Greg Cutter, MD, [facility] Electronically Signed on: 09/10/2019 3:32 PM  Other Result Information  Interface, Rad Results In - 09/10/2019  3:33 PM EDT Formatting of this note might be different from the  original. 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 Volumetric pre and post contrasted, arterial only, images through the chest were obtained. This study was acquired following the IV administration of iodinated contrast material, given the patient's indications for the examination.  If IV contrast material had not been administered, the likelihood of detecting abnormalities relevant to the patients condition would have been substantially decreased.  Complex image reconstruction post processing was likewise performed as indicated to increase the sensitivity of detecting clinically relevant pathology.  Evaluation of solid organs may be limited due dedicated imaging technique for vascular processes as per indication for examination.    Findings: CTA Chest  Tricuspid aortic valve. Coronary arteries have normal origins from the sinuses of Valsalva, but are not evaluated for patency given nongated technique. There is mild to moderate coronary artery calcification.  Ascending aorta and descending thoracic aorta are within normal limits caliber. There is mild intermittent calcified and noncalcified plaque in the arch and descending thoracic aorta. At the lateral aspect of the transverse arch (series 10, 142), there is a focal small outpouching of the lateral aortic wall measuring approximately 0.9 x 2.0 cm. The outpouching is peripherally calcified.  There is no evidence for aortic wall thickening. And the great vessels demonstrate mild calcification at the ostia without hemodynamically significant narrowing. There is no wall thickening in the visualized portions of the great vessels.  Noncontrast CT demonstrates no evidence for intramural hematoma.  CT  Chest:  A right shoulder arthroplasty is noted. There is a right thyroid gland nodule measuring approximately 2 x 1.7 cm (10/68). No enlarged axillary lymph nodes.  There is calcified mediastinal lymphadenopathy, as well as calcified hilar lymphadenopathy. Index precarinal nodal conglomerate measures approximately 2.7 cm in short axis dimension on series 10, image 186. There is mild enlargement of the main PA caliber. There is no pericardial effusion. There is mitral annular calcification. The bilateral atria are enlarged. There is a moderately-sized hiatal hernia.  There are several calcified granulomas in the lungs, the largest located in the left lower lobe, in keeping with a prior granulomatous infection. The trachea and bilateral mainstem bronchi are patent.  There are no aggressive bone lesions. A small sclerotic lesion in T12 is most likely a bone island.  Limited upper abdominal view demonstrates cholecystectomy clips and multiple calcified splenic granulomas.  Impression:   1. No evidence for acute aortic pathology. No CT evidence for vasculitis.  2. Mild atherosclerosis in the thoracic aorta. 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    3. Calcified mediastinal and hilar adenopathy. Several calcified nodules in the lungs and the spleen. Findings are in keeping with prior granulomatous infection.  4. Right thyroid nodule measuring up to 2 cm. Consider dedicated thyroid ultrasound.   Electronically Signed by:  Greg Cutter, MD, [facility] Electronically Signed on:  09/10/2019 3:32 PM  "

## 2019-10-09 NOTE — Telephone Encounter (Signed)
Requested Prescriptions  Pending Prescriptions Disp Refills  . FREESTYLE LITE test strip [Pharmacy Med Name: FREESTYLE LITE TEST STRIP] 100 strip 4    Sig: USE AS DIRECTED THREE TIMES DAILY FOR DIABETES     Endocrinology: Diabetes - Testing Supplies Passed - 10/09/2019  1:16 AM      Passed - Valid encounter within last 12 months    Recent Outpatient Visits          1 week ago Thyroid nodule   [facility], [name], FNP   1 month ago Type 2 diabetes, controlled, with neuropathy [facility])   [place], DO   4 months ago Benign hypertension with CKD (chronic kidney disease) stage III   [place], DO   7 months ago Essential hypertension   [place], NP   9 months ago RUQ pain   [facility]ily[facility] Puma, Jerrel Ivory, NP      Future Appointments            In 1 month [place], [name], [facility], [facility]

## 2019-10-11 NOTE — Telephone Encounter (Signed)
From a cardiac perspective looks okay No further testing

## 2019-10-12 NOTE — Telephone Encounter (Signed)
Spoke with patient and reviewed provider interpretation from a cardiac standpoint and she verbalized understanding with no further questions at this time. She reports that provider which ordered the CT did recommend consult with thoracic surgeon and she is waiting on that to be done. She also discussed some thyroid issues as well. She verbalized understanding of our conversation with no further questions at this time.

## 2019-10-15 ENCOUNTER — Ambulatory Visit: Payer: Medicare Other | Attending: Family Medicine | Admitting: Physical Therapy

## 2019-10-15 ENCOUNTER — Other Ambulatory Visit: Payer: Self-pay

## 2019-10-15 DIAGNOSIS — M6281 Muscle weakness (generalized): Secondary | ICD-10-CM | POA: Insufficient documentation

## 2019-10-15 DIAGNOSIS — M545 Low back pain, unspecified: Secondary | ICD-10-CM

## 2019-10-15 DIAGNOSIS — G8929 Other chronic pain: Secondary | ICD-10-CM | POA: Diagnosis not present

## 2019-10-16 NOTE — Therapy (Addendum)
[facility] [address]. [place], [place], [address] Phone: [phone]   Fax:  [phone]  Physical Therapy Treatment  Patient Details  Name: [name] MRN: [id number] Date of Birth: August 01, 1945 Referring Provider (PT): Dr. Parks Ranger   Encounter Date: 10/15/2019   PT End of Session - 10/16/19 0759    Visit Number  3    Number of Visits  5    Date for PT Re-Evaluation  10/20/19    Authorization - Visit Number  3    Authorization - Number of Visits  10    PT Start Time  [id number]    PT Stop Time  1646    PT Time Calculation (min)  50 min    Activity Tolerance  Patient tolerated treatment well;Patient limited by pain    Behavior During Therapy  [facility] for tasks assessed/performed        Past Medical History:  Diagnosis Date  . Anterolisthesis    Cervical spine  . Asthma   . Connective tissue disorder (HCC)    recurrent carotid arteritis, temporal arteritis, vasculitis mandible, general hepatitis, avascular necrosis bilat  . DDD (degenerative disc disease), lumbar   . Diabetes mellitus without complication ([place])   . Diverticulosis   . Duodenitis   . Gouty arthritis   . Hiatal hernia with GERD   . History of cardiovascular stress test    a. 07/2018 [facility]): Fixed inferoapical defect w/ nl contraction-->attenuation artifact. No ischemia. EF 63%.  . Hyperlipidemia   . Hypertension    a. 02/2019 Renal artery duplex: no evidence of prox RAS.  Marland Kitchen Hyperuricemia   . Keratitis sicca, bilateral ([place])   . Lymphedema of both lower extremities   . Osteoarthritis   . Pericarditis    Recurrent: Aug 97, July 05, Sept 08, July 16, July 18  . PUD (peptic ulcer disease)   . 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[place])   . Sjogren's syndrome ([place])   . Syncope   . Tendonitis, Achilles, right   . Tortuous colon   . Trigger finger     Past Surgical History:  Procedure Laterality Date  . ABDOMINAL HYSTERECTOMY    . BREAST BIOPSY    . BREAST  EXCISIONAL BIOPSY Left 1986   neg  . CHOLECYSTECTOMY    . COLONOSCOPY WITH PROPOFOL N/A 08/26/2018   Procedure: COLONOSCOPY WITH PROPOFOL;  Surgeon: Lucilla Lame, MD;  Location: [facility] ENDOSCOPY;  Service: Endoscopy;  Laterality: N/A;  . CYSTOSCOPY  02/26/2018   [name], MD   . distal arthrectomy    . ESOPHAGOGASTRODUODENOSCOPY (EGD) WITH PROPOFOL N/A 08/26/2018   Procedure: ESOPHAGOGASTRODUODENOSCOPY (EGD) WITH PROPOFOL;  Surgeon: Lucilla Lame, MD;  Location: [facility] ENDOSCOPY;  Service: Endoscopy;  Laterality: N/A;  . EXCISION NEUROMA    . KNEE SURGERY Bilateral    1985, 2001, 11/2002, 12/2006  . panhysterectomy  10/1983  . SHOULDER SURGERY Right    reverse total arthroplasty w/ biceps tenodesis  . TONSILLECTOMY AND ADENOIDECTOMY    . TOTAL HIP ARTHROPLASTY Right 04/2007  . WISDOM TOOTH EXTRACTION      There were no vitals filed for this visit.  Subjective Assessment - 11/14/19 1416    Subjective  Pt. states her knee/lower legs are hurting today.  Back hurts but is not primary complaint.    Pertinent History  Pt. from [place] and came to [place] to take care of friend and then Covid happened.  Pt. retired.  Pts. ex-husband lives in her house  in [place].  R shoulder limitations (5# lifting restrictions).  Pt. went to [employer] [facility] [facility] for 10 years (every week) with benefits reported in low back.    Limitations  Standing;Walking    Patient Stated Goals  Decrease back pain/ improve strength/ mobility.    Currently in Pain?  Yes   No subjective pain score        Manual tx.:  Supine LE/lumbar stretches (26 min.) Seated lumbar paraspinal STM/ use of Hypervolt R sidelying L hip and low back STM/ manual tx. (as tolerated)- generalized tenderness    PT Long Term Goals - 10/25/19 1336      PT LONG TERM GOAL #1   Title  Pt. will be independent with HEP to increase B hip/LE muscle strengthening 1/2 muscle grade to improve pain-free mobility/ upright posture.    Baseline  Generalized B LE muscle weakness  grossly 4/5 MMT (pain limited L hip/ R knee).    Time  4    Period  Weeks    Status  New    Target Date  10/20/19      PT LONG TERM GOAL #2   Title  Pt. will increase FOTO to 58 to improve pain-free mobility.    Baseline  Initial FOTO: 53    Time  4    Period  Weeks    Status  New    Target Date  10/20/19      PT LONG TERM GOAL #3   Title  Pt. will demonstrate/ maintain proper upright posture with standing tolerance with no increase c/o back pain to improve cooking.    Baseline  Moderate rounded shoulders/ forward head posture.    Time  4    Period  Weeks    Status  New    Target Date  10/20/19      PT LONG TERM GOAL #4   Title  Pt. will report 4/10 low back pain at worst with no radicular symptoms to improve ADLs/ household chores.    Baseline  Pt. reports no pain in back currently at rest but >6/10 pain with increase activity.    Time  4    Period  Weeks    Status  New    Target Date  10/20/19         Plan - 11/14/19 1419    Clinical Impression Statement  Pt. limited by generalized c/o pain in multiple joints.  Pt. has tenderness in lumbar/hip/ITB region with light palpation.  Pt. reports feeling "looser" after soft tissue massage/ mobs. to R hip/low back.  Pt. ambulates with antalgic gait around PT clinic due to leg swelling/ pain.    Stability/Clinical Decision Making  Evolving/Moderate complexity    Clinical Decision Making  Moderate    Rehab Potential  Fair    PT Frequency  1x / week    PT Duration  4 weeks    PT Treatment/Interventions  ADLs/Self Care Home Management;Electrical Stimulation;Moist Heat;Gait training;Stair training;Functional mobility training;Neuromuscular re-education;Balance training;Therapeutic exercise;Therapeutic activities;Patient/family education;Manual techniques;Passive range of motion    PT Next Visit Plan  Progress standing ther.ex.       Patient will benefit from skilled therapeutic intervention in order to improve the following deficits  and impairments:  Abnormal gait, Pain, Improper body mechanics, Postural dysfunction, Decreased mobility, Decreased activity tolerance, Decreased endurance, Decreased range of motion, Decreased strength, Impaired UE functional use, Impaired flexibility, Difficulty walking  Visit Diagnosis: Chronic bilateral low back pain without sciatica  Muscle weakness (generalized)  Problem List Patient Active Problem List   Diagnosis Date Noted  . Thyroid nodule 09/29/2019  . Lymphedema 11/10/2018  . PAD (peripheral artery disease) ([place]) 10/12/2018  . Aortic atherosclerosis ([place]) 10/12/2018  . Carotid stenosis, asymptomatic, bilateral 10/12/2018  . Positive colorectal cancer screening using Cologuard test 10/09/2018  . Gastroesophageal reflux disease   . Acute gastritis without hemorrhage   . Osteoarthritis 04/29/2018  . Hiatal hernia with GERD 04/29/2018  . Hyperlipidemia 04/29/2018  . Lymphedema of both lower extremities 04/29/2018  . Left knee pain 10/23/2016  . Cataracts, bilateral 07/31/2016  . Glaucoma 07/31/2016  . Lateral epicondylitis of left elbow 10/20/2015  . Vitamin D deficiency 10/20/2015  . Type 2 diabetes, controlled, with neuropathy ([place]) 10/16/2015  . Gouty arthritis 06/03/2015  . H/O syncope 06/03/2015  . Mild intermittent asthma 06/03/2015  . Multiple gastric ulcers 06/03/2015  . Schatzki's ring 06/03/2015  . Undifferentiated connective tissue disease ([place]) 06/03/2015  . Bone disorder 04/05/2015  . Dental injury 04/05/2015  . Pericarditis 04/05/2015  . Sjogren's syndrome ([place]) 09/07/2014  . Elevated alkaline phosphatase level 08/24/2014  . Lactose intolerance 08/24/2014  . Obesity 08/24/2014  . Peripheral edema 08/24/2014  . 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 Abdominal adhesions 08/02/2014  . 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[place]) 08/02/2014  . Degenerative joint disease (DJD) of lumbar spine 08/02/2014  .  Diverticulosis of both small and large intestine 08/02/2014  . Hyperuricemia 08/02/2014  . Pure hypercholesterolemia 08/02/2014  . Trigger finger 08/02/2014  . Right shoulder pain 07/29/2014  . Benign neoplasm of colon 06/30/2010  . Right upper quadrant pain 06/30/2010   Pura Spice, PT, DPT # [phone] 11/14/2019, 3:37 PM   [facility] [address] [place], [place], [address] Phone: [phone]   Fax:  [phone]  Name: [name] MRN: [id number] Date of Birth: [birth year]

## 2019-10-22 ENCOUNTER — Other Ambulatory Visit: Payer: Self-pay

## 2019-10-22 ENCOUNTER — Ambulatory Visit: Payer: Medicare Other | Admitting: Physical Therapy

## 2019-10-22 DIAGNOSIS — M6281 Muscle weakness (generalized): Secondary | ICD-10-CM

## 2019-10-22 DIAGNOSIS — M545 Low back pain, unspecified: Secondary | ICD-10-CM

## 2019-10-22 DIAGNOSIS — G8929 Other chronic pain: Secondary | ICD-10-CM | POA: Diagnosis not present

## 2019-10-23 DIAGNOSIS — E042 Nontoxic multinodular goiter: Secondary | ICD-10-CM | POA: Diagnosis not present

## 2019-10-23 DIAGNOSIS — E041 Nontoxic single thyroid nodule: Secondary | ICD-10-CM | POA: Diagnosis not present

## 2019-10-25 NOTE — Addendum Note (Signed)
Addended by: Pura Spice on: 10/25/2019 01:47 PM   Modules accepted: Orders

## 2019-10-26 DIAGNOSIS — I712 Thoracic aortic aneurysm, without rupture: Secondary | ICD-10-CM | POA: Diagnosis not present

## 2019-10-28 ENCOUNTER — Ambulatory Visit (INDEPENDENT_AMBULATORY_CARE_PROVIDER_SITE_OTHER): Payer: Medicare Other | Admitting: Pharmacist

## 2019-10-28 DIAGNOSIS — N183 Chronic kidney disease, stage 3 unspecified: Secondary | ICD-10-CM | POA: Diagnosis not present

## 2019-10-28 DIAGNOSIS — I129 Hypertensive chronic kidney disease with stage 1 through stage 4 chronic kidney disease, or unspecified chronic kidney disease: Secondary | ICD-10-CM

## 2019-10-28 DIAGNOSIS — E1140 Type 2 diabetes mellitus with diabetic neuropathy, unspecified: Secondary | ICD-10-CM

## 2019-10-28 NOTE — Progress Notes (Signed)
Thanks for the update - the results haven't even hit the chart yet.  I will definitely get a referral in to endocrinology once the report is in the chart so they can send along with the referral.  Thanks so much

## 2019-10-28 NOTE — Chronic Care Management (AMB) (Signed)
Chronic Care Management   Follow Up Note   10/28/2019 Name: [name] MRN: [id number] DOB: 07/31/1945  Referred by: Verl Bangs, FNP Reason for referral : Chronic Care Management (Patient Phone Call)   [name] is a 74 y.o. year old female who is a primary care patient of [name], [name], [place]. The CCM team was consulted for assistance with chronic disease management and care coordination needs.    I reached out to [name] by phone today.   Review of patient status, including review of consultants reports, relevant laboratory and other test results, and collaboration with appropriate care team members and the patient's provider was performed as part of comprehensive patient evaluation and provision of chronic care management services.     Outpatient Encounter Medications as of 10/28/2019  Medication Sig  . nebivolol (BYSTOLIC) 10 MG tablet Take 1 tablet (10 mg total) by mouth daily.  . rosuvastatin (CRESTOR) 10 MG tablet Take 1 tablet (10 mg total) by mouth daily.  Marland Kitchen acetaminophen (TYLENOL) 500 MG tablet Take 500 mg by mouth daily as needed.  . ARTIFICIAL TEAR OP Apply to eye.  . BD INSULIN SYRINGE U/F 31G X 5/16" 1 ML MISC USE AS DIRECTED. WITH HUMALOG  . BIOTIN PO Take by mouth daily.  . Cholecalciferol (VITAMIN D3) 25 MCG (1000 UT) CAPS Take 4 capsules by mouth daily.   . clindamycin (CLEOCIN) 300 MG capsule Take 600 mg by mouth. 1 Hour before dental procedures  . COLCRYS 0.6 MG tablet Take 1 tablet (0.6 mg total) by mouth 2 (two) times daily as needed. For up to 3 days for pericarditis, or up to 7 days for gout or connective tissue disorder.  . cyclobenzaprine (FLEXERIL) 5 MG tablet TAKE 1 TABLET (5 MG TOTAL) BY MOUTH ONCE DAILY AS NEEDED FOR MUSCLE SPASMS  . ezetimibe (ZETIA) 10 MG tablet Take 1 tablet (10 mg total) by mouth daily. (Patient not taking: Reported on 10/28/2019)  . famotidine (PEPCID) 20 MG tablet Take 20 mg by mouth 2 (two) times  daily.  Marland Kitchen FREESTYLE LITE test strip USE AS DIRECTED THREE TIMES DAILY FOR DIABETES  . Insulin Degludec (TRESIBA FLEXTOUCH) 200 UNIT/ML SOPN Inject 56 Units into the skin daily.  . insulin lispro (HUMALOG) 100 UNIT/ML injection Correction scale: take 1 unit per 50 over 150 before meals up to three times daily.  Up to 20 units per day.  . Insulin Pen Needle (PEN NEEDLES) 32G X 5 MM MISC 1 Device by Does not apply route daily.  Marland Kitchen lactase (LACTAID) 3000 units tablet Take by mouth as needed.   . Lancets (FREESTYLE) lancets Must test x4/day  . Nutritional Supplements (NUTRITIONAL SUPPLEMENT PO) Take by mouth. "Blood Boost Formula" by [employer]  . prednisoLONE acetate (PRED FORTE) 1 % ophthalmic suspension INSTILL ONE DROP  as needed   No facility-administered encounter medications on file as of 10/28/2019.    Goals Addressed            This Visit's Progress   . PharmD - Medication Assistance (pt-stated)       Current Barriers:  . Financial Barriers: patient has [place] [employer] Preferred Part D insurance and reports copay for Tresiba, Humalog, Bystolic and Colcrys is cost prohibitive at this time  Pharmacist Clinical Goal(s):  Marland Kitchen Over the next 30 days, patient will work with PharmD and providers to relieve medication access concerns  Interventions: . Perform chart review . Follow up with patient o Reports  seen by Cardiothoracic Surgery on 5/17. - States wants second opinion about aneurysm and planning to see Cardiologist at [facility] o Note completed thyroid ultrasound on 5/14 - Patient asking about who she can see to have the thyroid nodules removed . Discuss patient's diabetes monitoring and management o Reports taking - Tresiba - Injecting 56 units daily - Humalog - Reports following TID sliding scale before meals, which she reports as 10 units with meals plus correction of 1 unit per 50 over 150 o Reports recent morning fasting CBGs ranging: 105-125 o Denies any recent low blood  sugars - Discuss s/s of low and management of low. Patient reports keeps glucose tablets with her. . Discuss importance of blood pressure control and monitoring o Reports taking Bystolic 10 mg once daily o Encourage to follow direction as provided by Cardiologist . Discuss importance of cholesterol control o Reports recently stopped taking ezetimibe because had GI discomfort/diarrhea that she attributed to the medication, but states planning to retry and see if GI symptom returns. o Reports taking rosuvastatin 10 mg once daily  . Discuss COVID-19 vaccination and continued importance of COVID-19 prevention o Reports planning to receive COVID-19 vaccination after mammogram appointment in July . Will follow up with PCP regarding patient's request for referral for further follow up about thyroid nodules  Patient Self Care Activities:  . Patient to contact providers for new medical questions/concerns . Patient to attend scheduled medical appointments o Next appointment with PCP on 6/22  Please see past updates related to this goal by clicking on the "Past Updates" button in the selected goal         Plan  The care management team will reach out to the patient again over the next 60 days.   Harlow Asa, PharmD, [place] [employer] [phone]

## 2019-10-28 NOTE — Patient Instructions (Signed)
Thank you allowing the Chronic Care Management Team to be a part of your care! It was a pleasure speaking with you today!     CCM (Chronic Care Management) Team    Noreene Larsson RN, MSN, CCM Nurse Care Coordinator  [phone]   Harlow Asa PharmD  Clinical Pharmacist  [phone]   Eula Fried LCSW Clinical Social Worker [phone]  Visit Information  Goals Addressed            This Visit's Progress   . PharmD - Medication Assistance (pt-stated)       Current Barriers:  . Financial Barriers: patient has [place] [employer] Preferred Part D insurance and reports copay for Tresiba, Humalog, Bystolic and Colcrys is cost prohibitive at this time  Pharmacist Clinical Goal(s):  Marland Kitchen Over the next 30 days, patient will work with PharmD and providers to relieve medication access concerns  Interventions: . Perform chart review . Follow up with patient o Reports seen by Cardiothoracic Surgery on 5/17. - States wants second opinion about aneurysm and planning to see Cardiologist at [facility] o Note completed thyroid ultrasound on 5/14 - Patient asking about who she can see to have the thyroid nodules removed . Discuss patient's diabetes monitoring and management o Reports taking - Tresiba - Injecting 56 units daily - Humalog - Reports following TID sliding scale before meals, which she reports as 10 units with meals plus correction of 1 unit per 50 over 150 o Reports recent morning fasting CBGs ranging: 105-125 o Denies any recent low blood sugars - Discuss s/s of low and management of low. Patient reports keeps glucose tablets with her. . Discuss importance of blood pressure control and monitoring o Reports taking Bystolic 10 mg once daily o Encourage to follow direction as provided by Cardiologist . Discuss importance of cholesterol control o Reports recently stopped taking ezetimibe because had GI discomfort/diarrhea that she attributed to the medication, but states  planning to retry and see if GI symptom returns. o Reports taking rosuvastatin 10 mg once daily  . Discuss COVID-19 vaccination and continued importance of COVID-19 prevention o Reports planning to receive COVID-19 vaccination after mammogram appointment in July . Will follow up with PCP regarding patient's request for referral for further follow up about thyroid nodules  Patient Self Care Activities:  . Patient to contact providers for new medical questions/concerns . Patient to attend scheduled medical appointments o Next appointment with PCP on 6/22  Please see past updates related to this goal by clicking on the "Past Updates" button in the selected goal         Patient verbalizes understanding of instructions provided today.   The care management team will reach out to the patient again over the next 60 days.   Harlow Asa, PharmD, [place] [employer] [phone]

## 2019-10-29 ENCOUNTER — Other Ambulatory Visit: Payer: Self-pay

## 2019-10-29 ENCOUNTER — Ambulatory Visit: Payer: Medicare Other | Admitting: Physical Therapy

## 2019-10-29 DIAGNOSIS — M6281 Muscle weakness (generalized): Secondary | ICD-10-CM | POA: Diagnosis not present

## 2019-10-29 DIAGNOSIS — G8929 Other chronic pain: Secondary | ICD-10-CM | POA: Diagnosis not present

## 2019-10-29 DIAGNOSIS — M545 Low back pain: Secondary | ICD-10-CM | POA: Diagnosis not present

## 2019-10-29 NOTE — Therapy (Addendum)
[facility] [address]. [place], [place], [address] Phone: [phone]   Fax:  [phone]  Physical Therapy Treatment  Patient Details  Name: [name] MRN: [id number] Date of Birth: [birth year] Referring Provider (PT): Dr. Parks Ranger   Encounter Date: 10/22/2019  Treatment: 4 of 12.  Recert date: 0/0/9381 8299 to 1655  Past Medical History:  Diagnosis Date  . Anterolisthesis    Cervical spine  . Asthma   . Connective tissue disorder (HCC)    recurrent carotid arteritis, temporal arteritis, vasculitis mandible, general hepatitis, avascular necrosis bilat  . DDD (degenerative disc disease), lumbar   . Diabetes mellitus without complication ([place])   . Diverticulosis   . Duodenitis   . Gouty arthritis   . Hiatal hernia with GERD   . History of cardiovascular stress test    a. 07/2018 [facility]): Fixed inferoapical defect w/ nl contraction-->attenuation artifact. No ischemia. EF 63%.  . Hyperlipidemia   . Hypertension    a. 02/2019 Renal artery duplex: no evidence of prox RAS.  Marland Kitchen Hyperuricemia   . Keratitis sicca, bilateral ([place])   . Lymphedema of both lower extremities   . Osteoarthritis   . Pericarditis    Recurrent: Aug 97, July 05, Sept 08, July 16, July 18  . PUD (peptic ulcer disease)   . 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[place])   . Sjogren's syndrome ([place])   . Syncope   . Tendonitis, Achilles, right   . Tortuous colon   . Trigger finger     Past Surgical History:  Procedure Laterality Date  . ABDOMINAL HYSTERECTOMY    . BREAST BIOPSY    . BREAST EXCISIONAL BIOPSY Left 1986   neg  . CHOLECYSTECTOMY    . COLONOSCOPY WITH PROPOFOL N/A 08/26/2018   Procedure: COLONOSCOPY WITH PROPOFOL;  Surgeon: Lucilla Lame, MD;  Location: [facility] ENDOSCOPY;  Service: Endoscopy;  Laterality: N/A;  . CYSTOSCOPY  02/26/2018   [name], MD   . distal arthrectomy    . ESOPHAGOGASTRODUODENOSCOPY (EGD) WITH PROPOFOL N/A 08/26/2018    Procedure: ESOPHAGOGASTRODUODENOSCOPY (EGD) WITH PROPOFOL;  Surgeon: Lucilla Lame, MD;  Location: [facility] ENDOSCOPY;  Service: Endoscopy;  Laterality: N/A;  . EXCISION NEUROMA    . KNEE SURGERY Bilateral    1985, 2001, 11/2002, 12/2006  . panhysterectomy  10/1983  . SHOULDER SURGERY Right    reverse total arthroplasty w/ biceps tenodesis  . TONSILLECTOMY AND ADENOIDECTOMY    . TOTAL HIP ARTHROPLASTY Right 04/2007  . WISDOM TOOTH EXTRACTION      There were no vitals filed for this visit.  Subjective Assessment - 11/15/19 1508    Subjective  Pt. states she is hurting today and R knee really hurts with standing/walking tasks.  Increase R knee pain with //-bars walking (foward/backward/lateral).  Light UE assist to decrease pain/wt.bearing and moderate B lower leg swelling remains into ankles.  Limited seated trunk rotn./ inability to tolerate any manual tx. to R sh.    Pertinent History  Pt. from [place] and came to [place] to take care of friend and then Covid happened.  Pt. retired.  Pts. ex-husband lives in her house in [place].  R shoulder limitations (5# lifting restrictions).  Pt. went to [employer] for 10 years (every week) with benefits reported in low back.    Limitations  Standing;Walking    Patient Stated Goals  Decrease back pain/ improve strength/ mobility.    Currently in Pain?  Yes    Pain Location  Back  [facility] PT Assessment - 11/15/19 0001      Assessment   Medical Diagnosis  Osteoarthritis multiple joints/ Chronic low back pain.    Referring Provider (PT)  Dr. Parks Ranger    Onset Date/Surgical Date  06/12/19      Prior Function   Level of Independence  Independent      Cognition   Overall Cognitive Status  Within Functional Limits for tasks assessed        There.ex.:  Standing marching/ hip abduction 20x.  //-bars walking: forward/backwards/lateral 3x in //-bars (increase R knee pain) Supine marching/ bridging/ hip abduction (cuing about TrA contraction) Reviewed HEP    Manual tx.:  Supine LE/lumbar stretches (generalized) R sidelying L hip and low back STM/ manual tx. (as tolerated)- generalized tenderness Use of Hypervolt to R glut/ ITB/ hamstring/ paraspinals.     PT Long Term Goals - 11/15/19 1524      PT LONG TERM GOAL #1   Title  Pt. will be independent with HEP to increase B hip/LE muscle strengthening 1/2 muscle grade to improve pain-free mobility/ upright posture.    Baseline  Generalized B LE muscle weakness grossly 4/5 MMT (pain limited L hip/ R knee).    Time  8    Period  Weeks    Status  Not Met    Target Date  12/17/19      PT LONG TERM GOAL #2   Title  Pt. will increase FOTO to 58 to improve pain-free mobility.    Baseline  Initial FOTO: 53    Time  8    Period  Weeks    Status  On-going    Target Date  12/17/19      PT LONG TERM GOAL #3   Title  Pt. will demonstrate/ maintain proper upright posture with standing tolerance with no increase c/o back pain to improve cooking.    Baseline  Moderate rounded shoulders/ forward head posture.    Time  4    Period  Weeks    Status  Partially Met    Target Date  12/17/19      PT LONG TERM GOAL #4   Title  Pt. will report 4/10 low back pain at worst with no radicular symptoms to improve ADLs/ household chores.    Baseline  Pt. reports no pain in back currently at rest but >6/10 pain with increase activity.    Time  4    Period  Weeks    Status  Not Met    Target Date  12/17/19            Plan - 11/15/19 1516    Clinical Impression Statement  Pt. presents with moderate B lower leg/ ankle swelling and persistent knee pain with walking/ wt. bearing tasks.  Pt. continues to present with core/ LE muscle weakness and pain limited with position changes.  Pt. able to lie on R side during hip/low back STM and unable to tolerate prone position.  Pt. will continue to benefit from skilled PT services to increase strength/ improve pain-free mobility.    Stability/Clinical Decision  Making  Evolving/Moderate complexity    Clinical Decision Making  Moderate    Rehab Potential  Fair    PT Frequency  1x / week    PT Duration  8 weeks    PT Treatment/Interventions  ADLs/Self Care Home Management;Electrical Stimulation;Moist Heat;Gait training;Stair training;Functional mobility training;Neuromuscular re-education;Balance training;Therapeutic exercise;Therapeutic activities;Patient/family education;Manual techniques;Passive range of motion    PT Next  Visit Plan  Progress standing ther.ex.       Patient will benefit from skilled therapeutic intervention in order to improve the following deficits and impairments:  Abnormal gait, Pain, Improper body mechanics, Postural dysfunction, Decreased mobility, Decreased activity tolerance, Decreased endurance, Decreased range of motion, Decreased strength, Impaired UE functional use, Impaired flexibility, Difficulty walking  Visit Diagnosis: Chronic bilateral low back pain without sciatica  Muscle weakness (generalized)     Problem List Patient Active Problem List   Diagnosis Date Noted  . Thyroid nodule 09/29/2019  . Lymphedema 11/10/2018  . PAD (peripheral artery disease) ([place]) 10/12/2018  . Aortic atherosclerosis ([place]) 10/12/2018  . Carotid stenosis, asymptomatic, bilateral 10/12/2018  . Positive colorectal cancer screening using Cologuard test 10/09/2018  . Gastroesophageal reflux disease   . Acute gastritis without hemorrhage   . Osteoarthritis 04/29/2018  . Hiatal hernia with GERD 04/29/2018  . Hyperlipidemia 04/29/2018  . Lymphedema of both lower extremities 04/29/2018  . Left knee pain 10/23/2016  . Cataracts, bilateral 07/31/2016  . Glaucoma 07/31/2016  . Lateral epicondylitis of left elbow 10/20/2015  . Vitamin D deficiency 10/20/2015  . Type 2 diabetes, controlled, with neuropathy ([place]) 10/16/2015  . Gouty arthritis 06/03/2015  . H/O syncope 06/03/2015  . Mild intermittent asthma 06/03/2015  . Multiple  gastric ulcers 06/03/2015  . Schatzki's ring 06/03/2015  . Undifferentiated connective tissue disease ([place]) 06/03/2015  . Bone disorder 04/05/2015  . Dental injury 04/05/2015  . Pericarditis 04/05/2015  . Sjogren's syndrome ([place]) 09/07/2014  . Elevated alkaline phosphatase level 08/24/2014  . Lactose intolerance 08/24/2014  . Obesity 08/24/2014  . Peripheral edema 08/24/2014  . 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 Abdominal adhesions 08/02/2014  . 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[place]) 08/02/2014  . Degenerative joint disease (DJD) of lumbar spine 08/02/2014  . Diverticulosis of both small and large intestine 08/02/2014  . Hyperuricemia 08/02/2014  . Pure hypercholesterolemia 08/02/2014  . Trigger finger 08/02/2014  . Right shoulder pain 07/29/2014  . Benign neoplasm of colon 06/30/2010  . Right upper quadrant pain 06/30/2010   Pura Spice, PT, DPT # [id number] 11/15/2019, 3:27 PM  [facility] [address] [place], [place], [address] Phone: [phone]   Fax:  [phone]  Name: [name] MRN: [id number] Date of Birth: 02/26/1946

## 2019-10-29 NOTE — Therapy (Addendum)
[facility] [address] [facility] [address] [place], [place], [address] Phone: [phone]   Fax:  [phone]  Physical Therapy Treatment  Patient Details  Name: [name] MRN: [id number] Date of Birth: 27-Dec-1945 Referring Provider (PT): Dr. Parks Ranger   Encounter Date: 10/29/2019   Treatment: 5 of 12.  Recert date: 1/0/2111 7356 to 72   Past Medical History:  Diagnosis Date  . Anterolisthesis    Cervical spine  . Asthma   . Connective tissue disorder (HCC)    recurrent carotid arteritis, temporal arteritis, vasculitis mandible, general hepatitis, avascular necrosis bilat  . DDD (degenerative disc disease), lumbar   . Diabetes mellitus without complication ([place])   . Diverticulosis   . Duodenitis   . Gouty arthritis   . Hiatal hernia with GERD   . History of cardiovascular stress test    a. 07/2018 [facility]): Fixed inferoapical defect w/ nl contraction-->attenuation artifact. No ischemia. EF 63%.  . Hyperlipidemia   . Hypertension    a. 02/2019 Renal artery duplex: no evidence of prox RAS.  Marland Kitchen Hyperuricemia   . Keratitis sicca, bilateral ([place])   . Lymphedema of both lower extremities   . Osteoarthritis   . Pericarditis    Recurrent: Aug 97, July 05, Sept 08, July 16, July 18  . PUD (peptic ulcer disease)   . 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[place])   . Sjogren's syndrome ([place])   . Syncope   . Tendonitis, Achilles, right   . Tortuous colon   . Trigger finger     Past Surgical History:  Procedure Laterality Date  . ABDOMINAL HYSTERECTOMY    . BREAST BIOPSY    . BREAST EXCISIONAL BIOPSY Left 1986   neg  . CHOLECYSTECTOMY    . COLONOSCOPY WITH PROPOFOL N/A 08/26/2018   Procedure: COLONOSCOPY WITH PROPOFOL;  Surgeon: Lucilla Lame, MD;  Location: [facility] ENDOSCOPY;  Service: Endoscopy;  Laterality: N/A;  . CYSTOSCOPY  02/26/2018   [name], MD   . distal arthrectomy    . ESOPHAGOGASTRODUODENOSCOPY (EGD) WITH PROPOFOL N/A  08/26/2018   Procedure: ESOPHAGOGASTRODUODENOSCOPY (EGD) WITH PROPOFOL;  Surgeon: Lucilla Lame, MD;  Location: [facility] ENDOSCOPY;  Service: Endoscopy;  Laterality: N/A;  . EXCISION NEUROMA    . KNEE SURGERY Bilateral    1985, 2001, 11/2002, 12/2006  . panhysterectomy  10/1983  . SHOULDER SURGERY Right    reverse total arthroplasty w/ biceps tenodesis  . TONSILLECTOMY AND ADENOIDECTOMY    . TOTAL HIP ARTHROPLASTY Right 04/2007  . WISDOM TOOTH EXTRACTION      There were no vitals filed for this visit.   Subjective Assessment - 11/22/19 1450    Subjective No new complaints.  BP in seated position prior to tx. 190/77.  BP at end of tx.  190/72.  Pt. instructed to contact PCP.    Pertinent History Pt. from [place] and came to [place] to take care of friend and then Covid happened.  Pt. retired.  Pts. ex-husband lives in her house in [place].  R shoulder limitations (5# lifting restrictions).  Pt. went to [facility] for 10 years (every week) with benefits reported in low back.    Limitations Standing;Walking    Patient Stated Goals Decrease back pain/ improve strength/ mobility.    Currently in Pain? Yes            There.ex.:  Supine marching/ bridging/ hip abduction (cuing about TrA contraction) Rolling to L/R on mat table 2x.   Reviewed HEP   Manual  tx.:  Supine LE/lumbar stretches (generalized) R sidelying L hip and low back STM/ manual tx. (as tolerated)- generalized tenderness Use of Hypervolt to R glut/ ITB/ hamstring/ paraspinals.      PT Long Term Goals - 11/15/19 1524      PT LONG TERM GOAL #1   Title Pt. will be independent with HEP to increase B hip/LE muscle strengthening 1/2 muscle grade to improve pain-free mobility/ upright posture.    Baseline Generalized B LE muscle weakness grossly 4/5 MMT (pain limited L hip/ R knee).    Time 8    Period Weeks    Status Not Met    Target Date 12/17/19      PT LONG TERM GOAL #2   Title Pt. will increase FOTO to 58 to improve pain-free  mobility.    Baseline Initial FOTO: 53    Time 8    Period Weeks    Status On-going    Target Date 12/17/19      PT LONG TERM GOAL #3   Title Pt. will demonstrate/ maintain proper upright posture with standing tolerance with no increase c/o back pain to improve cooking.    Baseline Moderate rounded shoulders/ forward head posture.    Time 4    Period Weeks    Status Partially Met    Target Date 12/17/19      PT LONG TERM GOAL #4   Title Pt. will report 4/10 low back pain at worst with no radicular symptoms to improve ADLs/ household chores.    Baseline Pt. reports no pain in back currently at rest but >6/10 pain with increase activity.    Time 4    Period Weeks    Status Not Met    Target Date 12/17/19                 Plan - 11/22/19 1452    Clinical Impression Statement Pain limited/ generalized soft tissue tenderness in L low back/hip/LE during manual tx. in R sidelying position.  No standing or resisted/ exertional therex. secondary to elevated BP.  Pt. instructed to contact PCP to discuss elevated BP.    Stability/Clinical Decision Making Evolving/Moderate complexity    Clinical Decision Making Moderate    Rehab Potential Fair    PT Frequency 1x / week    PT Duration 8 weeks    PT Treatment/Interventions ADLs/Self Care Home Management;Electrical Stimulation;Moist Heat;Gait training;Stair training;Functional mobility training;Neuromuscular re-education;Balance training;Therapeutic exercise;Therapeutic activities;Patient/family education;Manual techniques;Passive range of motion    PT Next Visit Plan Progress standing ther.ex.           Patient will benefit from skilled therapeutic intervention in order to improve the following deficits and impairments:  Abnormal gait, Pain, Improper body mechanics, Postural dysfunction, Decreased mobility, Decreased activity tolerance, Decreased endurance, Decreased range of motion, Decreased strength, Impaired UE functional use,  Impaired flexibility, Difficulty walking  Visit Diagnosis: Chronic bilateral low back pain without sciatica  Muscle weakness (generalized)     Problem List Patient Active Problem List   Diagnosis Date Noted  . Thyroid nodule 09/29/2019  . Lymphedema 11/10/2018  . PAD (peripheral artery disease) ([place]) 10/12/2018  . Aortic atherosclerosis ([place]) 10/12/2018  . Carotid stenosis, asymptomatic, bilateral 10/12/2018  . Positive colorectal cancer screening using Cologuard test 10/09/2018  . Gastroesophageal reflux disease   . Acute gastritis without hemorrhage   . Osteoarthritis 04/29/2018  . Hiatal hernia with GERD 04/29/2018  . Hyperlipidemia 04/29/2018  . Lymphedema of both lower extremities 04/29/2018  .  Left knee pain 10/23/2016  . Cataracts, bilateral 07/31/2016  . Glaucoma 07/31/2016  . Lateral epicondylitis of left elbow 10/20/2015  . Vitamin D deficiency 10/20/2015  . Type 2 diabetes, controlled, with neuropathy ([place]) 10/16/2015  . Gouty arthritis 06/03/2015  . H/O syncope 06/03/2015  . Mild intermittent asthma 06/03/2015  . Multiple gastric ulcers 06/03/2015  . Schatzki's ring 06/03/2015  . Undifferentiated connective tissue disease ([place]) 06/03/2015  . Bone disorder 04/05/2015  . Dental injury 04/05/2015  . Pericarditis 04/05/2015  . Sjogren's syndrome ([place]) 09/07/2014  . Elevated alkaline phosphatase level 08/24/2014  . Lactose intolerance 08/24/2014  . Obesity 08/24/2014  . Peripheral edema 08/24/2014  . 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 Abdominal adhesions 08/02/2014  . 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[place]) 08/02/2014  . Degenerative joint disease (DJD) of lumbar spine 08/02/2014  . Diverticulosis of both small and large intestine 08/02/2014  . Hyperuricemia 08/02/2014  . Pure hypercholesterolemia 08/02/2014  . Trigger finger 08/02/2014  . Right shoulder pain 07/29/2014  . Benign neoplasm of colon 06/30/2010   . Right upper quadrant pain 06/30/2010   Pura Spice, PT, DPT # [phone] 11/22/2019, 3:02 PM  [facility] [address] [place], [place], [address] Phone: [phone]   Fax:  [phone]  Name: [name] MRN: [id number] Date of Birth: 08-05-1945

## 2019-10-31 ENCOUNTER — Other Ambulatory Visit: Payer: Self-pay | Admitting: Family Medicine

## 2019-10-31 DIAGNOSIS — E7800 Pure hypercholesterolemia, unspecified: Secondary | ICD-10-CM

## 2019-10-31 NOTE — Telephone Encounter (Signed)
Requested Prescriptions  Pending Prescriptions Disp Refills  . rosuvastatin (CRESTOR) 10 MG tablet [Pharmacy Med Name: ROSUVASTATIN CALCIUM 10 MG TAB] 30 tablet 0    Sig: TAKE 1 TABLET BY MOUTH EVERY DAY     Cardiovascular:  Antilipid - Statins Failed - 10/31/2019 10:52 AM      Failed - LDL in normal range and within 360 days    LDL Cholesterol (Calc)  Date Value Ref Range Status  02/10/2019 100 (H) mg/dL (calc) Final    Comment:    Reference range: <100 . Desirable range <100 mg/dL for primary prevention;   <70 mg/dL for patients with CHD or diabetic patients  with > or = 2 CHD risk factors. Marland Kitchen LDL-C is now calculated using the Martin-Hopkins  calculation, which is a validated novel method providing  better accuracy than the Friedewald equation in the  estimation of LDL-C.  Cresenciano Genre et al. Annamaria Helling. MU:[id number]): 2061-2068  (http://education.Quest[employer].com/faq/FAQ164)          Failed - HDL in normal range and within 360 days    HDL  Date Value Ref Range Status  02/10/2019 46 (L) > OR = 50 mg/dL Final         Passed - Total Cholesterol in normal range and within 360 days    Cholesterol  Date Value Ref Range Status  02/10/2019 169 <200 mg/dL Final         Passed - Triglycerides in normal range and within 360 days    Triglycerides  Date Value Ref Range Status  02/10/2019 129 <150 mg/dL Final         Passed - Patient is not pregnant      Passed - Valid encounter within last 12 months    Recent Outpatient Visits          1 month ago Thyroid nodule   [facility], [name], FNP   2 months ago Type 2 diabetes, controlled, with neuropathy [facility])   Lorenzo, DO   5 months ago Benign hypertension with CKD (chronic kidney disease) stage III   [facility], Devonne Doughty, DO   8 months ago Essential hypertension   [place], NP   10 months  ago RUQ pain   [facility] Merrilyn Puma, Jerrel Ivory, NP      Future Appointments            In 1 month [place], [name], [facility], [facility]

## 2019-11-02 ENCOUNTER — Ambulatory Visit: Payer: Self-pay

## 2019-11-02 ENCOUNTER — Telehealth: Payer: Self-pay | Admitting: Family Medicine

## 2019-11-02 ENCOUNTER — Other Ambulatory Visit: Payer: Self-pay | Admitting: *Deleted

## 2019-11-02 ENCOUNTER — Telehealth: Payer: Self-pay | Admitting: Cardiovascular Disease

## 2019-11-02 MED ORDER — NEBIVOLOL HCL 10 MG PO TABS: 10.0000 mg | ORAL_TABLET | Freq: Every day | ORAL | 5 refills | Status: DC

## 2019-11-02 NOTE — Telephone Encounter (Signed)
Patient called and says she's been waiting on a call back from the office, but in the meantime she says her BP is up at 208/105 at 1515. She says she takes Bystolic 10 mg daily and took it yesterday morning, then last night at 2130 her BP was 185/95. She says this morning at 0930 BP 137/76, she took the medication. Today at 1515 the BP was 208/105, so she took another Journalist, newspaper. She asks is it ok to take another one and how many is too many to take. I advised she shouldn't take more than prescribed and she says Dr. Candis Musa told her to take an additional one if her BP was up. I asked about other symptoms, she denies. I advised to go to the ED, she says Dr. Candis Musa told her not to go to the ED because all they will do is put her in a dark room to relax, then recheck her BP, because she's not able to take a lot of BP medications or fluid medicine due to her allergies. I called the office and spoke to [place], [facility] who placed [place], CMA on the phone. Kelita asked to speak to the patient, the call was connected successfully.  Reason for Disposition . [id number] Systolic BP  >= 0000000 OR Diastolic >= [id number] AND [id number] cardiac or neurologic symptoms (e.g., chest pain, difficulty breathing, unsteady gait, blurred vision)  Answer Assessment - Initial Assessment Questions 1. BLOOD PRESSURE: "What is the blood pressure?" "Did you take at least two measurements 5 minutes apart?"     1515-205/105 P 60; 1556-224/107 P 61 2. ONSET: "When did you take your blood pressure?"     Today  3. HOW: "How did you obtain the blood pressure?" (e.g., visiting nurse, automatic home BP monitor)     Automatic home BP monitor 4. HISTORY: "Do you have a history of high blood pressure?"     Yes 5. MEDICATIONS: "Are you taking any medications for blood pressure?" "Have you missed any doses recently?"     Yes, Bystolic, no missed doses 6. OTHER SYMPTOMS: "Do you have any symptoms?" (e.g., headache, chest pain, blurred vision, difficulty breathing,  weakness)     No 7. PREGNANCY: "Is there any chance you are pregnant?" "When was your last menstrual period?"     No  Protocols used: HIGH BLOOD PRESSURE-A-AH

## 2019-11-02 NOTE — Telephone Encounter (Signed)
Can we request a copy of the results of this thyroid ultrasound?  We haven't received yet for me to review.  Thanks

## 2019-11-02 NOTE — Telephone Encounter (Signed)
I spoke with Baker Janus and she reported that her blood pressure yesterday was 185/95. She increased her Bystolic 10MG  to twice daily on yesterday. She took her blood pressure this morning and it was 137/76. She repeated it again this afternoon and it was 224/107.  The pt wanted to know if she can take an extra Bystolic? She denies any other symptoms of headache, chest pain, blurry vision or numbness in any extremities. She state that Dr. Rockey Situ told her not to go to the ER if her blood pressure is elevated and that she should just manage it with her Bystolic since she have so many drug allergies. I informed her that her blood pressure is emergent issue and that she needs to seek emergency care. I also informed her that she can contact Dr. Rockey Situ office for an second opinion, but Nicoles recommendation is for her to go to the hospital. She verbalize understanding.

## 2019-11-02 NOTE — Telephone Encounter (Signed)
Pt c/o medication issue:  1. Name of Medication: Bystolic   2. How are you currently taking this medication (dosage and times per day)? 10 mg at 9 pm. Past few days pt has been taking extra  3. Are you having a reaction (difficulty breathing--STAT)? no  4. What is your medication issue? BP is running high ( 185/95 9:30 pm ) (137/76 9:30 am) (208/105 pm)   Patient wanting to know if she can take more medication, what she should do   Please advise

## 2019-11-02 NOTE — Telephone Encounter (Signed)
Patient is calling for advice from [place] regarding the nodules in her thryoid. What is the next step? How should she proceed. Cb- [phone] Can leave a vm

## 2019-11-02 NOTE — Telephone Encounter (Signed)
Requested Prescriptions   Signed Prescriptions Disp Refills  . nebivolol (BYSTOLIC) 10 MG tablet 30 tablet 5    Sig: Take 1 tablet (10 mg total) by mouth daily.    Authorizing Provider: Theora Gianotti    Ordering User: Britt Bottom

## 2019-11-03 ENCOUNTER — Other Ambulatory Visit: Payer: Self-pay | Admitting: Family Medicine

## 2019-11-03 DIAGNOSIS — E041 Nontoxic single thyroid nodule: Secondary | ICD-10-CM

## 2019-11-03 NOTE — Telephone Encounter (Signed)
Put in referral to endocrinology with [facility].  Thanks

## 2019-11-03 NOTE — Telephone Encounter (Signed)
The pt was notified, no question or concern.

## 2019-11-03 NOTE — Telephone Encounter (Signed)
The Thyroid US was given to [facility] for review.

## 2019-11-03 NOTE — Progress Notes (Signed)
Referral for endocrinology for thyroid nodule placed.

## 2019-11-03 NOTE — Telephone Encounter (Signed)
Spoke with patient and she reports that her blood pressures have been elevated. She reports that she is on Bystolic and it has not been helping. She did take extra pill this morning to help but she did want to inquire if there was a limitation on the dosage she could take. She has aneurysm and has been told that they want her pressures to be SBP 130's or less. Instructed her to take the bystolic twice a day and offered virtual appointment tomorrow with provider to review her concerns and other potential options for her blood pressure management and control. She was agreeable to virtual visit with Dr. Rockey Situ and scheduled this for her. Advised that someone would be calling prior to her appointment and then provider would call around the appointment time. She did consent to virtual telephone visit with provider and she had no further questions at this time.

## 2019-11-03 NOTE — Telephone Encounter (Signed)
Patient calling back since she was not called yesterday. Patient states she had a "BP crisis"   Please advise

## 2019-11-04 ENCOUNTER — Other Ambulatory Visit: Payer: Self-pay

## 2019-11-04 ENCOUNTER — Encounter: Payer: Self-pay | Admitting: Cardiovascular Disease

## 2019-11-04 ENCOUNTER — Telehealth: Payer: Self-pay

## 2019-11-04 ENCOUNTER — Telehealth (INDEPENDENT_AMBULATORY_CARE_PROVIDER_SITE_OTHER): Payer: Medicare Other | Admitting: Cardiovascular Disease

## 2019-11-04 VITALS — Ht 69.0 in | Wt 239.0 lb

## 2019-11-04 DIAGNOSIS — I700 Atherosclerosis of aorta: Secondary | ICD-10-CM

## 2019-11-04 DIAGNOSIS — I10 Essential (primary) hypertension: Secondary | ICD-10-CM

## 2019-11-04 DIAGNOSIS — I890 Lymphedema, not elsewhere classified: Secondary | ICD-10-CM | POA: Diagnosis not present

## 2019-11-04 DIAGNOSIS — I739 Peripheral vascular disease, unspecified: Secondary | ICD-10-CM

## 2019-11-04 DIAGNOSIS — E782 Mixed hyperlipidemia: Secondary | ICD-10-CM | POA: Diagnosis not present

## 2019-11-04 MED ORDER — CLONIDINE HCL 0.1 MG PO TABS: ORAL_TABLET | ORAL | 6 refills | Status: DC

## 2019-11-04 MED ORDER — LOSARTAN POTASSIUM 50 MG PO TABS: 50.0000 mg | ORAL_TABLET | Freq: Every day | ORAL | 6 refills | Status: DC

## 2019-11-04 MED ORDER — NEBIVOLOL HCL 10 MG PO TABS: 10.0000 mg | ORAL_TABLET | Freq: Two times a day (BID) | ORAL | 6 refills | Status: DC

## 2019-11-04 NOTE — Telephone Encounter (Signed)
Called and spoke with patient and she wanted to check with Dr. Raliegh Ip and see if he had any other endocrinologist that he would refer too?  [facility] called her today and did not have an appointment until 01/27/20.  She would like to see someone before then.  Please advise

## 2019-11-04 NOTE — Telephone Encounter (Signed)
Copied from [place] [phone]. Topic: General - Other >> Nov 04, 2019  2:09 PM Celene Kras wrote: Reason for CRM: Pt caleld and is requesting to speak with Carlita regarding her appt with the endocrinologist. Please advise .

## 2019-11-04 NOTE — Telephone Encounter (Signed)
[facility] in [place] would be next recommended.  No other local Endocrine in [place] area.  Only other one I know of would be [facility], I believe they have Endocrinology in [place] as well.  She can perhaps check with them to see wait times. And let us know which is preferred. It looks like Elmyra Ricks placed referral yesterday on 11/03/19.  Perhaps they can use existing referral and switch the location?  Nobie Putnam, [facility] 11/04/2019, 3:06 PM

## 2019-11-04 NOTE — Progress Notes (Signed)
Cardiology Office Note  Date:  11/04/2019   ID:  [name], [name] 1945-12-17, MRN [id number]  PCP:  Verl Bangs, FNP   Chief Complaint  Patient presents with  . Elevated blood pressures x 1 week    HPI:  [name] is a 74 year old woman with past medical history of Former smoker  PAD/carotid HTN urinary incontinence, chronic UTIs Morbid obesity Moderate to large hiatal hernia. bleeding ulcers x 3, Aortic atherosclerosis Presenting for  resistant hypertension, PAD  Last clinic visit "dont feel well", head issue, ABD bloating, Pants tight Chronic nausea  Blood pressure elevated,  Has been taking bystolic 10 BID, up from 10 daily  Going to PT, 190/77 at PT  At Home, 203/104, pulse high 50s, to 60s Pulse "good"  Home stress Husband with CVA, Not living at home  HCTZ intolerance Many medications give her nausea,  Many of the medications were started several years ago, has not retried them  CT chest at [facility] Mild atherosclerosis in the thoracic aorta.  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  Other past medical hx  long history of pericarditis, pericardial effusion Previously managed by [facility] Reports being treated with colchicine in the past, Also previously treated with long course of steroids for unspecified connective tissue disorder  CT scan 06/2018 Mild to moderate diffuse descending aortic atherosclerosis extending into the common iliac arteries Coronary calcification in the right coronary artery  Carotid u/s 05/2018 Less than 50% stenosis in the right and left internal carotid Arteries.   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), Syncope, Tendonitis, Achilles, right, Tortuous colon, and Trigger finger.  PSH:    Past Surgical History:  Procedure Laterality Date  . ABDOMINAL HYSTERECTOMY    . BREAST BIOPSY    . BREAST EXCISIONAL BIOPSY Left 1986   neg  . CHOLECYSTECTOMY    . COLONOSCOPY WITH PROPOFOL N/A 08/26/2018   Procedure: COLONOSCOPY WITH PROPOFOL;  Surgeon: Lucilla Lame, MD;  Location: [facility] ENDOSCOPY;  Service: Endoscopy;  Laterality: N/A;  . CYSTOSCOPY  02/26/2018   [name], MD   . distal arthrectomy    . ESOPHAGOGASTRODUODENOSCOPY (EGD) WITH PROPOFOL N/A 08/26/2018   Procedure: ESOPHAGOGASTRODUODENOSCOPY (EGD) WITH PROPOFOL;  Surgeon: Lucilla Lame, MD;  Location[facility] END[facility]COPY;  Service: Endoscopy;  Laterality: N/A;  . EXCISION NEUROMA    . KNEE SURGERY Bilateral    1985, 2001, 11/2002, 12/2006  . panhysterectomy  10/1983  . SHOULDER SURGERY Right    reverse total arthroplasty w/ biceps tenodesis  . TONSILLECTOMY AND ADENOIDECTOMY    . TOTAL HIP ARTHROPLASTY Right 04/2007  . WISDOM TOOTH EXTRACTION      Current Outpatient Medications  Medication Sig Dispense Refill  . acetaminophen (TYLENOL) 500 MG tablet Take 500 mg by mouth daily as needed.    . ARTIFICIAL TEAR OP Apply to eye.    . BD INSULIN SYRINGE U/F 31G X 5/16" 1 ML MISC USE AS DIRECTED.  WITH HUMALOG 100 each 5  . BIOTIN PO Take by mouth daily.    . Cholecalciferol (VITAMIN D3) 25 MCG (1000 UT) CAPS Take 4 capsules by mouth daily.     . clindamycin (CLEOCIN) 300 MG capsule Take 600 mg by mouth. 1 Hour before dental procedures    . COLCRYS 0.6 MG  tablet Take 1 tablet (0.6 mg total) by mouth 2 (two) times daily as needed. For up to 3 days for pericarditis, or up to 7 days for gout or connective tissue disorder. 60 tablet 2  . cyclobenzaprine (FLEXERIL) 5 MG tablet TAKE 1 TABLET (5 MG TOTAL) BY MOUTH ONCE DAILY AS NEEDED FOR MUSCLE SPASMS 30 tablet 2  . famotidine (PEPCID) 20 MG tablet Take 20 mg by mouth 2 (two) times daily.    Marland Kitchen FREESTYLE LITE test strip USE AS DIRECTED THREE TIMES DAILY FOR DIABETES 100 strip 4  . Insulin Degludec (TRESIBA FLEXTOUCH) 200 UNIT/ML SOPN Inject 56 Units into the skin daily. 3 pen 11  . insulin lispro (HUMALOG) 100 UNIT/ML injection Correction scale: take 1 unit per 50 over 150 before meals up to three times daily.  Up to 20 units per day. 20 mL 1  . Insulin Pen Needle (PEN NEEDLES) 32G X 5 MM MISC 1 Device by Does not apply route daily. 100 each 3  . lactase (LACTAID) 3000 units tablet Take by mouth as needed.     . Lancets (FREESTYLE) lancets Must test x4/day    . nebivolol (BYSTOLIC) 10 MG tablet Take 1 tablet (10 mg total) by mouth in the morning and at bedtime. 60 tablet 6  . prednisoLONE acetate (PRED FORTE) 1 % ophthalmic suspension INSTILL ONE DROP  as needed    . rosuvastatin (CRESTOR) 10 MG tablet TAKE 1 TABLET BY MOUTH EVERY DAY 30 tablet 0  . ezetimibe (ZETIA) 10 MG tablet Take 1 tablet (10 mg total) by mouth daily. (Patient not taking: Reported on 10/28/2019) 90 tablet 3   No current facility-administered medications for this visit.     Allergies:   Amlodipine, Aspirin, Bee venom, Ciprofloxacin, Codeine, Erythromycin, Glimepiride, Hydralazine, Hydrochlorothiazide, Hydrocodone, Hydrocodone-acetaminophen, Hydromorphone, Irbesartan, Irbesartan-hydrochlorothiazide, Lisinopril, Maxitrol [neomycin-polymyxin-dexameth], Metformin and related, Metformin hcl, Methylcellulose, Metoclopramide, Metoprolol, Neomycin-bacitracin zn-polymyx, Norvasc [amlodipine besylate], Nsaids, Omeprazole magnesium, Other,  Oxycodone-acetaminophen, Penicillins, Pioglitazone, Poison sumac extract, Prilosec [omeprazole], Silver sulfadiazine, Sulfa antibiotics, Tape, Tetanus toxoid, Tramadol, and Betadine [povidone iodine]   Social History:  The patient  reports that she quit smoking about 23 years ago. She has never used smokeless tobacco. She reports current alcohol use. She reports that she does not use drugs.   Family History:   family history is not on file.    Review of Systems: Review of Systems  Constitutional: Positive for malaise/fatigue.  HENT: Negative.   Respiratory: Negative.   Cardiovascular: Negative.   Gastrointestinal: Positive for nausea.  Musculoskeletal: Negative.   Neurological: Negative.   Psychiatric/Behavioral: Negative.   All other systems reviewed and are negative.   PHYSICAL EXAM: VS:  Ht 5\' 9"  (1.753 m)   Wt 239 lb (108.4 kg)   BMI 35.29 kg/m  , BMI Body mass index is 35.29 kg/m.  Constitutional:  oriented to person, place, and time. No distress.     Recent Labs: 02/10/2019: ALT 15; BUN 13; Creat 0.99; Hemoglobin 14.8; Platelets 319; Potassium 5.0; Sodium 141 10/07/2019: TSH 0.69    Lipid Panel Lab Results  Component Value Date   CHOL 169 02/10/2019   HDL 46 (L) 02/10/2019  LDLCALC 100 (H) 02/10/2019   TRIG 129 02/10/2019    Wt Readings from Last 3 Encounters:  11/04/19 239 lb (108.4 kg)  09/29/19 240 lb 12.8 oz (109.2 kg)  08/31/19 241 lb 9.6 oz (109.6 kg)      ASSESSMENT AND PLAN:  Aortic atherosclerosis (HCC) Recommend she stay on Crestor 10 mg daily Possible side effects to zetia, whole body hurting  PAD (peripheral artery disease) (HCC) aortic atherosclerosis, carotid calcification crestor as above, unclear if able to tolerate zetia  Essential hypertension Given extensive list of medication intolerances,  Difficult to manage Will try losartan 25 mg daily, slow titration upwards as tolerated Stay on bystolic 10 BID -Discussed also getting her  in emergency pill for high red towards the room Recommended he declined 0.1 mg as needed for systolic pressure 0000000 If she tolerates clonidine potentially Conty 0 twice daily for blood pressure She has not tried this medication for  Pure hypercholesterolemia Crestor 10  She will retry zetia next week  Lymphedema - Reports that she uses her lymphedema compression pumps Recommend compression hose  Abdominal swelling Reports sulfa allergy, no diuretics started Will not start HCTZ  Disposition:   F/U  3 months   Total encounter time more than 25 minutes  Greater than 50% was spent in counseling and coordination of care with the patient    No orders of the defined types were placed in this encounter.    Signed, Esmond Plants, M.D., Ph.D. 11/04/2019  [place], [place]

## 2019-11-04 NOTE — Patient Instructions (Addendum)
[name], it was nice speaking with you by phone today!   Medication Instructions:  1) Please try losartan 50 mg daily Try 1/2 pill for the first week,  Then titration up whole pill  2) Stay on bystolic 10 mg twice a day  Samples given:  Bystolic 10 mg Lot: [id number] Exp: 2/22 1 box given  3) Please take clonidine 0.1 mg twice a day as needed for SBP >160  If you need a refill on your cardiac medications before your next appointment, please call your pharmacy.    Lab work: No new labs needed   If you have labs (blood work) drawn today and your tests are completely normal, you will receive your results only by: Marland Kitchen MyChart Message (if you have MyChart) OR . A paper copy in the mail If you have any lab test that is abnormal or we need to change your treatment, we will call you to review the results.   Testing/Procedures: No new testing needed   Follow-Up: At [facility], you and your health needs are our priority.  As part of our continuing mission to provide you with exceptional heart care, we have created designated Provider Care Teams.  These Care Teams include your primary Cardiologist (physician) and Advanced Practice Providers (APPs -  Physician Assistants and Nurse Practitioners) who all work together to provide you with the care you need, when you need it.  . You will need a follow up appointment in 1 month  . Providers on your designated Care Team:   . Patricia Hodgkins, NP . Patricia Faith, PA-C . Patricia Mood, PA-C  Any Other Special Instructions Will Be Listed Below (If Applicable).  For educational health videos Log in to : [email or web address] Or : [email or web address], password : [email or web address]

## 2019-11-05 ENCOUNTER — Ambulatory Visit: Payer: Medicare Other | Admitting: Physical Therapy

## 2019-11-05 ENCOUNTER — Other Ambulatory Visit: Payer: Self-pay

## 2019-11-05 DIAGNOSIS — G8929 Other chronic pain: Secondary | ICD-10-CM

## 2019-11-05 DIAGNOSIS — M6281 Muscle weakness (generalized): Secondary | ICD-10-CM | POA: Diagnosis not present

## 2019-11-05 DIAGNOSIS — M545 Low back pain: Secondary | ICD-10-CM | POA: Diagnosis not present

## 2019-11-05 NOTE — Telephone Encounter (Signed)
Patient calling to let pam know Bystolic was able to be picked up and she will not need samples .     Patient reports being tired and wants to discuss this and bp issues with nurse.   Please call .

## 2019-11-06 NOTE — Telephone Encounter (Signed)
Spoke with patient and she reported blood pressure this AM at 160/80. Scheduled patient to come in and be seen next week on 11/10/19 at 09:00 AM due to her persistent blood pressures. Advised to arrive early to get through [place] entrance at this time. She was agreeable with this appointment and had no further questions at this time. She verbalized understanding of our conversation, agreement with plan, and had no further questions at this time.

## 2019-11-06 NOTE — Telephone Encounter (Signed)
Spoke with patient and gave her the number to other recommendation.  She will let us know if she wants referral.

## 2019-11-07 NOTE — Therapy (Addendum)
[facility] [address] [place], [place], [address] Phone: [phone]   Fax:  [phone]  Physical Therapy Treatment  Patient Details  Name: [name] MRN: [id number] Date of Birth: 1945/12/10 Referring Provider (PT): Dr. Parks Ranger   Encounter Date: 11/05/2019  Treatment: 6 of 12.  Recert date: 08/11/4399 0272 to 44   Past Medical History:  Diagnosis Date  . Anterolisthesis    Cervical spine  . Asthma   . Connective tissue disorder (HCC)    recurrent carotid arteritis, temporal arteritis, vasculitis mandible, general hepatitis, avascular necrosis bilat  . DDD (degenerative disc disease), lumbar   . Diabetes mellitus without complication ([place])   . Diverticulosis   . Duodenitis   . Gouty arthritis   . Hiatal hernia with GERD   . History of cardiovascular stress test    a. 07/2018 [facility]): Fixed inferoapical defect w/ nl contraction-->attenuation artifact. No ischemia. EF 63%.  . Hyperlipidemia   . Hypertension    a. 02/2019 Renal artery duplex: no evidence of prox RAS.  Marland Kitchen Hyperuricemia   . Keratitis sicca, bilateral ([place])   . Lymphedema of both lower extremities   . Osteoarthritis   . Pericarditis    Recurrent: Aug 97, July 05, Sept 08, July 16, July 18  . PUD (peptic ulcer disease)   . 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 Sjogren's syndrome ([place])   . Syncope   . Tendonitis, Achilles, right   . Tortuous colon   . Trigger finger     Past Surgical History:  Procedure Laterality Date  . ABDOMINAL HYSTERECTOMY    . BREAST BIOPSY    . BREAST EXCISIONAL BIOPSY Left 1986   neg  . CHOLECYSTECTOMY    . COLONOSCOPY WITH PROPOFOL N/A 08/26/2018   Procedure: COLONOSCOPY WITH PROPOFOL;  Surgeon: Lucilla Lame, MD;  Location[facility] ENDOSCOPY;  Service: Endoscopy;  Laterality: N/A;  . CYSTOSCOPY  02/26/2018   [name], MD   . distal arthrectomy    . ESOPHAGOGASTRODUODENOSCOPY (EGD) WITH PROPOFOL N/A 08/26/2018    Procedure: ESOPHAGOGASTRODUODENOSCOPY (EGD) WITH PROPOFOL;  Surgeon: Lucilla Lame, MD;  Location: [facility] ENDOSCOPY;  Service: Endoscopy;  Laterality: N/A;  . EXCISION NEUROMA    . KNEE SURGERY Bilateral    1985, 2001, 11/2002, 12/2006  . panhysterectomy  10/1983  . SHOULDER SURGERY Right    reverse total arthroplasty w/ biceps tenodesis  . TONSILLECTOMY AND ADENOIDECTOMY    . TOTAL HIP ARTHROPLASTY Right 04/2007  . WISDOM TOOTH EXTRACTION      There were no vitals filed for this visit.   Subjective Assessment - 11/22/19 2028    Subjective Pt. reports increase R knee pain and tenderness with light palpation.  Pt. states she has 10/10 low back pain at this time prior to PT tx. session.  No change in status reported to increase low back pain.  Pt. states she had a change in BP meds since last PT visit.  BP: 175/71 prior to tx.    Pertinent History Pt. from [place] and came to [place] to take care of friend and then Covid happened.  Pt. retired.  Pts. ex-husband lives in her house in [place].  R shoulder limitations (5# lifting restrictions).  Pt. went to [employer] for 10 years (every week) with benefits reported in low back.    Limitations Standing;Walking    Patient Stated Goals Decrease back pain/ improve strength/ mobility.    Currently in Pain? Yes    Pain  Score 10-Worst pain ever    Pain Location Back    Pain Orientation Lower             There.ex.:  Supine marching/ bridging/ hip abduction (cuing about TrA contraction) Rolling to L/R on mat table 2x.   Reviewed HEP  Manual tx.:  Supine LE/lumbar stretches (generalized) R sidelying L hip and low back STM/ manual tx. (as tolerated)- generalized tenderness Use of Hypervolt to R glut/ ITB/ hamstring/ paraspinals.      PT Long Term Goals - 11/15/19 1524      PT LONG TERM GOAL #1   Title Pt. will be independent with HEP to increase B hip/LE muscle strengthening 1/2 muscle grade to improve pain-free mobility/ upright posture.     Baseline Generalized B LE muscle weakness grossly 4/5 MMT (pain limited L hip/ R knee).    Time 8    Period Weeks    Status Not Met    Target Date 12/17/19      PT LONG TERM GOAL #2   Title Pt. will increase FOTO to 58 to improve pain-free mobility.    Baseline Initial FOTO: 53    Time 8    Period Weeks    Status On-going    Target Date 12/17/19      PT LONG TERM GOAL #3   Title Pt. will demonstrate/ maintain proper upright posture with standing tolerance with no increase c/o back pain to improve cooking.    Baseline Moderate rounded shoulders/ forward head posture.    Time 4    Period Weeks    Status Partially Met    Target Date 12/17/19      PT LONG TERM GOAL #4   Title Pt. will report 4/10 low back pain at worst with no radicular symptoms to improve ADLs/ household chores.    Baseline Pt. reports no pain in back currently at rest but >6/10 pain with increase activity.    Time 4    Period Weeks    Status Not Met    Target Date 12/17/19                 Plan - 11/22/19 2038    Clinical Impression Statement Pt. remains very limited with manual tx. due to significant c/o tenderness/ pain with light palpation along low back/ L hip/ LE.  Pts. BP remains too high to performs resisted/ exertional tasks.  Pt. remains in supine/ sidelying position t/o tx. session.    Stability/Clinical Decision Making Evolving/Moderate complexity    Clinical Decision Making Moderate    Rehab Potential Fair    PT Frequency 1x / week    PT Duration 8 weeks    PT Treatment/Interventions ADLs/Self Care Home Management;Electrical Stimulation;Moist Heat;Gait training;Stair training;Functional mobility training;Neuromuscular re-education;Balance training;Therapeutic exercise;Therapeutic activities;Patient/family education;Manual techniques;Passive range of motion    PT Next Visit Plan Progress standing ther.ex.  Recheck BP.           Patient will benefit from skilled therapeutic intervention in  order to improve the following deficits and impairments:  Abnormal gait, Pain, Improper body mechanics, Postural dysfunction, Decreased mobility, Decreased activity tolerance, Decreased endurance, Decreased range of motion, Decreased strength, Impaired UE functional use, Impaired flexibility, Difficulty walking  Visit Diagnosis: Chronic bilateral low back pain without sciatica  Muscle weakness (generalized)     Problem List Patient Active Problem List   Diagnosis Date Noted  . Thyroid nodule 09/29/2019  . Lymphedema 11/10/2018  . PAD (peripheral artery disease) ([place]) 10/12/2018  .  Aortic atherosclerosis ([place]) 10/12/2018  . Carotid stenosis, asymptomatic, bilateral 10/12/2018  . Positive colorectal cancer screening using Cologuard test 10/09/2018  . Gastroesophageal reflux disease   . Acute gastritis without hemorrhage   . Osteoarthritis 04/29/2018  . Hiatal hernia with GERD 04/29/2018  . Hyperlipidemia 04/29/2018  . Lymphedema of both lower extremities 04/29/2018  . Left knee pain 10/23/2016  . Cataracts, bilateral 07/31/2016  . Glaucoma 07/31/2016  . Lateral epicondylitis of left elbow 10/20/2015  . Vitamin D deficiency 10/20/2015  . Type 2 diabetes, controlled, with neuropathy ([place]) 10/16/2015  . Gouty arthritis 06/03/2015  . H/O syncope 06/03/2015  . Mild intermittent asthma 06/03/2015  . Multiple gastric ulcers 06/03/2015  . Schatzki's ring 06/03/2015  . Undifferentiated connective tissue disease ([place]) 06/03/2015  . Bone disorder 04/05/2015  . Dental injury 04/05/2015  . Pericarditis 04/05/2015  . Sjogren's syndrome ([place]) 09/07/2014  . Elevated alkaline phosphatase level 08/24/2014  . Lactose intolerance 08/24/2014  . Obesity 08/24/2014  . Peripheral edema 08/24/2014  . 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 Abdominal adhesions 08/02/2014  . 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[place]) 08/02/2014  . Degenerative joint disease  (DJD) of lumbar spine 08/02/2014  . Diverticulosis of both small and large intestine 08/02/2014  . Hyperuricemia 08/02/2014  . Pure hypercholesterolemia 08/02/2014  . Trigger finger 08/02/2014  . Right shoulder pain 07/29/2014  . Benign neoplasm of colon 06/30/2010  . Right upper quadrant pain 06/30/2010   Pura Spice, PT, DPT # [phone] 11/22/2019, 8:41 PM  [place] [facility] [address] [place], [place], [address] Phone: [phone]   Fax:  [phone]  Name: [name] MRN: [id number] Date of Birth: 13-Jun-1945

## 2019-11-10 ENCOUNTER — Ambulatory Visit (INDEPENDENT_AMBULATORY_CARE_PROVIDER_SITE_OTHER): Payer: Medicare Other | Admitting: Family

## 2019-11-10 ENCOUNTER — Encounter: Payer: Self-pay | Admitting: Family

## 2019-11-10 ENCOUNTER — Other Ambulatory Visit: Payer: Self-pay

## 2019-11-10 VITALS — BP 140/72 | HR 63 | Ht 69.0 in | Wt 240.1 lb

## 2019-11-10 DIAGNOSIS — I10 Essential (primary) hypertension: Secondary | ICD-10-CM

## 2019-11-10 DIAGNOSIS — I739 Peripheral vascular disease, unspecified: Secondary | ICD-10-CM

## 2019-11-10 DIAGNOSIS — I890 Lymphedema, not elsewhere classified: Secondary | ICD-10-CM

## 2019-11-10 DIAGNOSIS — E782 Mixed hyperlipidemia: Secondary | ICD-10-CM

## 2019-11-10 NOTE — Patient Instructions (Addendum)
Medication Instructions:  Your physician has recommended you make the following change in your medication:   INCREASE your Losartan to 50mg  (one tablet) daily *recommend taking it at a consistent time each day*  CONTINUE to use Clonidine as needed for systolic blood pressure greater than 160  *If you need a refill on your cardiac medications before your next appointment, please call your pharmacy*  Lab Work: None today.   Testing/Procedures: Your EKG today shows normal sinus rhythm.   Follow-Up: At [facility], you and your health needs are our priority.  As part of our continuing mission to provide you with exceptional heart care, we have created designated Provider Care Teams.  These Care Teams include your primary Cardiologist (physician) and Advanced Practice Providers (APPs -  Physician Assistants and Nurse Practitioners) who all work together to provide you with the care you need, when you need it.  We recommend signing up for the patient portal called "MyChart".  Sign up information is provided on this After Visit Summary.  MyChart is used to connect with patients for Virtual Visits (Telemedicine).  Patients are able to view lab/test results, encounter notes, upcoming appointments, etc.  Non-urgent messages can be sent to your provider as well.   To learn more about what you can do with MyChart, go to [email or web address].    Your next appointment:   In 1 month  Other Instructions  Tips to Measure your Blood Pressure Correctly  To determine whether you have hypertension, a medical professional will take a blood pressure reading. How you prepare for the test, the position of your arm, and other factors can change a blood pressure reading by 10% or more. That could be enough to hide high blood pressure, start you on a drug you don't really need, or lead your doctor to incorrectly adjust your medications.  National and international guidelines offer specific instructions for  measuring blood pressure. If a doctor, nurse, or medical assistant isn't doing it right, don't hesitate to ask him or her to get with the guidelines.  Here's what you can do to ensure a correct reading: . Don't drink a caffeinated beverage or smoke during the 30 minutes before the test. . Sit quietly for five minutes before the test begins. . During the measurement, sit in a chair with your feet on the floor and your arm supported so your elbow is at about heart level. . The inflatable part of the cuff should completely cover at least 80% of your upper arm, and the cuff should be placed on bare skin, not over a shirt. . Don't talk during the measurement. . Have your blood pressure measured twice, with a brief break in between. If the readings are different by 5 points or more, have it done a third time.  In 2017, new guidelines from the [place], the [employer] of Cardiology, and nine other health organizations lowered the diagnosis of high blood pressure to 130/80 mm Hg or higher for all adults. The guidelines also redefined the various blood pressure categories to now include normal, elevated, Stage 1 hypertension, Stage 2 hypertension, and hypertensive crisis (see "Blood pressure categories").  Blood pressure categories  Blood pressure category SYSTOLIC (upper number)  DIASTOLIC (lower number)  Normal Less than 120 mm Hg and Less than 80 mm Hg  Elevated 120-129 mm Hg and Less than 80 mm Hg  High blood pressure: Stage 1 hypertension 130-139 mm Hg or 80-89 mm Hg  High blood pressure: Stage 2  hypertension 140 mm Hg or higher or 90 mm Hg or higher  Hypertensive crisis (consult your doctor immediately) Higher than 180 mm Hg and/or Higher than 120 mm Hg  Source: American Heart Association and American Stroke Association. For more on getting your blood pressure under control, buy Controlling Your Blood Pressure, a Special Health Report from [facility].   Blood  Pressure Log   Date   Time  Blood Pressure  Position  Example: Nov 1 9 AM 124/78 sitting

## 2019-11-10 NOTE — Progress Notes (Signed)
Office Visit    Patient Name: [name] Date of Encounter: 11/10/2019  Primary Care Provider:  Verl Bangs, FNP Primary Cardiologist:  Ida Rogue, MD Electrophysiologist:  None   Chief Complaint    [name] is a 74 y.o. female with a hx of HTN, PAD, carotid artery disease, former tobacco use, morbid obesity, moderate to large hiatal hernia, aortic atherosclerosis, pericarditis presents today for elevated blood pressure  Past Medical History    Past Medical History:  Diagnosis Date  . Anterolisthesis    Cervical spine  . Asthma   . Connective tissue disorder (HCC)    recurrent carotid arteritis, temporal arteritis, vasculitis mandible, general hepatitis, avascular necrosis bilat  . DDD (degenerative disc disease), lumbar   . Diabetes mellitus without complication ([place])   . Diverticulosis   . Duodenitis   . Gouty arthritis   . Hiatal hernia with GERD   . History of cardiovascular stress test    a. 07/2018 [facility]): Fixed inferoapical defect w/ nl contraction-->attenuation artifact. No ischemia. EF 63%.  . Hyperlipidemia   . Hypertension    a. 02/2019 Renal artery duplex: no evidence of prox RAS.  Marland Kitchen Hyperuricemia   . Keratitis sicca, bilateral ([place])   . Lymphedema of both lower extremities   . Osteoarthritis   . Pericarditis    Recurrent: Aug 97, July 05, Sept 08, July 16, July 18  . PUD (peptic ulcer disease)   . 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[place])   . Sjogren's syndrome ([place])   . Syncope   . Tendonitis, Achilles, right   . Tortuous colon   . Trigger finger    Past Surgical History:  Procedure Laterality Date  . ABDOMINAL HYSTERECTOMY    . BREAST BIOPSY    . BREAST EXCISIONAL BIOPSY Left 1986   neg  . CHOLECYSTECTOMY    . COLONOSCOPY WITH PROPOFOL N/A 08/26/2018   Procedure: COLONOSCOPY WITH PROPOFOL;  Surgeon: Lucilla Lame, MD;  Location[facility] ENDOSCOPY;  Service: Endoscopy;  Laterality: N/A;  . CYSTOSCOPY  02/26/2018   [name], MD    . distal arthrectomy    . ESOPHAGOGASTRODUODENOSCOPY (EGD) WITH PROPOFOL N/A 08/26/2018   Procedure: ESOPHAGOGASTRODUODENOSCOPY (EGD) WITH PROPOFOL;  Surgeon: Lucilla Lame, MD;  Location: [facility] ENDOSCOPY;  Service: Endoscopy;  Laterality: N/A;  . EXCISION NEUROMA    . KNEE SURGERY Bilateral    1985, 2001, 11/2002, 12/2006  . panhysterectomy  10/1983  . SHOULDER SURGERY Right    reverse total arthroplasty w/ biceps tenodesis  . TONSILLECTOMY AND ADENOIDECTOMY    . TOTAL HIP ARTHROPLASTY Right 04/2007  . WISDOM TOOTH EXTRACTION      Allergies  Allergies  Allergen Reactions  . Amlodipine     Other reaction(s): Unknown  . Aspirin     Other reaction(s): Unknown  . Bee Venom   . Ciprofloxacin     Other reaction(s): Other (see comments), Unknown  . Codeine     Other reaction(s): Unknown Must have pre medications before taking per patient report Includes all derivatives   . Erythromycin     Other reaction(s): Unknown, Unknown  . Glimepiride     Other reaction(s): Unknown  . Hydralazine     Other reaction(s): Unknown  . Hydrochlorothiazide     Other reaction(s): Dizziness or lightheadedness.  . Hydrocodone     Other reaction(s): Unknown Must have pre medications before taking per patient report  . Hydrocodone-Acetaminophen Nausea And Vomiting    MUST BE PREMEDICATED  . Hydromorphone  Nausea And Vomiting    Other reaction(s): Unknown Must have pre medications before taking per patient report MUST BE PREMEDICATED   . Irbesartan     Other reaction(s): Unknown  . Irbesartan-Hydrochlorothiazide     Other reaction(s): Unknown  . Lisinopril     Other reaction(s): Unknown  . Maxitrol [Neomycin-Polymyxin-Dexameth]   . Metformin And Related   . Metformin Hcl     Other reaction(s): Unknown  . Methylcellulose     Other reaction(s): Unknown  . Metoclopramide Nausea And Vomiting    Other reaction(s): Unknown  . Metoprolol     Other reaction(s): Unknown  . Neomycin-Bacitracin  Zn-Polymyx     Other reaction(s): Unknown  . Norvasc [Amlodipine Besylate]   . Nsaids Other (See Comments)    Other reaction(s): Unknown bleeding   . Omeprazole Magnesium     Other reaction(s): Unknown  . Other     Pt is allergic to staples and surgical metals  . Oxycodone-Acetaminophen     Other reaction(s): Unknown, Unknown Must have pre medications before taking per patient report   . Penicillins     Other reaction(s): Unknown, Unknown  . Pioglitazone     Other reaction(s): Unknown  . Poison [employer]     Poison [place] and [place] also  . Prilosec [Omeprazole]   . Silver Sulfadiazine     Other reaction(s): Unknown  . Sulfa Antibiotics     Other reaction(s): Unknown, Unknown  . Tape     Other reaction(s): Unknown  . Tetanus Toxoid Other (See Comments)  . Tramadol     Other reaction(s): Unknown Must have pre medications before taking per patient report  . Betadine [Povidone Iodine] Rash    Only topically when left on skin.    History of Present Illness    [name] is a 74 y.o. 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  She was last seen 11/04/2019 via telemedicine by Dr. Rockey Situ.  History of multiple antihypertensives.  Including HCTZ, Amlodipine, Hydralazine, Lisinopril, Metoprolol.  During virtual visit 11/04/2019 she was recommended to start losartan 25 mg daily and continue her Bystolic 10 mg daily.  She was also given clonidine 0.1 mg as needed for breakthrough hypertension.  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  Recommended for repeat CT chest in 1 year.  Goal blood pressure less than 130/85 per their documentation.  Has been taking Losartan the half tablet 12.5 mg daily. Tells me she has had  headaches in the top left part of her head when her blood pressure is high. Tells me her blood pressure will drop quickly.   BP this morning 172/87. Yesterday her BP when waking was 176/91 and went down to 139/73. Had one reading of 102/54 and did feel poorly.  Has used as-needed Clonidine periodically.   EKGs/Labs/Other Studies Reviewed:   The following studies were reviewed today:  CT chest 09/10/19 at [facility] Impression:   1. No evidence for acute aortic pathology. No CT evidence for vasculitis.  2. Mild atherosclerosis in the thoracic aorta. 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   3. Calcified mediastinal and hilar adenopathy. Several calcified nodules  in the lungs and the spleen. Findings are in keeping with prior granulomatous infection.  4. Right thyroid nodule measuring up to 2 cm. Consider dedicated thyroid ultrasound.  EKG:  EKG is ordered today.  The ekg ordered today demonstrates NSR 63 bpmw ith no acute ST/T wave changes.   Recent Labs: 02/10/2019: ALT 15; BUN 13; Creat 0.99; Hemoglobin 14.8; Platelets 319; Potassium 5.0; Sodium 141 10/07/2019: TSH 0.69  Recent Lipid Panel    Component Value Date/Time   CHOL 169 02/10/2019 1053   TRIG 129 02/10/2019 1053   HDL 46 (L) 02/10/2019 1053   CHOLHDL 3.7 02/10/2019 1053   LDLCALC 100 (H) 02/10/2019 1053   Home Medications   Current Meds  Medication Sig  . acetaminophen (TYLENOL) 500 MG tablet Take 500 mg by mouth daily as needed.  . ARTIFICIAL TEAR OP Apply to eye as needed.   . BD INSULIN SYRINGE U/F 31G X 5/16" 1 ML MISC USE AS DIRECTED. WITH HUMALOG  . BIOTIN PO Take by mouth daily.  . Cholecalciferol (VITAMIN D3) 25 MCG (1000 UT) CAPS Take 4 capsules by mouth daily.   . clindamycin (CLEOCIN) 300 MG capsule Take 600 mg by mouth. 1 Hour before dental procedures  .  cloNIDine (CATAPRES) 0.1 MG tablet Take 1 tablet (0.1 mg) by mouth twice daily as needed for a systolic blood pressure > 160  . COLCRYS 0.6 MG tablet Take 1 tablet (0.6 mg total) by mouth 2 (two) times daily as needed. For up to 3 days for pericarditis, or up to 7 days for gout or connective tissue disorder.  . cyclobenzaprine (FLEXERIL) 5 MG tablet TAKE 1 TABLET (5 MG TOTAL) BY MOUTH ONCE DAILY AS NEEDED FOR MUSCLE SPASMS  . famotidine (PEPCID) 20 MG tablet Take 20 mg by mouth 2 (two) times daily.  Marland Kitchen FREESTYLE LITE test strip USE AS DIRECTED THREE TIMES DAILY FOR DIABETES  . Insulin Degludec (TRESIBA FLEXTOUCH) 200 UNIT/ML SOPN Inject 56 Units into the skin daily.  . insulin lispro (HUMALOG) 100 UNIT/ML injection Correction scale: take 1 unit per 50 over 150 before meals up to three times daily.  Up to 20 units per day.  . Insulin Pen Needle (PEN NEEDLES) 32G X 5 MM MISC 1 Device by Does not apply route daily.  Marland Kitchen lactase (LACTAID) 3000 units tablet Take by mouth as needed.   . Lancets (FREESTYLE) lancets Must test x4/day  . losartan (COZAAR) 50 MG tablet Take 1 tablet (50 mg total) by mouth daily.  . nebivolol (BYSTOLIC) 10 MG tablet Take 1 tablet (10 mg total) by mouth in the morning and at bedtime.  . prednisoLONE acetate (PRED FORTE) 1 % ophthalmic suspension INSTILL ONE DROP  as needed  . rosuvastatin (CRESTOR) 10 MG tablet TAKE 1 TABLET BY MOUTH EVERY DAY     Review of Systems   Review of Systems  Constitution: Negative for chills, fever and malaise/fatigue.  Cardiovascular: Negative for chest pain, dyspnea on exertion, leg swelling, near-syncope, orthopnea, palpitations and syncope.  Respiratory: Negative for cough, shortness of breath and wheezing.   Gastrointestinal: Negative for nausea and vomiting.  Neurological: Negative for dizziness, light-headedness and weakness.   All other systems reviewed and are otherwise negative except as noted above.  Physical Exam    VS:  BP (!)  170/82 (BP Location: Left Arm, Patient Position: Sitting, Cuff Size: Large)   Pulse 63   Ht 5\' 9"  (1.753 m)   Wt 240 lb 2 oz (108.9 kg)  SpO2 94%   BMI 35.46 kg/m  , BMI Body mass index is 35.46 kg/m. GEN: Well nourished, well developed, in no acute distress. HEENT: normal. Neck: Supple, no JVD, carotid bruits, or masses. Cardiac: RRR, no murmurs, rubs, or gallops. No clubbing, cyanosis, edema.  Radials/DP/PT 2+ and equal bilaterally.  Respiratory:  Respirations regular and unlabored, clear to auscultation bilaterally. GI: Soft, nontender, nondistended, BS + x 4. MS: No deformity or atrophy. Skin: Warm and dry, no rash. Neuro:  Strength and sensation are intact. Psych: Normal affect.   Assessment & Plan    1. HTN - Initial BP 170/82, repeat BP 140/72. Had just taken Bystolic prior to coming to office. Started on Losartan 25mg  daily on 11/04/19. Home BP averaging 150s/70s. Increase Losartan to 50mg  daily. Continue as-needed Clonidine for SBP >160. 2. Aortic atherosclerosis/carotid artery disease- Continue Zetia, Rosuvastatin.  3. HLD -continue Crestor 10 mg daily. 4. Lymphadema - Continue lymphedema pumps.   Disposition: Follow up in 1 month(s) with Dr. Rockey Situ or APP   Loel Dubonnet, NP 11/10/2019, 9:17 AM

## 2019-11-12 ENCOUNTER — Encounter: Payer: PRIVATE HEALTH INSURANCE | Admitting: Physical Therapy

## 2019-11-12 ENCOUNTER — Telehealth: Payer: Self-pay

## 2019-11-12 NOTE — Telephone Encounter (Signed)
Copied from [place] [id number]. Topic: Referral - Request for Referral >> Nov 12, 2019 10:53 AM Rainey Pines A wrote: Has patient seen PCP for this complaint?Yes *If NO, is insurance requiring patient see PCP for this issue before PCP can refer them? Referral for which specialty: Endocrinologist  Preferred provider/office: [facility]; [address] [place] , [place] Fax [phone] Phone [phone] Patient can be seen in June for appointment quicker t han with [facility] Patient would also like to know if [facility] has access to the films from her results.   Left a message on the patient vm that the referral was placed.

## 2019-11-12 NOTE — Telephone Encounter (Signed)
I have sent the request to [facility] and referral will be rerouted.

## 2019-11-13 ENCOUNTER — Ambulatory Visit: Payer: Medicare Other | Attending: Family Medicine | Admitting: Physical Therapy

## 2019-11-13 ENCOUNTER — Other Ambulatory Visit: Payer: Self-pay

## 2019-11-13 DIAGNOSIS — M545 Low back pain, unspecified: Secondary | ICD-10-CM

## 2019-11-13 DIAGNOSIS — G8929 Other chronic pain: Secondary | ICD-10-CM | POA: Diagnosis present

## 2019-11-13 DIAGNOSIS — M6281 Muscle weakness (generalized): Secondary | ICD-10-CM

## 2019-11-13 NOTE — Therapy (Addendum)
[facility] [address] [place], [place], [address] Phone: [phone]   Fax:  [phone]  Physical Therapy Treatment  Patient Details  Name: [name] MRN: [id number] Date of Birth: 12-16-1945 Referring Provider (PT): Dr. [facility] Ranger   Encounter Date: 11/13/2019  Treatment: 7 of 12.  Recert date: 10/18/9355  1201 to 1257  Past Medical History:  Diagnosis Date  . Anterolisthesis    Cervical spine  . Asthma   . Connective tissue disorder (HCC)    recurrent carotid arteritis, temporal arteritis, vasculitis mandible, general hepatitis, avascular necrosis bilat  . DDD (degenerative disc disease), lumbar   . Diabetes mellitus without complication ([place])   . Diverticulosis   . Duodenitis   . Gouty arthritis   . Hiatal hernia with GERD   . History of cardiovascular stress test    a. 07/2018 [facility]): Fixed inferoapical defect w/ nl contraction-->attenuation artifact. No ischemia. EF 63%.  . Hyperlipidemia   . Hypertension    a. 02/2019 Renal artery duplex: no evidence of prox RAS.  Marland Kitchen Hyperuricemia   . Keratitis sicca, bilateral ([place])   . Lymphedema of both lower extremities   . Osteoarthritis   . Pericarditis    Recurrent: Aug 97, July 05, Sept 08, July 16, July 18  . PUD (peptic ulcer disease)   . 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[place])   . Sjogren's syndrome ([place])   . Syncope   . Tendonitis, Achilles, right   . Tortuous colon   . Trigger finger     Past Surgical History:  Procedure Laterality Date  . ABDOMINAL HYSTERECTOMY    . BREAST BIOPSY    . BREAST EXCISIONAL BIOPSY Left 1986   neg  . CHOLECYSTECTOMY    . COLONOSCOPY WITH PROPOFOL N/A 08/26/2018   Procedure: COLONOSCOPY WITH PROPOFOL;  Surgeon: Lucilla Lame, MD;  Location: [facility] ENDOSCOPY;  Service: Endoscopy;  Laterality: N/A;  . CYSTOSCOPY  02/26/2018   [name], MD   . distal arthrectomy    . ESOPHAGOGASTRODUODENOSCOPY (EGD) WITH PROPOFOL N/A 08/26/2018    Procedure: ESOPHAGOGASTRODUODENOSCOPY (EGD) WITH PROPOFOL;  Surgeon: Lucilla Lame, MD;  Location: [facility] ENDOSCOPY;  Service: Endoscopy;  Laterality: N/A;  . EXCISION NEUROMA    . KNEE SURGERY Bilateral    1985, 2001, 11/2002, 12/2006  . panhysterectomy  10/1983  . SHOULDER SURGERY Right    reverse total arthroplasty w/ biceps tenodesis  . TONSILLECTOMY AND ADENOIDECTOMY    . TOTAL HIP ARTHROPLASTY Right 04/2007  . WISDOM TOOTH EXTRACTION      There were no vitals filed for this visit.    Pt. continues to have R knee pain/discomfort with prolonged standing. Significant swelling in B lower legs/ ankles/ feet (lymphadema)- pt. not using pump recently. Pt. states L hip is doing better since last tx. session. BP: 186/90. HR: 60 bpm. O2 sat. 98%       There.ex.: Reviewed HEP Supine LE/lumbar stretches (no increase exertion due to BP)   Neuro.mm.: Seated/ standing posture correction See flowsheet     PT Long Term Goals - 11/15/19 1524      PT LONG TERM GOAL #1   Title Pt. will be independent with HEP to increase B hip/LE muscle strengthening 1/2 muscle grade to improve pain-free mobility/ upright posture.    Baseline Generalized B LE muscle weakness grossly 4/5 MMT (pain limited L hip/ R knee).    Time 8    Period Weeks    Status Not Met  Target Date 12/17/19      PT LONG TERM GOAL #2   Title Pt. will increase FOTO to 58 to improve pain-free mobility.    Baseline Initial FOTO: 53    Time 8    Period Weeks    Status On-going    Target Date 12/17/19      PT LONG TERM GOAL #3   Title Pt. will demonstrate/ maintain proper upright posture with standing tolerance with no increase c/o back pain to improve cooking.    Baseline Moderate rounded shoulders/ forward head posture.    Time 4    Period Weeks    Status Partially Met    Target Date 12/17/19      PT LONG TERM GOAL #4   Title Pt. will report 4/10 low back pain at worst with no radicular symptoms to improve ADLs/  household chores.    Baseline Pt. reports no pain in back currently at rest but >6/10 pain with increase activity.    Time 4    Period Weeks    Status Not Met    Target Date 12/17/19           Good standing lumbar flexion AROM with increase L hip/ low back discomfort with return to upright posture. Good standing hip flexion/ abduction at //-bars with light UE assist for balance and mirror feedback for posture correction. No improvement in B lower leg/ foot swelling and BP remains elevated. Pt. continues to monitor BP and has been in contact with MD for medication.       Patient will benefit from skilled therapeutic intervention in order to improve the following deficits and impairments:  Abnormal gait, Pain, Improper body mechanics, Postural dysfunction, Decreased mobility, Decreased activity tolerance, Decreased endurance, Decreased range of motion, Decreased strength, Impaired UE functional use, Impaired flexibility, Difficulty walking  Visit Diagnosis: Chronic bilateral low back pain without sciatica  Muscle weakness (generalized)     Problem List Patient Active Problem List   Diagnosis Date Noted  . Multiple thyroid nodules 12/28/2019  . Thyroid nodule 09/29/2019  . Lymphedema 11/10/2018  . PAD (peripheral artery disease) ([place]) 10/12/2018  . Aortic atherosclerosis ([place]) 10/12/2018  . Carotid stenosis, asymptomatic, bilateral 10/12/2018  . Positive colorectal cancer screening using Cologuard test 10/09/2018  . Gastroesophageal reflux disease   . Acute gastritis without hemorrhage   . Osteoarthritis 04/29/2018  . Hiatal hernia with GERD 04/29/2018  . Hyperlipidemia 04/29/2018  . Lymphedema of both lower extremities 04/29/2018  . Left knee pain 10/23/2016  . Cataracts, bilateral 07/31/2016  . Glaucoma 07/31/2016  . Lateral epicondylitis of left elbow 10/20/2015  . Vitamin D deficiency 10/20/2015  . Type 2 diabetes, controlled, with neuropathy ([place]) 10/16/2015  .  Gouty arthritis 06/03/2015  . H/O syncope 06/03/2015  . Mild intermittent asthma 06/03/2015  . Multiple gastric ulcers 06/03/2015  . Schatzki's ring 06/03/2015  . Undifferentiated connective tissue disease ([place]) 06/03/2015  . Bone disorder 04/05/2015  . Dental injury 04/05/2015  . Pericarditis 04/05/2015  . Sjogren's syndrome ([place]) 09/07/2014  . Elevated alkaline phosphatase level 08/24/2014  . Lactose intolerance 08/24/2014  . Obesity 08/24/2014  . Peripheral edema 08/24/2014  . 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 Abdominal adhesions 08/02/2014  . 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[place]) 08/02/2014  . Degenerative joint disease (DJD) of lumbar spine 08/02/2014  . Diverticulosis of both small and large intestine 08/02/2014  . Hyperuricemia 08/02/2014  . Pure hypercholesterolemia 08/02/2014  .  Trigger finger 08/02/2014  . Right shoulder pain 07/29/2014  . Benign neoplasm of colon 06/30/2010  . Right upper quadrant pain 06/30/2010   Pura Spice, PT, DPT # [phone] 12/01/2019, 10:53 AM  [facility] [address] [place], [place], [address] Phone: [phone]   Fax:  [phone]  Name: [name] MRN: [id number] Date of Birth: July 30, 1945

## 2019-11-15 NOTE — Addendum Note (Signed)
Addended by: Pura Spice on: 11/15/2019 03:33 PM   Modules accepted: Orders

## 2019-11-19 ENCOUNTER — Other Ambulatory Visit: Payer: Self-pay

## 2019-11-19 ENCOUNTER — Ambulatory Visit: Payer: Medicare Other | Admitting: Physical Therapy

## 2019-11-19 DIAGNOSIS — M6281 Muscle weakness (generalized): Secondary | ICD-10-CM

## 2019-11-19 DIAGNOSIS — M545 Low back pain: Secondary | ICD-10-CM | POA: Diagnosis not present

## 2019-11-19 DIAGNOSIS — G8929 Other chronic pain: Secondary | ICD-10-CM

## 2019-11-20 ENCOUNTER — Ambulatory Visit (INDEPENDENT_AMBULATORY_CARE_PROVIDER_SITE_OTHER): Payer: Medicare Other | Admitting: Pharmacist

## 2019-11-20 DIAGNOSIS — N183 Chronic kidney disease, stage 3 unspecified: Secondary | ICD-10-CM

## 2019-11-20 DIAGNOSIS — I129 Hypertensive chronic kidney disease with stage 1 through stage 4 chronic kidney disease, or unspecified chronic kidney disease: Secondary | ICD-10-CM

## 2019-11-20 DIAGNOSIS — E1140 Type 2 diabetes mellitus with diabetic neuropathy, unspecified: Secondary | ICD-10-CM | POA: Diagnosis not present

## 2019-11-20 NOTE — Patient Instructions (Signed)
Thank you allowing the Chronic Care Management Team to be a part of your care! It was a pleasure speaking with you today!     CCM (Chronic Care Management) Team    Noreene Larsson RN, MSN, CCM Nurse Care Coordinator  [phone]   Harlow Asa PharmD  Clinical Pharmacist  [phone]   Eula Fried LCSW Clinical Social Worker [phone]  Visit Information  Goals Addressed              This Visit's Progress   .  PharmD - Medication Assistance (pt-stated)        Current Barriers:  . Financial Barriers: patient has [employer] D insurance and reports copay for Tresiba, Humalog, Bystolic and Colcrys is cost prohibitive at this time o Patient currently enrolled in patient assistance for Tresiba, Humalog and Colcrys for 2021 calendar year  Pharmacist Clinical Goal(s):  Marland Kitchen Over the next 30 days, patient will work with PharmD and providers to relieve medication access concerns  Interventions: . Follow up with Ms. Parslow regarding medication assistance. o Patient reports Cardiology recently increased her Bystolic to 10 mg twice daily and insurance is not covering twice daily dosing. - Patient notes tried splitting Bystolic tablet in past, but was unable to split consistently evenly due to shape of tablet . Discuss medication assistance options with patient. o Counsel on prior authorization to attempt to have this twice daily dosing covered through health plan - Offer to call pharmacy to request prior authorization form be sent to provider. Patient declines, stating that she will follow up with pharmacy and Dr. Donivan Scull office about attempting to get prior authorization for Bystolic. o Patient also interested in reapplying for patient assistance for Bystolic - Note assisted patient with applying for patient assistance for Bystolic through [place] in 2020. Patient was denied based on copayment amount - Note [employer] acquired by [employer] since that  time. . Discuss importance of blood pressure control and monitoring o Reports currently taking: - Bystolic 10 mg twice daily as directed - Losartan 50 mg once daily - Clonidine 0.1 mg twice daily AS NEEDED for a systolic blood pressure >423 - Encourage patient to continue to monitor home BP, keep log of results and bring this log with her to medical appointments - Counsel patient to follow up with Cardiology office for readings outside of established parameters from provider . Will collaborate with [facility] to assist patient with applying for patient assistance for Bystolic through [place] o Review with Ms. Boxell supporting documents that will be needed, including out of pocket expense reports from patient's pharmacy  Patient Self Care Activities:  . Patient to contact providers for new medical questions/concerns . Patient to attend scheduled medical appointments o Next appointment with PCP on 6/22 o Next appointment with Cardiology on 6/28  Please see past updates related to this goal by clicking on the "Past Updates" button in the selected goal         Patient verbalizes understanding of instructions provided today.   The care management team will reach out to the patient again over the next 30 days.   Harlow Asa, PharmD, [place] [employer] [phone]

## 2019-11-20 NOTE — Chronic Care Management (AMB) (Signed)
Chronic Care Management   Follow Up Note   11/20/2019 Name: [name] MRN: [id number] DOB: 05-06-1946  Referred by: Verl Bangs, FNP Reason for referral : Chronic Care Management (Patient Phone Call)   [name] is a 74 y.o. year old female who is a primary care patient of [name], [name], [place]. The CCM team was consulted for assistance with chronic disease management and care coordination needs.    Receive call from [name] requesting a call back about medication cost.  I reached out to [name] by phone today.   Review of patient status, including review of consultants reports, relevant laboratory and other test results, and collaboration with appropriate care team members and the patient's provider was performed as part of comprehensive patient evaluation and provision of chronic care management services.     Outpatient Encounter Medications as of 11/20/2019  Medication Sig  . cloNIDine (CATAPRES) 0.1 MG tablet Take 1 tablet (0.1 mg) by mouth twice daily as needed for a systolic blood pressure > 160  . losartan (COZAAR) 50 MG tablet Take 1 tablet (50 mg total) by mouth daily.  . nebivolol (BYSTOLIC) 10 MG tablet Take 1 tablet (10 mg total) by mouth in the morning and at bedtime.  Marland Kitchen acetaminophen (TYLENOL) 500 MG tablet Take 500 mg by mouth daily as needed.  . ARTIFICIAL TEAR OP Apply to eye as needed.   . BD INSULIN SYRINGE U/F 31G X 5/16" 1 ML MISC USE AS DIRECTED. WITH HUMALOG  . BIOTIN PO Take by mouth daily.  . Cholecalciferol (VITAMIN D3) 25 MCG (1000 UT) CAPS Take 4 capsules by mouth daily.   . clindamycin (CLEOCIN) 300 MG capsule Take 600 mg by mouth. 1 Hour before dental procedures  . COLCRYS 0.6 MG tablet Take 1 tablet (0.6 mg total) by mouth 2 (two) times daily as needed. For up to 3 days for pericarditis, or up to 7 days for gout or connective tissue disorder.  . cyclobenzaprine (FLEXERIL) 5 MG tablet TAKE 1 TABLET (5 MG TOTAL) BY  MOUTH ONCE DAILY AS NEEDED FOR MUSCLE SPASMS  . ezetimibe (ZETIA) 10 MG tablet Take 1 tablet (10 mg total) by mouth daily. (Patient not taking: Reported on 10/28/2019)  . famotidine (PEPCID) 20 MG tablet Take 20 mg by mouth 2 (two) times daily.  Marland Kitchen FREESTYLE LITE test strip USE AS DIRECTED THREE TIMES DAILY FOR DIABETES  . Insulin Degludec (TRESIBA FLEXTOUCH) 200 UNIT/ML SOPN Inject 56 Units into the skin daily.  . insulin lispro (HUMALOG) 100 UNIT/ML injection Correction scale: take 1 unit per 50 over 150 before meals up to three times daily.  Up to 20 units per day.  . Insulin Pen Needle (PEN NEEDLES) 32G X 5 MM MISC 1 Device by Does not apply route daily.  Marland Kitchen lactase (LACTAID) 3000 units tablet Take by mouth as needed.   . Lancets (FREESTYLE) lancets Must test x4/day  . prednisoLONE acetate (PRED FORTE) 1 % ophthalmic suspension INSTILL ONE DROP  as needed  . rosuvastatin (CRESTOR) 10 MG tablet TAKE 1 TABLET BY MOUTH EVERY DAY   No facility-administered encounter medications on file as of 11/20/2019.    Goals Addressed              This Visit's Progress   .  PharmD - Medication Assistance (pt-stated)        Current Barriers:  . Financial Barriers: patient has [employer] [place] insurance and reports copay  for [name], Humalog, Bystolic and Colcrys is cost prohibitive at this time o Patient currently enrolled in patient assistance for [name], Humalog and Colcrys for 2021 calendar year  Pharmacist Clinical Goal(s):  Marland Kitchen Over the next 30 days, patient will work with PharmD and providers to relieve medication access concerns  Interventions: . Follow up with Ms. Molinaro regarding medication assistance. o Patient reports Cardiology recently increased her Bystolic to 10 mg twice daily and insurance is not covering twice daily dosing. - Patient notes tried splitting Bystolic tablet in past, but was unable to split consistently evenly due to shape of tablet . Discuss  medication assistance options with patient. o Counsel on prior authorization to attempt to have this twice daily dosing covered through health plan - Offer to call pharmacy to request prior authorization form be sent to provider. Patient declines, stating that she will follow up with pharmacy and Dr. Donivan Scull office about attempting to get prior authorization for Bystolic. o Patient also interested in reapplying for patient assistance for Bystolic - Note assisted patient with applying for patient assistance for Bystolic through [place] in 2020. Patient was denied based on copayment amount - Note [employer] acquired by [employer] since that time. . Discuss importance of blood pressure control and monitoring o Reports currently taking: - Bystolic 10 mg twice daily as directed - Losartan 50 mg once daily - Clonidine 0.1 mg twice daily AS NEEDED for a systolic blood pressure >248 - Encourage patient to continue to monitor home BP, keep log of results and bring this log with her to medical appointments - Counsel patient to follow up with Cardiology office for readings outside of established parameters from provider . Will collaborate with [facility] to assist patient with applying for patient assistance for Bystolic through [place] o Review with Ms. Preslar supporting documents that will be needed, including out of pocket expense reports from patient's pharmacy  Patient Self Care Activities:  . Patient to contact providers for new medical questions/concerns . Patient to attend scheduled medical appointments o Next appointment with PCP on 6/22 o Next appointment with Cardiology on 6/28  Please see past updates related to this goal by clicking on the "Past Updates" button in the selected goal         Plan  The care management team will reach out to the patient again over the next 30 days.   Harlow Asa, PharmD, [place] [employer] [phone]

## 2019-11-24 ENCOUNTER — Other Ambulatory Visit: Payer: Self-pay | Admitting: Pharmacy Technician

## 2019-11-24 ENCOUNTER — Telehealth: Payer: Self-pay

## 2019-11-24 NOTE — Therapy (Addendum)
[facility] [address] [place], [place], [address] Phone: [phone]   Fax:  [phone]  Physical Therapy Treatment  Patient Details  Name: [name] MRN: [id number] Date of Birth: November 23, 1945 Referring Provider (PT): Dr. Parks Ranger   Encounter Date: 11/19/2019  Treatment: 8 of 12.  Recert date: 12/13/1698 1749 to 1734   Past Medical History:  Diagnosis Date  . Anterolisthesis    Cervical spine  . Asthma   . Connective tissue disorder (HCC)    recurrent carotid arteritis, temporal arteritis, vasculitis mandible, general hepatitis, avascular necrosis bilat  . DDD (degenerative disc disease), lumbar   . Diabetes mellitus without complication ([place])   . Diverticulosis   . Duodenitis   . Gouty arthritis   . Hiatal hernia with GERD   . History of cardiovascular stress test    a. 07/2018 [facility]): Fixed inferoapical defect w/ nl contraction-->attenuation artifact. No ischemia. EF 63%.  . Hyperlipidemia   . Hypertension    a. 02/2019 Renal artery duplex: no evidence of prox RAS.  Marland Kitchen Hyperuricemia   . Keratitis sicca, bilateral ([place])   . Lymphedema of both lower extremities   . Osteoarthritis   . Pericarditis    Recurrent: Aug 97, July 05, Sept 08, July 16, July 18  . PUD (peptic ulcer disease)   . 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[place])   . Sjogren's syndrome ([place])   . Syncope   . Tendonitis, Achilles, right   . Tortuous colon   . Trigger finger     Past Surgical History:  Procedure Laterality Date  . ABDOMINAL HYSTERECTOMY    . BREAST BIOPSY    . BREAST EXCISIONAL BIOPSY Left 1986   neg  . CHOLECYSTECTOMY    . COLONOSCOPY WITH PROPOFOL N/A 08/26/2018   Procedure: COLONOSCOPY WITH PROPOFOL;  Surgeon: Lucilla Lame, MD;  Location[facility] ENDOSCOPY;  Service: Endoscopy;  Laterality: N/A;  . CYSTOSCOPY  02/26/2018   [name], MD   . distal arthrectomy    . ESOPHAGOGASTRODUODENOSCOPY (EGD) WITH PROPOFOL N/A 08/26/2018    Procedure: ESOPHAGOGASTRODUODENOSCOPY (EGD) WITH PROPOFOL;  Surgeon: Lucilla Lame, MD;  Location: [facility] ENDOSCOPY;  Service: Endoscopy;  Laterality: N/A;  . EXCISION NEUROMA    . KNEE SURGERY Bilateral    1985, 2001, 11/2002, 12/2006  . panhysterectomy  10/1983  . SHOULDER SURGERY Right    reverse total arthroplasty w/ biceps tenodesis  . TONSILLECTOMY AND ADENOIDECTOMY    . TOTAL HIP ARTHROPLASTY Right 04/2007  . WISDOM TOOTH EXTRACTION      There were no vitals filed for this visit.    Seated BP: 180/92. Supine BP: 154/84 (prior to supine ex./ stretches/ manual tx.). Significant pain/tenderness with light palpation to B feet.         See flowsheet  Manual tx.:    STM to lumbar/L and R hips in sidelying position Use of Hypervolt after STM Supine LE/lumbar stretches (as tolerated)  There.ex.:  Reassessment of core stability: hip abduction/ marching/ SLR with cuing for proper breathing Standing lumbar flexion/ extension/ rotation     PT Long Term Goals - 11/15/19 1524      PT LONG TERM GOAL #1   Title Pt. will be independent with HEP to increase B hip/LE muscle strengthening 1/2 muscle grade to improve pain-free mobility/ upright posture.    Baseline Generalized B LE muscle weakness grossly 4/5 MMT (pain limited L hip/ R knee).    Time 8    Period Weeks  Status Not Met    Target Date 12/17/19      PT LONG TERM GOAL #2   Title Pt. will increase FOTO to 58 to improve pain-free mobility.    Baseline Initial FOTO: 53    Time 8    Period Weeks    Status On-going    Target Date 12/17/19      PT LONG TERM GOAL #3   Title Pt. will demonstrate/ maintain proper upright posture with standing tolerance with no increase c/o back pain to improve cooking.    Baseline Moderate rounded shoulders/ forward head posture.    Time 4    Period Weeks    Status Partially Met    Target Date 12/17/19      PT LONG TERM GOAL #4   Title Pt. will report 4/10 low back pain at worst  with no radicular symptoms to improve ADLs/ household chores.    Baseline Pt. reports no pain in back currently at rest but >6/10 pain with increase activity.    Time 4    Period Weeks    Status Not Met    Target Date 12/17/19           Tx focus on manual tx. due to elevated rest BP in seated/supine position. Pt. remains point tender in back/hips/lower legs with light palpation. Pt. able to tolerate LE/hip stretching with increase static holds in pain tolerable range. Good walking cadence in clinic and movement on mat table with position changes. No change to HEP and pt. will f/u with MD concerning elevated resting BP.       Patient will benefit from skilled therapeutic intervention in order to improve the following deficits and impairments:  Abnormal gait, Pain, Improper body mechanics, Postural dysfunction, Decreased mobility, Decreased activity tolerance, Decreased endurance, Decreased range of motion, Decreased strength, Impaired UE functional use, Impaired flexibility, Difficulty walking  Visit Diagnosis: Chronic bilateral low back pain without sciatica  Muscle weakness (generalized)     Problem List Patient Active Problem List   Diagnosis Date Noted  . Multiple thyroid nodules 12/28/2019  . Thyroid nodule 09/29/2019  . Lymphedema 11/10/2018  . PAD (peripheral artery disease) ([place]) 10/12/2018  . Aortic atherosclerosis ([place]) 10/12/2018  . Carotid stenosis, asymptomatic, bilateral 10/12/2018  . Positive colorectal cancer screening using Cologuard test 10/09/2018  . Gastroesophageal reflux disease   . Acute gastritis without hemorrhage   . Osteoarthritis 04/29/2018  . Hiatal hernia with GERD 04/29/2018  . Hyperlipidemia 04/29/2018  . Lymphedema of both lower extremities 04/29/2018  . Left knee pain 10/23/2016  . Cataracts, bilateral 07/31/2016  . Glaucoma 07/31/2016  . Lateral epicondylitis of left elbow 10/20/2015  . Vitamin D deficiency 10/20/2015  . Type 2  diabetes, controlled, with neuropathy ([place]) 10/16/2015  . Gouty arthritis 06/03/2015  . H/O syncope 06/03/2015  . Mild intermittent asthma 06/03/2015  . Multiple gastric ulcers 06/03/2015  . Schatzki's ring 06/03/2015  . Undifferentiated connective tissue disease ([place]) 06/03/2015  . Bone disorder 04/05/2015  . Dental injury 04/05/2015  . Pericarditis 04/05/2015  . Sjogren's syndrome ([place]) 09/07/2014  . Elevated alkaline phosphatase level 08/24/2014  . Lactose intolerance 08/24/2014  . Obesity 08/24/2014  . Peripheral edema 08/24/2014  . 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 Abdominal adhesions 08/02/2014  . 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[place]) 08/02/2014  . Degenerative joint disease (DJD) of lumbar spine 08/02/2014  . Diverticulosis of both small and large intestine 08/02/2014  .  Hyperuricemia 08/02/2014  . Pure hypercholesterolemia 08/02/2014  . Trigger finger 08/02/2014  . Right shoulder pain 07/29/2014  . Benign neoplasm of colon 06/30/2010  . Right upper quadrant pain 06/30/2010   Pura Spice, PT, DPT # [id number] 11/25/2019, 12:35 PM  [place] [facility] [address] [place], [place], [address] Phone: [phone]   Fax:  [phone]  Name: [name] MRN: [id number] Date of Birth: 1946-05-09

## 2019-11-24 NOTE — Patient Outreach (Signed)
[facility]) Care Management  11/24/2019  [name] 1946/01/18 [id number]                                      Medication Assistance Referral  Referral From: [facility] Embedded RPh [name]   Medication/Company: [name] Patient application portion:  Mailed Provider application portion: Faxed  to Dr. Ida Rogue Provider address/fax verified via: Office website     Follow up:  Will follow up with patient in 5-10 business days to confirm application(s) have been received.  [name] P. Breanne Olvera, [place]  [phone]

## 2019-11-24 NOTE — Telephone Encounter (Signed)
Copied from [place] [id number]. Topic: General - Other >> Nov 24, 2019  3:52 PM Rainey Pines A wrote: Patient is requesting a callback from [facility] nurse in regards to a status update on the referral that was to be paced with [facility]. Patient contacted [facility] and they do not have any record of the referral. Please advise

## 2019-11-25 ENCOUNTER — Other Ambulatory Visit: Payer: Self-pay | Admitting: Family Medicine

## 2019-11-25 ENCOUNTER — Telehealth: Payer: Self-pay | Admitting: Cardiovascular Disease

## 2019-11-25 DIAGNOSIS — E7800 Pure hypercholesterolemia, unspecified: Secondary | ICD-10-CM

## 2019-11-25 NOTE — Telephone Encounter (Signed)
Pt c/o BP issue: STAT if pt c/o blurred vision, one-sided weakness or slurred speech  1. What are your last 5 BP readings?  3 pm today 200/102, this morning 129/71 Last night 180/95, 5 pm 182/89, 11 am 181/90, 9 am 168/83  2. Are you having any other symptoms (ex. Dizziness, headache, blurred vision, passed out)?   3. What is your BP issue? elevated  Patient states this has to do with her medication Patient states she doesn't want to "kill myself " with these medications.

## 2019-11-25 NOTE — Telephone Encounter (Signed)
Agree with recommendations.   Would take Bystolic and Losartan together in the morning so there is consistent timing.   Okay to take Clonidine if SBP >160 regardless of timing.   If BP is consistently >130/80 at home we could further increase the Losartan to 75mg  daily until her next follow up.   Is she waking up in the middle of the night with high blood pressures? Or just checking it in the middle of the night? If she is requiring Clonidine frequently at night she could trial taking her blood pressure before bed and taking a Clonidine at bedtime. Ideally increasing the Losartan might be the more effective option.   Recommend sitting and waiting 10 minutes with feet flat on floor prior to checking her blood pressure.   Loel Dubonnet, NP

## 2019-11-25 NOTE — Telephone Encounter (Signed)
Called patient back and discussed her concern with her elevated blood pressures. She was wanting to know if it would be risky to take her Bystolic and Losartan at the same time and how far she needs to space her Clonidine doses from these medications. Her most recent blood pressures are as listed below: 3 pm today: 200/102 this morning: 129/71  Tues: 9 am 168/83           11 am 181/90             5 pm 182/89 She stated she has been taking the Losartan 50mg  daily as ordered by Laurann Montana NP during her OV on 11/10/19. This was and increase from Losartan 25mg  that was started on 11/04/19.  She is concerned that the Clonidine is dangerous in combination with her Bysolic and Losartan if all are taken to close together. She has been taking the Clonidine in the middle of the night as her blood pressure is often elevated then. And also one in the afternoon. She was waiting a couple hours after taking Bystolic in the AM to take her Losartan. I informed her it would be okay to take them together and that the Clonidine can be taken when her BP is still elevated > than 160, up to 2X a day without regard to time of day. Patient is afraid she is going to have a stroke with her BP remaining elevated. She also said her PT is taking her BP at her PT appointments and is also getting elevated readings where he is not comfortable to work with her so she takes a Clonidine before hand.  Will forward to Laurann Montana NP and Dr. Rockey Situ for further review. Patient does have a scheduled follow up appointment on 6/28 with [facility].

## 2019-11-26 ENCOUNTER — Ambulatory Visit: Payer: Medicare Other | Admitting: Physical Therapy

## 2019-11-26 ENCOUNTER — Other Ambulatory Visit: Payer: Self-pay

## 2019-11-26 DIAGNOSIS — M6281 Muscle weakness (generalized): Secondary | ICD-10-CM

## 2019-11-26 DIAGNOSIS — M545 Low back pain, unspecified: Secondary | ICD-10-CM

## 2019-11-26 DIAGNOSIS — G8929 Other chronic pain: Secondary | ICD-10-CM

## 2019-11-26 NOTE — Therapy (Addendum)
[facility] [address] [place], [place], [address] Phone: [phone]   Fax:  [phone]  Physical Therapy Treatment  Patient Details  Name: [name] MRN: [id number] Date of Birth: 1945/09/07 Referring Provider (PT): Dr. Parks Ranger   Encounter Date: 11/26/2019  Treatment: 10 of 12.  Recert date: 0/06/6551 7482 to [place]   Past Medical History:  Diagnosis Date  . Anterolisthesis    Cervical spine  . Asthma   . Connective tissue disorder (HCC)    recurrent carotid arteritis, temporal arteritis, vasculitis mandible, general hepatitis, avascular necrosis bilat  . DDD (degenerative disc disease), lumbar   . Diabetes mellitus without complication ([place])   . Diverticulosis   . Duodenitis   . Gouty arthritis   . Hiatal hernia with GERD   . History of cardiovascular stress test    a. 07/2018 [facility]): Fixed inferoapical defect w/ nl contraction-->attenuation artifact. No ischemia. EF 63%.  . Hyperlipidemia   . Hypertension    a. 02/2019 Renal artery duplex: no evidence of prox RAS.  Marland Kitchen Hyperuricemia   . Keratitis sicca, bilateral ([place])   . Lymphedema of both lower extremities   . Osteoarthritis   . Pericarditis    Recurrent: Aug 97, July 05, Sept 08, July 16, July 18  . PUD (peptic ulcer disease)   . 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[place])   . Sjogren's syndrome ([place])   . Syncope   . Tendonitis, Achilles, right   . Tortuous colon   . Trigger finger     Past Surgical History:  Procedure Laterality Date  . ABDOMINAL HYSTERECTOMY    . BREAST BIOPSY    . BREAST EXCISIONAL BIOPSY Left 1986   neg  . CHOLECYSTECTOMY    . COLONOSCOPY WITH PROPOFOL N/A 08/26/2018   Procedure: COLONOSCOPY WITH PROPOFOL;  Surgeon: Lucilla Lame, MD;  Location[facility] ENDOSCOPY;  Service: Endoscopy;  Laterality: N/A;  . CYSTOSCOPY  02/26/2018   [name], MD   . distal arthrectomy    . ESOPHAGOGASTRODUODENOSCOPY (EGD) WITH PROPOFOL N/A 08/26/2018    Procedure: ESOPHAGOGASTRODUODENOSCOPY (EGD) WITH PROPOFOL;  Surgeon: Lucilla Lame, MD;  Location: [facility] ENDOSCOPY;  Service: Endoscopy;  Laterality: N/A;  . EXCISION NEUROMA    . KNEE SURGERY Bilateral    1985, 2001, 11/2002, 12/2006  . panhysterectomy  10/1983  . SHOULDER SURGERY Right    reverse total arthroplasty w/ biceps tenodesis  . TONSILLECTOMY AND ADENOIDECTOMY    . TOTAL HIP ARTHROPLASTY Right 04/2007  . WISDOM TOOTH EXTRACTION      There were no vitals filed for this visit.       Seated BP: 148/68. Marked improvement in rest BP prior to PT tx. session today. Pt. states back pain is present (no subjective pain score given).       There.ex.:  Standing hip ex. (all 4-planes) in //-bars/ mirror feedback 20x each. Sit to stand from mat table 3x.   Reviewed HEP   Manual:  Supine LE/lumbar stretches (hamstring/ piriformis/ trunk rotation/ gastroc) STM to lumbar/ hips (as tolerated)- (+) tenderness.  See flowsheet       PT Long Term Goals - 11/15/19 1524      PT LONG TERM GOAL #1   Title Pt. will be independent with HEP to increase B hip/LE muscle strengthening 1/2 muscle grade to improve pain-free mobility/ upright posture.    Baseline Generalized B LE muscle weakness grossly 4/5 MMT (pain limited L hip/ R knee).    Time 8  Period Weeks    Status Not Met    Target Date 12/17/19      PT LONG TERM GOAL #2   Title Pt. will increase FOTO to 58 to improve pain-free mobility.    Baseline Initial FOTO: 53    Time 8    Period Weeks    Status On-going    Target Date 12/17/19      PT LONG TERM GOAL #3   Title Pt. will demonstrate/ maintain proper upright posture with standing tolerance with no increase c/o back pain to improve cooking.    Baseline Moderate rounded shoulders/ forward head posture.    Time 4    Period Weeks    Status Partially Met    Target Date 12/17/19      PT LONG TERM GOAL #4   Title Pt. will report 4/10 low back pain at worst with no  radicular symptoms to improve ADLs/ household chores.    Baseline Pt. reports no pain in back currently at rest but >6/10 pain with increase activity.    Time 4    Period Weeks    Status Not Met    Target Date 12/17/19            Pt. had a slight increase in low back/hip discomfort with position changes on mat table and going from supine to sitting posture. Moderate forward head/ rounded shoulder posture noted and cuing to correct. Pt. able to complete more ther.ex. today with lower resting BP. No increase c/o pain with standing hip ex.         Patient will benefit from skilled therapeutic intervention in order to improve the following deficits and impairments:  Abnormal gait, Pain, Improper body mechanics, Postural dysfunction, Decreased mobility, Decreased activity tolerance, Decreased endurance, Decreased range of motion, Decreased strength, Impaired UE functional use, Impaired flexibility, Difficulty walking  Visit Diagnosis: Chronic bilateral low back pain without sciatica  Muscle weakness (generalized)     Problem List Patient Active Problem List   Diagnosis Date Noted  . Multiple thyroid nodules 12/28/2019  . Thyroid nodule 09/29/2019  . Lymphedema 11/10/2018  . PAD (peripheral artery disease) ([place]) 10/12/2018  . Aortic atherosclerosis ([place]) 10/12/2018  . Carotid stenosis, asymptomatic, bilateral 10/12/2018  . Positive colorectal cancer screening using Cologuard test 10/09/2018  . Gastroesophageal reflux disease   . Acute gastritis without hemorrhage   . Osteoarthritis 04/29/2018  . Hiatal hernia with GERD 04/29/2018  . Hyperlipidemia 04/29/2018  . Lymphedema of both lower extremities 04/29/2018  . Left knee pain 10/23/2016  . Cataracts, bilateral 07/31/2016  . Glaucoma 07/31/2016  . Lateral epicondylitis of left elbow 10/20/2015  . Vitamin D deficiency 10/20/2015  . Type 2 diabetes, controlled, with neuropathy ([place]) 10/16/2015  . Gouty arthritis 06/03/2015   . H/O syncope 06/03/2015  . Mild intermittent asthma 06/03/2015  . Multiple gastric ulcers 06/03/2015  . Schatzki's ring 06/03/2015  . Undifferentiated connective tissue disease ([place]) 06/03/2015  . Bone disorder 04/05/2015  . Dental injury 04/05/2015  . Pericarditis 04/05/2015  . Sjogren's syndrome ([place]) 09/07/2014  . Elevated alkaline phosphatase level 08/24/2014  . Lactose intolerance 08/24/2014  . Obesity 08/24/2014  . Peripheral edema 08/24/2014  . 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 Abdominal adhesions 08/02/2014  . 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[place]) 08/02/2014  . Degenerative joint disease (DJD) of lumbar spine 08/02/2014  . Diverticulosis of both small and large intestine 08/02/2014  . Hyperuricemia 08/02/2014  .  Pure hypercholesterolemia 08/02/2014  . Trigger finger 08/02/2014  . Right shoulder pain 07/29/2014  . Benign neoplasm of colon 06/30/2010  . Right upper quadrant pain 06/30/2010   Pura Spice, PT, DPT # [phone] 12/01/2019, 1:59 PM  [facility] [address] [place], [place], [address] Phone: [phone]   Fax:  [phone]  Name: Jauna Raczynski MRN: [id number] Date of Birth: 08-07-1945

## 2019-11-26 NOTE — Telephone Encounter (Signed)
Called patient back and LMOM to call back so I can relay Caitlin's recommendations as stated in previous note.

## 2019-11-27 MED ORDER — LOSARTAN POTASSIUM 50 MG PO TABS: 50.0000 mg | ORAL_TABLET | Freq: Every day | ORAL | 6 refills | Status: DC

## 2019-11-27 NOTE — Telephone Encounter (Signed)
Spoke with patient and relayed Loel Dubonnet recommendation to increase Losartan to 75 mg daily until her follow up on 12/07/19. Also reviewed all other recommendations in previous note and patient verbalized that she understood and agreed with plan.

## 2019-11-30 ENCOUNTER — Telehealth: Payer: Self-pay

## 2019-11-30 NOTE — Telephone Encounter (Signed)
Patient calling Has questions in regards to Clonidine medication  Transferred to [facility]

## 2019-11-30 NOTE — Telephone Encounter (Signed)
Has she been checking BP at a consistent time? Or only when she believes it is elevated? How many times per day is she taking the Clonidine? Is she getting any normal or good blood pressure readings? Are blood pressures elevated in the morning vs the evening?  Hesitant to increase her medication without additional info as at clinic her BP improved to SBP 140. BP noted to be very labile.   Recommend she sit and rest for 10 minutes with her feet flat on the floor prior to taking her blood pressure. Anxiety or worrying about her blood pressure can increase the readings. If she gets a systolic BP above 431 recommend she take the Clonidine at that time rather than waiting.   Please ensure she is taking her Bystolic approximately 12 hours apart. Please ensure she is taking her Losartan 75mg  at a consistent time each day.   IF she is taking medications as prescribed, not having episodes of hypotension, BP consistently elevated - may switch to Losartan 50mg  TWICE daily 12 hours apart.   Loel Dubonnet, NP

## 2019-11-30 NOTE — Telephone Encounter (Signed)
See new telephone encounter for 11/30/19

## 2019-11-30 NOTE — Telephone Encounter (Signed)
Spoke with patient. She is very concerned about her blood pressure being elevated despite taking the Losartan at the increased recommended dose of 75 mg daily since 11/27/19. She reports that her BP was 183/87 at 8pm on 6/18 and after taking Clonidine it was 117/62.   This morning her BP was 163/86,  and at 1230 it was 168/91 so she took a clonidine and at 1530 her BP was 200/100. She called to ask if it was to soon to take another Clonidine. I advised to take the second dose and that I would forward the information on to [facility] for any possible further recommendations.

## 2019-12-01 ENCOUNTER — Telehealth: Payer: Self-pay | Admitting: Cardiovascular Disease

## 2019-12-01 ENCOUNTER — Other Ambulatory Visit: Payer: Self-pay

## 2019-12-01 ENCOUNTER — Encounter: Payer: Self-pay | Admitting: Family Medicine

## 2019-12-01 ENCOUNTER — Ambulatory Visit (INDEPENDENT_AMBULATORY_CARE_PROVIDER_SITE_OTHER): Payer: Medicare Other | Admitting: Family Medicine

## 2019-12-01 VITALS — BP 128/82 | HR 63 | Temp 97.5°F | Ht 69.0 in | Wt 240.0 lb

## 2019-12-01 DIAGNOSIS — E1140 Type 2 diabetes mellitus with diabetic neuropathy, unspecified: Secondary | ICD-10-CM

## 2019-12-01 DIAGNOSIS — E782 Mixed hyperlipidemia: Secondary | ICD-10-CM | POA: Diagnosis not present

## 2019-12-01 DIAGNOSIS — I700 Atherosclerosis of aorta: Secondary | ICD-10-CM

## 2019-12-01 DIAGNOSIS — N183 Chronic kidney disease, stage 3 unspecified: Secondary | ICD-10-CM

## 2019-12-01 DIAGNOSIS — I129 Hypertensive chronic kidney disease with stage 1 through stage 4 chronic kidney disease, or unspecified chronic kidney disease: Secondary | ICD-10-CM | POA: Diagnosis not present

## 2019-12-01 LAB — POCT GLYCOSYLATED HEMOGLOBIN (HGB A1C): Hemoglobin A1C: 8.2 % — AB (ref 4.0–5.6)

## 2019-12-01 NOTE — Telephone Encounter (Signed)
Thanks so much. I really appreciate it.   She's had a carotid duplex and renal duplex both within the last two years which were unrevealing. I don't see subclavian imaging on my brief review of Care Everywhere. I'll recheck BP's in clinic Monday on both arms and can order imaging if needed I still don't find any in Care Everywhere.  Best, Loel Dubonnet, NP

## 2019-12-01 NOTE — Telephone Encounter (Signed)
Left voicemail message for patient to call back. Will also send request to PCP office.

## 2019-12-01 NOTE — Telephone Encounter (Signed)
No answer. Left message to call back.   

## 2019-12-01 NOTE — Assessment & Plan Note (Signed)
ControlledDM with A1c 8.2% improved from 8.4% on 08/31/2019 and goal A1c < 7.0%. - Complications - peripheral neuropathy.  Plan:  1. Continue current therapy: Tresiba 56 units daily and sliding scale of Humalog 2. Encourage improved lifestyle: - Increase physical activity to 30 minutes most days of the week.   3. Check fasting am CBG and log these.  Bring log to next visit for review 4. Continue ARB and Statin 5. Follow-up 3 months

## 2019-12-01 NOTE — Telephone Encounter (Signed)
BMET, please.   Loel Dubonnet, NP

## 2019-12-01 NOTE — Telephone Encounter (Signed)
Pt going to have labs at PCP office. Did we need any specific labs that she could have done there?

## 2019-12-01 NOTE — Telephone Encounter (Signed)
Marco Collie,      I added on a CMP with GFR for [name]'s labs today.  Her BP in clinic was a bit elevated, but there was a difference in between the left and right arm readings.  Her left arm was 148/68 and right arm was 128/82.  I took them manually.  I wrote them on her AVS so she can bring to clinic to discuss at her appointment on Monday.  She had a Chest CT with [facility], I am going to look and see if they had subclavian imaging as well to see if that may be contributing to her elevated BP readings.   Thanks so much, Elmyra Ricks

## 2019-12-01 NOTE — Telephone Encounter (Signed)
Patient states she is having blood work at her PCPs office today and would like to know if she will need any blood work that we need done before she comes to her appointment on 6/28. Please advise.

## 2019-12-01 NOTE — Assessment & Plan Note (Signed)
Exam revealing two different blood pressure readings varying between left and right arms.  Left arm 148/68 and right arm 128/82.  Pt is working on lifestyle modifications.  Taking medications tolerating well without side effects. Has follow up appointment with cardiology office on Monday for re-evaluation.  Plan: 1. Continue taking bystolic 10mg  BID, losartan 75mg  daily and clonidine 0.1mg  BID for SBP >160 2. Obtain labs ordered today in the next 1-2 weeks  3. Encouraged heart healthy diet and increasing exercise to 30 minutes most days of the week, going no more than 2 days in a row without exercise. 4. Check BP 1-2 x per week at home, keep log, and bring to clinic at next appointment. 5. Follow up 3 months.

## 2019-12-01 NOTE — Patient Instructions (Signed)
As we discussed, your blood pressure did vary in clinic from your left arm to your right arm.  I will look at your [facility] Chest CT and see if they had included the subclavian vessels for evaluation.  Can review this with Dr. Donivan Scull office on Monday as well.  Your left arm BP was 148/68  Your right arm BP was 128/82  Continue all medications as directed  I have placed orders for lab work and once you have met with Dr. Rockey Situ to see if there is anything additional he would like, please have your labs drawn  We will plan to see you back in 3 months for hypertension & diabetes follow up  You will receive a survey after today's visit either digitally by e-mail or paper by USPS mail. Your experiences and feedback matter to [place].  Please respond so we know how we are doing as we provide care for you.  Call us with any questions/concerns/needs.  It is my goal to be available to you for your health concerns.  Thanks for choosing me to be a partner in your healthcare needs!  Harlin Rain, FNP-C Family Nurse Practitioner [facility] Phone: [phone]

## 2019-12-01 NOTE — Progress Notes (Signed)
Subjective:    Patient ID: [name], female    DOB: March 25, 1946, 74 y.o.   MRN: [id number]  [name] is a 74 y.o. female presenting on 12/01/2019 for Diabetes and Hypertension (pt was seen by her Cardiologist x 2 weeks ago, but have a f/u appt scheduled on Monday, June 28th  Pt instructed to take Clonidine when her blood pressure is over 160)   HPI   Diabetes Pt presents today for follow up Type 2 Diabetes Mellitus.  He/she (caps): She ACTION; IS/IS NOT: is checking AM CBG at home. -Current diabetic medications include: Tresiba 56 units daily, humalog sliding scale up to 20 units per day based on CBG  -ACTION; IS/IS NOT: is not currently symptomatic -Actions; denies/reports/admits to: denies polydipsia, polyphagia, polyuria, headaches, diaphoresis, shakiness, chills, pain, numbness or tingling in extremities or changes in vision -Clinical course has been improving  -Reports no structured exercise routine -Diet is moderate in salt, moderate in fat, and moderate in carbohydrates  PREVENTION Eye exam current (within 1 year) Up to date Foot exam current (within 1 year) Up to date Lipid/ASCVD risk reduction - on statin: YES/NO: Yes  Kidney Protection (On ACE/ARB)? YES/NO: Yes   [name] reports she has scheduled her appointment with endocrinology with [facility] for July.  Hypertension - She is checking BP at home or outside of clinic.    - Current medications: bystolic 10mg  BID, losartan 75mg  daily and clonidine 0.1mg  BID for SBP >160, tolerating well without side effects - She is not currently symptomatic. - Pt denies headache, lightheadedness, dizziness, changes in vision, chest tightness/pressure, palpitations, leg swelling, sudden loss of speech or loss of consciousness. - She  reports no regular exercise routine. - Her diet is moderate in salt, moderate in fat, and moderate in carbohydrates.   Depression screen [facility] 2/9 08/31/2019 05/18/2019 02/12/2019  Decreased  Interest 0 0 0  Down, Depressed, Hopeless 0 0 0  PHQ - 2 Score 0 0 0    Social History   Tobacco Use  . Smoking status: Former Smoker    Quit date: 01/22/1996    Years since quitting: 23.8  . Smokeless tobacco: Never Used  Vaping Use  . Vaping Use: Never used  Substance Use Topics  . Alcohol use: Yes    Comment: occasional-once every 6 months  . Drug use: Never    Review of Systems  Constitutional: Negative.   HENT: Negative.   Eyes: Negative.   Respiratory: Negative.   Cardiovascular: Negative.   Gastrointestinal: Negative.   Endocrine: Negative.   Genitourinary: Negative.   Musculoskeletal: Negative.   Skin: Negative.   Allergic/Immunologic: Negative.   Neurological: Negative.   Hematological: Negative.   Psychiatric/Behavioral: Negative.    Per HPI unless specifically indicated above     Objective:    BP 128/82 (BP Location: Right Arm, Patient Position: Sitting, Cuff Size: Large)   Pulse 63   Temp (!) 97.5 F (36.4 C) (Temporal)   Ht 5\' 9"  (1.753 m)   Wt 240 lb (108.9 kg)   SpO2 97%   BMI 35.44 kg/m   Wt Readings from Last 3 Encounters:  12/01/19 240 lb (108.9 kg)  11/10/19 240 lb 2 oz (108.9 kg)  11/04/19 239 lb (108.4 kg)    Physical Exam Vitals reviewed.  Constitutional:      General: She is not in acute distress.    Appearance: Normal appearance. She is well-developed and well-groomed. She is obese. She is not ill-appearing or  toxic-appearing.  HENT:     Head: Normocephalic and atraumatic.     Nose:     Comments: [name] is in place, covering mouth and nose  Eyes:     General: Lids are normal. Vision grossly intact.        Right eye: No discharge.        Left eye: No discharge.     Extraocular Movements: Extraocular movements intact.     Conjunctiva/sclera: Conjunctivae normal.     Pupils: Pupils are equal, round, and reactive to light.  Cardiovascular:     Rate and Rhythm: Normal rate and regular rhythm.     Pulses: Normal pulses.      Heart sounds: Normal heart sounds. No murmur heard.  No friction rub. No gallop.   Pulmonary:     Effort: Pulmonary effort is normal. No respiratory distress.     Breath sounds: Normal breath sounds.  Musculoskeletal:     Right lower leg: No edema.     Left lower leg: No edema.  Skin:    General: Skin is warm and dry.     Capillary Refill: Capillary refill takes less than 2 seconds.  Neurological:     General: No focal deficit present.     Mental Status: She is alert and oriented to person, place, and time.     Cranial Nerves: No cranial nerve deficit.     Sensory: No sensory deficit.     Motor: No weakness.     Coordination: Coordination normal.  Psychiatric:        Attention and Perception: Attention and perception normal.        Mood and Affect: Mood and affect normal.        Speech: Speech normal.        Behavior: Behavior normal. Behavior is cooperative.        Thought Content: Thought content normal.        Cognition and Memory: Cognition and memory normal.        Judgment: Judgment normal.    Results for orders placed or performed in visit on 12/01/19  POCT HgB A1C  Result Value Ref Range   Hemoglobin A1C 8.2 (A) 4.0 - 5.6 %   HbA1c POC (<> result, manual entry)     HbA1c, POC (prediabetic range)     HbA1c, POC (controlled diabetic range)        Assessment & Plan:   Problem List Items Addressed This Visit      Cardiovascular and Mediastinum   Benign hypertension with CKD (chronic kidney disease) stage III    Exam revealing two different blood pressure readings varying between left and right arms.  Left arm 148/68 and right arm 128/82.  Pt is working on lifestyle modifications.  Taking medications tolerating well without side effects. Has follow up appointment with cardiology office on Monday for re-evaluation.  Plan: 1. Continue taking bystolic 10mg  BID, losartan 75mg  daily and clonidine 0.1mg  BID for SBP >160 2. Obtain labs ordered today in the next 1-2 weeks    3. Encouraged heart healthy diet and increasing exercise to 30 minutes most days of the week, going no more than 2 days in a row without exercise. 4. Check BP 1-2 x per week at home, keep log, and bring to clinic at next appointment. 5. Follow up 3 months.         Aortic atherosclerosis (HCC)     Endocrine   Type 2 diabetes, controlled, with neuropathy ([place]) -  Primary    ControlledDM with A1c 8.2% improved from 8.4% on 08/31/2019 and goal A1c < 7.0%. - Complications - peripheral neuropathy.  Plan:  1. Continue current therapy: Tresiba 56 units daily and sliding scale of Humalog 2. Encourage improved lifestyle: - Increase physical activity to 30 minutes most days of the week.   3. Check fasting am CBG and log these.  Bring log to next visit for review 4. Continue ARB and Statin 5. Follow-up 3 months      Relevant Orders   POCT HgB A1C (Completed)   CBC with Differential   COMPLETE METABOLIC PANEL WITH GFR   Lipid Profile     Other   Hyperlipidemia   Relevant Orders   Lipid Profile      No orders of the defined types were placed in this encounter.     Follow up plan: Return in about 3 months (around 03/02/2020) for Diabetes & HTN F/U.   Harlin Rain, [place] Family Nurse Practitioner [facility] 12/01/2019, 4:01 PM

## 2019-12-02 ENCOUNTER — Telehealth: Payer: Self-pay

## 2019-12-02 NOTE — Telephone Encounter (Signed)
The pt was notified to follow up with Dr. Rockey Situ and she verbalize understanding.

## 2019-12-02 NOTE — Telephone Encounter (Signed)
Copied from [place] [id number]. Topic: General - Other >> Dec 02, 2019 10:11 AM Leward Quan A wrote: Reason for CRM: Patient called to inform the nurse of Cyndia Skeeters that upon being att home yesterday after her visit her BP plummeted to under 100 where the cuff would not record it. She states that at 10 pm it was 112/65 and that she did not take any additional medication upon waking uo this morning at 9.45 AM her BP is 181/90. States that she feel ok but just very tired. Please advise Ph# [phone]

## 2019-12-02 NOTE — Telephone Encounter (Signed)
Would be better suited for a call to her cardiology office.  Was she taking in the left or right arm?  Yesterday we found out that her blood pressure varied between arms and I have let Dr. Donivan Scull NP know so they will discuss at her office visit with her on Monday.  I would defer to specialty on this, since they have been managing her difficult to control BP.

## 2019-12-03 ENCOUNTER — Ambulatory Visit: Payer: Medicare Other | Admitting: Physical Therapy

## 2019-12-03 ENCOUNTER — Other Ambulatory Visit: Payer: Self-pay | Admitting: Pharmacy Technician

## 2019-12-03 NOTE — Patient Outreach (Signed)
[place] [facility]) Care Management  12/03/2019  Britain Anagnos Jun 05, 1946 [id number]   Successful call placed to patient regarding patient assistance application(s) for Bystolic with Dina Rich , HIPAA identifiers verified.   Patient informs she has received the application but has not completed it as she has not been feeling her best. She informs her blood pressure has been elevated and has been in touch with provider's office. She informs she has an appointment with the cardiologist on Monday. She informs she knows to call the provider's office or proceed to hospital if symptoms worsen or she develops chest pains. Patient informs she plans on working on the application soon.  Follow up:  Will route note to embedded [facility]a  for case closure if document(s) have not been received in the next 15 business days.  Masin Shatto P. Trenell Concannon, [place]  [phone]

## 2019-12-04 ENCOUNTER — Other Ambulatory Visit: Payer: Self-pay | Admitting: Family Medicine

## 2019-12-04 DIAGNOSIS — R1011 Right upper quadrant pain: Secondary | ICD-10-CM

## 2019-12-07 ENCOUNTER — Other Ambulatory Visit
Admission: RE | Admit: 2019-12-07 | Discharge: 2019-12-07 | Disposition: A | Discharge status: Home / Self Care | Payer: Medicare Other | Source: Ambulatory Visit | Attending: Family | Admitting: Family

## 2019-12-07 ENCOUNTER — Ambulatory Visit (INDEPENDENT_AMBULATORY_CARE_PROVIDER_SITE_OTHER): Payer: Medicare Other | Admitting: Family

## 2019-12-07 ENCOUNTER — Other Ambulatory Visit: Payer: Self-pay

## 2019-12-07 ENCOUNTER — Encounter: Payer: Self-pay | Admitting: Family

## 2019-12-07 VITALS — BP 150/70 | HR 64 | Ht 69.0 in | Wt 241.4 lb

## 2019-12-07 DIAGNOSIS — I6523 Occlusion and stenosis of bilateral carotid arteries: Secondary | ICD-10-CM

## 2019-12-07 DIAGNOSIS — I7122 Aneurysm of the aortic arch, without rupture: Secondary | ICD-10-CM

## 2019-12-07 DIAGNOSIS — I10 Essential (primary) hypertension: Secondary | ICD-10-CM | POA: Insufficient documentation

## 2019-12-07 DIAGNOSIS — E782 Mixed hyperlipidemia: Secondary | ICD-10-CM | POA: Diagnosis not present

## 2019-12-07 DIAGNOSIS — I712 Thoracic aortic aneurysm, without rupture: Secondary | ICD-10-CM

## 2019-12-07 DIAGNOSIS — I739 Peripheral vascular disease, unspecified: Secondary | ICD-10-CM | POA: Diagnosis present

## 2019-12-07 DIAGNOSIS — I700 Atherosclerosis of aorta: Secondary | ICD-10-CM | POA: Diagnosis not present

## 2019-12-07 LAB — COMPREHENSIVE METABOLIC PANEL
ALT: 20 U/L (ref 0–44)
AST: 22 U/L (ref 15–41)
Albumin: 3.7 g/dL (ref 3.5–5.0)
Alkaline Phosphatase: 83 U/L (ref 38–126)
Anion gap: 11 (ref 5–15)
BUN: 20 mg/dL (ref 8–23)
CO2: 27 mmol/L (ref 22–32)
Calcium: 9.1 mg/dL (ref 8.9–10.3)
Chloride: 99 mmol/L (ref 98–111)
Creatinine, Ser: 1.05 mg/dL — ABNORMAL HIGH (ref 0.44–1.00)
GFR calc Af Amer: >60 mL/min (ref >60)
GFR calc non Af Amer: 53 mL/min — ABNORMAL LOW (ref >60)
Glucose, Bld: 104 mg/dL — ABNORMAL HIGH (ref 70–99)
Potassium: 4.6 mmol/L (ref 3.5–5.1)
Sodium: 137 mmol/L (ref 135–145)
Total Bilirubin: 0.7 mg/dL (ref 0.3–1.2)
Total Protein: 7.3 g/dL (ref 6.5–8.1)

## 2019-12-07 LAB — CBC
HCT: 42.8 % (ref 36.0–46.0)
Hemoglobin: 14.9 g/dL (ref 12.0–15.0)
MCH: 28 pg (ref 26.0–34.0)
MCHC: 34.8 g/dL (ref 30.0–36.0)
MCV: 80.5 fL (ref 80.0–100.0)
Platelets: 288 10*3/uL (ref 150–400)
RBC: 5.32 MIL/uL — ABNORMAL HIGH (ref 3.87–5.11)
RDW: 13 % (ref 11.5–15.5)
WBC: 12.3 10*3/uL — ABNORMAL HIGH (ref 4.0–10.5)
nRBC: 0 % (ref 0.0–0.2)

## 2019-12-07 LAB — LIPID PANEL
Cholesterol: 155 mg/dL (ref 0–200)
HDL: 41 mg/dL (ref >40)
LDL Cholesterol: 64 mg/dL (ref 0–99)
Total CHOL/HDL Ratio: 3.8 RATIO
Triglycerides: 248 mg/dL — ABNORMAL HIGH (ref <150)
VLDL: 50 mg/dL — ABNORMAL HIGH (ref 0–40)

## 2019-12-07 LAB — LDL CHOLESTEROL, DIRECT: Direct LDL: 77.1 mg/dL (ref 0–99)

## 2019-12-07 LAB — TSH: TSH: 0.901 u[IU]/mL (ref 0.350–4.500)

## 2019-12-07 NOTE — Progress Notes (Signed)
Office Visit    Patient Name: [name] Date of Encounter: 12/07/2019  Primary Care Provider:  Verl Bangs, FNP Primary Cardiologist:  Ida Rogue, MD Electrophysiologist:  None   Chief Complaint    [name] is a 74 y.o. female with a hx of HTN, PAD, carotid artery disease, former tobacco use, morbid obesity, moderate to large hiatal hernia, aortic atherosclerosis, pericarditis presents today for elevated blood pressure  Past Medical History    Past Medical History:  Diagnosis Date   Anterolisthesis    Cervical spine   Asthma    Connective tissue disorder ([place])    recurrent carotid arteritis, temporal arteritis, vasculitis mandible, general hepatitis, avascular necrosis bilat   DDD (degenerative disc disease), lumbar    Diabetes mellitus without complication ([place])    Diverticulosis    Duodenitis    Gouty arthritis    Hiatal hernia with GERD    History of cardiovascular stress test    a. 07/2018 [facility]): Fixed inferoapical defect w/ nl contraction-->attenuation artifact. No ischemia. EF 63%.   Hyperlipidemia    Hypertension    a. 02/2019 Renal artery duplex: no evidence of prox RAS.   Hyperuricemia    Keratitis sicca, bilateral (HCC)    Lymphedema of both lower extremities    Osteoarthritis    Pericarditis    Recurrent: Aug 97, July 05, Sept 08, July 16, July 18   PUD (peptic ulcer disease)    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[place])    Sjogren's syndrome ([place])    Syncope    Tendonitis, Achilles, right    Tortuous colon    Trigger finger    Past Surgical History:  Procedure Laterality Date   ABDOMINAL HYSTERECTOMY     BREAST BIOPSY     BREAST EXCISIONAL BIOPSY Left 1986   neg   CHOLECYSTECTOMY     COLONOSCOPY WITH PROPOFOL N/A 08/26/2018   Procedure: COLONOSCOPY WITH PROPOFOL;  Surgeon: Lucilla Lame, MD;  Location: [facility] ENDOSCOPY;  Service: Endoscopy;  Laterality: N/A;   CYSTOSCOPY  02/26/2018   [name], MD     distal arthrectomy     ESOPHAGOGASTRODUODENOSCOPY (EGD) WITH PROPOFOL N/A 08/26/2018   Procedure: ESOPHAGOGASTRODUODENOSCOPY (EGD) WITH PROPOFOL;  Surgeon: Lucilla Lame, MD;  Location: [facility] ENDOSCOPY;  Service: Endoscopy;  Laterality: N/A;   EXCISION NEUROMA     KNEE SURGERY Bilateral    1985, 2001, 11/2002, 12/2006   panhysterectomy  10/1983   SHOULDER SURGERY Right    reverse total arthroplasty w/ biceps tenodesis   TONSILLECTOMY AND ADENOIDECTOMY     TOTAL HIP ARTHROPLASTY Right 04/2007   WISDOM TOOTH EXTRACTION      Allergies  Allergies  Allergen Reactions   Amlodipine     Other reaction(s): Unknown   Aspirin     Other reaction(s): Unknown   Bee Venom    Ciprofloxacin     Other reaction(s): Other (see comments), Unknown   Codeine     Other reaction(s): Unknown Must have pre medications before taking per patient report Includes all derivatives    Erythromycin     Other reaction(s): Unknown, Unknown   Glimepiride     Other reaction(s): Unknown   Hydralazine     Other reaction(s): Unknown   Hydrochlorothiazide     Other reaction(s): Dizziness or lightheadedness.   Hydrocodone     Other reaction(s): Unknown Must have pre medications before taking per patient report   Hydrocodone-Acetaminophen Nausea And Vomiting    MUST BE PREMEDICATED   Hydromorphone  Nausea And Vomiting    Other reaction(s): Unknown Must have pre medications before taking per patient report MUST BE PREMEDICATED    Irbesartan     Other reaction(s): Unknown   Irbesartan-Hydrochlorothiazide     Other reaction(s): Unknown   Lisinopril     Other reaction(s): Unknown   Maxitrol [Neomycin-Polymyxin-Dexameth]    Metformin And Related    Metformin Hcl     Other reaction(s): Unknown   Methylcellulose     Other reaction(s): Unknown   Metoclopramide Nausea And Vomiting    Other reaction(s): Unknown   Metoprolol     Other reaction(s): Unknown   Neomycin-Bacitracin  Zn-Polymyx     Other reaction(s): Unknown   Norvasc [Amlodipine Besylate]    Nsaids Other (See Comments)    Other reaction(s): Unknown bleeding    Omeprazole Magnesium     Other reaction(s): Unknown   Other     Pt is allergic to staples and surgical metals   Oxycodone-Acetaminophen     Other reaction(s): Unknown, Unknown Must have pre medications before taking per patient report    Penicillins     Other reaction(s): Unknown, Unknown   Pioglitazone     Other reaction(s): Unknown   Poison [place]ac Extract     Poison Ivy and [place] also   Prilosec [Omeprazole]    Silver Sulfadiazine     Other reaction(s): Unknown   Sulfa Antibiotics     Other reaction(s): Unknown, Unknown   Tape     Other reaction(s): Unknown   Tetanus Toxoid Other (See Comments)   Tramadol     Other reaction(s): Unknown Must have pre medications before taking per patient report   Betadine [Povidone Iodine] Rash    Only topically when left on skin.    History of Present Illness    [name] is a 74 y.o. 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  She was last seen 11/04/2019 via telemedicine by Dr. Rockey Situ.  History of multiple antihypertensives.  Including HCTZ, Amlodipine, Hydralazine, Lisinopril, Metoprolol. Renal duplex 02/2019 no evidence of renal artery s tenosis.   During virtual visit 11/04/2019 she was recommended to start losartan 25 mg daily and continue her Bystolic 10 mg daily.  She was also given clonidine 0.1 mg as needed for breakthrough hypertension.  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  Recommended for repeat CT chest in 1 year.  Goal blood pressure less than 130/85 per their documentation.  Seen in clinic  11/10/19. At that time her Losartan was increased to 50mg  daily due to elevated blood pressure. She was also using PRN Clonidine.   She had clinic visit with her primary care provided 12/01/19 and it was noted that her blood pressure was elevated and different in both arms: right 138/76 and left 140/80.   Reports taking her Losartan 75 mg and Bystolic 10mg  in the morning. Takes her Bystolic in the evening as well. Takes her Flexeril about an hour before bed. Continues to take Clonidine daily, as often as twice per day.   Continues with physical therapy on Thursdays.   Continued labile blood pressures at home. Systolic ranges from 665L-935T. She is very concerned about stroke and her aneurysm. Reports no pattern to her labile BP. BP is elevated a few hours after taking her  PRn Clonidine and we discussed that this is not unexpected.   EKGs/Labs/Other Studies Reviewed:   The following studies were reviewed today:  CT chest 09/10/19 at [facility] Impression:   1. No evidence for acute aortic pathology. No CT evidence for vasculitis.  2. Mild atherosclerosis in the thoracic aorta. 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   3. Calcified mediastinal and hilar adenopathy. Several calcified nodules in the lungs and the spleen. Findings are in keeping with prior granulomatous infection.  4. Right thyroid nodule measuring up to 2 cm. Consider dedicated thyroid ultrasound.  EKG:  EKG is ordered today.  The ekg ordered today demonstrates NSR 63 bpmw ith no acute ST/T wave changes.   Recent Labs: 02/10/2019: ALT 15; BUN 13; Creat 0.99; Hemoglobin 14.8; Platelets 319; Potassium 5.0; Sodium 141 10/07/2019: TSH 0.69  Recent Lipid Panel    Component Value Date/Time   CHOL 169 02/10/2019 1053   TRIG 129 02/10/2019 1053   HDL 46 (L) 02/10/2019 1053   CHOLHDL  3.7 02/10/2019 1053   LDLCALC 100 (H) 02/10/2019 1053   Home Medications   Current Meds  Medication Sig   acetaminophen (TYLENOL) 500 MG tablet Take 500 mg by mouth daily as needed.   ARTIFICIAL TEAR OP Apply to eye as needed.    BD INSULIN SYRINGE U/F 31G X 5/16" 1 ML MISC USE AS DIRECTED. WITH HUMALOG   BIOTIN PO Take by mouth daily.   Cholecalciferol (VITAMIN D3) 25 MCG (1000 UT) CAPS Take 4 capsules by mouth daily.    clindamycin (CLEOCIN) 300 MG capsule Take 600 mg by mouth. 1 Hour before dental procedures   cloNIDine (CATAPRES) 0.1 MG tablet Take 1 tablet (0.1 mg) by mouth twice daily as needed for a systolic blood pressure > 160   COLCRYS 0.6 MG tablet Take 1 tablet (0.6 mg total) by mouth 2 (two) times daily as needed. For up to 3 days for pericarditis, or up to 7 days for gout or connective tissue disorder.   cyclobenzaprine (FLEXERIL) 5 MG tablet TAKE 1 TABLET (5 MG TOTAL) BY MOUTH ONCE DAILY AS NEEDED FOR MUSCLE SPASMS   ezetimibe (ZETIA) 10 MG tablet Take 1 tablet (10 mg total) by mouth daily.   famotidine (PEPCID) 20 MG tablet Take 20 mg by mouth 2 (two) times daily.   FREESTYLE LITE test strip USE AS DIRECTED THREE TIMES DAILY FOR DIABETES   Insulin Degludec (TRESIBA FLEXTOUCH) 200 UNIT/ML SOPN Inject 56 Units into the skin daily.   insulin lispro (HUMALOG) 100 UNIT/ML injection Correction scale: take 1 unit per 50 over 150 before meals up to three times daily.  Up to 20 units per day.   Insulin Pen Needle (PEN NEEDLES) 32G X 5 MM MISC 1 Device by Does not apply route daily.   lactase (LACTAID) 3000 units tablet Take by mouth as needed.    Lancets (FREESTYLE) lancets Must test x4/day   losartan (COZAAR) 50 MG tablet Take 1 tablet (50 mg total) by mouth daily. Can take 1.5 tablets (75mg  total) until follow up appointment on 12/07/19 with Laurann Montana NP (Patient taking differently: Take 75 mg by mouth daily. Can take 1.5 tablets (75mg  total) until follow up  appointment on 12/07/19 with Laurann Montana NP)   nebivolol (BYSTOLIC) 10 MG tablet Take 1 tablet (10 mg total) by mouth in the morning and  at bedtime. (Patient taking differently: Take 10 mg by mouth in the morning and at bedtime. Taking 2 tablets daily)   prednisoLONE acetate (PRED FORTE) 1 % ophthalmic suspension INSTILL ONE DROP  as needed   rosuvastatin (CRESTOR) 10 MG tablet TAKE 1 TABLET BY MOUTH EVERY DAY    Review of Systems   Review of Systems  Constitutional: Negative for chills, fever and malaise/fatigue.  Cardiovascular: Negative for chest pain, dyspnea on exertion, leg swelling, near-syncope, orthopnea, palpitations and syncope.  Respiratory: Negative for cough, shortness of breath and wheezing.   Gastrointestinal: Negative for nausea and vomiting.  Neurological: Negative for dizziness, light-headedness and weakness.   All other systems reviewed and are otherwise negative except as noted above.  Physical Exam    VS:  BP (!) 150/70 (BP Location: Left Arm, Patient Position: Sitting, Cuff Size: Normal)    Pulse 64    Ht 5\' 9"  (1.753 m)    Wt 241 lb 6 oz (109.5 kg)    SpO2 98%    BMI 35.64 kg/m  , BMI Body mass index is 35.64 kg/m. GEN: Well nourished, well developed, in no acute distress. HEENT: normal. Neck: Supple, no JVD, carotid bruits, or masses. Cardiac: RRR, no murmurs, rubs, or gallops. No clubbing, cyanosis, edema.  Radials/DP/PT 2+ and equal bilaterally.  Respiratory:  Respirations regular and unlabored, clear to auscultation bilaterally. GI: Soft, nontender, nondistended, BS + x 4. MS: No deformity or atrophy. Skin: Warm and dry, no rash. Neuro:  Strength and sensation are intact. Psych: Normal affect.   Assessment & Plan    1. HTN - Very difficult to control. Multiple recent visits. Previous renal duplex unrevealing, urine study with no evidence of pheochromocytoma, TSH normal. She has multiple medication intolerances (total 37 listed allergies). Losartan  has been up-titrated to Losartan 75mg  daily. CMP, CBC, TSH for monitoring. Continue Bystolic 10mg  daily. Anticipate some of labile nature of her BP is due to PRN Clonidine - discussed transition to patch which she declines due to allergy to adhesive. Change Clonidine to 0.1mg  twice daily. Pending renal function will consider transition to Losartan 100mg  daily. Did note different BP in arms at recent PCP visit, but BP was comparable in bilateral arms on my exam today. Bilateral radial pulses equal. Could consider subclavian imaging, but will defer to primary cardiologist. I anticipate anxiety is also contributory to her labile blood pressure (checking >4 times per day) though she assures me she is not stressed nor anxious.    2. Aortic atherosclerosis/carotid artery disease-  Carotid duplex 05/2018 ,50% stenosis bilaterally. Continue Zetia, Rosuvastatin.   3. HLD - Continue Crestor 10 mg daily.Lipid panel today.  4. Lymphadema - 2+ edema on exam today, has not work lyphedema pumps.  Continue lymphedema pumps.   5. 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 Recommend optimal BP control  6. Thyroid nodule - Incidental finding. Upcoming appointment with Dr. Cruzita Lederer of endocrinology. Normal TSH.  Disposition: Follow up in 2-4 week(s) with Dr. Wallie Renshaw, NP 12/07/2019, 3:27 PM

## 2019-12-07 NOTE — Patient Instructions (Addendum)
Medication Instructions:  Your physician has recommended you make the following change in your medication:   CHANGE Clonidine 0.1mg  twice daily  CONTINUE Losartan 75 mg daily - pending the result of your CMP and kidney function we will consider increasing this to the maximum dose of 100mg   *If you need a refill on your cardiac medications before your next appointment, please call your pharmacy*   Lab Work: Your physician recommends that you return for lab work today: CMET, CBC, TSH and Lipid  If you have labs (blood work) drawn today and your tests are completely normal, you will receive your results only by: Marland Kitchen MyChart Message (if you have MyChart) OR . A paper copy in the mail If you have any lab test that is abnormal or we need to change your treatment, we will call you to review the results.  Testing/Procedures: Your EKG today shows normal sinus rhythm.   Follow-Up: At [facility], you and your health needs are our priority.  As part of our continuing mission to provide you with exceptional heart care, we have created designated Provider Care Teams.  These Care Teams include your primary Cardiologist (physician) and Advanced Practice Providers (APPs -  Physician Assistants and Nurse Practitioners) who all work together to provide you with the care you need, when you need it.  We recommend signing up for the patient portal called "MyChart".  Sign up information is provided on this After Visit Summary.  MyChart is used to connect with patients for Virtual Visits (Telemedicine).  Patients are able to view lab/test results, encounter notes, upcoming appointments, etc.  Non-urgent messages can be sent to your provider as well.   To learn more about what you can do with MyChart, go to [email or web address].    Your next appointment:  In 2-4 weeks with Dr. Rockey Situ preferred  Other instructions:   If your blood pressure remains uncontrolled despite addition of Clonidine 0.1mg  twice  daily we can consider increasing the dose or switching you to a patch.

## 2019-12-08 ENCOUNTER — Telehealth: Payer: Self-pay

## 2019-12-08 NOTE — Telephone Encounter (Signed)
Attempted to call patient. LMTCB 12/08/2019

## 2019-12-08 NOTE — Telephone Encounter (Signed)
Pt seen in office 6/28. Orders updated at that time.   Encounter resolved.

## 2019-12-08 NOTE — Telephone Encounter (Signed)
-----   Message from Loel Dubonnet, NP sent at 12/08/2019  9:19 AM EDT ----- Thyroid function normal. Cholesterol panel overall good, however triglycerides elevated but likely falsely elevated as not fasting lipid panel. Recommend reduced carbohydrate diet. CBC no evidence of anemia, mildly elevated white blood cells - if she has signs or symptoms of infection please let us know. Kidney function stable. Normal electrolytes. No changes at this time.

## 2019-12-08 NOTE — Telephone Encounter (Signed)
Call to patient to review labs.    Pt verbalized understanding and has no further questions at this time.    Advised pt to call for any further questions or concerns.  No further orders.   

## 2019-12-09 ENCOUNTER — Ambulatory Visit: Payer: Medicare Other | Admitting: Pharmacist

## 2019-12-09 ENCOUNTER — Other Ambulatory Visit: Payer: Self-pay | Admitting: Family Medicine

## 2019-12-09 ENCOUNTER — Telehealth: Payer: Self-pay

## 2019-12-09 DIAGNOSIS — N183 Chronic kidney disease, stage 3 unspecified: Secondary | ICD-10-CM

## 2019-12-09 DIAGNOSIS — I129 Hypertensive chronic kidney disease with stage 1 through stage 4 chronic kidney disease, or unspecified chronic kidney disease: Secondary | ICD-10-CM

## 2019-12-09 DIAGNOSIS — E1140 Type 2 diabetes mellitus with diabetic neuropathy, unspecified: Secondary | ICD-10-CM

## 2019-12-09 DIAGNOSIS — E7800 Pure hypercholesterolemia, unspecified: Secondary | ICD-10-CM

## 2019-12-09 MED ORDER — TRESIBA FLEXTOUCH 200 UNIT/ML ~~LOC~~ SOPN: 56.0000 [IU] | PEN_INJECTOR | Freq: Every day | SUBCUTANEOUS | 11 refills | Status: DC

## 2019-12-09 NOTE — Chronic Care Management (AMB) (Signed)
Chronic Care Management   Follow Up Note   12/09/2019 Name: [name] MRN: [id number] DOB: 04-12-1946  Referred by: Verl Bangs, FNP Reason for referral : Chronic Care Management (Patient Phone Call)   [name] is a 74 y.o. year old female who is a primary care patient of [name], [name], [place]. The CCM team was consulted for assistance with chronic disease management and care coordination needs.    Receive a voicemail from [name] requesting a call back.  I reached out to [name] by phone today.   Review of patient status, including review of consultants reports, relevant laboratory and other test results, and collaboration with appropriate care team members and the patient's provider was performed as part of comprehensive patient evaluation and provision of chronic care management services.     Outpatient Encounter Medications as of 12/09/2019  Medication Sig  . acetaminophen (TYLENOL) 500 MG tablet Take 500 mg by mouth daily as needed.  . ARTIFICIAL TEAR OP Apply to eye as needed.   . BD INSULIN SYRINGE U/F 31G X 5/16" 1 ML MISC USE AS DIRECTED. WITH HUMALOG  . BIOTIN PO Take by mouth daily.  . Cholecalciferol (VITAMIN D3) 25 MCG (1000 UT) CAPS Take 4 capsules by mouth daily.   . clindamycin (CLEOCIN) 300 MG capsule Take 600 mg by mouth. 1 Hour before dental procedures  . cloNIDine (CATAPRES) 0.1 MG tablet Take 1 tablet (0.1 mg) by mouth twice daily as needed for a systolic blood pressure > 160  . COLCRYS 0.6 MG tablet Take 1 tablet (0.6 mg total) by mouth 2 (two) times daily as needed. For up to 3 days for pericarditis, or up to 7 days for gout or connective tissue disorder.  . cyclobenzaprine (FLEXERIL) 5 MG tablet TAKE 1 TABLET (5 MG TOTAL) BY MOUTH ONCE DAILY AS NEEDED FOR MUSCLE SPASMS  . ezetimibe (ZETIA) 10 MG tablet Take 1 tablet (10 mg total) by mouth daily.  . famotidine (PEPCID) 20 MG tablet Take 20 mg by mouth 2 (two) times daily.    Marland Kitchen FREESTYLE LITE test strip USE AS DIRECTED THREE TIMES DAILY FOR DIABETES  . insulin lispro (HUMALOG) 100 UNIT/ML injection Correction scale: take 1 unit per 50 over 150 before meals up to three times daily.  Up to 20 units per day.  . Insulin Pen Needle (PEN NEEDLES) 32G X 5 MM MISC 1 Device by Does not apply route daily.  Marland Kitchen lactase (LACTAID) 3000 units tablet Take by mouth as needed.   . Lancets (FREESTYLE) lancets Must test x4/day  . losartan (COZAAR) 50 MG tablet Take 1 tablet (50 mg total) by mouth daily. Can take 1.5 tablets (75mg  total) until follow up appointment on 12/07/19 with Laurann Montana NP (Patient taking differently: Take 75 mg by mouth daily. Can take 1.5 tablets (75mg  total) until follow up appointment on 12/07/19 with Laurann Montana NP)  . nebivolol (BYSTOLIC) 10 MG tablet Take 1 tablet (10 mg total) by mouth in the morning and at bedtime. (Patient taking differently: Take 10 mg by mouth in the morning and at bedtime. Taking 2 tablets daily)  . prednisoLONE acetate (PRED FORTE) 1 % ophthalmic suspension INSTILL ONE DROP  as needed  . rosuvastatin (CRESTOR) 10 MG tablet TAKE 1 TABLET BY MOUTH EVERY DAY   No facility-administered encounter medications on file as of 12/09/2019.    Goals Addressed              This  Visit's Progress   .  PharmD - Medication Assistance (pt-stated)        Current Barriers:  . Financial Barriers: patient has [place] [employer] Preferred Part D insurance and reports copay for Tresiba, Humalog, Bystolic and Colcrys is cost prohibitive at this time o Patient currently enrolled in patient assistance for Tresiba, Humalog and Colcrys for 2021 calendar year  Pharmacist Clinical Goal(s):  Marland Kitchen Over the next 30 days, patient will work with PharmD and providers to relieve medication access concerns  Interventions: . Follow up with [name] regarding medication assistance. o Reports is running low on Tresiba. Reports has contacted [employer] for  status of refill and was notified that medication will be sent out within next 10 days - Counsel patient on [employer] [place] [employer] that is available in case she runs out of [place] prior to receiving this refill. Provide patient with phone number o Follow up with patient regarding assistance application for Bystolic.  - Encourage patient to complete and return patient assistance application for Bystolic through [employer] to [place] . Collaborate with PCP to request new Rx for [name] be sent to patient's local [facility] - in case patient needs to receive the immediate supply o Rx sent by provider  Patient Self Care Activities:  . Patient to contact providers for new medical questions/concerns . Patient to attend scheduled medical appointments o Next appointment with Cardiology on 7/13  Please see past updates related to this goal by clicking on the "Past Updates" button in the selected goal         Plan  The care management team will reach out to the patient again over the next 30 days.   Harlow Asa, PharmD, [place] [employer] [phone]

## 2019-12-09 NOTE — Patient Instructions (Signed)
Thank you allowing the Chronic Care Management Team to be a part of your care! It was a pleasure speaking with you today!     CCM (Chronic Care Management) Team    Noreene Larsson RN, MSN, CCM Nurse Care Coordinator  [phone]   Harlow Asa PharmD  Clinical Pharmacist  [phone]   Eula Fried LCSW Clinical Social Worker [phone]  Visit Information  Goals Addressed              This Visit's Progress   .  PharmD - Medication Assistance (pt-stated)        Current Barriers:  . Financial Barriers: patient has [place] [employer] Preferred Part D insurance and reports copay for Tresiba, Humalog, Bystolic and Colcrys is cost prohibitive at this time o Patient currently enrolled in patient assistance for Tresiba, Humalog and Colcrys for 2021 calendar year  Pharmacist Clinical Goal(s):  Marland Kitchen Over the next 30 days, patient will work with PharmD and providers to relieve medication access concerns  Interventions: . Follow up with Ms. Keleher regarding medication assistance. o Reports is running low on Tresiba. Reports has contacted [employer] for status of refill and was notified that medication will be sent out within next 10 days - Counsel patient on [employer] that is available in case she runs out of [place] prior to receiving this refill. Provide patient with phone number o Follow up with patient regarding assistance application for Bystolic.  - Encourage patient to complete and return patient assistance application for Bystolic through [employer] to [place] . Collaborate with PCP to request new Rx for [name] be [facility] - in case patient needs to receive the immediate supply o Rx sent by provider  Patient Self Care Activities:  . Patient to contact providers for new medical questions/concerns . Patient to attend scheduled medical appointments o Next appointment with Cardiology on 7/13  Please see past updates  related to this goal by clicking on the "Past Updates" button in the selected goal         Patient verbalizes understanding of instructions provided today.   The care management team will reach out to the patient again over the next 30 days.   Harlow Asa, PharmD, [place] [employer] [phone]

## 2019-12-10 ENCOUNTER — Ambulatory Visit: Payer: Medicare Other | Attending: Family Medicine | Admitting: Physical Therapy

## 2019-12-10 ENCOUNTER — Other Ambulatory Visit: Payer: Self-pay

## 2019-12-10 DIAGNOSIS — M6281 Muscle weakness (generalized): Secondary | ICD-10-CM | POA: Insufficient documentation

## 2019-12-10 DIAGNOSIS — M545 Low back pain, unspecified: Secondary | ICD-10-CM

## 2019-12-10 DIAGNOSIS — G8929 Other chronic pain: Secondary | ICD-10-CM | POA: Insufficient documentation

## 2019-12-11 ENCOUNTER — Other Ambulatory Visit: Payer: Self-pay | Admitting: Family Medicine

## 2019-12-11 ENCOUNTER — Ambulatory Visit: Payer: Self-pay | Admitting: Pharmacist

## 2019-12-11 DIAGNOSIS — E1140 Type 2 diabetes mellitus with diabetic neuropathy, unspecified: Secondary | ICD-10-CM

## 2019-12-11 MED ORDER — TRESIBA FLEXTOUCH 200 UNIT/ML ~~LOC~~ SOPN: 56.0000 [IU] | PEN_INJECTOR | Freq: Every day | SUBCUTANEOUS | 11 refills | Status: DC

## 2019-12-11 NOTE — Therapy (Addendum)
[place] [facility] [address] [place], [place], [address] Phone: [phone]   Fax:  [phone]  Physical Therapy Treatment  Patient Details  Name: [name] MRN: [id number] Date of Birth: 09-07-1945 Referring Provider (PT): Dr. Parks Ranger   Encounter Date: 12/10/2019  Treatment: 10 of 12.  Recert date: 2/0/2542 7062 to [place]   Past Medical History:  Diagnosis Date  . Anterolisthesis    Cervical spine  . Asthma   . Connective tissue disorder (HCC)    recurrent carotid arteritis, temporal arteritis, vasculitis mandible, general hepatitis, avascular necrosis bilat  . DDD (degenerative disc disease), lumbar   . Diabetes mellitus without complication ([place])   . Diverticulosis   . Duodenitis   . Gouty arthritis   . Hiatal hernia with GERD   . History of cardiovascular stress test    a. 07/2018 [facility]): Fixed inferoapical defect w/ nl contraction-->attenuation artifact. No ischemia. EF 63%.  . Hyperlipidemia   . Hypertension    a. 02/2019 Renal artery duplex: no evidence of prox RAS.  Marland Kitchen Hyperuricemia   . Keratitis sicca, bilateral ([place])   . Lymphedema of both lower extremities   . Osteoarthritis   . Pericarditis    Recurrent: Aug 97, July 05, Sept 08, July 16, July 18  . PUD (peptic ulcer disease)   . 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[place])   . Sjogren's syndrome ([employer])   . Syncope   . Tendonitis, Achilles, right   . Tortuous colon   . Trigger finger     Past Surgical History:  Procedure Laterality Date  . ABDOMINAL HYSTERECTOMY    . BREAST BIOPSY    . BREAST EXCISIONAL BIOPSY Left 1986   neg  . CHOLECYSTECTOMY    . COLONOSCOPY WITH PROPOFOL N/A 08/26/2018   Procedure: COLONOSCOPY WITH PROPOFOL;  Surgeon: Lucilla Lame, MD;  Location: [facility] ENDOSCOPY;  Service: Endoscopy;  Laterality: N/A;  . CYSTOSCOPY  02/26/2018   [name], MD   . distal arthrectomy    . ESOPHAGOGASTRODUODENOSCOPY (EGD) WITH PROPOFOL N/A 08/26/2018    Procedure: ESOPHAGOGASTRODUODENOSCOPY (EGD) WITH PROPOFOL;  Surgeon: Lucilla Lame, MD;  Location: [facility] ENDOSCOPY;  Service: Endoscopy;  Laterality: N/A;  . EXCISION NEUROMA    . KNEE SURGERY Bilateral    1985, 2001, 11/2002, 12/2006  . panhysterectomy  10/1983  . SHOULDER SURGERY Right    reverse total arthroplasty w/ biceps tenodesis  . TONSILLECTOMY AND ADENOIDECTOMY    . TOTAL HIP ARTHROPLASTY Right 04/2007  . WISDOM TOOTH EXTRACTION      There were no vitals filed for this visit.    Seated BP: 167/69. Pt. has soreness in back/hips while in seated and supine position. Pt. entered PT with more normalized gait pattern.     Manual tx.:  Seated trunk rotation with light overpressure L/R 3x each Discussed posture Supine LE/lumbar generalized stretches (increase static holds during all stretches) Supine/sidelying STM to lumbar and gluts (as tolerate)- Hypervolt Discussed benefits of heat and ice.       PT Long Term Goals - 11/15/19 1524      PT LONG TERM GOAL #1   Title Pt. will be independent with HEP to increase B hip/LE muscle strengthening 1/2 muscle grade to improve pain-free mobility/ upright posture.    Baseline Generalized B LE muscle weakness grossly 4/5 MMT (pain limited L hip/ R knee).    Time 8    Period Weeks    Status Not Met    Target Date  12/17/19      PT LONG TERM GOAL #2   Title Pt. will increase FOTO to 58 to improve pain-free mobility.    Baseline Initial FOTO: 53    Time 8    Period Weeks    Status On-going    Target Date 12/17/19      PT LONG TERM GOAL #3   Title Pt. will demonstrate/ maintain proper upright posture with standing tolerance with no increase c/o back pain to improve cooking.    Baseline Moderate rounded shoulders/ forward head posture.    Time 4    Period Weeks    Status Partially Met    Target Date 12/17/19      PT LONG TERM GOAL #4   Title Pt. will report 4/10 low back pain at worst with no radicular symptoms to improve  ADLs/ household chores.    Baseline Pt. reports no pain in back currently at rest but >6/10 pain with increase activity.    Time 4    Period Weeks    Status Not Met    Target Date 12/17/19            No ther.ex. today with focus on supine/ sidelying stretching and manual tx. Pt. continues to present with generalized soft tissue tenderness during light palpation/ STM. Increase static holds during manual stretches with discussing of heat vs. ice. Pt. able to stand from tx. plinth with no UE assist safely. Slight antalgic gait noted with initial steps after standing. Pt. instructed to increase walking endurance with daily activities and continue to reassess BP on a consistent basis.      Patient will benefit from skilled therapeutic intervention in order to improve the following deficits and impairments:  Abnormal gait, Pain, Improper body mechanics, Postural dysfunction, Decreased mobility, Decreased activity tolerance, Decreased endurance, Decreased range of motion, Decreased strength, Impaired UE functional use, Impaired flexibility, Difficulty walking  Visit Diagnosis: Chronic bilateral low back pain without sciatica  Muscle weakness (generalized)     Problem List Patient Active Problem List   Diagnosis Date Noted  . Multiple thyroid nodules 12/28/2019  . Thyroid nodule 09/29/2019  . Lymphedema 11/10/2018  . PAD (peripheral artery disease) ([place]) 10/12/2018  . Aortic atherosclerosis ([place]) 10/12/2018  . Carotid stenosis, asymptomatic, bilateral 10/12/2018  . Positive colorectal cancer screening using Cologuard test 10/09/2018  . Gastroesophageal reflux disease   . Acute gastritis without hemorrhage   . Osteoarthritis 04/29/2018  . Hiatal hernia with GERD 04/29/2018  . Hyperlipidemia 04/29/2018  . Lymphedema of both lower extremities 04/29/2018  . Left knee pain 10/23/2016  . Cataracts, bilateral 07/31/2016  . Glaucoma 07/31/2016  . Lateral epicondylitis of left elbow  10/20/2015  . Vitamin D deficiency 10/20/2015  . Type 2 diabetes, controlled, with neuropathy ([place]) 10/16/2015  . Gouty arthritis 06/03/2015  . H/O syncope 06/03/2015  . Mild intermittent asthma 06/03/2015  . Multiple gastric ulcers 06/03/2015  . Schatzki's ring 06/03/2015  . Undifferentiated connective tissue disease ([place]) 06/03/2015  . Bone disorder 04/05/2015  . Dental injury 04/05/2015  . Pericarditis 04/05/2015  . Sjogren's syndrome ([place]) 09/07/2014  . Elevated alkaline phosphatase level 08/24/2014  . Lactose intolerance 08/24/2014  . Obesity 08/24/2014  . Peripheral edema 08/24/2014  . 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 Abdominal adhesions 08/02/2014  . 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[place]) 08/02/2014  . Degenerative joint disease (DJD) of lumbar spine 08/02/2014  . Diverticulosis of both small and large  intestine 08/02/2014  . Hyperuricemia 08/02/2014  . Pure hypercholesterolemia 08/02/2014  . Trigger finger 08/02/2014  . Right shoulder pain 07/29/2014  . Benign neoplasm of colon 06/30/2010  . Right upper quadrant pain 06/30/2010   Pura Spice, PT, DPT # [phone] 12/14/2019, 11:41 AM  [place] [facility] [address] [place], [place], [address] Phone: [phone]   Fax:  [phone]  Name: Dorissa Stinnette MRN: [id number] Date of Birth: December 25, 1945

## 2019-12-11 NOTE — Patient Instructions (Signed)
Thank you allowing the Chronic Care Management Team to be a part of your care! It was a pleasure speaking with you today!     CCM (Chronic Care Management) Team    Noreene Larsson RN, MSN, CCM Nurse Care Coordinator  [phone]   Harlow Asa PharmD  Clinical Pharmacist  [phone]   Eula Fried LCSW Clinical Social Worker [phone]  Visit Information  Goals Addressed              This Visit's Progress   .  PharmD - Medication Assistance (pt-stated)        Current Barriers:  . Financial Barriers: patient has [place] [employer] Preferred Part D insurance and reports copay for Tresiba, Humalog, Bystolic and Colcrys is cost prohibitive at this time o Patient currently enrolled in patient assistance for Tresiba, Humalog and Colcrys for 2021 calendar year  Pharmacist Clinical Goal(s):  Marland Kitchen Over the next 30 days, patient will work with PharmD and providers to relieve medication access concerns  Interventions: . Follow up with Ms. Goebel regarding medication assistance o Reports received approval through [employer] to cover one time [place] refill. However, [facility] denied receiving Rx for [place] . Collaborate with PCP - Request provider resend [name] Rx. Rx resent and receipt confirmed with [facility] o Call patient with update.   Patient Self Care Activities:  . Patient to contact providers for new medical questions/concerns . Patient to attend scheduled medical appointments o Next appointment with Cardiology on 7/13  Please see past updates related to this goal by clicking on the "Past Updates" button in the selected goal         Patient verbalizes understanding of instructions provided today.   The care management team will reach out to the patient again over the next 30 days.   Harlow Asa, PharmD, [place] [employer] [phone]

## 2019-12-11 NOTE — Chronic Care Management (AMB) (Signed)
Chronic Care Management   Follow Up Note   12/11/2019 Name: [name] MRN: [id number] DOB: 1946/05/02  Referred by: Verl Bangs, FNP Reason for referral : Chronic Care Management (Patient Phone Call)   [name] is a 74 y.o. year old female who is a primary care patient of [name], [name], [place]. The CCM team was consulted for assistance with chronic disease management and care coordination needs.    Receive a voicemail from patient requesting a call back  Coordination of care call to [facility].  I reached out to [name] by phone today.   Review of patient status, including review of consultants reports, relevant laboratory and other test results, and collaboration with appropriate care team members and the patient's provider was performed as part of comprehensive patient evaluation and provision of chronic care management services.   .   Outpatient Encounter Medications as of 12/11/2019  Medication Sig  . acetaminophen (TYLENOL) 500 MG tablet Take 500 mg by mouth daily as needed.  . ARTIFICIAL TEAR OP Apply to eye as needed.   . BD INSULIN SYRINGE U/F 31G X 5/16" 1 ML MISC USE AS DIRECTED. WITH HUMALOG  . BIOTIN PO Take by mouth daily.  . Cholecalciferol (VITAMIN D3) 25 MCG (1000 UT) CAPS Take 4 capsules by mouth daily.   . clindamycin (CLEOCIN) 300 MG capsule Take 600 mg by mouth. 1 Hour before dental procedures  . cloNIDine (CATAPRES) 0.1 MG tablet Take 1 tablet (0.1 mg) by mouth twice daily as needed for a systolic blood pressure > 160  . COLCRYS 0.6 MG tablet Take 1 tablet (0.6 mg total) by mouth 2 (two) times daily as needed. For up to 3 days for pericarditis, or up to 7 days for gout or connective tissue disorder.  . cyclobenzaprine (FLEXERIL) 5 MG tablet TAKE 1 TABLET (5 MG TOTAL) BY MOUTH ONCE DAILY AS NEEDED FOR MUSCLE SPASMS  . ezetimibe (ZETIA) 10 MG tablet Take 1 tablet (10 mg total) by mouth daily.  . famotidine (PEPCID) 20 MG tablet  Take 20 mg by mouth 2 (two) times daily.  Marland Kitchen FREESTYLE LITE test strip USE AS DIRECTED THREE TIMES DAILY FOR DIABETES  . insulin degludec (TRESIBA FLEXTOUCH) 200 UNIT/ML FlexTouch Pen Inject 56 Units into the skin daily.  . insulin lispro (HUMALOG) 100 UNIT/ML injection Correction scale: take 1 unit per 50 over 150 before meals up to three times daily.  Up to 20 units per day.  . Insulin Pen Needle (PEN NEEDLES) 32G X 5 MM MISC 1 Device by Does not apply route daily.  Marland Kitchen lactase (LACTAID) 3000 units tablet Take by mouth as needed.   . Lancets (FREESTYLE) lancets Must test x4/day  . losartan (COZAAR) 50 MG tablet Take 1 tablet (50 mg total) by mouth daily. Can take 1.5 tablets (75mg  total) until follow up appointment on 12/07/19 with Laurann Montana NP (Patient taking differently: Take 75 mg by mouth daily. Can take 1.5 tablets (75mg  total) until follow up appointment on 12/07/19 with Laurann Montana NP)  . nebivolol (BYSTOLIC) 10 MG tablet Take 1 tablet (10 mg total) by mouth in the morning and at bedtime. (Patient taking differently: Take 10 mg by mouth in the morning and at bedtime. Taking 2 tablets daily)  . prednisoLONE acetate (PRED FORTE) 1 % ophthalmic suspension INSTILL ONE DROP  as needed  . rosuvastatin (CRESTOR) 10 MG tablet TAKE 1 TABLET BY MOUTH EVERY DAY   No facility-administered encounter medications  on file as of 12/11/2019.    Goals Addressed              This Visit's Progress   .  PharmD - Medication Assistance (pt-stated)        Current Barriers:  . Financial Barriers: patient has [place] [employer] Preferred Part D insurance and reports copay for Tresiba, Humalog, Bystolic and Colcrys is cost prohibitive at this time o Patient currently enrolled in patient assistance for Tresiba, Humalog and Colcrys for 2021 calendar year  Pharmacist Clinical Goal(s):  Marland Kitchen Over the next 30 days, patient will work with PharmD and providers to relieve medication access  concerns  Interventions: . Follow up with Ms. Villegas regarding medication assistance o Reports received approval through [employer] to cover one time [place] refill. However, [facility] denied receiving Rx for [place] . Collaborate with PCP - Request provider resend [name] Rx. Rx resent and receipt confirmed with [facility] o Call patient with update.   Patient Self Care Activities:  . Patient to contact providers for new medical questions/concerns . Patient to attend scheduled medical appointments o Next appointment with Cardiology on 7/13  Please see past updates related to this goal by clicking on the "Past Updates" button in the selected goal         Plan  The care management team will reach out to the patient again over the next 30 days.   Harlow Asa, PharmD, [place] [employer] [phone]

## 2019-12-17 ENCOUNTER — Ambulatory Visit: Payer: Medicare Other | Admitting: Physical Therapy

## 2019-12-17 ENCOUNTER — Other Ambulatory Visit: Payer: Self-pay

## 2019-12-17 DIAGNOSIS — G8929 Other chronic pain: Secondary | ICD-10-CM

## 2019-12-17 DIAGNOSIS — M545 Low back pain: Secondary | ICD-10-CM | POA: Diagnosis not present

## 2019-12-17 DIAGNOSIS — M6281 Muscle weakness (generalized): Secondary | ICD-10-CM | POA: Diagnosis not present

## 2019-12-17 NOTE — Therapy (Addendum)
[place] [facility] [address] [place], [place], [address] Phone: [phone]   Fax:  [phone]  Physical Therapy Treatment  Patient Details  Name: [name] MRN: [id number] Date of Birth: 06/20/1945 Referring Provider (PT): Dr. Parks Ranger   Encounter Date: 12/17/2019    Treatment: 11 of 12.  Recert date: 10/13/7320 0254 to 71   Past Medical History:  Diagnosis Date  . Anterolisthesis    Cervical spine  . Asthma   . Connective tissue disorder (HCC)    recurrent carotid arteritis, temporal arteritis, vasculitis mandible, general hepatitis, avascular necrosis bilat  . DDD (degenerative disc disease), lumbar   . Diabetes mellitus without complication ([place])   . Diverticulosis   . Duodenitis   . Gouty arthritis   . Hiatal hernia with GERD   . History of cardiovascular stress test    a. 07/2018 [facility]): Fixed inferoapical defect w/ nl contraction-->attenuation artifact. No ischemia. EF 63%.  . Hyperlipidemia   . Hypertension    a. 02/2019 Renal artery duplex: no evidence of prox RAS.  Marland Kitchen Hyperuricemia   . Keratitis sicca, bilateral ([place])   . Lymphedema of both lower extremities   . Osteoarthritis   . Pericarditis    Recurrent: Aug 97, July 05, Sept 08, July 16, July 18  . PUD (peptic ulcer disease)   . 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[place])   . Sjogren's syndrome ([place])   . Syncope   . Tendonitis, Achilles, right   . Tortuous colon   . Trigger finger     Past Surgical History:  Procedure Laterality Date  . ABDOMINAL HYSTERECTOMY    . BREAST BIOPSY    . BREAST EXCISIONAL BIOPSY Left 1986   neg  . CHOLECYSTECTOMY    . COLONOSCOPY WITH PROPOFOL N/A 08/26/2018   Procedure: COLONOSCOPY WITH PROPOFOL;  Surgeon: Lucilla Lame, MD;  Location: [facility] ENDOSCOPY;  Service: Endoscopy;  Laterality: N/A;  . CYSTOSCOPY  02/26/2018   [name], MD   . distal arthrectomy    . ESOPHAGOGASTRODUODENOSCOPY (EGD) WITH PROPOFOL N/A  08/26/2018   Procedure: ESOPHAGOGASTRODUODENOSCOPY (EGD) WITH PROPOFOL;  Surgeon: Lucilla Lame, MD;  Location[facility] END[facility]COPY;  Service: Endoscopy;  Laterality: N/A;  . EXCISION NEUROMA    . KNEE SURGERY Bilateral    1985, 2001, 11/2002, 12/2006  . panhysterectomy  10/1983  . SHOULDER SURGERY Right    reverse total arthroplasty w/ biceps tenodesis  . TONSILLECTOMY AND ADENOIDECTOMY    . TOTAL HIP ARTHROPLASTY Right 04/2007  . WISDOM TOOTH EXTRACTION      There were no vitals filed for this visit.       Seated BP: 158/78. Pt. brought out daily log of BP and had a 213/110 documented. PT discussed the importance of managing BP and contracting MD when that elevated.      There:    Supine SLR/ SAQ unsupported/trunk rotn./ partial bridging/ marching 10x2 each.  (Slight assist from PT) Reviewed HEP  Manual tx.:  Generalized supine LE/lumbar stretches (19 min.) STM to lumbar/ hip in seated/ sidelying position.  Hypervolt as tolerated.       PT Long Term Goals - 11/15/19 1524      PT LONG TERM GOAL #1   Title Pt. will be independent with HEP to increase B hip/LE muscle strengthening 1/2 muscle grade to improve pain-free mobility/ upright posture.    Baseline Generalized B LE muscle weakness grossly 4/5 MMT (pain limited L hip/ R knee).    Time 8  Period Weeks    Status Not Met    Target Date 12/17/19      PT LONG TERM GOAL #2   Title Pt. will increase FOTO to 58 to improve pain-free mobility.    Baseline Initial FOTO: 53    Time 8    Period Weeks    Status On-going    Target Date 12/17/19      PT LONG TERM GOAL #3   Title Pt. will demonstrate/ maintain proper upright posture with standing tolerance with no increase c/o back pain to improve cooking.    Baseline Moderate rounded shoulders/ forward head posture.    Time 4    Period Weeks    Status Partially Met    Target Date 12/17/19      PT LONG TERM GOAL #4   Title Pt. will report 4/10 low back pain at worst  with no radicular symptoms to improve ADLs/ household chores.    Baseline Pt. reports no pain in back currently at rest but >6/10 pain with increase activity.    Time 4    Period Weeks    Status Not Met    Target Date 12/17/19           Pt. c/o low back muscle spasms several times during tx. sessions. PT focusing on LE/lumbar stretches in supine and sidelying positions and instructing pt. to complete more ther.ex./walking at home when BP under control. Pt. demonstrates good movement/ position changes from supine to sitting posture on mat table. Generalized muscle tenderness in hips/ low back remains. PT will reassess goals next tx. session.        Patient will benefit from skilled therapeutic intervention in order to improve the following deficits and impairments:  Abnormal gait, Pain, Improper body mechanics, Postural dysfunction, Decreased mobility, Decreased activity tolerance, Decreased endurance, Decreased range of motion, Decreased strength, Impaired UE functional use, Impaired flexibility, Difficulty walking  Visit Diagnosis: Chronic bilateral low back pain without sciatica  Muscle weakness (generalized)     Problem List Patient Active Problem List   Diagnosis Date Noted  . Multiple thyroid nodules 12/28/2019  . Thyroid nodule 09/29/2019  . Lymphedema 11/10/2018  . PAD (peripheral artery disease) ([place]) 10/12/2018  . Aortic atherosclerosis ([place]) 10/12/2018  . Carotid stenosis, asymptomatic, bilateral 10/12/2018  . Positive colorectal cancer screening using Cologuard test 10/09/2018  . Gastroesophageal reflux disease   . Acute gastritis without hemorrhage   . Osteoarthritis 04/29/2018  . Hiatal hernia with GERD 04/29/2018  . Hyperlipidemia 04/29/2018  . Lymphedema of both lower extremities 04/29/2018  . Left knee pain 10/23/2016  . Cataracts, bilateral 07/31/2016  . Glaucoma 07/31/2016  . Lateral epicondylitis of left elbow 10/20/2015  . Vitamin D deficiency  10/20/2015  . Type 2 diabetes, controlled, with neuropathy ([place]) 10/16/2015  . Gouty arthritis 06/03/2015  . H/O syncope 06/03/2015  . Mild intermittent asthma 06/03/2015  . Multiple gastric ulcers 06/03/2015  . Schatzki's ring 06/03/2015  . Undifferentiated connective tissue disease ([place]) 06/03/2015  . Bone disorder 04/05/2015  . Dental injury 04/05/2015  . Pericarditis 04/05/2015  . Sjogren's syndrome ([place]) 09/07/2014  . Elevated alkaline phosphatase level 08/24/2014  . Lactose intolerance 08/24/2014  . Obesity 08/24/2014  . Peripheral edema 08/24/2014  . 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 Abdominal adhesions 08/02/2014  . 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[place]) 08/02/2014  . Degenerative joint disease (DJD) of lumbar spine 08/02/2014  . Diverticulosis of both small and  large intestine 08/02/2014  . Hyperuricemia 08/02/2014  . Pure hypercholesterolemia 08/02/2014  . Trigger finger 08/02/2014  . Right shoulder pain 07/29/2014  . Benign neoplasm of colon 06/30/2010  . Right upper quadrant pain 06/30/2010   Pura Spice, PT, DPT # [phone]  12/19/2019, 12:51 PM  [facility] [address] [place], [place], [address] Phone: [phone]   Fax:  [phone]  Name: [name] MRN: [id number] Date of Birth: 1945-12-29

## 2019-12-21 ENCOUNTER — Telehealth: Payer: Self-pay

## 2019-12-21 NOTE — Progress Notes (Signed)
Cardiology Office Note  Date:  12/22/2019   ID:  [name], [name] 10/13/1945, MRN [id number]  PCP:  Verl Bangs, FNP   Chief Complaint  Patient presents with   office visit    Pt has concerns w/ elevated BP. Meds verbally reviewed w/ pt.    HPI:  [name] is a 74 year old woman with past medical history of Former smoker  PAD/carotid HTN urinary incontinence, chronic UTIs Morbid obesity Moderate to large hiatal hernia. bleeding ulcers x 3, Aortic atherosclerosis on CT scan Presenting for  resistant hypertension, PAD  In follow-up today she reports having labile blood pressure BP still elevated, sometimes up to 295 systolic, Range seems to be 140s to 170s  Current blood pressure medications Clonidine BID, sometimes extra clonidine Losartan 75 daily bystolic 10 twice a day  Some dry mouth and fatigue with clonidine but tolerable  Seen in the hospital September 10, 2019 at [facility] Concerned about " aneurysm" of her aorta seen on CT scan at [facility] pulled up and reviewed with her today, no mention of aortic aneurysm She was told it was 3.3 to 3.5 cm by a surgeon CT did show mild aortic atherosclerosis of aorta, small saccular aneurysm or less favored a penetrating aortic ulcer in the aortic arch, lateral aspect  She continues to work with PT Previously reported stress at home, husband with stroke  Intolerance of HCTZ, sulfa Unable to tolerate calcium channel blocker secondary to leg swelling and lymphedema  She uses her lymphedema pumps daily Goes to bed at 1 AM  CT chest at [facility] Mild atherosclerosis in the thoracic aorta.  Labs reviewed HBA1C 8.4 Total chol 169, LDL 100 Sed rate 67, CRP 8.4  Carotid u/s , reviewed again on today's visit 2019 Less than 50% stenosis in the right and left internal carotid Arteries. No indication for repeat at this time  EKG personally reviewed by myself on todays visit Shows sinus bradycardia rate 58 bpm no  significant ST-T wave changes  Other past medical hx  long history of pericarditis, pericardial effusion Previously managed by [facility] Reports being treated with colchicine in the past, Also previously treated with long course of steroids for unspecified connective tissue disorder  CT scan 06/2018 Mild to moderate diffuse descending aortic atherosclerosis extending into the common iliac arteries Coronary calcification in the right coronary artery  Carotid u/s 05/2018 Less than 50% stenosis in the right and left internal carotid Arteries.   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  PSH:    Past Surgical History:  Procedure Laterality Date   ABDOMINAL HYSTERECTOMY     BREAST BIOPSY     BREAST EXCISIONAL BIOPSY Left 1986   neg   CHOLECYSTECTOMY     COLONOSCOPY WITH PROPOFOL N/A 08/26/2018   Procedure: COLONOSCOPY WITH PROPOFOL;  Surgeon: Lucilla Lame, MD;  Location: [facility] ENDOSCOPY;  Service: Endoscopy;  Laterality: N/A;   CYSTOSCOPY  02/26/2018   [name], MD    distal arthrectomy     ESOPHAGOGASTRODUODENOSCOPY (EGD) WITH PROPOFOL N/A 08/26/2018   Procedure: ESOPHAGOGASTRODUODENOSCOPY (EGD) WITH PROPOFOL;  Surgeon: Lucilla Lame,  MD;  Location: [facility] ENDOSCOPY;  Service: Endoscopy;  Laterality: N/A;   EXCISION NEUROMA     KNEE SURGERY Bilateral    1985, 2001, 11/2002, 12/2006   panhysterectomy  10/1983   SHOULDER SURGERY Right     reverse total arthroplasty w/ biceps tenodesis   TONSILLECTOMY AND ADENOIDECTOMY     TOTAL HIP ARTHROPLASTY Right 04/2007   WISDOM TOOTH EXTRACTION      Current Outpatient Medications  Medication Sig Dispense Refill   acetaminophen (TYLENOL) 500 MG tablet Take 500 mg by mouth daily as needed.     ARTIFICIAL TEAR OP Apply to eye as needed.      BD INSULIN SYRINGE U/F 31G X 5/16" 1 ML MISC USE AS DIRECTED. WITH HUMALOG 100 each 5   BIOTIN PO Take by mouth daily.     Cholecalciferol (VITAMIN D3) 25 MCG (1000 UT) CAPS Take 4 capsules by mouth daily.      clindamycin (CLEOCIN) 300 MG capsule Take 600 mg by mouth. 1 Hour before dental procedures     cloNIDine (CATAPRES) 0.1 MG tablet Take 1 tablet (0.1 mg) by mouth twice daily as needed for a systolic blood pressure > 160 60 tablet 6   COLCRYS 0.6 MG tablet Take 1 tablet (0.6 mg total) by mouth 2 (two) times daily as needed. For up to 3 days for pericarditis, or up to 7 days for gout or connective tissue disorder. 60 tablet 2   cyclobenzaprine (FLEXERIL) 5 MG tablet TAKE 1 TABLET (5 MG TOTAL) BY MOUTH ONCE DAILY AS NEEDED FOR MUSCLE SPASMS 30 tablet 2   ezetimibe (ZETIA) 10 MG tablet Take 1 tablet (10 mg total) by mouth daily. 90 tablet 3   famotidine (PEPCID) 20 MG tablet Take 20 mg by mouth 2 (two) times daily.     FREESTYLE LITE test strip USE AS DIRECTED THREE TIMES DAILY FOR DIABETES 100 strip 4   insulin degludec (TRESIBA FLEXTOUCH) 200 UNIT/ML FlexTouch Pen Inject 56 Units into the skin daily. 3 pen 11   insulin lispro (HUMALOG) 100 UNIT/ML injection Correction scale: take 1 unit per 50 over 150 before meals up to three times daily.  Up to 20 units per day. 20 mL 1   Insulin Pen Needle (PEN NEEDLES) 32G X 5 MM MISC 1 Device by Does not apply route daily. 100 each 3   lactase (LACTAID) 3000 units tablet Take by mouth as needed.      Lancets (FREESTYLE) lancets Must test x4/day     losartan (COZAAR) 50 MG tablet Take 1  tablet (50 mg total) by mouth daily. Can take 1.5 tablets (75mg  total) until follow up appointment on 12/07/19 with Laurann Montana NP (Patient taking differently: Take 75 mg by mouth daily. Can take 1.5 tablets (75mg  total) until follow up appointment on 12/07/19 with Laurann Montana NP) 45 tablet 6   nebivolol (BYSTOLIC) 10 MG tablet Take 1 tablet (10 mg total) by mouth in the morning and at bedtime. (Patient taking differently: Take 10 mg by mouth in the morning and at bedtime. Taking 2 tablets daily) 60 tablet 6   prednisoLONE acetate (PRED FORTE) 1 % ophthalmic suspension INSTILL ONE DROP  as needed     rosuvastatin (CRESTOR) 10 MG tablet TAKE 1 TABLET BY MOUTH EVERY DAY 90 tablet 1   No current facility-administered medications for this visit.     Allergies:   Amlodipine, Aspirin, Bee venom, Ciprofloxacin, Codeine, Erythromycin, Glimepiride, Hydralazine, Hydrochlorothiazide, Hydrocodone, Hydrocodone-acetaminophen, Hydromorphone, Irbesartan, Irbesartan-hydrochlorothiazide, Lisinopril, Maxitrol [  neomycin-polymyxin-dexameth], Metformin and related, Metformin hcl, Methylcellulose, Metoclopramide, Metoprolol, Neomycin-bacitracin zn-polymyx, Norvasc [amlodipine besylate], Nsaids, Omeprazole magnesium, Other, Oxycodone-acetaminophen, Penicillins, Pioglitazone, Poison sumac extract, Prilosec [omeprazole], Silver sulfadiazine, Sulfa antibiotics, Tape, Tetanus toxoid, Tramadol, and Betadine [povidone iodine]   Social History:  The patient  reports that she quit smoking about 23 years ago. She has never used smokeless tobacco. She reports current alcohol use. She reports that she does not use drugs.   Family History:   family history is not on file.    Review of Systems: Review of Systems  Constitutional: Negative.   HENT: Negative.   Respiratory: Negative.   Cardiovascular: Negative.   Gastrointestinal: Negative.   Musculoskeletal: Negative.   Neurological: Negative.   Psychiatric/Behavioral:  Negative.   All other systems reviewed and are negative.   PHYSICAL EXAM: VS:  BP (!) 164/84 (BP Location: Left Arm, Patient Position: Sitting, Cuff Size: Normal)    Pulse (!) 58    Ht 5\' 9"  (1.753 m)    Wt 240 lb 2 oz (108.9 kg)    SpO2 98%    BMI 35.46 kg/m  , BMI Body mass index is 35.46 kg/m.  Constitutional:  oriented to person, place, and time. No distress.     Recent Labs: 12/07/2019: ALT 20; BUN 20; Creatinine, Ser 1.05; Hemoglobin 14.9; Platelets 288; Potassium 4.6; Sodium 137; TSH 0.901    Lipid Panel Lab Results  Component Value Date   CHOL 155 12/07/2019   HDL 41 12/07/2019   LDLCALC 64 12/07/2019   TRIG 248 (H) 12/07/2019    Wt Readings from Last 3 Encounters:  12/22/19 240 lb 2 oz (108.9 kg)  12/07/19 241 lb 6 oz (109.5 kg)  12/01/19 240 lb (108.9 kg)      ASSESSMENT AND PLAN:  Aortic atherosclerosis (HCC) Continue Crestor and Zetia, goal LDL less than 70 Lipids reviewed, LDL at goal  PAD (peripheral artery disease) (HCC) aortic atherosclerosis, carotid calcification Medical management recommended  Essential hypertension  extensive list of medication intolerances,  Reviewed with her in detail, long discussion concerning various treatment options for her blood pressure Recommend clonidine 0.1 mg 3 times daily, losartan 50 twice daily, continue bystolic 10 mg twice daily For spikes in pressure could take extra clonidine  Pure hypercholesterolemia Continue Crestor Zetia  Lymphedema - Using her compression pumps for 1 hour daily Chronic lower extremity edema  Abdominal swelling Despite abdominal bloating, leg swelling she does not want a diuretic Reports having sulfa allergy  Disposition:   F/U  6 months   Total encounter time more than 25 minutes  Greater than 50% was spent in counseling and coordination of care with the patient    No orders of the defined types were placed in this encounter.    Signed, Esmond Plants, M.D., Ph.D. 12/22/2019   [place], [place]

## 2019-12-22 ENCOUNTER — Ambulatory Visit (INDEPENDENT_AMBULATORY_CARE_PROVIDER_SITE_OTHER): Payer: Medicare Other | Admitting: Cardiovascular Disease

## 2019-12-22 ENCOUNTER — Other Ambulatory Visit: Payer: Self-pay

## 2019-12-22 ENCOUNTER — Encounter: Payer: Self-pay | Admitting: Cardiovascular Disease

## 2019-12-22 VITALS — BP 164/84 | HR 58 | Ht 69.0 in | Wt 240.1 lb

## 2019-12-22 DIAGNOSIS — E7800 Pure hypercholesterolemia, unspecified: Secondary | ICD-10-CM

## 2019-12-22 DIAGNOSIS — I890 Lymphedema, not elsewhere classified: Secondary | ICD-10-CM

## 2019-12-22 DIAGNOSIS — I739 Peripheral vascular disease, unspecified: Secondary | ICD-10-CM

## 2019-12-22 DIAGNOSIS — I6523 Occlusion and stenosis of bilateral carotid arteries: Secondary | ICD-10-CM | POA: Diagnosis not present

## 2019-12-22 DIAGNOSIS — I7122 Aneurysm of the aortic arch, without rupture: Secondary | ICD-10-CM

## 2019-12-22 DIAGNOSIS — I712 Thoracic aortic aneurysm, without rupture: Secondary | ICD-10-CM

## 2019-12-22 DIAGNOSIS — I700 Atherosclerosis of aorta: Secondary | ICD-10-CM

## 2019-12-22 DIAGNOSIS — I10 Essential (primary) hypertension: Secondary | ICD-10-CM

## 2019-12-22 MED ORDER — CLONIDINE HCL 0.1 MG PO TABS: ORAL_TABLET | ORAL | 3 refills | Status: DC

## 2019-12-22 MED ORDER — NEBIVOLOL HCL 20 MG PO TABS: 20.0000 mg | ORAL_TABLET | Freq: Every day | ORAL | 3 refills | Status: DC

## 2019-12-22 MED ORDER — LOSARTAN POTASSIUM 100 MG PO TABS: 100.0000 mg | ORAL_TABLET | Freq: Every day | ORAL | 3 refills | Status: DC

## 2019-12-22 NOTE — Patient Instructions (Addendum)
Medication Instructions:  Please increase the losartan up to 50 twice a day  Clonidine 9 AM, 4 pm, before 1 am  If you need a refill on your cardiac medications before your next appointment, please call your pharmacy.    Lab work: No new labs needed   If you have labs (blood work) drawn today and your tests are completely normal, you will receive your results only by: Marland Kitchen MyChart Message (if you have MyChart) OR . A paper copy in the mail If you have any lab test that is abnormal or we need to change your treatment, we will call you to review the results.   Testing/Procedures: No new testing needed   Follow-Up: At [facility], you and your health needs are our priority.  As part of our continuing mission to provide you with exceptional heart care, we have created designated Provider Care Teams.  These Care Teams include your primary Cardiologist (physician) and Advanced Practice Providers (APPs -  Physician Assistants and Nurse Practitioners) who all work together to provide you with the care you need, when you need it.  . You will need a follow up appointment in 6 months .  Marland Kitchen Providers on your designated Care Team:   . Murray Hodgkins, NP . Christell Faith, PA-C . Marrianne Mood, PA-C  Any Other Special Instructions Will Be Listed Below (If Applicable).  For educational health videos Log in to : [email or web address] Or : [email or web address], password : [email or web address]

## 2019-12-23 ENCOUNTER — Telehealth: Payer: Self-pay | Admitting: Cardiovascular Disease

## 2019-12-23 NOTE — Telephone Encounter (Signed)
Spoke with patient and reviewed how holding some medications can cause rebound hypertension. So encouraged her to try taking it next time to see if she will tolerate it. She also inquired about the different readings between both arms. She was concerned of a possible occluded subclavian artery that could be the cause of the different readings. We had extensive conversation about her various health issues. Advised that I would make Dr. Rockey Situ aware of the blood pressure readings and recommendations to not hold medication to prevent any possible rebound. She verbalized understanding of our conversation and had no further questions at this time.

## 2019-12-23 NOTE — Telephone Encounter (Signed)
Pt c/o BP issue: STAT if pt c/o blurred vision, one-sided weakness or slurred speech  1. What are your last 5 BP readings?   7/13 after ov  L 109/59   R 132/65   Held pm meds felt strange  7/14 at 430 am 187/95  Took meds she held   7/14 at 930am  160/87    2. Are you having any other symptoms (ex. Dizziness, headache, blurred vision, passed out)?   Strange   3. What is your BP issue? Concerned with drastic difference in L and R arm .  Concerned could be occluded vessel .

## 2019-12-24 ENCOUNTER — Ambulatory Visit: Payer: Medicare Other | Admitting: Physical Therapy

## 2019-12-24 ENCOUNTER — Other Ambulatory Visit: Payer: Self-pay

## 2019-12-24 DIAGNOSIS — M545 Low back pain, unspecified: Secondary | ICD-10-CM

## 2019-12-24 DIAGNOSIS — M6281 Muscle weakness (generalized): Secondary | ICD-10-CM | POA: Diagnosis not present

## 2019-12-24 DIAGNOSIS — G8929 Other chronic pain: Secondary | ICD-10-CM | POA: Diagnosis not present

## 2019-12-25 NOTE — Therapy (Addendum)
[facility] [address] [place], [place], [address] Phone: [phone]   Fax:  [phone]  Physical Therapy Treatment/Recertification  Patient Details  Name: [name] MRN: [id number] Date of Birth: Oct 27, 1945 Referring Provider (PT): Dr. Parks Ranger   Encounter Date: 12/24/2019    Treatment: 12 of 24.  Recert date: 71/07/1973 1646 to [place]   Past Medical History:  Diagnosis Date  . Anterolisthesis    Cervical spine  . Asthma   . Connective tissue disorder (HCC)    recurrent carotid arteritis, temporal arteritis, vasculitis mandible, general hepatitis, avascular necrosis bilat  . DDD (degenerative disc disease), lumbar   . Diabetes mellitus without complication ([place])   . Diverticulosis   . Duodenitis   . Gouty arthritis   . Hiatal hernia with GERD   . History of cardiovascular stress test    a. 07/2018 [facility]): Fixed inferoapical defect w/ nl contraction-->attenuation artifact. No ischemia. EF 63%.  . Hyperlipidemia   . Hypertension    a. 02/2019 Renal artery duplex: no evidence of prox RAS.  Marland Kitchen Hyperuricemia   . Keratitis sicca, bilateral ([place])   . Lymphedema of both lower extremities   . Osteoarthritis   . Pericarditis    Recurrent: Aug 97, July 05, Sept 08, July 16, July 18  . PUD (peptic ulcer disease)   . 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[place])   . Sjogren's syndrome ([place])   . Syncope   . Tendonitis, Achilles, right   . Tortuous colon   . Trigger finger     Past Surgical History:  Procedure Laterality Date  . ABDOMINAL HYSTERECTOMY    . BREAST BIOPSY    . BREAST EXCISIONAL BIOPSY Left 1986   neg  . CHOLECYSTECTOMY    . COLONOSCOPY WITH PROPOFOL N/A 08/26/2018   Procedure: COLONOSCOPY WITH PROPOFOL;  Surgeon: Lucilla Lame, MD;  Location[facility] ENDOSCOPY;  Service: Endoscopy;  Laterality: N/A;  . CYSTOSCOPY  02/26/2018   [name], MD   . distal arthrectomy    . ESOPHAGOGASTRODUODENOSCOPY (EGD) WITH  PROPOFOL N/A 08/26/2018   Procedure: ESOPHAGOGASTRODUODENOSCOPY (EGD) WITH PROPOFOL;  Surgeon: Lucilla Lame, MD;  Location: [facility] ENDOSCOPY;  Service: Endoscopy;  Laterality: N/A;  . EXCISION NEUROMA    . KNEE SURGERY Bilateral    1985, 2001, 11/2002, 12/2006  . panhysterectomy  10/1983  . SHOULDER SURGERY Right    reverse total arthroplasty w/ biceps tenodesis  . TONSILLECTOMY AND ADENOIDECTOMY    . TOTAL HIP ARTHROPLASTY Right 04/2007  . WISDOM TOOTH EXTRACTION        There were no vitals filed for this visit. BP: 138/78. Pts. best BP recording since starting PT. Pt. reports compliance with BP medications and logging BP numbers. Pt. reports back pain is present but no subjective pain score given.        Manual:  Supine LE/lumbar generalized stretches (no Hypervolt today)- Supine sciatic nerve glides  There.ex.:  Standing resisted gait 1BTB with cuing for upright posture all 4-planes (5x each) Standing RTB scap. Retraction/ extension/ bicep curls with mirror feedback Sit to stands from mat table Reviewed supine LE there.ex.          Pt. has shown slow but consistent progress with increase lumbar mobility/ function with less pain. Pts. ther.ex. has been limited by elevated BP and significant B lower leg/ feet lymphadema. PT recommends pt. see OT for lymph treatment/ wrapping/ massage. See updated goals. PT recommends pt. continue with skilled PT services with focus on  more ther.ex. now that BP is more under control. PT recommends tx. 1x/week with focus on HEP.           PT Long Term Goals - 12/25/19 1313      PT LONG TERM GOAL #1   Title Pt. will be independent with HEP to increase B hip/LE muscle strengthening 1/2 muscle grade to improve pain-free mobility/ upright posture.    Baseline Generalized B LE muscle weakness grossly 4/5 MMT (pain limited L hip/ R knee).    Time 8    Period Weeks    Status Not Met    Target Date 03/17/20      PT LONG TERM GOAL #2    Title Pt. will increase FOTO to 58 to improve pain-free mobility.    Baseline Initial FOTO: 53    Time 12    Period Weeks    Status On-going    Target Date 03/17/20      PT LONG TERM GOAL #3   Title Pt. will demonstrate/ maintain proper upright posture with standing tolerance with no increase c/o back pain to improve cooking.    Baseline Moderate rounded shoulders/ forward head posture.    Time 12    Period Weeks    Status Partially Met    Target Date 03/17/20      PT LONG TERM GOAL #4   Title Pt. will report 4/10 low back pain at worst with no radicular symptoms to improve ADLs/ household chores.    Baseline Pt. reports no pain in back currently at rest but >6/10 pain with increase activity.    Time 12    Period Weeks    Status Partially Met    Target Date 03/17/20                  Patient will benefit from skilled therapeutic intervention in order to improve the following deficits and impairments:  Abnormal gait, Pain, Improper body mechanics, Postural dysfunction, Decreased mobility, Decreased activity tolerance, Decreased endurance, Decreased range of motion, Decreased strength, Impaired UE functional use, Impaired flexibility, Difficulty walking  Visit Diagnosis: Chronic bilateral low back pain without sciatica  Muscle weakness (generalized)     Problem List Patient Active Problem List   Diagnosis Date Noted  . Multiple thyroid nodules 12/28/2019  . Thyroid nodule 09/29/2019  . Lymphedema 11/10/2018  . PAD (peripheral artery disease) ([place]) 10/12/2018  . Aortic atherosclerosis ([place]) 10/12/2018  . Carotid stenosis, asymptomatic, bilateral 10/12/2018  . Positive colorectal cancer screening using Cologuard test 10/09/2018  . Gastroesophageal reflux disease   . Acute gastritis without hemorrhage   . Osteoarthritis 04/29/2018  . Hiatal hernia with GERD 04/29/2018  . Hyperlipidemia 04/29/2018  . Lymphedema of both lower extremities 04/29/2018  . Left knee  pain 10/23/2016  . Cataracts, bilateral 07/31/2016  . Glaucoma 07/31/2016  . Lateral epicondylitis of left elbow 10/20/2015  . Vitamin D deficiency 10/20/2015  . Type 2 diabetes, controlled, with neuropathy ([place]) 10/16/2015  . Gouty arthritis 06/03/2015  . H/O syncope 06/03/2015  . Mild intermittent asthma 06/03/2015  . Multiple gastric ulcers 06/03/2015  . Schatzki's ring 06/03/2015  . Undifferentiated connective tissue disease ([place]) 06/03/2015  . Bone disorder 04/05/2015  . Dental injury 04/05/2015  . Pericarditis 04/05/2015  . Sjogren's syndrome ([place]) 09/07/2014  . Elevated alkaline phosphatase level 08/24/2014  . Lactose intolerance 08/24/2014  . Obesity 08/24/2014  . Peripheral edema 08/24/2014  . 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  Abdominal adhesions 08/02/2014  . 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[place]) 08/02/2014  . Degenerative joint disease (DJD) of lumbar spine 08/02/2014  . Diverticulosis of both small and large intestine 08/02/2014  . Hyperuricemia 08/02/2014  . Pure hypercholesterolemia 08/02/2014  . Trigger finger 08/02/2014  . Right shoulder pain 07/29/2014  . Benign neoplasm of colon 06/30/2010  . Right upper quadrant pain 06/30/2010   Pura Spice, PT, DPT # [phone]  12/25/2019 1:15 PM  [facility] [address] [place], [place], [address] Phone: [phone]   Fax:  [phone]  Name: Dawnn Nam MRN: [id number] Date of Birth: 16-Aug-1945

## 2019-12-28 ENCOUNTER — Ambulatory Visit (INDEPENDENT_AMBULATORY_CARE_PROVIDER_SITE_OTHER): Payer: Medicare Other | Admitting: Internal Medicine

## 2019-12-28 ENCOUNTER — Encounter: Payer: Self-pay | Admitting: Internal Medicine

## 2019-12-28 ENCOUNTER — Other Ambulatory Visit: Payer: Self-pay

## 2019-12-28 ENCOUNTER — Telehealth: Payer: Self-pay

## 2019-12-28 VITALS — BP 142/80 | HR 61 | Ht 69.0 in | Wt 238.0 lb

## 2019-12-28 DIAGNOSIS — I6523 Occlusion and stenosis of bilateral carotid arteries: Secondary | ICD-10-CM

## 2019-12-28 DIAGNOSIS — E042 Nontoxic multinodular goiter: Secondary | ICD-10-CM | POA: Diagnosis not present

## 2019-12-28 NOTE — Patient Instructions (Addendum)
We can just follow the thyroid nodules for now.  Please return to see me in 1 year.  Thyroid Nodule  A thyroid nodule is an isolated growth of thyroid cells that forms a lump in your thyroid gland. The thyroid gland is a butterfly-shaped gland. It is found in the lower front of your neck. This gland sends chemical messengers (hormones) through your blood to all parts of your body. These hormones are important in regulating your body temperature and helping your body to use energy. Thyroid nodules are common. Most are not cancerous (benign). You may have one nodule or several nodules. Different types of thyroid nodules include nodules that:  Grow and fill with fluid (thyroid cysts).  Produce too much thyroid hormone (hot nodules or hyperthyroid).  Produce no thyroid hormone (cold nodules or hypothyroid).  Form from cancer cells (thyroid cancers). What are the causes? In most cases, the cause of this condition is not known. What increases the risk? The following factors may make you more likely to develop this condition.  Age. Thyroid nodules become more common in people who are older than 73 years of age.  Gender. ? Benign thyroid nodules are more common in women. ? Cancerous (malignant) thyroid nodules are more common in men.  A family history that includes: ? Thyroid nodules. ? Pheochromocytoma. ? Thyroid carcinoma. ? Hyperparathyroidism.  Certain kinds of thyroid diseases, such as Hashimoto's thyroiditis.  Lack of iodine in your diet.  A history of head and neck radiation, such as from previous cancer treatment. What are the signs or symptoms? In many cases, there are no symptoms. If you have symptoms, they may include:  A lump in your lower neck.  Feeling a lump or tickle in your throat.  Pain in your neck, jaw, or ear.  Having trouble swallowing. Hot nodules may cause symptoms that include:  Weight loss.  Warm, flushed skin.  Feeling hot.  Feeling  nervous.  A racing heartbeat. Cold nodules may cause symptoms that include:  Weight gain.  Dry skin.  Brittle hair. This may also occur with hair loss.  Feeling cold.  Fatigue. Thyroid cancer nodules may cause symptoms that include:  Hard nodules that feel stuck to the thyroid gland.  Hoarseness.  Lumps in the glands near your thyroid (lymph nodes). How is this diagnosed? A thyroid nodule may be felt by your health care provider during a physical exam. This condition may also be diagnosed based on your symptoms. You may also have tests, including:  An ultrasound. This may be done to confirm the diagnosis.  A biopsy. This involves taking a sample from the nodule and looking at it under a microscope.  Blood tests to make sure that your thyroid is working properly.  A thyroid scan. This test uses a radioactive tracer injected into a vein to create an image of the thyroid gland on a computer screen.  Imaging tests such as MRI or CT scan. These may be done if: ? Your nodule is large. ? Your nodule is blocking your airway. ? Cancer is suspected. How is this treated? Treatment depends on the cause and size of your nodule or nodules. If the nodule is benign, treatment may not be necessary. Your health care provider may monitor the nodule to see if it goes away without treatment. If the nodule continues to grow, is cancerous, or does not go away, treatment may be needed. Treatment may include:  Having a cystic nodule drained with a needle.  Ablation therapy.  In this treatment, alcohol is injected into the area of the nodule to destroy the cells. Ablation with heat (thermal ablation) may also be used.  Radioactive iodine. In this treatment, radioactive iodine is given as a pill or liquid that you drink. This substance causes the thyroid nodule to shrink.  Surgery to remove the nodule. Part or all of your thyroid gland may need to be removed as well.  Medicines. Follow these  instructions at home:  Pay attention to any changes in your nodule.  Take over-the-counter and prescription medicines only as told by your health care provider.  Keep all follow-up visits as told by your health care provider. This is important. Contact a health care provider if:  Your voice changes.  You have trouble swallowing.  You have pain in your neck, ear, or jaw that is getting worse.  Your nodule gets bigger.  Your nodule starts to make it harder for you to breathe.  Your muscles look like they are shrinking (muscle wasting). Get help right away if:  You have chest pain.  There is a loss of consciousness.  You have a sudden fever.  You feel confused.  You are seeing or hearing things that other people do not see or hear (having hallucinations).  You feel very weak.  You have mood swings.  You feel very restless.  You feel suddenly nauseous or throw up.  You suddenly have diarrhea. Summary  A thyroid nodule is an isolated growth of thyroid cells that forms a lump in your thyroid gland.  Thyroid nodules are common. Most are not cancerous (benign). You may have one nodule or several nodules.  Treatment depends on the cause and size of your nodule or nodules. If the nodule is benign, treatment may not be necessary.  Your health care provider may monitor the nodule to see if it goes away without treatment. If the nodule continues to grow, is cancerous, or does not go away, treatment may be needed. This information is not intended to replace advice given to you by your health care provider. Make sure you discuss any questions you have with your health care provider. Document Revised: 01/10/2018 Document Reviewed: 01/13/2018 Elsevier Patient Education  [place].

## 2019-12-28 NOTE — Progress Notes (Signed)
Patient ID: [name], female   DOB: Sep 29, 1945, 74 y.o.   MRN: [id number]   This visit occurred during the SARS-CoV-2 public health emergency.  Safety protocols were in place, including screening questions prior to the visit, additional usage of staff PPE, and extensive cleaning of exam room while observing appropriate contact time as indicated for disinfecting solutions.   HPI  [name] is a 74 y.o.-year-old female, referred by her PCP, Malfi, [name], FNP, for evaluation for 2 thyroid nodules.  She was found to have a thyroid nodule on a CT chest checked for Ao aneurysm.  Thyroid U/S (12/24/2019) - only the report is available, not the images - 2 complex thyroid nodules:  Right superior 0.9 cm nodule - no f/u needed  Right inferior 1.5 cm nodule - f/u U/S recommended in a year    Pt denies: - feeling nodules in neck - hoarseness - dysphagia - choking - SOB with lying down  I reviewed pt's thyroid tests and they have been normal: Lab Results  Component Value Date   TSH 0.901 12/07/2019   TSH 0.69 10/07/2019   TSH 1.17 05/26/2018   FREET4 1.0 05/26/2018    No FH of thyroid ds. No FH of thyroid cancer. + h/o radiation tx to head or neck x2 in the past for tonsillitis - in the 1950. She has a h/o ulcers in her mouth as a child.  No seaweed or kelp. No recent contrast studies. No steroid use recently. No herbal supplements. On Biotin supplements  - 10,000 mcg daily.  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  Latest HbA1c reviewed and this was 8.2% on 12/01/2019.  She is on [place] and Humalog. This is managed by PCP. She also has HL, HTN -she was ruled out for pheochromocytoma. She has a h/o GERD, hiatal hernia,  Schatzki's ring, h/o cholangitis.  ROS: Constitutional: no weight gain/weight loss, no fatigue, + heat intolerance, + nocturia Eyes: no blurry vision, no xerophthalmia ENT: no sore throat,  + see HPI Cardiovascular: no CP/SOB/palpitations/+ leg swelling Respiratory: no cough/SOB Gastrointestinal: + N/no V/D/C/+ acid reflux Musculoskeletal: + Both muscle/joint aches Skin: no rashes, + hair loss Neurological: no tremors/numbness/tingling/dizziness Psychiatric: no depression/anxiety  Past Medical History:  Diagnosis Date  . Anterolisthesis    Cervical spine  . Asthma   . Connective tissue disorder (HCC)    recurrent carotid arteritis, temporal arteritis, vasculitis mandible, general hepatitis, avascular necrosis bilat  . DDD (degenerative disc disease), lumbar   . Diabetes mellitus without complication ([place])   . Diverticulosis   . Duodenitis   . Gouty arthritis   . Hiatal hernia with GERD   . History of cardiovascular stress test    a. 07/2018 [facility]): Fixed inferoapical defect w/ nl contraction-->attenuation artifact. No ischemia. EF 63%.  . Hyperlipidemia   . Hypertension    a. 02/2019 Renal artery duplex: no evidence of prox RAS.  Marland Kitchen Hyperuricemia   . Keratitis sicca, bilateral ([place])   . Lymphedema of both lower extremities   . Osteoarthritis   . Pericarditis    Recurrent: Aug 97, July 05, Sept 08, July 16, July 18  . PUD (peptic ulcer disease)   . 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[place])   . Sjogren's syndrome ([place])   . Syncope   .  Tendonitis, Achilles, right   . Tortuous colon   . Trigger finger    Past Surgical History:  Procedure Laterality Date  . ABDOMINAL HYSTERECTOMY    . BREAST BIOPSY    . BREAST EXCISIONAL BIOPSY Left 1986   neg  . CHOLECYSTECTOMY    . COLONOSCOPY WITH PROPOFOL N/A 08/26/2018   Procedure: COLONOSCOPY WITH PROPOFOL;  Surgeon: Lucilla Lame, MD;  Location: [facility] ENDOSCOPY;  Service: Endoscopy;  Laterality: N/A;  . CYSTOSCOPY  02/26/2018   [name], MD   . distal arthrectomy    . ESOPHAGOGASTRODUODENOSCOPY (EGD) WITH PROPOFOL N/A 08/26/2018   Procedure: ESOPHAGOGASTRODUODENOSCOPY (EGD) WITH PROPOFOL;  Surgeon: Lucilla Lame, MD;  Location: [facility] ENDOSCOPY;  Service: Endoscopy;  Laterality: N/A;  . EXCISION NEUROMA    . KNEE SURGERY Bilateral    1985, 2001, 11/2002, 12/2006  . panhysterectomy  10/1983  . SHOULDER SURGERY Right    reverse total arthroplasty w/ biceps tenodesis  . TONSILLECTOMY AND ADENOIDECTOMY    . TOTAL HIP ARTHROPLASTY Right 04/2007  . WISDOM TOOTH EXTRACTION     Social History   Socioeconomic History  . Marital status: Divorced    Spouse name: Not on file  . Number of children: 0  . Years of education: Not on file  . Highest education level: Associate degree: academic program  Occupational History  . Occupation: Retired  Tobacco Use  . Smoking status: Former Smoker    Quit date: 01/22/1996    Years since quitting: 23.9  . Smokeless tobacco: Never Used  Vaping Use  . Vaping Use: Never used  Substance and Sexual Activity  . Alcohol use: Yes    Comment: occasional-once every 6 months  . Drug use: Never  . Sexual activity: Not on file  Other Topics Concern  . Not on file  Social History Narrative  . Not on file   Social Determinants of Health   Financial Resource Strain:   . Difficulty of Paying Living Expenses:   Food Insecurity:   . Worried About Charity fundraiser in the Last Year:   . Arboriculturist in the Last Year:   Transportation Needs:   . Film/video editor (Medical):   Marland Kitchen Lack of Transportation (Non-Medical):   Physical Activity:   . Days of Exercise per Week:   . Minutes of Exercise per Session:   Stress:   . Feeling of Stress :   Social Connections:   . Frequency of Communication with Friends and Family:   . Frequency of Social Gatherings with Friends and Family:   . Attends Religious Services:   . Active Member of Clubs or Organizations:   . Attends Theatre manager Meetings:   Marland Kitchen Marital Status:   Intimate Partner Violence:   . Fear of Current or Ex-Partner:   . Emotionally Abused:   Marland Kitchen Physically Abused:   . Sexually Abused:    Current Outpatient Medications on File Prior to Visit  Medication Sig Dispense Refill  . acetaminophen (TYLENOL) 500 MG tablet Take 500 mg by mouth daily as needed.    . ARTIFICIAL TEAR OP Apply to eye as needed.     . BD INSULIN SYRINGE U/F 31G X 5/16" 1 ML MISC USE AS DIRECTED. WITH HUMALOG 100 each 5  . BIOTIN PO Take by mouth daily.    . Cholecalciferol (VITAMIN D3) 25 MCG (1000 UT) CAPS Take 4 capsules by mouth daily.     . clindamycin (CLEOCIN) 300 MG capsule Take  600 mg by mouth. 1 Hour before dental procedures    . cloNIDine (CATAPRES) 0.1 MG tablet Take 1 tablet (0.1 mg) by mouth three times daily 270 tablet 3  . COLCRYS 0.6 MG tablet Take 1 tablet (0.6 mg total) by mouth 2 (two) times daily as needed. For up to 3 days for pericarditis, or up to 7 days for gout or connective tissue disorder. 60 tablet 2  . cyclobenzaprine (FLEXERIL) 5 MG tablet TAKE 1 TABLET (5 MG TOTAL) BY MOUTH ONCE DAILY AS NEEDED FOR MUSCLE SPASMS 30 tablet 2  . ezetimibe (ZETIA) 10 MG tablet Take 1 tablet (10 mg total) by mouth daily. 90 tablet 3  . famotidine (PEPCID) 20 MG tablet Take 20 mg by mouth 2 (two) times daily.    Marland Kitchen FREESTYLE LITE test strip USE AS DIRECTED THREE TIMES DAILY FOR DIABETES 100 strip 4  . insulin degludec (TRESIBA FLEXTOUCH) 200 UNIT/ML FlexTouch Pen Inject 56 Units into the skin daily. 3 pen 11  . insulin lispro (HUMALOG) 100 UNIT/ML injection Correction scale: take 1 unit per 50 over 150 before meals up to three times daily.  Up to 20 units per day. 20 mL 1  . Insulin Pen Needle (PEN NEEDLES) 32G X 5 MM MISC 1 Device by Does not apply route daily. 100 each 3  . lactase (LACTAID) 3000 units tablet Take by mouth as needed.     . Lancets (FREESTYLE) lancets Must test x4/day    . losartan (COZAAR) 100 MG tablet  Take 1 tablet (100 mg total) by mouth daily. 90 tablet 3  . nebivolol 20 MG TABS Take 1 tablet (20 mg total) by mouth daily. 90 tablet 3  . prednisoLONE acetate (PRED FORTE) 1 % ophthalmic suspension INSTILL ONE DROP  as needed    . rosuvastatin (CRESTOR) 10 MG tablet TAKE 1 TABLET BY MOUTH EVERY DAY 90 tablet 1   No current facility-administered medications on file prior to visit.   Allergies  Allergen Reactions  . Amlodipine     Other reaction(s): Unknown  . Aspirin     Other reaction(s): Unknown  . Bee Venom   . Ciprofloxacin     Other reaction(s): Other (see comments), Unknown  . Codeine     Other reaction(s): Unknown Must have pre medications before taking per patient report Includes all derivatives   . Erythromycin     Other reaction(s): Unknown, Unknown  . Glimepiride     Other reaction(s): Unknown  . Hydralazine     Other reaction(s): Unknown  . Hydrochlorothiazide     Other reaction(s): Dizziness or lightheadedness.  . Hydrocodone     Other reaction(s): Unknown Must have pre medications before taking per patient report  . Hydrocodone-Acetaminophen Nausea And Vomiting    MUST BE PREMEDICATED  . Hydromorphone Nausea And Vomiting    Other reaction(s): Unknown Must have pre medications before taking per patient report MUST BE PREMEDICATED   . Irbesartan     Other reaction(s): Unknown  . Irbesartan-Hydrochlorothiazide     Other reaction(s): Unknown  . Lisinopril     Other reaction(s): Unknown  . Maxitrol [Neomycin-Polymyxin-Dexameth]   . Metformin And Related   . Metformin Hcl     Other reaction(s): Unknown  . Methylcellulose     Other reaction(s): Unknown  . Metoclopramide Nausea And Vomiting    Other reaction(s): Unknown  . Metoprolol     Other reaction(s): Unknown  . Neomycin-Bacitracin Zn-Polymyx     Other reaction(s): Unknown  .  Norvasc [Amlodipine Besylate]   . Nsaids Other (See Comments)    Other reaction(s): Unknown bleeding   . Omeprazole  Magnesium     Other reaction(s): Unknown  . Other     Pt is allergic to staples and surgical metals  . Oxycodone-Acetaminophen     Other reaction(s): Unknown, Unknown Must have pre medications before taking per patient report   . Penicillins     Other reaction(s): Unknown, Unknown  . Pioglitazone     Other reaction(s): Unknown  . Poison [employer]     Poison [place] also  . Prilosec [Omeprazole]   . Silver Sulfadiazine     Other reaction(s): Unknown  . Sulfa Antibiotics     Other reaction(s): Unknown, Unknown  . Tape     Other reaction(s): Unknown  . Tetanus Toxoid Other (See Comments)  . Tramadol     Other reaction(s): Unknown Must have pre medications before taking per patient report  . Betadine [Povidone Iodine] Rash    Only topically when left on skin.   Family History  Problem Relation Age of Onset  . Hypertension Mother   . Diabetes Mother   . Hyperlipidemia Mother   . Congestive Heart Failure Mother   . Cancer Mother   . Non-Hodgkin's lymphoma Sister   . Cancer Sister   . Diabetes Maternal Grandmother   . Breast cancer Neg Hx    PE: BP (!) 142/80   Pulse 61   Ht 5\' 9"  (1.753 m)   Wt 238 lb (108 kg)   SpO2 99%   BMI 35.15 kg/m  Wt Readings from Last 3 Encounters:  12/28/19 238 lb (108 kg)  12/22/19 240 lb 2 oz (108.9 kg)  12/07/19 241 lb 6 oz (109.5 kg)   Constitutional: overweight, in NAD Eyes: PERRLA, EOMI, no exophthalmos ENT: moist mucous membranes, no thyromegaly, no nodules palpated in anterior neck,  no cervical lymphadenopathy Cardiovascular: RRR, No MRG, + significant pitting BLE edema (lymphedema) Respiratory: CTA B Gastrointestinal: abdomen soft, NT, ND, BS+ Musculoskeletal: no deformities, strength intact in all 4;  Skin: moist, warm, no rashes Neurological: no tremor with outstretched hands, DTR normal in all 4  ASSESSMENT: 1. Thyroid nodules  PLAN: 1. Thyroid nodules - I reviewed the report of her 2021 thyroid ultrasound  along with the patient (images not available). I pointed out that the dominant nodules are small and overall not worrisome.    The right superior nodule measures 0.9 cm in the largest dimension, it is complex (has a cystic component), and it is isoechoic.  There are no concerning features like increased blood flow, microcalcifications, irregular margins, or taller than wide distribution.  No follow-up is needed for this nodule.  The right inferior nodule is larger, at 1.5 cm in the largest dimension, it is also complex but it is hypoechoic.  Again, no other concerning features as mentioned above.  For this nodule, a repeat ultrasound is recommended in 1 year.  There is no need for biopsy at this point. Pt does not have a thyroid cancer family history or a personal history of RxTx to head/neck. All these would favor benignity.  -I explained that the vast majority of the thyroid nodules are benign especially in a low risk patient. -We discussed that. in general, thyroid cancer is a growing cancer with good outcomes in terms of life expectancy and quality of life.  Even if found later in the thyroid nodule, treatment is usually very efficient. -For now, we  will follow the right inferior nodule and may need biopsy if it changes ultrasound characteristics or increases in size. -She has no neck compression symptoms -We reviewed together her latest TSH which was normal.  We will not repeat this today -We will have her back in a year -we discussed that she can get in touch with me before hand so I can order her neck thyroid ultrasound and review the images along with her at the time of next visit  Philemon Kingdom, MD PhD [facility] Endocrinology

## 2019-12-30 ENCOUNTER — Other Ambulatory Visit: Payer: Self-pay | Admitting: Pharmacy Technician

## 2019-12-30 NOTE — Patient Outreach (Addendum)
[facility]) Care Management  12/30/2019  [name] 12/22/1945 [id number]  ERROR  Haizley Cannella P. Janziel Hockett, [place]  [phone]

## 2019-12-31 ENCOUNTER — Ambulatory Visit: Payer: Medicare Other | Admitting: Physical Therapy

## 2019-12-31 ENCOUNTER — Other Ambulatory Visit: Payer: Self-pay

## 2019-12-31 DIAGNOSIS — G8929 Other chronic pain: Secondary | ICD-10-CM | POA: Diagnosis not present

## 2019-12-31 DIAGNOSIS — M545 Low back pain, unspecified: Secondary | ICD-10-CM

## 2019-12-31 DIAGNOSIS — M6281 Muscle weakness (generalized): Secondary | ICD-10-CM | POA: Diagnosis not present

## 2020-01-01 ENCOUNTER — Telehealth: Payer: Self-pay | Admitting: Cardiovascular Disease

## 2020-01-01 DIAGNOSIS — I890 Lymphedema, not elsewhere classified: Secondary | ICD-10-CM

## 2020-01-01 NOTE — Telephone Encounter (Signed)
Patient is calling in stating she wanting to be referred to physical therapy for  lymphedema

## 2020-01-05 ENCOUNTER — Telehealth: Payer: Self-pay

## 2020-01-05 NOTE — Therapy (Addendum)
[place] [facility] [address] [place], [place], [address] Phone: [phone]   Fax:  [phone]  Physical Therapy Treatment  Patient Details  Name: [name] MRN: [id number] Date of Birth: Dec 02, 1945 Referring Provider (PT): Dr. Parks Ranger   Encounter Date: 12/31/2019   Treatment: 13 of 24.  Recert date: 69/09/8544 1649 to 13    Past Medical History:  Diagnosis Date  . Anterolisthesis    Cervical spine  . Asthma   . Connective tissue disorder (HCC)    recurrent carotid arteritis, temporal arteritis, vasculitis mandible, general hepatitis, avascular necrosis bilat  . DDD (degenerative disc disease), lumbar   . Diabetes mellitus without complication ([place])   . Diverticulosis   . Duodenitis   . Gouty arthritis   . Hiatal hernia with GERD   . History of cardiovascular stress test    a. 07/2018 [facility]): Fixed inferoapical defect w/ nl contraction-->attenuation artifact. No ischemia. EF 63%.  . Hyperlipidemia   . Hypertension    a. 02/2019 Renal artery duplex: no evidence of prox RAS.  Marland Kitchen Hyperuricemia   . Keratitis sicca, bilateral ([place])   . Lymphedema of both lower extremities   . Osteoarthritis   . Pericarditis    Recurrent: Aug 97, July 05, Sept 08, July 16, July 18  . PUD (peptic ulcer disease)   . 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[place])   . Sjogren's syndrome ([place])   . Syncope   . Tendonitis, Achilles, right   . Tortuous colon   . Trigger finger     Past Surgical History:  Procedure Laterality Date  . ABDOMINAL HYSTERECTOMY    . BREAST BIOPSY    . BREAST EXCISIONAL BIOPSY Left 1986   neg  . CHOLECYSTECTOMY    . COLONOSCOPY WITH PROPOFOL N/A 08/26/2018   Procedure: COLONOSCOPY WITH PROPOFOL;  Surgeon: Lucilla Lame, MD;  Location: [facility] ENDOSCOPY;  Service: Endoscopy;  Laterality: N/A;  . CYSTOSCOPY  02/26/2018   [name], MD   . distal arthrectomy    . ESOPHAGOGASTRODUODENOSCOPY (EGD) WITH PROPOFOL N/A  08/26/2018   Procedure: ESOPHAGOGASTRODUODENOSCOPY (EGD) WITH PROPOFOL;  Surgeon: Lucilla Lame, MD;  Location: [facility] ENDOSCOPY;  Service: Endoscopy;  Laterality: N/A;  . EXCISION NEUROMA    . KNEE SURGERY Bilateral    1985, 2001, 11/2002, 12/2006  . panhysterectomy  10/1983  . SHOULDER SURGERY Right    reverse total arthroplasty w/ biceps tenodesis  . TONSILLECTOMY AND ADENOIDECTOMY    . TOTAL HIP ARTHROPLASTY Right 04/2007  . WISDOM TOOTH EXTRACTION      There were no vitals filed for this visit.    BP: 132/78. Pt. states she has been active around house. PT discussed importance of daily walking/ HEP.       Manual tx:  Prone STM to B lumbar paraspinals/ sup. Glut./ hips.  Reassessment of spinal mobility Supine LE/ lumbar generalized stretching  There.ex.:  Supine hip abduction (RTB), marching (RTB) 20x each. Supine SLR 10x2, Seated LAQ and reassessment of hip flexion MMT Resisted gait 1BTB in //-bars Seated blue ball ex.: pelvic clocks/ marching/ heel raises       PT Long Term Goals - 12/25/19 1313      PT LONG TERM GOAL #1   Title Pt. will be independent with HEP to increase B hip/LE muscle strengthening 1/2 muscle grade to improve pain-free mobility/ upright posture.    Baseline Generalized B LE muscle weakness grossly 4/5 MMT (pain limited L hip/ R knee).  Time 8    Period Weeks    Status Not Met    Target Date 03/17/20      PT LONG TERM GOAL #2   Title Pt. will increase FOTO to 58 to improve pain-free mobility.    Baseline Initial FOTO: 53    Time 12    Period Weeks    Status On-going    Target Date 03/17/20      PT LONG TERM GOAL #3   Title Pt. will demonstrate/ maintain proper upright posture with standing tolerance with no increase c/o back pain to improve cooking.    Baseline Moderate rounded shoulders/ forward head posture.    Time 12    Period Weeks    Status Partially Met    Target Date 03/17/20      PT LONG TERM GOAL #4   Title Pt. will  report 4/10 low back pain at worst with no radicular symptoms to improve ADLs/ household chores.    Baseline Pt. reports no pain in back currently at rest but >6/10 pain with increase activity.    Time 12    Period Weeks    Status Partially Met    Target Date 03/17/20            Moderate hip flexor/ abductor muscle weakness noted during supine clamshells/marching with resistive bands. Cuing to increase step pattern/ upright posture during resisted ex. in //-bars. Pt. requires UE assist at //-bars during blue theraball ex. for safety/ balance.       Patient will benefit from skilled therapeutic intervention in order to improve the following deficits and impairments:  Abnormal gait, Pain, Improper body mechanics, Postural dysfunction, Decreased mobility, Decreased activity tolerance, Decreased endurance, Decreased range of motion, Decreased strength, Impaired UE functional use, Impaired flexibility, Difficulty walking  Visit Diagnosis: Chronic bilateral low back pain without sciatica  Muscle weakness (generalized)     Problem List Patient Active Problem List   Diagnosis Date Noted  . Multiple thyroid nodules 12/28/2019  . Thyroid nodule 09/29/2019  . Lymphedema 11/10/2018  . PAD (peripheral artery disease) ([place]) 10/12/2018  . Aortic atherosclerosis ([place]) 10/12/2018  . Carotid stenosis, asymptomatic, bilateral 10/12/2018  . Positive colorectal cancer screening using Cologuard test 10/09/2018  . Gastroesophageal reflux disease   . Acute gastritis without hemorrhage   . Osteoarthritis 04/29/2018  . Hiatal hernia with GERD 04/29/2018  . Hyperlipidemia 04/29/2018  . Lymphedema of both lower extremities 04/29/2018  . Left knee pain 10/23/2016  . Cataracts, bilateral 07/31/2016  . Glaucoma 07/31/2016  . Lateral epicondylitis of left elbow 10/20/2015  . Vitamin D deficiency 10/20/2015  . Type 2 diabetes, controlled, with neuropathy ([place]) 10/16/2015  . Gouty arthritis  06/03/2015  . H/O syncope 06/03/2015  . Mild intermittent asthma 06/03/2015  . Multiple gastric ulcers 06/03/2015  . Schatzki's ring 06/03/2015  . Undifferentiated connective tissue disease ([place]) 06/03/2015  . Bone disorder 04/05/2015  . Dental injury 04/05/2015  . Pericarditis 04/05/2015  . Sjogren's syndrome ([place]) 09/07/2014  . Elevated alkaline phosphatase level 08/24/2014  . Lactose intolerance 08/24/2014  . Obesity 08/24/2014  . Peripheral edema 08/24/2014  . 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 Abdominal adhesions 08/02/2014  . 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[place]) 08/02/2014  . Degenerative joint disease (DJD) of lumbar spine 08/02/2014  . Diverticulosis of both small and large intestine 08/02/2014  . Hyperuricemia 08/02/2014  . Pure hypercholesterolemia 08/02/2014  . Trigger finger 08/02/2014  . Right  shoulder pain 07/29/2014  . Benign neoplasm of colon 06/30/2010  . Right upper quadrant pain 06/30/2010   Pura Spice, PT, DPT # [id number] 01/05/2020, 1:37 PM  [place] [facility] [address] [place], [place], [address] Phone: [phone]   Fax:  [phone]  Name: Evgenia Merriman MRN: [id number] Date of Birth: 1945/07/21

## 2020-01-06 ENCOUNTER — Ambulatory Visit (INDEPENDENT_AMBULATORY_CARE_PROVIDER_SITE_OTHER): Payer: Medicare Other | Admitting: Pharmacist

## 2020-01-06 DIAGNOSIS — E1140 Type 2 diabetes mellitus with diabetic neuropathy, unspecified: Secondary | ICD-10-CM

## 2020-01-06 NOTE — Telephone Encounter (Signed)
Left voicemail message that I see she is seeing PT services and if she needs me to assist with anything further to please give me a call back.

## 2020-01-06 NOTE — Telephone Encounter (Signed)
Patient returning call.

## 2020-01-06 NOTE — Chronic Care Management (AMB) (Signed)
Chronic Care Management   Follow Up Note   01/06/2020 Name: [name] MRN: [id number] DOB: 12/23/1945  Referred by: Patricia Bangs, FNP Reason for referral : Chronic Care Management (Patient Phone Call) and Care Coordination ([place] Patient Assistance Program)   [name] is a 74 y.o. year old female who is a primary care patient of Patricia Bangs, FNP. The CCM team was consulted for assistance with chronic disease management and care coordination needs.    I reached out to [name] by phone today.   Place coordination of care call to [employer] patient assistance program.  Review of patient status, including review of consultants reports, relevant laboratory and other test results, and collaboration with appropriate care team members and the patient's provider was performed as part of comprehensive patient evaluation and provision of chronic care management services.     Outpatient Encounter Medications as of 01/06/2020  Medication Sig  . acetaminophen (TYLENOL) 500 MG tablet Take 500 mg by mouth daily as needed.  . ARTIFICIAL TEAR OP Apply to eye as needed.   . BD INSULIN SYRINGE U/F 31G X 5/16" 1 ML MISC USE AS DIRECTED. WITH HUMALOG  . BIOTIN PO Take by mouth daily.  . Cholecalciferol (VITAMIN D3) 25 MCG (1000 UT) CAPS Take 4 capsules by mouth daily.   . clindamycin (CLEOCIN) 300 MG capsule Take 600 mg by mouth. 1 Hour before dental procedures  . cloNIDine (CATAPRES) 0.1 MG tablet Take 1 tablet (0.1 mg) by mouth three times daily  . COLCRYS 0.6 MG tablet Take 1 tablet (0.6 mg total) by mouth 2 (two) times daily as needed. For up to 3 days for pericarditis, or up to 7 days for gout or connective tissue disorder.  . cyclobenzaprine (FLEXERIL) 5 MG tablet TAKE 1 TABLET (5 MG TOTAL) BY MOUTH ONCE DAILY AS NEEDED FOR MUSCLE SPASMS  . ezetimibe (ZETIA) 10 MG tablet Take 1 tablet (10 mg total) by mouth daily.  . famotidine (PEPCID) 20 MG tablet  Take 20 mg by mouth 2 (two) times daily.  Marland Kitchen FREESTYLE LITE test strip USE AS DIRECTED THREE TIMES DAILY FOR DIABETES  . insulin degludec (TRESIBA FLEXTOUCH) 200 UNIT/ML FlexTouch Pen Inject 56 Units into the skin daily.  . insulin lispro (HUMALOG) 100 UNIT/ML injection Correction scale: take 1 unit per 50 over 150 before meals up to three times daily.  Up to 20 units per day.  . Insulin Pen Needle (PEN NEEDLES) 32G X 5 MM MISC 1 Device by Does not apply route daily.  Marland Kitchen lactase (LACTAID) 3000 units tablet Take by mouth as needed.   . Lancets (FREESTYLE) lancets Must test x4/day  . losartan (COZAAR) 100 MG tablet Take 1 tablet (100 mg total) by mouth daily.  . nebivolol 20 MG TABS Take 1 tablet (20 mg total) by mouth daily.  . prednisoLONE acetate (PRED FORTE) 1 % ophthalmic suspension INSTILL ONE DROP  as needed  . rosuvastatin (CRESTOR) 10 MG tablet TAKE 1 TABLET BY MOUTH EVERY DAY   No facility-administered encounter medications on file as of 01/06/2020.    Goals Addressed              This Visit's Progress   .  PharmD - Medication Assistance (pt-stated)        Current Barriers:  . Financial Barriers: patient has [place] [employer] Preferred Part D insurance and reports copay for Tresiba, Humalog, Bystolic and Colcrys is cost prohibitive at this time  o Patient currently enrolled in patient assistance for Tresiba, Humalog and Colcrys for 2021 calendar year  Pharmacist Clinical Goal(s):  Marland Kitchen Over the next 30 days, patient will work with PharmD and providers to relieve medication access concerns  Interventions: . Leave a message for patient requesting a call back. . Receive a voicemail message from patient stating that she is going to call Cardiologist today regarding worsening edema. Also lets me know that she has not yet received refill of Tresiba from [employer] patient assistance program . Follow up with patient and encourage her to follow up with Cardiologist today. Reports she  has chronic swelling in her feet and ankles and that this swelling is worse recently. States that she will call today for advice. . Follow up regarding Tresiba patient assistance.  o Note on 6/30 patient reported that she had called [place] [facility] for status of refill and was notified that medication will be sent out within next 10 days. o Assisted patient at that time with obtaining refill of Tresiba through [facility] program to ensure she had supply to last until this refill arrived. o Today reports that she is currently using her last pen from this Immediate Supply program and refill from patient assistance program has still not arrived. Denies having followed up with assistance program again. - Advise patient to call assistance program again today to follow up about shipment of refill . Place coordination of care call to [employer] regarding Tresiba refill. Speak with [name] who confirms 4 month supply shipped to office. . Follow up with CMA [name] with office who confirms supply received. . Follow up with [name] to let her know that she can pick up supply of Tresiba from patient assistance program from office. . Follow up with patient regarding COVID-19 prevention and vaccination. Encourage patient to contact local pharmacy to schedule vaccination.  Patient Self Care Activities:  . Patient to contact providers for new medical questions/concerns . Patient to attend scheduled medical appointments  Please see past updates related to this goal by clicking on the "Past Updates" button in the selected goal         Plan  Telephone follow up appointment with care management team member scheduled for: 9/29 at 71 am  [name], PharmD, [place] [phone]

## 2020-01-06 NOTE — Patient Instructions (Signed)
Thank you allowing the Chronic Care Management Team to be a part of your care! It was a pleasure speaking with you today!     CCM (Chronic Care Management) Team    Noreene Larsson RN, MSN, CCM Nurse Care Coordinator  [phone]   Harlow Asa PharmD  Clinical Pharmacist  [phone]   Eula Fried LCSW Clinical Social Worker [phone]  Visit Information  Goals Addressed              This Visit's Progress     PharmD - Medication Assistance (pt-stated)        Current Barriers:   Financial Barriers: patient has [place] [employer] Preferred Part D insurance and reports copay for [name], Humalog, Bystolic and Colcrys is cost prohibitive at this time o Patient currently enrolled in patient assistance for [name], Humalog and Colcrys for 2021 calendar year  Pharmacist Clinical Goal(s):   Over the next 30 days, patient will work with PharmD and providers to relieve medication access concerns  Interventions:  Leave a message for patient requesting a call back.  Receive a voicemail message from patient stating that she is going to call Cardiologist today regarding worsening edema. Also lets me know that she has not yet received refill of Tresiba from [employer] patient assistance program  Follow up with patient and encourage her to follow up with Cardiologist today. Reports she has chronic swelling in her feet and ankles and that this swelling is worse recently. States that she will call today for advice.  Follow up regarding Tresiba patient assistance.  o Note on 6/30 patient reported that she had called [facility] for status of refill and was notified that medication will be sent out within next 10 days. o Assisted patient at that time with obtaining refill of Tresiba through [facility] program to ensure she had supply to last until this refill arrived. o Today reports that she is currently using her last pen from this Immediate Supply program  and refill from patient assistance program has still not arrived. Denies having followed up with assistance program again. - Advise patient to call assistance program again today to follow up about shipment of refill  Place coordination of care call to [employer] regarding Tresiba refill. Speak with Dennis Bast who confirms 4 month supply shipped to office.  Follow up with CMA Kelita with office who confirms supply received.  Follow up with Ms. Farooq to let her know that she can pick up supply of Tresiba from patient assistance program from office.  Follow up with patient regarding COVID-19 prevention and vaccination. Encourage patient to contact local pharmacy to schedule vaccination.  Patient Self Care Activities:   Patient to contact providers for new medical questions/concerns  Patient to attend scheduled medical appointments  Please see past updates related to this goal by clicking on the "Past Updates" button in the selected goal         Patient verbalizes understanding of instructions provided today.   Telephone follow up appointment with care management team member scheduled for: 9/29 at 42 am  Harlow Asa, PharmD, [place] [phone]

## 2020-01-06 NOTE — Telephone Encounter (Signed)
Spoke with Benjamine Mola and she informed me that she will notify the patient that her medications have arrived and is available for pick up.

## 2020-01-06 NOTE — Telephone Encounter (Signed)
Spoke with patient and she reports that swelling has not improved. She inquired if we could please put in referral for her to see Clarene Critchley for lymphedema therapy. Advised that I would check with provider and place order for her. She verbalized understanding with no further questions.

## 2020-01-11 NOTE — Telephone Encounter (Signed)
Yes  thx

## 2020-01-14 ENCOUNTER — Other Ambulatory Visit: Payer: Self-pay | Admitting: Family Medicine

## 2020-01-14 ENCOUNTER — Other Ambulatory Visit: Payer: Self-pay

## 2020-01-14 ENCOUNTER — Ambulatory Visit: Payer: Medicare Other | Attending: Family Medicine | Admitting: Physical Therapy

## 2020-01-14 DIAGNOSIS — M545 Low back pain: Secondary | ICD-10-CM | POA: Insufficient documentation

## 2020-01-14 DIAGNOSIS — I890 Lymphedema, not elsewhere classified: Secondary | ICD-10-CM | POA: Diagnosis not present

## 2020-01-14 DIAGNOSIS — M6281 Muscle weakness (generalized): Secondary | ICD-10-CM | POA: Diagnosis not present

## 2020-01-14 DIAGNOSIS — G8929 Other chronic pain: Secondary | ICD-10-CM | POA: Insufficient documentation

## 2020-01-14 DIAGNOSIS — I319 Disease of pericardium, unspecified: Secondary | ICD-10-CM

## 2020-01-14 DIAGNOSIS — M109 Gout, unspecified: Secondary | ICD-10-CM

## 2020-01-14 MED ORDER — COLCRYS 0.6 MG PO TABS: 0.6000 mg | ORAL_TABLET | Freq: Two times a day (BID) | ORAL | 2 refills | Status: AC | PRN

## 2020-01-14 NOTE — Telephone Encounter (Signed)
colchicine 0.6 MG tablet [[id number]]     Patient states she is having a gout flair up. She is requesting refill.    Pharmacy:  [place] [place] [facility] Phone:  [phone]  Fax:  [phone]

## 2020-01-15 ENCOUNTER — Ambulatory Visit (INDEPENDENT_AMBULATORY_CARE_PROVIDER_SITE_OTHER): Payer: Medicare Other | Admitting: Pharmacist

## 2020-01-15 ENCOUNTER — Telehealth: Payer: Self-pay | Admitting: Cardiovascular Disease

## 2020-01-15 DIAGNOSIS — M109 Gout, unspecified: Secondary | ICD-10-CM

## 2020-01-15 DIAGNOSIS — I129 Hypertensive chronic kidney disease with stage 1 through stage 4 chronic kidney disease, or unspecified chronic kidney disease: Secondary | ICD-10-CM | POA: Diagnosis not present

## 2020-01-15 DIAGNOSIS — N183 Chronic kidney disease, stage 3 unspecified: Secondary | ICD-10-CM | POA: Diagnosis not present

## 2020-01-15 NOTE — Telephone Encounter (Signed)
Patient calling in regarding some questions about physical therapy she would like to discuss with nurse. Patient also believes that she may have gout in her foot due to medications  Please advise

## 2020-01-15 NOTE — Telephone Encounter (Signed)
Spoke with patient and reviewed that order was placed and it can take 2 weeks for them to process referral with her insurance. She did talk about possible gout and advised she should need to call her primary about those concerns. She was appreciative for the call back with no further questions at this time.

## 2020-01-15 NOTE — Telephone Encounter (Signed)
Left detailed voicemail message that order has been entered for OT services for her lymphedema and to call back if any further questions.

## 2020-01-15 NOTE — Patient Instructions (Signed)
Thank you allowing the Chronic Care Management Team to be a part of your care! It was a pleasure speaking with you today!     CCM (Chronic Care Management) Team    Noreene Larsson RN, MSN, CCM Nurse Care Coordinator  [phone]   Harlow Asa PharmD  Clinical Pharmacist  [phone]   Eula Fried LCSW Clinical Social Worker [phone]  Visit Information  Goals Addressed              This Visit's Progress     PharmD - Medication Assistance (pt-stated)        Current Barriers:   Financial Barriers: patient has [place] [employer] Preferred Part D insurance and reports copay for [name], Humalog, Bystolic and Colcrys is cost prohibitive at this time o Patient currently enrolled in patient assistance for [name], Humalog and Colcrys for 2021 calendar year  Pharmacist Clinical Goal(s):   Over the next 30 days, patient will work with PharmD and providers to relieve medication access concerns  Interventions:  Receive voicemail from patient requesting assistance with Colcrys Rx.  o Reports she is currently having a gout flare and needing colchicine Rx filled today o States Rx sent in for brand name and is unaffordable  Coordination of care call to [place] confirms spoke with office and received approval to fill Colcrys Rx for generic  Follow up call to patient regarding colchicine Rx.   Counsel patient on [employer] for medication assistance for colchicine. Note [place] Gout - Medicare Access fund is currently open. o Provide patient with phone number to call to apply  Discuss with patient risk factors for gout flares  Patient Self Care Activities:   Patient to contact providers for new medical questions/concerns  Patient to attend scheduled medical appointments  Please see past updates related to this goal by clicking on the "Past Updates" button in the selected goal         Patient verbalizes understanding  of instructions provided today.   Telephone follow up appointment with care management team member scheduled for: 9/29 at 79 am   Harlow Asa, PharmD, [place] [phone]

## 2020-01-15 NOTE — Chronic Care Management (AMB) (Signed)
Chronic Care Management   Follow Up Note   01/15/2020 Name: Jamyia Fortune MRN: [id number] DOB: December 11, 1945  Referred by: Verl Bangs, FNP Reason for referral : Chronic Care Management (Patient Phone Call)   Eveleigh Crumpler is a 74 y.o. year old female who is a primary care patient of [name], [name], [place]. The CCM team was consulted for assistance with chronic disease management and care coordination needs.    I reached out to [name] by phone today.   Coordination of care call to [facility]  Review of patient status, including review of consultants reports, relevant laboratory and other test results, and collaboration with appropriate care team members and the patient's provider was performed as part of comprehensive patient evaluation and provision of chronic care management services.     Outpatient Encounter Medications as of 01/15/2020  Medication Sig  . acetaminophen (TYLENOL) 500 MG tablet Take 500 mg by mouth daily as needed.  . ARTIFICIAL TEAR OP Apply to eye as needed.   . BD INSULIN SYRINGE U/F 31G X 5/16" 1 ML MISC USE AS DIRECTED. WITH HUMALOG  . BIOTIN PO Take by mouth daily.  . Cholecalciferol (VITAMIN D3) 25 MCG (1000 UT) CAPS Take 4 capsules by mouth daily.   . clindamycin (CLEOCIN) 300 MG capsule Take 600 mg by mouth. 1 Hour before dental procedures  . cloNIDine (CATAPRES) 0.1 MG tablet Take 1 tablet (0.1 mg) by mouth three times daily  . COLCRYS 0.6 MG tablet Take 1 tablet (0.6 mg total) by mouth 2 (two) times daily as needed. For up to 3 days for pericarditis, or up to 7 days for gout or connective tissue disorder.  . cyclobenzaprine (FLEXERIL) 5 MG tablet TAKE 1 TABLET (5 MG TOTAL) BY MOUTH ONCE DAILY AS NEEDED FOR MUSCLE SPASMS  . ezetimibe (ZETIA) 10 MG tablet Take 1 tablet (10 mg total) by mouth daily.  . famotidine (PEPCID) 20 MG tablet Take 20 mg by mouth 2 (two) times daily.  Marland Kitchen FREESTYLE LITE test strip USE AS DIRECTED THREE  TIMES DAILY FOR DIABETES  . insulin degludec (TRESIBA FLEXTOUCH) 200 UNIT/ML FlexTouch Pen Inject 56 Units into the skin daily.  . insulin lispro (HUMALOG) 100 UNIT/ML injection Correction scale: take 1 unit per 50 over 150 before meals up to three times daily.  Up to 20 units per day.  . Insulin Pen Needle (PEN NEEDLES) 32G X 5 MM MISC 1 Device by Does not apply route daily.  Marland Kitchen lactase (LACTAID) 3000 units tablet Take by mouth as needed.   . Lancets (FREESTYLE) lancets Must test x4/day  . losartan (COZAAR) 100 MG tablet Take 1 tablet (100 mg total) by mouth daily.  . nebivolol 20 MG TABS Take 1 tablet (20 mg total) by mouth daily.  . prednisoLONE acetate (PRED FORTE) 1 % ophthalmic suspension INSTILL ONE DROP  as needed  . rosuvastatin (CRESTOR) 10 MG tablet TAKE 1 TABLET BY MOUTH EVERY DAY   No facility-administered encounter medications on file as of 01/15/2020.    Goals Addressed              This Visit's Progress   .  PharmD - Medication Assistance (pt-stated)        Current Barriers:  . Financial Barriers: patient has [place] [employer] Preferred Part D insurance and reports copay for Tresiba, Humalog, Bystolic and Colcrys is cost prohibitive at this time o Patient currently enrolled in patient assistance for Tresiba, Humalog and  Colcrys for 2021 calendar year  Pharmacist Clinical Goal(s):  Marland Kitchen Over the next 30 days, patient will work with PharmD and providers to relieve medication access concerns  Interventions: . Receive voicemail from patient requesting assistance with Colcrys Rx.  o Reports she is currently having a gout flare and needing colchicine Rx filled today o States Rx sent in for brand name and is unaffordable . Coordination of care call to [place] confirms spoke with office and received approval to fill Colcrys Rx for generic . Follow up call to patient regarding colchicine Rx.  . Counsel patient on [facility] for medication  assistance for colchicine. Note [place] [facility] - Medicare Access fund is currently open. o Provide patient with phone number to call to apply . Discuss with patient risk factors for gout flares  Patient Self Care Activities:  . Patient to contact providers for new medical questions/concerns . Patient to attend scheduled medical appointments  Please see past updates related to this goal by clicking on the "Past Updates" button in the selected goal         Plan  Telephone follow up appointment with care management team member scheduled for: 9/29 at 63 am  Harlow Asa, PharmD, [place] [phone]

## 2020-01-15 NOTE — Addendum Note (Signed)
Addended by: Valora Corporal on: 01/15/2020 08:02 AM   Modules accepted: Orders

## 2020-01-21 ENCOUNTER — Ambulatory Visit: Payer: Medicare Other | Admitting: Physical Therapy

## 2020-01-21 ENCOUNTER — Other Ambulatory Visit: Payer: Self-pay

## 2020-01-21 DIAGNOSIS — M6281 Muscle weakness (generalized): Secondary | ICD-10-CM

## 2020-01-21 DIAGNOSIS — G8929 Other chronic pain: Secondary | ICD-10-CM | POA: Diagnosis not present

## 2020-01-21 DIAGNOSIS — M545 Low back pain, unspecified: Secondary | ICD-10-CM

## 2020-01-21 DIAGNOSIS — I890 Lymphedema, not elsewhere classified: Secondary | ICD-10-CM | POA: Diagnosis not present

## 2020-01-21 NOTE — Therapy (Addendum)
[place] [facility] [address] [place], [place], [address] Phone: [phone]   Fax:  [phone]  Physical Therapy Treatment  Patient Details  Name: [name] MRN: [id number] Date of Birth: Feb 19, 1946 Referring Provider (PT): Dr. Parks Ranger   Encounter Date: 01/21/2020    Treatment: 15 of 24.  Recert date: 16/0/7371 1653 to [place]   Past Medical History:  Diagnosis Date  . Anterolisthesis    Cervical spine  . Asthma   . Connective tissue disorder (HCC)    recurrent carotid arteritis, temporal arteritis, vasculitis mandible, general hepatitis, avascular necrosis bilat  . DDD (degenerative disc disease), lumbar   . Diabetes mellitus without complication ([place])   . Diverticulosis   . Duodenitis   . Gouty arthritis   . Hiatal hernia with GERD   . History of cardiovascular stress test    a. 07/2018 [facility]): Fixed inferoapical defect w/ nl contraction-->attenuation artifact. No ischemia. EF 63%.  . Hyperlipidemia   . Hypertension    a. 02/2019 Renal artery duplex: no evidence of prox RAS.  Marland Kitchen Hyperuricemia   . Keratitis sicca, bilateral ([place])   . Lymphedema of both lower extremities   . Osteoarthritis   . Pericarditis    Recurrent: Aug 97, July 05, Sept 08, July 16, July 18  . PUD (peptic ulcer disease)   . 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[place])   . Sjogren's syndrome ([place])   . Syncope   . Tendonitis, Achilles, right   . Tortuous colon   . Trigger finger     Past Surgical History:  Procedure Laterality Date  . ABDOMINAL HYSTERECTOMY    . BREAST BIOPSY    . BREAST EXCISIONAL BIOPSY Left 1986   neg  . CHOLECYSTECTOMY    . COLONOSCOPY WITH PROPOFOL N/A 08/26/2018   Procedure: COLONOSCOPY WITH PROPOFOL;  Surgeon: Lucilla Lame, MD;  Location: [facility] ENDOSCOPY;  Service: Endoscopy;  Laterality: N/A;  . CYSTOSCOPY  02/26/2018   [name], MD   . distal arthrectomy    . ESOPHAGOGASTRODUODENOSCOPY (EGD) WITH PROPOFOL N/A  08/26/2018   Procedure: ESOPHAGOGASTRODUODENOSCOPY (EGD) WITH PROPOFOL;  Surgeon: Lucilla Lame, MD;  Location: [facility] ENDOSCOPY;  Service: Endoscopy;  Laterality: N/A;  . EXCISION NEUROMA    . KNEE SURGERY Bilateral    1985, 2001, 11/2002, 12/2006  . panhysterectomy  10/1983  . SHOULDER SURGERY Right    reverse total arthroplasty w/ biceps tenodesis  . TONSILLECTOMY AND ADENOIDECTOMY    . TOTAL HIP ARTHROPLASTY Right 04/2007  . WISDOM TOOTH EXTRACTION      There were no vitals filed for this visit.    Pt. states she is scheduled to start OT in a couple weeks to address LE lymphadema at [facility]. Pt. states she has been somewhat active today. BP: 144/88. Pts. BP has been more under control over past several weeks allowing PT to incorporate more ther.ex. No Nustep due to knee issues/ pain.        Tx. Time/billing limited but Medicare standards.  There.ex.:  Standing hip flexion/ abduction (lateral walking)/ hip extension 20x at //-bars with mirror feedback Supine SLR 10x2/ clamshell with RTB (unilateral/ bilateral) 20x.   Bridging 20x (fatigue)- no increase c/o back pain Reviewed HEP (PT will issue new HEP next visit)  Manual txl  Supine hamstring stretches 3x on L/R (added gentle ankle pumps/ glides as tolerated) Supine/ seated trunk rotn with light overpressure Seated posture correction/ STM to paraspinals       PT Long  Term Goals - 12/25/19 1313      PT LONG TERM GOAL #1   Title Pt. will be independent with HEP to increase B hip/LE muscle strengthening 1/2 muscle grade to improve pain-free mobility/ upright posture.    Baseline Generalized B LE muscle weakness grossly 4/5 MMT (pain limited L hip/ R knee).    Time 8    Period Weeks    Status Not Met    Target Date 03/17/20      PT LONG TERM GOAL #2   Title Pt. will increase FOTO to 58 to improve pain-free mobility.    Baseline Initial FOTO: 53    Time 12    Period Weeks    Status On-going    Target Date 03/17/20       PT LONG TERM GOAL #3   Title Pt. will demonstrate/ maintain proper upright posture with standing tolerance with no increase c/o back pain to improve cooking.    Baseline Moderate rounded shoulders/ forward head posture.    Time 12    Period Weeks    Status Partially Met    Target Date 03/17/20      PT LONG TERM GOAL #4   Title Pt. will report 4/10 low back pain at worst with no radicular symptoms to improve ADLs/ household chores.    Baseline Pt. reports no pain in back currently at rest but >6/10 pain with increase activity.    Time 12    Period Weeks    Status Partially Met    Target Date 03/17/20            Pt. remains limited with chronic low back and lower leg pain with daily functional tasks. PT has been focusing on more strengthening/ endurance tasks to improve moblity/ safety. Pt. is planning to attend OT soon for lymphadema management. Generalized B hip/low back muscle weakness but improved tolerance with resisted ther.ex. Pt. limited by shoulder/ knee issues and unable to complete Nustep. PT recommends daily walking to improve muscle strengthening/ endurance. Generalized B hip/ITB/ low thoracic/lumbar paraspinal muscle tenderness with light palpation.       Patient will benefit from skilled therapeutic intervention in order to improve the following deficits and impairments:  Abnormal gait, Pain, Improper body mechanics, Postural dysfunction, Decreased mobility, Decreased activity tolerance, Decreased endurance, Decreased range of motion, Decreased strength, Impaired UE functional use, Impaired flexibility, Difficulty walking  Visit Diagnosis: Chronic bilateral low back pain without sciatica  Muscle weakness (generalized)     Problem List Patient Active Problem List   Diagnosis Date Noted  . Multiple thyroid nodules 12/28/2019  . Thyroid nodule 09/29/2019  . Lymphedema 11/10/2018  . PAD (peripheral artery disease) ([place]) 10/12/2018  . Aortic atherosclerosis  ([place]) 10/12/2018  . Carotid stenosis, asymptomatic, bilateral 10/12/2018  . Positive colorectal cancer screening using Cologuard test 10/09/2018  . Gastroesophageal reflux disease   . Acute gastritis without hemorrhage   . Osteoarthritis 04/29/2018  . Hiatal hernia with GERD 04/29/2018  . Hyperlipidemia 04/29/2018  . Lymphedema of both lower extremities 04/29/2018  . Left knee pain 10/23/2016  . Cataracts, bilateral 07/31/2016  . Glaucoma 07/31/2016  . Lateral epicondylitis of left elbow 10/20/2015  . Vitamin D deficiency 10/20/2015  . Type 2 diabetes, controlled, with neuropathy ([place]) 10/16/2015  . Gouty arthritis 06/03/2015  . H/O syncope 06/03/2015  . Mild intermittent asthma 06/03/2015  . Multiple gastric ulcers 06/03/2015  . Schatzki's ring 06/03/2015  . Undifferentiated connective tissue disease ([place]) 06/03/2015  . Bone  disorder 04/05/2015  . Dental injury 04/05/2015  . Pericarditis 04/05/2015  . Sjogren's syndrome ([place]) 09/07/2014  . Elevated alkaline phosphatase level 08/24/2014  . Lactose intolerance 08/24/2014  . Obesity 08/24/2014  . Peripheral edema 08/24/2014  . 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 Abdominal adhesions 08/02/2014  . 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[place]) 08/02/2014  . Degenerative joint disease (DJD) of lumbar spine 08/02/2014  . Diverticulosis of both small and large intestine 08/02/2014  . Hyperuricemia 08/02/2014  . Pure hypercholesterolemia 08/02/2014  . Trigger finger 08/02/2014  . Right shoulder pain 07/29/2014  . Benign neoplasm of colon 06/30/2010  . Right upper quadrant pain 06/30/2010   Pura Spice, PT, DPT # [phone] 01/26/2020,  2:08 PM  [place] [facility] [address] [place], [place], [address] Phone: [phone]   Fax:  [phone]  Name: Shane Melby MRN: [id number] Date of Birth: [birth year]

## 2020-01-21 NOTE — Therapy (Addendum)
[place] [facility] [address] [place], [place], [address] Phone: [phone]   Fax:  [phone]  Physical Therapy Treatment  Patient Details  Name: [name] MRN: [id number] Date of Birth: 1946-05-22 Referring Provider (PT): Dr. Parks Ranger   Encounter Date: 01/14/2020  Treatment: 14 of 24.  Recert date: 70/11/2374 1647 to 1750   Past Medical History:  Diagnosis Date  . Anterolisthesis    Cervical spine  . Asthma   . Connective tissue disorder (HCC)    recurrent carotid arteritis, temporal arteritis, vasculitis mandible, general hepatitis, avascular necrosis bilat  . DDD (degenerative disc disease), lumbar   . Diabetes mellitus without complication ([place])   . Diverticulosis   . Duodenitis   . Gouty arthritis   . Hiatal hernia with GERD   . History of cardiovascular stress test    a. 07/2018 [facility]): Fixed inferoapical defect w/ nl contraction-->attenuation artifact. No ischemia. EF 63%.  . Hyperlipidemia   . Hypertension    a. 02/2019 Renal artery duplex: no evidence of prox RAS.  Marland Kitchen Hyperuricemia   . Keratitis sicca, bilateral ([place])   . Lymphedema of both lower extremities   . Osteoarthritis   . Pericarditis    Recurrent: Aug 97, July 05, Sept 08, July 16, July 18  . PUD (peptic ulcer disease)   . 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[place])   . Sjogren's syndrome ([place])   . Syncope   . Tendonitis, Achilles, right   . Tortuous colon   . Trigger finger     Past Surgical History:  Procedure Laterality Date  . ABDOMINAL HYSTERECTOMY    . BREAST BIOPSY    . BREAST EXCISIONAL BIOPSY Left 1986   neg  . CHOLECYSTECTOMY    . COLONOSCOPY WITH PROPOFOL N/A 08/26/2018   Procedure: COLONOSCOPY WITH PROPOFOL;  Surgeon: Lucilla Lame, MD;  Location: [facility] ENDOSCOPY;  Service: Endoscopy;  Laterality: N/A;  . CYSTOSCOPY  02/26/2018   [name], MD   . distal arthrectomy    . ESOPHAGOGASTRODUODENOSCOPY (EGD) WITH PROPOFOL N/A 08/26/2018    Procedure: ESOPHAGOGASTRODUODENOSCOPY (EGD) WITH PROPOFOL;  Surgeon: Lucilla Lame, MD;  Location: [facility] ENDOSCOPY;  Service: Endoscopy;  Laterality: N/A;  . EXCISION NEUROMA    . KNEE SURGERY Bilateral    1985, 2001, 11/2002, 12/2006  . panhysterectomy  10/1983  . SHOULDER SURGERY Right    reverse total arthroplasty w/ biceps tenodesis  . TONSILLECTOMY AND ADENOIDECTOMY    . TOTAL HIP ARTHROPLASTY Right 04/2007  . WISDOM TOOTH EXTRACTION      There were no vitals filed for this visit.    No PT tx. last week due to scheduling issues. Pt. enters PT today with c/o consistent back pain. Pt. reports occasional muscle spasms. No new complaints.         Manual tx.  Prone/ sidelying position STM (addition of Hypervolt unit)- as tolerated.  Pt. Has generalized tenderness at multiple locations  There.ex.:  Walking in hallway with cuing for arm swing/ posture correction/ step pattern Standing RTB: core ex. With walk outs/ scap. Retraction/ bicep curls Standing marching/ lateral walking (mirror feedback) Sit to stands.       PT Long Term Goals - 12/25/19 1313      PT LONG TERM GOAL #1   Title Pt. will be independent with HEP to increase B hip/LE muscle strengthening 1/2 muscle grade to improve pain-free mobility/ upright posture.    Baseline Generalized B LE muscle weakness grossly 4/5 MMT (pain limited  L hip/ R knee).    Time 8    Period Weeks    Status Not Met    Target Date 03/17/20      PT LONG TERM GOAL #2   Title Pt. will increase FOTO to 58 to improve pain-free mobility.    Baseline Initial FOTO: 53    Time 12    Period Weeks    Status On-going    Target Date 03/17/20      PT LONG TERM GOAL #3   Title Pt. will demonstrate/ maintain proper upright posture with standing tolerance with no increase c/o back pain to improve cooking.    Baseline Moderate rounded shoulders/ forward head posture.    Time 12    Period Weeks    Status Partially Met    Target Date  03/17/20      PT LONG TERM GOAL #4   Title Pt. will report 4/10 low back pain at worst with no radicular symptoms to improve ADLs/ household chores.    Baseline Pt. reports no pain in back currently at rest but >6/10 pain with increase activity.    Time 12    Period Weeks    Status Partially Met    Target Date 03/17/20            Pt. reports slight increase in low back discomfort with position changes on mat table and extra time to get into prone position. Good hamstring flexibility with no regression in gains noted over past 2 weeks. Significant B lower leg/ feet swelling due to lymphadema. Limited use of compression unit and pt. would benefit from skilled PT/ wraping techniques. No increase c/o pain at end of tx. session and pt. is ambulating with more normalized gait pattern.       Patient will benefit from skilled therapeutic intervention in order to improve the following deficits and impairments:  Abnormal gait, Pain, Improper body mechanics, Postural dysfunction, Decreased mobility, Decreased activity tolerance, Decreased endurance, Decreased range of motion, Decreased strength, Impaired UE functional use, Impaired flexibility, Difficulty walking  Visit Diagnosis: Chronic bilateral low back pain without sciatica  Muscle weakness (generalized)     Problem List Patient Active Problem List   Diagnosis Date Noted  . Multiple thyroid nodules 12/28/2019  . Thyroid nodule 09/29/2019  . Lymphedema 11/10/2018  . PAD (peripheral artery disease) ([place]) 10/12/2018  . Aortic atherosclerosis ([place]) 10/12/2018  . Carotid stenosis, asymptomatic, bilateral 10/12/2018  . Positive colorectal cancer screening using Cologuard test 10/09/2018  . Gastroesophageal reflux disease   . Acute gastritis without hemorrhage   . Osteoarthritis 04/29/2018  . Hiatal hernia with GERD 04/29/2018  . Hyperlipidemia 04/29/2018  . Lymphedema of both lower extremities 04/29/2018  . Left knee pain  10/23/2016  . Cataracts, bilateral 07/31/2016  . Glaucoma 07/31/2016  . Lateral epicondylitis of left elbow 10/20/2015  . Vitamin D deficiency 10/20/2015  . Type 2 diabetes, controlled, with neuropathy ([place]) 10/16/2015  . Gouty arthritis 06/03/2015  . H/O syncope 06/03/2015  . Mild intermittent asthma 06/03/2015  . Multiple gastric ulcers 06/03/2015  . Schatzki's ring 06/03/2015  . Undifferentiated connective tissue disease ([place]) 06/03/2015  . Bone disorder 04/05/2015  . Dental injury 04/05/2015  . Pericarditis 04/05/2015  . Sjogren's syndrome ([place]) 09/07/2014  . Elevated alkaline phosphatase level 08/24/2014  . Lactose intolerance 08/24/2014  . Obesity 08/24/2014  . Peripheral edema 08/24/2014  . 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 Abdominal adhesions 08/02/2014  . Avascular  necrosis of bone of left hip ([place]) 08/02/2014  . Degenerative joint disease (DJD) of lumbar spine 08/02/2014  . Diverticulosis of both small and large intestine 08/02/2014  . Hyperuricemia 08/02/2014  . Pure hypercholesterolemia 08/02/2014  . Trigger finger 08/02/2014  . Right shoulder pain 07/29/2014  . Benign neoplasm of colon 06/30/2010  . Right upper quadrant pain 06/30/2010   Pura Spice, PT, DPT # [phone] 01/20/2020, 1:52 PM  [place] [facility] [address] [place], [place], [address] Phone: [phone]   Fax:  [phone]  Name: [name] MRN: [id number] Date of Birth: [birth year]

## 2020-01-27 ENCOUNTER — Telehealth: Payer: Self-pay

## 2020-01-27 NOTE — Telephone Encounter (Signed)
Copied from [place] [id number]. Topic: General - Other >> Jan 27, 2020 11:31 AM Lennox Solders wrote: Reason for CRM:pt is requesting to speak with carletta concerning medication. Pt may be using [facility] home delivery  Attempted to f/u with patient concerning medication concerns. Please find out what is her medication concerns.

## 2020-01-28 NOTE — Telephone Encounter (Signed)
Left VM for pt to CB regarding med concerns.

## 2020-01-28 NOTE — Telephone Encounter (Signed)
Attempted to reach pt, let VM to CB.

## 2020-02-08 ENCOUNTER — Other Ambulatory Visit: Payer: Self-pay

## 2020-02-08 ENCOUNTER — Ambulatory Visit: Payer: Medicare Other | Admitting: Occupational Therapy

## 2020-02-08 ENCOUNTER — Encounter: Payer: Self-pay | Admitting: Occupational Therapy

## 2020-02-08 DIAGNOSIS — I890 Lymphedema, not elsewhere classified: Secondary | ICD-10-CM

## 2020-02-08 DIAGNOSIS — G8929 Other chronic pain: Secondary | ICD-10-CM | POA: Diagnosis not present

## 2020-02-08 DIAGNOSIS — M545 Low back pain: Secondary | ICD-10-CM | POA: Diagnosis not present

## 2020-02-08 DIAGNOSIS — M6281 Muscle weakness (generalized): Secondary | ICD-10-CM | POA: Diagnosis not present

## 2020-02-09 NOTE — Patient Instructions (Signed)

## 2020-02-09 NOTE — Therapy (Signed)
[place] MAIN [facility] SERVICES [address] [place], [place], [address] Phone: [phone]   Fax:  [phone]  Occupational Therapy Evaluation: BLE Lymphedema   Patient Details  Name: [name] MRN: [id number] Date of Birth: [birth year] Referring Provider (OT): Ida Rogue, MD   Encounter Date: 02/08/2020   OT End of Session - 02/08/20 1414    Visit Number 1    Number of Visits 36    Date for OT Re-Evaluation 05/08/20    OT Start Time 0205    OT Stop Time 0315    OT Time Calculation (min) 70 min    Activity Tolerance Patient tolerated treatment well;No increased pain    Behavior During Therapy WFL for tasks assessed/performed           Past Medical History:  Diagnosis Date  . Anterolisthesis    Cervical spine  . Asthma   . Connective tissue disorder (HCC)    recurrent carotid arteritis, temporal arteritis, vasculitis mandible, general hepatitis, avascular necrosis bilat  . DDD (degenerative disc disease), lumbar   . Diabetes mellitus without complication ([place])   . Diverticulosis   . Duodenitis   . Gouty arthritis   . Hiatal hernia with GERD   . History of cardiovascular stress test    a. 07/2018 [facility]): Fixed inferoapical defect w/ nl contraction-->attenuation artifact. No ischemia. EF 63%.  . Hyperlipidemia   . Hypertension    a. 02/2019 Renal artery duplex: no evidence of prox RAS.  Marland Kitchen Hyperuricemia   . Keratitis sicca, bilateral ([place])   . Lymphedema of both lower extremities   . Osteoarthritis   . Pericarditis    Recurrent: Aug 97, July 05, Sept 08, July 16, July 18  . PUD (peptic ulcer disease)   . 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[place])   . Sjogren's syndrome ([place])   . Syncope   . Tendonitis, Achilles, right   . Tortuous colon   . Trigger finger     Past Surgical History:  Procedure Laterality Date  . ABDOMINAL HYSTERECTOMY    . BREAST BIOPSY    . BREAST EXCISIONAL BIOPSY Left 1986   neg  .  CHOLECYSTECTOMY    . COLONOSCOPY WITH PROPOFOL N/A 08/26/2018   Procedure: COLONOSCOPY WITH PROPOFOL;  Surgeon: Lucilla Lame, MD;  Location: [facility] ENDOSCOPY;  Service: Endoscopy;  Laterality: N/A;  . CYSTOSCOPY  02/26/2018   [name], MD   . distal arthrectomy    . ESOPHAGOGASTRODUODENOSCOPY (EGD) WITH PROPOFOL N/A 08/26/2018   Procedure: ESOPHAGOGASTRODUODENOSCOPY (EGD) WITH PROPOFOL;  Surgeon: Lucilla Lame, MD;  Location: [facility] ENDOSCOPY;  Service: Endoscopy;  Laterality: N/A;  . EXCISION NEUROMA    . KNEE SURGERY Bilateral    1985, 2001, 11/2002, 12/2006  . panhysterectomy  10/1983  . SHOULDER SURGERY Right    reverse total arthroplasty w/ biceps tenodesis  . TONSILLECTOMY AND ADENOIDECTOMY    . TOTAL HIP ARTHROPLASTY Right 04/2007  . WISDOM TOOTH EXTRACTION      There were no vitals filed for this visit.   Subjective Assessment - 02/09/20 0840    Subjective  [name] is referred to Occupational Therapy for evaluation and treatment of BLE lymphedema (LE) by Ida Rogue, MD. [name] presents with chronic BLE swelling with onset reported as "30 years ago in my 63's." Pt reports her legs were swollen off and on, but she experienced a noticeable exacerbation after air travel. Pt reports her mother had leg swelling. She denies hx of DVT,  but endorses one episode of LE cellulitis. Ms. Hedger tells me she has not undergone formal LE treatment, but she does use what sounds like a basic sequential pneumatic device ("pump" w/ thigh highs) several days per week. She has tried off the shelf compression stockings 3 times without success. ("They hurt.") She finds it difficult to sit and elevate her legs during the day because she's busy with household and caregiving responsibilities. Pt's stated goal is "to get the swelling in my legs down."    Pertinent History PMHx extensive; Dx contributing to leg swelling include OA, B knee and R hip arthroplasties, asthma, HTN. Suspect LE venous  insufficiency.    Limitations chronic pain, chronic leg swelling, generalized weakness, kyphotic posture    Repetition Increases Symptoms    Special Tests + stemmer sign bilaterally base of toes    Patient Stated Goals Reduce LE edema    Currently in Pain? Yes    Pain Location Leg    Pain Orientation Right;Left    Pain Descriptors / Indicators Pressure;Heaviness;Tightness;Tiring;Discomfort    Pain Type Chronic pain    Pain Onset --   30 yrs   Pain Frequency Intermittent    Aggravating Factors  standing, walking, dependent sitting    Multiple Pain Sites Yes             [facility] OT Assessment - 02/09/20 1638      Assessment   Medical Diagnosis BLE Lymphedema    Referring Provider (OT) Ida Rogue, MD    Onset Date/Surgical Date --   > 30 years at age 43   Hand Dominance Right    Prior Therapy No prior CDT; has compression stockings      Precautions   Precautions --   THA, TSA   Precaution Comments DM skin precautions, LYMPHEDEMA PRECAUTIONS: Asthma, PAD      Restrictions   Weight Bearing Restrictions No      Balance Screen   Has the patient fallen in the past 6 months No    Has the patient had a decrease in activity level because of a fear of falling?  Yes    Is the patient reluctant to leave their home because of a fear of falling?  --   covid     Home  Environment   Bathroom Building control surveyor    Additional Comments 6 steps to enter w handrails    Lives With Friend(s)      Prior Function   Level of Independence Independent;Independent with basic ADLs;Independent with household mobility without device;Independent with community mobility without device;Independent with homemaking with ambulation;Independent with gait;Independent with transfers    Vocation Retired    [employer]    Leisure no fun (movies)      IADL   Prior Level of Function Shopping I    Prior Level of Function [employer] I    Prior Level of Function Meal Prep I      Prior Level of Function Community Mobility I      Mobility   Mobility Status Needs assist      Vision - History   Baseline Vision Wears glasses all the time      Cognition   Overall Cognitive Status Within Functional Limits for tasks assessed      Observation/Other Assessments   Focus on Therapeutic Outcomes (FOTO)  Initial 58/100. (Like Pts 45/100)    Outcome Measures Comparative limb volumetrics TBA      Posture/Postural Control   Posture/Postural Control  Postural limitations    Postural Limitations Forward head;Rounded Shoulders      Sensation   Light Touch Appears Intact      Coordination   Gross Motor Movements are Fluid and Coordinated Yes    Fine Motor Movements are Fluid and Coordinated Yes      Palpation   Palpation comment limited by tissue approximation and ankle fibrosis      AROM   Overall AROM  Deficits            LYMPHEDEMA/ONCOLOGY QUESTIONNAIRE - 02/09/20 0001      Lymphedema Assessments   Lymphedema Assessments Lower extremities      Right Lower Extremity Lymphedema   Other Comparative limb volumetrics  TBA bilaterally at initial Rx visit           Mild stage II BLE lymphedema 2/2 suspected CVI and chronic orthopedic inflammation 2/2 OA  Skin  Description Hyper-Keratosis Peau' de Orange Shiny Tight Fibrotic/ Indurated Fatty Doughy spongy     x x  Milf generalized fibrosis distal legs to toes      x   Skin dry Flaky Erythema Macerated   x      Color Redness Present Pallor Blanching Hemosiderin Staining Other     x      Odor Malodorous Yeast Fungal infection  Absent      x   Temperature Warm Cool wnl    x      Pitting Edema   1+ 2+ 3+ 4+ Non-pitting     Distal anterior legs and dorsal feet      Girth Symmetrical Asymmetrical                   Distribution   x   Toes to popliteal fossa bilaterally   Stemmer Sign Positive Negative    Strong BLE    Lymphorrhea History Of:  Present Absent      x   Wounds History  Of Present Absent Venous Arterial Pressure Size      X          Signs of Infection Redness Warmth Erythema Acute Swelling Drainage Borders                    Sensation Light Touch Deep pressure Hypersensitivty   Present Impaired Present Impaired Absent Impaired   x  x       Nails WNL Fungus Other   x    x Hair Growth Symmetrical Asymmetrical   x    Skin Creases Base of toes  Ankles   Base of Fingers Medial Thighs         Abdominal pannus Medial legs     x x                 OT Treatments/Exercises (OP) - 02/09/20 0001      Transfers   Transfers Sit to Stand    Sit to Stand With armrests;From chair/3-in-1      ADLs   Grooming difficulty reaching feet to inspect skin, perform nail and skin care    LB Dressing difficulty fitting street shoes and pants    Functional Mobility impaired bed mobility    Cooking difficulty standing to cook and clean after meals -> leg swelling    ADL Comments all functional and social activities requiring extended standing, walking, and /or dependent positioning  results in increased leg swelling and associated pain    ADL Education Given Yes  Manual Therapy   Manual Therapy Edema management                 OT Education - 02/09/20 1253    Education Details Provided Pt/ family education regarding lymphatic structure and function, etiologies, onset patterns and stages of progression. Discussed  impact of obesity on lymphatic function. Outlined Complete Decongestive Therapy (CDT)  as standard of care and provided in depth information regarding 4 primary components of both Intensive and Self Management Phases, including Manual Lymph Drainage (MLD), compression wrapping and garments, skin care, and therapeutic exercise.   Pilar Plate discussion of high burden of care,  Discussed  Importance of daily, ongoing LE self-care essential to retaining clinical gains and limiting progression.  Lastly, reviewed lymphedema precautions, including  cellulitis risk and difficulty with wound healing. Provided printed Lymphedema Workbook for reference.    Person(s) Educated Patient    Methods Explanation;Demonstration;Handout    Comprehension Verbalized understanding;Returned demonstration               OT Long Term Goals - 02/09/20 1623      OT LONG TERM GOAL #1   Title With modified independent (extra time) Pt will be able to apply knee length, multi-layer, short stretch compression wraps daily from ankle to tibial tuberosity on one leg at a time using correct gradient techniques to return affected limb/s, as closely as possible, to premorbid size and shape, to limit leg pain and infection risk, and to improve safe functional mobility and ADLs performance.    Baseline Dependent    Time 4    Period Days    Status New    Target Date --   5th OT visit (4th OT Rx visit)     OT LONG TERM GOAL #2   Title Pt will be able to verbalize signs and symptoms of cellulitis infection and identify lymphedema precautions using printed resource (modified independence) for reference to decrease infection risk and limit LE progression over time.    Baseline Max A    Time 4    Period Days    Status New    Target Date --   5th OT visit (4th OT Rx visit)     OT LONG TERM GOAL #3   Title Pt will sustain a least 85% compliance with all daily LE self-care home program components throughout Intensive Phase CDT, including impeccable skin care, lymphatic pumping ther ex,  compression wraps and simple self MLD, to ensure optimal limb volume reduction, to limit infection risk and to limit LE progression.    Baseline dependent    Time 12    Period Weeks    Status New    Target Date 05/09/20      OT LONG TERM GOAL #4   Title Using assistive devices (modified independence)  Pt will be able to don and doff appropriate daytime compression garments to limit edema re-accumulation and further progression by issue date.    Baseline Max A    Time 12    Period  Weeks    Status New    Target Date 05/09/20      OT LONG TERM GOAL #5   Title After skilled teaching Pt  will be able to perform all lymphedema self-care home program components with modified independence (extra time, assistive devices PRN) , including simple self MLD, lymphatic pumping exercises, don, doff and wear compression garments  daily, sustain   impeccable skin care daily, to limit lymphedema progression and infection risk.  Baseline Max A    Time 12    Period Weeks    Status New    Target Date 05/09/20      Long Term Additional Goals   Additional Long Term Goals Yes      OT LONG TERM GOAL #6   Title Pt will increase score on FOTO 10 points, from 45 to 55/100, to improve functional performance of basic ADLs.    Baseline Max A    Time 12    Period Weeks    Status New    Target Date 05/09/20                 Plan - 02/09/20 1253    Clinical Impression Statement Boyd Litaker is a 31 y o female  presenting with mild, stage II, BLE lymphedema (LE) 2/2 suspected venous insufficiency and chronic inflammation 2/2 OA. A hereditary component may also be a contributing factor as evidenced by a positive family history of chronic leg swelling in her mother. Onset of LE, primarily distributed below the knees bilaterally and symmetrically, and associated pain 30 years ago has gotten progressively worse and no longer fully resolves with elevation overnight. Patient's Physical FS Primary Measure 58 Patient's intake functional measure is 58 out of 100 (higher number = greater function).Risk Adjusted Statistical FOTO* 45 Given the patient's risk-adjustment variables, like-patients nationally had a FS score of 45 at intake. BLE LE and associated pain contributes to Pt's difficulty with functional ambulation and transfers and with functional performance in all occupational domains, including basic and instrumental ADLs, productive activities and leisure pursuits, social participation.  Appearance of swollen legs also limits body image and affects clothing choices. Mild stage II BLE lymphedema 2/2 suspected CVI and chronic orthopedic inflammation 2/2 OA. Ms.  Funchess will benefit from skilled Occupational Therapy for Intensive and Management Phase Complete Decongestive Therapy (CDT) including manuallymphatic drainage (M LD), skin care, therapeutic exercise and compression bandaging and garments. Emphasis throughout CDT will also focus on Pt education for long term LE self-care. Without skilled OT LE will progress,  infection risk will increase and further functional decline is expected.    OT Occupational Profile and History Comprehensive Assessment- Review of records and extensive additional review of physical, cognitive, psychosocial history related to current functional performance    Occupational performance deficits (Please refer to evaluation for details): ADL's;IADL's;Social Participation;Work;Leisure;Other   body image   Body Structure / Function / Physical Skills ADL;Decreased knowledge of precautions;ROM;Balance;Decreased knowledge of use of DME;Mobility;Edema;Skin integrity;Pain;IADL    Rehab Potential Good    Clinical Decision Making Several treatment options, min-mod task modification necessary    Comorbidities Affecting Occupational Performance: Presence of comorbidities impacting occupational performance    Modification or Assistance to Complete Evaluation  Min-Moderate modification of tasks or assist with assess necessary to complete eval    OT Frequency 2x / week    OT Duration 12 weeks   and PRN   OT Treatment/Interventions Self-care/ADL training;Therapeutic exercise;Manual lymph drainage;Compression bandaging;Patient/family education;Other (comment);Therapeutic activities;DME and/or AE instruction;Manual Therapy    Plan Intensive Phase CDT: 1 leg at a time to limit fall risk. MLD, skin care, ther ex , cimpression wraps -> daytime garments, HOS device, consider  Flexitouch if appropriate    Recommended Other Services Fit w/ appropriate compression garments that are effective, comfortable and  Pt can don using AE and extra time    Consulted and Agree with Plan of Care Patient  Patient will benefit from skilled therapeutic intervention in order to improve the following deficits and impairments:   Body Structure / Function / Physical Skills: ADL, Decreased knowledge of precautions, ROM, Balance, Decreased knowledge of use of DME, Mobility, Edema, Skin integrity, Pain, IADL       Visit Diagnosis: Lymphedema, not elsewhere classified    Problem List Patient Active Problem List   Diagnosis Date Noted  . Multiple thyroid nodules 12/28/2019  . Thyroid nodule 09/29/2019  . Lymphedema 11/10/2018  . PAD (peripheral artery disease) ([place]) 10/12/2018  . Aortic atherosclerosis ([place]) 10/12/2018  . Carotid stenosis, asymptomatic, bilateral 10/12/2018  . Positive colorectal cancer screening using Cologuard test 10/09/2018  . Gastroesophageal reflux disease   . Acute gastritis without hemorrhage   . Osteoarthritis 04/29/2018  . Hiatal hernia with GERD 04/29/2018  . Hyperlipidemia 04/29/2018  . Lymphedema of both lower extremities 04/29/2018  . Left knee pain 10/23/2016  . Cataracts, bilateral 07/31/2016  . Glaucoma 07/31/2016  . Lateral epicondylitis of left elbow 10/20/2015  . Vitamin D deficiency 10/20/2015  . Type 2 diabetes, controlled, with neuropathy ([place]) 10/16/2015  . Gouty arthritis 06/03/2015  . H/O syncope 06/03/2015  . Mild intermittent asthma 06/03/2015  . Multiple gastric ulcers 06/03/2015  . Schatzki's ring 06/03/2015  . Undifferentiated connective tissue disease ([place]) 06/03/2015  . Bone disorder 04/05/2015  . Dental injury 04/05/2015  . Pericarditis 04/05/2015  . Sjogren's syndrome ([place]) 09/07/2014  . Elevated alkaline phosphatase level 08/24/2014  . Lactose intolerance 08/24/2014  . Obesity 08/24/2014  .  Peripheral edema 08/24/2014  . 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 Abdominal adhesions 08/02/2014  . 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[place]) 08/02/2014  . Degenerative joint disease (DJD) of lumbar spine 08/02/2014  . Diverticulosis of both small and large intestine 08/02/2014  . Hyperuricemia 08/02/2014  . Pure hypercholesterolemia 08/02/2014  . Trigger finger 08/02/2014  . Right shoulder pain 07/29/2014  . Benign neoplasm of colon 06/30/2010  . Right upper quadrant pain 06/30/2010    Andrey Spearman, MS, OTR/L, [facility] 02/09/20 4:49 PM  [place] MAIN [facility] SERVICES [address] [place], [place], [address] Phone: [phone]   Fax:  [phone]  Name: Nedda Gains MRN: [id number] Date of Birth: Oct 24, 1945

## 2020-02-10 ENCOUNTER — Other Ambulatory Visit: Payer: Self-pay

## 2020-02-10 ENCOUNTER — Ambulatory Visit: Payer: Medicare Other | Attending: Cardiovascular Disease | Admitting: Occupational Therapy

## 2020-02-10 DIAGNOSIS — I890 Lymphedema, not elsewhere classified: Secondary | ICD-10-CM | POA: Insufficient documentation

## 2020-02-10 NOTE — Therapy (Signed)
[place] MAIN [facility] SERVICES [address] [place], [place], [address] Phone: [phone]   Fax:  [phone]  Occupational Therapy Treatment  Patient Details  Name: [name] MRN: [id number] Date of Birth: 09-Dec-1945 Referring Provider (OT): Ida Rogue, MD   Encounter Date: 02/10/2020   OT End of Session - 02/10/20 1623    Visit Number 2    OT Start Time 0215    OT Stop Time 0308    OT Time Calculation (min) 53 min    Activity Tolerance Patient tolerated treatment well;No increased pain    Behavior During Therapy WFL for tasks assessed/performed           Past Medical History:  Diagnosis Date  . Anterolisthesis    Cervical spine  . Asthma   . Connective tissue disorder (HCC)    recurrent carotid arteritis, temporal arteritis, vasculitis mandible, general hepatitis, avascular necrosis bilat  . DDD (degenerative disc disease), lumbar   . Diabetes mellitus without complication ([place])   . Diverticulosis   . Duodenitis   . Gouty arthritis   . Hiatal hernia with GERD   . History of cardiovascular stress test    a. 07/2018 MV [facility]): Fixed inferoapical defect w/ nl contraction-->attenuation artifact. No ischemia. EF 63%.  . Hyperlipidemia   . Hypertension    a. 02/2019 Renal artery duplex: no evidence of prox RAS.  Marland Kitchen Hyperuricemia   . Keratitis sicca, bilateral ([place])   . Lymphedema of both lower extremities   . Osteoarthritis   . Pericarditis    Recurrent: Aug 97, July 05, Sept 08, July 16, July 18  . PUD (peptic ulcer disease)   . 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[place])   . Sjogren's syndrome ([place])   . Syncope   . Tendonitis, Achilles, right   . Tortuous colon   . Trigger finger     Past Surgical History:  Procedure Laterality Date  . ABDOMINAL HYSTERECTOMY    . BREAST BIOPSY    . BREAST EXCISIONAL BIOPSY Left 1986   neg  . CHOLECYSTECTOMY    . COLONOSCOPY WITH PROPOFOL N/A 08/26/2018   Procedure: COLONOSCOPY WITH  PROPOFOL;  Surgeon: Lucilla Lame, MD;  Location: [facility] ENDOSCOPY;  Service: Endoscopy;  Laterality: N/A;  . CYSTOSCOPY  02/26/2018   [name], MD   . distal arthrectomy    . ESOPHAGOGASTRODUODENOSCOPY (EGD) WITH PROPOFOL N/A 08/26/2018   Procedure: ESOPHAGOGASTRODUODENOSCOPY (EGD) WITH PROPOFOL;  Surgeon: Lucilla Lame, MD;  Location: [facility] ENDOSCOPY;  Service: Endoscopy;  Laterality: N/A;  . EXCISION NEUROMA    . KNEE SURGERY Bilateral    1985, 2001, 11/2002, 12/2006  . panhysterectomy  10/1983  . SHOULDER SURGERY Right    reverse total arthroplasty w/ biceps tenodesis  . TONSILLECTOMY AND ADENOIDECTOMY    . TOTAL HIP ARTHROPLASTY Right 04/2007  . WISDOM TOOTH EXTRACTION      There were no vitals filed for this visit.   Subjective Assessment - 02/10/20 1421    Subjective  [name] presents for OT Rx visit 2 of 36 to address BLE lymphedema. Pt denies  leg pain associated with lymphedema today. She rates R knee pain 2/2 OA a 1/10    Pertinent History PMHx extensive; Dx contributing to leg swelling include OA, B knee and R hip arthroplasties, asthma, HTN. Suspect LE venous insufficiency.    Limitations chronic pain, chronic leg swelling, generalized weakness, kyphotic posture    Repetition Increases Symptoms    Special Tests + stemmer  sign bilaterally base of toes    Patient Stated Goals Reduce LE edema    Pain Onset --   30 yrs              LYMPHEDEMA/ONCOLOGY QUESTIONNAIRE - 02/10/20 0001      Right Lower Extremity Lymphedema   Other RLE (dominant) limb volume from A-D landmarks (below the knee) measures 3449.7 ml.       Left Lower Extremity Lymphedema   Other LLE A-D limb volume measures 3571.2 ml.    Other Initial limb volume differential (LVD) measures 3.4%, L>R, revealing  A-D swelling is nearly symmetrical.                   OT Treatments/Exercises (OP) - 02/10/20 0001      ADLs   ADL Education Given Yes      Manual Therapy   Manual Therapy  Edema management;Compression Bandaging    Edema Management BLE comparative limb volumetrics- initial    Compression Bandaging multilayer gradient compression wraps from base f toes to tibial tuberosity using 8, 10 and 12 cm wise short stretch bandage over 0.4 cm thick Rosidal foam and cotton stockinett                  OT Education - 02/10/20 1501    Education Details Provided Pt and family education regarding lymphatic structure and function, etiologies, onset patterns and stages of progression. Discussed  impact of obesity on lymphatic function. Outlined Complete Decongestive Therapy (CDT)  as standard of care and provided in depth information regarding 4 primary components of both Intensive and Self Management Phases, including Manual Lymph Drainage (MLD), compression wrapping and garments, skin care, and therapeutic exercise.   Pilar Plate discussion of high burden of care,  Discussed  Importance of daily, ongoing LE self-care essential to retaining clinical gains and limiting progression.  Lastly, reviewed lymphedema precautions, including cellulitis risk and difficulty with wound healing. Provided printed Lymphedema Workbook for reference. Pt verbalized understanding that assistance with home program is essential to meet OT rehab goals for LE care.    Person(s) Educated Patient    Methods Explanation;Demonstration;Handout    Comprehension Verbalized understanding;Returned demonstration               OT Long Term Goals - 02/09/20 1623      OT LONG TERM GOAL #1   Title With modified independent (extra time) Pt will be able to apply knee length, multi-layer, short stretch compression wraps daily from ankle to tibial tuberosity on one leg at a time using correct gradient techniques to return affected limb/s, as closely as possible, to premorbid size and shape, to limit leg pain and infection risk, and to improve safe functional mobility and ADLs performance.    Baseline Dependent    Time  4    Period Days    Status New    Target Date --   5th OT visit (4th OT Rx visit)     OT LONG TERM GOAL #2   Title Pt will be able to verbalize signs and symptoms of cellulitis infection and identify lymphedema precautions using printed resource (modified independence) for reference to decrease infection risk and limit LE progression over time.    Baseline Max A    Time 4    Period Days    Status New    Target Date --   5th OT visit (4th OT Rx visit)     OT LONG TERM GOAL #3  Title Pt will sustain a least 85% compliance with all daily LE self-care home program components throughout Intensive Phase CDT, including impeccable skin care, lymphatic pumping ther ex,  compression wraps and simple self MLD, to ensure optimal limb volume reduction, to limit infection risk and to limit LE progression.    Baseline dependent    Time 12    Period Weeks    Status New    Target Date 05/09/20      OT LONG TERM GOAL #4   Title Using assistive devices (modified independence)  Pt will be able to don and doff appropriate daytime compression garments to limit edema re-accumulation and further progression by issue date.    Baseline Max A    Time 12    Period Weeks    Status New    Target Date 05/09/20      OT LONG TERM GOAL #5   Title After skilled teaching Pt  will be able to perform all lymphedema self-care home program components with modified independence (extra time, assistive devices PRN) , including simple self MLD, lymphatic pumping exercises, don, doff and wear compression garments  daily, sustain   impeccable skin care daily, to limit lymphedema progression and infection risk.    Baseline Max A    Time 12    Period Weeks    Status New    Target Date 05/09/20      Long Term Additional Goals   Additional Long Term Goals Yes      OT LONG TERM GOAL #6   Title Pt will increase score on FOTO 10 points, from 45 to 55/100, to improve functional performance of basic ADLs.    Baseline Max A     Time 12    Period Weeks    Status New    Target Date 05/09/20                 Plan - 02/10/20 1623    Clinical Impression Statement Initial BLE comparative limb volumetrics reveal legs below the knees are nearly symetrical volumetrically as evidenced  by 3.4%, L>R, limb volume differential (LVD). Pt tlerated initial gradient compression wraps below the knee on the L. Cont as per POC.    OT Occupational Profile and History Comprehensive Assessment- Review of records and extensive additional review of physical, cognitive, psychosocial history related to current functional performance    Occupational performance deficits (Please refer to evaluation for details): ADL's;IADL's;Social Participation;Work;Leisure;Other   body image   Body Structure / Function / Physical Skills ADL;Decreased knowledge of precautions;ROM;Balance;Decreased knowledge of use of DME;Mobility;Edema;Skin integrity;Pain;IADL    Rehab Potential Good    Clinical Decision Making Several treatment options, min-mod task modification necessary    Comorbidities Affecting Occupational Performance: Presence of comorbidities impacting occupational performance    Modification or Assistance to Complete Evaluation  Min-Moderate modification of tasks or assist with assess necessary to complete eval    OT Frequency 2x / week    OT Duration 12 weeks   and PRN   OT Treatment/Interventions Self-care/ADL training;Therapeutic exercise;Manual lymph drainage;Compression bandaging;Patient/family education;Other (comment);Therapeutic activities;DME and/or AE instruction;Manual Therapy    Plan Intensive Phase CDT: 1 leg at a time to limit fall risk. MLD, skin care, ther ex , cimpression wraps -> daytime garments, HOS device, consider Flexitouch if appropriate    Recommended Other Services Fit w/ appropriate compression garments that are effective, comfortable and  Pt can don using AE and extra time    Consulted and Agree with Plan  of Care Patient            Patient will benefit from skilled therapeutic intervention in order to improve the following deficits and impairments:   Body Structure / Function / Physical Skills: ADL, Decreased knowledge of precautions, ROM, Balance, Decreased knowledge of use of DME, Mobility, Edema, Skin integrity, Pain, IADL       Visit Diagnosis: Lymphedema, not elsewhere classified    Problem List Patient Active Problem List   Diagnosis Date Noted  . Multiple thyroid nodules 12/28/2019  . Thyroid nodule 09/29/2019  . Lymphedema 11/10/2018  . PAD (peripheral artery disease) ([place]) 10/12/2018  . Aortic atherosclerosis ([place]) 10/12/2018  . Carotid stenosis, asymptomatic, bilateral 10/12/2018  . Positive colorectal cancer screening using Cologuard test 10/09/2018  . Gastroesophageal reflux disease   . Acute gastritis without hemorrhage   . Osteoarthritis 04/29/2018  . Hiatal hernia with GERD 04/29/2018  . Hyperlipidemia 04/29/2018  . Lymphedema of both lower extremities 04/29/2018  . Left knee pain 10/23/2016  . Cataracts, bilateral 07/31/2016  . Glaucoma 07/31/2016  . Lateral epicondylitis of left elbow 10/20/2015  . Vitamin D deficiency 10/20/2015  . Type 2 diabetes, controlled, with neuropathy ([place]) 10/16/2015  . Gouty arthritis 06/03/2015  . H/O syncope 06/03/2015  . Mild intermittent asthma 06/03/2015  . Multiple gastric ulcers 06/03/2015  . Schatzki's ring 06/03/2015  . Undifferentiated connective tissue disease ([place]) 06/03/2015  . Bone disorder 04/05/2015  . Dental injury 04/05/2015  . Pericarditis 04/05/2015  . Sjogren's syndrome ([place]) 09/07/2014  . Elevated alkaline phosphatase level 08/24/2014  . Lactose intolerance 08/24/2014  . Obesity 08/24/2014  . Peripheral edema 08/24/2014  . 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 Abdominal adhesions 08/02/2014  . 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[place]) 08/02/2014  . Degenerative joint  disease (DJD) of lumbar spine 08/02/2014  . Diverticulosis of both small and large intestine 08/02/2014  . Hyperuricemia 08/02/2014  . Pure hypercholesterolemia 08/02/2014  . Trigger finger 08/02/2014  . Right shoulder pain 07/29/2014  . Benign neoplasm of colon 06/30/2010  . Right upper quadrant pain 06/30/2010    Andrey Spearman, MS, OTR/L, [facility] 02/10/20 4:29 PM  [place] MAIN [facility] SERVICES [address] [place], [place], [address] Phone: [phone]   Fax:  [phone]  Name: [name] MRN: [id number] Date of Birth: [birth year]

## 2020-02-17 ENCOUNTER — Other Ambulatory Visit: Payer: Self-pay

## 2020-02-17 ENCOUNTER — Ambulatory Visit: Payer: Medicare Other | Admitting: Occupational Therapy

## 2020-02-17 DIAGNOSIS — I890 Lymphedema, not elsewhere classified: Secondary | ICD-10-CM

## 2020-02-17 NOTE — Therapy (Signed)
[place] MAIN [facility] SERVICES [address] [place], [place], [address] Phone: [phone]   Fax:  [phone]  Occupational Therapy Treatment  Patient Details  Name: [name] MRN: [id number] Date of Birth: [birth year] Referring Provider (OT): Ida Rogue, MD   Encounter Date: 02/17/2020   OT End of Session - 02/17/20 1451    Visit Number 3    OT Start Time 0205    OT Stop Time 0300    OT Time Calculation (min) 55 min           Past Medical History:  Diagnosis Date  . Anterolisthesis    Cervical spine  . Asthma   . Connective tissue disorder (HCC)    recurrent carotid arteritis, temporal arteritis, vasculitis mandible, general hepatitis, avascular necrosis bilat  . DDD (degenerative disc disease), lumbar   . Diabetes mellitus without complication ([place])   . Diverticulosis   . Duodenitis   . Gouty arthritis   . Hiatal hernia with GERD   . History of cardiovascular stress test    a. 07/2018 [facility] Fixed inferoapical defect w/ nl contraction-->attenuation artifact. No ischemia. EF 63%.  . Hyperlipidemia   . Hypertension    a. 02/2019 Renal artery duplex: no evidence of prox RAS.  Marland Kitchen Hyperuricemia   . Keratitis sicca, bilateral ([place])   . Lymphedema of both lower extremities   . Osteoarthritis   . Pericarditis    Recurrent: Aug 97, July 05, Sept 08, July 16, July 18  . PUD (peptic ulcer disease)   . 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[place])   . Sjogren's syndrome ([place])   . Syncope   . Tendonitis, Achilles, right   . Tortuous colon   . Trigger finger     Past Surgical History:  Procedure Laterality Date  . ABDOMINAL HYSTERECTOMY    . BREAST BIOPSY    . BREAST EXCISIONAL BIOPSY Left 1986   neg  . CHOLECYSTECTOMY    . COLONOSCOPY WITH PROPOFOL N/A 08/26/2018   Procedure: COLONOSCOPY WITH PROPOFOL;  Surgeon: Lucilla Lame, MD;  Location: [facility] ENDOSCOPY;  Service: Endoscopy;  Laterality: N/A;  . CYSTOSCOPY  02/26/2018    [name], MD   . distal arthrectomy    . ESOPHAGOGASTRODUODENOSCOPY (EGD) WITH PROPOFOL N/A 08/26/2018   Procedure: ESOPHAGOGASTRODUODENOSCOPY (EGD) WITH PROPOFOL;  Surgeon: Lucilla Lame, MD;  Location: [facility] ENDOSCOPY;  Service: Endoscopy;  Laterality: N/A;  . EXCISION NEUROMA    . KNEE SURGERY Bilateral    1985, 2001, 11/2002, 12/2006  . panhysterectomy  10/1983  . SHOULDER SURGERY Right    reverse total arthroplasty w/ biceps tenodesis  . TONSILLECTOMY AND ADENOIDECTOMY    . TOTAL HIP ARTHROPLASTY Right 04/2007  . WISDOM TOOTH EXTRACTION      There were no vitals filed for this visit.   Subjective Assessment - 02/17/20 1413    Subjective  [name] presents for OT Rx visit 3 of 36 to address BLE lymphedema. Pt reports leg pain after removing inital compression wraps after last visit. Pt denies leg pain this afternoon.    Pertinent History PMHx extensive; Dx contributing to leg swelling include OA, B knee and R hip arthroplasties, asthma, HTN. Suspect LE venous insufficiency.    Limitations chronic pain, chronic leg swelling, generalized weakness, kyphotic posture    Repetition Increases Symptoms    Special Tests + stemmer sign bilaterally base of toes    Patient Stated Goals Reduce LE edema    Pain Onset --  30 yrs              LYMPHEDEMA/ONCOLOGY QUESTIONNAIRE - 02/17/20 0001      Left Lower Extremity Lymphedema   Other limb volume differential (LVD) measures 3.5%, L>R                   OT Treatments/Exercises (OP) - 02/17/20 0001      ADLs   ADL Education Given Yes      Manual Therapy   Manual Therapy Edema management;Compression Bandaging                  OT Education - 02/17/20 1420    Education Details Pt edu for multilayer compression wrapping with short stretch bandages using gradient techniques    Person(s) Educated Patient    Methods Explanation;Demonstration;Handout    Comprehension Verbalized understanding;Returned  demonstration               OT Long Term Goals - 02/09/20 1623      OT LONG TERM GOAL #1   Title With modified independent (extra time) Pt will be able to apply knee length, multi-layer, short stretch compression wraps daily from ankle to tibial tuberosity on one leg at a time using correct gradient techniques to return affected limb/s, as closely as possible, to premorbid size and shape, to limit leg pain and infection risk, and to improve safe functional mobility and ADLs performance.    Baseline Dependent    Time 4    Period Days    Status New    Target Date --   5th OT visit (4th OT Rx visit)     OT LONG TERM GOAL #2   Title Pt will be able to verbalize signs and symptoms of cellulitis infection and identify lymphedema precautions using printed resource (modified independence) for reference to decrease infection risk and limit LE progression over time.    Baseline Max A    Time 4    Period Days    Status New    Target Date --   5th OT visit (4th OT Rx visit)     OT LONG TERM GOAL #3   Title Pt will sustain a least 85% compliance with all daily LE self-care home program components throughout Intensive Phase CDT, including impeccable skin care, lymphatic pumping ther ex,  compression wraps and simple self MLD, to ensure optimal limb volume reduction, to limit infection risk and to limit LE progression.    Baseline dependent    Time 12    Period Weeks    Status New    Target Date 05/09/20      OT LONG TERM GOAL #4   Title Using assistive devices (modified independence)  Pt will be able to don and doff appropriate daytime compression garments to limit edema re-accumulation and further progression by issue date.    Baseline Max A    Time 12    Period Weeks    Status New    Target Date 05/09/20      OT LONG TERM GOAL #5   Title After skilled teaching Pt  will be able to perform all lymphedema self-care home program components with modified independence (extra time, assistive  devices PRN) , including simple self MLD, lymphatic pumping exercises, don, doff and wear compression garments  daily, sustain   impeccable skin care daily, to limit lymphedema progression and infection risk.    Baseline Max A    Time 12    Period Weeks  Status New    Target Date 05/09/20      Long Term Additional Goals   Additional Long Term Goals Yes      OT LONG TERM GOAL #6   Title Pt will increase score on FOTO 10 points, from 45 to 55/100, to improve functional performance of basic ADLs.    Baseline Max A    Time 12    Period Weeks    Status New    Target Date 05/09/20                  Patient will benefit from skilled therapeutic intervention in order to improve the following deficits and impairments:           Visit Diagnosis: Lymphedema, not elsewhere classified    Problem List Patient Active Problem List   Diagnosis Date Noted  . Multiple thyroid nodules 12/28/2019  . Thyroid nodule 09/29/2019  . Lymphedema 11/10/2018  . PAD (peripheral artery disease) ([place]) 10/12/2018  . Aortic atherosclerosis ([place]) 10/12/2018  . Carotid stenosis, asymptomatic, bilateral 10/12/2018  . Positive colorectal cancer screening using Cologuard test 10/09/2018  . Gastroesophageal reflux disease   . Acute gastritis without hemorrhage   . Osteoarthritis 04/29/2018  . Hiatal hernia with GERD 04/29/2018  . Hyperlipidemia 04/29/2018  . Lymphedema of both lower extremities 04/29/2018  . Left knee pain 10/23/2016  . Cataracts, bilateral 07/31/2016  . Glaucoma 07/31/2016  . Lateral epicondylitis of left elbow 10/20/2015  . Vitamin D deficiency 10/20/2015  . Type 2 diabetes, controlled, with neuropathy ([place]) 10/16/2015  . Gouty arthritis 06/03/2015  . H/O syncope 06/03/2015  . Mild intermittent asthma 06/03/2015  . Multiple gastric ulcers 06/03/2015  . Schatzki's ring 06/03/2015  . Undifferentiated connective tissue disease ([place]) 06/03/2015  . Bone disorder 04/05/2015    . Dental injury 04/05/2015  . Pericarditis 04/05/2015  . Sjogren's syndrome ([place]) 09/07/2014  . Elevated alkaline phosphatase level 08/24/2014  . Lactose intolerance 08/24/2014  . Obesity 08/24/2014  . Peripheral edema 08/24/2014  . 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 Abdominal adhesions 08/02/2014  . 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[place]) 08/02/2014  . Degenerative joint disease (DJD) of lumbar spine 08/02/2014  . Diverticulosis of both small and large intestine 08/02/2014  . Hyperuricemia 08/02/2014  . Pure hypercholesterolemia 08/02/2014  . Trigger finger 08/02/2014  . Right shoulder pain 07/29/2014  . Benign neoplasm of colon 06/30/2010  . Right upper quadrant pain 06/30/2010    Andrey Spearman, MS, OTR/L, [facility] 02/17/20 2:56 PM  [place] MAIN [facility] SERVICES [address] [place], [place], [address] Phone: [phone]   Fax:  [phone]  Name: Lovinia Snare MRN: [id number] Date of Birth: Feb 02, 1946

## 2020-02-21 DIAGNOSIS — Z23 Encounter for immunization: Secondary | ICD-10-CM | POA: Diagnosis not present

## 2020-02-22 ENCOUNTER — Encounter: Payer: Medicare Other | Admitting: Occupational Therapy

## 2020-02-23 ENCOUNTER — Telehealth: Payer: Self-pay | Admitting: Family Medicine

## 2020-02-23 ENCOUNTER — Ambulatory Visit: Payer: Self-pay

## 2020-02-23 NOTE — Telephone Encounter (Signed)
If she would like a refill on the zofran I can send in a refill for her.  Would encourage her to increase her fluids and rest.  These are some common side effects with the COVID vaccine and I would encourage her to have the second dose when she is due in a few weeks.

## 2020-02-23 NOTE — Telephone Encounter (Signed)
Pt. Reports she received her first COVID 19 vaccine Sunday Therapist, music). Monday morning started "having issues with my equilibrium like my inner ear and nausea, vomiting." Taking OTC Dramamine and "Zofran I had here at home." Has eaten this morning without vomiting. Wants her PCP to know and to give her further recommendations.  Answer Assessment - Initial Assessment Questions 1. SYMPTOMS: "What is the main symptom?" (e.g., redness, swelling, pain)      Dizziness, nausea and vomiting 2. ONSET: "When was the vaccine (shot) given?" "How much later did the nausea  6 hours begin?" (e.g., hours, days ago)       3. SEVERITY: "How bad is it?"      Moderate 4. FEVER: "Is there a fever?" If Yes, ask: "What is it, how was it measured, and when did it start?"      No 5. IMMUNIZATIONS GIVEN: "What shots have you recently received?"     Zoar 6. PAST REACTIONS: "Have you reacted to immunizations before?" If Yes, ask: "What happened?"     No 7. OTHER SYMPTOMS: "Do you have any other symptoms?"     No  Protocols used: IMMUNIZATION REACTIONS-A-AH

## 2020-02-23 NOTE — Telephone Encounter (Signed)
Patient returned call and I read the note By Cyndia Skeeters  Written 02/23/20.  She states she has zofran and needs to km=now if she can take Dramanine in larger dose than is on the package.  She states that it say to ask your PCP. Patient was told to follow directions on label.  Please advise patient

## 2020-02-24 ENCOUNTER — Ambulatory Visit: Payer: Medicare Other | Admitting: Occupational Therapy

## 2020-02-24 NOTE — Telephone Encounter (Signed)
I notified the patient of [name] recommendation. She ask whats the maximum amount of Meclizine 25MG  can she take daily. [name] verbalize that she can take 25MG  TID as needed. [name] verbalize understanding of this.

## 2020-02-24 NOTE — Telephone Encounter (Signed)
I attempted to contact the patient back on yesterday. She answer the phone, but there was a bad connection and the phone call was disconnected. I called back again this morning and the phone call went straight to her voicemail.

## 2020-02-29 ENCOUNTER — Ambulatory Visit: Payer: Medicare Other | Admitting: Occupational Therapy

## 2020-03-01 ENCOUNTER — Telehealth: Payer: Self-pay

## 2020-03-01 NOTE — Telephone Encounter (Signed)
Copied from [place] [id number]. Topic: Quick Communication - See Telephone Encounter >> Mar 01, 2020 11:54 AM [place] wrote: CRM for notification. See Telephone encounter for: 03/01/20.Pt stating had a terrible reaction to her first pfizer vaccine and has been told that does not need to take until  FU,  needs to have an allergy test done to find out exact what she is allergic to. Pt is waiting a referral to an allergist and a cb from [facility] if at all possible. Pt really wants to be vaccinated and is quite anxious.

## 2020-03-02 ENCOUNTER — Other Ambulatory Visit: Payer: Self-pay

## 2020-03-02 ENCOUNTER — Telehealth: Payer: Self-pay | Admitting: Pharmacist

## 2020-03-02 ENCOUNTER — Other Ambulatory Visit: Payer: Self-pay | Admitting: Family Medicine

## 2020-03-02 ENCOUNTER — Ambulatory Visit: Payer: Medicare Other | Admitting: Occupational Therapy

## 2020-03-02 DIAGNOSIS — I890 Lymphedema, not elsewhere classified: Secondary | ICD-10-CM

## 2020-03-02 DIAGNOSIS — T50Z95A Adverse effect of other vaccines and biological substances, initial encounter: Secondary | ICD-10-CM

## 2020-03-02 NOTE — Telephone Encounter (Signed)
Referral placed to allergist.  She should hear something within the next week.  If she has not heard anything back by next Wednesday, to let us know and we can follow up with the referral coordinator.

## 2020-03-02 NOTE — Telephone Encounter (Signed)
°  Chronic Care Management   Outreach Note  03/02/2020 Name: [name] MRN: [id number] DOB: 1945-11-06  Referred by: Verl Bangs, FNP Reason for referral : No chief complaint on file.  Receive a voicemail from patient requesting a call back  Was unable to reach patient via telephone today and have left HIPAA compliant voicemail asking patient to return my call.   Follow Up Plan: CM Pharmacist will reach out to patient, as previously scheduled, on 9/24  Harlow Asa, PharmD, [place] [facility] [phone]

## 2020-03-02 NOTE — Therapy (Signed)
[place] [facility] SERVICES [address] [place], [place], [address] Phone: [phone]   Fax:  [phone]  Occupational Therapy Treatment  Patient Details  Name: [name] MRN: [id number] Date of Birth: 1946/03/18 Referring Provider (OT): Ida Rogue, MD   Encounter Date: 03/02/2020   OT End of Session - 03/02/20 1654    Visit Number 4    OT Start Time 0210    OT Stop Time 0305    OT Time Calculation (min) 55 min    Activity Tolerance Patient tolerated treatment well    Behavior During Therapy [facility] for tasks assessed/performed           Past Medical History:  Diagnosis Date  . Anterolisthesis    Cervical spine  . Asthma   . Connective tissue disorder (HCC)    recurrent carotid arteritis, temporal arteritis, vasculitis mandible, general hepatitis, avascular necrosis bilat  . DDD (degenerative disc disease), lumbar   . Diabetes mellitus without complication ([place])   . Diverticulosis   . Duodenitis   . Gouty arthritis   . Hiatal hernia with GERD   . History of cardiovascular stress test    a. 07/2018 [facility]): Fixed inferoapical defect w/ nl contraction-->attenuation artifact. No ischemia. EF 63%.  . Hyperlipidemia   . Hypertension    a. 02/2019 Renal artery duplex: no evidence of prox RAS.  Marland Kitchen Hyperuricemia   . Keratitis sicca, bilateral ([place])   . Lymphedema of both lower extremities   . Osteoarthritis   . Pericarditis    Recurrent: Aug 97, July 05, Sept 08, July 16, July 18  . PUD (peptic ulcer disease)   . 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[place])   . Sjogren's syndrome ([place])   . Syncope   . Tendonitis, Achilles, right   . Tortuous colon   . Trigger finger     Past Surgical History:  Procedure Laterality Date  . ABDOMINAL HYSTERECTOMY    . BREAST BIOPSY    . BREAST EXCISIONAL BIOPSY Left 1986   neg  . CHOLECYSTECTOMY    . COLONOSCOPY WITH PROPOFOL N/A 08/26/2018   Procedure: COLONOSCOPY WITH PROPOFOL;  Surgeon:  Lucilla Lame, MD;  Location: [facility] ENDOSCOPY;  Service: Endoscopy;  Laterality: N/A;  . CYSTOSCOPY  02/26/2018   [name], MD   . distal arthrectomy    . ESOPHAGOGASTRODUODENOSCOPY (EGD) WITH PROPOFOL N/A 08/26/2018   Procedure: ESOPHAGOGASTRODUODENOSCOPY (EGD) WITH PROPOFOL;  Surgeon: Lucilla Lame, MD;  Location: [facility] ENDOSCOPY;  Service: Endoscopy;  Laterality: N/A;  . EXCISION NEUROMA    . KNEE SURGERY Bilateral    1985, 2001, 11/2002, 12/2006  . panhysterectomy  10/1983  . SHOULDER SURGERY Right    reverse total arthroplasty w/ biceps tenodesis  . TONSILLECTOMY AND ADENOIDECTOMY    . TOTAL HIP ARTHROPLASTY Right 04/2007  . WISDOM TOOTH EXTRACTION      There were no vitals filed for this visit.   Subjective Assessment - 03/02/20 1423    Subjective  [name] presents for OT Rx visit 4 of 36 to address BLE lymphedema. Pt missed last visit due to reaction to Covid vaccine. Pt reports she was able to rewrap her L leg during visit interval up until last Monday, but she has not used compression wrap for more than a week during illness. OT provided 2nd set of compression wraps today. Legs are visibly very swollen bilaterally.    Pertinent History PMHx extensive; Dx contributing to leg swelling include OA, B knee and R  hip arthroplasties, asthma, HTN. Suspect LE venous insufficiency.    Limitations chronic pain, chronic leg swelling, generalized weakness, kyphotic posture    Repetition Increases Symptoms    Special Tests + stemmer sign bilaterally base of toes    Patient Stated Goals Reduce LE edema    Pain Score 0-No pain    Pain Onset --   30 yrs                       OT Treatments/Exercises (OP) - 03/02/20 0001      ADLs   ADL Education Given Yes      Manual Therapy   Manual Therapy Edema management;Manual Lymphatic Drainage (MLD);Compression Bandaging    Manual Lymphatic Drainage (MLD) commenced MLD to LLE utilzing short neck sequence, deep abdominal breathing,  functional inguinal LNs and J strokes to thigh, knee region  , leg and foot. Once sequences complete reversed them to terminus. Good tolerance. despite some increased pain at bottle neck region at medial leg and knee.    Compression Bandaging multilayer gradient compression wraps from base f toes to tibial tuberosity using 8, 10 and 12 cm wise short stretch bandage over 0.4 cm thick Rosidal foam and cotton stockinett                       OT Long Term Goals - 02/09/20 1623      OT LONG TERM GOAL #1   Title With modified independent (extra time) Pt will be able to apply knee length, multi-layer, short stretch compression wraps daily from ankle to tibial tuberosity on one leg at a time using correct gradient techniques to return affected limb/s, as closely as possible, to premorbid size and shape, to limit leg pain and infection risk, and to improve safe functional mobility and ADLs performance.    Baseline Dependent    Time 4    Period Days    Status New    Target Date --   5th OT visit (4th OT Rx visit)     OT LONG TERM GOAL #2   Title Pt will be able to verbalize signs and symptoms of cellulitis infection and identify lymphedema precautions using printed resource (modified independence) for reference to decrease infection risk and limit LE progression over time.    Baseline Max A    Time 4    Period Days    Status New    Target Date --   5th OT visit (4th OT Rx visit)     OT LONG TERM GOAL #3   Title Pt will sustain a least 85% compliance with all daily LE self-care home program components throughout Intensive Phase CDT, including impeccable skin care, lymphatic pumping ther ex,  compression wraps and simple self MLD, to ensure optimal limb volume reduction, to limit infection risk and to limit LE progression.    Baseline dependent    Time 12    Period Weeks    Status New    Target Date 05/09/20      OT LONG TERM GOAL #4   Title Using assistive devices (modified  independence)  Pt will be able to don and doff appropriate daytime compression garments to limit edema re-accumulation and further progression by issue date.    Baseline Max A    Time 12    Period Weeks    Status New    Target Date 05/09/20      OT LONG TERM GOAL #5  Title After skilled teaching Pt  will be able to perform all lymphedema self-care home program components with modified independence (extra time, assistive devices PRN) , including simple self MLD, lymphatic pumping exercises, don, doff and wear compression garments  daily, sustain   impeccable skin care daily, to limit lymphedema progression and infection risk.    Baseline Max A    Time 12    Period Weeks    Status New    Target Date 05/09/20      Long Term Additional Goals   Additional Long Term Goals Yes      OT LONG TERM GOAL #6   Title Pt will increase score on FOTO 10 points, from 45 to 55/100, to improve functional performance of basic ADLs.    Baseline Max A    Time 12    Period Weeks    Status New    Target Date 05/09/20                 Plan - 03/02/20 1653    Clinical Impression Statement Pt edu for lymphatic structure and function as well as watershed maps. Reviewed anatomical basis for MLD. commenced MLD to LLE utilzing short neck sequence, deep abdominal breathing, functional inguinal LNs and J strokes to thigh, knee region  , leg and foot. Once sequences complete reversed them to terminus. Good tolerance. despite some increased pain at bottle neck region at medial leg and knee.Three layer gradient wrap to knee on L leg  . Cnot as per POC.           Patient will benefit from skilled therapeutic intervention in order to improve the following deficits and impairments:           Visit Diagnosis: Lymphedema, not elsewhere classified    Problem List Patient Active Problem List   Diagnosis Date Noted  . Multiple thyroid nodules 12/28/2019  . Thyroid nodule 09/29/2019  . Lymphedema  11/10/2018  . PAD (peripheral artery disease) ([place]) 10/12/2018  . Aortic atherosclerosis ([place]) 10/12/2018  . Carotid stenosis, asymptomatic, bilateral 10/12/2018  . Positive colorectal cancer screening using Cologuard test 10/09/2018  . Gastroesophageal reflux disease   . Acute gastritis without hemorrhage   . Osteoarthritis 04/29/2018  . Hiatal hernia with GERD 04/29/2018  . Hyperlipidemia 04/29/2018  . Lymphedema of both lower extremities 04/29/2018  . Left knee pain 10/23/2016  . Cataracts, bilateral 07/31/2016  . Glaucoma 07/31/2016  . Lateral epicondylitis of left elbow 10/20/2015  . Vitamin D deficiency 10/20/2015  . Type 2 diabetes, controlled, with neuropathy ([place]) 10/16/2015  . Gouty arthritis 06/03/2015  . H/O syncope 06/03/2015  . Mild intermittent asthma 06/03/2015  . Multiple gastric ulcers 06/03/2015  . Schatzki's ring 06/03/2015  . Undifferentiated connective tissue disease ([place]) 06/03/2015  . Bone disorder 04/05/2015  . Dental injury 04/05/2015  . Pericarditis 04/05/2015  . Sjogren's syndrome ([place]) 09/07/2014  . Elevated alkaline phosphatase level 08/24/2014  . Lactose intolerance 08/24/2014  . Obesity 08/24/2014  . Peripheral edema 08/24/2014  . 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 Abdominal adhesions 08/02/2014  . 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[place]) 08/02/2014  . Degenerative joint disease (DJD) of lumbar spine 08/02/2014  . Diverticulosis of both small and large intestine 08/02/2014  . Hyperuricemia 08/02/2014  . Pure hypercholesterolemia 08/02/2014  . Trigger finger 08/02/2014  . Right shoulder pain 07/29/2014  . Benign neoplasm of colon 06/30/2010  . Right upper quadrant pain 06/30/2010  Andrey Spearman, MS, OTR/L, [facility] 03/02/20 4:56 PM  [place] MAIN [facility] SERVICES [address] [place], [place], [address] Phone: [phone]   Fax:   [phone]  Name: [name] MRN: [id number] Date of Birth: [birth year]

## 2020-03-03 ENCOUNTER — Ambulatory Visit: Payer: Medicare Other | Attending: Cardiovascular Disease | Admitting: Physical Therapy

## 2020-03-03 ENCOUNTER — Other Ambulatory Visit: Payer: Self-pay | Admitting: Family Medicine

## 2020-03-03 DIAGNOSIS — G8929 Other chronic pain: Secondary | ICD-10-CM

## 2020-03-03 DIAGNOSIS — M6281 Muscle weakness (generalized): Secondary | ICD-10-CM

## 2020-03-03 DIAGNOSIS — R1011 Right upper quadrant pain: Secondary | ICD-10-CM

## 2020-03-03 DIAGNOSIS — E1140 Type 2 diabetes mellitus with diabetic neuropathy, unspecified: Secondary | ICD-10-CM

## 2020-03-03 DIAGNOSIS — I890 Lymphedema, not elsewhere classified: Secondary | ICD-10-CM | POA: Insufficient documentation

## 2020-03-03 DIAGNOSIS — M545 Low back pain: Secondary | ICD-10-CM | POA: Insufficient documentation

## 2020-03-03 NOTE — Telephone Encounter (Signed)
Patient notified

## 2020-03-03 NOTE — Telephone Encounter (Signed)
Requested  medications are  due for refill today yes  Requested medications are on the active medication list yes  Last refill 8/29  Last visit June  Notes to clinic Not Delegated

## 2020-03-04 ENCOUNTER — Other Ambulatory Visit: Payer: Self-pay

## 2020-03-04 ENCOUNTER — Telehealth: Payer: Self-pay | Admitting: Pharmacist

## 2020-03-04 ENCOUNTER — Telehealth: Payer: Self-pay

## 2020-03-04 NOTE — Telephone Encounter (Signed)
  Chronic Care Management   Outreach Note  03/04/2020 Name: [name] MRN: [id number] DOB: 21-Mar-1946  Referred by: Verl Bangs, FNP Reason for referral : No chief complaint on file.   Was unable to reach patient via telephone today and have left HIPAA compliant voicemail asking patient to return my call.    Follow Up Plan: Will collaborate with Care Guide to outreach to schedule follow up with me  Harlow Asa, PharmD, [place] Management [phone]

## 2020-03-05 ENCOUNTER — Encounter: Payer: Self-pay | Admitting: Physical Therapy

## 2020-03-05 NOTE — Therapy (Addendum)
[place] [facility] [address] [place], [place], [address] Phone: [phone]   Fax:  [phone]  Physical Therapy Treatment  Patient Details  Name: [name] MRN: [id number] Date of Birth: 11-30-1945 Referring Provider (PT): Dr. Parks Ranger   Encounter Date: 03/03/2020    Treatment: 16 of 24.  Recert date:  80/07/2334 1647 to 1740   Past Medical History:  Diagnosis Date  . Anterolisthesis    Cervical spine  . Asthma   . Connective tissue disorder (HCC)    recurrent carotid arteritis, temporal arteritis, vasculitis mandible, general hepatitis, avascular necrosis bilat  . DDD (degenerative disc disease), lumbar   . Diabetes mellitus without complication ([place])   . Diverticulosis   . Duodenitis   . Gouty arthritis   . Hiatal hernia with GERD   . History of cardiovascular stress test    a. 07/2018 [facility]): Fixed inferoapical defect w/ nl contraction-->attenuation artifact. No ischemia. EF 63%.  . Hyperlipidemia   . Hypertension    a. 02/2019 Renal artery duplex: no evidence of prox RAS.  Marland Kitchen Hyperuricemia   . Keratitis sicca, bilateral ([place])   . Lymphedema of both lower extremities   . Osteoarthritis   . Pericarditis    Recurrent: Aug 97, July 05, Sept 08, July 16, July 18  . PUD (peptic ulcer disease)   . 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[place])   . Sjogren's syndrome ([place])   . Syncope   . Tendonitis, Achilles, right   . Tortuous colon   . Trigger finger     Past Surgical History:  Procedure Laterality Date  . ABDOMINAL HYSTERECTOMY    . BREAST BIOPSY    . BREAST EXCISIONAL BIOPSY Left 1986   neg  . CHOLECYSTECTOMY    . COLONOSCOPY WITH PROPOFOL N/A 08/26/2018   Procedure: COLONOSCOPY WITH PROPOFOL;  Surgeon: Lucilla Lame, MD;  Location: [facility] ENDOSCOPY;  Service: Endoscopy;  Laterality: N/A;  . CYSTOSCOPY  02/26/2018   [name], MD   . distal arthrectomy    . ESOPHAGOGASTRODUODENOSCOPY (EGD) WITH PROPOFOL N/A  08/26/2018   Procedure: ESOPHAGOGASTRODUODENOSCOPY (EGD) WITH PROPOFOL;  Surgeon: Lucilla Lame, MD;  Location: [facility] ENDOSCOPY;  Service: Endoscopy;  Laterality: N/A;  . EXCISION NEUROMA    . KNEE SURGERY Bilateral    1985, 2001, 11/2002, 12/2006  . panhysterectomy  10/1983  . SHOULDER SURGERY Right    reverse total arthroplasty w/ biceps tenodesis  . TONSILLECTOMY AND ADENOIDECTOMY    . TOTAL HIP ARTHROPLASTY Right 04/2007  . WISDOM TOOTH EXTRACTION      There were no vitals filed for this visit.       Pt. reports persistent low back/hip pain over past several days. Pt. presents with lower leg lymphadema wrapping. Pt. continues to work with Helene Kelp at [facility] 2x/week for lymph massage/ wrapping. BP: 166/69        There.ex.:  Walking in clinic with assessment of gait pattern. Supine SLR 10x2/ clamshell with RTB (unilateral/ bilateral) 20x.   Bridging 20x (easily fatigued) Reviewed HEP  (no Nustep due to knee pain/ issues)  Manual txl  Supine hamstring stretches 3x on L/R (gentle ankle pumps/ glides as tolerated)- pt. Tender with hand placement on ankle/calf.  Supine knee to chest (unilateral)/ piriformis stretching). Supine/ seated trunk rotn with light overpressure Seated posture correction/ STM to paraspinals      PT Long Term Goals - 12/25/19 1313      PT LONG TERM GOAL #1   Title Pt. will  be independent with HEP to increase B hip/LE muscle strengthening 1/2 muscle grade to improve pain-free mobility/ upright posture.    Baseline Generalized B LE muscle weakness grossly 4/5 MMT (pain limited L hip/ R knee).    Time 8    Period Weeks    Status Not Met    Target Date 03/17/20      PT LONG TERM GOAL #2   Title Pt. will increase FOTO to 58 to improve pain-free mobility.    Baseline Initial FOTO: 53    Time 12    Period Weeks    Status On-going    Target Date 03/17/20      PT LONG TERM GOAL #3   Title Pt. will demonstrate/ maintain proper upright posture with  standing tolerance with no increase c/o back pain to improve cooking.    Baseline Moderate rounded shoulders/ forward head posture.    Time 12    Period Weeks    Status Partially Met    Target Date 03/17/20      PT LONG TERM GOAL #4   Title Pt. will report 4/10 low back pain at worst with no radicular symptoms to improve ADLs/ household chores.    Baseline Pt. reports no pain in back currently at rest but >6/10 pain with increase activity.    Time 12    Period Weeks    Status Partially Met    Target Date 03/17/20             Pt. entered PT with a consistent gait pattern while carrying purse/ water. Pt. has continued c/o generalized low back/ L hip/ ITB pain with light palpation. Pt. understands importance of staying active and focusing on posture/ core ex. program. Pt. has difficulty lying in prone position and prefers supine/ sidelying. Pt. hypersensitive with all palpation/ manual tx. techniques. Moderate lumbar hypomobility with cuing to correct seated/ thoracic posture. Pt. will continue with skilled PT services to focus on core/ postural strengthening and pain mgmt. with daily tasks.      Patient will benefit from skilled therapeutic intervention in order to improve the following deficits and impairments:  Abnormal gait, Pain, Improper body mechanics, Postural dysfunction, Decreased mobility, Decreased activity tolerance, Decreased endurance, Decreased range of motion, Decreased strength, Impaired UE functional use, Impaired flexibility, Difficulty walking  Visit Diagnosis: Chronic bilateral low back pain without sciatica  Muscle weakness (generalized)     Problem List Patient Active Problem List   Diagnosis Date Noted  . Multiple thyroid nodules 12/28/2019  . Thyroid nodule 09/29/2019  . Lymphedema 11/10/2018  . PAD (peripheral artery disease) ([place]) 10/12/2018  . Aortic atherosclerosis ([place]) 10/12/2018  . Carotid stenosis, asymptomatic, bilateral 10/12/2018  .  Positive colorectal cancer screening using Cologuard test 10/09/2018  . Gastroesophageal reflux disease   . Acute gastritis without hemorrhage   . Osteoarthritis 04/29/2018  . Hiatal hernia with GERD 04/29/2018  . Hyperlipidemia 04/29/2018  . Lymphedema of both lower extremities 04/29/2018  . Left knee pain 10/23/2016  . Cataracts, bilateral 07/31/2016  . Glaucoma 07/31/2016  . Lateral epicondylitis of left elbow 10/20/2015  . Vitamin D deficiency 10/20/2015  . Type 2 diabetes, controlled, with neuropathy ([place]) 10/16/2015  . Gouty arthritis 06/03/2015  . H/O syncope 06/03/2015  . Mild intermittent asthma 06/03/2015  . Multiple gastric ulcers 06/03/2015  . Schatzki's ring 06/03/2015  . Undifferentiated connective tissue disease ([place]) 06/03/2015  . Bone disorder 04/05/2015  . Dental injury 04/05/2015  . Pericarditis 04/05/2015  . Sjogren's  syndrome ([place]) 09/07/2014  . Elevated alkaline phosphatase level 08/24/2014  . Lactose intolerance 08/24/2014  . Obesity 08/24/2014  . Peripheral edema 08/24/2014  . 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 Abdominal adhesions 08/02/2014  . 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[place]) 08/02/2014  . Degenerative joint disease (DJD) of lumbar spine 08/02/2014  . Diverticulosis of both small and large intestine 08/02/2014  . Hyperuricemia 08/02/2014  . Pure hypercholesterolemia 08/02/2014  . Trigger finger 08/02/2014  . Right shoulder pain 07/29/2014  . Benign neoplasm of colon 06/30/2010  . Right upper quadrant pain 06/30/2010   Pura Spice, PT, DPT # [phone] 03/10/2020, 7:41 AM  [place] [facility] [address] [place], [place], [address] Phone: [phone]   Fax:  [phone]  Name: [name] MRN: [id number] Date of Birth: [birth year]

## 2020-03-07 ENCOUNTER — Telehealth: Payer: Self-pay

## 2020-03-07 ENCOUNTER — Ambulatory Visit: Payer: Medicare Other | Admitting: Occupational Therapy

## 2020-03-07 ENCOUNTER — Other Ambulatory Visit: Payer: Self-pay

## 2020-03-07 DIAGNOSIS — I890 Lymphedema, not elsewhere classified: Secondary | ICD-10-CM | POA: Diagnosis not present

## 2020-03-07 NOTE — Chronic Care Management (AMB) (Signed)
  Care Management   Note  03/07/2020 Name: [name] MRN: [id number] DOB: [birth year]  [name] is a 74 y.o. year old female who is a primary care patient of [name], [name], [place] and is actively engaged with the care management team. I reached out to [name] by phone today to assist with re-scheduling a follow up visit with the Pharmacist  Follow up plan: Unsuccessful telephone outreach attempt made. A HIPAA compliant phone message was left for the patient providing contact information and requesting a return call.  The care management team will reach out to the patient again over the next 7 days.  If patient returns call to provider office, please advise to call [facility]  at [place], [place], [place], [place] [address] Direct Dial: [phone] [email or web address]: [email or web address]

## 2020-03-07 NOTE — Therapy (Signed)
[place] MAIN [facility] SERVICES [address] [place], [place], [address] Phone: [phone]   Fax:  [phone]  Occupational Therapy Treatment  Patient Details  Name: [name] MRN: [id number] Date of Birth: 1946-01-16 Referring Provider (OT): Patricia Rogue, MD   Encounter Date: 03/07/2020   OT End of Session - 03/07/20 1506    Visit Number 5    Number of Visits 36    Date for OT Re-Evaluation 05/08/20    OT Start Time 0210    OT Stop Time 0305    OT Time Calculation (min) 55 min           Past Medical History:  Diagnosis Date  . Anterolisthesis    Cervical spine  . Asthma   . Connective tissue disorder (HCC)    recurrent carotid arteritis, temporal arteritis, vasculitis mandible, general hepatitis, avascular necrosis bilat  . DDD (degenerative disc disease), lumbar   . Diabetes mellitus without complication ([place])   . Diverticulosis   . Duodenitis   . Gouty arthritis   . Hiatal hernia with GERD   . History of cardiovascular stress test    a. 07/2018 [facility]): Fixed inferoapical defect w/ nl contraction-->attenuation artifact. No ischemia. EF 63%.  . Hyperlipidemia   . Hypertension    a. 02/2019 Renal artery duplex: no evidence of prox RAS.  Marland Kitchen Hyperuricemia   . Keratitis sicca, bilateral ([place])   . Lymphedema of both lower extremities   . Osteoarthritis   . Pericarditis    Recurrent: Aug 97, July 05, Sept 08, July 16, July 18  . PUD (peptic ulcer disease)   . 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[place])   . Sjogren's syndrome ([place])   . Syncope   . Tendonitis, Achilles, right   . Tortuous colon   . Trigger finger     Past Surgical History:  Procedure Laterality Date  . ABDOMINAL HYSTERECTOMY    . BREAST BIOPSY    . BREAST EXCISIONAL BIOPSY Left 1986   neg  . CHOLECYSTECTOMY    . COLONOSCOPY WITH PROPOFOL N/A 08/26/2018   Procedure: COLONOSCOPY WITH PROPOFOL;  Surgeon: Lucilla Lame, MD;  Location: [facility] ENDOSCOPY;  Service:  Endoscopy;  Laterality: N/A;  . CYSTOSCOPY  02/26/2018   [name], MD   . distal arthrectomy    . ESOPHAGOGASTRODUODENOSCOPY (EGD) WITH PROPOFOL N/A 08/26/2018   Procedure: ESOPHAGOGASTRODUODENOSCOPY (EGD) WITH PROPOFOL;  Surgeon: Lucilla Lame, MD;  Location: [facility] ENDOSCOPY;  Service: Endoscopy;  Laterality: N/A;  . EXCISION NEUROMA    . KNEE SURGERY Bilateral    1985, 2001, 11/2002, 12/2006  . panhysterectomy  10/1983  . SHOULDER SURGERY Right    reverse total arthroplasty w/ biceps tenodesis  . TONSILLECTOMY AND ADENOIDECTOMY    . TOTAL HIP ARTHROPLASTY Right 04/2007  . WISDOM TOOTH EXTRACTION      There were no vitals filed for this visit.   Subjective Assessment - 03/07/20 1411    Subjective  [name] presents for OT Rx visit 5 of 36 to address BLE lymphedema. Pt presents with compression wraps in place. Pt reports she wrapped every day during visit interval. Pt denies leg pain, but winces during MLD to L medial leg during MLD. "It's been painful there for years ever since the knee replacement."    Pertinent History PMHx extensive; Dx contributing to leg swelling include OA, B knee and R hip arthroplasties, asthma, HTN. Suspect LE venous insufficiency.    Limitations chronic pain, chronic leg swelling, generalized  weakness, kyphotic posture    Repetition Increases Symptoms    Special Tests + stemmer sign bilaterally base of toes    Patient Stated Goals Reduce LE edema    Pain Onset --   30 yrs                       OT Treatments/Exercises (OP) - 03/07/20 0001      ADLs   ADL Education Given Yes      Manual Therapy   Manual Therapy Edema management;Manual Lymphatic Drainage (MLD);Compression Bandaging    Manual therapy comments fibrosis techniques to distal leg, malleoli and foot.     Manual Lymphatic Drainage (MLD) MLD to LLE as established    Compression Bandaging multilayer gradient compression wraps from base f toes to tibial tuberosity using 8, 10  and 12 cm wise short stretch bandage over 0.4 cm thick Rosidal foam and cotton stockinett                  OT Education - 03/07/20 1504    Education Details Pt edu for lymphatic structure and function oin relation to MLD of LLE/LLQ. Pt able to perform J stroke after skilled edu. Pt did faily good job w compression wraps. Reviewed techniques to perfect gradient.    Person(s) Educated Patient    Methods Explanation;Demonstration;Handout    Comprehension Verbalized understanding;Returned demonstration               OT Long Term Goals - 02/09/20 1623      OT LONG TERM GOAL #1   Title With modified independent (extra time) Pt will be able to apply knee length, multi-layer, short stretch compression wraps daily from ankle to tibial tuberosity on one leg at a time using correct gradient techniques to return affected limb/s, as closely as possible, to premorbid size and shape, to limit leg pain and infection risk, and to improve safe functional mobility and ADLs performance.    Baseline Dependent    Time 4    Period Days    Status New    Target Date --   5th OT visit (4th OT Rx visit)     OT LONG TERM GOAL #2   Title Pt will be able to verbalize signs and symptoms of cellulitis infection and identify lymphedema precautions using printed resource (modified independence) for reference to decrease infection risk and limit LE progression over time.    Baseline Max A    Time 4    Period Days    Status New    Target Date --   5th OT visit (4th OT Rx visit)     OT LONG TERM GOAL #3   Title Pt will sustain a least 85% compliance with all daily LE self-care home program components throughout Intensive Phase CDT, including impeccable skin care, lymphatic pumping ther ex,  compression wraps and simple self MLD, to ensure optimal limb volume reduction, to limit infection risk and to limit LE progression.    Baseline dependent    Time 12    Period Weeks    Status New    Target Date  05/09/20      OT LONG TERM GOAL #4   Title Using assistive devices (modified independence)  Pt will be able to don and doff appropriate daytime compression garments to limit edema re-accumulation and further progression by issue date.    Baseline Max A    Time 12    Period Weeks  Status New    Target Date 05/09/20      OT LONG TERM GOAL #5   Title After skilled teaching Pt  will be able to perform all lymphedema self-care home program components with modified independence (extra time, assistive devices PRN) , including simple self MLD, lymphatic pumping exercises, don, doff and wear compression garments  daily, sustain   impeccable skin care daily, to limit lymphedema progression and infection risk.    Baseline Max A    Time 12    Period Weeks    Status New    Target Date 05/09/20      Long Term Additional Goals   Additional Long Term Goals Yes      OT LONG TERM GOAL #6   Title Pt will increase score on FOTO 10 points, from 45 to 55/100, to improve functional performance of basic ADLs.    Baseline Max A    Time 12    Period Weeks    Status New    Target Date 05/09/20                 Plan - 03/07/20 1703    Clinical Impression Statement Pt demonstrates excellent compliance thus far with compression wrapping LLE during visit intervals. Limb swelling is decreased above ankle, but pitting edema in dorsal foot and at [facility] is visible and palpable. Pt has increased pain at nedial knee and leg during MLD today whichh she describes as long term. No signs or symptoms of DVT or infection are observed. Suspect neurological  involvement or perhaps some sort of impingement? Cont as per POC. Pt tolerateing CDT well overall.           Patient will benefit from skilled therapeutic intervention in order to improve the following deficits and impairments:           Visit Diagnosis: Lymphedema, not elsewhere classified    Problem List Patient Active Problem List    Diagnosis Date Noted  . Multiple thyroid nodules 12/28/2019  . Thyroid nodule 09/29/2019  . Lymphedema 11/10/2018  . PAD (peripheral artery disease) ([place]) 10/12/2018  . Aortic atherosclerosis ([place]) 10/12/2018  . Carotid stenosis, asymptomatic, bilateral 10/12/2018  . Positive colorectal cancer screening using Cologuard test 10/09/2018  . Gastroesophageal reflux disease   . Acute gastritis without hemorrhage   . Osteoarthritis 04/29/2018  . Hiatal hernia with GERD 04/29/2018  . Hyperlipidemia 04/29/2018  . Lymphedema of both lower extremities 04/29/2018  . Left knee pain 10/23/2016  . Cataracts, bilateral 07/31/2016  . Glaucoma 07/31/2016  . Lateral epicondylitis of left elbow 10/20/2015  . Vitamin D deficiency 10/20/2015  . Type 2 diabetes, controlled, with neuropathy ([place]) 10/16/2015  . Gouty arthritis 06/03/2015  . H/O syncope 06/03/2015  . Mild intermittent asthma 06/03/2015  . Multiple gastric ulcers 06/03/2015  . Schatzki's ring 06/03/2015  . Undifferentiated connective tissue disease ([place]) 06/03/2015  . Bone disorder 04/05/2015  . Dental injury 04/05/2015  . Pericarditis 04/05/2015  . Sjogren's syndrome ([place]) 09/07/2014  . Elevated alkaline phosphatase level 08/24/2014  . Lactose intolerance 08/24/2014  . Obesity 08/24/2014  . Peripheral edema 08/24/2014  . 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 Abdominal adhesions 08/02/2014  . 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[place]) 08/02/2014  . Degenerative joint disease (DJD) of lumbar spine 08/02/2014  . Diverticulosis of both small and large intestine 08/02/2014  . Hyperuricemia 08/02/2014  . Pure hypercholesterolemia 08/02/2014  . Trigger finger 08/02/2014  .  Right shoulder pain 07/29/2014  . Benign neoplasm of colon 06/30/2010  . Right upper quadrant pain 06/30/2010    Andrey Spearman, MS, OTR/L, [facility] 03/07/20 5:08 PM   [place] MAIN  [facility] SERVICES [address] [place], [place], [address] Phone: [phone]   Fax:  [phone]  Name: [name] MRN: [id number] Date of Birth: 1945-11-27

## 2020-03-09 ENCOUNTER — Ambulatory Visit: Payer: Medicare Other | Admitting: Occupational Therapy

## 2020-03-09 ENCOUNTER — Telehealth: Payer: Self-pay

## 2020-03-09 ENCOUNTER — Other Ambulatory Visit: Payer: Self-pay

## 2020-03-09 DIAGNOSIS — I890 Lymphedema, not elsewhere classified: Secondary | ICD-10-CM | POA: Diagnosis not present

## 2020-03-09 NOTE — Therapy (Signed)
[place] MAIN [facility] SERVICES [place], [place], [address] Phone: [phone]   Fax:  [phone]  Occupational Therapy Treatment  Patient Details  Name: [name] MRN: [id number] Date of Birth: [birth year] Referring Provider (OT): Ida Rogue, MD   Encounter Date: 03/09/2020    Past Medical History:  Diagnosis Date  . Anterolisthesis    Cervical spine  . Asthma   . Connective tissue disorder (HCC)    recurrent carotid arteritis, temporal arteritis, vasculitis mandible, general hepatitis, avascular necrosis bilat  . DDD (degenerative disc disease), lumbar   . Diabetes mellitus without complication ([place])   . Diverticulosis   . Duodenitis   . Gouty arthritis   . Hiatal hernia with GERD   . History of cardiovascular stress test    a. 07/2018 [facility]): Fixed inferoapical defect w/ nl contraction-->attenuation artifact. No ischemia. EF 63%.  . Hyperlipidemia   . Hypertension    a. 02/2019 Renal artery duplex: no evidence of prox RAS.  Marland Kitchen Hyperuricemia   . Keratitis sicca, bilateral ([place])   . Lymphedema of both lower extremities   . Osteoarthritis   . Pericarditis    Recurrent: Aug 97, July 05, Sept 08, July 16, July 18  . PUD (peptic ulcer disease)   . 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[place])   . Sjogren's syndrome ([place])   . Syncope   . Tendonitis, Achilles, right   . Tortuous colon   . Trigger finger     Past Surgical History:  Procedure Laterality Date  . ABDOMINAL HYSTERECTOMY    . BREAST BIOPSY    . BREAST EXCISIONAL BIOPSY Left 1986   neg  . CHOLECYSTECTOMY    . COLONOSCOPY WITH PROPOFOL N/A 08/26/2018   Procedure: COLONOSCOPY WITH PROPOFOL;  Surgeon: Lucilla Lame, MD;  Location: [facility] ENDOSCOPY;  Service: Endoscopy;  Laterality: N/A;  . CYSTOSCOPY  02/26/2018   [name], MD   . distal arthrectomy    . ESOPHAGOGASTRODUODENOSCOPY (EGD) WITH PROPOFOL N/A 08/26/2018   Procedure: ESOPHAGOGASTRODUODENOSCOPY  (EGD) WITH PROPOFOL;  Surgeon: Lucilla Lame, MD;  Location: [facility] ENDOSCOPY;  Service: Endoscopy;  Laterality: N/A;  . EXCISION NEUROMA    . KNEE SURGERY Bilateral    1985, 2001, 11/2002, 12/2006  . panhysterectomy  10/1983  . SHOULDER SURGERY Right    reverse total arthroplasty w/ biceps tenodesis  . TONSILLECTOMY AND ADENOIDECTOMY    . TOTAL HIP ARTHROPLASTY Right 04/2007  . WISDOM TOOTH EXTRACTION      There were no vitals filed for this visit.   Subjective Assessment - 03/09/20 1402    Subjective  [name] presents for OT Rx visit 6 of 36 to address BLE lymphedema. Pt presents in transport wc again today, with compression wraps in place. Pt reports0/10 leg pain. Pt reports 8/10 back pain today.    Pertinent History PMHx extensive; Dx contributing to leg swelling include OA, B knee and R hip arthroplasties, asthma, HTN. Suspect LE venous insufficiency.    Limitations chronic pain, chronic leg swelling, generalized weakness, kyphotic posture    Repetition Increases Symptoms    Special Tests + stemmer sign bilaterally base of toes    Patient Stated Goals Reduce LE edema    Pain Onset --   30 yrs                       OT Treatments/Exercises (OP) - 03/09/20 0001      ADLs   ADL Education Given  Yes      Manual Therapy   Manual Therapy Edema management;Manual Lymphatic Drainage (MLD)    Manual Lymphatic Drainage (MLD) MLD to LLE as established    Compression Bandaging Added custom fabricated foam pad to dorsal foot multilayer gradient compression wraps from base f toes to tibial tuberosity using 8, 10 and 12 cm wise short stretch bandage over 0.4 cm thick Rosidal foam and cotton stockinett                  OT Education - 03/09/20 1408    Education Details Continued skilled Pt/caregiver education  And LE ADL training throughout visit for lymphedema self care/ home program, including compression wrapping, compression garment and device wear/care, lymphatic  pumping ther ex, simple self-MLD, and skin care. Discussed progress towards goals.    Person(s) Educated Patient    Methods Explanation;Demonstration;Handout    Comprehension Verbalized understanding;Returned demonstration               OT Long Term Goals - 02/09/20 1623      OT LONG TERM GOAL #1   Title With modified independent (extra time) Pt will be able to apply knee length, multi-layer, short stretch compression wraps daily from ankle to tibial tuberosity on one leg at a time using correct gradient techniques to return affected limb/s, as closely as possible, to premorbid size and shape, to limit leg pain and infection risk, and to improve safe functional mobility and ADLs performance.    Baseline Dependent    Time 4    Period Days    Status New    Target Date --   5th OT visit (4th OT Rx visit)     OT LONG TERM GOAL #2   Title Pt will be able to verbalize signs and symptoms of cellulitis infection and identify lymphedema precautions using printed resource (modified independence) for reference to decrease infection risk and limit LE progression over time.    Baseline Max A    Time 4    Period Days    Status New    Target Date --   5th OT visit (4th OT Rx visit)     OT LONG TERM GOAL #3   Title Pt will sustain a least 85% compliance with all daily LE self-care home program components throughout Intensive Phase CDT, including impeccable skin care, lymphatic pumping ther ex,  compression wraps and simple self MLD, to ensure optimal limb volume reduction, to limit infection risk and to limit LE progression.    Baseline dependent    Time 12    Period Weeks    Status New    Target Date 05/09/20      OT LONG TERM GOAL #4   Title Using assistive devices (modified independence)  Pt will be able to don and doff appropriate daytime compression garments to limit edema re-accumulation and further progression by issue date.    Baseline Max A    Time 12    Period Weeks    Status New     Target Date 05/09/20      OT LONG TERM GOAL #5   Title After skilled teaching Pt  will be able to perform all lymphedema self-care home program components with modified independence (extra time, assistive devices PRN) , including simple self MLD, lymphatic pumping exercises, don, doff and wear compression garments  daily, sustain   impeccable skin care daily, to limit lymphedema progression and infection risk.    Baseline Max A  Time 12    Period Weeks    Status New    Target Date 05/09/20      Long Term Additional Goals   Additional Long Term Goals Yes      OT LONG TERM GOAL #6   Title Pt will increase score on FOTO 10 points, from 45 to 55/100, to improve functional performance of basic ADLs.    Baseline Max A    Time 12    Period Weeks    Status New    Target Date 05/09/20                 Plan - 03/09/20 1501    Clinical Impression Statement Pt not applying compression wraps correctly , so we spent the last 15 minutes af our 60 minute session reviewing gradient technique. Pt tolerated MLD without difficulty today. Swelling  is slow to respond  to date. Cont as per POC.           Patient will benefit from skilled therapeutic intervention in order to improve the following deficits and impairments:           Visit Diagnosis: Lymphedema, not elsewhere classified    Problem List Patient Active Problem List   Diagnosis Date Noted  . Multiple thyroid nodules 12/28/2019  . Thyroid nodule 09/29/2019  . Lymphedema 11/10/2018  . PAD (peripheral artery disease) ([place]) 10/12/2018  . Aortic atherosclerosis ([place]) 10/12/2018  . Carotid stenosis, asymptomatic, bilateral 10/12/2018  . Positive colorectal cancer screening using Cologuard test 10/09/2018  . Gastroesophageal reflux disease   . Acute gastritis without hemorrhage   . Osteoarthritis 04/29/2018  . Hiatal hernia with GERD 04/29/2018  . Hyperlipidemia 04/29/2018  . Lymphedema of both lower extremities  04/29/2018  . Left knee pain 10/23/2016  . Cataracts, bilateral 07/31/2016  . Glaucoma 07/31/2016  . Lateral epicondylitis of left elbow 10/20/2015  . Vitamin D deficiency 10/20/2015  . Type 2 diabetes, controlled, with neuropathy ([place]) 10/16/2015  . Gouty arthritis 06/03/2015  . H/O syncope 06/03/2015  . Mild intermittent asthma 06/03/2015  . Multiple gastric ulcers 06/03/2015  . Schatzki's ring 06/03/2015  . Undifferentiated connective tissue disease ([place]) 06/03/2015  . Bone disorder 04/05/2015  . Dental injury 04/05/2015  . Pericarditis 04/05/2015  . Sjogren's syndrome ([place]) 09/07/2014  . Elevated alkaline phosphatase level 08/24/2014  . Lactose intolerance 08/24/2014  . Obesity 08/24/2014  . Peripheral edema 08/24/2014  . 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 Abdominal adhesions 08/02/2014  . 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[place]) 08/02/2014  . Degenerative joint disease (DJD) of lumbar spine 08/02/2014  . Diverticulosis of both small and large intestine 08/02/2014  . Hyperuricemia 08/02/2014  . Pure hypercholesterolemia 08/02/2014  . Trigger finger 08/02/2014  . Right shoulder pain 07/29/2014  . Benign neoplasm of colon 06/30/2010  . Right upper quadrant pain 06/30/2010    Andrey Spearman, MS, OTR/L, [facility] 03/09/20 3:03 PM  [place] MAIN [facility] SERVICES [address] [place], [place], [address] Phone: [phone]   Fax:  [phone]  Name: [name] MRN: [id number] Date of Birth: [birth year]

## 2020-03-10 ENCOUNTER — Other Ambulatory Visit: Payer: Self-pay

## 2020-03-10 ENCOUNTER — Encounter: Payer: Self-pay | Admitting: Physical Therapy

## 2020-03-10 ENCOUNTER — Ambulatory Visit: Payer: Medicare Other

## 2020-03-10 ENCOUNTER — Telehealth: Payer: Self-pay

## 2020-03-10 VITALS — BP 153/63

## 2020-03-10 DIAGNOSIS — I890 Lymphedema, not elsewhere classified: Secondary | ICD-10-CM

## 2020-03-10 DIAGNOSIS — M545 Low back pain: Secondary | ICD-10-CM | POA: Diagnosis not present

## 2020-03-10 DIAGNOSIS — G8929 Other chronic pain: Secondary | ICD-10-CM | POA: Diagnosis not present

## 2020-03-10 DIAGNOSIS — M6281 Muscle weakness (generalized): Secondary | ICD-10-CM

## 2020-03-10 NOTE — Telephone Encounter (Signed)
I contacted Production manager and they informed me that they don't do testing for the reactions to COVID vaccine. She needs to be seen at [facility].

## 2020-03-10 NOTE — Telephone Encounter (Signed)
Copied from [place] [id number]. Topic: General - Other >> Mar 10, 2020  2:00 PM Leward Quan A wrote: Reason for CRM: Patient called to inquire of a referral that was previously discussed for her to see an allergist. Per patient she was told if she did not hear anything by 03/09/20 to call back so she would like to know the status please. Per patient if there is no answer feel free to leave a detailed message. Thank you can be reached at  Ph# [phone]

## 2020-03-10 NOTE — Therapy (Signed)
[facility] [address] [place], [place], [address] Phone: [phone]   Fax:  [phone]  Physical Therapy Treatment  Patient Details  Name: [name] MRN: [id number] Date of Birth: 02-18-1946 Referring Provider (PT): Dr. Parks Ranger   Encounter Date: 03/10/2020   PT End of Session - 03/10/20 1437    Visit Number 17    Number of Visits 24    Date for PT Re-Evaluation 03/17/20    Authorization - Visit Number 5    Authorization - Number of Visits 10    PT Start Time 1430    PT Stop Time 5597    PT Time Calculation (min) 47 min    Activity Tolerance Patient tolerated treatment well;Patient limited by pain    Behavior During Therapy [facility] for tasks assessed/performed           Past Medical History:  Diagnosis Date   Anterolisthesis    Cervical spine   Asthma    Connective tissue disorder ([place])    recurrent carotid arteritis, temporal arteritis, vasculitis mandible, general hepatitis, avascular necrosis bilat   DDD (degenerative disc disease), lumbar    Diabetes mellitus without complication ([place])    Diverticulosis    Duodenitis    Gouty arthritis    Hiatal hernia with GERD    History of cardiovascular stress test    a. 07/2018 [facility]): Fixed inferoapical defect w/ nl contraction-->attenuation artifact. No ischemia. EF 63%.   Hyperlipidemia    Hypertension    a. 02/2019 Renal artery duplex: no evidence of prox RAS.   Hyperuricemia    Keratitis sicca, bilateral (HCC)    Lymphedema of both lower extremities    Osteoarthritis    Pericarditis    Recurrent: Aug 97, July 05, Sept 08, July 16, July 18   PUD (peptic ulcer disease)    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[place])    Sjogren's syndrome ([place])    Syncope    Tendonitis, Achilles, right    Tortuous colon    Trigger finger     Past Surgical History:  Procedure Laterality Date   ABDOMINAL HYSTERECTOMY     BREAST BIOPSY     BREAST  EXCISIONAL BIOPSY Left 1986   neg   CHOLECYSTECTOMY     COLONOSCOPY WITH PROPOFOL N/A 08/26/2018   Procedure: COLONOSCOPY WITH PROPOFOL;  Surgeon: Lucilla Lame, MD;  Location: [facility] ENDOSCOPY;  Service: Endoscopy;  Laterality: N/A;   CYSTOSCOPY  02/26/2018   [name], MD    distal arthrectomy     ESOPHAGOGASTRODUODENOSCOPY (EGD) WITH PROPOFOL N/A 08/26/2018   Procedure: ESOPHAGOGASTRODUODENOSCOPY (EGD) WITH PROPOFOL;  Surgeon: Lucilla Lame, MD;  Location: [facility] ENDOSCOPY;  Service: Endoscopy;  Laterality: N/A;   EXCISION NEUROMA     KNEE SURGERY Bilateral    1985, 2001, 11/2002, 12/2006   panhysterectomy  10/1983   SHOULDER SURGERY Right    reverse total arthroplasty w/ biceps tenodesis   TONSILLECTOMY AND ADENOIDECTOMY     TOTAL HIP ARTHROPLASTY Right 04/2007   WISDOM TOOTH EXTRACTION      Vitals:   03/10/20 1528  BP: (!) 153/63     Subjective Assessment - 03/10/20 1435    Subjective Pt. reports that low back pain and hip are about the same. Pt. presents wtih lower leg lymphedema wrapping. Pt. continues to work with Helene Kelp at [facility] 2x/week for lymph massage/wrapping. BP: 153/63    Pertinent History Pt. from [place] and came to [place] to take care of friend and then  Covid happened.  Pt. retired.  Pts. ex-husband lives in her house in [place].  R shoulder limitations (5# lifting restrictions).  Pt. went to [facility] for 10 years (every week) with benefits reported in low back.    Currently in Pain? Yes   no pain score given            Manual:   PT performed bilat STM to lumbar paraspinals and muscles above posterior iliac crest in supine. PT felt two large knots right below posterior iliac crest on both sides. Pt responded well to manual tx, PT felt knots feel looser by the end of session. Pt did not give any pain score for before/after tx.                              PT Long Term Goals - 12/25/19 1313      PT LONG TERM GOAL #1   Title Pt. will be  independent with HEP to increase B hip/LE muscle strengthening 1/2 muscle grade to improve pain-free mobility/ upright posture.    Baseline Generalized B LE muscle weakness grossly 4/5 MMT (pain limited L hip/ R knee).    Time 8    Period Weeks    Status Not Met    Target Date 03/17/20      PT LONG TERM GOAL #2   Title Pt. will increase FOTO to 58 to improve pain-free mobility.    Baseline Initial FOTO: 53    Time 12    Period Weeks    Status On-going    Target Date 03/17/20      PT LONG TERM GOAL #3   Title Pt. will demonstrate/ maintain proper upright posture with standing tolerance with no increase c/o back pain to improve cooking.    Baseline Moderate rounded shoulders/ forward head posture.    Time 12    Period Weeks    Status Partially Met    Target Date 03/17/20      PT LONG TERM GOAL #4   Title Pt. will report 4/10 low back pain at worst with no radicular symptoms to improve ADLs/ household chores.    Baseline Pt. reports no pain in back currently at rest but >6/10 pain with increase activity.    Time 12    Period Weeks    Status Partially Met    Target Date 03/17/20                 Plan - 03/10/20 1544    Clinical Impression Statement Pt. entered clinic with lymphedema wrap and surgical shoe on L foot. PT performed STM to bilat lumbar paraspinals and to muslces just below posterior iliac crests. PT felt large knots in aformentioned areas, felt looser by the end of session. Pt. will continue with skilled PT services to focus on core/ postural strengthening and pain mgmt. with daily tasks.    Personal Factors and Comorbidities Education    Stability/Clinical Decision Making Evolving/Moderate complexity    Clinical Decision Making Moderate    Rehab Potential Fair    PT Frequency 1x / week    PT Duration 12 weeks    PT Treatment/Interventions ADLs/Self Care Home Management;Electrical Stimulation;Moist Heat;Gait training;Stair training;Functional mobility  training;Neuromuscular re-education;Balance training;Therapeutic exercise;Therapeutic activities;Patient/family education;Manual techniques;Passive range of motion    PT Next Visit Plan Recheck BP.  Focus on STM to lumbar/ B hips and pain mgmt.    Consulted and Agree with Plan of  Care Patient           Patient will benefit from skilled therapeutic intervention in order to improve the following deficits and impairments:  Abnormal gait, Pain, Improper body mechanics, Postural dysfunction, Decreased mobility, Decreased activity tolerance, Decreased endurance, Decreased range of motion, Decreased strength, Impaired UE functional use, Impaired flexibility, Difficulty walking  Visit Diagnosis: Lymphedema, not elsewhere classified  Chronic bilateral low back pain without sciatica  Muscle weakness (generalized)     Problem List Patient Active Problem List   Diagnosis Date Noted   Multiple thyroid nodules 12/28/2019   Thyroid nodule 09/29/2019   Lymphedema 11/10/2018   PAD (peripheral artery disease) ([place]) 10/12/2018   Aortic atherosclerosis ([place]) 10/12/2018   Carotid stenosis, asymptomatic, bilateral 10/12/2018   Positive colorectal cancer screening using Cologuard test 10/09/2018   Gastroesophageal reflux disease    Acute gastritis without hemorrhage    Osteoarthritis 04/29/2018   Hiatal hernia with GERD 04/29/2018   Hyperlipidemia 04/29/2018   Lymphedema of both lower extremities 04/29/2018   Left knee pain 10/23/2016   Cataracts, bilateral 07/31/2016   Glaucoma 07/31/2016   Lateral epicondylitis of left elbow 10/20/2015   Vitamin D deficiency 10/20/2015   Type 2 diabetes, controlled, with neuropathy ([place]) 10/16/2015   Gouty arthritis 06/03/2015   H/O syncope 06/03/2015   Mild intermittent asthma 06/03/2015   Multiple gastric ulcers 06/03/2015   Schatzki's ring 06/03/2015   Undifferentiated connective tissue disease ([place]) 06/03/2015   Bone disorder  04/05/2015   Dental injury 04/05/2015   Pericarditis 04/05/2015   Sjogren's syndrome ([place]) 09/07/2014   Elevated alkaline phosphatase level 08/24/2014   Lactose intolerance 08/24/2014   Obesity 08/24/2014   Peripheral edema 08/24/2014   Benign hypertension with CKD (chronic kidney disease) stage III 08/03/2014   Abdominal adhesions 08/02/2014   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[place]) 08/02/2014   Degenerative joint disease (DJD) of lumbar spine 08/02/2014   Diverticulosis of both small and large intestine 08/02/2014   Hyperuricemia 08/02/2014   Pure hypercholesterolemia 08/02/2014   Trigger finger 08/02/2014   Right shoulder pain 07/29/2014   Benign neoplasm of colon 06/30/2010   Right upper quadrant pain 06/30/2010    Carlyle Basques, SPT 03/10/2020, 3:48 PM  [facility] [address] [place], [place], [address] Phone: [phone]   Fax:  [phone]  Name: Jancy Sprankle MRN: [id number] Date of Birth: [birth year]

## 2020-03-14 ENCOUNTER — Ambulatory Visit: Payer: Medicare Other | Attending: Cardiovascular Disease | Admitting: Occupational Therapy

## 2020-03-14 ENCOUNTER — Other Ambulatory Visit: Payer: Self-pay

## 2020-03-14 DIAGNOSIS — I890 Lymphedema, not elsewhere classified: Secondary | ICD-10-CM | POA: Insufficient documentation

## 2020-03-14 NOTE — Therapy (Signed)
[place] MAIN [facility] SERVICES [address] [place], [place], [address] Phone: [phone]   Fax:  [phone]  Occupational Therapy Treatment  Patient Details  Name: [name] MRN: [id number] Date of Birth: 07/08/1945 Referring Provider (OT): Ida Rogue, MD   Encounter Date: 03/14/2020   OT End of Session - 03/14/20 1647    Visit Number 7    Number of Visits 36    Date for OT Re-Evaluation 05/08/20    OT Start Time 0215    OT Stop Time 0307    OT Time Calculation (min) 52 min           Past Medical History:  Diagnosis Date  . Anterolisthesis    Cervical spine  . Asthma   . Connective tissue disorder (HCC)    recurrent carotid arteritis, temporal arteritis, vasculitis mandible, general hepatitis, avascular necrosis bilat  . DDD (degenerative disc disease), lumbar   . Diabetes mellitus without complication ([place])   . Diverticulosis   . Duodenitis   . Gouty arthritis   . Hiatal hernia with GERD   . History of cardiovascular stress test    a. 07/2018 [facility]): Fixed inferoapical defect w/ nl contraction-->attenuation artifact. No ischemia. EF 63%.  . Hyperlipidemia   . Hypertension    a. 02/2019 Renal artery duplex: no evidence of prox RAS.  Marland Kitchen Hyperuricemia   . Keratitis sicca, bilateral ([place])   . Lymphedema of both lower extremities   . Osteoarthritis   . Pericarditis    Recurrent: Aug 97, July 05, Sept 08, July 16, July 18  . PUD (peptic ulcer disease)   . 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[place])   . Sjogren's syndrome ([place])   . Syncope   . Tendonitis, Achilles, right   . Tortuous colon   . Trigger finger     Past Surgical History:  Procedure Laterality Date  . ABDOMINAL HYSTERECTOMY    . BREAST BIOPSY    . BREAST EXCISIONAL BIOPSY Left 1986   neg  . CHOLECYSTECTOMY    . COLONOSCOPY WITH PROPOFOL N/A 08/26/2018   Procedure: COLONOSCOPY WITH PROPOFOL;  Surgeon: Lucilla Lame, MD;  Location: [facility] ENDOSCOPY;  Service:  Endoscopy;  Laterality: N/A;  . CYSTOSCOPY  02/26/2018   Patricia Puls, MD   . distal arthrectomy    . ESOPHAGOGASTRODUODENOSCOPY (EGD) WITH PROPOFOL N/A 08/26/2018   Procedure: ESOPHAGOGASTRODUODENOSCOPY (EGD) WITH PROPOFOL;  Surgeon: Lucilla Lame, MD;  Location: [facility] ENDOSCOPY;  Service: Endoscopy;  Laterality: N/A;  . EXCISION NEUROMA    . KNEE SURGERY Bilateral    1985, 2001, 11/2002, 12/2006  . panhysterectomy  10/1983  . SHOULDER SURGERY Right    reverse total arthroplasty w/ biceps tenodesis  . TONSILLECTOMY AND ADENOIDECTOMY    . TOTAL HIP ARTHROPLASTY Right 04/2007  . WISDOM TOOTH EXTRACTION      There were no vitals filed for this visit.   Subjective Assessment - 03/14/20 1634    Subjective  [name] presents for OT Rx visit 7 of 36 to address BLE lymphedema. Pt presents in transport wc with LLE knee length compression wraps in place.Pt has no new complaints. She compalains if hip pain today when walking and sitting on treatment surface. No numerical rating for pain given.    Pertinent History PMHx extensive; Dx contributing to leg swelling include OA, B knee and R hip arthroplasties, asthma, HTN. Suspect LE venous insufficiency.    Limitations chronic pain, chronic leg swelling, generalized weakness, kyphotic posture  Repetition Increases Symptoms    Special Tests + stemmer sign bilaterally base of toes    Patient Stated Goals Reduce LE edema    Pain Onset --   30 yrs                       OT Treatments/Exercises (OP) - 03/14/20 0001      ADLs   ADL Education Given Yes      Manual Therapy   Manual Therapy Edema management;Manual Lymphatic Drainage (MLD);Compression Bandaging;Myofascial release    Manual Lymphatic Drainage (MLD) MLD to LLE as established    Compression Bandaging LLE short stretch knee length wraps as established                  OT Education - 03/14/20 1646    Education Details Continued skilled Pt/caregiver education  And  LE ADL training throughout visit for lymphedema self care/ home program, including compression wrapping, compression garment and device wear/care, lymphatic pumping ther ex, simple self-MLD, and skin care. Discussed progress towards goals.    Person(s) Educated Patient    Methods Explanation;Demonstration;Handout    Comprehension Verbalized understanding;Returned demonstration               OT Long Term Goals - 02/09/20 1623      OT LONG TERM GOAL #1   Title With modified independent (extra time) Pt will be able to apply knee length, multi-layer, short stretch compression wraps daily from ankle to tibial tuberosity on one leg at a time using correct gradient techniques to return affected limb/s, as closely as possible, to premorbid size and shape, to limit leg pain and infection risk, and to improve safe functional mobility and ADLs performance.    Baseline Dependent    Time 4    Period Days    Status New    Target Date --   5th OT visit (4th OT Rx visit)     OT LONG TERM GOAL #2   Title Pt will be able to verbalize signs and symptoms of cellulitis infection and identify lymphedema precautions using printed resource (modified independence) for reference to decrease infection risk and limit LE progression over time.    Baseline Max A    Time 4    Period Days    Status New    Target Date --   5th OT visit (4th OT Rx visit)     OT LONG TERM GOAL #3   Title Pt will sustain a least 85% compliance with all daily LE self-care home program components throughout Intensive Phase CDT, including impeccable skin care, lymphatic pumping ther ex,  compression wraps and simple self MLD, to ensure optimal limb volume reduction, to limit infection risk and to limit LE progression.    Baseline dependent    Time 12    Period Weeks    Status New    Target Date 05/09/20      OT LONG TERM GOAL #4   Title Using assistive devices (modified independence)  Pt will be able to don and doff appropriate  daytime compression garments to limit edema re-accumulation and further progression by issue date.    Baseline Max A    Time 12    Period Weeks    Status New    Target Date 05/09/20      OT LONG TERM GOAL #5   Title After skilled teaching Pt  will be able to perform all lymphedema self-care home program components with  modified independence (extra time, assistive devices PRN) , including simple self MLD, lymphatic pumping exercises, don, doff and wear compression garments  daily, sustain   impeccable skin care daily, to limit lymphedema progression and infection risk.    Baseline Max A    Time 12    Period Weeks    Status New    Target Date 05/09/20      Long Term Additional Goals   Additional Long Term Goals Yes      OT LONG TERM GOAL #6   Title Pt will increase score on FOTO 10 points, from 45 to 55/100, to improve functional performance of basic ADLs.    Baseline Max A    Time 12    Period Weeks    Status New    Target Date 05/09/20                 Plan - 03/14/20 1647    Clinical Impression Statement Pt did an excellent job with cmpression wraps application today. LLE swelling is significantly decreased below the knee, except for at dorsal foot, where Pt is pushing excess fluid with tighter  than necessary wrap at ankle joint. Continue to educate for fine tuning wraps application. Pt tolerating al aspects of CDT without increased pain. Cont as per POC.           Patient will benefit from skilled therapeutic intervention in order to improve the following deficits and impairments:           Visit Diagnosis: Lymphedema, not elsewhere classified    Problem List Patient Active Problem List   Diagnosis Date Noted  . Multiple thyroid nodules 12/28/2019  . Thyroid nodule 09/29/2019  . Lymphedema 11/10/2018  . PAD (peripheral artery disease) ([place]) 10/12/2018  . Aortic atherosclerosis ([place]) 10/12/2018  . Carotid stenosis, asymptomatic, bilateral 10/12/2018  .  Positive colorectal cancer screening using Cologuard test 10/09/2018  . Gastroesophageal reflux disease   . Acute gastritis without hemorrhage   . Osteoarthritis 04/29/2018  . Hiatal hernia with GERD 04/29/2018  . Hyperlipidemia 04/29/2018  . Lymphedema of both lower extremities 04/29/2018  . Left knee pain 10/23/2016  . Cataracts, bilateral 07/31/2016  . Glaucoma 07/31/2016  . Lateral epicondylitis of left elbow 10/20/2015  . Vitamin D deficiency 10/20/2015  . Type 2 diabetes, controlled, with neuropathy ([place]) 10/16/2015  . Gouty arthritis 06/03/2015  . H/O syncope 06/03/2015  . Mild intermittent asthma 06/03/2015  . Multiple gastric ulcers 06/03/2015  . Schatzki's ring 06/03/2015  . Undifferentiated connective tissue disease ([place]) 06/03/2015  . Bone disorder 04/05/2015  . Dental injury 04/05/2015  . Pericarditis 04/05/2015  . Sjogren's syndrome ([place]) 09/07/2014  . Elevated alkaline phosphatase level 08/24/2014  . Lactose intolerance 08/24/2014  . Obesity 08/24/2014  . Peripheral edema 08/24/2014  . 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 Abdominal adhesions 08/02/2014  . 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[place]) 08/02/2014  . Degenerative joint disease (DJD) of lumbar spine 08/02/2014  . Diverticulosis of both small and large intestine 08/02/2014  . Hyperuricemia 08/02/2014  . Pure hypercholesterolemia 08/02/2014  . Trigger finger 08/02/2014  . Right shoulder pain 07/29/2014  . Benign neoplasm of colon 06/30/2010  . Right upper quadrant pain 06/30/2010    Andrey Spearman, MS, OTR/L, [facility] 03/14/20 4:51 PM  [place] MAIN [facility] SERVICES [address] [place], [place], [address] Phone: [phone]   Fax:  [phone]  Name: Eriyanna Kofoed  MRN: [id number] Date of Birth: [birth year]

## 2020-03-15 NOTE — Chronic Care Management (AMB) (Signed)
  Care Management   Note  03/15/2020 Name: Tahjanae Blankenburg MRN: [id number] DOB: 07-Oct-1945  [name] is a 74 y.o. year old female who is a primary care patient of Malfi, [name], FNP and is actively engaged with the care management team. I reached out to [name] by phone today to assist with re-scheduling a follow up visit with the Pharmacist  Follow up plan: Telephone appointment with care management team member scheduled for:04/11/2020   Noreene Larsson, [address], [place], [place] [address] Direct Dial: [phone] [email or web address]

## 2020-03-15 NOTE — Telephone Encounter (Signed)
Pt has been r/s  

## 2020-03-16 ENCOUNTER — Other Ambulatory Visit: Payer: Self-pay

## 2020-03-16 ENCOUNTER — Ambulatory Visit: Payer: Medicare Other | Admitting: Occupational Therapy

## 2020-03-16 DIAGNOSIS — I890 Lymphedema, not elsewhere classified: Secondary | ICD-10-CM

## 2020-03-16 NOTE — Therapy (Signed)
[place] MAIN [facility] SERVICES [address] [place], [place], [address] Phone: [phone]   Fax:  [phone]  Occupational Therapy Treatment  Patient Details  Name: [name] MRN: [id number] Date of Birth: [birth year] Referring Provider (OT): Ida Rogue, MD   Encounter Date: 03/16/2020   OT End of Session - 03/16/20 1458    Visit Number 8    Number of Visits 36    Date for OT Re-Evaluation 05/08/20    OT Start Time 0205    OT Stop Time 0305    OT Time Calculation (min) 60 min           Past Medical History:  Diagnosis Date  . Anterolisthesis    Cervical spine  . Asthma   . Connective tissue disorder (HCC)    recurrent carotid arteritis, temporal arteritis, vasculitis mandible, general hepatitis, avascular necrosis bilat  . DDD (degenerative disc disease), lumbar   . Diabetes mellitus without complication ([place])   . Diverticulosis   . Duodenitis   . Gouty arthritis   . Hiatal hernia with GERD   . History of cardiovascular stress test    a. 07/2018 [facility]): Fixed inferoapical defect w/ nl contraction-->attenuation artifact. No ischemia. EF 63%.  . Hyperlipidemia   . Hypertension    a. 02/2019 Renal artery duplex: no evidence of prox RAS.  Marland Kitchen Hyperuricemia   . Keratitis sicca, bilateral ([place])   . Lymphedema of both lower extremities   . Osteoarthritis   . Pericarditis    Recurrent: Aug 97, July 05, Sept 08, July 16, July 18  . PUD (peptic ulcer disease)   . 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[place])   . Sjogren's syndrome ([place])   . Syncope   . Tendonitis, Achilles, right   . Tortuous colon   . Trigger finger     Past Surgical History:  Procedure Laterality Date  . ABDOMINAL HYSTERECTOMY    . BREAST BIOPSY    . BREAST EXCISIONAL BIOPSY Left 1986   neg  . CHOLECYSTECTOMY    . COLONOSCOPY WITH PROPOFOL N/A 08/26/2018   Procedure: COLONOSCOPY WITH PROPOFOL;  Surgeon: Lucilla Lame, MD;  Location[facility] ENDOSCOPY;  Service:  Endoscopy;  Laterality: N/A;  . CYSTOSCOPY  02/26/2018   [name], MD   . distal arthrectomy    . ESOPHAGOGASTRODUODENOSCOPY (EGD) WITH PROPOFOL N/A 08/26/2018   Procedure: ESOPHAGOGASTRODUODENOSCOPY (EGD) WITH PROPOFOL;  Surgeon: Lucilla Lame, MD;  Location: [facility] ENDOSCOPY;  Service: Endoscopy;  Laterality: N/A;  . EXCISION NEUROMA    . KNEE SURGERY Bilateral    1985, 2001, 11/2002, 12/2006  . panhysterectomy  10/1983  . SHOULDER SURGERY Right    reverse total arthroplasty w/ biceps tenodesis  . TONSILLECTOMY AND ADENOIDECTOMY    . TOTAL HIP ARTHROPLASTY Right 04/2007  . WISDOM TOOTH EXTRACTION      There were no vitals filed for this visit.   Subjective Assessment - 03/16/20 1409    Subjective  [name] presents for OT Rx visit 8 of 36 to address BLE lymphedema. Pt presents 0/10 LE ralated pain today. Pt states she feels more confident about applying wraps since lat session.    Pertinent History PMHx extensive; Dx contributing to leg swelling include OA, B knee and R hip arthroplasties, asthma, HTN. Suspect LE venous insufficiency.    Limitations chronic pain, chronic leg swelling, generalized weakness, kyphotic posture    Repetition Increases Symptoms    Special Tests + stemmer sign bilaterally base of toes  Patient Stated Goals Reduce LE edema    Pain Onset --   30 yrs                               OT Education - 03/16/20 1413    Education Details Continued skilled Pt/caregiver education  And LE ADL training throughout visit for lymphedema self care/ home program, including compression wrapping, compression garment and device wear/care, lymphatic pumping ther ex, simple self-MLD, and skin care. Discussed progress towards goals.    Person(s) Educated Patient    Methods Explanation;Demonstration;Handout    Comprehension Verbalized understanding;Returned demonstration               OT Long Term Goals - 02/09/20 1623      OT LONG TERM GOAL #1    Title With modified independent (extra time) Pt will be able to apply knee length, multi-layer, short stretch compression wraps daily from ankle to tibial tuberosity on one leg at a time using correct gradient techniques to return affected limb/s, as closely as possible, to premorbid size and shape, to limit leg pain and infection risk, and to improve safe functional mobility and ADLs performance.    Baseline Dependent    Time 4    Period Days    Status New    Target Date --   5th OT visit (4th OT Rx visit)     OT LONG TERM GOAL #2   Title Pt will be able to verbalize signs and symptoms of cellulitis infection and identify lymphedema precautions using printed resource (modified independence) for reference to decrease infection risk and limit LE progression over time.    Baseline Max A    Time 4    Period Days    Status New    Target Date --   5th OT visit (4th OT Rx visit)     OT LONG TERM GOAL #3   Title Pt will sustain a least 85% compliance with all daily LE self-care home program components throughout Intensive Phase CDT, including impeccable skin care, lymphatic pumping ther ex,  compression wraps and simple self MLD, to ensure optimal limb volume reduction, to limit infection risk and to limit LE progression.    Baseline dependent    Time 12    Period Weeks    Status New    Target Date 05/09/20      OT LONG TERM GOAL #4   Title Using assistive devices (modified independence)  Pt will be able to don and doff appropriate daytime compression garments to limit edema re-accumulation and further progression by issue date.    Baseline Max A    Time 12    Period Weeks    Status New    Target Date 05/09/20      OT LONG TERM GOAL #5   Title After skilled teaching Pt  will be able to perform all lymphedema self-care home program components with modified independence (extra time, assistive devices PRN) , including simple self MLD, lymphatic pumping exercises, don, doff and wear  compression garments  daily, sustain   impeccable skin care daily, to limit lymphedema progression and infection risk.    Baseline Max A    Time 12    Period Weeks    Status New    Target Date 05/09/20      Long Term Additional Goals   Additional Long Term Goals Yes      OT LONG TERM  GOAL #6   Title Pt will increase score on FOTO 10 points, from 45 to 55/100, to improve functional performance of basic ADLs.    Baseline Max A    Time 12    Period Weeks    Status New    Target Date 05/09/20                 Plan - 03/16/20 1459    Clinical Impression Statement Pt tolerated MLD and compression wraps without increased pain today. Leg swelling is significantly decreased since commencing Rx, but swelling at dorsal foot remains stubborn with pitting edema. Added back "cigarette foam" supplementary pad. Cont as per POC.           Patient will benefit from skilled therapeutic intervention in order to improve the following deficits and impairments:           Visit Diagnosis: Lymphedema, not elsewhere classified    Problem List Patient Active Problem List   Diagnosis Date Noted  . Multiple thyroid nodules 12/28/2019  . Thyroid nodule 09/29/2019  . Lymphedema 11/10/2018  . PAD (peripheral artery disease) ([place]) 10/12/2018  . Aortic atherosclerosis ([place]) 10/12/2018  . Carotid stenosis, asymptomatic, bilateral 10/12/2018  . Positive colorectal cancer screening using Cologuard test 10/09/2018  . Gastroesophageal reflux disease   . Acute gastritis without hemorrhage   . Osteoarthritis 04/29/2018  . Hiatal hernia with GERD 04/29/2018  . Hyperlipidemia 04/29/2018  . Lymphedema of both lower extremities 04/29/2018  . Left knee pain 10/23/2016  . Cataracts, bilateral 07/31/2016  . Glaucoma 07/31/2016  . Lateral epicondylitis of left elbow 10/20/2015  . Vitamin D deficiency 10/20/2015  . Type 2 diabetes, controlled, with neuropathy ([place]) 10/16/2015  . Gouty arthritis  06/03/2015  . H/O syncope 06/03/2015  . Mild intermittent asthma 06/03/2015  . Multiple gastric ulcers 06/03/2015  . Schatzki's ring 06/03/2015  . Undifferentiated connective tissue disease ([place]) 06/03/2015  . Bone disorder 04/05/2015  . Dental injury 04/05/2015  . Pericarditis 04/05/2015  . Sjogren's syndrome ([place]) 09/07/2014  . Elevated alkaline phosphatase level 08/24/2014  . Lactose intolerance 08/24/2014  . Obesity 08/24/2014  . Peripheral edema 08/24/2014  . 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 Abdominal adhesions 08/02/2014  . 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[place]) 08/02/2014  . Degenerative joint disease (DJD) of lumbar spine 08/02/2014  . Diverticulosis of both small and large intestine 08/02/2014  . Hyperuricemia 08/02/2014  . Pure hypercholesterolemia 08/02/2014  . Trigger finger 08/02/2014  . Right shoulder pain 07/29/2014  . Benign neoplasm of colon 06/30/2010  . Right upper quadrant pain 06/30/2010    Andrey Spearman, MS, OTR/L, [facility] 03/16/20 3:04 PM   [place] MAIN [facility] SERVICES [address] [place], [place], [address] Phone: [phone]   Fax:  [phone]  Name: [name] MRN: [id number] Date of Birth: Apr 27, 1946

## 2020-03-17 ENCOUNTER — Encounter: Payer: Self-pay | Admitting: Physical Therapy

## 2020-03-17 ENCOUNTER — Ambulatory Visit: Payer: Medicare Other | Attending: Cardiovascular Disease | Admitting: Physical Therapy

## 2020-03-17 ENCOUNTER — Other Ambulatory Visit: Payer: Self-pay

## 2020-03-17 DIAGNOSIS — M545 Low back pain, unspecified: Secondary | ICD-10-CM | POA: Insufficient documentation

## 2020-03-17 DIAGNOSIS — I890 Lymphedema, not elsewhere classified: Secondary | ICD-10-CM | POA: Insufficient documentation

## 2020-03-17 DIAGNOSIS — M6281 Muscle weakness (generalized): Secondary | ICD-10-CM | POA: Diagnosis not present

## 2020-03-17 DIAGNOSIS — G8929 Other chronic pain: Secondary | ICD-10-CM | POA: Insufficient documentation

## 2020-03-17 NOTE — Therapy (Signed)
[place] [facility] [address] [place], [place], [address] Phone: [phone]   Fax:  [phone]  Physical Therapy Treatment  Patient Details  Name: [name] MRN: [id number] Date of Birth: 1946/04/16 Referring Provider (PT): Dr. Parks Ranger   Encounter Date: 03/17/2020   PT End of Session - 03/17/20 1446    Visit Number 18    Number of Visits 30    Date for PT Re-Evaluation 06/09/20    Authorization - Visit Number 1    Authorization - Number of Visits 10    PT Start Time 9417    PT Stop Time 1528    PT Time Calculation (min) 54 min    Activity Tolerance Patient tolerated treatment well;Patient limited by pain    Behavior During Therapy [facility] for tasks assessed/performed           Past Medical History:  Diagnosis Date  . Anterolisthesis    Cervical spine  . Asthma   . Connective tissue disorder (HCC)    recurrent carotid arteritis, temporal arteritis, vasculitis mandible, general hepatitis, avascular necrosis bilat  . DDD (degenerative disc disease), lumbar   . Diabetes mellitus without complication ([place])   . Diverticulosis   . Duodenitis   . Gouty arthritis   . Hiatal hernia with GERD   . History of cardiovascular stress test    a. 07/2018 [facility]): Fixed inferoapical defect w/ nl contraction-->attenuation artifact. No ischemia. EF 63%.  . Hyperlipidemia   . Hypertension    a. 02/2019 Renal artery duplex: no evidence of prox RAS.  Marland Kitchen Hyperuricemia   . Keratitis sicca, bilateral ([place])   . Lymphedema of both lower extremities   . Osteoarthritis   . Pericarditis    Recurrent: Aug 97, July 05, Sept 08, July 16, July 18  . PUD (peptic ulcer disease)   . 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[place])   . Sjogren's syndrome ([place])   . Syncope   . Tendonitis, Achilles, right   . Tortuous colon   . Trigger finger     Past Surgical History:  Procedure Laterality Date  . ABDOMINAL HYSTERECTOMY    . BREAST BIOPSY    . BREAST  EXCISIONAL BIOPSY Left 1986   neg  . CHOLECYSTECTOMY    . COLONOSCOPY WITH PROPOFOL N/A 08/26/2018   Procedure: COLONOSCOPY WITH PROPOFOL;  Surgeon: Lucilla Lame, MD;  Location: [facility] ENDOSCOPY;  Service: Endoscopy;  Laterality: N/A;  . CYSTOSCOPY  02/26/2018   [name], MD   . distal arthrectomy    . ESOPHAGOGASTRODUODENOSCOPY (EGD) WITH PROPOFOL N/A 08/26/2018   Procedure: ESOPHAGOGASTRODUODENOSCOPY (EGD) WITH PROPOFOL;  Surgeon: Lucilla Lame, MD;  Location: [facility] ENDOSCOPY;  Service: Endoscopy;  Laterality: N/A;  . EXCISION NEUROMA    . KNEE SURGERY Bilateral    1985, 2001, 11/2002, 12/2006  . panhysterectomy  10/1983  . SHOULDER SURGERY Right    reverse total arthroplasty w/ biceps tenodesis  . TONSILLECTOMY AND ADENOIDECTOMY    . TOTAL HIP ARTHROPLASTY Right 04/2007  . WISDOM TOOTH EXTRACTION      There were no vitals filed for this visit.   Subjective Assessment - 03/17/20 1435    Subjective Pt. reports lymphedema has been fluctuating since last week. Pt states she has not been very active due to covid.  Pt. states trigger point release STM from previous session improved her LBP.    Pertinent History Pt. from [place] and came to [place] to take care of friend and then Covid happened.  Pt. retired.  Pts. ex-husband lives in her house in [place].  R shoulder limitations (5# lifting restrictions).  Pt. went to [employer] [facility] [employer] for 10 years (every week) with benefits reported in low back.    Currently in Pain? Yes    Pain Score 3     Pain Location Back    Pain Orientation Right;Left;Lower    Pain Descriptors / Indicators Pressure;Heaviness;Tightness;Tiring;Discomfort    Pain Type Chronic pain           Reassessment of goals: improving B LE strength via MMT. Still have pain in R knee and L hip with resistance. Refer to long term goals section for specifics.     [facility] PT Assessment - 03/18/20 0001      Assessment   Medical Diagnosis Osteoarthritis multiple joints/ Chronic low back pain.    Referring  Provider (PT) Dr. Parks Ranger    Onset Date/Surgical Date 06/12/19      Prior Function   Level of Independence Independent          Manual Therapy:     STM along B lumbar and thoracic paraspinals for trigger point release for 20 min with pt in supine. Reduced reports of TTP from pt after STM along with reduction in trigger points with palpation.  Supine stretches: B glut, piriformis, and hamstring stretches. 3x30 sec.   PT Long Term Goals - 03/17/20 1440      PT LONG TERM GOAL #1   Title Pt. will be independent with HEP to increase B hip/LE muscle strengthening 1/2 muscle grade to improve pain-free mobility/ upright posture.    Baseline Generalized B LE muscle weakness grossly 4/5 MMT (pain limited L hip/ R knee).; 10/7: R/L Hip flexion 4+/4-, hip abduction 5/5, hip add 5/5, knee extension 4/5, knee flexion 5/5    Time 12    Period Weeks    Status Partially Met    Target Date 06/09/20      PT LONG TERM GOAL #2   Title Pt. will increase FOTO to 58 to improve pain-free mobility.    Baseline Initial FOTO: 53; 10/7: 38  (marked regression noted today)- pt. focused on pain/ swelling in lower legs    Time 12    Period Weeks    Status Not Met    Target Date 06/09/20      PT LONG TERM GOAL #3   Title Pt. will demonstrate/ maintain proper upright posture with standing tolerance with no increase c/o back pain to improve cooking.    Baseline Moderate rounded shoulders/ forward head posture.; 10/7: Maintaned round shoulder and forward head posture.    Time 12    Period Weeks    Status Partially Met    Target Date 06/09/20      PT LONG TERM GOAL #4   Title Pt. will report 4/10 low back pain at worst with no radicular symptoms to improve ADLs/ household chores.    Baseline Pt. reports no pain in back currently at rest but >6/10 pain with increase activity.; 10/7: Pain is > 5/10 NPS with household chores.    Time 12    Period Weeks    Status Not Met    Target Date 06/09/20                  Plan - 03/17/20 1456    Clinical Impression Statement Pt has not made significant progress toward goals except for BLE strength. Pt still reports > 5/10 pain via NPS with household chores with  ADL's. Pt responds well to [facility] and trigger point release in low back and glut/glut med with short term benefit. Applied supine LE stretches to improve LBP.    Personal Factors and Comorbidities Education    Stability/Clinical Decision Making Evolving/Moderate complexity    Clinical Decision Making Moderate    Rehab Potential Fair    PT Frequency 1x / week    PT Duration 12 weeks    PT Treatment/Interventions ADLs/Self Care Home Management;Electrical Stimulation;Moist Heat;Gait training;Stair training;Functional mobility training;Neuromuscular re-education;Balance training;Therapeutic exercise;Therapeutic activities;Patient/family education;Manual techniques;Passive range of motion    PT Next Visit Plan Recheck BP.  Focus on STM to lumbar/ B hips and pain mgmt.    Consulted and Agree with Plan of Care Patient           Patient will benefit from skilled therapeutic intervention in order to improve the following deficits and impairments:  Abnormal gait, Pain, Improper body mechanics, Postural dysfunction, Decreased mobility, Decreased activity tolerance, Decreased endurance, Decreased range of motion, Decreased strength, Impaired UE functional use, Impaired flexibility, Difficulty walking  Visit Diagnosis: Lymphedema, not elsewhere classified  Chronic bilateral low back pain without sciatica  Muscle weakness (generalized)     Problem List Patient Active Problem List   Diagnosis Date Noted  . Multiple thyroid nodules 12/28/2019  . Thyroid nodule 09/29/2019  . Lymphedema 11/10/2018  . PAD (peripheral artery disease) ([place]) 10/12/2018  . Aortic atherosclerosis ([place]) 10/12/2018  . Carotid stenosis, asymptomatic, bilateral 10/12/2018  . Positive colorectal cancer screening using  Cologuard test 10/09/2018  . Gastroesophageal reflux disease   . Acute gastritis without hemorrhage   . Osteoarthritis 04/29/2018  . Hiatal hernia with GERD 04/29/2018  . Hyperlipidemia 04/29/2018  . Lymphedema of both lower extremities 04/29/2018  . Left knee pain 10/23/2016  . Cataracts, bilateral 07/31/2016  . Glaucoma 07/31/2016  . Lateral epicondylitis of left elbow 10/20/2015  . Vitamin D deficiency 10/20/2015  . Type 2 diabetes, controlled, with neuropathy ([place]) 10/16/2015  . Gouty arthritis 06/03/2015  . H/O syncope 06/03/2015  . Mild intermittent asthma 06/03/2015  . Multiple gastric ulcers 06/03/2015  . Schatzki's ring 06/03/2015  . Undifferentiated connective tissue disease ([place]) 06/03/2015  . Bone disorder 04/05/2015  . Dental injury 04/05/2015  . Pericarditis 04/05/2015  . Sjogren's syndrome ([place]) 09/07/2014  . Elevated alkaline phosphatase level 08/24/2014  . Lactose intolerance 08/24/2014  . Obesity 08/24/2014  . Peripheral edema 08/24/2014  . 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 Abdominal adhesions 08/02/2014  . 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[place]) 08/02/2014  . Degenerative joint disease (DJD) of lumbar spine 08/02/2014  . Diverticulosis of both small and large intestine 08/02/2014  . Hyperuricemia 08/02/2014  . Pure hypercholesterolemia 08/02/2014  . Trigger finger 08/02/2014  . Right shoulder pain 07/29/2014  . Benign neoplasm of colon 06/30/2010  . Right upper quadrant pain 06/30/2010   Pura Spice, PT, DPT # [id number], SPT 03/18/2020, 9:07 AM  [place] [facility] [address] [place], [place], [address] Phone: [phone]   Fax:  [phone]  Name: [name] MRN: [id number] Date of Birth: 01-06-1946

## 2020-03-21 ENCOUNTER — Other Ambulatory Visit: Payer: Self-pay

## 2020-03-21 ENCOUNTER — Telehealth: Payer: Self-pay

## 2020-03-21 ENCOUNTER — Ambulatory Visit: Payer: Medicare Other | Admitting: Occupational Therapy

## 2020-03-21 DIAGNOSIS — I890 Lymphedema, not elsewhere classified: Secondary | ICD-10-CM | POA: Diagnosis not present

## 2020-03-21 NOTE — Telephone Encounter (Signed)
Spoke with referral coordinator, was resending today to [place] or [facility]

## 2020-03-21 NOTE — Telephone Encounter (Signed)
Copied from [place] [phone]. Topic: Quick Communication - See Telephone Encounter >> Mar 21, 2020 10:47 AM [place] wrote: CRM for notification. See Telephone encounter for: 03/21/20.I contacted Production manager and they informed me that they don't do testing for the reactions to COVID vaccine. She needs to be seen at [facility].   Wilson Singer, [facility]     03/10/20 4:49 PM Note Copied from [place] [phone]. Topic: General - Other >> Mar 10, 2020  2:00 PM Leward Quan A wrote: Reason for CRM: Patient called to inquire of a referral that was previously discussed for her to see an allergist. Per patient she was told if she did not hear anything by 03/09/20 to call back so she would like to know the status please. Per patient if there is no answer feel free to leave a detailed message. Thank you can be reached at  Ph#  UPDATE PT is upset as she states that she would  have expected an appt to be made for her at [facility] or [facility] wants a FU call and you may just leave a message

## 2020-03-21 NOTE — Therapy (Signed)
[place] MAIN [facility] SERVICES [address] [place], [place], [address] Phone: [phone]   Fax:  [phone]  Occupational Therapy Treatment Note and Progress Report: Lymphedema Care  Patient Details  Name: [name] MRN: [id number] Date of Birth: 04/27/1946 Referring Provider (OT): Ida Rogue, MD   Encounter Date: 03/21/2020   OT End of Session - 03/21/20 1501    Visit Number 9    Number of Visits 36    Date for OT Re-Evaluation 05/08/20    OT Start Time 0206    OT Stop Time 0301    OT Time Calculation (min) 55 min           Past Medical History:  Diagnosis Date  . Anterolisthesis    Cervical spine  . Asthma   . Connective tissue disorder (HCC)    recurrent carotid arteritis, temporal arteritis, vasculitis mandible, general hepatitis, avascular necrosis bilat  . DDD (degenerative disc disease), lumbar   . Diabetes mellitus without complication ([place])   . Diverticulosis   . Duodenitis   . Gouty arthritis   . Hiatal hernia with GERD   . History of cardiovascular stress test    a. 07/2018 [facility]): Fixed inferoapical defect w/ nl contraction-->attenuation artifact. No ischemia. EF 63%.  . Hyperlipidemia   . Hypertension    a. 02/2019 Renal artery duplex: no evidence of prox RAS.  Marland Kitchen Hyperuricemia   . Keratitis sicca, bilateral ([place])   . Lymphedema of both lower extremities   . Osteoarthritis   . Pericarditis    Recurrent: Aug 97, July 05, Sept 08, July 16, July 18  . PUD (peptic ulcer disease)   . 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[place])   . Sjogren's syndrome ([place])   . Syncope   . Tendonitis, Achilles, right   . Tortuous colon   . Trigger finger     Past Surgical History:  Procedure Laterality Date  . ABDOMINAL HYSTERECTOMY    . BREAST BIOPSY    . BREAST EXCISIONAL BIOPSY Left 1986   neg  . CHOLECYSTECTOMY    . COLONOSCOPY WITH PROPOFOL N/A 08/26/2018   Procedure: COLONOSCOPY WITH PROPOFOL;  Surgeon: Lucilla Lame, MD;  Location: [facility] ENDOSCOPY;  Service: Endoscopy;  Laterality: N/A;  . CYSTOSCOPY  02/26/2018   [name], MD   . distal arthrectomy    . ESOPHAGOGASTRODUODENOSCOPY (EGD) WITH PROPOFOL N/A 08/26/2018   Procedure: ESOPHAGOGASTRODUODENOSCOPY (EGD) WITH PROPOFOL;  Surgeon: Lucilla Lame, MD;  Location: [facility] ENDOSCOPY;  Service: Endoscopy;  Laterality: N/A;  . EXCISION NEUROMA    . KNEE SURGERY Bilateral    1985, 2001, 11/2002, 12/2006  . panhysterectomy  10/1983  . SHOULDER SURGERY Right    reverse total arthroplasty w/ biceps tenodesis  . TONSILLECTOMY AND ADENOIDECTOMY    . TOTAL HIP ARTHROPLASTY Right 04/2007  . WISDOM TOOTH EXTRACTION      There were no vitals filed for this visit.   Subjective Assessment - 03/21/20 1413    Subjective  [name] presents for OT Rx visit 9 of 36 to address BLE lymphedema. Pt presents 0/10 LE related pain today. Pt presents without compression wraps in place. Pt has not worn compression wraps since Saturday due to concern re a rash.    Pertinent History PMHx extensive; Dx contributing to leg swelling include OA, B knee and R hip arthroplasties, asthma, HTN. Suspect LE venous insufficiency.    Limitations chronic pain, chronic leg swelling, generalized weakness, kyphotic posture    Repetition  Increases Symptoms    Special Tests + stemmer sign bilaterally base of toes    Patient Stated Goals Reduce LE edema    Pain Onset --   30 yrs              LYMPHEDEMA/ONCOLOGY QUESTIONNAIRE - 03/21/20 0001      Right Lower Extremity Lymphedema   Other --      Left Lower Extremity Lymphedema   Other LLE A-D limb volume measures 3448.5 ml.    Other LLE A-D limb volume is minimally decreased by 3.4% since initial        volumetrics on 02/10/20.                   OT Treatments/Exercises (OP) - 03/21/20 0001      ADLs   ADL Education Given Yes      Manual Therapy   Manual Therapy Edema management    Manual therapy comments  comparative limb volumetrics    Manual Lymphatic Drainage (MLD) MLD to LLE as established    Compression Bandaging LLE short stretch knee length wraps as established                  OT Education - 03/21/20 1501    Education Details Continued skilled Pt/caregiver education  And LE ADL training throughout visit for lymphedema self care/ home program, including compression wrapping, compression garment and device wear/care, lymphatic pumping ther ex, simple self-MLD, and skin care. Discussed progress towards goals.    Person(s) Educated Patient    Methods Explanation;Demonstration;Handout    Comprehension Verbalized understanding;Returned demonstration               OT Long Term Goals - 03/21/20 1504      OT LONG TERM GOAL #1   Title With modified independent (extra time) Pt will be able to apply knee length, multi-layer, short stretch compression wraps daily from ankle to tibial tuberosity on one leg at a time using correct gradient techniques to return affected limb/s, as closely as possible, to premorbid size and shape, to limit leg pain and infection risk, and to improve safe functional mobility and ADLs performance.    Baseline Dependent    Time 4    Period Days    Status Achieved   achieved 4th visit     OT LONG TERM GOAL #2   Title Pt will be able to verbalize signs and symptoms of cellulitis infection and identify lymphedema precautions using printed resource (modified independence) for reference to decrease infection risk and limit LE progression over time.    Baseline Max A    Time 4    Period Days    Status Partially Met      OT LONG TERM GOAL #3   Title Pt will sustain a least 85% compliance with all daily LE self-care home program components throughout Intensive Phase CDT, including impeccable skin care, lymphatic pumping ther ex,  compression wraps and simple self MLD, to ensure optimal limb volume reduction, to limit infection risk and to limit LE progression.     Baseline dependent    Time 12    Period Weeks    Status Partially Met   OT questions goal range compliance with compression wrapping as leg tends to be more swollen after weekend visit intervals.     OT LONG TERM GOAL #4   Title Using assistive devices (modified independence)  Pt will be able to don and doff appropriate daytime compression garments to  limit edema re-accumulation and further progression by issue date.    Baseline Max A    Time 12    Period Weeks    Status On-going      OT LONG TERM GOAL #5   Title After skilled teaching Pt  will be able to perform all lymphedema self-care home program components with modified independence (extra time, assistive devices PRN) , including simple self MLD, lymphatic pumping exercises, don, doff and wear compression garments  daily, sustain   impeccable skin care daily, to limit lymphedema progression and infection risk.    Baseline Max A    Time 12    Period Weeks    Status Partially Met   Pt is trained for comrpession wrapping skin care and ther ex. She is still working on learning simple self MLD as of 03/21/20     OT LONG TERM GOAL #6   Title Pt will increase score on FOTO 10 points, from 45 to 55/100, to improve functional performance of basic ADLs.    Baseline Max A    Time 12    Period Weeks    Status On-going                 Plan - 03/21/20 1504    Clinical Impression Statement LLE swelling is noticably increased since last visit. Pt  has not worn compression wraps to LLE  as directed since Saturday., so compliance with LE self care remains an issue during visit intervals. Comparative LLE limb volumetrics reveal the LLE A-D limb volume is minimally decreased by 3.4% since initial volumetrics on 02/10/20.During periods of compliance limb swelling significantly decreased and last week it appeared as if Pt was ready to be measured for a compression garment. Unfortunately limb volume and tissue density is fluctuating greatly, so  we'll hold off on the garments in hopes of being ablke to stabilize edema. See Long Term Goals section for additional details on progress to date. Cont as per POC.    OT Occupational Profile and History Comprehensive Assessment- Review of records and extensive additional review of physical, cognitive, psychosocial history related to current functional performance           Patient will benefit from skilled therapeutic intervention in order to improve the following deficits and impairments:           Visit Diagnosis: Lymphedema, not elsewhere classified    Problem List Patient Active Problem List   Diagnosis Date Noted  . Multiple thyroid nodules 12/28/2019  . Thyroid nodule 09/29/2019  . Lymphedema 11/10/2018  . PAD (peripheral artery disease) ([place]) 10/12/2018  . Aortic atherosclerosis ([place]) 10/12/2018  . Carotid stenosis, asymptomatic, bilateral 10/12/2018  . Positive colorectal cancer screening using Cologuard test 10/09/2018  . Gastroesophageal reflux disease   . Acute gastritis without hemorrhage   . Osteoarthritis 04/29/2018  . Hiatal hernia with GERD 04/29/2018  . Hyperlipidemia 04/29/2018  . Lymphedema of both lower extremities 04/29/2018  . Left knee pain 10/23/2016  . Cataracts, bilateral 07/31/2016  . Glaucoma 07/31/2016  . Lateral epicondylitis of left elbow 10/20/2015  . Vitamin D deficiency 10/20/2015  . Type 2 diabetes, controlled, with neuropathy ([place]) 10/16/2015  . Gouty arthritis 06/03/2015  . H/O syncope 06/03/2015  . Mild intermittent asthma 06/03/2015  . Multiple gastric ulcers 06/03/2015  . Schatzki's ring 06/03/2015  . Undifferentiated connective tissue disease ([place]) 06/03/2015  . Bone disorder 04/05/2015  . Dental injury 04/05/2015  . Pericarditis 04/05/2015  . Sjogren's syndrome ([place]) 09/07/2014  .  Elevated alkaline phosphatase level 08/24/2014  . Lactose intolerance 08/24/2014  . Obesity 08/24/2014  . Peripheral edema 08/24/2014  . Benign  hypertension with CKD (chronic kidney disease) stage III 08/03/2014  . Abdominal adhesions 08/02/2014  . 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[place]) 08/02/2014  . Degenerative joint disease (DJD) of lumbar spine 08/02/2014  . Diverticulosis of both small and large intestine 08/02/2014  . Hyperuricemia 08/02/2014  . Pure hypercholesterolemia 08/02/2014  . Trigger finger 08/02/2014  . Right shoulder pain 07/29/2014  . Benign neoplasm of colon 06/30/2010  . Right upper quadrant pain 06/30/2010   Andrey Spearman, MS, OTR/L, [facility] 03/21/20 4:28 PM   [place] MAIN [facility] SERVICES [address] [place], [place], [address] Phone: [phone]   Fax:  [phone]  Name: Haydee Jabbour MRN: [id number] Date of Birth: 1946/03/14

## 2020-03-21 NOTE — Telephone Encounter (Signed)
I called and left a detail message on the patient vm, to notify her that [facility] declined the referral, because they do not see patient for COVID vaccine allergies. It took about 2-3 weeks before we heard back on that referral. They recommended that we refer her to [facility] or [facility]. The referral was place to [facility] and we are awaiting a response. I left the clinic phone number on the patient voicemail so she can f/u on the referral.

## 2020-03-23 ENCOUNTER — Ambulatory Visit: Payer: Medicare Other | Admitting: Occupational Therapy

## 2020-03-23 ENCOUNTER — Ambulatory Visit (INDEPENDENT_AMBULATORY_CARE_PROVIDER_SITE_OTHER): Payer: Medicare Other | Admitting: Pharmacist

## 2020-03-23 DIAGNOSIS — E1140 Type 2 diabetes mellitus with diabetic neuropathy, unspecified: Secondary | ICD-10-CM | POA: Diagnosis not present

## 2020-03-23 NOTE — Patient Instructions (Signed)
Thank you allowing the Chronic Care Management Team to be a part of your care! It was a pleasure speaking with you today!     CCM (Chronic Care Management) Team    Noreene Larsson RN, MSN, CCM Nurse Care Coordinator  [phone]   Harlow Asa PharmD  Clinical Pharmacist  [phone]   Eula Fried LCSW Clinical Social Worker [phone]  Visit Information  Goals Addressed              This Visit's Progress   .  PharmD - Medication Assistance (pt-stated)        Current Barriers:  . Financial Barriers: patient has [place] [employer] Preferred Part D insurance and reports copay for Tresiba, Humalog, Bystolic and Colcrys is cost prohibitive at this time o Patient currently enrolled in patient assistance for Tresiba, Humalog and Colcrys for 2021 calendar year  Pharmacist Clinical Goal(s):  Marland Kitchen Over the next 30 days, patient will work with PharmD and providers to relieve medication access concerns  Interventions: . Receive voicemail from patient requesting assistance with refill of Tresiba from [employer] patient assistance program . Follow up with patient regarding medication assistance o Reports has >2 month supply of Tresiba remaining.  o Advise to call clinic if she still has at least a 1 month supply remaining and has not received call from office that refill has arrived o Advise patient that she can also follow up with [facility] directly to determine date that [name] is eligible to be refilled through program . Patient reports canceled medical appointment today due to episode of vomited. States she wonders if it was something that she ate. Confirms will call office if symptoms worsen or continue. States will go to Urgent Care if she feels like she needs to be seen later today . Patient discusses concern for allergy to COVID-19 vaccination. Note patient has contacted the clinic about this adverse reaction to the vaccination. Latest referral place to [facility] by PCP on 10/11. Patient confirms she has contact number for clinic  Patient Self Care Activities:  . Patient to contact providers for new medical questions/concerns . Patient to attend scheduled medical appointments  Please see past updates related to this goal by clicking on the "Past Updates" button in the selected goal         Patient verbalizes understanding of instructions provided today.   Telephone follow up appointment with care management team member scheduled for: 11/1 at 11:15 am  Harlow Asa, PharmD, [place] [phone]

## 2020-03-23 NOTE — Chronic Care Management (AMB) (Signed)
Chronic Care Management   Follow Up Note   03/23/2020 Name: [name] MRN: [id number] DOB: September 26, 1945  Referred by: Verl Bangs, FNP Reason for referral : Chronic Care Management (Patient Phone Call)   [name] is a 74 y.o. year old female who is a primary care patient of [name], [name], [name]. The CCM team was consulted for assistance with chronic disease management and care coordination needs.    I reached out to [name] by phone today.   Review of patient status, including review of consultants reports, relevant laboratory and other test results, and collaboration with appropriate care team members and the patient's provider was performed as part of comprehensive patient evaluation and provision of chronic care management services.    SDOH (Social Determinants of Health) assessments performed: No See Care Plan activities for detailed interventions related to [facility])     Outpatient Encounter Medications as of 03/23/2020  Medication Sig  . cyclobenzaprine (FLEXERIL) 5 MG tablet TAKE 1 TABLET (5 MG TOTAL) BY MOUTH ONCE DAILY AS NEEDED FOR MUSCLE SPASMS  . insulin degludec (TRESIBA FLEXTOUCH) 200 UNIT/ML FlexTouch Pen Inject 56 Units into the skin daily.  Marland Kitchen acetaminophen (TYLENOL) 500 MG tablet Take 500 mg by mouth daily as needed.  . ARTIFICIAL TEAR OP Apply to eye as needed.   . BD INSULIN SYRINGE U/F 31G X 5/16" 1 ML MISC USE AS DIRECTED. WITH HUMALOG  . BIOTIN PO Take by mouth daily.  . Cholecalciferol (VITAMIN D3) 25 MCG (1000 UT) CAPS Take 4 capsules by mouth daily.   . clindamycin (CLEOCIN) 300 MG capsule Take 600 mg by mouth. 1 Hour before dental procedures  . cloNIDine (CATAPRES) 0.1 MG tablet Take 1 tablet (0.1 mg) by mouth three times daily  . COLCRYS 0.6 MG tablet Take 1 tablet (0.6 mg total) by mouth 2 (two) times daily as needed. For up to 3 days for pericarditis, or up to 7 days for gout or connective tissue disorder.  . ezetimibe  (ZETIA) 10 MG tablet Take 1 tablet (10 mg total) by mouth daily.  . famotidine (PEPCID) 20 MG tablet Take 20 mg by mouth 2 (two) times daily.  Marland Kitchen FREESTYLE LITE test strip USE AS DIRECTED THREE TIMES DAILY FOR DIABETES  . insulin lispro (HUMALOG) 100 UNIT/ML injection Correction scale: take 1 unit per 50 over 150 before meals up to three times daily.  Up to 20 units per day.  . Insulin Pen Needle (PEN NEEDLES) 32G X 5 MM MISC 1 Device by Does not apply route daily.  Marland Kitchen lactase (LACTAID) 3000 units tablet Take by mouth as needed.   . Lancets (FREESTYLE) lancets Must test x4/day  . losartan (COZAAR) 100 MG tablet Take 1 tablet (100 mg total) by mouth daily.  . nebivolol 20 MG TABS Take 1 tablet (20 mg total) by mouth daily.  . prednisoLONE acetate (PRED FORTE) 1 % ophthalmic suspension INSTILL ONE DROP  as needed  . rosuvastatin (CRESTOR) 10 MG tablet TAKE 1 TABLET BY MOUTH EVERY DAY   No facility-administered encounter medications on file as of 03/23/2020.    Goals Addressed              This Visit's Progress   .  PharmD - Medication Assistance (pt-stated)        Current Barriers:  . Financial Barriers: patient has [place] [employer] Preferred Part D insurance and reports copay for Tresiba, Humalog, Bystolic and Colcrys is cost prohibitive at this  time o Patient currently enrolled in patient assistance for Tresiba, Humalog and Colcrys for 2021 calendar year  Pharmacist Clinical Goal(s):  Marland Kitchen Over the next 30 days, patient will work with PharmD and providers to relieve medication access concerns  Interventions: . Receive voicemail from patient requesting assistance with refill of Tresiba from [employer] patient assistance program . Follow up with patient regarding medication assistance o Reports has >2 month supply of Tresiba remaining.  o Advise to call clinic if she still has at least a 1 month supply remaining and has not received call from office that refill has arrived o Advise  patient that she can also follow up with [facility] directly to determine date that [name] is eligible to be refilled through program . Patient reports canceled medical appointment today due to episode of vomited. States she wonders if it was something that she ate. Confirms will call office if symptoms worsen or continue. States will go to Urgent Care if she feels like she needs to be seen later today . Patient discusses concern for allergy to COVID-19 vaccination. Note patient has contacted the clinic about this adverse reaction to the vaccination. Latest referral place to [facility] by PCP on 10/11. Patient confirms she has contact number for clinic  Patient Self Care Activities:  . Patient to contact providers for new medical questions/concerns . Patient to attend scheduled medical appointments  Please see past updates related to this goal by clicking on the "Past Updates" button in the selected goal         Plan  Telephone follow up appointment with care management team member scheduled for: 11/1 at 11:15 am  Harlow Asa, PharmD, [place] [phone]

## 2020-03-24 ENCOUNTER — Ambulatory Visit: Payer: Medicare Other

## 2020-03-24 ENCOUNTER — Encounter: Payer: Self-pay | Admitting: Physical Therapy

## 2020-03-24 ENCOUNTER — Other Ambulatory Visit: Payer: Self-pay

## 2020-03-24 DIAGNOSIS — I890 Lymphedema, not elsewhere classified: Secondary | ICD-10-CM | POA: Diagnosis not present

## 2020-03-24 DIAGNOSIS — M545 Low back pain, unspecified: Secondary | ICD-10-CM | POA: Diagnosis not present

## 2020-03-24 DIAGNOSIS — G8929 Other chronic pain: Secondary | ICD-10-CM

## 2020-03-24 DIAGNOSIS — M6281 Muscle weakness (generalized): Secondary | ICD-10-CM

## 2020-03-24 NOTE — Therapy (Addendum)
[place] [facility] [address] [place], [place], [address] Phone: [phone]   Fax:  [phone]  Physical Therapy Treatment   Patient Details  Name: [name] MRN: [id number] Date of Birth: 1946-06-01 Referring Provider (PT): Dr. Parks Ranger   Encounter Date: 03/24/2020   PT End of Session - 03/24/20 1441    Visit Number 19    Number of Visits 30    Date for PT Re-Evaluation 06/09/20    Authorization - Visit Number 2    Authorization - Number of Visits 10    PT Start Time 1435    PT Stop Time 1516    PT Time Calculation (min) 41 min    Activity Tolerance Patient tolerated treatment well;Patient limited by pain    Behavior During Therapy [facility] for tasks assessed/performed           Past Medical History:  Diagnosis Date  . Anterolisthesis    Cervical spine  . Asthma   . Connective tissue disorder (HCC)    recurrent carotid arteritis, temporal arteritis, vasculitis mandible, general hepatitis, avascular necrosis bilat  . DDD (degenerative disc disease), lumbar   . Diabetes mellitus without complication ([place])   . Diverticulosis   . Duodenitis   . Gouty arthritis   . Hiatal hernia with GERD   . History of cardiovascular stress test    a. 07/2018 MV [facility]): Fixed inferoapical defect w/ nl contraction-->attenuation artifact. No ischemia. EF 63%.  . Hyperlipidemia   . Hypertension    a. 02/2019 Renal artery duplex: no evidence of prox RAS.  Marland Kitchen Hyperuricemia   . Keratitis sicca, bilateral ([place])   . Lymphedema of both lower extremities   . Osteoarthritis   . Pericarditis    Recurrent: Aug 97, July 05, Sept 08, July 16, July 18  . PUD (peptic ulcer disease)   . 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[place])   . Sjogren's syndrome ([place])   . Syncope   . Tendonitis, Achilles, right   . Tortuous colon   . Trigger finger     Past Surgical History:  Procedure Laterality Date  . ABDOMINAL HYSTERECTOMY    . BREAST BIOPSY    . BREAST  EXCISIONAL BIOPSY Left 1986   neg  . CHOLECYSTECTOMY    . COLONOSCOPY WITH PROPOFOL N/A 08/26/2018   Procedure: COLONOSCOPY WITH PROPOFOL;  Surgeon: Lucilla Lame, MD;  Location: [facility] ENDOSCOPY;  Service: Endoscopy;  Laterality: N/A;  . CYSTOSCOPY  02/26/2018   [name], MD   . distal arthrectomy    . ESOPHAGOGASTRODUODENOSCOPY (EGD) WITH PROPOFOL N/A 08/26/2018   Procedure: ESOPHAGOGASTRODUODENOSCOPY (EGD) WITH PROPOFOL;  Surgeon: Lucilla Lame, MD;  Location: [facility] ENDOSCOPY;  Service: Endoscopy;  Laterality: N/A;  . EXCISION NEUROMA    . KNEE SURGERY Bilateral    1985, 2001, 11/2002, 12/2006  . panhysterectomy  10/1983  . SHOULDER SURGERY Right    reverse total arthroplasty w/ biceps tenodesis  . TONSILLECTOMY AND ADENOIDECTOMY    . TOTAL HIP ARTHROPLASTY Right 04/2007  . WISDOM TOOTH EXTRACTION      There were no vitals filed for this visit.   Subjective Assessment - 03/24/20 1440    Subjective Pt. reports that her pain is a bit better with the soft tissue work.    Currently in Pain? Yes    Pain Score 2     Pain Location Back    Pain Orientation Right;Left;Lower    Pain Descriptors / Indicators Heaviness;Pressure;Tightness    Pain Type Chronic  pain    Pain Onset More than a month ago             Manual Therapy:                           STM along B lumbar and thoracic paraspinals for trigger point release for 40 min with pt in supine. Reduced reports of TTP from pt after STM along with reduction in trigger points with palpation.      PT Long Term Goals - 03/17/20 1440      PT LONG TERM GOAL #1   Title Pt. will be independent with HEP to increase B hip/LE muscle strengthening 1/2 muscle grade to improve pain-free mobility/ upright posture.    Baseline Generalized B LE muscle weakness grossly 4/5 MMT (pain limited L hip/ R knee).; 10/7: R/L Hip flexion 4+/4-, hip abduction 5/5, hip add 5/5, knee extension 4/5, knee flexion 5/5    Time 12    Period Weeks    Status  Partially Met    Target Date 06/09/20      PT LONG TERM GOAL #2   Title Pt. will increase FOTO to 58 to improve pain-free mobility.    Baseline Initial FOTO: 53; 10/7: 38  (marked regression noted today)- pt. focused on pain/ swelling in lower legs    Time 12    Period Weeks    Status Not Met    Target Date 06/09/20      PT LONG TERM GOAL #3   Title Pt. will demonstrate/ maintain proper upright posture with standing tolerance with no increase c/o back pain to improve cooking.    Baseline Moderate rounded shoulders/ forward head posture.; 10/7: Maintaned round shoulder and forward head posture.    Time 12    Period Weeks    Status Partially Met    Target Date 06/09/20      PT LONG TERM GOAL #4   Title Pt. will report 4/10 low back pain at worst with no radicular symptoms to improve ADLs/ household chores.    Baseline Pt. reports no pain in back currently at rest but >6/10 pain with increase activity.; 10/7: Pain is > 5/10 NPS with household chores.    Time 12    Period Weeks    Status Not Met    Target Date 06/09/20                 Plan - 03/24/20 1437    Clinical Impression Statement Progress note, pt reassessed goals last week. Pt. continues to respond well to manual therapy with reports of decreased pain and improved ability to stair climb with decreased back pain. Pt. will continue to benefit from skilled PT to decrease pain and improve functional mobility.    Personal Factors and Comorbidities Education    Stability/Clinical Decision Making Evolving/Moderate complexity    Clinical Decision Making Moderate    Rehab Potential Fair    PT Frequency 1x / week    PT Duration 12 weeks    PT Treatment/Interventions ADLs/Self Care Home Management;Electrical Stimulation;Moist Heat;Gait training;Stair training;Functional mobility training;Neuromuscular re-education;Balance training;Therapeutic exercise;Therapeutic activities;Patient/family education;Manual techniques;Passive  range of motion    PT Next Visit Plan Recheck BP.  Focus on STM to lumbar/ B hips and pain mgmt.    Consulted and Agree with Plan of Care Patient           Patient will benefit from skilled therapeutic intervention in order to  improve the following deficits and impairments:  Abnormal gait, Pain, Improper body mechanics, Postural dysfunction, Decreased mobility, Decreased activity tolerance, Decreased endurance, Decreased range of motion, Decreased strength, Impaired UE functional use, Impaired flexibility, Difficulty walking  Visit Diagnosis:  Chronic bilateral low back pain without sciatica  Muscle weakness (generalized)     Problem List Patient Active Problem List   Diagnosis Date Noted  . Multiple thyroid nodules 12/28/2019  . Thyroid nodule 09/29/2019  . Lymphedema 11/10/2018  . PAD (peripheral artery disease) ([place]) 10/12/2018  . Aortic atherosclerosis ([place]) 10/12/2018  . Carotid stenosis, asymptomatic, bilateral 10/12/2018  . Positive colorectal cancer screening using Cologuard test 10/09/2018  . Gastroesophageal reflux disease   . Acute gastritis without hemorrhage   . Osteoarthritis 04/29/2018  . Hiatal hernia with GERD 04/29/2018  . Hyperlipidemia 04/29/2018  . Lymphedema of both lower extremities 04/29/2018  . Left knee pain 10/23/2016  . Cataracts, bilateral 07/31/2016  . Glaucoma 07/31/2016  . Lateral epicondylitis of left elbow 10/20/2015  . Vitamin D deficiency 10/20/2015  . Type 2 diabetes, controlled, with neuropathy ([place]) 10/16/2015  . Gouty arthritis 06/03/2015  . H/O syncope 06/03/2015  . Mild intermittent asthma 06/03/2015  . Multiple gastric ulcers 06/03/2015  . Schatzki's ring 06/03/2015  . Undifferentiated connective tissue disease ([place]) 06/03/2015  . Bone disorder 04/05/2015  . Dental injury 04/05/2015  . Pericarditis 04/05/2015  . Sjogren's syndrome ([place]) 09/07/2014  . Elevated alkaline phosphatase level 08/24/2014  . Lactose intolerance  08/24/2014  . Obesity 08/24/2014  . Peripheral edema 08/24/2014  . 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 Abdominal adhesions 08/02/2014  . 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[place]) 08/02/2014  . Degenerative joint disease (DJD) of lumbar spine 08/02/2014  . Diverticulosis of both small and large intestine 08/02/2014  . Hyperuricemia 08/02/2014  . Pure hypercholesterolemia 08/02/2014  . Trigger finger 08/02/2014  . Right shoulder pain 07/29/2014  . Benign neoplasm of colon 06/30/2010  . Right upper quadrant pain 06/30/2010    Carlyle Basques, SPT 03/25/2020, 10:48 AM  [place] [facility] [address]. [place], [place], [address] Phone: [phone]   Fax:  [phone]  Name: [name] MRN: [id number] Date of Birth: [birth year]

## 2020-03-28 ENCOUNTER — Ambulatory Visit: Payer: Medicare Other | Admitting: Occupational Therapy

## 2020-03-28 ENCOUNTER — Other Ambulatory Visit: Payer: Self-pay

## 2020-03-28 DIAGNOSIS — I890 Lymphedema, not elsewhere classified: Secondary | ICD-10-CM | POA: Diagnosis not present

## 2020-03-28 DIAGNOSIS — M1711 Unilateral primary osteoarthritis, right knee: Secondary | ICD-10-CM | POA: Diagnosis not present

## 2020-03-29 ENCOUNTER — Encounter: Payer: Self-pay | Admitting: Family Medicine

## 2020-03-29 ENCOUNTER — Ambulatory Visit (INDEPENDENT_AMBULATORY_CARE_PROVIDER_SITE_OTHER): Payer: Medicare Other | Admitting: Family Medicine

## 2020-03-29 ENCOUNTER — Other Ambulatory Visit: Payer: Self-pay

## 2020-03-29 VITALS — BP 149/81 | HR 62 | Temp 98.7°F | Resp 18 | Ht 69.0 in

## 2020-03-29 DIAGNOSIS — R110 Nausea: Secondary | ICD-10-CM | POA: Diagnosis not present

## 2020-03-29 DIAGNOSIS — E1140 Type 2 diabetes mellitus with diabetic neuropathy, unspecified: Secondary | ICD-10-CM

## 2020-03-29 DIAGNOSIS — M25562 Pain in left knee: Secondary | ICD-10-CM

## 2020-03-29 MED ORDER — ONDANSETRON 4 MG PO TBDP: 4.0000 mg | ORAL_TABLET | Freq: Three times a day (TID) | ORAL | 0 refills | Status: AC | PRN

## 2020-03-29 NOTE — Therapy (Addendum)
[place] MAIN [facility] SERVICES [address] [place], [place], [address] Phone: [phone]   Fax:  [phone]  Occupational Therapy Treatment  Patient Details  Name: [name] MRN: [id number] Date of Birth: 1945-10-10 Referring Provider (OT): Ida Rogue, MD   Encounter Date: 03/28/2020   OT End of Session - 03/29/20 1604    Visit Number 10   Number of Visits 36    Date for OT Re-Evaluation 05/08/20    OT Start Time 0213    OT Stop Time 0315    OT Time Calculation (min) 62 min    Activity Tolerance Patient tolerated treatment well;No increased pain           Past Medical History:  Diagnosis Date  . Anterolisthesis    Cervical spine  . Asthma   . Connective tissue disorder (HCC)    recurrent carotid arteritis, temporal arteritis, vasculitis mandible, general hepatitis, avascular necrosis bilat  . DDD (degenerative disc disease), lumbar   . Diabetes mellitus without complication ([place])   . Diverticulosis   . Duodenitis   . Gouty arthritis   . Hiatal hernia with GERD   . History of cardiovascular stress test    a. 07/2018 MV [facility]): Fixed inferoapical defect w/ nl contraction-->attenuation artifact. No ischemia. EF 63%.  . Hyperlipidemia   . Hypertension    a. 02/2019 Renal artery duplex: no evidence of prox RAS.  Marland Kitchen Hyperuricemia   . Keratitis sicca, bilateral ([place])   . Lymphedema of both lower extremities   . Osteoarthritis   . Pericarditis    Recurrent: Aug 97, July 05, Sept 08, July 16, July 18  . PUD (peptic ulcer disease)   . 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[place])   . Sjogren's syndrome ([place])   . Syncope   . Tendonitis, Achilles, right   . Tortuous colon   . Trigger finger     Past Surgical History:  Procedure Laterality Date  . ABDOMINAL HYSTERECTOMY    . BREAST BIOPSY    . BREAST EXCISIONAL BIOPSY Left 1986   neg  . CHOLECYSTECTOMY    . COLONOSCOPY WITH PROPOFOL N/A 08/26/2018   Procedure: COLONOSCOPY WITH  PROPOFOL;  Surgeon: Lucilla Lame, MD;  Location: [facility] ENDOSCOPY;  Service: Endoscopy;  Laterality: N/A;  . CYSTOSCOPY  02/26/2018   [name], MD   . distal arthrectomy    . ESOPHAGOGASTRODUODENOSCOPY (EGD) WITH PROPOFOL N/A 08/26/2018   Procedure: ESOPHAGOGASTRODUODENOSCOPY (EGD) WITH PROPOFOL;  Surgeon: Lucilla Lame, MD;  Location: [facility] ENDOSCOPY;  Service: Endoscopy;  Laterality: N/A;  . EXCISION NEUROMA    . KNEE SURGERY Bilateral    1985, 2001, 11/2002, 12/2006  . panhysterectomy  10/1983  . SHOULDER SURGERY Right    reverse total arthroplasty w/ biceps tenodesis  . TONSILLECTOMY AND ADENOIDECTOMY    . TOTAL HIP ARTHROPLASTY Right 04/2007  . WISDOM TOOTH EXTRACTION      There were no vitals filed for this visit.   Subjective Assessment - 03/28/20 1419    Subjective  [name] presents for OT Rx visit  10 of 36 to address BLE lymphedema. Pt presents 0/10 LE related pain today. Pt presents with compression wraps in place. Pt reports she "ripped her knee" on friday and knee pain today is report at 10/10.    Pertinent History PMHx extensive; Dx contributing to leg swelling include OA, B knee and R hip arthroplasties, asthma, HTN. Suspect LE venous insufficiency.    Limitations chronic pain, chronic leg swelling, generalized  weakness, kyphotic posture    Repetition Increases Symptoms    Special Tests + stemmer sign bilaterally base of toes    Patient Stated Goals Reduce LE edema    Pain Onset --   30 yrs                                    OT Long Term Goals - 03/28/20 1421      OT LONG TERM GOAL #1   Title With modified independent (extra time) Pt will be able to apply knee length, multi-layer, short stretch compression wraps daily from ankle to tibial tuberosity on one leg at a time using correct gradient techniques to return affected limb/s, as closely as possible, to premorbid size and shape, to limit leg pain and infection risk, and to improve safe  functional mobility and ADLs performance.    Baseline Dependent    Time 4    Period Days    Status Achieved   achieved 4th visit     OT LONG TERM GOAL #2   Title Pt will be able to verbalize signs and symptoms of cellulitis infection and identify lymphedema precautions using printed resource (modified independence) for reference to decrease infection risk and limit LE progression over time.    Baseline Max A    Time 4    Period Days    Status Achieved      OT LONG TERM GOAL #3   Title Pt will sustain a least 85% compliance with all daily LE self-care home program components throughout Intensive Phase CDT, including impeccable skin care, lymphatic pumping ther ex,  compression wraps and simple self MLD, to ensure optimal limb volume reduction, to limit infection risk and to limit LE progression.    Baseline dependent    Time 12    Period Weeks    Status Achieved   "I'm 98% !"     OT LONG TERM GOAL #4   Title Using assistive devices (modified independence)  Pt will be able to don and doff appropriate daytime compression garments to limit edema re-accumulation and further progression by issue date.    Baseline Max A    Time 12    Period Weeks    Status On-going      OT LONG TERM GOAL #5   Title After skilled teaching Pt  will be able to perform all lymphedema self-care home program components with modified independence (extra time, assistive devices PRN) , including simple self MLD, lymphatic pumping exercises, don, doff and wear compression garments  daily, sustain   impeccable skin care daily, to limit lymphedema progression and infection risk.    Baseline Max A    Time 12    Period Weeks    Status On-going   Pt is trained for comrpession wrapping skin care and ther ex. She is still working on learning simple self MLD as of 03/21/20     OT LONG TERM GOAL #6   Title Pt will increase score on FOTO 10 points, from 45 to 55/100, to improve functional performance of basic ADLs.     Baseline Max A    Time 12    Period Weeks    Status On-going                 Plan - 03/28/20 1434    Clinical Impression Statement See Note for 03/21/20 for progress towards goalss. Pt's Physical  FOTO score at visit 10 Measures 54/100. Her initial intake score was 58. Funtional performance according to the FOTO instrument is decreased by 4 points due to recent knee injury, not due poor progress with lymphedema management. Pt remains compliant with compression today on LLE. Limb volume and tissue density is decreased slightly since commencing OT for CDT. Cont as per POC.    OT Occupational Profile and History Comprehensive Assessment- Review of records and extensive additional review of physical, cognitive, psychosocial history related to current functional performance           OT Occupational Profile and History Comprehensive Assessment- Review of records and extensive additional review of physical, cognitive, psychosocial history related to current functional performance    Occupational performance deficits (Please refer to evaluation for details): ADL's;IADL's;Social Participation;Work;Leisure;Other   body image   Body Structure / Function / Physical Skills ADL;Decreased knowledge of precautions;ROM;Balance;Decreased knowledge of use of DME;Mobility;Edema;Skin integrity;Pain;IADL    Rehab Potential Good    Clinical Decision Making Several treatment options, min-mod task modification necessary    Comorbidities Affecting Occupational Performance: Presence of comorbidities impacting occupational performance    Modification or Assistance to Complete Evaluation  Min-Moderate modification of tasks or assist with assess necessary to complete eval    OT Frequency 2x / week    OT Duration 12 weeks   and PRN   OT Treatment/Interventions Self-care/ADL training;Therapeutic exercise;Manual lymph drainage;Compression bandaging;Patient/family education;Other (comment);Therapeutic  activities;DME and/or AE instruction;Manual Therapy    Plan Intensive Phase CDT: 1 leg at a time to limit fall risk. MLD, skin care, ther ex , cimpression wraps -> daytime garments, HOS device, consider Flexitouch if appropriate    Recommended Other Services Fit w/ appropriate compression garments that are effective, comfortable and  Pt can don using AE and extra time    Consulted and Agree with Plan of Care Patient              Patient will benefit from skilled therapeutic intervention in order to improve the following deficits and impairments:           Visit Diagnosis: Lymphedema, not elsewhere classified    Problem List Patient Active Problem List   Diagnosis Date Noted  . Multiple thyroid nodules 12/28/2019  . Thyroid nodule 09/29/2019  . Lymphedema 11/10/2018  . PAD (peripheral artery disease) ([place]) 10/12/2018  . Aortic atherosclerosis ([place]) 10/12/2018  . Carotid stenosis, asymptomatic, bilateral 10/12/2018  . Positive colorectal cancer screening using Cologuard test 10/09/2018  . Gastroesophageal reflux disease   . Acute gastritis without hemorrhage   . Osteoarthritis 04/29/2018  . Hiatal hernia with GERD 04/29/2018  . Hyperlipidemia 04/29/2018  . Lymphedema of both lower extremities 04/29/2018  . Left knee pain 10/23/2016  . Cataracts, bilateral 07/31/2016  . Glaucoma 07/31/2016  . Lateral epicondylitis of left elbow 10/20/2015  . Vitamin D deficiency 10/20/2015  . Type 2 diabetes, controlled, with neuropathy ([place]) 10/16/2015  . Gouty arthritis 06/03/2015  . H/O syncope 06/03/2015  . Mild intermittent asthma 06/03/2015  . Multiple gastric ulcers 06/03/2015  . Schatzki's ring 06/03/2015  . Undifferentiated connective tissue disease ([place]) 06/03/2015  . Bone disorder 04/05/2015  . Dental injury 04/05/2015  . Pericarditis 04/05/2015  . Sjogren's syndrome ([place]) 09/07/2014  . Elevated alkaline phosphatase level 08/24/2014  . Lactose intolerance  08/24/2014  . Obesity 08/24/2014  . Peripheral edema 08/24/2014  . 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 Abdominal adhesions 08/02/2014  . Avascular necrosis of bone  of left hip ([place]) 08/02/2014  . Degenerative joint disease (DJD) of lumbar spine 08/02/2014  . Diverticulosis of both small and large intestine 08/02/2014  . Hyperuricemia 08/02/2014  . Pure hypercholesterolemia 08/02/2014  . Trigger finger 08/02/2014  . Right shoulder pain 07/29/2014  . Benign neoplasm of colon 06/30/2010  . Right upper quadrant pain 06/30/2010      Andrey Spearman, MS, OTR/L, [facility] 03/29/20 4:16 PM  [place] MAIN [facility] SERVICES [address] [place], [place], [address] Phone: [phone]   Fax:  [phone]  Name: Tyanne Derocher MRN: [id number] Date of Birth: 1945-09-19

## 2020-03-29 NOTE — Progress Notes (Signed)
Subjective:    Patient ID: [name], female    DOB: 06/06/1946, 74 y.o.   MRN: [id number]  [name] is a 74 y.o. female presenting on 03/29/2020 for Diabetes (pt currently have an appt with the Allergist on 05/03/21) and Knee Injury (pt injured the Lt knee x 4 days ago. She was seen at [facility] on yesterday and told that she might need a total knee replacement )   HPI  Diabetes Pt presents today for follow up Type 2 Diabetes Mellitus.  He/she (caps): She ACTION; IS/IS NOT: is checking AM CBG at home. -Current diabetic medications include: Tresiba 56 units daily and humalog sliding scale -ACTION; IS/IS NOT: is not currently symptomatic -Actions; denies/reports/admits to: denies polydipsia, polyphagia, polyuria, headaches, diaphoresis, shakiness, chills, pain, numbness or tingling in extremities or changes in vision -Clinical course has been stable -Reports no exercise routine -Diet is moderate in salt, moderate in fat, and moderat in carbohydrates  PREVENTION Eye exam current (within 1 year) Up to date Foot exam current (within 1 year) Up to date Lipid/ASCVD risk reduction - on statin: YES/NO: Yes  Kidney Protection (On ACE/ARB)? YES/NO: Yes    Depression screen [facility] 2/9 08/31/2019 05/18/2019 02/12/2019  Decreased Interest 0 0 0  Down, Depressed, Hopeless 0 0 0  PHQ - 2 Score 0 0 0    Social History   Tobacco Use  . Smoking status: Former Smoker    Quit date: 01/22/1996    Years since quitting: 24.2  . Smokeless tobacco: Never Used  Vaping Use  . Vaping Use: Never used  Substance Use Topics  . Alcohol use: Yes    Comment: occasional-once every 6 months  . Drug use: Never    Review of Systems  Constitutional: Negative.   HENT: Negative.   Eyes: Negative.   Respiratory: Negative.   Cardiovascular: Negative.   Gastrointestinal: Positive for nausea. Negative for abdominal distention, abdominal pain, anal bleeding, blood in stool, constipation,  diarrhea, rectal pain and vomiting.  Endocrine: Negative.   Genitourinary: Negative.   Musculoskeletal: Positive for arthralgias. Negative for back pain, gait problem, joint swelling, myalgias, neck pain and neck stiffness.  Skin: Negative.   Allergic/Immunologic: Negative.   Neurological: Negative.   Hematological: Negative.   Psychiatric/Behavioral: Negative.    Per HPI unless specifically indicated above     Objective:    BP (!) 149/81 (BP Location: Right Arm, Patient Position: Sitting, Cuff Size: Normal)   Pulse 62   Temp 98.7 F (37.1 C) (Oral)   Resp 18   Ht _0  (1.753 m)   BMI 35.15 kg/m   Wt Readings from Last 3 Encounters:  12/28/19 238 lb (108 kg)  12/22/19 240 lb 2 oz (108.9 kg)  12/07/19 241 lb 6 oz (109.5 kg)    Physical Exam Vitals and nursing note reviewed.  Constitutional:      General: She is not in acute distress.    Appearance: Normal appearance. She is well-developed and well-groomed. She is not ill-appearing or toxic-appearing.  HENT:     Head: Normocephalic and atraumatic.     Nose:     Comments: Lizbeth Bark is in place, covering mouth and nose. Eyes:     General: Lids are normal. Vision grossly intact.        Right eye: No discharge.        Left eye: No discharge.     Extraocular Movements: Extraocular movements intact.     Conjunctiva/sclera: Conjunctivae normal.  Pupils: Pupils are equal, round, and reactive to light.  Cardiovascular:     Pulses: Normal pulses.  Pulmonary:     Effort: Pulmonary effort is normal. No respiratory distress.  Skin:    General: Skin is warm and dry.     Capillary Refill: Capillary refill takes less than 2 seconds.  Neurological:     General: No focal deficit present.     Mental Status: She is alert and oriented to person, place, and time.  Psychiatric:        Attention and Perception: Attention and perception normal.        Mood and Affect: Mood and affect normal.        Speech: Speech normal.         Behavior: Behavior normal. Behavior is cooperative.        Thought Content: Thought content normal.        Cognition and Memory: Cognition and memory normal.        Judgment: Judgment normal.    Results for orders placed or performed during the hospital encounter of 12/07/19  TSH  Result Value Ref Range   TSH 0.901 0.350 - 4.500 uIU/mL  Comprehensive metabolic panel  Result Value Ref Range   Sodium 137 135 - 145 mmol/L   Potassium 4.6 3.5 - 5.1 mmol/L   Chloride 99 98 - 111 mmol/L   CO2 27 22 - 32 mmol/L   Glucose, Bld 104 (H) 70 - 99 mg/dL   BUN 20 8 - 23 mg/dL   Creatinine, Ser 1.05 (H) 0.44 - 1.00 mg/dL   Calcium 9.1 8.9 - 10.3 mg/dL   Total Protein 7.3 6.5 - 8.1 g/dL   Albumin 3.7 3.5 - 5.0 g/dL   AST 22 15 - 41 U/L   ALT 20 0 - 44 U/L   Alkaline Phosphatase 83 38 - 126 U/L   Total Bilirubin 0.7 0.3 - 1.2 mg/dL   GFR calc non Af Amer 53 (L) >60 mL/min   GFR calc Af Amer >60 >60 mL/min   Anion gap 11 5 - 15  CBC  Result Value Ref Range   WBC 12.3 (H) 4.0 - 10.5 K/uL   RBC 5.32 (H) 3.87 - 5.11 MIL/uL   Hemoglobin 14.9 12.0 - 15.0 g/dL   HCT 42.8 36 - 46 %   MCV 80.5 80.0 - 100.0 fL   MCH 28.0 26.0 - 34.0 pg   MCHC 34.8 30.0 - 36.0 g/dL   RDW 13.0 11.5 - 15.5 %   Platelets 288 150 - 400 K/uL   nRBC 0.0 0.0 - 0.2 %  LDL cholesterol, direct  Result Value Ref Range   Direct LDL 77.1 0 - 99 mg/dL  Lipid panel  Result Value Ref Range   Cholesterol 155 0 - 200 mg/dL   Triglycerides 248 (H) <150 mg/dL   HDL 41 >40 mg/dL   Total CHOL/HDL Ratio 3.8 RATIO   VLDL 50 (H) 0 - 40 mg/dL   LDL Cholesterol 64 0 - 99 mg/dL      Assessment & Plan:   Problem List Items Addressed This Visit      Endocrine   Type 2 diabetes, controlled, with neuropathy ([place]) - Primary    UncontrolledDM with A1c 8.5% Worsening control from 8.2% on 12/01/2019 and goal A1c < 7.0%. - Complications - peripheral neuropathy and hyperglycemia.  Plan:  1. Continue current therapy: tresiba 56 units  daily and humalog sliding scale 2. Encourage improved lifestyle: - low  carb/low glycemic diet reinforced prior education - Increase physical activity to 30 minutes most days of the week.  Explained that increased physical activity increases body's use of sugar for energy. 3. Check fasting am CBG and log these.  Bring log to next visit for review 4. Continue ARB and Statin 5. Up to date on DM eye and foot exam 6. Follow-up 3 months       Relevant Orders   POCT glycosylated hemoglobin (Hb A1C)     Other   Left knee pain    Has met with [facility] on 03/28/2020 for evaluation and treatment, reports is awaiting approval for MRI.      Nausea    Intermittent nausea, will treat with ondansetron ODT 82m PRN.      Relevant Medications   ondansetron (ZOFRAN-ODT) 4 MG disintegrating tablet      Meds ordered this encounter  Medications  . ondansetron (ZOFRAN-ODT) 4 MG disintegrating tablet    Sig: Take 1 tablet (4 mg total) by mouth every 8 (eight) hours as needed for nausea or vomiting.    Dispense:  30 tablet    Refill:  0   Follow up plan: Return in about 3 months (around 06/29/2020) for DM, A1C F/U.   NHarlin Rain FBucklinFamily Nurse Practitioner [facility] 03/29/2020, 3:38 PM

## 2020-03-29 NOTE — Patient Instructions (Addendum)
Continue all medications as directed.  I have sent in a prescription for ondansetron 4mg  ODT to take 1 tablet every 8 hours as needed for nausea.  Keep your upcoming appointment with the allergist and orthopedic for right knee MRI  You can learn more information online about your diabetes at American Diabetes Association: http://www.diabetes.org/ - General self-care (diet, medications, blood sugar checks). - Diet recommendations - There are even recipes available for you to look at and try.  We will plan to see you back in 3 months for diabetes follow up visit  You will receive a survey after today's visit either digitally by e-mail or paper by [place] mail. Your experiences and feedback matter to [place].  Please respond so we know how we are doing as we provide care for you.  Call us with any questions/concerns/needs.  It is my goal to be available to you for your health concerns.  Thanks for choosing me to be a partner in your healthcare needs!  Harlin Rain, FNP-C Family Nurse Practitioner [facility] Phone: [phone]

## 2020-03-30 ENCOUNTER — Ambulatory Visit: Payer: Medicare Other | Admitting: Occupational Therapy

## 2020-03-30 DIAGNOSIS — I890 Lymphedema, not elsewhere classified: Secondary | ICD-10-CM

## 2020-03-30 NOTE — Therapy (Addendum)
[place] MAIN [facility] SERVICES [address] [place], [place], [address] Phone: [phone]   Fax:  [phone]  Occupational Therapy Treatment  Patient Details  Name: [name] MRN: [id number] Date of Birth: 1945-07-03 Referring Provider (OT): Ida Rogue, MD   Encounter Date: 03/30/2020   OT End of Session - 03/30/20 1502    Visit Number 11   Number of Visits 36    Date for OT Re-Evaluation 05/08/20    OT Start Time 0205    OT Stop Time 0305    OT Time Calculation (min) 60 min    Activity Tolerance Patient tolerated treatment well;No increased pain           Past Medical History:  Diagnosis Date  . Anterolisthesis    Cervical spine  . Asthma   . Connective tissue disorder (HCC)    recurrent carotid arteritis, temporal arteritis, vasculitis mandible, general hepatitis, avascular necrosis bilat  . DDD (degenerative disc disease), lumbar   . Diabetes mellitus without complication ([place])   . Diverticulosis   . Duodenitis   . Gouty arthritis   . Hiatal hernia with GERD   . History of cardiovascular stress test    a. 07/2018 [facility]): Fixed inferoapical defect w/ nl contraction-->attenuation artifact. No ischemia. EF 63%.  . Hyperlipidemia   . Hypertension    a. 02/2019 Renal artery duplex: no evidence of prox RAS.  Marland Kitchen Hyperuricemia   . Keratitis sicca, bilateral ([place])   . Lymphedema of both lower extremities   . Osteoarthritis   . Pericarditis    Recurrent: Aug 97, July 05, Sept 08, July 16, July 18  . PUD (peptic ulcer disease)   . 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[place])   . Sjogren's syndrome ([place])   . Syncope   . Tendonitis, Achilles, right   . Tortuous colon   . Trigger finger     Past Surgical History:  Procedure Laterality Date  . ABDOMINAL HYSTERECTOMY    . BREAST BIOPSY    . BREAST EXCISIONAL BIOPSY Left 1986   neg  . CHOLECYSTECTOMY    . COLONOSCOPY WITH PROPOFOL N/A 08/26/2018   Procedure: COLONOSCOPY WITH  PROPOFOL;  Surgeon: Lucilla Lame, MD;  Location: [facility] ENDOSCOPY;  Service: Endoscopy;  Laterality: N/A;  . CYSTOSCOPY  02/26/2018   [name], MD   . distal arthrectomy    . ESOPHAGOGASTRODUODENOSCOPY (EGD) WITH PROPOFOL N/A 08/26/2018   Procedure: ESOPHAGOGASTRODUODENOSCOPY (EGD) WITH PROPOFOL;  Surgeon: Lucilla Lame, MD;  Location[facility] ENDOSCOPY;  Service: Endoscopy;  Laterality: N/A;  . EXCISION NEUROMA    . KNEE SURGERY Bilateral    1985, 2001, 11/2002, 12/2006  . panhysterectomy  10/1983  . SHOULDER SURGERY Right    reverse total arthroplasty w/ biceps tenodesis  . TONSILLECTOMY AND ADENOIDECTOMY    . TOTAL HIP ARTHROPLASTY Right 04/2007  . WISDOM TOOTH EXTRACTION      There were no vitals filed for this visit.   Subjective Assessment - 03/30/20 1501    Subjective  [name] presents for OT Rx visit  25 of 36 to address BLE lymphedema. Pt presents 0/10 LE related pain today. Pt presents with compression wraps in place. Pt reports she "ripped her knee" on friday and knee pain today is report at 10/10.    Pertinent History PMHx extensive; Dx contributing to leg swelling include OA, B knee and R hip arthroplasties, asthma, HTN. Suspect LE venous insufficiency.    Limitations chronic pain, chronic leg swelling, generalized  weakness, kyphotic posture    Repetition Increases Symptoms    Special Tests + stemmer sign bilaterally base of toes    Patient Stated Goals Reduce LE edema    Pain Onset --   30 yrs                       OT Treatments/Exercises (OP) - 03/30/20 0001      ADLs   ADL Education Given Yes                  OT Education - 03/30/20 1502    Education Details Continued skilled Pt/caregiver education  And LE ADL training throughout visit for lymphedema self care/ home program, including compression wrapping, compression garment and device wear/care, lymphatic pumping ther ex, simple self-MLD, and skin care. Discussed progress towards goals.     Person(s) Educated Patient    Methods Explanation;Demonstration;Handout    Comprehension Verbalized understanding;Returned demonstration               OT Long Term Goals - 03/28/20 1421      OT LONG TERM GOAL #1   Title With modified independent (extra time) Pt will be able to apply knee length, multi-layer, short stretch compression wraps daily from ankle to tibial tuberosity on one leg at a time using correct gradient techniques to return affected limb/s, as closely as possible, to premorbid size and shape, to limit leg pain and infection risk, and to improve safe functional mobility and ADLs performance.    Baseline Dependent    Time 4    Period Days    Status Achieved   achieved 4th visit     OT LONG TERM GOAL #2   Title Pt will be able to verbalize signs and symptoms of cellulitis infection and identify lymphedema precautions using printed resource (modified independence) for reference to decrease infection risk and limit LE progression over time.    Baseline Max A    Time 4    Period Days    Status Achieved      OT LONG TERM GOAL #3   Title Pt will sustain a least 85% compliance with all daily LE self-care home program components throughout Intensive Phase CDT, including impeccable skin care, lymphatic pumping ther ex,  compression wraps and simple self MLD, to ensure optimal limb volume reduction, to limit infection risk and to limit LE progression.    Baseline dependent    Time 12    Period Weeks    Status Achieved   "I'm 98% !"     OT LONG TERM GOAL #4   Title Using assistive devices (modified independence)  Pt will be able to don and doff appropriate daytime compression garments to limit edema re-accumulation and further progression by issue date.    Baseline Max A    Time 12    Period Weeks    Status On-going      OT LONG TERM GOAL #5   Title After skilled teaching Pt  will be able to perform all lymphedema self-care home program components with modified  independence (extra time, assistive devices PRN) , including simple self MLD, lymphatic pumping exercises, don, doff and wear compression garments  daily, sustain   impeccable skin care daily, to limit lymphedema progression and infection risk.    Baseline Max A    Time 12    Period Weeks    Status On-going   Pt is trained for comrpession wrapping skin care and  ther ex. She is still working on learning simple self MLD as of 03/21/20     OT LONG TERM GOAL #6   Title Pt will increase score on FOTO 10 points, from 45 to 55/100, to improve functional performance of basic ADLs.    Baseline Max A    Time 12    Period Weeks    Status On-going                 Plan - 03/30/20 1503    Clinical Impression Statement Pt tolerated MLD, skin care and gradient compression wrap to LLE today. RLE is swollen from toes to mid thigh after knee injury. Pt and OT discussed off-the-shelf vs custom compression garments. We''l continue discussion next visit and finalize recommendations.Cont as per POC    OT Occupational Profile and History Comprehensive Assessment- Review of records and extensive additional review of physical, cognitive, psychosocial history related to current functional performance           OT Occupational Profile and History Comprehensive Assessment- Review of records and extensive additional review of physical, cognitive, psychosocial history related to current functional performance    Occupational performance deficits (Please refer to evaluation for details): ADL's;IADL's;Social Participation;Work;Leisure;Other   body image   Body Structure / Function / Physical Skills ADL;Decreased knowledge of precautions;ROM;Balance;Decreased knowledge of use of DME;Mobility;Edema;Skin integrity;Pain;IADL    Rehab Potential Good    Clinical Decision Making Several treatment options, min-mod task modification necessary    Comorbidities Affecting Occupational Performance: Presence of  comorbidities impacting occupational performance    Modification or Assistance to Complete Evaluation  Min-Moderate modification of tasks or assist with assess necessary to complete eval    OT Frequency 2x / week    OT Duration 12 weeks   and PRN   OT Treatment/Interventions Self-care/ADL training;Therapeutic exercise;Manual lymph drainage;Compression bandaging;Patient/family education;Other (comment);Therapeutic activities;DME and/or AE instruction;Manual Therapy    Plan Intensive Phase CDT: 1 leg at a time to limit fall risk. MLD, skin care, ther ex , cimpression wraps -> daytime garments, HOS device, consider Flexitouch if appropriate    Recommended Other Services Fit w/ appropriate compression garments that are effective, comfortable and  Pt can don using AE and extra time    Consulted and Agree with Plan of Care Patient             Patient will benefit from skilled therapeutic intervention in order to improve the following deficits and impairments:           Visit Diagnosis: Lymphedema, not elsewhere classified    Problem List Patient Active Problem List   Diagnosis Date Noted  . Multiple thyroid nodules 12/28/2019  . Thyroid nodule 09/29/2019  . Lymphedema 11/10/2018  . PAD (peripheral artery disease) ([place]) 10/12/2018  . Aortic atherosclerosis ([place]) 10/12/2018  . Carotid stenosis, asymptomatic, bilateral 10/12/2018  . Positive colorectal cancer screening using Cologuard test 10/09/2018  . Gastroesophageal reflux disease   . Acute gastritis without hemorrhage   . Osteoarthritis 04/29/2018  . Hiatal hernia with GERD 04/29/2018  . Hyperlipidemia 04/29/2018  . Lymphedema of both lower extremities 04/29/2018  . Left knee pain 10/23/2016  . Cataracts, bilateral 07/31/2016  . Glaucoma 07/31/2016  . Lateral epicondylitis of left elbow 10/20/2015  . Vitamin D deficiency 10/20/2015  . Type 2 diabetes, controlled, with neuropathy ([place]) 10/16/2015  . Gouty  arthritis 06/03/2015  . H/O syncope 06/03/2015  . Mild intermittent asthma 06/03/2015  . Multiple gastric ulcers 06/03/2015  . Schatzki's ring 06/03/2015  .  Undifferentiated connective tissue disease ([place]) 06/03/2015  . Bone disorder 04/05/2015  . Dental injury 04/05/2015  . Pericarditis 04/05/2015  . Sjogren's syndrome ([place]) 09/07/2014  . Elevated alkaline phosphatase level 08/24/2014  . Lactose intolerance 08/24/2014  . Obesity 08/24/2014  . Peripheral edema 08/24/2014  . 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 Abdominal adhesions 08/02/2014  . 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[place]) 08/02/2014  . Degenerative joint disease (DJD) of lumbar spine 08/02/2014  . Diverticulosis of both small and large intestine 08/02/2014  . Hyperuricemia 08/02/2014  . Pure hypercholesterolemia 08/02/2014  . Trigger finger 08/02/2014  . Right shoulder pain 07/29/2014  . Benign neoplasm of colon 06/30/2010  . Right upper quadrant pain 06/30/2010    Andrey Spearman, MS, OTR/L, [facility] 03/30/20 3:06 PM  [place] MAIN [facility] SERVICES [address] [place], [place], [address] Phone: [phone]   Fax:  [phone]  Name: Mell Mellott MRN: [id number] Date of Birth: 1946/05/01

## 2020-03-31 ENCOUNTER — Ambulatory Visit: Payer: Medicare Other | Admitting: Physical Therapy

## 2020-03-31 DIAGNOSIS — R110 Nausea: Secondary | ICD-10-CM | POA: Insufficient documentation

## 2020-03-31 LAB — POCT GLYCOSYLATED HEMOGLOBIN (HGB A1C): Hemoglobin A1C: 8.5 % — AB (ref 4.0–5.6)

## 2020-03-31 NOTE — Assessment & Plan Note (Signed)
UncontrolledDM with A1c 8.5% Worsening control from 8.2% on 12/01/2019 and goal A1c < 7.0%. - Complications - peripheral neuropathy and hyperglycemia.  Plan:  1. Continue current therapy: tresiba 56 units daily and humalog sliding scale 2. Encourage improved lifestyle: - low carb/low glycemic diet reinforced prior education - Increase physical activity to 30 minutes most days of the week.  Explained that increased physical activity increases body's use of sugar for energy. 3. Check fasting am CBG and log these.  Bring log to next visit for review 4. Continue ARB and Statin 5. Up to date on DM eye and foot exam 6. Follow-up 3 months

## 2020-03-31 NOTE — Assessment & Plan Note (Signed)
Has met with [facility] on 03/28/2020 for evaluation and treatment, reports is awaiting approval for MRI.

## 2020-03-31 NOTE — Assessment & Plan Note (Addendum)
Intermittent nausea, will treat with ondansetron ODT 4mg  PRN.

## 2020-04-04 ENCOUNTER — Ambulatory Visit: Payer: Medicare Other | Admitting: Occupational Therapy

## 2020-04-04 ENCOUNTER — Other Ambulatory Visit: Payer: Self-pay

## 2020-04-04 DIAGNOSIS — I890 Lymphedema, not elsewhere classified: Secondary | ICD-10-CM

## 2020-04-05 NOTE — Therapy (Addendum)
[place] [facility] SERVICES [address] [place], [place], [address] Phone: [phone]   Fax:  [phone]  Occupational Therapy Treatment  Patient Details  Name: [name] MRN: [id number] Date of Birth: 22-Jun-1945 Referring Provider (OT): Ida Rogue, MD   Encounter Date: 04/04/2020   OT End of Session - 04/05/20 0904    Visit Number 12    Number of Visits 36    Date for OT Re-Evaluation 05/08/20    OT Start Time 0212    OT Stop Time 0315    OT Time Calculation (min) 63 min    Activity Tolerance Patient tolerated treatment well;No increased pain    Behavior During Therapy WFL for tasks assessed/performed           Past Medical History:  Diagnosis Date  . Anterolisthesis    Cervical spine  . Asthma   . Connective tissue disorder (HCC)    recurrent carotid arteritis, temporal arteritis, vasculitis mandible, general hepatitis, avascular necrosis bilat  . DDD (degenerative disc disease), lumbar   . Diabetes mellitus without complication ([place])   . Diverticulosis   . Duodenitis   . Gouty arthritis   . Hiatal hernia with GERD   . History of cardiovascular stress test    a. 07/2018 [facility]): Fixed inferoapical defect w/ nl contraction-->attenuation artifact. No ischemia. EF 63%.  . Hyperlipidemia   . Hypertension    a. 02/2019 Renal artery duplex: no evidence of prox RAS.  Marland Kitchen Hyperuricemia   . Keratitis sicca, bilateral ([place])   . Lymphedema of both lower extremities   . Osteoarthritis   . Pericarditis    Recurrent: Aug 97, July 05, Sept 08, July 16, July 18  . PUD (peptic ulcer disease)   . 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[place])   . Sjogren's syndrome ([place])   . Syncope   . Tendonitis, Achilles, right   . Tortuous colon   . Trigger finger     Past Surgical History:  Procedure Laterality Date  . ABDOMINAL HYSTERECTOMY    . BREAST BIOPSY    . BREAST EXCISIONAL BIOPSY Left 1986   neg  . CHOLECYSTECTOMY    .  COLONOSCOPY WITH PROPOFOL N/A 08/26/2018   Procedure: COLONOSCOPY WITH PROPOFOL;  Surgeon: Lucilla Lame, MD;  Location: [facility] ENDOSCOPY;  Service: Endoscopy;  Laterality: N/A;  . CYSTOSCOPY  02/26/2018   [name], MD   . distal arthrectomy    . ESOPHAGOGASTRODUODENOSCOPY (EGD) WITH PROPOFOL N/A 08/26/2018   Procedure: ESOPHAGOGASTRODUODENOSCOPY (EGD) WITH PROPOFOL;  Surgeon: Lucilla Lame, MD;  Location: [facility] ENDOSCOPY;  Service: Endoscopy;  Laterality: N/A;  . EXCISION NEUROMA    . KNEE SURGERY Bilateral    1985, 2001, 11/2002, 12/2006  . panhysterectomy  10/1983  . SHOULDER SURGERY Right    reverse total arthroplasty w/ biceps tenodesis  . TONSILLECTOMY AND ADENOIDECTOMY    . TOTAL HIP ARTHROPLASTY Right 04/2007  . WISDOM TOOTH EXTRACTION      There were no vitals filed for this visit.   Subjective Assessment - 04/04/20 1417    Subjective  [name] presents for OT Rx visit  12 of 36 to address BLE lymphedema. Pt reports 0/10 LE related pain and R knee pain 7/10.  Pt presents with L knee length compression wraps in place. Pt reports toe wraps applied last visit worked well for reducihng swelling at dorsal foot. Pt awaiting MRI for her R knee.    Pertinent History PMHx extensive; Dx contributing to leg  swelling include OA, B knee and R hip arthroplasties, asthma, HTN. Suspect LE venous insufficiency.    Limitations chronic pain, chronic leg swelling, generalized weakness, kyphotic posture    Repetition Increases Symptoms    Special Tests + stemmer sign bilaterally base of toes    Patient Stated Goals Reduce LE edema    Pain Onset --   30 yrs                       OT Treatments/Exercises (OP) - 04/05/20 0001      ADLs   ADL Education Given Yes                  OT Education - 04/05/20 0904    Education Details Continued skilled Pt/caregiver education  And LE ADL training throughout visit for lymphedema self care/ home program, including compression  wrapping, compression garment and device wear/care, lymphatic pumping ther ex, simple self-MLD, and skin care. Discussed progress towards goals.    Person(s) Educated Patient    Methods Explanation;Demonstration;Handout    Comprehension Verbalized understanding;Returned demonstration               OT Long Term Goals - 03/28/20 1421      OT LONG TERM GOAL #1   Title With modified independent (extra time) Pt will be able to apply knee length, multi-layer, short stretch compression wraps daily from ankle to tibial tuberosity on one leg at a time using correct gradient techniques to return affected limb/s, as closely as possible, to premorbid size and shape, to limit leg pain and infection risk, and to improve safe functional mobility and ADLs performance.    Baseline Dependent    Time 4    Period Days    Status Achieved   achieved 4th visit     OT LONG TERM GOAL #2   Title Pt will be able to verbalize signs and symptoms of cellulitis infection and identify lymphedema precautions using printed resource (modified independence) for reference to decrease infection risk and limit LE progression over time.    Baseline Max A    Time 4    Period Days    Status Achieved      OT LONG TERM GOAL #3   Title Pt will sustain a least 85% compliance with all daily LE self-care home program components throughout Intensive Phase CDT, including impeccable skin care, lymphatic pumping ther ex,  compression wraps and simple self MLD, to ensure optimal limb volume reduction, to limit infection risk and to limit LE progression.    Baseline dependent    Time 12    Period Weeks    Status Achieved   "I'm 98% !"     OT LONG TERM GOAL #4   Title Using assistive devices (modified independence)  Pt will be able to don and doff appropriate daytime compression garments to limit edema re-accumulation and further progression by issue date.    Baseline Max A    Time 12    Period Weeks    Status On-going      OT  LONG TERM GOAL #5   Title After skilled teaching Pt  will be able to perform all lymphedema self-care home program components with modified independence (extra time, assistive devices PRN) , including simple self MLD, lymphatic pumping exercises, don, doff and wear compression garments  daily, sustain   impeccable skin care daily, to limit lymphedema progression and infection risk.    Baseline Max A  Time 12    Period Weeks    Status On-going   Pt is trained for comrpession wrapping skin care and ther ex. She is still working on learning simple self MLD as of 03/21/20     OT LONG TERM GOAL #6   Title Pt will increase score on FOTO 10 points, from 45 to 55/100, to improve functional performance of basic ADLs.    Baseline Max A    Time 12    Period Weeks    Status On-going                 Plan - 04/04/20 1515    Clinical Impression Statement Completed LLE anatomical measurements for custom compression stocking. Assisted Pt with application to [facility] for funds  for compression garments. Applied gradient compression wraps as established. Cont as per POC.    OT Occupational Profile and History Comprehensive Assessment- Review of records and extensive additional review of physical, cognitive, psychosocial history related to current functional performance           OT Occupational Profile and History Comprehensive Assessment- Review of records and extensive additional review of physical, cognitive, psychosocial history related to current functional performance    Occupational performance deficits (Please refer to evaluation for details): ADL's;IADL's;Social Participation;Work;Leisure;Other   body image   Body Structure / Function / Physical Skills ADL;Decreased knowledge of precautions;ROM;Balance;Decreased knowledge of use of DME;Mobility;Edema;Skin integrity;Pain;IADL    Rehab Potential Good    Clinical Decision Making Several treatment options, min-mod task  modification necessary    Comorbidities Affecting Occupational Performance: Presence of comorbidities impacting occupational performance    Modification or Assistance to Complete Evaluation  Min-Moderate modification of tasks or assist with assess necessary to complete eval    OT Frequency 2x / week    OT Duration 12 weeks   and PRN   OT Treatment/Interventions Self-care/ADL training;Therapeutic exercise;Manual lymph drainage;Compression bandaging;Patient/family education;Other (comment);Therapeutic activities;DME and/or AE instruction;Manual Therapy    Plan Intensive Phase CDT: 1 leg at a time to limit fall risk. MLD, skin care, ther ex , cimpression wraps -> daytime garments, HOS device, consider Flexitouch if appropriate    Recommended Other Services Fit w/ appropriate compression garments that are effective, comfortable and  Pt can don using AE and extra time    Consulted and Agree with Plan of Care Patient            Patient will benefit from skilled therapeutic intervention in order to improve the following deficits and impairments:           Visit Diagnosis: Lymphedema, not elsewhere classified    Problem List Patient Active Problem List   Diagnosis Date Noted  . Nausea 03/31/2020  . Multiple thyroid nodules 12/28/2019  . Thyroid nodule 09/29/2019  . Lymphedema 11/10/2018  . PAD (peripheral artery disease) ([place]) 10/12/2018  . Aortic atherosclerosis ([place]) 10/12/2018  . Carotid stenosis, asymptomatic, bilateral 10/12/2018  . Positive colorectal cancer screening using Cologuard test 10/09/2018  . Gastroesophageal reflux disease   . Acute gastritis without hemorrhage   . Osteoarthritis 04/29/2018  . Hiatal hernia with GERD 04/29/2018  . Hyperlipidemia 04/29/2018  . Lymphedema of both lower extremities 04/29/2018  . Left knee pain 10/23/2016  . Cataracts, bilateral 07/31/2016  . Glaucoma 07/31/2016  . Lateral epicondylitis of left elbow 10/20/2015  .  Vitamin D deficiency 10/20/2015  . Type 2 diabetes, controlled, with neuropathy ([place]) 10/16/2015  . Gouty arthritis 06/03/2015  . H/O syncope 06/03/2015  . Mild  intermittent asthma 06/03/2015  . Multiple gastric ulcers 06/03/2015  . Schatzki's ring 06/03/2015  . Undifferentiated connective tissue disease ([place]) 06/03/2015  . Bone disorder 04/05/2015  . Dental injury 04/05/2015  . Pericarditis 04/05/2015  . Sjogren's syndrome ([place]) 09/07/2014  . Elevated alkaline phosphatase level 08/24/2014  . Lactose intolerance 08/24/2014  . Obesity 08/24/2014  . Peripheral edema 08/24/2014  . 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 Abdominal adhesions 08/02/2014  . 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[place]) 08/02/2014  . Degenerative joint disease (DJD) of lumbar spine 08/02/2014  . Diverticulosis of both small and large intestine 08/02/2014  . Hyperuricemia 08/02/2014  . Pure hypercholesterolemia 08/02/2014  . Trigger finger 08/02/2014  . Right shoulder pain 07/29/2014  . Benign neoplasm of colon 06/30/2010  . Right upper quadrant pain 06/30/2010    Andrey Spearman, MS, OTR/L, [facility] 04/05/20 9:07 AM  [place] [facility] SERVICES [address] [place], [place], [address] Phone: [phone]   Fax:  [phone]  Name: [name] MRN: [id number] Date of Birth: December 20, 1945

## 2020-04-06 ENCOUNTER — Ambulatory Visit: Payer: Medicare Other | Admitting: Occupational Therapy

## 2020-04-06 ENCOUNTER — Other Ambulatory Visit: Payer: Self-pay

## 2020-04-06 DIAGNOSIS — S83241A Other tear of medial meniscus, current injury, right knee, initial encounter: Secondary | ICD-10-CM | POA: Diagnosis not present

## 2020-04-06 DIAGNOSIS — S83281A Other tear of lateral meniscus, current injury, right knee, initial encounter: Secondary | ICD-10-CM | POA: Diagnosis not present

## 2020-04-06 DIAGNOSIS — I890 Lymphedema, not elsewhere classified: Secondary | ICD-10-CM | POA: Diagnosis not present

## 2020-04-06 DIAGNOSIS — M1711 Unilateral primary osteoarthritis, right knee: Secondary | ICD-10-CM | POA: Diagnosis not present

## 2020-04-06 NOTE — Therapy (Addendum)
[place] [facility] SERVICES [address] [place], [place], [address] Phone: [phone]   Fax:  [phone]  Occupational Therapy Treatment  Patient Details  Name: [name] MRN: [id number] Date of Birth: 04/02/1946 Referring Provider (OT): Ida Rogue, MD   Encounter Date: 04/06/2020   OT End of Session - 04/06/20 1455    Visit Number 13   Number of Visits 36    Date for OT Re-Evaluation 05/08/20    OT Start Time 0203    OT Stop Time 0305    OT Time Calculation (min) 62 min    Activity Tolerance Patient tolerated treatment well;No increased pain    Behavior During Therapy WFL for tasks assessed/performed           Past Medical History:  Diagnosis Date  . Anterolisthesis    Cervical spine  . Asthma   . Connective tissue disorder (HCC)    recurrent carotid arteritis, temporal arteritis, vasculitis mandible, general hepatitis, avascular necrosis bilat  . DDD (degenerative disc disease), lumbar   . Diabetes mellitus without complication ([place])   . Diverticulosis   . Duodenitis   . Gouty arthritis   . Hiatal hernia with GERD   . History of cardiovascular stress test    a. 07/2018 [facility]): Fixed inferoapical defect w/ nl contraction-->attenuation artifact. No ischemia. EF 63%.  . Hyperlipidemia   . Hypertension    a. 02/2019 Renal artery duplex: no evidence of prox RAS.  Marland Kitchen Hyperuricemia   . Keratitis sicca, bilateral ([place])   . Lymphedema of both lower extremities   . Osteoarthritis   . Pericarditis    Recurrent: Aug 97, July 05, Sept 08, July 16, July 18  . PUD (peptic ulcer disease)   . 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[place])   . Sjogren's syndrome ([place])   . Syncope   . Tendonitis, Achilles, right   . Tortuous colon   . Trigger finger     Past Surgical History:  Procedure Laterality Date  . ABDOMINAL HYSTERECTOMY    . BREAST BIOPSY    . BREAST EXCISIONAL BIOPSY Left 1986   neg  . CHOLECYSTECTOMY    .  COLONOSCOPY WITH PROPOFOL N/A 08/26/2018   Procedure: COLONOSCOPY WITH PROPOFOL;  Surgeon: Lucilla Lame, MD;  Location: [facility] ENDOSCOPY;  Service: Endoscopy;  Laterality: N/A;  . CYSTOSCOPY  02/26/2018   [name], MD   . distal arthrectomy    . ESOPHAGOGASTRODUODENOSCOPY (EGD) WITH PROPOFOL N/A 08/26/2018   Procedure: ESOPHAGOGASTRODUODENOSCOPY (EGD) WITH PROPOFOL;  Surgeon: Lucilla Lame, MD;  Location: [facility] ENDOSCOPY;  Service: Endoscopy;  Laterality: N/A;  . EXCISION NEUROMA    . KNEE SURGERY Bilateral    1985, 2001, 11/2002, 12/2006  . panhysterectomy  10/1983  . SHOULDER SURGERY Right    reverse total arthroplasty w/ biceps tenodesis  . TONSILLECTOMY AND ADENOIDECTOMY    . TOTAL HIP ARTHROPLASTY Right 04/2007  . WISDOM TOOTH EXTRACTION      There were no vitals filed for this visit.   Subjective Assessment - 04/06/20 1410    Subjective  [name] presents for OT Rx visit  27 of 74 to address BLE lymphedema. Pt reports 0/10 LE related pain and R knee pain 8/10.  Pt presents with L knee length compression wraps in place. Pt reports toe wraps worked well last time.    Pertinent History PMHx extensive; Dx contributing to leg swelling include OA, B knee and R hip arthroplasties, asthma, HTN. Suspect LE venous insufficiency.  Limitations chronic pain, chronic leg swelling, generalized weakness, kyphotic posture    Repetition Increases Symptoms    Special Tests + stemmer sign bilaterally base of toes    Patient Stated Goals Reduce LE edema    Pain Onset --   30 yrs                       OT Treatments/Exercises (OP) - 04/06/20 0001      ADLs   ADL Education Given Yes      Manual Therapy   Manual Therapy Edema management;Compression Bandaging    Manual Lymphatic Drainage (MLD) MLD to LLE as established    Compression Bandaging LLE short stretch knee length wraps as established                  OT Education - 04/06/20 1455    Education Details  Continued skilled Pt/caregiver education  And LE ADL training throughout visit for lymphedema self care/ home program, including compression wrapping, compression garment and device wear/care, lymphatic pumping ther ex, simple self-MLD, and skin care. Discussed progress towards goals.    Person(s) Educated Patient    Methods Explanation;Demonstration;Handout    Comprehension Verbalized understanding;Returned demonstration               OT Long Term Goals - 03/28/20 1421      OT LONG TERM GOAL #1   Title With modified independent (extra time) Pt will be able to apply knee length, multi-layer, short stretch compression wraps daily from ankle to tibial tuberosity on one leg at a time using correct gradient techniques to return affected limb/s, as closely as possible, to premorbid size and shape, to limit leg pain and infection risk, and to improve safe functional mobility and ADLs performance.    Baseline Dependent    Time 4    Period Days    Status Achieved   achieved 4th visit     OT LONG TERM GOAL #2   Title Pt will be able to verbalize signs and symptoms of cellulitis infection and identify lymphedema precautions using printed resource (modified independence) for reference to decrease infection risk and limit LE progression over time.    Baseline Max A    Time 4    Period Days    Status Achieved      OT LONG TERM GOAL #3   Title Pt will sustain a least 85% compliance with all daily LE self-care home program components throughout Intensive Phase CDT, including impeccable skin care, lymphatic pumping ther ex,  compression wraps and simple self MLD, to ensure optimal limb volume reduction, to limit infection risk and to limit LE progression.    Baseline dependent    Time 12    Period Weeks    Status Achieved   "I'm 98% !"     OT LONG TERM GOAL #4   Title Using assistive devices (modified independence)  Pt will be able to don and doff appropriate daytime compression garments to limit  edema re-accumulation and further progression by issue date.    Baseline Max A    Time 12    Period Weeks    Status On-going      OT LONG TERM GOAL #5   Title After skilled teaching Pt  will be able to perform all lymphedema self-care home program components with modified independence (extra time, assistive devices PRN) , including simple self MLD, lymphatic pumping exercises, don, doff and wear compression garments  daily,  sustain   impeccable skin care daily, to limit lymphedema progression and infection risk.    Baseline Max A    Time 12    Period Weeks    Status On-going   Pt is trained for comrpession wrapping skin care and ther ex. She is still working on learning simple self MLD as of 03/21/20     OT LONG TERM GOAL #6   Title Pt will increase score on FOTO 10 points, from 45 to 55/100, to improve functional performance of basic ADLs.    Baseline Max A    Time 12    Period Weeks    Status On-going                 Plan - 04/06/20 1456    Clinical Impression Statement LLE has reached clinical plateau for volume reduction. Compression garments on order. No manual Rx to RLE as Pt has injured knee and had MRI today. Pt understands we will not move forward with RLE CDT until dx for knee injury is complete. Pt tolerated MLD and compression to LLE today without increased pain. Cont as per POC.    OT Occupational Profile and History Comprehensive Assessment- Review of records and extensive additional review of physical, cognitive, psychosocial history related to current functional performance           OT Occupational Profile and History Comprehensive Assessment- Review of records and extensive additional review of physical, cognitive, psychosocial history related to current functional performance    Occupational performance deficits (Please refer to evaluation for details): ADL's;IADL's;Social Participation;Work;Leisure;Other   body image   Body Structure / Function / Physical  Skills ADL;Decreased knowledge of precautions;ROM;Balance;Decreased knowledge of use of DME;Mobility;Edema;Skin integrity;Pain;IADL    Rehab Potential Good    Clinical Decision Making Several treatment options, min-mod task modification necessary    Comorbidities Affecting Occupational Performance: Presence of comorbidities impacting occupational performance    Modification or Assistance to Complete Evaluation  Min-Moderate modification of tasks or assist with assess necessary to complete eval    OT Frequency 2x / week    OT Duration 12 weeks   and PRN   OT Treatment/Interventions Self-care/ADL training;Therapeutic exercise;Manual lymph drainage;Compression bandaging;Patient/family education;Other (comment);Therapeutic activities;DME and/or AE instruction;Manual Therapy    Plan Intensive Phase CDT: 1 leg at a time to limit fall risk. MLD, skin care, ther ex , cimpression wraps -> daytime garments, HOS device, consider Flexitouch if appropriate    Recommended Other Services Fit w/ appropriate compression garments that are effective, comfortable and  Pt can don using AE and extra time    Consulted and Agree with Plan of Care Patient             Patient will benefit from skilled therapeutic intervention in order to improve the following deficits and impairments:           Visit Diagnosis: Lymphedema, not elsewhere classified    Problem List Patient Active Problem List   Diagnosis Date Noted  . Nausea 03/31/2020  . Multiple thyroid nodules 12/28/2019  . Thyroid nodule 09/29/2019  . Lymphedema 11/10/2018  . PAD (peripheral artery disease) ([place]) 10/12/2018  . Aortic atherosclerosis ([place]) 10/12/2018  . Carotid stenosis, asymptomatic, bilateral 10/12/2018  . Positive colorectal cancer screening using Cologuard test 10/09/2018  . Gastroesophageal reflux disease   . Acute gastritis without hemorrhage   . Osteoarthritis 04/29/2018  . Hiatal hernia with GERD 04/29/2018   . Hyperlipidemia 04/29/2018  . Lymphedema of both lower extremities 04/29/2018  .  Left knee pain 10/23/2016  . Cataracts, bilateral 07/31/2016  . Glaucoma 07/31/2016  . Lateral epicondylitis of left elbow 10/20/2015  . Vitamin D deficiency 10/20/2015  . Type 2 diabetes, controlled, with neuropathy ([place]) 10/16/2015  . Gouty arthritis 06/03/2015  . H/O syncope 06/03/2015  . Mild intermittent asthma 06/03/2015  . Multiple gastric ulcers 06/03/2015  . Schatzki's ring 06/03/2015  . Undifferentiated connective tissue disease ([place]) 06/03/2015  . Bone disorder 04/05/2015  . Dental injury 04/05/2015  . Pericarditis 04/05/2015  . Sjogren's syndrome ([place]) 09/07/2014  . Elevated alkaline phosphatase level 08/24/2014  . Lactose intolerance 08/24/2014  . Obesity 08/24/2014  . Peripheral edema 08/24/2014  . 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 Abdominal adhesions 08/02/2014  . 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[place]) 08/02/2014  . Degenerative joint disease (DJD) of lumbar spine 08/02/2014  . Diverticulosis of both small and large intestine 08/02/2014  . Hyperuricemia 08/02/2014  . Pure hypercholesterolemia 08/02/2014  . Trigger finger 08/02/2014  . Right shoulder pain 07/29/2014  . Benign neoplasm of colon 06/30/2010  . Right upper quadrant pain 06/30/2010    Andrey Spearman, MS, OTR/L, [facility] 04/06/20 3:04 PM   [place] MAIN [facility] SERVICES [address] [place], [place], [address] Phone: [phone]   Fax:  [phone]  Name: [name] MRN: [id number] Date of Birth: Apr 19, 1946

## 2020-04-07 ENCOUNTER — Encounter: Payer: Self-pay | Admitting: Physical Therapy

## 2020-04-07 ENCOUNTER — Other Ambulatory Visit: Payer: Self-pay

## 2020-04-07 ENCOUNTER — Ambulatory Visit: Payer: Medicare Other

## 2020-04-07 VITALS — BP 142/69 | HR 62

## 2020-04-07 DIAGNOSIS — G8929 Other chronic pain: Secondary | ICD-10-CM

## 2020-04-07 DIAGNOSIS — M545 Low back pain, unspecified: Secondary | ICD-10-CM

## 2020-04-07 DIAGNOSIS — M6281 Muscle weakness (generalized): Secondary | ICD-10-CM

## 2020-04-07 DIAGNOSIS — I890 Lymphedema, not elsewhere classified: Secondary | ICD-10-CM | POA: Diagnosis not present

## 2020-04-07 NOTE — Therapy (Signed)
[place] [facility] [address]. [place], [place], [address] Phone: [phone]   Fax:  [phone]  Physical Therapy Treatment and Progress Note  Dates of reporting period 12/11/2019 to 03/24/2020   Patient Details  Name: [name] MRN: [id number] Date of Birth: Apr 30, 1946 Referring Provider (PT): Dr. Parks Ranger   Encounter Date: 04/07/2020   PT End of Session - 04/07/20 1437    Visit Number 20    Number of Visits 30    Date for PT Re-Evaluation 06/09/20    Authorization - Visit Number 3    Authorization - Number of Visits 10    PT Start Time 1638    PT Stop Time 1513    PT Time Calculation (min) 41 min    Activity Tolerance Patient tolerated treatment well;Patient limited by pain    Behavior During Therapy [facility] for tasks assessed/performed           Past Medical History:  Diagnosis Date  . Anterolisthesis    Cervical spine  . Asthma   . Connective tissue disorder (HCC)    recurrent carotid arteritis, temporal arteritis, vasculitis mandible, general hepatitis, avascular necrosis bilat  . DDD (degenerative disc disease), lumbar   . Diabetes mellitus without complication ([place])   . Diverticulosis   . Duodenitis   . Gouty arthritis   . Hiatal hernia with GERD   . History of cardiovascular stress test    a. 07/2018 [facility]): Fixed inferoapical defect w/ nl contraction-->attenuation artifact. No ischemia. EF 63%.  . Hyperlipidemia   . Hypertension    a. 02/2019 Renal artery duplex: no evidence of prox RAS.  Marland Kitchen Hyperuricemia   . Keratitis sicca, bilateral ([place])   . Lymphedema of both lower extremities   . Osteoarthritis   . Pericarditis    Recurrent: Aug 97, July 05, Sept 08, July 16, July 18  . PUD (peptic ulcer disease)   . 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[place])   . Sjogren's syndrome ([place])   . Syncope   . Tendonitis, Achilles, right   . Tortuous colon   . Trigger finger     Past Surgical History:  Procedure  Laterality Date  . ABDOMINAL HYSTERECTOMY    . BREAST BIOPSY    . BREAST EXCISIONAL BIOPSY Left 1986   neg  . CHOLECYSTECTOMY    . COLONOSCOPY WITH PROPOFOL N/A 08/26/2018   Procedure: COLONOSCOPY WITH PROPOFOL;  Surgeon: Lucilla Lame, MD;  [facility] END[facility]COPY;  Service: Endoscopy;  Laterality: N/A;  . CYSTOSCOPY  02/26/2018   [name], MD   . distal arthrectomy    . ESOPHAGOGASTRODUODENOSCOPY (EGD) WITH PROPOFOL N/A 08/26/2018   Procedure: ESOPHAGOGASTRODUODENOSCOPY (EGD) WITH PROPOFOL;  Surgeon: Lucilla Lame, MD;  Location: [facility] ENDOSCOPY;  Service: Endoscopy;  Laterality: N/A;  . EXCISION NEUROMA    . KNEE SURGERY Bilateral    1985, 2001, 11/2002, 12/2006  . panhysterectomy  10/1983  . SHOULDER SURGERY Right    reverse total arthroplasty w/ biceps tenodesis  . TONSILLECTOMY AND ADENOIDECTOMY    . TOTAL HIP ARTHROPLASTY Right 04/2007  . WISDOM TOOTH EXTRACTION      Vitals:   04/07/20 1441  BP: (!) 142/69  Pulse: 62     Subjective Assessment - 04/07/20 1436    Subjective Pt. reports she received MRI yesterday. Pt. states her R knee has consistently been an 8/10.    Pertinent History Pt. from [place] and came to [place] to take care of friend and then Covid happened.  Pt. retired.  Pts. ex-husband lives in her house in [place].  R shoulder limitations (5# lifting restrictions).  Pt. went to [employer] for 10 years (every week) with benefits reported in low back.    Limitations Standing;Walking;Writing    Patient Stated Goals Decrease back pain/ improve strength/ mobility.    Currently in Pain? Yes    Pain Score 8     Pain Location Knee    Pain Orientation Right    Pain Descriptors / Indicators Aching    Pain Onset --   30 yrs          Manual Therapy: 38 mins seated STM to entire back, spent more time on R side paraspinals due to increased tightness. Pt's low back felt less tight compared to last session, pt's low thoracic paraspinals felt tight as well as upper thoracic paraspinals.  Pt responded well to manual therapy with decreased pain in back felt after session.      PT Long Term Goals - 03/17/20 1440      PT LONG TERM GOAL #1   Title Pt. will be independent with HEP to increase B hip/LE muscle strengthening 1/2 muscle grade to improve pain-free mobility/ upright posture.    Baseline Generalized B LE muscle weakness grossly 4/5 MMT (pain limited L hip/ R knee).; 10/7: R/L Hip flexion 4+/4-, hip abduction 5/5, hip add 5/5, knee extension 4/5, knee flexion 5/5    Time 12    Period Weeks    Status Partially Met    Target Date 06/09/20      PT LONG TERM GOAL #2   Title Pt. will increase FOTO to 58 to improve pain-free mobility.    Baseline Initial FOTO: 53; 10/7: 38  (marked regression noted today)- pt. focused on pain/ swelling in lower legs    Time 12    Period Weeks    Status Not Met    Target Date 06/09/20      PT LONG TERM GOAL #3   Title Pt. will demonstrate/ maintain proper upright posture with standing tolerance with no increase c/o back pain to improve cooking.    Baseline Moderate rounded shoulders/ forward head posture.; 10/7: Maintaned round shoulder and forward head posture.    Time 12    Period Weeks    Status Partially Met    Target Date 06/09/20      PT LONG TERM GOAL #4   Title Pt. will report 4/10 low back pain at worst with no radicular symptoms to improve ADLs/ household chores.    Baseline Pt. reports no pain in back currently at rest but >6/10 pain with increase activity.; 10/7: Pain is > 5/10 NPS with household chores.    Time 12    Period Weeks    Status Not Met    Target Date 06/09/20                 Plan - 04/07/20 1519    Clinical Impression Statement Progress note, pt reassessed goals 03/17/2020. Pt. continues to respond well to manual therapy with reports of decreased pain and improved ability to stair climb with decreased back pain. Pt. states R knee is still very painful and received MRI yesterday. Pt. will continue to  benefit from skilled PT to decrease pain and improve functional mobility.    Personal Factors and Comorbidities Education    Stability/Clinical Decision Making Evolving/Moderate complexity    Clinical Decision Making Moderate    Rehab Potential Fair    PT  Frequency 1x / week    PT Duration 12 weeks    PT Treatment/Interventions ADLs/Self Care Home Management;Electrical Stimulation;Moist Heat;Gait training;Stair training;Functional mobility training;Neuromuscular re-education;Balance training;Therapeutic exercise;Therapeutic activities;Patient/family education;Manual techniques;Passive range of motion    PT Next Visit Plan Recheck BP.  Focus on STM to lumbar/ B hips and pain mgmt.    Consulted and Agree with Plan of Care Patient           Patient will benefit from skilled therapeutic intervention in order to improve the following deficits and impairments:  Abnormal gait, Pain, Improper body mechanics, Postural dysfunction, Decreased mobility, Decreased activity tolerance, Decreased endurance, Decreased range of motion, Decreased strength, Impaired UE functional use, Impaired flexibility, Difficulty walking  Visit Diagnosis: Muscle weakness (generalized)  Chronic bilateral low back pain without sciatica     Problem List Patient Active Problem List   Diagnosis Date Noted  . Nausea 03/31/2020  . Multiple thyroid nodules 12/28/2019  . Thyroid nodule 09/29/2019  . Lymphedema 11/10/2018  . PAD (peripheral artery disease) ([place]) 10/12/2018  . Aortic atherosclerosis ([place]) 10/12/2018  . Carotid stenosis, asymptomatic, bilateral 10/12/2018  . Positive colorectal cancer screening using Cologuard test 10/09/2018  . Gastroesophageal reflux disease   . Acute gastritis without hemorrhage   . Osteoarthritis 04/29/2018  . Hiatal hernia with GERD 04/29/2018  . Hyperlipidemia 04/29/2018  . Lymphedema of both lower extremities 04/29/2018  . Left knee pain 10/23/2016  . Cataracts, bilateral  07/31/2016  . Glaucoma 07/31/2016  . Lateral epicondylitis of left elbow 10/20/2015  . Vitamin D deficiency 10/20/2015  . Type 2 diabetes, controlled, with neuropathy ([place]) 10/16/2015  . Gouty arthritis 06/03/2015  . H/O syncope 06/03/2015  . Mild intermittent asthma 06/03/2015  . Multiple gastric ulcers 06/03/2015  . Schatzki's ring 06/03/2015  . Undifferentiated connective tissue disease ([place]) 06/03/2015  . Bone disorder 04/05/2015  . Dental injury 04/05/2015  . Pericarditis 04/05/2015  . Sjogren's syndrome ([place]) 09/07/2014  . Elevated alkaline phosphatase level 08/24/2014  . Lactose intolerance 08/24/2014  . Obesity 08/24/2014  . Peripheral edema 08/24/2014  . 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 Abdominal adhesions 08/02/2014  . 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[place]) 08/02/2014  . Degenerative joint disease (DJD) of lumbar spine 08/02/2014  . Diverticulosis of both small and large intestine 08/02/2014  . Hyperuricemia 08/02/2014  . Pure hypercholesterolemia 08/02/2014  . Trigger finger 08/02/2014  . Right shoulder pain 07/29/2014  . Benign neoplasm of colon 06/30/2010  . Right upper quadrant pain 06/30/2010    Carlyle Basques, SPT 04/07/2020, 4:25 PM  [place] [facility] [address] [place], [place], [address] Phone: [phone]   Fax:  [phone]  Name: [name] MRN: [id number] Date of Birth: Mar 11, 1946

## 2020-04-11 ENCOUNTER — Ambulatory Visit: Payer: Medicare Other | Attending: Cardiovascular Disease | Admitting: Occupational Therapy

## 2020-04-11 ENCOUNTER — Telehealth: Payer: Self-pay | Admitting: Pharmacist

## 2020-04-11 ENCOUNTER — Telehealth: Payer: PRIVATE HEALTH INSURANCE

## 2020-04-11 ENCOUNTER — Other Ambulatory Visit: Payer: Self-pay

## 2020-04-11 DIAGNOSIS — I890 Lymphedema, not elsewhere classified: Secondary | ICD-10-CM | POA: Diagnosis not present

## 2020-04-11 DIAGNOSIS — M1711 Unilateral primary osteoarthritis, right knee: Secondary | ICD-10-CM | POA: Diagnosis not present

## 2020-04-11 NOTE — Therapy (Addendum)
[place] MAIN [facility] SERVICES [address] [place], [place], [address] Phone: [phone]   Fax:  [phone]  Occupational Therapy Treatment  Patient Details  Name: [name] MRN: [id number] Date of Birth: [birth year] Referring Provider (OT): Ida Rogue, MD   Encounter Date: 04/11/2020   OT End of Session - 04/11/20 1459    Visit Number 14    Number of Visits 36    Date for OT Re-Evaluation 05/08/20    OT Start Time 0200    OT Stop Time 0300    OT Time Calculation (min) 60 min    Activity Tolerance Patient tolerated treatment well;No increased pain    Behavior During Therapy WFL for tasks assessed/performed           Past Medical History:  Diagnosis Date  . Anterolisthesis    Cervical spine  . Asthma   . Connective tissue disorder (HCC)    recurrent carotid arteritis, temporal arteritis, vasculitis mandible, general hepatitis, avascular necrosis bilat  . DDD (degenerative disc disease), lumbar   . Diabetes mellitus without complication ([place])   . Diverticulosis   . Duodenitis   . Gouty arthritis   . Hiatal hernia with GERD   . History of cardiovascular stress test    a. 07/2018 [facility]): Fixed inferoapical defect w/ nl contraction-->attenuation artifact. No ischemia. EF 63%.  . Hyperlipidemia   . Hypertension    a. 02/2019 Renal artery duplex: no evidence of prox RAS.  Marland Kitchen Hyperuricemia   . Keratitis sicca, bilateral ([place])   . Lymphedema of both lower extremities   . Osteoarthritis   . Pericarditis    Recurrent: Aug 97, July 05, Sept 08, July 16, July 18  . PUD (peptic ulcer disease)   . 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[place])   . Sjogren's syndrome ([place])   . Syncope   . Tendonitis, Achilles, right   . Tortuous colon   . Trigger finger     Past Surgical History:  Procedure Laterality Date  . ABDOMINAL HYSTERECTOMY    . BREAST BIOPSY    . BREAST EXCISIONAL BIOPSY Left 1986   neg  . CHOLECYSTECTOMY    .  COLONOSCOPY WITH PROPOFOL N/A 08/26/2018   Procedure: COLONOSCOPY WITH PROPOFOL;  Surgeon: Lucilla Lame, MD;  [facility] ENDOSCOPY;  Service: Endoscopy;  Laterality: N/A;  . CYSTOSCOPY  02/26/2018   [name], MD   . distal arthrectomy    . ESOPHAGOGASTRODUODENOSCOPY (EGD) WITH PROPOFOL N/A 08/26/2018   Procedure: ESOPHAGOGASTRODUODENOSCOPY (EGD) WITH PROPOFOL;  Surgeon: Lucilla Lame, MD;  Location: [facility] ENDOSCOPY;  Service: Endoscopy;  Laterality: N/A;  . EXCISION NEUROMA    . KNEE SURGERY Bilateral    1985, 2001, 11/2002, 12/2006  . panhysterectomy  10/1983  . SHOULDER SURGERY Right    reverse total arthroplasty w/ biceps tenodesis  . TONSILLECTOMY AND ADENOIDECTOMY    . TOTAL HIP ARTHROPLASTY Right 04/2007  . WISDOM TOOTH EXTRACTION      There were no vitals filed for this visit.   Subjective Assessment - 04/11/20 1403    Subjective  [name] presents for OT Rx visit  14/36 to address BLE lymphedema. Pt reports 0/10 LE related pain and R knee pain 5/10.  Pt presents with L knee length compression wraps in place and knee brace on R knee.    Pertinent History PMHx extensive; Dx contributing to leg swelling include OA, B knee and R hip arthroplasties, asthma, HTN. Suspect LE venous insufficiency.  Limitations chronic pain, chronic leg swelling, generalized weakness, kyphotic posture    Repetition Increases Symptoms    Special Tests + stemmer sign bilaterally base of toes    Patient Stated Goals Reduce LE edema    Pain Onset --   30 yrs                       OT Treatments/Exercises (OP) - 04/11/20 0001      ADLs   ADL Education Given Yes      Manual Therapy   Manual Therapy Edema management;Manual Lymphatic Drainage (MLD);Compression Bandaging    Manual Lymphatic Drainage (MLD) MLD to LLE as established    Compression Bandaging LLE short stretch knee length wraps as established                  OT Education - 04/11/20 1407    Education  Details Continued skilled Pt/caregiver education  And LE ADL training throughout visit for lymphedema self care/ home program, including compression wrapping, compression garment and device wear/care, lymphatic pumping ther ex, simple self-MLD, and skin care. Discussed progress towards goals.    Person(s) Educated Patient    Methods Explanation;Demonstration;Handout    Comprehension Verbalized understanding;Returned demonstration               OT Long Term Goals - 03/28/20 1421      OT LONG TERM GOAL #1   Title With modified independent (extra time) Pt will be able to apply knee length, multi-layer, short stretch compression wraps daily from ankle to tibial tuberosity on one leg at a time using correct gradient techniques to return affected limb/s, as closely as possible, to premorbid size and shape, to limit leg pain and infection risk, and to improve safe functional mobility and ADLs performance.    Baseline Dependent    Time 4    Period Days    Status Achieved   achieved 4th visit     OT LONG TERM GOAL #2   Title Pt will be able to verbalize signs and symptoms of cellulitis infection and identify lymphedema precautions using printed resource (modified independence) for reference to decrease infection risk and limit LE progression over time.    Baseline Max A    Time 4    Period Days    Status Achieved      OT LONG TERM GOAL #3   Title Pt will sustain a least 85% compliance with all daily LE self-care home program components throughout Intensive Phase CDT, including impeccable skin care, lymphatic pumping ther ex,  compression wraps and simple self MLD, to ensure optimal limb volume reduction, to limit infection risk and to limit LE progression.    Baseline dependent    Time 12    Period Weeks    Status Achieved   "I'm 98% !"     OT LONG TERM GOAL #4   Title Using assistive devices (modified independence)  Pt will be able to don and doff appropriate daytime compression garments  to limit edema re-accumulation and further progression by issue date.    Baseline Max A    Time 12    Period Weeks    Status On-going      OT LONG TERM GOAL #5   Title After skilled teaching Pt  will be able to perform all lymphedema self-care home program components with modified independence (extra time, assistive devices PRN) , including simple self MLD, lymphatic pumping exercises, don, doff and wear compression  garments  daily, sustain   impeccable skin care daily, to limit lymphedema progression and infection risk.    Baseline Max A    Time 12    Period Weeks    Status On-going   Pt is trained for comrpession wrapping skin care and ther ex. She is still working on learning simple self MLD as of 03/21/20     OT LONG TERM GOAL #6   Title Pt will increase score on FOTO 10 points, from 45 to 55/100, to improve functional performance of basic ADLs.    Baseline Max A    Time 12    Period Weeks    Status On-going                 Plan - 04/11/20 1500    Clinical Impression Statement Pt tolerated LLE MLD and compression wraps without increased pain today. Leg swelling is significantly decreased since commencing Rx, but swelling at dorsal foot remains stubborn with pitting edema. RLE worsened from base of toes to groin. Pt sees orthopedic doc after our session today to discuss knee issues. Cont as per POC.    Occupational performance deficits (Please refer to evaluation for details): ADL's;IADL's;Social Participation;Work;Leisure;Other           OT Occupational Profile and History Comprehensive Assessment- Review of records and extensive additional review of physical, cognitive, psychosocial history related to current functional performance    Occupational performance deficits (Please refer to evaluation for details): ADL's;IADL's;Social Participation;Work;Leisure;Other   body image   Body Structure / Function / Physical Skills ADL;Decreased knowledge of  precautions;ROM;Balance;Decreased knowledge of use of DME;Mobility;Edema;Skin integrity;Pain;IADL    Rehab Potential Good    Clinical Decision Making Several treatment options, min-mod task modification necessary    Comorbidities Affecting Occupational Performance: Presence of comorbidities impacting occupational performance    Modification or Assistance to Complete Evaluation  Min-Moderate modification of tasks or assist with assess necessary to complete eval    OT Frequency 2x / week    OT Duration 12 weeks   and PRN   OT Treatment/Interventions Self-care/ADL training;Therapeutic exercise;Manual lymph drainage;Compression bandaging;Patient/family education;Other (comment);Therapeutic activities;DME and/or AE instruction;Manual Therapy    Plan Intensive Phase CDT: 1 leg at a time to limit fall risk. MLD, skin care, ther ex , cimpression wraps -> daytime garments, HOS device, consider Flexitouch if appropriate    Recommended Other Services Fit w/ appropriate compression garments that are effective, comfortable and  Pt can don using AE and extra time    Consulted and Agree with Plan of Care Patient             Patient will benefit from skilled therapeutic intervention in order to improve the following deficits and impairments:           Visit Diagnosis: Lymphedema, not elsewhere classified    Problem List Patient Active Problem List   Diagnosis Date Noted  . Nausea 03/31/2020  . Multiple thyroid nodules 12/28/2019  . Thyroid nodule 09/29/2019  . Lymphedema 11/10/2018  . PAD (peripheral artery disease) ([place]) 10/12/2018  . Aortic atherosclerosis ([place]) 10/12/2018  . Carotid stenosis, asymptomatic, bilateral 10/12/2018  . Positive colorectal cancer screening using Cologuard test 10/09/2018  . Gastroesophageal reflux disease   . Acute gastritis without hemorrhage   . Osteoarthritis 04/29/2018  . Hiatal hernia with GERD 04/29/2018  . Hyperlipidemia 04/29/2018  .  Lymphedema of both lower extremities 04/29/2018  . Left knee pain 10/23/2016  . Cataracts, bilateral 07/31/2016  . Glaucoma 07/31/2016  .  Lateral epicondylitis of left elbow 10/20/2015  . Vitamin D deficiency 10/20/2015  . Type 2 diabetes, controlled, with neuropathy ([place]) 10/16/2015  . Gouty arthritis 06/03/2015  . H/O syncope 06/03/2015  . Mild intermittent asthma 06/03/2015  . Multiple gastric ulcers 06/03/2015  . Schatzki's ring 06/03/2015  . Undifferentiated connective tissue disease ([place]) 06/03/2015  . Bone disorder 04/05/2015  . Dental injury 04/05/2015  . Pericarditis 04/05/2015  . Sjogren's syndrome ([place]) 09/07/2014  . Elevated alkaline phosphatase level 08/24/2014  . Lactose intolerance 08/24/2014  . Obesity 08/24/2014  . Peripheral edema 08/24/2014  . 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 Abdominal adhesions 08/02/2014  . 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[place]) 08/02/2014  . Degenerative joint disease (DJD) of lumbar spine 08/02/2014  . Diverticulosis of both small and large intestine 08/02/2014  . Hyperuricemia 08/02/2014  . Pure hypercholesterolemia 08/02/2014  . Trigger finger 08/02/2014  . Right shoulder pain 07/29/2014  . Benign neoplasm of colon 06/30/2010  . Right upper quadrant pain 06/30/2010    Andrey Spearman, MS, OTR/L, [facility] 04/11/20 3:03 PM  [place] MAIN [facility] SERVICES [address] [place], [place], [address] Phone: [phone]   Fax:  [phone]  Name: [name] MRN: [id number] Date of Birth: Mar 12, 1946

## 2020-04-11 NOTE — Chronic Care Management (AMB) (Signed)
  Chronic Care Management   Outreach Note  04/11/2020 Name: Katonya Blecher MRN: [id number] DOB: [birth year]  Referred by: Verl Bangs, FNP Reason for referral : No chief complaint on file.   Was unable to reach patient via telephone today and have left HIPAA compliant voicemail asking patient to return my call.    Follow Up Plan: Will collaborate with Care Guide to outreach to schedule follow up with me  Harlow Asa, PharmD, [place] [facility] [phone]

## 2020-04-13 ENCOUNTER — Other Ambulatory Visit: Payer: Self-pay

## 2020-04-13 ENCOUNTER — Ambulatory Visit: Payer: Medicare Other | Admitting: Occupational Therapy

## 2020-04-13 DIAGNOSIS — I890 Lymphedema, not elsewhere classified: Secondary | ICD-10-CM | POA: Diagnosis not present

## 2020-04-13 NOTE — Therapy (Addendum)
[place] MAIN [facility] SERVICES [address] [place], [place], [address] Phone: [phone]   Fax:  [phone]  Occupational Therapy Treatment  Patient Details  Name: [name] MRN: [id number] Date of Birth: Nov 02, 1945 Referring Provider (OT): Ida Rogue, MD   Encounter Date: 04/13/2020   OT End of Session - 04/13/20 1454    Visit Number 15   Number of Visits 36    Date for OT Re-Evaluation 05/08/20    OT Start Time 0205    OT Stop Time 0305    OT Time Calculation (min) 60 min    Activity Tolerance Patient tolerated treatment well;No increased pain    Behavior During Therapy WFL for tasks assessed/performed           Past Medical History:  Diagnosis Date  . Anterolisthesis    Cervical spine  . Asthma   . Connective tissue disorder (HCC)    recurrent carotid arteritis, temporal arteritis, vasculitis mandible, general hepatitis, avascular necrosis bilat  . DDD (degenerative disc disease), lumbar   . Diabetes mellitus without complication ([place])   . Diverticulosis   . Duodenitis   . Gouty arthritis   . Hiatal hernia with GERD   . History of cardiovascular stress test    a. 07/2018 [facility]): Fixed inferoapical defect w/ nl contraction-->attenuation artifact. No ischemia. EF 63%.  . Hyperlipidemia   . Hypertension    a. 02/2019 Renal artery duplex: no evidence of prox RAS.  Marland Kitchen Hyperuricemia   . Keratitis sicca, bilateral ([place])   . Lymphedema of both lower extremities   . Osteoarthritis   . Pericarditis    Recurrent: Aug 97, July 05, Sept 08, July 16, July 18  . PUD (peptic ulcer disease)   . 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[place])   . Sjogren's syndrome ([place])   . Syncope   . Tendonitis, Achilles, right   . Tortuous colon   . Trigger finger     Past Surgical History:  Procedure Laterality Date  . ABDOMINAL HYSTERECTOMY    . BREAST BIOPSY    . BREAST EXCISIONAL BIOPSY Left 1986   neg  . CHOLECYSTECTOMY    .  COLONOSCOPY WITH PROPOFOL N/A 08/26/2018   Procedure: COLONOSCOPY WITH PROPOFOL;  Surgeon: Lucilla Lame, MD;  Location: [facility] ENDOSCOPY;  Service: Endoscopy;  Laterality: N/A;  . CYSTOSCOPY  02/26/2018   [name], MD   . distal arthrectomy    . ESOPHAGOGASTRODUODENOSCOPY (EGD) WITH PROPOFOL N/A 08/26/2018   Procedure: ESOPHAGOGASTRODUODENOSCOPY (EGD) WITH PROPOFOL;  Surgeon: Lucilla Lame, MD;  Location: [facility] ENDOSCOPY;  Service: Endoscopy;  Laterality: N/A;  . EXCISION NEUROMA    . KNEE SURGERY Bilateral    1985, 2001, 11/2002, 12/2006  . panhysterectomy  10/1983  . SHOULDER SURGERY Right    reverse total arthroplasty w/ biceps tenodesis  . TONSILLECTOMY AND ADENOIDECTOMY    . TOTAL HIP ARTHROPLASTY Right 04/2007  . WISDOM TOOTH EXTRACTION      There were no vitals filed for this visit.   Subjective Assessment - 04/13/20 1416    Subjective  [name] presents for OT Rx visit  15/36 to address BLE lymphedema. Pt reports 1/10 great toe pain, and R knee pain 6/10.  Pt reports R MRI necessitates TKA. Physician recommends surgery after first of the year.    Pertinent History PMHx extensive; Dx contributing to leg swelling include OA, B knee and R hip arthroplasties, asthma, HTN. Suspect LE venous insufficiency.    Limitations chronic pain,  chronic leg swelling, generalized weakness, kyphotic posture    Repetition Increases Symptoms    Special Tests + stemmer sign bilaterally base of toes    Patient Stated Goals Reduce LE edema    Pain Onset --   30 yrs                       OT Treatments/Exercises (OP) - 04/13/20 0001      ADLs   ADL Education Given Yes      Manual Therapy   Manual Therapy Edema management;Manual Lymphatic Drainage (MLD);Compression Bandaging    Manual Lymphatic Drainage (MLD) MLD to LLE as established    Compression Bandaging LLE short stretch knee length wraps as established                  OT Education - 04/13/20 1454    Education  Details Continued skilled Pt/caregiver education  And LE ADL training throughout visit for lymphedema self care/ home program, including compression wrapping, compression garment and device wear/care, lymphatic pumping ther ex, simple self-MLD, and skin care. Discussed progress towards goals.    Person(s) Educated Patient    Methods Explanation;Demonstration;Handout    Comprehension Verbalized understanding;Returned demonstration               OT Long Term Goals - 03/28/20 1421      OT LONG TERM GOAL #1   Title With modified independent (extra time) Pt will be able to apply knee length, multi-layer, short stretch compression wraps daily from ankle to tibial tuberosity on one leg at a time using correct gradient techniques to return affected limb/s, as closely as possible, to premorbid size and shape, to limit leg pain and infection risk, and to improve safe functional mobility and ADLs performance.    Baseline Dependent    Time 4    Period Days    Status Achieved   achieved 4th visit     OT LONG TERM GOAL #2   Title Pt will be able to verbalize signs and symptoms of cellulitis infection and identify lymphedema precautions using printed resource (modified independence) for reference to decrease infection risk and limit LE progression over time.    Baseline Max A    Time 4    Period Days    Status Achieved      OT LONG TERM GOAL #3   Title Pt will sustain a least 85% compliance with all daily LE self-care home program components throughout Intensive Phase CDT, including impeccable skin care, lymphatic pumping ther ex,  compression wraps and simple self MLD, to ensure optimal limb volume reduction, to limit infection risk and to limit LE progression.    Baseline dependent    Time 12    Period Weeks    Status Achieved   "I'm 98% !"     OT LONG TERM GOAL #4   Title Using assistive devices (modified independence)  Pt will be able to don and doff appropriate daytime compression garments  to limit edema re-accumulation and further progression by issue date.    Baseline Max A    Time 12    Period Weeks    Status On-going      OT LONG TERM GOAL #5   Title After skilled teaching Pt  will be able to perform all lymphedema self-care home program components with modified independence (extra time, assistive devices PRN) , including simple self MLD, lymphatic pumping exercises, don, doff and wear compression garments  daily,  sustain   impeccable skin care daily, to limit lymphedema progression and infection risk.    Baseline Max A    Time 12    Period Weeks    Status On-going   Pt is trained for comrpession wrapping skin care and ther ex. She is still working on learning simple self MLD as of 03/21/20     OT LONG TERM GOAL #6   Title Pt will increase score on FOTO 10 points, from 45 to 55/100, to improve functional performance of basic ADLs.    Baseline Max A    Time 12    Period Weeks    Status On-going                 Plan - 04/13/20 1455    Clinical Impression Statement Pt tolerated MLD, skin care and gradient compression wrap to LLE with no increased pain. Pt continues to wear knee brace on R knee. Fit L custom compression garment as soon as available and ommence cdt to RLE.    OT Occupational Profile and History Comprehensive Assessment- Review of records and extensive additional review of physical, cognitive, psychosocial history related to current functional performance           OT Occupational Profile and History Comprehensive Assessment- Review of records and extensive additional review of physical, cognitive, psychosocial history related to current functional performance    Occupational performance deficits (Please refer to evaluation for details): ADL's;IADL's;Social Participation;Work;Leisure;Other   body image   Body Structure / Function / Physical Skills ADL;Decreased knowledge of precautions;ROM;Balance;Decreased knowledge of use of  DME;Mobility;Edema;Skin integrity;Pain;IADL    Rehab Potential Good    Clinical Decision Making Several treatment options, min-mod task modification necessary    Comorbidities Affecting Occupational Performance: Presence of comorbidities impacting occupational performance    Modification or Assistance to Complete Evaluation  Min-Moderate modification of tasks or assist with assess necessary to complete eval    OT Frequency 2x / week    OT Duration 12 weeks   and PRN   OT Treatment/Interventions Self-care/ADL training;Therapeutic exercise;Manual lymph drainage;Compression bandaging;Patient/family education;Other (comment);Therapeutic activities;DME and/or AE instruction;Manual Therapy    Plan Intensive Phase CDT: 1 leg at a time to limit fall risk. MLD, skin care, ther ex , cimpression wraps -> daytime garments, HOS device, consider Flexitouch if appropriate    Recommended Other Services Fit w/ appropriate compression garments that are effective, comfortable and  Pt can don using AE and extra time    Consulted and Agree with Plan of Care Patient             Patient will benefit from skilled therapeutic intervention in order to improve the following deficits and impairments:           Visit Diagnosis: Lymphedema, not elsewhere classified    Problem List Patient Active Problem List   Diagnosis Date Noted  . Nausea 03/31/2020  . Multiple thyroid nodules 12/28/2019  . Thyroid nodule 09/29/2019  . Lymphedema 11/10/2018  . PAD (peripheral artery disease) ([place]) 10/12/2018  . Aortic atherosclerosis ([place]) 10/12/2018  . Carotid stenosis, asymptomatic, bilateral 10/12/2018  . Positive colorectal cancer screening using Cologuard test 10/09/2018  . Gastroesophageal reflux disease   . Acute gastritis without hemorrhage   . Osteoarthritis 04/29/2018  . Hiatal hernia with GERD 04/29/2018  . Hyperlipidemia 04/29/2018  . Lymphedema of both lower extremities 04/29/2018  . Left  knee pain 10/23/2016  . Cataracts, bilateral 07/31/2016  . Glaucoma 07/31/2016  . Lateral epicondylitis of left  elbow 10/20/2015  . Vitamin D deficiency 10/20/2015  . Type 2 diabetes, controlled, with neuropathy ([place]) 10/16/2015  . Gouty arthritis 06/03/2015  . H/O syncope 06/03/2015  . Mild intermittent asthma 06/03/2015  . Multiple gastric ulcers 06/03/2015  . Schatzki's ring 06/03/2015  . Undifferentiated connective tissue disease ([place]) 06/03/2015  . Bone disorder 04/05/2015  . Dental injury 04/05/2015  . Pericarditis 04/05/2015  . Sjogren's syndrome ([place]) 09/07/2014  . Elevated alkaline phosphatase level 08/24/2014  . Lactose intolerance 08/24/2014  . Obesity 08/24/2014  . Peripheral edema 08/24/2014  . 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 Abdominal adhesions 08/02/2014  . 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[place]) 08/02/2014  . Degenerative joint disease (DJD) of lumbar spine 08/02/2014  . Diverticulosis of both small and large intestine 08/02/2014  . Hyperuricemia 08/02/2014  . Pure hypercholesterolemia 08/02/2014  . Trigger finger 08/02/2014  . Right shoulder pain 07/29/2014  . Benign neoplasm of colon 06/30/2010  . Right upper quadrant pain 06/30/2010    Andrey Spearman, MS, OTR/L, [facility] 04/13/20 2:58 PM  [place] MAIN [facility] SERVICES [address] [place], [place], [address] Phone: [phone]   Fax:  [phone]  Name: [name] MRN: [id number] Date of Birth: 12-Nov-1945

## 2020-04-14 ENCOUNTER — Encounter: Payer: Self-pay | Admitting: Physical Therapy

## 2020-04-14 ENCOUNTER — Ambulatory Visit: Payer: Medicare Other | Attending: Cardiovascular Disease

## 2020-04-14 ENCOUNTER — Other Ambulatory Visit: Payer: Self-pay

## 2020-04-14 VITALS — BP 107/52

## 2020-04-14 DIAGNOSIS — G8929 Other chronic pain: Secondary | ICD-10-CM | POA: Diagnosis not present

## 2020-04-14 DIAGNOSIS — M25661 Stiffness of right knee, not elsewhere classified: Secondary | ICD-10-CM | POA: Insufficient documentation

## 2020-04-14 DIAGNOSIS — M545 Low back pain, unspecified: Secondary | ICD-10-CM | POA: Insufficient documentation

## 2020-04-14 DIAGNOSIS — M25561 Pain in right knee: Secondary | ICD-10-CM | POA: Diagnosis not present

## 2020-04-14 DIAGNOSIS — M6281 Muscle weakness (generalized): Secondary | ICD-10-CM | POA: Insufficient documentation

## 2020-04-14 NOTE — Therapy (Signed)
[place] [facility] [address] [place], [place], [address] Phone: [phone]   Fax:  [phone]  Physical Therapy Treatment  Patient Details  Name: [name] MRN: [id number] Date of Birth: 1945-10-20 Referring Provider (PT): Dr. Parks Ranger   Encounter Date: 04/14/2020   PT End of Session - 04/14/20 1444    Visit Number 21    Number of Visits 30    Date for PT Re-Evaluation 06/09/20    Authorization - Visit Number 4    Authorization - Number of Visits 10    PT Start Time 0947    PT Stop Time 1514    PT Time Calculation (min) 38 min    Activity Tolerance Patient tolerated treatment well;Patient limited by pain    Behavior During Therapy [facility] for tasks assessed/performed           Past Medical History:  Diagnosis Date  . Anterolisthesis    Cervical spine  . Asthma   . Connective tissue disorder (HCC)    recurrent carotid arteritis, temporal arteritis, vasculitis mandible, general hepatitis, avascular necrosis bilat  . DDD (degenerative disc disease), lumbar   . Diabetes mellitus without complication ([place])   . Diverticulosis   . Duodenitis   . Gouty arthritis   . Hiatal hernia with GERD   . History of cardiovascular stress test    a. 07/2018 [facility]): Fixed inferoapical defect w/ nl contraction-->attenuation artifact. No ischemia. EF 63%.  . Hyperlipidemia   . Hypertension    a. 02/2019 Renal artery duplex: no evidence of prox RAS.  Marland Kitchen Hyperuricemia   . Keratitis sicca, bilateral ([place])   . Lymphedema of both lower extremities   . Osteoarthritis   . Pericarditis    Recurrent: Aug 97, July 05, Sept 08, July 16, July 18  . PUD (peptic ulcer disease)   . 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[place])   . Sjogren's syndrome ([place])   . Syncope   . Tendonitis, Achilles, right   . Tortuous colon   . Trigger finger     Past Surgical History:  Procedure Laterality Date  . ABDOMINAL HYSTERECTOMY    . BREAST BIOPSY    . BREAST  EXCISIONAL BIOPSY Left 1986   neg  . CHOLECYSTECTOMY    . COLONOSCOPY WITH PROPOFOL N/A 08/26/2018   Procedure: COLONOSCOPY WITH PROPOFOL;  Surgeon: Lucilla Lame, MD;  Location: [facility] ENDOSCOPY;  Service: Endoscopy;  Laterality: N/A;  . CYSTOSCOPY  02/26/2018   [name], MD   . distal arthrectomy    . ESOPHAGOGASTRODUODENOSCOPY (EGD) WITH PROPOFOL N/A 08/26/2018   Procedure: ESOPHAGOGASTRODUODENOSCOPY (EGD) WITH PROPOFOL;  Surgeon: Lucilla Lame, MD;  Location: [facility] ENDOSCOPY;  Service: Endoscopy;  Laterality: N/A;  . EXCISION NEUROMA    . KNEE SURGERY Bilateral    1985, 2001, 11/2002, 12/2006  . panhysterectomy  10/1983  . SHOULDER SURGERY Right    reverse total arthroplasty w/ biceps tenodesis  . TONSILLECTOMY AND ADENOIDECTOMY    . TOTAL HIP ARTHROPLASTY Right 04/2007  . WISDOM TOOTH EXTRACTION      Vitals:   04/14/20 1445  BP: (!) 107/52     Subjective Assessment - 04/14/20 1440    Subjective Pt. states that she got her MRI results, MD states that she may need a L knee replacement. She does not want surgery.    Limitations Standing;Walking;Writing    Patient Stated Goals Decrease back pain/ improve strength/ mobility.    Currently in Pain? Yes    Pain Score  4     Pain Location Back    Pain Orientation Lower    Pain Descriptors / Indicators Aching    Pain Onset --   30 yrs             Manual Therapy: 38 mins seated STM to entire back, spent more time on R side paraspinals due to increased tightness. Pt's low back felt less tight compared to last session, pt's low thoracic paraspinals felt tight as well as upper thoracic paraspinals. Pt responded well to manual therapy with decreased pain in back felt after session.                              PT Long Term Goals - 03/17/20 1440      PT LONG TERM GOAL #1   Title Pt. will be independent with HEP to increase B hip/LE muscle strengthening 1/2 muscle grade to improve pain-free mobility/ upright  posture.    Baseline Generalized B LE muscle weakness grossly 4/5 MMT (pain limited L hip/ R knee).; 10/7: R/L Hip flexion 4+/4-, hip abduction 5/5, hip add 5/5, knee extension 4/5, knee flexion 5/5    Time 12    Period Weeks    Status Partially Met    Target Date 06/09/20      PT LONG TERM GOAL #2   Title Pt. will increase FOTO to 58 to improve pain-free mobility.    Baseline Initial FOTO: 53; 10/7: 38  (marked regression noted today)- pt. focused on pain/ swelling in lower legs    Time 12    Period Weeks    Status Not Met    Target Date 06/09/20      PT LONG TERM GOAL #3   Title Pt. will demonstrate/ maintain proper upright posture with standing tolerance with no increase c/o back pain to improve cooking.    Baseline Moderate rounded shoulders/ forward head posture.; 10/7: Maintaned round shoulder and forward head posture.    Time 12    Period Weeks    Status Partially Met    Target Date 06/09/20      PT LONG TERM GOAL #4   Title Pt. will report 4/10 low back pain at worst with no radicular symptoms to improve ADLs/ household chores.    Baseline Pt. reports no pain in back currently at rest but >6/10 pain with increase activity.; 10/7: Pain is > 5/10 NPS with household chores.    Time 12    Period Weeks    Status Not Met    Target Date 06/09/20                 Plan - 04/14/20 1514    Clinical Impression Statement Pt. continues to report decreased pain with stair climbing after receiveing manual therapy to back. Pt. received MRI results for her R knee, pt and PT briefly discussed results. Pt. will continue to benefit from skilled PT to decrease pain and improve functional mobility.    Personal Factors and Comorbidities Education    Stability/Clinical Decision Making Evolving/Moderate complexity    Clinical Decision Making Moderate    Rehab Potential Fair    PT Frequency 1x / week    PT Duration 12 weeks    PT Treatment/Interventions ADLs/Self Care Home  Management;Electrical Stimulation;Moist Heat;Gait training;Stair training;Functional mobility training;Neuromuscular re-education;Balance training;Therapeutic exercise;Therapeutic activities;Patient/family education;Manual techniques;Passive range of motion    PT Next Visit Plan Recheck BP.  Focus on STM  to lumbar/ B hips and pain mgmt.    Consulted and Agree with Plan of Care Patient           Patient will benefit from skilled therapeutic intervention in order to improve the following deficits and impairments:  Abnormal gait, Pain, Improper body mechanics, Postural dysfunction, Decreased mobility, Decreased activity tolerance, Decreased endurance, Decreased range of motion, Decreased strength, Impaired UE functional use, Impaired flexibility, Difficulty walking  Visit Diagnosis: Lymphedema, not elsewhere classified  Muscle weakness (generalized)  Chronic bilateral low back pain without sciatica     Problem List Patient Active Problem List   Diagnosis Date Noted  . Nausea 03/31/2020  . Multiple thyroid nodules 12/28/2019  . Thyroid nodule 09/29/2019  . Lymphedema 11/10/2018  . PAD (peripheral artery disease) ([place]) 10/12/2018  . Aortic atherosclerosis ([place]) 10/12/2018  . Carotid stenosis, asymptomatic, bilateral 10/12/2018  . Positive colorectal cancer screening using Cologuard test 10/09/2018  . Gastroesophageal reflux disease   . Acute gastritis without hemorrhage   . Osteoarthritis 04/29/2018  . Hiatal hernia with GERD 04/29/2018  . Hyperlipidemia 04/29/2018  . Lymphedema of both lower extremities 04/29/2018  . Left knee pain 10/23/2016  . Cataracts, bilateral 07/31/2016  . Glaucoma 07/31/2016  . Lateral epicondylitis of left elbow 10/20/2015  . Vitamin D deficiency 10/20/2015  . Type 2 diabetes, controlled, with neuropathy ([place]) 10/16/2015  . Gouty arthritis 06/03/2015  . H/O syncope 06/03/2015  . Mild intermittent asthma 06/03/2015  . Multiple gastric ulcers  06/03/2015  . Schatzki's ring 06/03/2015  . Undifferentiated connective tissue disease ([place]) 06/03/2015  . Bone disorder 04/05/2015  . Dental injury 04/05/2015  . Pericarditis 04/05/2015  . Sjogren's syndrome ([place]) 09/07/2014  . Elevated alkaline phosphatase level 08/24/2014  . Lactose intolerance 08/24/2014  . Obesity 08/24/2014  . Peripheral edema 08/24/2014  . 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 Abdominal adhesions 08/02/2014  . 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[place]) 08/02/2014  . Degenerative joint disease (DJD) of lumbar spine 08/02/2014  . Diverticulosis of both small and large intestine 08/02/2014  . Hyperuricemia 08/02/2014  . Pure hypercholesterolemia 08/02/2014  . Trigger finger 08/02/2014  . Right shoulder pain 07/29/2014  . Benign neoplasm of colon 06/30/2010  . Right upper quadrant pain 06/30/2010    Carlyle Basques, SPT 04/14/2020, 3:22 PM  [facility] [address] [place], [place], [address] Phone: [phone]   Fax:  [phone]  Name: Sonora Catlin MRN: [id number] Date of Birth: 04/02/1946

## 2020-04-15 ENCOUNTER — Telehealth: Payer: Self-pay

## 2020-04-15 NOTE — Chronic Care Management (AMB) (Signed)
  Care Management   Note  04/15/2020 Name: [name] MRN: [id number] DOB: [birth year]  [name] is a 74 y.o. year old female who is a primary care patient of [name], [name], [place] and is actively engaged with the care management team. I reached out to [name] by phone today to assist with re-scheduling a follow up visit with the Pharmacist  Follow up plan: Unsuccessful telephone outreach attempt made. A HIPAA compliant phone message was left for the patient providing contact information and requesting a return call.  The care management team will reach out to the patient again over the next 7 days.  If patient returns call to provider office, please advise to call [facility]  at [place], [place], [place], [place] [address] Direct Dial: [phone] [email or web address]

## 2020-04-18 ENCOUNTER — Other Ambulatory Visit: Payer: Self-pay

## 2020-04-18 ENCOUNTER — Ambulatory Visit: Payer: Medicare Other | Admitting: Occupational Therapy

## 2020-04-18 DIAGNOSIS — I890 Lymphedema, not elsewhere classified: Secondary | ICD-10-CM

## 2020-04-18 NOTE — Therapy (Addendum)
[place] MAIN [facility] SERVICES [address] [place], [place], [address] Phone: [phone]   Fax:  [phone]  Occupational Therapy Treatment  Patient Details  Name: [name] MRN: [id number] Date of Birth: 07/28/1945 Referring Provider (OT): Ida Rogue, MD   Encounter Date: 04/18/2020   OT End of Session - 04/18/20 1619    Visit Number 16   Number of Visits 36    Date for OT Re-Evaluation 05/08/20    OT Start Time 0220    OT Stop Time 0308    OT Time Calculation (min) 48 min    Activity Tolerance Patient tolerated treatment well;No increased pain    Behavior During Therapy WFL for tasks assessed/performed           Past Medical History:  Diagnosis Date  . Anterolisthesis    Cervical spine  . Asthma   . Connective tissue disorder (HCC)    recurrent carotid arteritis, temporal arteritis, vasculitis mandible, general hepatitis, avascular necrosis bilat  . DDD (degenerative disc disease), lumbar   . Diabetes mellitus without complication ([place])   . Diverticulosis   . Duodenitis   . Gouty arthritis   . Hiatal hernia with GERD   . History of cardiovascular stress test    a. 07/2018 [facility]): Fixed inferoapical defect w/ nl contraction-->attenuation artifact. No ischemia. EF 63%.  . Hyperlipidemia   . Hypertension    a. 02/2019 Renal artery duplex: no evidence of prox RAS.  Marland Kitchen Hyperuricemia   . Keratitis sicca, bilateral ([place])   . Lymphedema of both lower extremities   . Osteoarthritis   . Pericarditis    Recurrent: Aug 97, July 05, Sept 08, July 16, July 18  . PUD (peptic ulcer disease)   . 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[place])   . Sjogren's syndrome ([place])   . Syncope   . Tendonitis, Achilles, right   . Tortuous colon   . Trigger finger     Past Surgical History:  Procedure Laterality Date  . ABDOMINAL HYSTERECTOMY    . BREAST BIOPSY    . BREAST EXCISIONAL BIOPSY Left 1986   neg  . CHOLECYSTECTOMY    .  COLONOSCOPY WITH PROPOFOL N/A 08/26/2018   Procedure: COLONOSCOPY WITH PROPOFOL;  Surgeon: Lucilla Lame, MD;  Location: [facility] ENDOSCOPY;  Service: Endoscopy;  Laterality: N/A;  . CYSTOSCOPY  02/26/2018   [name], MD   . distal arthrectomy    . ESOPHAGOGASTRODUODENOSCOPY (EGD) WITH PROPOFOL N/A 08/26/2018   Procedure: ESOPHAGOGASTRODUODENOSCOPY (EGD) WITH PROPOFOL;  Surgeon: Lucilla Lame, MD;  Location[facility] ENDOSCOPY;  Service: Endoscopy;  Laterality: N/A;  . EXCISION NEUROMA    . KNEE SURGERY Bilateral    1985, 2001, 11/2002, 12/2006  . panhysterectomy  10/1983  . SHOULDER SURGERY Right    reverse total arthroplasty w/ biceps tenodesis  . TONSILLECTOMY AND ADENOIDECTOMY    . TOTAL HIP ARTHROPLASTY Right 04/2007  . WISDOM TOOTH EXTRACTION      There were no vitals filed for this visit.   Subjective Assessment - 04/18/20 1617    Subjective  [name] presents for OT Rx visit  16/36 to address BLE lymphedema. Pt presents without a cane today and limp is decreased significantly. Pt no longer wearing knee brace and reports she has decided to hold off on knee surgery.    Pertinent History PMHx extensive; Dx contributing to leg swelling include OA, B knee and R hip arthroplasties, asthma, HTN. Suspect LE venous insufficiency.    Limitations  chronic pain, chronic leg swelling, generalized weakness, kyphotic posture    Repetition Increases Symptoms    Special Tests + stemmer sign bilaterally base of toes    Patient Stated Goals Reduce LE edema    Pain Onset --   30 yrs                               OT Education - 04/18/20 1618    Education Details Continued skilled Pt/caregiver education  And LE ADL training throughout visit for lymphedema self care/ home program, including compression wrapping, compression garment and device wear/care, lymphatic pumping ther ex, simple self-MLD, and skin care. Discussed progress towards goals.    Person(s) Educated Patient     Methods Explanation;Demonstration;Handout    Comprehension Verbalized understanding;Returned demonstration               OT Long Term Goals - 03/28/20 1421      OT LONG TERM GOAL #1   Title With modified independent (extra time) Pt will be able to apply knee length, multi-layer, short stretch compression wraps daily from ankle to tibial tuberosity on one leg at a time using correct gradient techniques to return affected limb/s, as closely as possible, to premorbid size and shape, to limit leg pain and infection risk, and to improve safe functional mobility and ADLs performance.    Baseline Dependent    Time 4    Period Days    Status Achieved   achieved 4th visit     OT LONG TERM GOAL #2   Title Pt will be able to verbalize signs and symptoms of cellulitis infection and identify lymphedema precautions using printed resource (modified independence) for reference to decrease infection risk and limit LE progression over time.    Baseline Max A    Time 4    Period Days    Status Achieved      OT LONG TERM GOAL #3   Title Pt will sustain a least 85% compliance with all daily LE self-care home program components throughout Intensive Phase CDT, including impeccable skin care, lymphatic pumping ther ex,  compression wraps and simple self MLD, to ensure optimal limb volume reduction, to limit infection risk and to limit LE progression.    Baseline dependent    Time 12    Period Weeks    Status Achieved   "I'm 98% !"     OT LONG TERM GOAL #4   Title Using assistive devices (modified independence)  Pt will be able to don and doff appropriate daytime compression garments to limit edema re-accumulation and further progression by issue date.    Baseline Max A    Time 12    Period Weeks    Status On-going      OT LONG TERM GOAL #5   Title After skilled teaching Pt  will be able to perform all lymphedema self-care home program components with modified independence (extra time, assistive  devices PRN) , including simple self MLD, lymphatic pumping exercises, don, doff and wear compression garments  daily, sustain   impeccable skin care daily, to limit lymphedema progression and infection risk.    Baseline Max A    Time 12    Period Weeks    Status On-going   Pt is trained for comrpession wrapping skin care and ther ex. She is still working on learning simple self MLD as of 03/21/20     OT LONG TERM  GOAL #6   Title Pt will increase score on FOTO 10 points, from 45 to 55/100, to improve functional performance of basic ADLs.    Baseline Max A    Time 12    Period Weeks    Status On-going                 Plan - 04/18/20 1620    Clinical Impression Statement Pt tolerated MLD, skin care and gradient compression wrap to LLE today. RLE is swollen from toes to mid thigh after knee injury. Pt declines thigh length compression garments to limit risk of moving leg swelling into knee and not further up to inguinal LN  to limit increased joint tightness and possible inflammation. Will fit compression garments ASAP and reduce frequency. Cont as per POC    OT Occupational Profile and History Comprehensive Assessment- Review of records and extensive additional review of physical, cognitive, psychosocial history related to current functional performance           OT Occupational Profile and History Comprehensive Assessment- Review of records and extensive additional review of physical, cognitive, psychosocial history related to current functional performance    Occupational performance deficits (Please refer to evaluation for details): ADL's;IADL's;Social Participation;Work;Leisure;Other   body image   Body Structure / Function / Physical Skills ADL;Decreased knowledge of precautions;ROM;Balance;Decreased knowledge of use of DME;Mobility;Edema;Skin integrity;Pain;IADL    Rehab Potential Good    Clinical Decision Making Several treatment options, min-mod task modification necessary     Comorbidities Affecting Occupational Performance: Presence of comorbidities impacting occupational performance    Modification or Assistance to Complete Evaluation  Min-Moderate modification of tasks or assist with assess necessary to complete eval    OT Frequency 2x / week    OT Duration 12 weeks   and PRN   OT Treatment/Interventions Self-care/ADL training;Therapeutic exercise;Manual lymph drainage;Compression bandaging;Patient/family education;Other (comment);Therapeutic activities;DME and/or AE instruction;Manual Therapy    Plan Intensive Phase CDT: 1 leg at a time to limit fall risk. MLD, skin care, ther ex , cimpression wraps -> daytime garments, HOS device, consider Flexitouch if appropriate    Recommended Other Services Fit w/ appropriate compression garments that are effective, comfortable and  Pt can don using AE and extra time    Consulted and Agree with Plan of Care Patient              Patient will benefit from skilled therapeutic intervention in order to improve the following deficits and impairments:           Visit Diagnosis: Lymphedema, not elsewhere classified    Problem List Patient Active Problem List   Diagnosis Date Noted  . Nausea 03/31/2020  . Multiple thyroid nodules 12/28/2019  . Thyroid nodule 09/29/2019  . Lymphedema 11/10/2018  . PAD (peripheral artery disease) ([place]) 10/12/2018  . Aortic atherosclerosis ([place]) 10/12/2018  . Carotid stenosis, asymptomatic, bilateral 10/12/2018  . Positive colorectal cancer screening using Cologuard test 10/09/2018  . Gastroesophageal reflux disease   . Acute gastritis without hemorrhage   . Osteoarthritis 04/29/2018  . Hiatal hernia with GERD 04/29/2018  . Hyperlipidemia 04/29/2018  . Lymphedema of both lower extremities 04/29/2018  . Left knee pain 10/23/2016  . Cataracts, bilateral 07/31/2016  . Glaucoma 07/31/2016  . Lateral epicondylitis of left elbow 10/20/2015  . Vitamin D deficiency  10/20/2015  . Type 2 diabetes, controlled, with neuropathy ([place]) 10/16/2015  . Gouty arthritis 06/03/2015  . H/O syncope 06/03/2015  . Mild intermittent asthma 06/03/2015  . Multiple gastric  ulcers 06/03/2015  . Schatzki's ring 06/03/2015  . Undifferentiated connective tissue disease ([place]) 06/03/2015  . Bone disorder 04/05/2015  . Dental injury 04/05/2015  . Pericarditis 04/05/2015  . Sjogren's syndrome ([place]) 09/07/2014  . Elevated alkaline phosphatase level 08/24/2014  . Lactose intolerance 08/24/2014  . Obesity 08/24/2014  . Peripheral edema 08/24/2014  . 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 Abdominal adhesions 08/02/2014  . 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[place]) 08/02/2014  . Degenerative joint disease (DJD) of lumbar spine 08/02/2014  . Diverticulosis of both small and large intestine 08/02/2014  . Hyperuricemia 08/02/2014  . Pure hypercholesterolemia 08/02/2014  . Trigger finger 08/02/2014  . Right shoulder pain 07/29/2014  . Benign neoplasm of colon 06/30/2010  . Right upper quadrant pain 06/30/2010    Andrey Spearman, MS, OTR/L, [facility] 04/18/20 4:22 PM   [place] MAIN [facility] SERVICES [address] [place], [place], [address] Phone: [phone]   Fax:  [phone]  Name: [name] MRN: [id number] Date of Birth: [birth year]

## 2020-04-20 ENCOUNTER — Ambulatory Visit: Payer: Medicare Other | Admitting: Occupational Therapy

## 2020-04-20 DIAGNOSIS — I890 Lymphedema, not elsewhere classified: Secondary | ICD-10-CM | POA: Diagnosis not present

## 2020-04-20 NOTE — Therapy (Addendum)
[place] MAIN [facility] SERVICES [address] [place], [place], [address] Phone: [phone]   Fax:  [phone]  Occupational Therapy Treatment  Patient Details  Name: [name] MRN: [id number] Date of Birth: 1945-07-16 Referring Provider (OT): Patricia Rogue, MD   Encounter Date: 04/20/2020   OT End of Session - 04/20/20 1503    Visit Number 17   Number of Visits 36    Date for OT Re-Evaluation 05/08/20    OT Start Time 0207    Activity Tolerance Patient tolerated treatment well;No increased pain    Behavior During Therapy WFL for tasks assessed/performed           Past Medical History:  Diagnosis Date  . Anterolisthesis    Cervical spine  . Asthma   . Connective tissue disorder (HCC)    recurrent carotid arteritis, temporal arteritis, vasculitis mandible, general hepatitis, avascular necrosis bilat  . DDD (degenerative disc disease), lumbar   . Diabetes mellitus without complication ([place])   . Diverticulosis   . Duodenitis   . Gouty arthritis   . Hiatal hernia with GERD   . History of cardiovascular stress test    a. 07/2018 [facility]): Fixed inferoapical defect w/ nl contraction-->attenuation artifact. No ischemia. EF 63%.  . Hyperlipidemia   . Hypertension    a. 02/2019 Renal artery duplex: no evidence of prox RAS.  Marland Kitchen Hyperuricemia   . Keratitis sicca, bilateral ([place])   . Lymphedema of both lower extremities   . Osteoarthritis   . Pericarditis    Recurrent: Aug 97, July 05, Sept 08, July 16, July 18  . PUD (peptic ulcer disease)   . 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[place])   . Sjogren's syndrome ([place])   . Syncope   . Tendonitis, Achilles, right   . Tortuous colon   . Trigger finger     Past Surgical History:  Procedure Laterality Date  . ABDOMINAL HYSTERECTOMY    . BREAST BIOPSY    . BREAST EXCISIONAL BIOPSY Left 1986   neg  . CHOLECYSTECTOMY    . COLONOSCOPY WITH PROPOFOL N/A 08/26/2018   Procedure: COLONOSCOPY  WITH PROPOFOL;  Surgeon: Patricia Lame, MD;  Location: [facility] ENDOSCOPY;  Service: Endoscopy;  Laterality: N/A;  . CYSTOSCOPY  02/26/2018   Patricia Puls, MD   . distal arthrectomy    . ESOPHAGOGASTRODUODENOSCOPY (EGD) WITH PROPOFOL N/A 08/26/2018   Procedure: ESOPHAGOGASTRODUODENOSCOPY (EGD) WITH PROPOFOL;  Surgeon: Patricia Lame, MD;  Location: [facility] ENDOSCOPY;  Service: Endoscopy;  Laterality: N/A;  . EXCISION NEUROMA    . KNEE SURGERY Bilateral    1985, 2001, 11/2002, 12/2006  . panhysterectomy  10/1983  . SHOULDER SURGERY Right    reverse total arthroplasty w/ biceps tenodesis  . TONSILLECTOMY AND ADENOIDECTOMY    . TOTAL HIP ARTHROPLASTY Right 04/2007  . WISDOM TOOTH EXTRACTION      There were no vitals filed for this visit.   Subjective Assessment - 04/20/20 1410    Subjective  [name] presents for OT Rx visit  17/36 to address BLE lymphedema. Pt reports 0/10 LE-related leg pain  today. Pt reports foot wraps became lose soon after last visit.    Pertinent History PMHx extensive; Dx contributing to leg swelling include OA, B knee and R hip arthroplasties, asthma, HTN. Suspect LE venous insufficiency.    Limitations chronic pain, chronic leg swelling, generalized weakness, kyphotic posture    Repetition Increases Symptoms    Special Tests + stemmer sign bilaterally base of  toes    Patient Stated Goals Reduce LE edema    Pain Onset --   30 yrs                       OT Treatments/Exercises (OP) - 04/20/20 0001      ADLs   ADL Education Given Yes      Manual Therapy   Manual Therapy Edema management;Manual Lymphatic Drainage (MLD);Compression Bandaging    Manual Lymphatic Drainage (MLD) MLD to LLE as established    Compression Bandaging LLE short stretch knee length wraps as established                  OT Education - 04/20/20 1412    Education Details Continued skilled Pt/caregiver education  And LE ADL training throughout visit for lymphedema self  care/ home program, including compression wrapping, compression garment and device wear/care, lymphatic pumping ther ex, simple self-MLD, and skin care. Discussed progress towards goals.    Person(s) Educated Patient    Methods Explanation;Demonstration;Handout    Comprehension Verbalized understanding;Returned demonstration               OT Long Term Goals - 03/28/20 1421      OT LONG TERM GOAL #1   Title With modified independent (extra time) Pt will be able to apply knee length, multi-layer, short stretch compression wraps daily from ankle to tibial tuberosity on one leg at a time using correct gradient techniques to return affected limb/s, as closely as possible, to premorbid size and shape, to limit leg pain and infection risk, and to improve safe functional mobility and ADLs performance.    Baseline Dependent    Time 4    Period Days    Status Achieved   achieved 4th visit     OT LONG TERM GOAL #2   Title Pt will be able to verbalize signs and symptoms of cellulitis infection and identify lymphedema precautions using printed resource (modified independence) for reference to decrease infection risk and limit LE progression over time.    Baseline Max A    Time 4    Period Days    Status Achieved      OT LONG TERM GOAL #3   Title Pt will sustain a least 85% compliance with all daily LE self-care home program components throughout Intensive Phase CDT, including impeccable skin care, lymphatic pumping ther ex,  compression wraps and simple self MLD, to ensure optimal limb volume reduction, to limit infection risk and to limit LE progression.    Baseline dependent    Time 12    Period Weeks    Status Achieved   "I'm 98% !"     OT LONG TERM GOAL #4   Title Using assistive devices (modified independence)  Pt will be able to don and doff appropriate daytime compression garments to limit edema re-accumulation and further progression by issue date.    Baseline Max A    Time 12     Period Weeks    Status On-going      OT LONG TERM GOAL #5   Title After skilled teaching Pt  will be able to perform all lymphedema self-care home program components with modified independence (extra time, assistive devices PRN) , including simple self MLD, lymphatic pumping exercises, don, doff and wear compression garments  daily, sustain   impeccable skin care daily, to limit lymphedema progression and infection risk.    Baseline Max A    Time  12    Period Weeks    Status On-going   Pt is trained for comrpession wrapping skin care and ther ex. She is still working on learning simple self MLD as of 03/21/20     OT LONG TERM GOAL #6   Title Pt will increase score on FOTO 10 points, from 45 to 55/100, to improve functional performance of basic ADLs.    Baseline Max A    Time 12    Period Weeks    Status On-going                 Plan - 04/20/20 1504    Clinical Impression Statement LLE comparative limb volumetrics reveals 13% limb volume reduction since last measured. This value meets and exceeds limb volume reduction goal for the LLE., Pt continues to tolerate all aspects of CDT. LLE custom compression garment is ordered.  Cont as perPOC.    OT Occupational Profile and History Comprehensive Assessment- Review of records and extensive additional review of physical, cognitive, psychosocial history related to current functional performance           OT Occupational Profile and History Comprehensive Assessment- Review of records and extensive additional review of physical, cognitive, psychosocial history related to current functional performance    Occupational performance deficits (Please refer to evaluation for details): ADL's;IADL's;Social Participation;Work;Leisure;Other   body image   Body Structure / Function / Physical Skills ADL;Decreased knowledge of precautions;ROM;Balance;Decreased knowledge of use of DME;Mobility;Edema;Skin integrity;Pain;IADL    Rehab Potential Good     Clinical Decision Making Several treatment options, min-mod task modification necessary    Comorbidities Affecting Occupational Performance: Presence of comorbidities impacting occupational performance    Modification or Assistance to Complete Evaluation  Min-Moderate modification of tasks or assist with assess necessary to complete eval    OT Frequency 2x / week    OT Duration 12 weeks   and PRN   OT Treatment/Interventions Self-care/ADL training;Therapeutic exercise;Manual lymph drainage;Compression bandaging;Patient/family education;Other (comment);Therapeutic activities;DME and/or AE instruction;Manual Therapy    Plan Intensive Phase CDT: 1 leg at a time to limit fall risk. MLD, skin care, ther ex , cimpression wraps -> daytime garments, HOS device, consider Flexitouch if appropriate    Recommended Other Services Fit w/ appropriate compression garments that are effective, comfortable and  Pt can don using AE and extra time    Consulted and Agree with Plan of Care Patient             Patient will benefit from skilled therapeutic intervention in order to improve the following deficits and impairments:           Visit Diagnosis: Lymphedema, not elsewhere classified    Problem List Patient Active Problem List   Diagnosis Date Noted  . Nausea 03/31/2020  . Multiple thyroid nodules 12/28/2019  . Thyroid nodule 09/29/2019  . Lymphedema 11/10/2018  . PAD (peripheral artery disease) ([place]) 10/12/2018  . Aortic atherosclerosis ([place]) 10/12/2018  . Carotid stenosis, asymptomatic, bilateral 10/12/2018  . Positive colorectal cancer screening using Cologuard test 10/09/2018  . Gastroesophageal reflux disease   . Acute gastritis without hemorrhage   . Osteoarthritis 04/29/2018  . Hiatal hernia with GERD 04/29/2018  . Hyperlipidemia 04/29/2018  . Lymphedema of both lower extremities 04/29/2018  . Left knee pain 10/23/2016  . Cataracts, bilateral 07/31/2016  . Glaucoma  07/31/2016  . Lateral epicondylitis of left elbow 10/20/2015  . Vitamin D deficiency 10/20/2015  . Type 2 diabetes, controlled, with neuropathy ([place]) 10/16/2015  .  Gouty arthritis 06/03/2015  . H/O syncope 06/03/2015  . Mild intermittent asthma 06/03/2015  . Multiple gastric ulcers 06/03/2015  . Schatzki's ring 06/03/2015  . Undifferentiated connective tissue disease ([place]) 06/03/2015  . Bone disorder 04/05/2015  . Dental injury 04/05/2015  . Pericarditis 04/05/2015  . Sjogren's syndrome ([place]) 09/07/2014  . Elevated alkaline phosphatase level 08/24/2014  . Lactose intolerance 08/24/2014  . Obesity 08/24/2014  . Peripheral edema 08/24/2014  . 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 Abdominal adhesions 08/02/2014  . 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[place]) 08/02/2014  . Degenerative joint disease (DJD) of lumbar spine 08/02/2014  . Diverticulosis of both small and large intestine 08/02/2014  . Hyperuricemia 08/02/2014  . Pure hypercholesterolemia 08/02/2014  . Trigger finger 08/02/2014  . Right shoulder pain 07/29/2014  . Benign neoplasm of colon 06/30/2010  . Right upper quadrant pain 06/30/2010    Andrey Spearman, MS, OTR/L, [facility] 04/20/20 3:08 PM  [place] MAIN [facility] SERVICES [address] [place], [place], [address] Phone: [phone]   Fax:  [phone]  Name: [name] MRN: [id number] Date of Birth: 1945/08/13

## 2020-04-21 ENCOUNTER — Ambulatory Visit: Payer: Medicare Other | Admitting: Physical Therapy

## 2020-04-21 ENCOUNTER — Other Ambulatory Visit: Payer: Self-pay

## 2020-04-21 VITALS — BP 146/70

## 2020-04-21 DIAGNOSIS — G8929 Other chronic pain: Secondary | ICD-10-CM | POA: Diagnosis not present

## 2020-04-21 DIAGNOSIS — M6281 Muscle weakness (generalized): Secondary | ICD-10-CM | POA: Diagnosis not present

## 2020-04-21 DIAGNOSIS — M25661 Stiffness of right knee, not elsewhere classified: Secondary | ICD-10-CM | POA: Diagnosis not present

## 2020-04-21 DIAGNOSIS — M25561 Pain in right knee: Secondary | ICD-10-CM

## 2020-04-21 DIAGNOSIS — M545 Low back pain, unspecified: Secondary | ICD-10-CM | POA: Diagnosis not present

## 2020-04-21 NOTE — Therapy (Signed)
[facility] [address] [place], [place], [address] Phone: [phone]   Fax:  [phone]  Physical Therapy Treatment  Patient Details  Name: [name] MRN: [id number] Date of Birth: May 17, 1946 Referring Provider (PT): Dr. Harlow Mares   Encounter Date: 04/21/2020  Treatment: 22 of 79.  Recert date: 11/09/4429 5400 to 1602    Past Medical History:  Diagnosis Date  . Anterolisthesis    Cervical spine  . Asthma   . Connective tissue disorder (HCC)    recurrent carotid arteritis, temporal arteritis, vasculitis mandible, general hepatitis, avascular necrosis bilat  . DDD (degenerative disc disease), lumbar   . Diabetes mellitus without complication ([place])   . Diverticulosis   . Duodenitis   . Gouty arthritis   . Hiatal hernia with GERD   . History of cardiovascular stress test    a. 07/2018 MV [facility]): Fixed inferoapical defect w/ nl contraction-->attenuation artifact. No ischemia. EF 63%.  . Hyperlipidemia   . Hypertension    a. 02/2019 Renal artery duplex: no evidence of prox RAS.  Marland Kitchen Hyperuricemia   . Keratitis sicca, bilateral ([place])   . Lymphedema of both lower extremities   . Osteoarthritis   . Pericarditis    Recurrent: Aug 97, July 05, Sept 08, July 16, July 18  . PUD (peptic ulcer disease)   . 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[place])   . Sjogren's syndrome ([place])   . Syncope   . Tendonitis, Achilles, right   . Tortuous colon   . Trigger finger     Past Surgical History:  Procedure Laterality Date  . ABDOMINAL HYSTERECTOMY    . BREAST BIOPSY    . BREAST EXCISIONAL BIOPSY Left 1986   neg  . CHOLECYSTECTOMY    . COLONOSCOPY WITH PROPOFOL N/A 08/26/2018   Procedure: COLONOSCOPY WITH PROPOFOL;  Surgeon: Lucilla Lame, MD;  Location: [facility] ENDOSCOPY;  Service: Endoscopy;  Laterality: N/A;  . CYSTOSCOPY  02/26/2018   [name], MD   . distal arthrectomy    . ESOPHAGOGASTRODUODENOSCOPY (EGD) WITH PROPOFOL N/A 08/26/2018    Procedure: ESOPHAGOGASTRODUODENOSCOPY (EGD) WITH PROPOFOL;  Surgeon: Lucilla Lame, MD;  Location: [facility] ENDOSCOPY;  Service: Endoscopy;  Laterality: N/A;  . EXCISION NEUROMA    . KNEE SURGERY Bilateral    1985, 2001, 11/2002, 12/2006  . panhysterectomy  10/1983  . SHOULDER SURGERY Right    reverse total arthroplasty w/ biceps tenodesis  . TONSILLECTOMY AND ADENOIDECTOMY    . TOTAL HIP ARTHROPLASTY Right 04/2007  . WISDOM TOOTH EXTRACTION      Vitals:   04/21/20 1526  BP: (!) 146/70     Pt. states that she wants to continue discussing her knee with Dr. Clementeen Hoof today.     Knee Assessment:  CIRCUMFERENTIAL MEASUREMENT:  L/R  Joint line (43.5 cm/ 42.5 cm)    1 inch superior (44.5 cm/ 43 cm)  [facility] PT Assessment - 04/25/20 0001      Assessment   Medical Diagnosis Osteoarthritis multiple joints/ Chronic low back pain.    Referring Provider (PT) Dr. Harlow Mares    Onset Date/Surgical Date 06/12/19      Prior Function   Level of Independence Independent            1 inch inferior (38 cm/ 38 cm) RANGE OF MOTION: L/R knee extension: 0 deg./ 0 deg.        Flexion: 121 deg./ 105 deg.  STRENGTH: L/R (out of 5) Hip flex: 4+/4 Hip abd: 5/5 Hip add:  5/5 Knee flex: 5/4+* Knee ext: 5/5 * indicates pain  JOINT MOBILITY:  Patellar mobility: Lateral and superior mobs not painful, inferior and medial mobility limited and painful   Long axis distraction on R LE improved sx  PALPATION: Pt tender to palpation at medial aspect of knee and along joint line on R knee   TREATMENT:  Long sitting R quad sets: 10x with PT hand under knee. Verbal cueing to hold quad squeeze for approx 5-10 seconds  Long sitting R knee heel sides: 10x. Verbal cueing provided to flex knee as much as tolerated and to hold for 5 seconds.  Long sitting R SLR: 10x. Verbal and tactile cueing for completing full available range of SLR, as well as keeping R knee extended.          PT Long Term Goals - 04/21/20  1631      PT LONG TERM GOAL #1   Title Pt. will improve FOTO to predicted score to demonstrate improvement in pain free functional mobility.    Baseline 11/11: TBD    Time 8    Period Weeks    Status New    Target Date 06/16/20      PT LONG TERM GOAL #2   Title Pt. will improve R knee flexion AROM to at least 120 deg to improve stair climbing ability without compensations.    Baseline 11/11: R knee flex AROM: 105 deg    Time 8    Period Weeks    Status New    Target Date 06/16/20      PT LONG TERM GOAL #3   Title Pt. will improve R knee flexion strength to 5/5 without pain to improve pain free functional mobility.    Baseline 11/11: R knee flex: 4+/5 with pain    Time 8    Period Weeks    Status New    Target Date 06/16/20            Pt. arrived to PT today with new MD order for PT eval and tx. to R knee. PT assessed pt's R knee today and discussed options for therapy. Pt states a few weeks ago she stood up from a chair and felt excruciating pain in R knee, and was unable to walk without intense pain. Pt. went to an orthopedic urgent care, received MRI and found multiple deforimities including chondromalacia on medial and lateral patella, posterior horn and body tear of medial meniscus in the outer-third, partial lateral meniscetomy, chronic residual tear within posterior horn body and anterior horn, lateral compartment arthopathy, and cartilage ulcers on lateral femoral condyle and tibia. Pt. demonstrates slightly decreased hip flexion strength bilaterally: L: 4+/5, R: 4/5, and slightly decreased R knee flexion strength with pain (4+/5). All other hip motion and knee ext 5/5 bilat. Pt. demonstrates normal lateral and superior patellar mobility, limited inferior and medially. Pt. responds well to long axis distraction on R. Pt. tender to palpation on R knee joint line and along medial aspect of knee. Pt. states she is potentially interested in a full TKA of her R knee, but would like to  stay with therapy to complete prehab and see if therapy can potentially relieve sx enough to prevent/prolong need for surgery. Pt. will continue to benefit from skilled PT to improve overall function of R knee to improve independence with ADLs and improve pain free functional mobility.       Patient will benefit from skilled therapeutic intervention in order to improve the  following deficits and impairments:  Abnormal gait, Pain, Improper body mechanics, Postural dysfunction, Decreased mobility, Decreased activity tolerance, Decreased endurance, Decreased range of motion, Decreased strength, Impaired UE functional use, Impaired flexibility, Difficulty walking  Visit Diagnosis: Muscle weakness (generalized)  Chronic bilateral low back pain without sciatica  Right knee pain, unspecified chronicity  Decreased range of motion of right knee     Problem List Patient Active Problem List   Diagnosis Date Noted  . Nausea 03/31/2020  . Multiple thyroid nodules 12/28/2019  . Thyroid nodule 09/29/2019  . Lymphedema 11/10/2018  . PAD (peripheral artery disease) ([place]) 10/12/2018  . Aortic atherosclerosis ([place]) 10/12/2018  . Carotid stenosis, asymptomatic, bilateral 10/12/2018  . Positive colorectal cancer screening using Cologuard test 10/09/2018  . Gastroesophageal reflux disease   . Acute gastritis without hemorrhage   . Osteoarthritis 04/29/2018  . Hiatal hernia with GERD 04/29/2018  . Hyperlipidemia 04/29/2018  . Lymphedema of both lower extremities 04/29/2018  . Left knee pain 10/23/2016  . Cataracts, bilateral 07/31/2016  . Glaucoma 07/31/2016  . Lateral epicondylitis of left elbow 10/20/2015  . Vitamin D deficiency 10/20/2015  . Type 2 diabetes, controlled, with neuropathy ([place]) 10/16/2015  . Gouty arthritis 06/03/2015  . H/O syncope 06/03/2015  . Mild intermittent asthma 06/03/2015  . Multiple gastric ulcers 06/03/2015  . Schatzki's ring 06/03/2015  . Undifferentiated  connective tissue disease ([place]) 06/03/2015  . Bone disorder 04/05/2015  . Dental injury 04/05/2015  . Pericarditis 04/05/2015  . Sjogren's syndrome ([place]) 09/07/2014  . Elevated alkaline phosphatase level 08/24/2014  . Lactose intolerance 08/24/2014  . Obesity 08/24/2014  . Peripheral edema 08/24/2014  . 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 Abdominal adhesions 08/02/2014  . 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[place]) 08/02/2014  . Degenerative joint disease (DJD) of lumbar spine 08/02/2014  . Diverticulosis of both small and large intestine 08/02/2014  . Hyperuricemia 08/02/2014  . Pure hypercholesterolemia 08/02/2014  . Trigger finger 08/02/2014  . Right shoulder pain 07/29/2014  . Benign neoplasm of colon 06/30/2010  . Right upper quadrant pain 06/30/2010   Pura Spice, PT, DPT # [id number], SPT 04/25/2020, 2:52 PM  [facility] [address] [place], [place], [address] Phone: [phone]   Fax:  [phone]  Name: Aquita Simmering MRN: [id number] Date of Birth: May 22, 1946

## 2020-04-22 NOTE — Telephone Encounter (Signed)
Pt has been r/s  

## 2020-04-22 NOTE — Chronic Care Management (AMB) (Signed)
  Care Management   Note  04/22/2020 Name: Marchetta Navratil MRN: [id number] DOB: 19-Jul-1945  Jennings [employer] Timmons is a 74 y.o. year old female who is a primary care patient of Malfi, [name], FNP and is actively engaged with the care management team. I reached out to [name] by phone today to assist with re-scheduling a follow up visit with the Pharmacist  Follow up plan: Telephone appointment with care management team member scheduled for:05/23/2020  Noreene Larsson, [place], [place] Management  [place], [place] [address] Direct Dial: [phone] [email or web address] Website: [email or web address]

## 2020-04-25 ENCOUNTER — Ambulatory Visit: Payer: Medicare Other | Admitting: Occupational Therapy

## 2020-04-25 ENCOUNTER — Other Ambulatory Visit: Payer: Self-pay

## 2020-04-25 DIAGNOSIS — I890 Lymphedema, not elsewhere classified: Secondary | ICD-10-CM

## 2020-04-25 NOTE — Therapy (Addendum)
[place] MAIN [facility] SERVICES [address] [place], [place], [address] Phone: [phone]   Fax:  [phone]  Occupational Therapy Treatment Note and Progress Report Reporting period from 03/21/20 to 04/25/2020  Patient Details  Name: [name] MRN: [id number] Date of Birth: 07/29/1945 Referring Provider (OT): Ida Rogue, MD   Encounter Date: 04/25/2020   OT End of Session - 04/25/20 1417    Visit Number 18    Number of Visits 36    Date for OT Re-Evaluation 05/08/20    OT Start Time 0206    OT Stop Time 0300    OT Time Calculation (min) 54 min    Activity Tolerance Patient tolerated treatment well;No increased pain    Behavior During Therapy WFL for tasks assessed/performed           Past Medical History:  Diagnosis Date  . Anterolisthesis    Cervical spine  . Asthma   . Connective tissue disorder (HCC)    recurrent carotid arteritis, temporal arteritis, vasculitis mandible, general hepatitis, avascular necrosis bilat  . DDD (degenerative disc disease), lumbar   . Diabetes mellitus without complication ([place])   . Diverticulosis   . Duodenitis   . Gouty arthritis   . Hiatal hernia with GERD   . History of cardiovascular stress test    a. 07/2018 MV [facility]): Fixed inferoapical defect w/ nl contraction-->attenuation artifact. No ischemia. EF 63%.  . Hyperlipidemia   . Hypertension    a. 02/2019 Renal artery duplex: no evidence of prox RAS.  Marland Kitchen Hyperuricemia   . Keratitis sicca, bilateral ([place])   . Lymphedema of both lower extremities   . Osteoarthritis   . Pericarditis    Recurrent: Aug 97, July 05, Sept 08, July 16, July 18  . PUD (peptic ulcer disease)   . 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[place])   . Sjogren's syndrome ([place])   . Syncope   . Tendonitis, Achilles, right   . Tortuous colon   . Trigger finger     Past Surgical History:  Procedure Laterality Date  . ABDOMINAL HYSTERECTOMY    . BREAST BIOPSY    .  BREAST EXCISIONAL BIOPSY Left 1986   neg  . CHOLECYSTECTOMY    . COLONOSCOPY WITH PROPOFOL N/A 08/26/2018   Procedure: COLONOSCOPY WITH PROPOFOL;  Surgeon: Lucilla Lame, MD;  Location: [facility] ENDOSCOPY;  Service: Endoscopy;  Laterality: N/A;  . CYSTOSCOPY  02/26/2018   [name], MD   . distal arthrectomy    . ESOPHAGOGASTRODUODENOSCOPY (EGD) WITH PROPOFOL N/A 08/26/2018   Procedure: ESOPHAGOGASTRODUODENOSCOPY (EGD) WITH PROPOFOL;  Surgeon: Lucilla Lame, MD;  Location: [facility] ENDOSCOPY;  Service: Endoscopy;  Laterality: N/A;  . EXCISION NEUROMA    . KNEE SURGERY Bilateral    1985, 2001, 11/2002, 12/2006  . panhysterectomy  10/1983  . SHOULDER SURGERY Right    reverse total arthroplasty w/ biceps tenodesis  . TONSILLECTOMY AND ADENOIDECTOMY    . TOTAL HIP ARTHROPLASTY Right 04/2007  . WISDOM TOOTH EXTRACTION      There were no vitals filed for this visit.   Subjective Assessment - 04/25/20 1418    Subjective  [name] presents for OT Rx visit  18/36 to address BLE lymphedema. Pt reports 0/10 LE-related leg pain  today. Pt reports she commenced PT on her R knee  and it is more sore today.    Pertinent History PMHx extensive; Dx contributing to leg swelling include OA, B knee and R hip arthroplasties, asthma, HTN. Suspect  LE venous insufficiency.    Limitations chronic pain, chronic leg swelling, generalized weakness, kyphotic posture    Repetition Increases Symptoms    Special Tests + stemmer sign bilaterally base of toes    Patient Stated Goals Reduce LE edema    Pain Onset --   30 yrs                       OT Treatments/Exercises (OP) - 04/25/20 0001      ADLs   ADL Education Given Yes      Manual Therapy   Manual Therapy Edema management;Manual Lymphatic Drainage (MLD);Compression Bandaging    Manual Lymphatic Drainage (MLD) MLD to LLE as established    Compression Bandaging LLE short stretch knee length wraps as established                  OT  Education - 04/25/20 1420    Education Details Continued skilled Pt/caregiver education  And LE ADL training throughout visit for lymphedema self care/ home program, including compression wrapping, compression garment and device wear/care, lymphatic pumping ther ex, simple self-MLD, and skin care. Discussed progress towards goals.    Person(s) Educated Patient    Methods Explanation;Demonstration;Handout    Comprehension Verbalized understanding;Returned demonstration               OT Long Term Goals - 04/25/20     OT LONG TERM GOAL #1   Title With modified independent (extra time) Pt will be able to apply knee length, multi-layer, short stretch compression wraps daily from ankle to tibial tuberosity on one leg at a time using correct gradient techniques to return affected limb/s, as closely as possible, to premorbid size and shape, to limit leg pain and infection risk, and to improve safe functional mobility and ADLs performance.    Baseline Dependent    Time 4    Period Days    Status Achieved   achieved 4th visit     OT LONG TERM GOAL #2   Title Pt will be able to verbalize signs and symptoms of cellulitis infection and identify lymphedema precautions using printed resource (modified independence) for reference to decrease infection risk and limit LE progression over time.    Baseline Max A    Time 4    Period Days    Status Achieved      OT LONG TERM GOAL #3   Title Pt will sustain a least 85% compliance with all daily LE self-care home program components throughout Intensive Phase CDT, including impeccable skin care, lymphatic pumping ther ex,  compression wraps and simple self MLD, to ensure optimal limb volume reduction, to limit infection risk and to limit LE progression.    Baseline dependent    Time 12    Period Weeks    Status Achieved   "I'm 98% !"     OT LONG TERM GOAL #4   Title Using assistive devices (modified independence)  Pt will be able to don and doff  appropriate daytime compression garments to limit edema re-accumulation and further progression by issue date.    Baseline Max A    Time 12    Period Weeks    Status On-going      OT LONG TERM GOAL #5   Title After skilled teaching Pt  will be able to perform all lymphedema self-care home program components with modified independence (extra time, assistive devices PRN) , including simple self MLD, lymphatic pumping exercises, don,  doff and wear compression garments  daily, sustain   impeccable skin care daily, to limit lymphedema progression and infection risk.    Baseline Max A    Time 12    Period Weeks    Status On-going   Pt is trained for comrpession wrapping skin care and ther ex. She is still working on learning simple self MLD as of 03/21/20     OT LONG TERM GOAL #6   Title Pt will increase score on FOTO 10 points, from 45 to 55/100, to improve functional performance of basic ADLs.    Baseline Max A    Time 12    Period Weeks    Status On-going                 Plan - 04/25/20 1458    Clinical Impression Statement Pt continues to make steady progress towards all OT goals for CDT. She is able to apply compression wraps using correct techniques and is compliant with wear and care schedule. Limb volume reduction  is decreasing slowly, but steadily and tissue sensitivity is dramatically improved. Pt is greater that 85% compliant wi LE self care home program. She is proud to be 100% compliant by report. Pt has not yet achieved garment/ device related goals as she has not yet received these elements. Pt tolerated MLD, skin care and gradient compression wrap to LLE with no increased pain. Pt continues to wear knee brace on R knee. Fit L custom compression garment as soon as available and ommence cdt to RLE.     OT Occupational Profile and History Comprehensive Assessment- Review of records and extensive additional review of physical, cognitive, psychosocial history related to current  functional performance           OT Occupational Profile and History Comprehensive Assessment- Review of records and extensive additional review of physical, cognitive, psychosocial history related to current functional performance    Occupational performance deficits (Please refer to evaluation for details): ADL's;IADL's;Social Participation;Work;Leisure;Other   body image   Body Structure / Function / Physical Skills ADL;Decreased knowledge of precautions;ROM;Balance;Decreased knowledge of use of DME;Mobility;Edema;Skin integrity;Pain;IADL    Rehab Potential Good    Clinical Decision Making Several treatment options, min-mod task modification necessary    Comorbidities Affecting Occupational Performance: Presence of comorbidities impacting occupational performance    Modification or Assistance to Complete Evaluation  Min-Moderate modification of tasks or assist with assess necessary to complete eval    OT Frequency 2x / week    OT Duration 12 weeks   and PRN   OT Treatment/Interventions Self-care/ADL training;Therapeutic exercise;Manual lymph drainage;Compression bandaging;Patient/family education;Other (comment);Therapeutic activities;DME and/or AE instruction;Manual Therapy    Plan Intensive Phase CDT: 1 leg at a time to limit fall risk. MLD, skin care, ther ex , cimpression wraps -> daytime garments, HOS device, consider Flexitouch if appropriate    Recommended Other Services Fit w/ appropriate compression garments that are effective, comfortable and  Pt can don using AE and extra time    Consulted and Agree with Plan of Care Patient             Patient will benefit from skilled therapeutic intervention in order to improve the following deficits and impairments:           Visit Diagnosis: Lymphedema, not elsewhere classified    Problem List Patient Active Problem List   Diagnosis Date Noted  . Nausea 03/31/2020  . Multiple thyroid nodules 12/28/2019  .  Thyroid nodule 09/29/2019  .  Lymphedema 11/10/2018  . PAD (peripheral artery disease) ([place]) 10/12/2018  . Aortic atherosclerosis ([place]) 10/12/2018  . Carotid stenosis, asymptomatic, bilateral 10/12/2018  . Positive colorectal cancer screening using Cologuard test 10/09/2018  . Gastroesophageal reflux disease   . Acute gastritis without hemorrhage   . Osteoarthritis 04/29/2018  . Hiatal hernia with GERD 04/29/2018  . Hyperlipidemia 04/29/2018  . Lymphedema of both lower extremities 04/29/2018  . Left knee pain 10/23/2016  . Cataracts, bilateral 07/31/2016  . Glaucoma 07/31/2016  . Lateral epicondylitis of left elbow 10/20/2015  . Vitamin D deficiency 10/20/2015  . Type 2 diabetes, controlled, with neuropathy ([place]) 10/16/2015  . Gouty arthritis 06/03/2015  . H/O syncope 06/03/2015  . Mild intermittent asthma 06/03/2015  . Multiple gastric ulcers 06/03/2015  . Schatzki's ring 06/03/2015  . Undifferentiated connective tissue disease ([employer]) 06/03/2015  . Bone disorder 04/05/2015  . Dental injury 04/05/2015  . Pericarditis 04/05/2015  . Sjogren's syndrome ([place]) 09/07/2014  . Elevated alkaline phosphatase level 08/24/2014  . Lactose intolerance 08/24/2014  . Obesity 08/24/2014  . Peripheral edema 08/24/2014  . 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 Abdominal adhesions 08/02/2014  . 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[place]) 08/02/2014  . Degenerative joint disease (DJD) of lumbar spine 08/02/2014  . Diverticulosis of both small and large intestine 08/02/2014  . Hyperuricemia 08/02/2014  . Pure hypercholesterolemia 08/02/2014  . Trigger finger 08/02/2014  . Right shoulder pain 07/29/2014  . Benign neoplasm of colon 06/30/2010  . Right upper quadrant pain 06/30/2010    Andrey Spearman, MS, OTR/L, [facility] 04/25/20 3:00 PM  [place] MAIN [facility] SERVICES [address] [place], [place], [address] Phone:  [phone]   Fax:  [phone]  Name: [name] MRN: [id number] Date of Birth: [birth year]

## 2020-04-26 ENCOUNTER — Other Ambulatory Visit: Payer: Self-pay

## 2020-04-26 ENCOUNTER — Ambulatory Visit: Payer: Medicare Other | Admitting: Physical Therapy

## 2020-04-26 ENCOUNTER — Encounter: Payer: Self-pay | Admitting: Physical Therapy

## 2020-04-26 VITALS — BP 138/58

## 2020-04-26 DIAGNOSIS — M6281 Muscle weakness (generalized): Secondary | ICD-10-CM | POA: Diagnosis not present

## 2020-04-26 DIAGNOSIS — M25661 Stiffness of right knee, not elsewhere classified: Secondary | ICD-10-CM | POA: Diagnosis not present

## 2020-04-26 DIAGNOSIS — M25561 Pain in right knee: Secondary | ICD-10-CM

## 2020-04-26 DIAGNOSIS — M545 Low back pain, unspecified: Secondary | ICD-10-CM | POA: Diagnosis not present

## 2020-04-26 DIAGNOSIS — G8929 Other chronic pain: Secondary | ICD-10-CM | POA: Diagnosis not present

## 2020-04-26 NOTE — Therapy (Addendum)
[facility] [address]. [place], [place], [address] Phone: [phone]   Fax:  [phone]  Physical Therapy Treatment Physical Therapy Progress Note   Dates of reporting period  12/31/19  to   04/26/20  Patient Details  Name: [name] MRN: [id number] Date of Birth: 1945-12-22 Referring Provider (PT): Dr. Harlow Mares   Encounter Date: 04/26/2020     PT End of Session - 04/26/20 1357    Visit Number 23    Number of Visits 38    Date for PT Re-Evaluation 06/16/20    Authorization - Visit Number 1    Authorization - Number of Visits 10    PT Start Time 6153    PT Stop Time 1430    PT Time Calculation (min) 38 min    Activity Tolerance Patient tolerated treatment well;Patient limited by pain    Behavior During Therapy [facility] for tasks assessed/performed           Past Medical History:  Diagnosis Date  . Anterolisthesis    Cervical spine  . Asthma   . Connective tissue disorder (HCC)    recurrent carotid arteritis, temporal arteritis, vasculitis mandible, general hepatitis, avascular necrosis bilat  . DDD (degenerative disc disease), lumbar   . Diabetes mellitus without complication ([place])   . Diverticulosis   . Duodenitis   . Gouty arthritis   . Hiatal hernia with GERD   . History of cardiovascular stress test    a. 07/2018 [facility]): Fixed inferoapical defect w/ nl contraction-->attenuation artifact. No ischemia. EF 63%.  . Hyperlipidemia   . Hypertension    a. 02/2019 Renal artery duplex: no evidence of prox RAS.  Marland Kitchen Hyperuricemia   . Keratitis sicca, bilateral ([place])   . Lymphedema of both lower extremities   . Osteoarthritis   . Pericarditis    Recurrent: Aug 97, July 05, Sept 08, July 16, July 18  . PUD (peptic ulcer disease)   . 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[place])   . Sjogren's syndrome ([place])   . Syncope   . Tendonitis, Achilles, right   . Tortuous colon   . Trigger finger     Past Surgical History:   Procedure Laterality Date  . ABDOMINAL HYSTERECTOMY    . BREAST BIOPSY    . BREAST EXCISIONAL BIOPSY Left 1986   neg  . CHOLECYSTECTOMY    . COLONOSCOPY WITH PROPOFOL N/A 08/26/2018   Procedure: COLONOSCOPY WITH PROPOFOL;  Surgeon: Lucilla Lame, MD;  Location: [facility] ENDOSCOPY;  Service: Endoscopy;  Laterality: N/A;  . CYSTOSCOPY  02/26/2018   [name], MD   . distal arthrectomy    . ESOPHAGOGASTRODUODENOSCOPY (EGD) WITH PROPOFOL N/A 08/26/2018   Procedure: ESOPHAGOGASTRODUODENOSCOPY (EGD) WITH PROPOFOL;  Surgeon: Lucilla Lame, MD;  Location: [facility] ENDOSCOPY;  Service: Endoscopy;  Laterality: N/A;  . EXCISION NEUROMA    . KNEE SURGERY Bilateral    1985, 2001, 11/2002, 12/2006  . panhysterectomy  10/1983  . SHOULDER SURGERY Right    reverse total arthroplasty w/ biceps tenodesis  . TONSILLECTOMY AND ADENOIDECTOMY    . TOTAL HIP ARTHROPLASTY Right 04/2007  . WISDOM TOOTH EXTRACTION      Vitals:   04/26/20 1358  BP: (!) 138/58     Subjective Assessment - 04/26/20 1354    Subjective Pt. states her knee is aggravated yesterday and today. Pt. states R knee is 4/10. She states that she had to start using the knee brace yesterday.    Pertinent History Pt.  from [place] and came to [place] to take care of friend and then Covid happened.  Pt. retired.  Pts. ex-husband lives in her house in [place].  R shoulder limitations (5# lifting restrictions).  Pt. went to [employer] for 10 years (every week) with benefits reported in low back.    Limitations Standing;Walking;Writing    Patient Stated Goals Decrease back pain/ improve strength/ mobility.    Currently in Pain? Yes    Pain Score 4     Pain Orientation Right    Pain Descriptors / Indicators Aching    Pain Onset --   30 yrs          See updated goals    There Ex:   Long arc quad R LE with 3lb weight: 2x15, instructed to extend to pain tolerance   Mini squats in front of // bars: 20x, verbal and tactile cueing for proper form and maintaining  proper hip hinge. Pt able to intermittently demonstrate good form without cueing. Pt felt slight discomfort in knee, instructed to complete to tolerance.   Step up onto 3in plinth with R LE: 10x, cueing for knee positioning and hip hinging  Manual:   Supine AP and PA mobs R tibia: grade 2-3 15s each direction 5x each way.         PT Long Term Goals - 04/26/2020 1912      PT LONG TERM GOAL #1   Title Pt. will improve FOTO to predicted score to demonstrate improvement in pain free functional mobility.    Baseline 11/11: TBD    Time 4    Period Weeks    Status New    Target Date 07/05/20      PT LONG TERM GOAL #2   Title Pt. will improve R knee flexion AROM to at least 120 deg to improve stair climbing ability without compensations.    Baseline 11/11: R knee flex AROM: 105 deg    Time 4    Period Weeks    Status On-going    Target Date 07/05/20      PT LONG TERM GOAL #3   Title Pt. will improve R knee flexion strength to 5/5 without pain to improve pain free functional mobility.    Baseline 11/11: R knee flex: 4+/5 with pain    Time 4    Period Weeks    Status Not Met    Target Date 07/05/20      PT LONG TERM GOAL #4   Title Pt. will report 4/10 low back pain at worst with no radicular symptoms to improve ADLs/ household chores.    Baseline Pt. reports no pain in back currently at rest but >6/10 pain with increase activity.; 10/7: Pain is > 5/10 NPS with household chores.    Time 4    Period Weeks    Status Partially Met    Target Date 07/05/20               Plan - 04/26/20 1431    Clinical Impression Statement Pt. returns to therapy with slightly increased R knee pain. Pt. able to complete R quad strengthening activities including R LAQs with 3lb weight, step ups, and mini squats. Pt. able to maintain proper form with cueing for knee positioning and increasing hip hinge. Pt. educated on proper timeline for potential knee replacement. Pt. will continue to benefit  from skilled PT to improve R knee pain and strength to improve independence with ADLs and improve pain free functional  mobility.    Personal Factors and Comorbidities Education;Comorbidity 3+    Comorbidities AAA, AVN, HTN, severe LE edema bilat (L>R)    Examination-Activity Limitations Locomotion Level;Squat;Stairs;Stand    Examination-Participation Restrictions Yard Work;Driving;Community Activity    Stability/Clinical Decision Making Evolving/Moderate complexity    Clinical Decision Making Moderate    Rehab Potential Fair    PT Frequency 2x / week    PT Duration 8 weeks    PT Treatment/Interventions ADLs/Self Care Home Management;Electrical Stimulation;Moist Heat;Gait training;Stair training;Functional mobility training;Neuromuscular re-education;Balance training;Therapeutic exercise;Therapeutic activities;Patient/family education;Manual techniques;Passive range of motion    PT Next Visit Plan FOTO for knee, knee A/P mobs    Consulted and Agree with Plan of Care Patient           Patient will benefit from skilled therapeutic intervention in order to improve the following deficits and impairments:  Abnormal gait,Pain,Improper body mechanics,Postural dysfunction,Decreased mobility,Decreased activity tolerance,Decreased endurance,Decreased range of motion,Decreased strength,Impaired UE functional use,Impaired flexibility,Difficulty walking  Visit Diagnosis: Muscle weakness (generalized)  Right knee pain, unspecified chronicity  Decreased range of motion of right knee     Problem List Patient Active Problem List   Diagnosis Date Noted  . Nausea 03/31/2020  . Multiple thyroid nodules 12/28/2019  . Thyroid nodule 09/29/2019  . Lymphedema 11/10/2018  . PAD (peripheral artery disease) ([place]) 10/12/2018  . Aortic atherosclerosis ([place]) 10/12/2018  . Carotid stenosis, asymptomatic, bilateral 10/12/2018  . Positive colorectal cancer screening using Cologuard test 10/09/2018  .  Gastroesophageal reflux disease   . Acute gastritis without hemorrhage   . Osteoarthritis 04/29/2018  . Hiatal hernia with GERD 04/29/2018  . Hyperlipidemia 04/29/2018  . Lymphedema of both lower extremities 04/29/2018  . Left knee pain 10/23/2016  . Cataracts, bilateral 07/31/2016  . Glaucoma 07/31/2016  . Lateral epicondylitis of left elbow 10/20/2015  . Vitamin D deficiency 10/20/2015  . Type 2 diabetes, controlled, with neuropathy ([place]) 10/16/2015  . Gouty arthritis 06/03/2015  . H/O syncope 06/03/2015  . Mild intermittent asthma 06/03/2015  . Multiple gastric ulcers 06/03/2015  . Schatzki's ring 06/03/2015  . Undifferentiated connective tissue disease ([place]) 06/03/2015  . Bone disorder 04/05/2015  . Dental injury 04/05/2015  . Pericarditis 04/05/2015  . Sjogren's syndrome ([place]) 09/07/2014  . Elevated alkaline phosphatase level 08/24/2014  . Lactose intolerance 08/24/2014  . Obesity 08/24/2014  . Peripheral edema 08/24/2014  . 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 Abdominal adhesions 08/02/2014  . 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[place]) 08/02/2014  . Degenerative joint disease (DJD) of lumbar spine 08/02/2014  . Diverticulosis of both small and large intestine 08/02/2014  . Hyperuricemia 08/02/2014  . Pure hypercholesterolemia 08/02/2014  . Trigger finger 08/02/2014  . Right shoulder pain 07/29/2014  . Benign neoplasm of colon 06/30/2010  . Right upper quadrant pain 06/30/2010   Pura Spice, PT, DPT # [id number] HENIDPO [id number], SPT 04/27/2020, 10:33 AM  [place] [facility] [address] [place], [place], [address] Phone: [phone]   Fax:  [phone]  Name: Villa Burgin MRN: [id number] Date of Birth: [birth year]

## 2020-04-27 ENCOUNTER — Ambulatory Visit: Payer: Medicare Other | Admitting: Occupational Therapy

## 2020-04-27 DIAGNOSIS — I890 Lymphedema, not elsewhere classified: Secondary | ICD-10-CM | POA: Diagnosis not present

## 2020-04-27 NOTE — Therapy (Addendum)
[place] [facility] SERVICES [address] [place], [place], [address] Phone: [phone]   Fax:  [phone]  Occupational Therapy Treatment  Patient Details  Name: [name] MRN: [id number] Date of Birth: [birth year] Referring Provider (OT): Ida Rogue, MD   Encounter Date: 04/27/2020   OT End of Session - 04/27/20 1515    Visit Number 19   Number of Visits 36    Date for OT Re-Evaluation 05/08/20    OT Start Time 0205    OT Stop Time 0255    OT Time Calculation (min) 50 min    Activity Tolerance Patient tolerated treatment well;No increased pain    Behavior During Therapy WFL for tasks assessed/performed           Past Medical History:  Diagnosis Date  . Anterolisthesis    Cervical spine  . Asthma   . Connective tissue disorder (HCC)    recurrent carotid arteritis, temporal arteritis, vasculitis mandible, general hepatitis, avascular necrosis bilat  . DDD (degenerative disc disease), lumbar   . Diabetes mellitus without complication ([place])   . Diverticulosis   . Duodenitis   . Gouty arthritis   . Hiatal hernia with GERD   . History of cardiovascular stress test    a. 07/2018 [facility]): Fixed inferoapical defect w/ nl contraction-->attenuation artifact. No ischemia. EF 63%.  . Hyperlipidemia   . Hypertension    a. 02/2019 Renal artery duplex: no evidence of prox RAS.  Marland Kitchen Hyperuricemia   . Keratitis sicca, bilateral ([place])   . Lymphedema of both lower extremities   . Osteoarthritis   . Pericarditis    Recurrent: Aug 97, July 05, Sept 08, July 16, July 18  . PUD (peptic ulcer disease)   . 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[place])   . Sjogren's syndrome ([place])   . Syncope   . Tendonitis, Achilles, right   . Tortuous colon   . Trigger finger     Past Surgical History:  Procedure Laterality Date  . ABDOMINAL HYSTERECTOMY    . BREAST BIOPSY    . BREAST EXCISIONAL BIOPSY Left 1986   neg  . CHOLECYSTECTOMY    .  COLONOSCOPY WITH PROPOFOL N/A 08/26/2018   Procedure: COLONOSCOPY WITH PROPOFOL;  Surgeon: Lucilla Lame, MD;  Location: [facility] ENDOSCOPY;  Service: Endoscopy;  Laterality: N/A;  . CYSTOSCOPY  02/26/2018   [name], MD   . distal arthrectomy    . ESOPHAGOGASTRODUODENOSCOPY (EGD) WITH PROPOFOL N/A 08/26/2018   Procedure: ESOPHAGOGASTRODUODENOSCOPY (EGD) WITH PROPOFOL;  Surgeon: Lucilla Lame, MD;  Location: [facility] ENDOSCOPY;  Service: Endoscopy;  Laterality: N/A;  . EXCISION NEUROMA    . KNEE SURGERY Bilateral    1985, 2001, 11/2002, 12/2006  . panhysterectomy  10/1983  . SHOULDER SURGERY Right    reverse total arthroplasty w/ biceps tenodesis  . TONSILLECTOMY AND ADENOIDECTOMY    . TOTAL HIP ARTHROPLASTY Right 04/2007  . WISDOM TOOTH EXTRACTION      There were no vitals filed for this visit.   Subjective Assessment - 04/27/20 1419    Subjective  [name] presents for OT Rx visit  19/36 to address BLE lymphedema. Pt reports 1/10 LE-related leg pain  today. Pt's L custom knee length compression stocking arrives today for fitting.    Pertinent History PMHx extensive; Dx contributing to leg swelling include OA, B knee and R hip arthroplasties, asthma, HTN. Suspect LE venous insufficiency.    Limitations chronic pain, chronic leg swelling, generalized weakness, kyphotic posture  Repetition Increases Symptoms    Special Tests + stemmer sign bilaterally base of toes    Patient Stated Goals Reduce LE edema    Pain Onset --   30 yrs                       OT Treatments/Exercises (OP) - 04/27/20 0001      ADLs   ADL Education Given Yes      Manual Therapy   Manual Therapy Edema management    Edema Management fit LLE ciustom, ccl 2 Elvarex knee high                  OT Education - 04/27/20 1458    Education Details Pt edu for donning / doffing, wear and care of custom LLE compression garment using assistive devices, including tyvek slipper ad friction cloves. Pt  declined practiciing donning/dogffing in clinic. Pt given resources for obtaining gloves, and templte drawn for making tyvek slipper from mailing envelopes.    Person(s) Educated Patient    Methods Explanation;Demonstration;Handout    Comprehension Verbalized understanding;Returned demonstration               OT Long Term Goals - 03/28/20 1421      OT LONG TERM GOAL #1   Title With modified independent (extra time) Pt will be able to apply knee length, multi-layer, short stretch compression wraps daily from ankle to tibial tuberosity on one leg at a time using correct gradient techniques to return affected limb/s, as closely as possible, to premorbid size and shape, to limit leg pain and infection risk, and to improve safe functional mobility and ADLs performance.    Baseline Dependent    Time 4    Period Days    Status Achieved   achieved 4th visit     OT LONG TERM GOAL #2   Title Pt will be able to verbalize signs and symptoms of cellulitis infection and identify lymphedema precautions using printed resource (modified independence) for reference to decrease infection risk and limit LE progression over time.    Baseline Max A    Time 4    Period Days    Status Achieved      OT LONG TERM GOAL #3   Title Pt will sustain a least 85% compliance with all daily LE self-care home program components throughout Intensive Phase CDT, including impeccable skin care, lymphatic pumping ther ex,  compression wraps and simple self MLD, to ensure optimal limb volume reduction, to limit infection risk and to limit LE progression.    Baseline dependent    Time 12    Period Weeks    Status Achieved   "I'm 98% !"     OT LONG TERM GOAL #4   Title Using assistive devices (modified independence)  Pt will be able to don and doff appropriate daytime compression garments to limit edema re-accumulation and further progression by issue date.    Baseline Max A    Time 12    Period Weeks    Status On-going       OT LONG TERM GOAL #5   Title After skilled teaching Pt  will be able to perform all lymphedema self-care home program components with modified independence (extra time, assistive devices PRN) , including simple self MLD, lymphatic pumping exercises, don, doff and wear compression garments  daily, sustain   impeccable skin care daily, to limit lymphedema progression and infection risk.    Baseline Max  A    Time 12    Period Weeks    Status On-going   Pt is trained for comrpession wrapping skin care and ther ex. She is still working on learning simple self MLD as of 03/21/20     OT LONG TERM GOAL #6   Title Pt will increase score on FOTO 10 points, from 45 to 55/100, to improve functional performance of basic ADLs.    Baseline Max A    Time 12    Period Weeks    Status On-going                 Plan - 04/27/20 1516    Clinical Impression Statement LLE custom , ccl 2, Elvarex flat knit, (open toe, oblique top edge, 2.5 cm SB) appears to fit and function well upon initial assessment. Pt will wash and wear and complete functional assessment over the weekend. We'll complete assessment for containment and comfort next session, and commence RLE CDT.    OT Occupational Profile and History Comprehensive Assessment- Review of records and extensive additional review of physical, cognitive, psychosocial history related to current functional performance           OT Occupational Profile and History Comprehensive Assessment- Review of records and extensive additional review of physical, cognitive, psychosocial history related to current functional performance     Occupational performance deficits (Please refer to evaluation for details): ADL's;IADL's;Social Participation;Work;Leisure;Other   body image    Body Structure / Function / Physical Skills ADL;Decreased knowledge of precautions;ROM;Balance;Decreased knowledge of use of DME;Mobility;Edema;Skin integrity;Pain;IADL     Rehab Potential  Good     Clinical Decision Making Several treatment options, min-mod task modification necessary     Comorbidities Affecting Occupational Performance: Presence of comorbidities impacting occupational performance     Modification or Assistance to Complete Evaluation  Min-Moderate modification of tasks or assist with assess necessary to complete eval     OT Frequency 2x / week     OT Duration 12 weeks   and PRN    OT Treatment/Interventions Self-care/ADL training;Therapeutic exercise;Manual lymph drainage;Compression bandaging;Patient/family education;Other (comment);Therapeutic activities;DME and/or AE instruction;Manual Therapy     Plan Intensive Phase CDT: 1 leg at a time to limit fall risk. MLD, skin care, ther ex , cimpression wraps -> daytime garments, HOS device, consider Flexitouch if appropriate     Recommended Other Services Fit w/ appropriate compression garments that are effective, comfortable and  Pt can don using AE and extra time     Consulted and Agree with Plan of Care Patient                     Patient will benefit from skilled therapeutic intervention in order to improve the following deficits and impairments:           Visit Diagnosis: Lymphedema, not elsewhere classified    Problem List Patient Active Problem List   Diagnosis Date Noted  . Nausea 03/31/2020  . Multiple thyroid nodules 12/28/2019  . Thyroid nodule 09/29/2019  . Lymphedema 11/10/2018  . PAD (peripheral artery disease) ([place]) 10/12/2018  . Aortic atherosclerosis ([place]) 10/12/2018  . Carotid stenosis, asymptomatic, bilateral 10/12/2018  . Positive colorectal cancer screening using Cologuard test 10/09/2018  . Gastroesophageal reflux disease   . Acute gastritis without hemorrhage   . Osteoarthritis 04/29/2018  . Hiatal hernia with GERD 04/29/2018  . Hyperlipidemia 04/29/2018  . Lymphedema of both lower extremities 04/29/2018  . Left knee pain 10/23/2016  . Cataracts,  bilateral 07/31/2016   . Glaucoma 07/31/2016  . Lateral epicondylitis of left elbow 10/20/2015  . Vitamin D deficiency 10/20/2015  . Type 2 diabetes, controlled, with neuropathy ([place]) 10/16/2015  . Gouty arthritis 06/03/2015  . H/O syncope 06/03/2015  . Mild intermittent asthma 06/03/2015  . Multiple gastric ulcers 06/03/2015  . Schatzki's ring 06/03/2015  . Undifferentiated connective tissue disease ([place]) 06/03/2015  . Bone disorder 04/05/2015  . Dental injury 04/05/2015  . Pericarditis 04/05/2015  . Sjogren's syndrome ([place]) 09/07/2014  . Elevated alkaline phosphatase level 08/24/2014  . Lactose intolerance 08/24/2014  . Obesity 08/24/2014  . Peripheral edema 08/24/2014  . 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 Abdominal adhesions 08/02/2014  . 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[place]) 08/02/2014  . Degenerative joint disease (DJD) of lumbar spine 08/02/2014  . Diverticulosis of both small and large intestine 08/02/2014  . Hyperuricemia 08/02/2014  . Pure hypercholesterolemia 08/02/2014  . Trigger finger 08/02/2014  . Right shoulder pain 07/29/2014  . Benign neoplasm of colon 06/30/2010  . Right upper quadrant pain 06/30/2010    Andrey Spearman, MS, OTR/L, [facility] 04/27/20 3:18 PM  [place] MAIN [facility] SERVICES [address] [place], [place], [address] Phone: [phone]   Fax:  [phone]  Name: [name] MRN: [id number] Date of Birth: [birth year]

## 2020-04-28 ENCOUNTER — Encounter: Payer: Self-pay | Admitting: Physical Therapy

## 2020-04-28 ENCOUNTER — Ambulatory Visit: Payer: Medicare Other | Admitting: Physical Therapy

## 2020-04-28 ENCOUNTER — Other Ambulatory Visit: Payer: Self-pay

## 2020-04-28 VITALS — BP 135/61

## 2020-04-28 DIAGNOSIS — M25661 Stiffness of right knee, not elsewhere classified: Secondary | ICD-10-CM

## 2020-04-28 DIAGNOSIS — M25561 Pain in right knee: Secondary | ICD-10-CM

## 2020-04-28 DIAGNOSIS — M6281 Muscle weakness (generalized): Secondary | ICD-10-CM | POA: Diagnosis not present

## 2020-04-28 DIAGNOSIS — M545 Low back pain, unspecified: Secondary | ICD-10-CM | POA: Diagnosis not present

## 2020-04-28 DIAGNOSIS — G8929 Other chronic pain: Secondary | ICD-10-CM | POA: Diagnosis not present

## 2020-04-28 NOTE — Therapy (Signed)
[facility] [address] [place], [place], [address] Phone: [phone]   Fax:  [phone]  Physical Therapy Treatment  Patient Details  Name: Danice Dippolito MRN: [id number] Date of Birth: 02/04/1946 Referring Provider (PT): Dr. Harlow Mares   Encounter Date: 04/28/2020   PT End of Session - 04/28/20 1523    Visit Number 24    Number of Visits 38    Date for PT Re-Evaluation 06/16/20    Authorization - Visit Number 2    Authorization - Number of Visits 10    PT Start Time 6270    PT Stop Time 1601    PT Time Calculation (min) 46 min    Activity Tolerance Patient tolerated treatment well;Patient limited by pain    Behavior During Therapy [facility] for tasks assessed/performed           Past Medical History:  Diagnosis Date  . Anterolisthesis    Cervical spine  . Asthma   . Connective tissue disorder (HCC)    recurrent carotid arteritis, temporal arteritis, vasculitis mandible, general hepatitis, avascular necrosis bilat  . DDD (degenerative disc disease), lumbar   . Diabetes mellitus without complication ([place])   . Diverticulosis   . Duodenitis   . Gouty arthritis   . Hiatal hernia with GERD   . History of cardiovascular stress test    a. 07/2018 [facility]): Fixed inferoapical defect w/ nl contraction-->attenuation artifact. No ischemia. EF 63%.  . Hyperlipidemia   . Hypertension    a. 02/2019 Renal artery duplex: no evidence of prox RAS.  Marland Kitchen Hyperuricemia   . Keratitis sicca, bilateral ([place])   . Lymphedema of both lower extremities   . Osteoarthritis   . Pericarditis    Recurrent: Aug 97, July 05, Sept 08, July 16, July 18  . PUD (peptic ulcer disease)   . 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[place])   . Sjogren's syndrome ([place])   . Syncope   . Tendonitis, Achilles, right   . Tortuous colon   . Trigger finger     Past Surgical History:  Procedure Laterality Date  . ABDOMINAL HYSTERECTOMY    . BREAST BIOPSY    . BREAST  EXCISIONAL BIOPSY Left 1986   neg  . CHOLECYSTECTOMY    . COLONOSCOPY WITH PROPOFOL N/A 08/26/2018   Procedure: COLONOSCOPY WITH PROPOFOL;  Surgeon: Lucilla Lame, MD;  Location: [facility]COPY;  Service: Endoscopy;  Laterality: N/A;  . CYSTOSCOPY  02/26/2018   [name], MD   . distal arthrectomy    . ESOPHAGOGASTRODUODENOSCOPY (EGD) WITH PROPOFOL N/A 08/26/2018   Procedure: ESOPHAGOGASTRODUODENOSCOPY (EGD) WITH PROPOFOL;  Surgeon: Lucilla Lame, MD;  Location: [facility] ENDOSCOPY;  Service: Endoscopy;  Laterality: N/A;  . EXCISION NEUROMA    . KNEE SURGERY Bilateral    1985, 2001, 11/2002, 12/2006  . panhysterectomy  10/1983  . SHOULDER SURGERY Right    reverse total arthroplasty w/ biceps tenodesis  . TONSILLECTOMY AND ADENOIDECTOMY    . TOTAL HIP ARTHROPLASTY Right 04/2007  . WISDOM TOOTH EXTRACTION      Vitals:   04/28/20 1521  BP: 135/61     Subjective Assessment - 04/28/20 1521    Subjective Pt. states that her R knee is slightly painful. Pt. states she is no longer using brace. Pt. is meeting with surgeon in January.    Pertinent History Pt. from [place] and came to [place] to take care of friend and then Covid happened.  Pt. retired.  Pts. ex-husband lives in  her house in [place].  R shoulder limitations (5# lifting restrictions).  Pt. went to [employer] for 10 years (every week) with benefits reported in low back.    Limitations Standing;Walking;Writing    Patient Stated Goals Decrease back pain/ improve strength/ mobility.    Currently in Pain? Yes    Pain Score 3     Pain Location Knee    Pain Orientation Right    Pain Descriptors / Indicators Aching    Pain Onset --   30 yrs          BP: 135/61 mmHg   There Ex:  LAQ 4lb ankle weight on R: 2x10. Cueing for improving R knee ext  Seated R hamstring curls against RTB: 25x  STS from blue mat: 25x, cueing to weight R leg more, blue mat decreased in height throughout reps   Nustep: 10 mins L2 no UE (unbilled)   Frequent rest breaks  to check BP and ensure no significant increase in BP.          PT Long Term Goals - 04/21/20 1631      PT LONG TERM GOAL #1   Title Pt. will improve FOTO to predicted score to demonstrate improvement in pain free functional mobility.    Baseline 11/11: TBD    Time 8    Period Weeks    Status New    Target Date 06/16/20      PT LONG TERM GOAL #2   Title Pt. will improve R knee flexion AROM to at least 120 deg to improve stair climbing ability without compensations.    Baseline 11/11: R knee flex AROM: 105 deg    Time 8    Period Weeks    Status New    Target Date 06/16/20      PT LONG TERM GOAL #3   Title Pt. will improve R knee flexion strength to 5/5 without pain to improve pain free functional mobility.    Baseline 11/11: R knee flex: 4+/5 with pain    Time 8    Period Weeks    Status New    Target Date 06/16/20                 Plan - 04/28/20 1555    Clinical Impression Statement Pt. able to complete few R knee strengthening exercises to increase quad and hamstring strength to decrease R knee pain. Pt. able to complete STS from blue mat with decreasing height as reps went on. Pt. able to complete with cueing to keep weight shifted to R. Pt. will continue to benefit from skilled PT to improve pain free functional mobility.    Personal Factors and Comorbidities Education;Comorbidity 3+    Comorbidities AAA, AVN, HTN, severe LE edema bilat (L>R)    Examination-Activity Limitations Locomotion Level;Squat;Stairs;Stand    Examination-Participation Restrictions Yard Work;Driving;Community Activity    Stability/Clinical Decision Making Evolving/Moderate complexity    Clinical Decision Making Moderate    Rehab Potential Fair    PT Frequency 2x / week    PT Duration 8 weeks    PT Treatment/Interventions ADLs/Self Care Home Management;Electrical Stimulation;Moist Heat;Gait training;Stair training;Functional mobility training;Neuromuscular re-education;Balance  training;Therapeutic exercise;Therapeutic activities;Patient/family education;Manual techniques;Passive range of motion    PT Next Visit Plan FOTO for knee, knee A/P mobs    Consulted and Agree with Plan of Care Patient           Patient will benefit from skilled therapeutic intervention in order to improve the following deficits and  impairments:  Abnormal gait, Pain, Improper body mechanics, Postural dysfunction, Decreased mobility, Decreased activity tolerance, Decreased endurance, Decreased range of motion, Decreased strength, Impaired UE functional use, Impaired flexibility, Difficulty walking  Visit Diagnosis: Muscle weakness (generalized)  Right knee pain, unspecified chronicity  Decreased range of motion of right knee     Problem List Patient Active Problem List   Diagnosis Date Noted  . Nausea 03/31/2020  . Multiple thyroid nodules 12/28/2019  . Thyroid nodule 09/29/2019  . Lymphedema 11/10/2018  . PAD (peripheral artery disease) ([place]) 10/12/2018  . Aortic atherosclerosis ([place]) 10/12/2018  . Carotid stenosis, asymptomatic, bilateral 10/12/2018  . Positive colorectal cancer screening using Cologuard test 10/09/2018  . Gastroesophageal reflux disease   . Acute gastritis without hemorrhage   . Osteoarthritis 04/29/2018  . Hiatal hernia with GERD 04/29/2018  . Hyperlipidemia 04/29/2018  . Lymphedema of both lower extremities 04/29/2018  . Left knee pain 10/23/2016  . Cataracts, bilateral 07/31/2016  . Glaucoma 07/31/2016  . Lateral epicondylitis of left elbow 10/20/2015  . Vitamin D deficiency 10/20/2015  . Type 2 diabetes, controlled, with neuropathy ([place]) 10/16/2015  . Gouty arthritis 06/03/2015  . H/O syncope 06/03/2015  . Mild intermittent asthma 06/03/2015  . Multiple gastric ulcers 06/03/2015  . Schatzki's ring 06/03/2015  . Undifferentiated connective tissue disease ([place]) 06/03/2015  . Bone disorder 04/05/2015  . Dental injury 04/05/2015  . Pericarditis  04/05/2015  . Sjogren's syndrome ([place]) 09/07/2014  . Elevated alkaline phosphatase level 08/24/2014  . Lactose intolerance 08/24/2014  . Obesity 08/24/2014  . Peripheral edema 08/24/2014  . 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 Abdominal adhesions 08/02/2014  . 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[place]) 08/02/2014  . Degenerative joint disease (DJD) of lumbar spine 08/02/2014  . Diverticulosis of both small and large intestine 08/02/2014  . Hyperuricemia 08/02/2014  . Pure hypercholesterolemia 08/02/2014  . Trigger finger 08/02/2014  . Right shoulder pain 07/29/2014  . Benign neoplasm of colon 06/30/2010  . Right upper quadrant pain 06/30/2010   Pura Spice, PT, DPT # [phone] 04/29/2020, 9:47 AM  [facility] [address] [place], [place], [address] Phone: [phone]   Fax:  [phone]  Name: Linnea Todisco MRN: [id number] Date of Birth: 1946/01/23

## 2020-05-02 ENCOUNTER — Ambulatory Visit: Payer: Medicare Other | Admitting: Occupational Therapy

## 2020-05-02 ENCOUNTER — Other Ambulatory Visit: Payer: Self-pay

## 2020-05-02 DIAGNOSIS — I890 Lymphedema, not elsewhere classified: Secondary | ICD-10-CM | POA: Diagnosis not present

## 2020-05-02 NOTE — Therapy (Addendum)
[place] MAIN [facility] SERVICES [address] [place], [place], [address] Phone: [phone]   Fax:  [phone]  Occupational Therapy Treatment  Patient Details  Name: [name] MRN: [id number] Date of Birth: 1946-04-26 Referring Provider (OT): Ida Rogue, MD   Encounter Date: 05/02/2020   OT End of Session - 05/02/20 1611    Visit Number 20    Number of Visits 36    Date for OT Re-Evaluation 05/08/20    OT Start Time 0205    OT Stop Time 0305    OT Time Calculation (min) 60 min    Activity Tolerance Patient tolerated treatment well;No increased pain    Behavior During Therapy WFL for tasks assessed/performed           Past Medical History:  Diagnosis Date  . Anterolisthesis    Cervical spine  . Asthma   . Connective tissue disorder (HCC)    recurrent carotid arteritis, temporal arteritis, vasculitis mandible, general hepatitis, avascular necrosis bilat  . DDD (degenerative disc disease), lumbar   . Diabetes mellitus without complication ([place])   . Diverticulosis   . Duodenitis   . Gouty arthritis   . Hiatal hernia with GERD   . History of cardiovascular stress test    a. 07/2018 [facility]): Fixed inferoapical defect w/ nl contraction-->attenuation artifact. No ischemia. EF 63%.  . Hyperlipidemia   . Hypertension    a. 02/2019 Renal artery duplex: no evidence of prox RAS.  Marland Kitchen Hyperuricemia   . Keratitis sicca, bilateral ([place])   . Lymphedema of both lower extremities   . Osteoarthritis   . Pericarditis    Recurrent: Aug 97, July 05, Sept 08, July 16, July 18  . PUD (peptic ulcer disease)   . 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[place])   . Sjogren's syndrome ([place])   . Syncope   . Tendonitis, Achilles, right   . Tortuous colon   . Trigger finger     Past Surgical History:  Procedure Laterality Date  . ABDOMINAL HYSTERECTOMY    . BREAST BIOPSY    . BREAST EXCISIONAL BIOPSY Left 1986   neg  . CHOLECYSTECTOMY    .  COLONOSCOPY WITH PROPOFOL N/A 08/26/2018   Procedure: COLONOSCOPY WITH PROPOFOL;  Surgeon: Lucilla Lame, MD;  Location: [facility] ENDOSCOPY;  Service: Endoscopy;  Laterality: N/A;  . CYSTOSCOPY  02/26/2018   [name], MD   . distal arthrectomy    . ESOPHAGOGASTRODUODENOSCOPY (EGD) WITH PROPOFOL N/A 08/26/2018   Procedure: ESOPHAGOGASTRODUODENOSCOPY (EGD) WITH PROPOFOL;  Surgeon: Lucilla Lame, MD;  Location: [facility] ENDOSCOPY;  Service: Endoscopy;  Laterality: N/A;  . EXCISION NEUROMA    . KNEE SURGERY Bilateral    1985, 2001, 11/2002, 12/2006  . panhysterectomy  10/1983  . SHOULDER SURGERY Right    reverse total arthroplasty w/ biceps tenodesis  . TONSILLECTOMY AND ADENOIDECTOMY    . TOTAL HIP ARTHROPLASTY Right 04/2007  . WISDOM TOOTH EXTRACTION      There were no vitals filed for this visit.   Subjective Assessment - 05/02/20 1348    Subjective  [name] presents for OT Rx visit  20/36 to address BLE lymphedema. Pt reports 1/10 LE-related leg pain  today. Pt's L custom knee length compression stocking arrives today for fitting.    Pertinent History PMHx extensive; Dx contributing to leg swelling include OA, B knee and R hip arthroplasties, asthma, HTN. Suspect LE venous insufficiency.    Limitations chronic pain, chronic leg swelling, generalized weakness, kyphotic  posture    Repetition Increases Symptoms    Special Tests + stemmer sign bilaterally base of toes    Patient Stated Goals Reduce LE edema    Pain Onset --   30 yrs                       OT Treatments/Exercises (OP) - 05/02/20 0001      ADLs   ADL Education Given Yes      Manual Therapy   Manual Therapy Edema management    Edema Management remeasured for LLE custom compression garment and faxed to vendor to correct fit and comfort    Manual Lymphatic Drainage (MLD) MLD to LLE as established    Compression Bandaging LLE short stretch knee length wraps as established                  OT  Education - 05/02/20 1611    Education Details Continued skilled Pt/caregiver education  And LE ADL training throughout visit for lymphedema self care/ home program, including compression wrapping, compression garment and device wear/care, lymphatic pumping ther ex, simple self-MLD, and skin care. Discussed progress towards goals.    Person(s) Educated Patient    Methods Explanation;Demonstration;Handout    Comprehension Verbalized understanding;Returned demonstration               OT Long Term Goals - 03/28/20 1421      OT LONG TERM GOAL #1   Title With modified independent (extra time) Pt will be able to apply knee length, multi-layer, short stretch compression wraps daily from ankle to tibial tuberosity on one leg at a time using correct gradient techniques to return affected limb/s, as closely as possible, to premorbid size and shape, to limit leg pain and infection risk, and to improve safe functional mobility and ADLs performance.    Baseline Dependent    Time 4    Period Days    Status Achieved   achieved 4th visit     OT LONG TERM GOAL #2   Title Pt will be able to verbalize signs and symptoms of cellulitis infection and identify lymphedema precautions using printed resource (modified independence) for reference to decrease infection risk and limit LE progression over time.    Baseline Max A    Time 4    Period Days    Status Achieved      OT LONG TERM GOAL #3   Title Pt will sustain a least 85% compliance with all daily LE self-care home program components throughout Intensive Phase CDT, including impeccable skin care, lymphatic pumping ther ex,  compression wraps and simple self MLD, to ensure optimal limb volume reduction, to limit infection risk and to limit LE progression.    Baseline dependent    Time 12    Period Weeks    Status Achieved   "I'm 98% !"     OT LONG TERM GOAL #4   Title Using assistive devices (modified independence)  Pt will be able to don and doff  appropriate daytime compression garments to limit edema re-accumulation and further progression by issue date.    Baseline Max A    Time 12    Period Weeks    Status On-going      OT LONG TERM GOAL #5   Title After skilled teaching Pt  will be able to perform all lymphedema self-care home program components with modified independence (extra time, assistive devices PRN) , including simple self MLD, lymphatic  pumping exercises, don, doff and wear compression garments  daily, sustain   impeccable skin care daily, to limit lymphedema progression and infection risk.    Baseline Max A    Time 12    Period Weeks    Status On-going   Pt is trained for comrpession wrapping skin care and ther ex. She is still working on learning simple self MLD as of 03/21/20     OT LONG TERM GOAL #6   Title Pt will increase score on FOTO 10 points, from 45 to 55/100, to improve functional performance of basic ADLs.    Baseline Max A    Time 12    Period Weeks    Status On-going                 Plan - 05/02/20 1611    Clinical Impression Statement Completed new measurementrs for LLE ccl 2 classic Elvarex knee high. Pt reports garment bunched at cY landmark after front edge slid backwards towards toes. She feels length is fine, but is conmcerened that garment is farr roo agressive at ankle. Not only  did we remeasure and enlarge at the cy, and a few other, landmark, but we recuces to ccl1 from 2 and replaced classic Elvarex with Elvarex SOFT. Remainder of session devoted to MLD and compression wrap. Faxed new measureements to vendor. Cont as per POC.    OT Occupational Profile and History Comprehensive Assessment- Review of records and extensive additional review of physical, cognitive, psychosocial history related to current functional performance           OT Occupational Profile and History Comprehensive Assessment- Review of records and extensive additional review of physical, cognitive, psychosocial  history related to current functional performance     Occupational performance deficits (Please refer to evaluation for details): ADL's;IADL's;Social Participation;Work;Leisure;Other   body image    Body Structure / Function / Physical Skills ADL;Decreased knowledge of precautions;ROM;Balance;Decreased knowledge of use of DME;Mobility;Edema;Skin integrity;Pain;IADL     Rehab Potential Good     Clinical Decision Making Several treatment options, min-mod task modification necessary     Comorbidities Affecting Occupational Performance: Presence of comorbidities impacting occupational performance     Modification or Assistance to Complete Evaluation  Min-Moderate modification of tasks or assist with assess necessary to complete eval     OT Frequency 2x / week     OT Duration 12 weeks   and PRN    OT Treatment/Interventions Self-care/ADL training;Therapeutic exercise;Manual lymph drainage;Compression bandaging;Patient/family education;Other (comment);Therapeutic activities;DME and/or AE instruction;Manual Therapy     Plan Intensive Phase CDT: 1 leg at a time to limit fall risk. MLD, skin care, ther ex , cimpression wraps -> daytime garments, HOS device, consider Flexitouch if appropriate     Recommended Other Services Fit w/ appropriate compression garments that are effective, comfortable and  Pt can don using AE and extra time     Consulted and Agree with Plan of Care Patient                     Patient will benefit from skilled therapeutic intervention in order to improve the following deficits and impairments:           Visit Diagnosis: Lymphedema, not elsewhere classified    Problem List Patient Active Problem List   Diagnosis Date Noted  . Nausea 03/31/2020  . Multiple thyroid nodules 12/28/2019  . Thyroid nodule 09/29/2019  . Lymphedema 11/10/2018  . PAD (peripheral artery disease) ([place]) 10/12/2018  .  Aortic atherosclerosis ([place]) 10/12/2018  . Carotid stenosis,  asymptomatic, bilateral 10/12/2018  . Positive colorectal cancer screening using Cologuard test 10/09/2018  . Gastroesophageal reflux disease   . Acute gastritis without hemorrhage   . Osteoarthritis 04/29/2018  . Hiatal hernia with GERD 04/29/2018  . Hyperlipidemia 04/29/2018  . Lymphedema of both lower extremities 04/29/2018  . Left knee pain 10/23/2016  . Cataracts, bilateral 07/31/2016  . Glaucoma 07/31/2016  . Lateral epicondylitis of left elbow 10/20/2015  . Vitamin D deficiency 10/20/2015  . Type 2 diabetes, controlled, with neuropathy ([place]) 10/16/2015  . Gouty arthritis 06/03/2015  . H/O syncope 06/03/2015  . Mild intermittent asthma 06/03/2015  . Multiple gastric ulcers 06/03/2015  . Schatzki's ring 06/03/2015  . Undifferentiated connective tissue disease ([place]) 06/03/2015  . Bone disorder 04/05/2015  . Dental injury 04/05/2015  . Pericarditis 04/05/2015  . Sjogren's syndrome ([place]) 09/07/2014  . Elevated alkaline phosphatase level 08/24/2014  . Lactose intolerance 08/24/2014  . Obesity 08/24/2014  . Peripheral edema 08/24/2014  . 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 Abdominal adhesions 08/02/2014  . 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[place]) 08/02/2014  . Degenerative joint disease (DJD) of lumbar spine 08/02/2014  . Diverticulosis of both small and large intestine 08/02/2014  . Hyperuricemia 08/02/2014  . Pure hypercholesterolemia 08/02/2014  . Trigger finger 08/02/2014  . Right shoulder pain 07/29/2014  . Benign neoplasm of colon 06/30/2010  . Right upper quadrant pain 06/30/2010    Andrey Spearman, MS, OTR/L, [facility] 05/02/20 4:15 PM  [place] MAIN [facility] SERVICES [address] [place], [place], [address] Phone: [phone]   Fax:  [phone]  Name: Amylah Will MRN: [id number] Date of Birth: August 25, 1945

## 2020-05-03 ENCOUNTER — Telehealth: Payer: Self-pay

## 2020-05-03 DIAGNOSIS — T50Z95A Adverse effect of other vaccines and biological substances, initial encounter: Secondary | ICD-10-CM | POA: Diagnosis not present

## 2020-05-03 NOTE — Telephone Encounter (Signed)
I attempted to contact the patient, no answer. I left detail message on her vm that I will send this prescription request to her provider and Harlow Asa, Pharmacist.

## 2020-05-03 NOTE — Telephone Encounter (Addendum)
Copied from [place] [id number]. Topic: General - Inquiry >> May 03, 2020  2:17 PM Lennox Solders wrote: Reason for CRM: Pt is calling and will not go to [facility] to have her lab drawn. Pt changed her mind and would like the order to be put in epic so that she can use our lab . Pt states she needs the complete work up , cbc w/diff , chole etc. Pt does not need a1c. I am not sure if the patient needs to repeat the blood work she had in June 2021   I contacted the patient, no answer. I left a message on the patient vm that I will send her lab order request to the provider and f/u with her as soon as she responds.

## 2020-05-03 NOTE — Telephone Encounter (Cosign Needed)
The patient returned the call to [facility] and said she  had already had this conversation with you but you must have forgot. The blood work was going to be done at the hospital but she wants it done at [facility] office. The blood work should be complete blood work and the inflammatory markers also. Secondly [name] is to be ordered by Elmyra Ricks as soon as possible because it takes a month. She said you know what she means. Give her a call before you order the blood work so she can make sure it's not messed up again. She can be reached at [phone].

## 2020-05-03 NOTE — Telephone Encounter (Signed)
Copied from [place] [phone]. Topic: General - Inquiry >> May 03, 2020  2:17 PM Lennox Solders wrote: Reason for CRM: Pt is calling and will not go to [facility] to have her lab drawn. Pt changed her mind and would like the order to be put in epic so that she can use our lab . Pt states she needs the complete work up , cbc w/diff , chole etc. Pt does not need a1c. I am not sure if the patient needs to repeat the blood work she had in June 2021

## 2020-05-03 NOTE — Telephone Encounter (Signed)
Copied from [place] [phone]. Topic: General - Inquiry >> May 03, 2020  2:14 PM Lennox Solders wrote: Reason for CRM: Pt is calling to talk with carlita . Pt would like tresiba to be reorder through patient assistant program and they send medication to  her home address

## 2020-05-03 NOTE — Telephone Encounter (Signed)
Copied from [place] [phone]. Topic: General - Inquiry >> May 03, 2020  2:14 PM Lennox Solders wrote: Reason for CRM: Pt is calling to talk with carlita . Pt would like tresiba to be reorder through patient assistant program and they send medication to  her home address

## 2020-05-03 NOTE — Telephone Encounter (Signed)
Copied from [place] [phone]. Topic: General - Inquiry >> May 03, 2020  2:17 PM Lennox Solders wrote: Reason for CRM: Pt is calling and will not go to [facility] to have her lab drawn. Pt changed her mind and would like the order to be put in epic so that she can use our lab . Pt states she needs the complete work up , cbc w/diff , chole etc. Pt does not need a1c. I am not sure if the patient needs to repeat the blood work she had in June 2021

## 2020-05-03 NOTE — Telephone Encounter (Signed)
I contacted the patient, no answer. I left a message on the patient vm that I will send her lab order request to the provider and f/u with her as soon as she responds

## 2020-05-03 NOTE — Telephone Encounter (Signed)
Copied from [place] [id number]. Topic: General - Inquiry >> May 03, 2020  2:17 PM Lennox Solders wrote: Reason for CRM: Pt is calling and will not go to [facility] to have her lab drawn. Pt changed her mind and would like the order to be put in epic so that she can use our lab . Pt states she needs the complete work up , cbc w/diff , chole etc. Pt does not need a1c. I am not sure if the patient needs to repeat the blood work she had in June 2021

## 2020-05-04 ENCOUNTER — Ambulatory Visit: Payer: Medicare Other | Admitting: Occupational Therapy

## 2020-05-09 ENCOUNTER — Other Ambulatory Visit: Payer: Self-pay | Admitting: Family Medicine

## 2020-05-09 ENCOUNTER — Ambulatory Visit: Payer: Medicare Other | Admitting: Occupational Therapy

## 2020-05-09 ENCOUNTER — Other Ambulatory Visit: Payer: Self-pay

## 2020-05-09 ENCOUNTER — Ambulatory Visit: Payer: Self-pay | Admitting: Pharmacist

## 2020-05-09 DIAGNOSIS — I890 Lymphedema, not elsewhere classified: Secondary | ICD-10-CM

## 2020-05-09 DIAGNOSIS — E1140 Type 2 diabetes mellitus with diabetic neuropathy, unspecified: Secondary | ICD-10-CM

## 2020-05-09 NOTE — Addendum Note (Signed)
Addended by: Ansel Bong on: 05/09/2020 03:02 PM   Modules accepted: Orders

## 2020-05-09 NOTE — Chronic Care Management (AMB) (Signed)
Chronic Care Management   Follow Up Note   05/09/2020 Name: [name] MRN: [id number] DOB: 1946/05/12  Referred by: Verl Bangs, FNP Reason for referral : Care Coordination ([employer])   [name] is a 74 y.o. year old female who is a primary care patient of [name], [name], FNP. The CCM team was consulted for assistance with chronic disease management and care coordination needs.    Place coordination of care call to [employer] patient assistance program.  Review of patient status, including review of consultants reports, relevant laboratory and other test results, and collaboration with appropriate care team members and the patient's provider was performed as part of comprehensive patient evaluation and provision of chronic care management services.    SDOH (Social Determinants of Health) assessments performed: No See Care Plan activities for detailed interventions related to [facility])     Outpatient Encounter Medications as of 05/09/2020  Medication Sig  . cyclobenzaprine (FLEXERIL) 5 MG tablet TAKE 1 TABLET (5 MG TOTAL) BY MOUTH ONCE DAILY AS NEEDED FOR MUSCLE SPASMS (Patient not taking: Reported on 03/29/2020)  . acetaminophen (TYLENOL) 500 MG tablet Take 500 mg by mouth daily as needed.  . ARTIFICIAL TEAR OP Apply to eye as needed.   . BD INSULIN SYRINGE U/F 31G X 5/16" 1 ML MISC USE AS DIRECTED. WITH HUMALOG  . BIOTIN PO Take 1 mg by mouth daily.   . Cholecalciferol (VITAMIN D3) 25 MCG (1000 UT) CAPS Take 4 capsules by mouth daily.   . clindamycin (CLEOCIN) 300 MG capsule Take 600 mg by mouth. 1 Hour before dental procedures  . cloNIDine (CATAPRES) 0.1 MG tablet Take 1 tablet (0.1 mg) by mouth three times daily  . COLCRYS 0.6 MG tablet Take 1 tablet (0.6 mg total) by mouth 2 (two) times daily as needed. For up to 3 days for pericarditis, or up to 7 days for gout or connective tissue disorder.  . ezetimibe (ZETIA) 10 MG tablet Take 1 tablet (10 mg  total) by mouth daily.  . famotidine (PEPCID) 20 MG tablet Take 20 mg by mouth 2 (two) times daily.  Marland Kitchen FREESTYLE LITE test strip USE AS DIRECTED THREE TIMES DAILY FOR DIABETES  . insulin degludec (TRESIBA FLEXTOUCH) 200 UNIT/ML FlexTouch Pen Inject 56 Units into the skin daily.  . insulin lispro (HUMALOG) 100 UNIT/ML injection Correction scale: take 1 unit per 50 over 150 before meals up to three times daily.  Up to 20 units per day.  . Insulin Pen Needle (PEN NEEDLES) 32G X 5 MM MISC 1 Device by Does not apply route daily.  Marland Kitchen lactase (LACTAID) 3000 units tablet Take by mouth as needed.   . Lancets (FREESTYLE) lancets Must test x4/day  . losartan (COZAAR) 100 MG tablet Take 1 tablet (100 mg total) by mouth daily.  . nebivolol 20 MG TABS Take 1 tablet (20 mg total) by mouth daily.  . ondansetron (ZOFRAN-ODT) 4 MG disintegrating tablet Take 1 tablet (4 mg total) by mouth every 8 (eight) hours as needed for nausea or vomiting.  . prednisoLONE acetate (PRED FORTE) 1 % ophthalmic suspension INSTILL ONE DROP  as needed  . rosuvastatin (CRESTOR) 10 MG tablet TAKE 1 TABLET BY MOUTH EVERY DAY   No facility-administered encounter medications on file as of 05/09/2020.    Goals Addressed              This Visit's Progress   .  PharmD - Medication Assistance (pt-stated)  Current Barriers:  . Financial Barriers: patient has [employer] A[employer] MedicareRx Preferred Part D insurance and reports copay for Tresiba, Humalog, Bystolic and Colcrys is cost prohibitive at this time o Patient currently enrolled in patient assistance for Tresiba, Humalog and Colcrys for 2021 calendar year  Pharmacist Clinical Goal(s):  Marland Kitchen Over the next 30 days, patient will work with PharmD and providers to relieve medication access concerns  Interventions: . Receive coordination of care message from [place] that patient has contacted the office requesting office send a refill request to [employer] patient assistance  program for her [name]. Kelita states office does not have refill reorder form to submit this request. . [employer] patient assistance program. Speak with representative Regine who states that all requests for medication refills for 2021 calendar year must be completed by 05/10/2020 in order to be processed. Regine faxes a copy of the refill request form to office fax # with Attn to [place] follow up message to [facility] with update  Patient Self Care Activities:  . Patient to contact providers for new medical questions/concerns . Patient to attend scheduled medical appointments  Please see past updates related to this goal by clicking on the "Past Updates" button in the selected goal         Plan  The care management team will reach out to the patient again over the next 30 days.   Harlow Asa, PharmD, [place] [employer] [phone]

## 2020-05-09 NOTE — Telephone Encounter (Signed)
What labs does she want to have drawn?  I don't see any outstanding or noted in our last OV.  I believe her [name] is through the PAP program.  Does she want this through her local pharmacy?  I need the paperwork from the PAP program to send otherwise.  TY

## 2020-05-09 NOTE — Therapy (Addendum)
[place] MAIN [facility] SERVICES [address] [place], [place], [address] Phone: [phone]   Fax:  [phone]  Occupational Therapy Treatment  Patient Details  Name: [name] MRN: [id number] Date of Birth: May 28, 1946 Referring Provider (OT): Ida Rogue, MD   Encounter Date: 05/09/2020   OT End of Session - 05/09/20 1456    Visit Number 21   Number of Visits 36    Date for OT Re-Evaluation 08/07/20    Activity Tolerance Patient tolerated treatment well;No increased pain    Behavior During Therapy WFL for tasks assessed/performed           Past Medical History:  Diagnosis Date  . Anterolisthesis    Cervical spine  . Asthma   . Connective tissue disorder (HCC)    recurrent carotid arteritis, temporal arteritis, vasculitis mandible, general hepatitis, avascular necrosis bilat  . DDD (degenerative disc disease), lumbar   . Diabetes mellitus without complication ([place])   . Diverticulosis   . Duodenitis   . Gouty arthritis   . Hiatal hernia with GERD   . History of cardiovascular stress test    a. 07/2018 [facility]): Fixed inferoapical defect w/ nl contraction-->attenuation artifact. No ischemia. EF 63%.  . Hyperlipidemia   . Hypertension    a. 02/2019 Renal artery duplex: no evidence of prox RAS.  Marland Kitchen Hyperuricemia   . Keratitis sicca, bilateral ([place])   . Lymphedema of both lower extremities   . Osteoarthritis   . Pericarditis    Recurrent: Aug 97, July 05, Sept 08, July 16, July 18  . PUD (peptic ulcer disease)   . 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[place])   . Sjogren's syndrome ([place])   . Syncope   . Tendonitis, Achilles, right   . Tortuous colon   . Trigger finger     Past Surgical History:  Procedure Laterality Date  . ABDOMINAL HYSTERECTOMY    . BREAST BIOPSY    . BREAST EXCISIONAL BIOPSY Left 1986   neg  . CHOLECYSTECTOMY    . COLONOSCOPY WITH PROPOFOL N/A 08/26/2018   Procedure: COLONOSCOPY WITH PROPOFOL;  Surgeon:  Lucilla Lame, MD;  Location[facility] ENDOSCOPY;  Service: Endoscopy;  Laterality: N/A;  . CYSTOSCOPY  02/26/2018   [name], MD   . distal arthrectomy    . ESOPHAGOGASTRODUODENOSCOPY (EGD) WITH PROPOFOL N/A 08/26/2018   Procedure: ESOPHAGOGASTRODUODENOSCOPY (EGD) WITH PROPOFOL;  Surgeon: Lucilla Lame, MD;  Location: [facility] ENDOSCOPY;  Service: Endoscopy;  Laterality: N/A;  . EXCISION NEUROMA    . KNEE SURGERY Bilateral    1985, 2001, 11/2002, 12/2006  . panhysterectomy  10/1983  . SHOULDER SURGERY Right    reverse total arthroplasty w/ biceps tenodesis  . TONSILLECTOMY AND ADENOIDECTOMY    . TOTAL HIP ARTHROPLASTY Right 04/2007  . WISDOM TOOTH EXTRACTION      There were no vitals filed for this visit.   Subjective Assessment - 05/09/20 1424    Subjective  [name] presents for OT Rx visit  21/ 36 to address BLE lymphedema. Pt reports 2/10 LE-related leg pain  today. Pt presents with compression wrap on L leg.She wrapped over the long holiday weekend.    Pertinent History PMHx extensive; Dx contributing to leg swelling include OA, B knee and R hip arthroplasties, asthma, HTN. Suspect LE venous insufficiency.    Limitations chronic pain, chronic leg swelling, generalized weakness, kyphotic posture    Repetition Increases Symptoms    Special Tests + stemmer sign bilaterally base of toes  Patient Stated Goals Reduce LE edema    Pain Onset --   30 yrs                       OT Treatments/Exercises (OP) - 05/09/20 0001      ADLs   ADL Education Given Yes      Manual Therapy   Manual Therapy Edema management;Manual Lymphatic Drainage (MLD)    Manual Lymphatic Drainage (MLD) MLD to LLE as established    Compression Bandaging LLE short stretch knee length wraps as established                  OT Education - 05/09/20 1456    Education Details Continued skilled Pt/caregiver education  And LE ADL training throughout visit for lymphedema self care/ home program,  including compression wrapping, compression garment and device wear/care, lymphatic pumping ther ex, simple self-MLD, and skin care. Discussed progress towards goals.    Person(s) Educated Patient    Methods Explanation;Demonstration;Handout    Comprehension Verbalized understanding;Returned demonstration               OT Long Term Goals - 03/28/20 1421      OT LONG TERM GOAL #1   Title With modified independent (extra time) Pt will be able to apply knee length, multi-layer, short stretch compression wraps daily from ankle to tibial tuberosity on one leg at a time using correct gradient techniques to return affected limb/s, as closely as possible, to premorbid size and shape, to limit leg pain and infection risk, and to improve safe functional mobility and ADLs performance.    Baseline Dependent    Time 4    Period Days    Status Achieved   achieved 4th visit     OT LONG TERM GOAL #2   Title Pt will be able to verbalize signs and symptoms of cellulitis infection and identify lymphedema precautions using printed resource (modified independence) for reference to decrease infection risk and limit LE progression over time.    Baseline Max A    Time 4    Period Days    Status Achieved      OT LONG TERM GOAL #3   Title Pt will sustain a least 85% compliance with all daily LE self-care home program components throughout Intensive Phase CDT, including impeccable skin care, lymphatic pumping ther ex,  compression wraps and simple self MLD, to ensure optimal limb volume reduction, to limit infection risk and to limit LE progression.    Baseline dependent    Time 12    Period Weeks    Status Achieved   "I'm 98% !"     OT LONG TERM GOAL #4   Title Using assistive devices (modified independence)  Pt will be able to don and doff appropriate daytime compression garments to limit edema re-accumulation and further progression by issue date.    Baseline Max A    Time 12    Period Weeks     Status On-going      OT LONG TERM GOAL #5   Title After skilled teaching Pt  will be able to perform all lymphedema self-care home program components with modified independence (extra time, assistive devices PRN) , including simple self MLD, lymphatic pumping exercises, don, doff and wear compression garments  daily, sustain   impeccable skin care daily, to limit lymphedema progression and infection risk.    Baseline Max A    Time 12    Period  Weeks    Status On-going   Pt is trained for comrpession wrapping skin care and ther ex. She is still working on learning simple self MLD as of 03/21/20     OT LONG TERM GOAL #6   Title Pt will increase score on FOTO 10 points, from 45 to 55/100, to improve functional performance of basic ADLs.    Baseline Max A    Time 12    Period Weeks    Status On-going                 Plan - 05/09/20 1456    Clinical Impression Statement Pt tolerated MLD, skin care and gradient compression wrap to LLE with no increased pain. Pt continues to wear knee brace on R knee. Fit "remake" L custom compression garment as soon as available and commence cdt to RLE.    OT Occupational Profile and History Comprehensive Assessment- Review of records and extensive additional review of physical, cognitive, psychosocial history related to current functional performance           OT Occupational Profile and History Comprehensive Assessment- Review of records and extensive additional review of physical, cognitive, psychosocial history related to current functional performance     Occupational performance deficits (Please refer to evaluation for details): ADL's;IADL's;Social Participation;Work;Leisure;Other   body image    Body Structure / Function / Physical Skills ADL;Decreased knowledge of precautions;ROM;Balance;Decreased knowledge of use of DME;Mobility;Edema;Skin integrity;Pain;IADL     Rehab Potential Good     Clinical Decision Making Several treatment options,  min-mod task modification necessary     Comorbidities Affecting Occupational Performance: Presence of comorbidities impacting occupational performance     Modification or Assistance to Complete Evaluation  Min-Moderate modification of tasks or assist with assess necessary to complete eval     OT Frequency 2x / week     OT Duration 12 weeks   and PRN    OT Treatment/Interventions Self-care/ADL training;Therapeutic exercise;Manual lymph drainage;Compression bandaging;Patient/family education;Other (comment);Therapeutic activities;DME and/or AE instruction;Manual Therapy     Plan Intensive Phase CDT: 1 leg at a time to limit fall risk. MLD, skin care, ther ex , cimpression wraps -> daytime garments, HOS device, consider Flexitouch if appropriate     Recommended Other Services Fit w/ appropriate compression garments that are effective, comfortable and  Pt can don using AE and extra time     Consulted and Agree with Plan of Care Patient                    Patient will benefit from skilled therapeutic intervention in order to improve the following deficits and impairments:           Visit Diagnosis: Lymphedema, not elsewhere classified    Problem List Patient Active Problem List   Diagnosis Date Noted  . Nausea 03/31/2020  . Multiple thyroid nodules 12/28/2019  . Thyroid nodule 09/29/2019  . Lymphedema 11/10/2018  . PAD (peripheral artery disease) ([place]) 10/12/2018  . Aortic atherosclerosis ([place]) 10/12/2018  . Carotid stenosis, asymptomatic, bilateral 10/12/2018  . Positive colorectal cancer screening using Cologuard test 10/09/2018  . Gastroesophageal reflux disease   . Acute gastritis without hemorrhage   . Osteoarthritis 04/29/2018  . Hiatal hernia with GERD 04/29/2018  . Hyperlipidemia 04/29/2018  . Lymphedema of both lower extremities 04/29/2018  . Left knee pain 10/23/2016  . Cataracts, bilateral 07/31/2016  . Glaucoma 07/31/2016  . Lateral epicondylitis of left elbow  10/20/2015  . Vitamin D deficiency 10/20/2015  .  Type 2 diabetes, controlled, with neuropathy ([place]) 10/16/2015  . Gouty arthritis 06/03/2015  . H/O syncope 06/03/2015  . Mild intermittent asthma 06/03/2015  . Multiple gastric ulcers 06/03/2015  . Schatzki's ring 06/03/2015  . Undifferentiated connective tissue disease ([place]) 06/03/2015  . Bone disorder 04/05/2015  . Dental injury 04/05/2015  . Pericarditis 04/05/2015  . Sjogren's syndrome ([place]) 09/07/2014  . Elevated alkaline phosphatase level 08/24/2014  . Lactose intolerance 08/24/2014  . Obesity 08/24/2014  . Peripheral edema 08/24/2014  . 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 Abdominal adhesions 08/02/2014  . 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[place]) 08/02/2014  . Degenerative joint disease (DJD) of lumbar spine 08/02/2014  . Diverticulosis of both small and large intestine 08/02/2014  . Hyperuricemia 08/02/2014  . Pure hypercholesterolemia 08/02/2014  . Trigger finger 08/02/2014  . Right shoulder pain 07/29/2014  . Benign neoplasm of colon 06/30/2010  . Right upper quadrant pain 06/30/2010    Andrey Spearman, MS, OTR/L, [facility] 05/09/20 2:58 PM   [place] MAIN [facility] SERVICES [address] [place], [place], [address] Phone: [phone]   Fax:  [phone]  Name: [name] MRN: [id number] Date of Birth: 1945/07/06

## 2020-05-10 ENCOUNTER — Other Ambulatory Visit: Payer: Self-pay

## 2020-05-10 ENCOUNTER — Ambulatory Visit: Payer: Medicare Other | Admitting: Physical Therapy

## 2020-05-10 DIAGNOSIS — M545 Low back pain, unspecified: Secondary | ICD-10-CM

## 2020-05-10 DIAGNOSIS — M25661 Stiffness of right knee, not elsewhere classified: Secondary | ICD-10-CM | POA: Diagnosis not present

## 2020-05-10 DIAGNOSIS — M6281 Muscle weakness (generalized): Secondary | ICD-10-CM

## 2020-05-10 DIAGNOSIS — G8929 Other chronic pain: Secondary | ICD-10-CM | POA: Diagnosis not present

## 2020-05-10 DIAGNOSIS — M25561 Pain in right knee: Secondary | ICD-10-CM | POA: Diagnosis not present

## 2020-05-11 ENCOUNTER — Ambulatory Visit: Payer: Medicare Other | Attending: Cardiovascular Disease | Admitting: Occupational Therapy

## 2020-05-11 ENCOUNTER — Encounter: Payer: Self-pay | Admitting: Physical Therapy

## 2020-05-11 DIAGNOSIS — I890 Lymphedema, not elsewhere classified: Secondary | ICD-10-CM | POA: Diagnosis present

## 2020-05-11 NOTE — Therapy (Signed)
[facility] [address] [place], [place], [address] Phone: [phone]   Fax:  [phone]  Physical Therapy Treatment  Patient Details  Name: [name] MRN: [id number] Date of Birth: 05/02/1946 Referring Provider (PT): Dr. Harlow Mares   Encounter Date: 05/10/2020   PT End of Session - 05/11/20 0909    Visit Number 25    Number of Visits 38    Date for PT Re-Evaluation 06/16/20    Authorization - Visit Number 3    Authorization - Number of Visits 10    PT Start Time 1601    PT Stop Time 1510    PT Time Calculation (min) 65 min    Activity Tolerance Patient tolerated treatment well    Behavior During Therapy [facility] for tasks assessed/performed           Past Medical History:  Diagnosis Date  . Anterolisthesis    Cervical spine  . Asthma   . Connective tissue disorder (HCC)    recurrent carotid arteritis, temporal arteritis, vasculitis mandible, general hepatitis, avascular necrosis bilat  . DDD (degenerative disc disease), lumbar   . Diabetes mellitus without complication ([place])   . Diverticulosis   . Duodenitis   . Gouty arthritis   . Hiatal hernia with GERD   . History of cardiovascular stress test    a. 07/2018 [facility]): Fixed inferoapical defect w/ nl contraction-->attenuation artifact. No ischemia. EF 63%.  . Hyperlipidemia   . Hypertension    a. 02/2019 Renal artery duplex: no evidence of prox RAS.  Marland Kitchen Hyperuricemia   . Keratitis sicca, bilateral ([place])   . Lymphedema of both lower extremities   . Osteoarthritis   . Pericarditis    Recurrent: Aug 97, July 05, Sept 08, July 16, July 18  . PUD (peptic ulcer disease)   . 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[place])   . Sjogren's syndrome ([place])   . Syncope   . Tendonitis, Achilles, right   . Tortuous colon   . Trigger finger     Past Surgical History:  Procedure Laterality Date  . ABDOMINAL HYSTERECTOMY    . BREAST BIOPSY    . BREAST EXCISIONAL BIOPSY Left 1986    neg  . CHOLECYSTECTOMY    . COLONOSCOPY WITH PROPOFOL N/A 08/26/2018   Procedure: COLONOSCOPY WITH PROPOFOL;  Surgeon: Lucilla Lame, MD;  Location: [facility] ENDOSCOPY;  Service: Endoscopy;  Laterality: N/A;  . CYSTOSCOPY  02/26/2018   [name], MD   . distal arthrectomy    . ESOPHAGOGASTRODUODENOSCOPY (EGD) WITH PROPOFOL N/A 08/26/2018   Procedure: ESOPHAGOGASTRODUODENOSCOPY (EGD) WITH PROPOFOL;  Surgeon: Lucilla Lame, MD;  Location: [facility] ENDOSCOPY;  Service: Endoscopy;  Laterality: N/A;  . EXCISION NEUROMA    . KNEE SURGERY Bilateral    1985, 2001, 11/2002, 12/2006  . panhysterectomy  10/1983  . SHOULDER SURGERY Right    reverse total arthroplasty w/ biceps tenodesis  . TONSILLECTOMY AND ADENOIDECTOMY    . TOTAL HIP ARTHROPLASTY Right 04/2007  . WISDOM TOOTH EXTRACTION      There were no vitals filed for this visit.   Subjective Assessment - 05/11/20 0905    Subjective Pt. states she had a sharp pain in R knee the other day and her knee has been feeling much better ever since.  Pt. reports slight discomfort in low back.  Pt. presents with significant swelling in R lower leg/ foot and comtinues to present with lymphdema wrapping/ walking shoe on L.    Pertinent History  Pt. from [place] and came to [place] to take care of friend and then Covid happened.  Pt. retired.  Pts. ex-husband lives in her house in [place].  R shoulder limitations (5# lifting restrictions).  Pt. went to [employer] [facility] [employer] for 10 years (every week) with benefits reported in low back.    Limitations Standing;Walking;Writing    Patient Stated Goals Decrease back pain/ improve strength/ mobility.    Currently in Pain? No/denies   no subjective pain score noted   Pain Onset --   30 yrs            There Ex:  MH to low back in sitting prior to tx. Session/ explained knee anatomy and MRI results.    Sit to stands from gray chair with proper technique  Resisted gait in //-bars (with mirror feedback): 2BTB 6x all 4-planes of movement.     Reviewed LE ex. Program  Nustep L2 10 min. B UE/LE (consistent cadence with no increase c/o pain).      PT Long Term Goals - 04/21/20 1631      PT LONG TERM GOAL #1   Title Pt. will improve FOTO to predicted score to demonstrate improvement in pain free functional mobility.    Baseline 11/11: TBD    Time 8    Period Weeks    Status New    Target Date 06/16/20      PT LONG TERM GOAL #2   Title Pt. will improve R knee flexion AROM to at least 120 deg to improve stair climbing ability without compensations.    Baseline 11/11: R knee flex AROM: 105 deg    Time 8    Period Weeks    Status New    Target Date 06/16/20      PT LONG TERM GOAL #3   Title Pt. will improve R knee flexion strength to 5/5 without pain to improve pain free functional mobility.    Baseline 11/11: R knee flex: 4+/5 with pain    Time 8    Period Weeks    Status New    Target Date 06/16/20                 Plan - 05/11/20 0913    Clinical Impression Statement Pt. ambulates around PT clinic and completes resisted gait with improve gait pattern/ R LE control compared to last tx. session.  Pt. able to complete 10 minutes on Nustep with no increase c/o R knee pain and pt. states several times during tx. session that her knee is "feeling better".  PT explained anatomy of R knee joint and helped pt. understand her knee MRI.    Personal Factors and Comorbidities Education;Comorbidity 3+    Comorbidities AAA, AVN, HTN, severe LE edema bilat (L>R)    Examination-Activity Limitations Locomotion Level;Squat;Stairs;Stand    Examination-Participation Restrictions Yard Work;Driving;Community Activity    Stability/Clinical Decision Making Evolving/Moderate complexity    Clinical Decision Making Moderate    Rehab Potential Fair    PT Frequency 2x / week    PT Duration 8 weeks    PT Treatment/Interventions ADLs/Self Care Home Management;Electrical Stimulation;Moist Heat;Gait training;Stair training;Functional  mobility training;Neuromuscular re-education;Balance training;Therapeutic exercise;Therapeutic activities;Patient/family education;Manual techniques;Passive range of motion    PT Next Visit Plan R knee AROM/ strength reassessment.    Consulted and Agree with Plan of Care Patient           Patient will benefit from skilled therapeutic intervention in order to improve the following deficits and impairments:  Abnormal gait, Pain, Improper body mechanics, Postural dysfunction, Decreased mobility, Decreased activity tolerance, Decreased endurance, Decreased range of motion, Decreased strength, Impaired UE functional use, Impaired flexibility, Difficulty walking  Visit Diagnosis: Muscle weakness (generalized)  Right knee pain, unspecified chronicity  Decreased range of motion of right knee  Chronic bilateral low back pain without sciatica     Problem List Patient Active Problem List   Diagnosis Date Noted  . Nausea 03/31/2020  . Multiple thyroid nodules 12/28/2019  . Thyroid nodule 09/29/2019  . Lymphedema 11/10/2018  . PAD (peripheral artery disease) ([place]) 10/12/2018  . Aortic atherosclerosis ([place]) 10/12/2018  . Carotid stenosis, asymptomatic, bilateral 10/12/2018  . Positive colorectal cancer screening using Cologuard test 10/09/2018  . Gastroesophageal reflux disease   . Acute gastritis without hemorrhage   . Osteoarthritis 04/29/2018  . Hiatal hernia with GERD 04/29/2018  . Hyperlipidemia 04/29/2018  . Lymphedema of both lower extremities 04/29/2018  . Left knee pain 10/23/2016  . Cataracts, bilateral 07/31/2016  . Glaucoma 07/31/2016  . Lateral epicondylitis of left elbow 10/20/2015  . Vitamin D deficiency 10/20/2015  . Type 2 diabetes, controlled, with neuropathy ([place]) 10/16/2015  . Gouty arthritis 06/03/2015  . H/O syncope 06/03/2015  . Mild intermittent asthma 06/03/2015  . Multiple gastric ulcers 06/03/2015  . Schatzki's ring 06/03/2015  . Undifferentiated  connective tissue disease ([place]) 06/03/2015  . Bone disorder 04/05/2015  . Dental injury 04/05/2015  . Pericarditis 04/05/2015  . Sjogren's syndrome ([place]) 09/07/2014  . Elevated alkaline phosphatase level 08/24/2014  . Lactose intolerance 08/24/2014  . Obesity 08/24/2014  . Peripheral edema 08/24/2014  . 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 Abdominal adhesions 08/02/2014  . 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[place]) 08/02/2014  . Degenerative joint disease (DJD) of lumbar spine 08/02/2014  . Diverticulosis of both small and large intestine 08/02/2014  . Hyperuricemia 08/02/2014  . Pure hypercholesterolemia 08/02/2014  . Trigger finger 08/02/2014  . Right shoulder pain 07/29/2014  . Benign neoplasm of colon 06/30/2010  . Right upper quadrant pain 06/30/2010   Pura Spice, PT, DPT # [phone] 05/11/2020, 9:15 AM  [place] [facility] [address] [place], [place], [address] Phone: [phone]   Fax:  [phone]  Name: [name] MRN: [id number] Date of Birth: [birth year]

## 2020-05-11 NOTE — Therapy (Addendum)
[place] MAIN [facility] SERVICES [address] [place], [place], [address] Phone: [phone]   Fax:  [phone]  Occupational Therapy Treatment  Patient Details  Name: [name] MRN: [id number] Date of Birth: [birth year] Referring Provider (OT): Ida Rogue, MD   Encounter Date: 05/11/2020   OT End of Session - 05/11/20 1336    Visit Number 22   Number of Visits 36    Date for OT Re-Evaluation 08/07/20    OT Start Time OT End Time 0203  0303   Activity Tolerance Patient tolerated treatment well;No increased pain    Behavior During Therapy WFL for tasks assessed/performed           Past Medical History:  Diagnosis Date  . Anterolisthesis    Cervical spine  . Asthma   . Connective tissue disorder (HCC)    recurrent carotid arteritis, temporal arteritis, vasculitis mandible, general hepatitis, avascular necrosis bilat  . DDD (degenerative disc disease), lumbar   . Diabetes mellitus without complication ([place])   . Diverticulosis   . Duodenitis   . Gouty arthritis   . Hiatal hernia with GERD   . History of cardiovascular stress test    a. 07/2018 [facility]): Fixed inferoapical defect w/ nl contraction-->attenuation artifact. No ischemia. EF 63%.  . Hyperlipidemia   . Hypertension    a. 02/2019 Renal artery duplex: no evidence of prox RAS.  Marland Kitchen Hyperuricemia   . Keratitis sicca, bilateral ([place])   . Lymphedema of both lower extremities   . Osteoarthritis   . Pericarditis    Recurrent: Aug 97, July 05, Sept 08, July 16, July 18  . PUD (peptic ulcer disease)   . 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[place])   . Sjogren's syndrome ([place])   . Syncope   . Tendonitis, Achilles, right   . Tortuous colon   . Trigger finger     Past Surgical History:  Procedure Laterality Date  . ABDOMINAL HYSTERECTOMY    . BREAST BIOPSY    . BREAST EXCISIONAL BIOPSY Left 1986   neg  . CHOLECYSTECTOMY    . COLONOSCOPY WITH PROPOFOL N/A 08/26/2018    Procedure: COLONOSCOPY WITH PROPOFOL;  Surgeon: Lucilla Lame, MD;  Location: [facility] ENDOSCOPY;  Service: Endoscopy;  Laterality: N/A;  . CYSTOSCOPY  02/26/2018   [name], MD   . distal arthrectomy    . ESOPHAGOGASTRODUODENOSCOPY (EGD) WITH PROPOFOL N/A 08/26/2018   Procedure: ESOPHAGOGASTRODUODENOSCOPY (EGD) WITH PROPOFOL;  Surgeon: Lucilla Lame, MD;  Location: [facility] ENDOSCOPY;  Service: Endoscopy;  Laterality: N/A;  . EXCISION NEUROMA    . KNEE SURGERY Bilateral    1985, 2001, 11/2002, 12/2006  . panhysterectomy  10/1983  . SHOULDER SURGERY Right    reverse total arthroplasty w/ biceps tenodesis  . TONSILLECTOMY AND ADENOIDECTOMY    . TOTAL HIP ARTHROPLASTY Right 04/2007  . WISDOM TOOTH EXTRACTION      There were no vitals filed for this visit.   Subjective Assessment - 05/11/20 1333    Subjective  [name] presents for OT Rx visit  22/36 to address BLE lymphedema. Pt reports soreness in knee. She denies LE related pain.    Pertinent History PMHx extensive; Dx contributing to leg swelling include OA, B knee and R hip arthroplasties, asthma, HTN. Suspect LE venous insufficiency.    Limitations chronic pain, chronic leg swelling, generalized weakness, kyphotic posture    Repetition Increases Symptoms    Special Tests + stemmer sign bilaterally base of toes  Patient Stated Goals Reduce LE edema    Pain Onset --   30 yrs                       OT Treatments/Exercises (OP) - 05/11/20 0001      ADLs   ADL Education Given Yes      Manual Therapy   Manual Therapy Edema management;Manual Lymphatic Drainage (MLD);Compression Bandaging    Manual Lymphatic Drainage (MLD) MLD to LLE as established    Compression Bandaging LLE short stretch knee length wraps as established                  OT Education - 05/11/20 1335    Education Details Continued skilled Pt/caregiver education  And LE ADL training throughout visit for lymphedema self care/ home program,  including compression wrapping, compression garment and device wear/care, lymphatic pumping ther ex, simple self-MLD, and skin care. Discussed progress towards goals.    Person(s) Educated Patient    Methods Explanation;Demonstration;Handout    Comprehension Verbalized understanding;Returned demonstration               OT Long Term Goals - 03/28/20 1421      OT LONG TERM GOAL #1   Title With modified independent (extra time) Pt will be able to apply knee length, multi-layer, short stretch compression wraps daily from ankle to tibial tuberosity on one leg at a time using correct gradient techniques to return affected limb/s, as closely as possible, to premorbid size and shape, to limit leg pain and infection risk, and to improve safe functional mobility and ADLs performance.    Baseline Dependent    Time 4    Period Days    Status Achieved   achieved 4th visit     OT LONG TERM GOAL #2   Title Pt will be able to verbalize signs and symptoms of cellulitis infection and identify lymphedema precautions using printed resource (modified independence) for reference to decrease infection risk and limit LE progression over time.    Baseline Max A    Time 4    Period Days    Status Achieved      OT LONG TERM GOAL #3   Title Pt will sustain a least 85% compliance with all daily LE self-care home program components throughout Intensive Phase CDT, including impeccable skin care, lymphatic pumping ther ex,  compression wraps and simple self MLD, to ensure optimal limb volume reduction, to limit infection risk and to limit LE progression.    Baseline dependent    Time 12    Period Weeks    Status Achieved   "I'm 98% !"     OT LONG TERM GOAL #4   Title Using assistive devices (modified independence)  Pt will be able to don and doff appropriate daytime compression garments to limit edema re-accumulation and further progression by issue date.    Baseline Max A    Time 12    Period Weeks     Status On-going      OT LONG TERM GOAL #5   Title After skilled teaching Pt  will be able to perform all lymphedema self-care home program components with modified independence (extra time, assistive devices PRN) , including simple self MLD, lymphatic pumping exercises, don, doff and wear compression garments  daily, sustain   impeccable skin care daily, to limit lymphedema progression and infection risk.    Baseline Max A    Time 12  Period Weeks    Status On-going   Pt is trained for comrpession wrapping skin care and ther ex. She is still working on learning simple self MLD as of 03/21/20     OT LONG TERM GOAL #6   Title Pt will increase score on FOTO 10 points, from 45 to 55/100, to improve functional performance of basic ADLs.    Baseline Max A    Time 12    Period Weeks    Status On-going                 Plan - 05/11/20 1407    Clinical Impression Statement Pt tolerated MLD, skin care and gradient compression wrap to LLE with no increased pain. Pt continues to wear knee brace on R knee. Fit "remake" L custom compression garment as soon as available and commence cdt to RLE.            OT Occupational Profile and History Comprehensive Assessment- Review of records and extensive additional review of physical, cognitive, psychosocial history related to current functional performance   Occupational performance deficits (Please refer to evaluation for details): ADL's;IADL's;Social Participation;Work;Leisure;Other   body image  Body Structure / Function / Physical Skills ADL;Decreased knowledge of precautions;ROM;Balance;Decreased knowledge of use of DME;Mobility;Edema;Skin integrity;Pain;IADL   Rehab Potential Good   Clinical Decision Making Several treatment options, min-mod task modification necessary   Comorbidities Affecting Occupational Performance: Presence of comorbidities impacting occupational performance   Modification or Assistance to Complete Evaluation   Min-Moderate modification of tasks or assist with assess necessary to complete eval   OT Frequency 2x / week   OT Duration 12 weeks   and PRN  OT Treatment/Interventions Self-care/ADL training;Therapeutic exercise;Manual lymph drainage;Compression bandaging;Patient/family education;Other (comment);Therapeutic activities;DME and/or AE instruction;Manual Therapy   Plan Intensive Phase CDT: 1 leg at a time to limit fall risk. MLD, skin care, ther ex , cimpression wraps -> daytime garments, HOS device, consider Flexitouch if appropriate   Recommended Other Services Fit w/ appropriate compression garments that are effective, comfortable and  Pt can don using AE and extra time   Consulted and Agree with Plan of Care Patient        Patient will benefit from skilled therapeutic intervention in order to improve the following deficits and impairments:           Visit Diagnosis: Lymphedema, not elsewhere classified    Problem List Patient Active Problem List   Diagnosis Date Noted  . Nausea 03/31/2020  . Multiple thyroid nodules 12/28/2019  . Thyroid nodule 09/29/2019  . Lymphedema 11/10/2018  . PAD (peripheral artery disease) ([place]) 10/12/2018  . Aortic atherosclerosis ([place]) 10/12/2018  . Carotid stenosis, asymptomatic, bilateral 10/12/2018  . Positive colorectal cancer screening using Cologuard test 10/09/2018  . Gastroesophageal reflux disease   . Acute gastritis without hemorrhage   . Osteoarthritis 04/29/2018  . Hiatal hernia with GERD 04/29/2018  . Hyperlipidemia 04/29/2018  . Lymphedema of both lower extremities 04/29/2018  . Left knee pain 10/23/2016  . Cataracts, bilateral 07/31/2016  . Glaucoma 07/31/2016  . Lateral epicondylitis of left elbow 10/20/2015  . Vitamin D deficiency 10/20/2015  . Type 2 diabetes, controlled, with neuropathy ([place]) 10/16/2015  . Gouty arthritis 06/03/2015  . H/O syncope 06/03/2015  . Mild intermittent asthma 06/03/2015  . Multiple gastric  ulcers 06/03/2015  . Schatzki's ring 06/03/2015  . Undifferentiated connective tissue disease ([place]) 06/03/2015  . Bone disorder 04/05/2015  . Dental injury 04/05/2015  . Pericarditis 04/05/2015  . Sjogren's  syndrome ([place]) 09/07/2014  . Elevated alkaline phosphatase level 08/24/2014  . Lactose intolerance 08/24/2014  . Obesity 08/24/2014  . Peripheral edema 08/24/2014  . 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 Abdominal adhesions 08/02/2014  . 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[place]) 08/02/2014  . Degenerative joint disease (DJD) of lumbar spine 08/02/2014  . Diverticulosis of both small and large intestine 08/02/2014  . Hyperuricemia 08/02/2014  . Pure hypercholesterolemia 08/02/2014  . Trigger finger 08/02/2014  . Right shoulder pain 07/29/2014  . Benign neoplasm of colon 06/30/2010  . Right upper quadrant pain 06/30/2010    Andrey Spearman, MS, OTR/L, [facility] 05/11/20 3:01 PM   [place] MAIN [facility] SERVICES [address] [place], [place], [address] Phone: [phone]   Fax:  [phone]  Name: [name] MRN: [id number] Date of Birth: [birth year]

## 2020-05-12 ENCOUNTER — Ambulatory Visit: Payer: Medicare Other | Attending: Cardiovascular Disease | Admitting: Physical Therapy

## 2020-05-12 ENCOUNTER — Other Ambulatory Visit: Payer: Self-pay

## 2020-05-12 ENCOUNTER — Encounter: Payer: Self-pay | Admitting: Physical Therapy

## 2020-05-12 DIAGNOSIS — M25561 Pain in right knee: Secondary | ICD-10-CM | POA: Diagnosis present

## 2020-05-12 DIAGNOSIS — I890 Lymphedema, not elsewhere classified: Secondary | ICD-10-CM | POA: Diagnosis present

## 2020-05-12 DIAGNOSIS — M6281 Muscle weakness (generalized): Secondary | ICD-10-CM | POA: Diagnosis present

## 2020-05-12 DIAGNOSIS — M545 Low back pain, unspecified: Secondary | ICD-10-CM | POA: Insufficient documentation

## 2020-05-12 DIAGNOSIS — G8929 Other chronic pain: Secondary | ICD-10-CM | POA: Diagnosis present

## 2020-05-12 DIAGNOSIS — M25661 Stiffness of right knee, not elsewhere classified: Secondary | ICD-10-CM

## 2020-05-13 NOTE — Therapy (Signed)
[facility] [address] [place], [place], [address] Phone: [phone]   Fax:  [phone]  Physical Therapy Treatment  Patient Details  Name: [name] MRN: [id number] Date of Birth: [birth year] Referring Provider (PT): Dr. Harlow Mares   Encounter Date: 05/12/2020   PT End of Session - 05/13/20 1545    Visit Number 26    Number of Visits 38    Date for PT Re-Evaluation 06/16/20    Authorization - Visit Number 4    Authorization - Number of Visits 10    PT Start Time 7253    PT Stop Time 6644    PT Time Calculation (min) 49 min    Activity Tolerance Patient tolerated treatment well    Behavior During Therapy [facility] for tasks assessed/performed           Past Medical History:  Diagnosis Date  . Anterolisthesis    Cervical spine  . Asthma   . Connective tissue disorder (HCC)    recurrent carotid arteritis, temporal arteritis, vasculitis mandible, general hepatitis, avascular necrosis bilat  . DDD (degenerative disc disease), lumbar   . Diabetes mellitus without complication ([place])   . Diverticulosis   . Duodenitis   . Gouty arthritis   . Hiatal hernia with GERD   . History of cardiovascular stress test    a. 07/2018 [facility]): Fixed inferoapical defect w/ nl contraction-->attenuation artifact. No ischemia. EF 63%.  . Hyperlipidemia   . Hypertension    a. 02/2019 Renal artery duplex: no evidence of prox RAS.  Marland Kitchen Hyperuricemia   . Keratitis sicca, bilateral ([place])   . Lymphedema of both lower extremities   . Osteoarthritis   . Pericarditis    Recurrent: Aug 97, July 05, Sept 08, July 16, July 18  . PUD (peptic ulcer disease)   . 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[place])   . Sjogren's syndrome ([place])   . Syncope   . Tendonitis, Achilles, right   . Tortuous colon   . Trigger finger     Past Surgical History:  Procedure Laterality Date  . ABDOMINAL HYSTERECTOMY    . BREAST BIOPSY    . BREAST EXCISIONAL BIOPSY Left 1986   neg   . CHOLECYSTECTOMY    . COLONOSCOPY WITH PROPOFOL N/A 08/26/2018   Procedure: COLONOSCOPY WITH PROPOFOL;  Surgeon: Lucilla Lame, MD;  Location: [facility] ENDOSCOPY;  Service: Endoscopy;  Laterality: N/A;  . CYSTOSCOPY  02/26/2018   [name], MD   . distal arthrectomy    . ESOPHAGOGASTRODUODENOSCOPY (EGD) WITH PROPOFOL N/A 08/26/2018   Procedure: ESOPHAGOGASTRODUODENOSCOPY (EGD) WITH PROPOFOL;  Surgeon: Lucilla Lame, MD;  Location: [facility] ENDOSCOPY;  Service: Endoscopy;  Laterality: N/A;  . EXCISION NEUROMA    . KNEE SURGERY Bilateral    1985, 2001, 11/2002, 12/2006  . panhysterectomy  10/1983  . SHOULDER SURGERY Right    reverse total arthroplasty w/ biceps tenodesis  . TONSILLECTOMY AND ADENOIDECTOMY    . TOTAL HIP ARTHROPLASTY Right 04/2007  . WISDOM TOOTH EXTRACTION      There were no vitals filed for this visit.   Subjective Assessment - 05/12/20 1351    Subjective BP: 141/42.  HR: 60.  Pt. feels a little lightheaded while walking into PT clinic.  Diastolic pressure increased to 66 after Nustep.    Pertinent History Pt. from [place] and came to [place] to take care of friend and then Covid happened.  Pt. retired.  Pts. ex-husband lives in her house in [place].  R shoulder limitations (5# lifting restrictions).  Pt. went to [employer] for 10 years (every week) with benefits reported in low back.    Limitations Standing;Walking;Writing    Patient Stated Goals Decrease back pain/ improve strength/ mobility.    Currently in Pain? Yes    Pain Score 2     Pain Location Knee    Pain Orientation Medial;Right    Pain Descriptors / Indicators Sore    Pain Onset --   30 yrs             There.ex.:  Nustep L2-3 10 min. B LE only (discussed activities for the week)  Standing partial squats (limited knee flexion/ slight discomfort in R medial knee)- 20x Standing hip ex. (abduction/ extension/ flexion)- at //-bars with mirror feedback Seated LAQ/ marching/ alt. UE and LE 20x.  Good sequencing Walking in PT  clinic with focus on step pattern/ heel strike and toe off.  Pt. Cued on arm swing/ upright posture.      PT Long Term Goals - 04/21/20 1631      PT LONG TERM GOAL #1   Title Pt. will improve FOTO to predicted score to demonstrate improvement in pain free functional mobility.    Baseline 11/11: TBD    Time 8    Period Weeks    Status New    Target Date 06/16/20      PT LONG TERM GOAL #2   Title Pt. will improve R knee flexion AROM to at least 120 deg to improve stair climbing ability without compensations.    Baseline 11/11: R knee flex AROM: 105 deg    Time 8    Period Weeks    Status New    Target Date 06/16/20      PT LONG TERM GOAL #3   Title Pt. will improve R knee flexion strength to 5/5 without pain to improve pain free functional mobility.    Baseline 11/11: R knee flex: 4+/5 with pain    Time 8    Period Weeks    Status New    Target Date 06/16/20                 Plan - 05/13/20 1546    Clinical Impression Statement No c/o back pain but 2/10 R medial knee pain with increase standing/ walking tasks during tx. session.  Pt. ambulates around clinic with more consistent gait patern but limited knee flexion during squats at //-bars.  Good LE muscle endurance on Nustep with no UE assist. Pt. instructed in HEP and importance of maintaining daily activity.    Personal Factors and Comorbidities Education;Comorbidity 3+    Comorbidities AAA, AVN, HTN, severe LE edema bilat (L>R)    Examination-Activity Limitations Locomotion Level;Squat;Stairs;Stand    Examination-Participation Restrictions Yard Work;Driving;Community Activity    Stability/Clinical Decision Making Evolving/Moderate complexity    Rehab Potential Fair    PT Frequency 2x / week    PT Duration 8 weeks    PT Treatment/Interventions ADLs/Self Care Home Management;Electrical Stimulation;Moist Heat;Gait training;Stair training;Functional mobility training;Neuromuscular re-education;Balance training;Therapeutic  exercise;Therapeutic activities;Patient/family education;Manual techniques;Passive range of motion    PT Next Visit Plan R knee AROM/ strength reassessment.    Consulted and Agree with Plan of Care Patient           Patient will benefit from skilled therapeutic intervention in order to improve the following deficits and impairments:  Abnormal gait, Pain, Improper body mechanics, Postural dysfunction, Decreased mobility, Decreased activity tolerance, Decreased endurance, Decreased  range of motion, Decreased strength, Impaired UE functional use, Impaired flexibility, Difficulty walking  Visit Diagnosis: Muscle weakness (generalized)  Right knee pain, unspecified chronicity  Decreased range of motion of right knee  Chronic bilateral low back pain without sciatica     Problem List Patient Active Problem List   Diagnosis Date Noted  . Nausea 03/31/2020  . Multiple thyroid nodules 12/28/2019  . Thyroid nodule 09/29/2019  . Lymphedema 11/10/2018  . PAD (peripheral artery disease) ([place]) 10/12/2018  . Aortic atherosclerosis ([place]) 10/12/2018  . Carotid stenosis, asymptomatic, bilateral 10/12/2018  . Positive colorectal cancer screening using Cologuard test 10/09/2018  . Gastroesophageal reflux disease   . Acute gastritis without hemorrhage   . Osteoarthritis 04/29/2018  . Hiatal hernia with GERD 04/29/2018  . Hyperlipidemia 04/29/2018  . Lymphedema of both lower extremities 04/29/2018  . Left knee pain 10/23/2016  . Cataracts, bilateral 07/31/2016  . Glaucoma 07/31/2016  . Lateral epicondylitis of left elbow 10/20/2015  . Vitamin D deficiency 10/20/2015  . Type 2 diabetes, controlled, with neuropathy ([place]) 10/16/2015  . Gouty arthritis 06/03/2015  . H/O syncope 06/03/2015  . Mild intermittent asthma 06/03/2015  . Multiple gastric ulcers 06/03/2015  . Schatzki's ring 06/03/2015  . Undifferentiated connective tissue disease ([place]) 06/03/2015  . Bone disorder 04/05/2015  .  Dental injury 04/05/2015  . Pericarditis 04/05/2015  . Sjogren's syndrome ([place]) 09/07/2014  . Elevated alkaline phosphatase level 08/24/2014  . Lactose intolerance 08/24/2014  . Obesity 08/24/2014  . Peripheral edema 08/24/2014  . 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 Abdominal adhesions 08/02/2014  . 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[place]) 08/02/2014  . Degenerative joint disease (DJD) of lumbar spine 08/02/2014  . Diverticulosis of both small and large intestine 08/02/2014  . Hyperuricemia 08/02/2014  . Pure hypercholesterolemia 08/02/2014  . Trigger finger 08/02/2014  . Right shoulder pain 07/29/2014  . Benign neoplasm of colon 06/30/2010  . Right upper quadrant pain 06/30/2010   Pura Spice, PT, DPT # [phone] 05/13/2020, 3:56 PM  [place] [facility] [address] [place], [place], [address] Phone: [phone]   Fax:  [phone]  Name: [name] MRN: [id number] Date of Birth: [birth year]

## 2020-05-16 ENCOUNTER — Other Ambulatory Visit: Payer: Self-pay

## 2020-05-16 ENCOUNTER — Ambulatory Visit: Payer: Medicare Other | Admitting: Occupational Therapy

## 2020-05-16 DIAGNOSIS — I890 Lymphedema, not elsewhere classified: Secondary | ICD-10-CM | POA: Diagnosis not present

## 2020-05-16 NOTE — Therapy (Addendum)
[place] [facility]IN [facility] SERVICES [address] [place], [place], [address] Phone: [phone]   Fax:  [phone]  Occupational Therapy Treatment  Patient Details  Name: [name] MRN: [id number] Date of Birth: 1945/10/17 Referring Provider (OT): Ida Rogue, MD   Encounter Date: 05/16/2020   OT End of Session - 05/16/20 1454    Visit Number 23   Number of Visits 36    Date for OT Re-Evaluation 08/07/20    OT Start Time 0207    OT Stop Time 0300    OT Time Calculation (min) 53 min    Activity Tolerance Patient tolerated treatment well;No increased pain    Behavior During Therapy WFL for tasks assessed/performed           Past Medical History:  Diagnosis Date  . Anterolisthesis    Cervical spine  . Asthma   . Connective tissue disorder (HCC)    recurrent carotid arteritis, temporal arteritis, vasculitis mandible, general hepatitis, avascular necrosis bilat  . DDD (degenerative disc disease), lumbar   . Diabetes mellitus without complication ([place])   . Diverticulosis   . Duodenitis   . Gouty arthritis   . Hiatal hernia with GERD   . History of cardiovascular stress test    a. 07/2018 [facility]): Fixed inferoapical defect w/ nl contraction-->attenuation artifact. No ischemia. EF 63%.  . Hyperlipidemia   . Hypertension    a. 02/2019 Renal artery duplex: no evidence of prox RAS.  Marland Kitchen Hyperuricemia   . Keratitis sicca, bilateral ([place])   . Lymphedema of both lower extremities   . Osteoarthritis   . Pericarditis    Recurrent: Aug 97, July 05, Sept 08, July 16, July 18  . PUD (peptic ulcer disease)   . 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 Sjogren's syndrome ([place])   . Syncope   . Tendonitis, Achilles, right   . Tortuous colon   . Trigger finger     Past Surgical History:  Procedure Laterality Date  . ABDOMINAL HYSTERECTOMY    . BREAST BIOPSY    . BREAST EXCISIONAL BIOPSY Left 1986   neg  . CHOLECYSTECTOMY    .  COLONOSCOPY WITH PROPOFOL N/A 08/26/2018   Procedure: COLONOSCOPY WITH PROPOFOL;  Surgeon: Lucilla Lame, MD;  Location: [facility] ENDOSCOPY;  Service: Endoscopy;  Laterality: N/A;  . CYSTOSCOPY  02/26/2018   [name], MD   . distal arthrectomy    . ESOPHAGOGASTRODUODENOSCOPY (EGD) WITH PROPOFOL N/A 08/26/2018   Procedure: ESOPHAGOGASTRODUODENOSCOPY (EGD) WITH PROPOFOL;  Surgeon: Lucilla Lame, MD;  Location: [facility] ENDOSCOPY;  Service: Endoscopy;  Laterality: N/A;  . EXCISION NEUROMA    . KNEE SURGERY Bilateral    1985, 2001, 11/2002, 12/2006  . panhysterectomy  10/1983  . SHOULDER SURGERY Right    reverse total arthroplasty w/ biceps tenodesis  . TONSILLECTOMY AND ADENOIDECTOMY    . TOTAL HIP ARTHROPLASTY Right 04/2007  . WISDOM TOOTH EXTRACTION      There were no vitals filed for this visit.   Subjective Assessment - 05/16/20 1409    Subjective  [name] presents for OT Rx visit  23/36 to address BLE lymphedema. Pt presents with gradient compression wraps in place on LLE. Pt denies LE related pain this afternoon.    Pertinent History PMHx extensive; Dx contributing to leg swelling include OA, B knee and R hip arthroplasties, asthma, HTN. Suspect LE venous insufficiency.    Limitations chronic pain, chronic leg swelling, generalized weakness, kyphotic posture  Repetition Increases Symptoms    Special Tests + stemmer sign bilaterally base of toes    Patient Stated Goals Reduce LE edema    Pain Onset --   30 yrs                       OT Treatments/Exercises (OP) - 05/16/20 0001      ADLs   ADL Education Given Yes      Manual Therapy   Manual Therapy Edema management;Manual Lymphatic Drainage (MLD)    Manual Lymphatic Drainage (MLD) MLD to LLE as established    Compression Bandaging LLE short stretch knee length wraps as established                  OT Education - 05/16/20 1411    Education Details Continued skilled Pt/caregiver education  And LE ADL  training throughout visit for lymphedema self care/ home program, including compression wrapping, compression garment and device wear/care, lymphatic pumping ther ex, simple self-MLD, and skin care. Discussed progress towards goals.    Person(s) Educated Patient    Methods Explanation;Demonstration;Handout    Comprehension Verbalized understanding;Returned demonstration               OT Long Term Goals - 03/28/20 1421      OT LONG TERM GOAL #1   Title With modified independent (extra time) Pt will be able to apply knee length, multi-layer, short stretch compression wraps daily from ankle to tibial tuberosity on one leg at a time using correct gradient techniques to return affected limb/s, as closely as possible, to premorbid size and shape, to limit leg pain and infection risk, and to improve safe functional mobility and ADLs performance.    Baseline Dependent    Time 4    Period Days    Status Achieved   achieved 4th visit     OT LONG TERM GOAL #2   Title Pt will be able to verbalize signs and symptoms of cellulitis infection and identify lymphedema precautions using printed resource (modified independence) for reference to decrease infection risk and limit LE progression over time.    Baseline Max A    Time 4    Period Days    Status Achieved      OT LONG TERM GOAL #3   Title Pt will sustain a least 85% compliance with all daily LE self-care home program components throughout Intensive Phase CDT, including impeccable skin care, lymphatic pumping ther ex,  compression wraps and simple self MLD, to ensure optimal limb volume reduction, to limit infection risk and to limit LE progression.    Baseline dependent    Time 12    Period Weeks    Status Achieved   "I'm 98% !"     OT LONG TERM GOAL #4   Title Using assistive devices (modified independence)  Pt will be able to don and doff appropriate daytime compression garments to limit edema re-accumulation and further progression by  issue date.    Baseline Max A    Time 12    Period Weeks    Status On-going      OT LONG TERM GOAL #5   Title After skilled teaching Pt  will be able to perform all lymphedema self-care home program components with modified independence (extra time, assistive devices PRN) , including simple self MLD, lymphatic pumping exercises, don, doff and wear compression garments  daily, sustain   impeccable skin care daily, to limit lymphedema progression  and infection risk.    Baseline Max A    Time 12    Period Weeks    Status On-going   Pt is trained for comrpession wrapping skin care and ther ex. She is still working on learning simple self MLD as of 03/21/20     OT LONG TERM GOAL #6   Title Pt will increase score on FOTO 10 points, from 45 to 55/100, to improve functional performance of basic ADLs.    Baseline Max A    Time 12    Period Weeks    Status On-going                 Plan - 05/16/20 1456    Clinical Impression Statement Continued CDT to LLE. Pt had no difficulty tolerating MLD, skin care and compression therapy today. LLE was more swollen than has been typical throughout Rx course. Pt states she was in compression wraps over visit interval. Fluid retention may be impacting limb condition. Cont as per POC. Awaiiting deivery of custon LLE compression knee high. Will fit garment once delivered and  then commence CDT to RLE.          OT Occupational Profile and History Comprehensive Assessment- Review of records and extensive additional review of physical, cognitive, psychosocial history related to current functional performance   Occupational performance deficits (Please refer to evaluation for details): ADL's;IADL's;Social Participation;Work;Leisure;Other   body image  Body Structure / Function / Physical Skills ADL;Decreased knowledge of precautions;ROM;Balance;Decreased knowledge of use of DME;Mobility;Edema;Skin integrity;Pain;IADL   Rehab Potential Good   Clinical  Decision Making Several treatment options, min-mod task modification necessary   Comorbidities Affecting Occupational Performance: Presence of comorbidities impacting occupational performance   Modification or Assistance to Complete Evaluation  Min-Moderate modification of tasks or assist with assess necessary to complete eval   OT Frequency 2x / week   OT Duration 12 weeks   and PRN  OT Treatment/Interventions Self-care/ADL training;Therapeutic exercise;Manual lymph drainage;Compression bandaging;Patient/family education;Other (comment);Therapeutic activities;DME and/or AE instruction;Manual Therapy   Plan Intensive Phase CDT: 1 leg at a time to limit fall risk. MLD, skin care, ther ex , cimpression wraps -> daytime garments, HOS device, consider Flexitouch if appropriate   Recommended Other Services Fit w/ appropriate compression garments that are effective, comfortable and  Pt can don using AE and extra time   Consulted and Agree with Plan of Care Patient        Patient will benefit from skilled therapeutic intervention in order to improve the following deficits and impairments:           Visit Diagnosis: Lymphedema, not elsewhere classified    Problem List Patient Active Problem List   Diagnosis Date Noted  . Nausea 03/31/2020  . Multiple thyroid nodules 12/28/2019  . Thyroid nodule 09/29/2019  . Lymphedema 11/10/2018  . PAD (peripheral artery disease) ([place]) 10/12/2018  . Aortic atherosclerosis ([place]) 10/12/2018  . Carotid stenosis, asymptomatic, bilateral 10/12/2018  . Positive colorectal cancer screening using Cologuard test 10/09/2018  . Gastroesophageal reflux disease   . Acute gastritis without hemorrhage   . Osteoarthritis 04/29/2018  . Hiatal hernia with GERD 04/29/2018  . Hyperlipidemia 04/29/2018  . Lymphedema of both lower extremities 04/29/2018  . Left knee pain 10/23/2016  . Cataracts, bilateral 07/31/2016  . Glaucoma 07/31/2016  . Lateral epicondylitis of  left elbow 10/20/2015  . Vitamin D deficiency 10/20/2015  . Type 2 diabetes, controlled, with neuropathy ([place]) 10/16/2015  . Gouty arthritis 06/03/2015  . H/O  syncope 06/03/2015  . Mild intermittent asthma 06/03/2015  . Multiple gastric ulcers 06/03/2015  . Schatzki's ring 06/03/2015  . Undifferentiated connective tissue disease ([place]) 06/03/2015  . Bone disorder 04/05/2015  . Dental injury 04/05/2015  . Pericarditis 04/05/2015  . Sjogren's syndrome ([place]) 09/07/2014  . Elevated alkaline phosphatase level 08/24/2014  . Lactose intolerance 08/24/2014  . Obesity 08/24/2014  . Peripheral edema 08/24/2014  . 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 Abdominal adhesions 08/02/2014  . 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[place]) 08/02/2014  . Degenerative joint disease (DJD) of lumbar spine 08/02/2014  . Diverticulosis of both small and large intestine 08/02/2014  . Hyperuricemia 08/02/2014  . Pure hypercholesterolemia 08/02/2014  . Trigger finger 08/02/2014  . Right shoulder pain 07/29/2014  . Benign neoplasm of colon 06/30/2010  . Right upper quadrant pain 06/30/2010    Andrey Spearman, MS, OTR/L, [facility] 05/16/20 3:05 PM  [place] MAIN [facility] SERVICES [address] [place], [place], [address] Phone: [phone]   Fax:  [phone]  Name: Flora Parks MRN: [id number] Date of Birth: [birth year]

## 2020-05-17 ENCOUNTER — Other Ambulatory Visit: Payer: Self-pay

## 2020-05-17 ENCOUNTER — Ambulatory Visit: Payer: Medicare Other | Admitting: Physical Therapy

## 2020-05-17 DIAGNOSIS — M25661 Stiffness of right knee, not elsewhere classified: Secondary | ICD-10-CM

## 2020-05-17 DIAGNOSIS — M6281 Muscle weakness (generalized): Secondary | ICD-10-CM

## 2020-05-17 DIAGNOSIS — I890 Lymphedema, not elsewhere classified: Secondary | ICD-10-CM

## 2020-05-17 DIAGNOSIS — G8929 Other chronic pain: Secondary | ICD-10-CM

## 2020-05-17 DIAGNOSIS — M25561 Pain in right knee: Secondary | ICD-10-CM

## 2020-05-18 ENCOUNTER — Ambulatory Visit: Payer: Medicare Other | Admitting: Occupational Therapy

## 2020-05-19 ENCOUNTER — Encounter: Payer: Self-pay | Admitting: Physical Therapy

## 2020-05-19 ENCOUNTER — Ambulatory Visit: Payer: Medicare Other | Admitting: Physical Therapy

## 2020-05-19 ENCOUNTER — Other Ambulatory Visit: Payer: Self-pay

## 2020-05-19 DIAGNOSIS — M25661 Stiffness of right knee, not elsewhere classified: Secondary | ICD-10-CM

## 2020-05-19 DIAGNOSIS — G8929 Other chronic pain: Secondary | ICD-10-CM

## 2020-05-19 DIAGNOSIS — M6281 Muscle weakness (generalized): Secondary | ICD-10-CM

## 2020-05-19 DIAGNOSIS — M25561 Pain in right knee: Secondary | ICD-10-CM

## 2020-05-19 NOTE — Therapy (Signed)
[facility] [address] [place], [place], [address] Phone: [phone]   Fax:  [phone]  Physical Therapy Treatment  Patient Details  Name: [name] MRN: [id number] Date of Birth: Mar 14, 1946 Referring Provider (PT): Dr. Harlow Mares   Encounter Date: 05/17/2020   PT End of Session - 05/19/20 0913    Visit Number 27    Number of Visits 38    Date for PT Re-Evaluation 06/16/20    Authorization - Visit Number 5    Authorization - Number of Visits 10    PT Start Time 2947    PT Stop Time 1404    PT Time Calculation (min) 48 min    Activity Tolerance Patient tolerated treatment well    Behavior During Therapy [facility] for tasks assessed/performed           Past Medical History:  Diagnosis Date  . Anterolisthesis    Cervical spine  . Asthma   . Connective tissue disorder (HCC)    recurrent carotid arteritis, temporal arteritis, vasculitis mandible, general hepatitis, avascular necrosis bilat  . DDD (degenerative disc disease), lumbar   . Diabetes mellitus without complication ([employer])   . Diverticulosis   . Duodenitis   . Gouty arthritis   . Hiatal hernia with GERD   . History of cardiovascular stress test    a. 07/2018 [facility]): Fixed inferoapical defect w/ nl contraction-->attenuation artifact. No ischemia. EF 63%.  . Hyperlipidemia   . Hypertension    a. 02/2019 Renal artery duplex: no evidence of prox RAS.  Marland Kitchen Hyperuricemia   . Keratitis sicca, bilateral ([place])   . Lymphedema of both lower extremities   . Osteoarthritis   . Pericarditis    Recurrent: Aug 97, July 05, Sept 08, July 16, July 18  . PUD (peptic ulcer disease)   . 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[place])   . Sjogren's syndrome ([place])   . Syncope   . Tendonitis, Achilles, right   . Tortuous colon   . Trigger finger     Past Surgical History:  Procedure Laterality Date  . ABDOMINAL HYSTERECTOMY    . BREAST BIOPSY    . BREAST EXCISIONAL BIOPSY Left 1986   neg   . CHOLECYSTECTOMY    . COLONOSCOPY WITH PROPOFOL N/A 08/26/2018   Procedure: COLONOSCOPY WITH PROPOFOL;  Surgeon: Lucilla Lame, MD;  Location: [facility] ENDOSCOPY;  Service: Endoscopy;  Laterality: N/A;  . CYSTOSCOPY  02/26/2018   [name], MD   . distal arthrectomy    . ESOPHAGOGASTRODUODENOSCOPY (EGD) WITH PROPOFOL N/A 08/26/2018   Procedure: ESOPHAGOGASTRODUODENOSCOPY (EGD) WITH PROPOFOL;  Surgeon: Lucilla Lame, MD;  Location: [facility] ENDOSCOPY;  Service: Endoscopy;  Laterality: N/A;  . EXCISION NEUROMA    . KNEE SURGERY Bilateral    1985, 2001, 11/2002, 12/2006  . panhysterectomy  10/1983  . SHOULDER SURGERY Right    reverse total arthroplasty w/ biceps tenodesis  . TONSILLECTOMY AND ADENOIDECTOMY    . TOTAL HIP ARTHROPLASTY Right 04/2007  . WISDOM TOOTH EXTRACTION      There were no vitals filed for this visit.   Subjective Assessment - 05/19/20 0910    Subjective BP: 163/60.  HR: 60.  Pt. entered PT with shoes on today (no walking shoe or lymphadema wrap).  Pt. states back is stiff today.    Pertinent History Pt. from [place] and came to [place] to take care of friend and then Covid happened.  Pt. retired.  Pts. ex-husband lives in her house in  [place].  R shoulder limitations (5# lifting restrictions).  Pt. went to [facility] [employer] for 10 years (every week) with benefits reported in low back.    Limitations Standing;Walking;Writing    Patient Stated Goals Decrease back pain/ improve strength/ mobility.    Currently in Pain? Yes    Pain Score 2     Pain Location Knee    Pain Orientation Right;Medial    Pain Onset --   30 yrs            There.ex.:  Nustep L2-3 10 min. B LE only (discussed activities for the week)  Standing hip ex. (abduction/ extension/ flexion)- at //-bars with mirror feedback Seated LAQ/ marching/ alt. UE and LE 20x.  No increase c/o pain.   Walking in PT clinic with focus on step pattern/ heel strike and toe off.  Pt. Cued on arm swing/ upright posture.  Supine LE/lumbar  generalized stretches (12 min.). Supine bolster bridging 20x/ marching 15x/ SAQ with holds 20x.      PT Long Term Goals - 04/21/20 1631      PT LONG TERM GOAL #1   Title Pt. will improve FOTO to predicted score to demonstrate improvement in pain free functional mobility.    Baseline 11/11: TBD    Time 8    Period Weeks    Status New    Target Date 06/16/20      PT LONG TERM GOAL #2   Title Pt. will improve R knee flexion AROM to at least 120 deg to improve stair climbing ability without compensations.    Baseline 11/11: R knee flex AROM: 105 deg    Time 8    Period Weeks    Status New    Target Date 06/16/20      PT LONG TERM GOAL #3   Title Pt. will improve R knee flexion strength to 5/5 without pain to improve pain free functional mobility.    Baseline 11/11: R knee flex: 4+/5 with pain    Time 8    Period Weeks    Status New    Target Date 06/16/20                 Plan - 05/19/20 0914    Clinical Impression Statement Pt. did well during tx. session with no increase c/o pain reported.  Pt. completes supine LE ther.ex. with good quad muscle control in available range.  Pt. continues to present with significant B LE/ lower leg/ foot edema.  Pt. ambulates with improved gait pattern while wearing shoes today as compared to past several weeks.  No pain reported during Nustep/ consistent cadence.  Pt. benefits from use of ice, not heat to low back.    Personal Factors and Comorbidities Education;Comorbidity 3+    Comorbidities AAA, AVN, HTN, severe LE edema bilat (L>R)    Examination-Activity Limitations Locomotion Level;Squat;Stairs;Stand    Examination-Participation Restrictions Yard Work;Driving;Community Activity    Stability/Clinical Decision Making Evolving/Moderate complexity    Clinical Decision Making Moderate    Rehab Potential Fair    PT Frequency 2x / week    PT Duration 8 weeks    PT Treatment/Interventions ADLs/Self Care Home Management;Electrical  Stimulation;Moist Heat;Gait training;Stair training;Functional mobility training;Neuromuscular re-education;Balance training;Therapeutic exercise;Therapeutic activities;Patient/family education;Manual techniques;Passive range of motion    PT Next Visit Plan R knee AROM/ strength reassessment.    Consulted and Agree with Plan of Care Patient           Patient will benefit from skilled therapeutic  intervention in order to improve the following deficits and impairments:  Abnormal gait,Pain,Improper body mechanics,Postural dysfunction,Decreased mobility,Decreased activity tolerance,Decreased endurance,Decreased range of motion,Decreased strength,Impaired UE functional use,Impaired flexibility,Difficulty walking  Visit Diagnosis: Lymphedema, not elsewhere classified  Muscle weakness (generalized)  Right knee pain, unspecified chronicity  Decreased range of motion of right knee  Chronic bilateral low back pain without sciatica     Problem List Patient Active Problem List   Diagnosis Date Noted  . Nausea 03/31/2020  . Multiple thyroid nodules 12/28/2019  . Thyroid nodule 09/29/2019  . Lymphedema 11/10/2018  . PAD (peripheral artery disease) ([place]) 10/12/2018  . Aortic atherosclerosis ([place]) 10/12/2018  . Carotid stenosis, asymptomatic, bilateral 10/12/2018  . Positive colorectal cancer screening using Cologuard test 10/09/2018  . Gastroesophageal reflux disease   . Acute gastritis without hemorrhage   . Osteoarthritis 04/29/2018  . Hiatal hernia with GERD 04/29/2018  . Hyperlipidemia 04/29/2018  . Lymphedema of both lower extremities 04/29/2018  . Left knee pain 10/23/2016  . Cataracts, bilateral 07/31/2016  . Glaucoma 07/31/2016  . Lateral epicondylitis of left elbow 10/20/2015  . Vitamin D deficiency 10/20/2015  . Type 2 diabetes, controlled, with neuropathy ([place]) 10/16/2015  . Gouty arthritis 06/03/2015  . H/O syncope 06/03/2015  . Mild intermittent asthma 06/03/2015  .  Multiple gastric ulcers 06/03/2015  . Schatzki's ring 06/03/2015  . Undifferentiated connective tissue disease ([place]) 06/03/2015  . Bone disorder 04/05/2015  . Dental injury 04/05/2015  . Pericarditis 04/05/2015  . Sjogren's syndrome ([place]) 09/07/2014  . Elevated alkaline phosphatase level 08/24/2014  . Lactose intolerance 08/24/2014  . Obesity 08/24/2014  . Peripheral edema 08/24/2014  . 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 Abdominal adhesions 08/02/2014  . 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[place]) 08/02/2014  . Degenerative joint disease (DJD) of lumbar spine 08/02/2014  . Diverticulosis of both small and large intestine 08/02/2014  . Hyperuricemia 08/02/2014  . Pure hypercholesterolemia 08/02/2014  . Trigger finger 08/02/2014  . Right shoulder pain 07/29/2014  . Benign neoplasm of colon 06/30/2010  . Right upper quadrant pain 06/30/2010   Pura Spice, PT, DPT # [id number] 05/19/2020, 9:33 AM  [facility] [address] [place], [place], [address] Phone: [phone]   Fax:  [phone]  Name: [name] MRN: [id number] Date of Birth: 01/20/1946

## 2020-05-22 ENCOUNTER — Encounter: Payer: Self-pay | Admitting: Physical Therapy

## 2020-05-22 NOTE — Therapy (Signed)
[facility] [address] [place], [place], [address] Phone: [phone]   Fax:  [phone]  Physical Therapy Treatment  Patient Details  Name: [name] MRN: [id number] Date of Birth: 06/17/1945 Referring Provider (PT): Dr. Harlow Mares   Encounter Date: 05/19/2020   PT End of Session - 05/22/20 2048    Visit Number 28    Number of Visits 38    Date for PT Re-Evaluation 06/16/20    Authorization - Visit Number 6    Authorization - Number of Visits 10    PT Start Time 3474    PT Stop Time 1521    PT Time Calculation (min) 49 min    Activity Tolerance Patient tolerated treatment well    Behavior During Therapy [facility] for tasks assessed/performed           Past Medical History:  Diagnosis Date  . Anterolisthesis    Cervical spine  . Asthma   . Connective tissue disorder (HCC)    recurrent carotid arteritis, temporal arteritis, vasculitis mandible, general hepatitis, avascular necrosis bilat  . DDD (degenerative disc disease), lumbar   . Diabetes mellitus without complication ([place])   . Diverticulosis   . Duodenitis   . Gouty arthritis   . Hiatal hernia with GERD   . History of cardiovascular stress test    a. 07/2018 [facility]): Fixed inferoapical defect w/ nl contraction-->attenuation artifact. No ischemia. EF 63%.  . Hyperlipidemia   . Hypertension    a. 02/2019 Renal artery duplex: no evidence of prox RAS.  Marland Kitchen Hyperuricemia   . Keratitis sicca, bilateral ([place])   . Lymphedema of both lower extremities   . Osteoarthritis   . Pericarditis    Recurrent: Aug 97, July 05, Sept 08, July 16, July 18  . PUD (peptic ulcer disease)   . 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[place])   . Sjogren's syndrome ([place])   . Syncope   . Tendonitis, Achilles, right   . Tortuous colon   . Trigger finger     Past Surgical History:  Procedure Laterality Date  . ABDOMINAL HYSTERECTOMY    . BREAST BIOPSY    . BREAST EXCISIONAL BIOPSY Left 1986   neg   . CHOLECYSTECTOMY    . COLONOSCOPY WITH PROPOFOL N/A 08/26/2018   Procedure: COLONOSCOPY WITH PROPOFOL;  Surgeon: Lucilla Lame, MD;  Location: [facility] ENDOSCOPY;  Service: Endoscopy;  Laterality: N/A;  . CYSTOSCOPY  02/26/2018   [name], MD   . distal arthrectomy    . ESOPHAGOGASTRODUODENOSCOPY (EGD) WITH PROPOFOL N/A 08/26/2018   Procedure: ESOPHAGOGASTRODUODENOSCOPY (EGD) WITH PROPOFOL;  Surgeon: Lucilla Lame, MD;  Location: [facility] ENDOSCOPY;  Service: Endoscopy;  Laterality: N/A;  . EXCISION NEUROMA    . KNEE SURGERY Bilateral    1985, 2001, 11/2002, 12/2006  . panhysterectomy  10/1983  . SHOULDER SURGERY Right    reverse total arthroplasty w/ biceps tenodesis  . TONSILLECTOMY AND ADENOIDECTOMY    . TOTAL HIP ARTHROPLASTY Right 04/2007  . WISDOM TOOTH EXTRACTION      There were no vitals filed for this visit.   Subjective Assessment - 05/22/20 2032    Subjective BP: 163/45.  Diastolic increases to 60 after Nustep.  HR: 60.  Pt. reports no pain but generalized aches in back/neck.    Pertinent History Pt. from [place] and came to [place] to take care of friend and then Covid happened.  Pt. retired.  Pts. ex-husband lives in her house in [place].  R shoulder  limitations (5# lifting restrictions).  Pt. went to [employer] for 10 years (every week) with benefits reported in low back.    Limitations Standing;Walking;Writing    Patient Stated Goals Decrease back pain/ improve strength/ mobility.    Currently in Pain? Yes    Pain Onset --   30 yrs           There.ex.:  Nustep L3 10 min. B LE (consistent cadence)- discussed daily activity 2.5# seated/ standing LE therex./ Walking in //-bars with high marching/ lateral 4x each. Partial squats in pain tolerable range (limited by R knee pain)- modified  Walking in PT clinic with posture feedback/ consistent step pattern.   Reviewed HEP.      PT Long Term Goals - 04/21/20 1631      PT LONG TERM GOAL #1   Title Pt. will improve FOTO to predicted  score to demonstrate improvement in pain free functional mobility.    Baseline 11/11: TBD    Time 8    Period Weeks    Status New    Target Date 06/16/20      PT LONG TERM GOAL #2   Title Pt. will improve R knee flexion AROM to at least 120 deg to improve stair climbing ability without compensations.    Baseline 11/11: R knee flex AROM: 105 deg    Time 8    Period Weeks    Status New    Target Date 06/16/20      PT LONG TERM GOAL #3   Title Pt. will improve R knee flexion strength to 5/5 without pain to improve pain free functional mobility.    Baseline 11/11: R knee flex: 4+/5 with pain    Time 8    Period Weeks    Status New    Target Date 06/16/20                 Plan - 05/22/20 2049    Clinical Impression Statement Pt. worked hard during LE resisted therex.  No significant issues with BP today.  Pt. has generalized stiffness in neck/back/sh. reported during tx. session.  Good balance while walking around //-bar with use of 2.5# ankle wts.  Pt. returns to slightly antalgic gait pattern while wearing walking shoe/ compression wraps on L.  Good LE muscle endurance on Nustep with light resistance.    Personal Factors and Comorbidities Education;Comorbidity 3+    Comorbidities AAA, AVN, HTN, severe LE edema bilat (L>R)    Examination-Activity Limitations Locomotion Level;Squat;Stairs;Stand    Examination-Participation Restrictions Yard Work;Driving;Community Activity    Stability/Clinical Decision Making Evolving/Moderate complexity    Clinical Decision Making Moderate    Rehab Potential Fair    PT Frequency 2x / week    PT Duration 8 weeks    PT Treatment/Interventions ADLs/Self Care Home Management;Electrical Stimulation;Moist Heat;Gait training;Stair training;Functional mobility training;Neuromuscular re-education;Balance training;Therapeutic exercise;Therapeutic activities;Patient/family education;Manual techniques;Passive range of motion    PT Next Visit Plan R knee  AROM/ strength reassessment.  Discuss schedule    Consulted and Agree with Plan of Care Patient           Patient will benefit from skilled therapeutic intervention in order to improve the following deficits and impairments:  Abnormal gait,Pain,Improper body mechanics,Postural dysfunction,Decreased mobility,Decreased activity tolerance,Decreased endurance,Decreased range of motion,Decreased strength,Impaired UE functional use,Impaired flexibility,Difficulty walking  Visit Diagnosis: Muscle weakness (generalized)  Right knee pain, unspecified chronicity  Decreased range of motion of right knee  Chronic bilateral low back pain without sciatica  Problem List Patient Active Problem List   Diagnosis Date Noted  . Nausea 03/31/2020  . Multiple thyroid nodules 12/28/2019  . Thyroid nodule 09/29/2019  . Lymphedema 11/10/2018  . PAD (peripheral artery disease) ([place]) 10/12/2018  . Aortic atherosclerosis ([place]) 10/12/2018  . Carotid stenosis, asymptomatic, bilateral 10/12/2018  . Positive colorectal cancer screening using Cologuard test 10/09/2018  . Gastroesophageal reflux disease   . Acute gastritis without hemorrhage   . Osteoarthritis 04/29/2018  . Hiatal hernia with GERD 04/29/2018  . Hyperlipidemia 04/29/2018  . Lymphedema of both lower extremities 04/29/2018  . Left knee pain 10/23/2016  . Cataracts, bilateral 07/31/2016  . Glaucoma 07/31/2016  . Lateral epicondylitis of left elbow 10/20/2015  . Vitamin D deficiency 10/20/2015  . Type 2 diabetes, controlled, with neuropathy ([place]) 10/16/2015  . Gouty arthritis 06/03/2015  . H/O syncope 06/03/2015  . Mild intermittent asthma 06/03/2015  . Multiple gastric ulcers 06/03/2015  . Schatzki's ring 06/03/2015  . Undifferentiated connective tissue disease ([place]) 06/03/2015  . Bone disorder 04/05/2015  . Dental injury 04/05/2015  . Pericarditis 04/05/2015  . Sjogren's syndrome ([place]) 09/07/2014  . Elevated alkaline phosphatase  level 08/24/2014  . Lactose intolerance 08/24/2014  . Obesity 08/24/2014  . Peripheral edema 08/24/2014  . 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 Abdominal adhesions 08/02/2014  . 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[place]) 08/02/2014  . Degenerative joint disease (DJD) of lumbar spine 08/02/2014  . Diverticulosis of both small and large intestine 08/02/2014  . Hyperuricemia 08/02/2014  . Pure hypercholesterolemia 08/02/2014  . Trigger finger 08/02/2014  . Right shoulder pain 07/29/2014  . Benign neoplasm of colon 06/30/2010  . Right upper quadrant pain 06/30/2010   Pura Spice, PT, DPT # [id number] 05/22/2020, 8:56 PM  [place] [facility] [address] [place], [place], [address] Phone: [phone]   Fax:  [phone]  Name: [name] MRN: [id number] Date of Birth: 04-08-1946

## 2020-05-23 ENCOUNTER — Ambulatory Visit: Payer: Medicare Other | Admitting: Occupational Therapy

## 2020-05-23 ENCOUNTER — Other Ambulatory Visit: Payer: Self-pay | Admitting: Family Medicine

## 2020-05-23 ENCOUNTER — Telehealth: Payer: Self-pay

## 2020-05-23 ENCOUNTER — Other Ambulatory Visit: Payer: Self-pay

## 2020-05-23 ENCOUNTER — Ambulatory Visit: Payer: Medicare Other | Admitting: Pharmacist

## 2020-05-23 DIAGNOSIS — I890 Lymphedema, not elsewhere classified: Secondary | ICD-10-CM

## 2020-05-23 DIAGNOSIS — E7800 Pure hypercholesterolemia, unspecified: Secondary | ICD-10-CM

## 2020-05-23 DIAGNOSIS — E1140 Type 2 diabetes mellitus with diabetic neuropathy, unspecified: Secondary | ICD-10-CM

## 2020-05-23 NOTE — Telephone Encounter (Signed)
Copied from [place] [phone]. Topic: General - Other >> May 20, 2020 12:24 PM Rainey Pines A wrote: Patient is requesting a callback from [facility] or Bary Castilla in regards to how much humalog she needs to take in place of the traceba that she was denied for. Please Arnoldo Hooker

## 2020-05-23 NOTE — Telephone Encounter (Signed)
Why was she denied [place]?  We have filled out the PAP paperwork.

## 2020-05-23 NOTE — Chronic Care Management (AMB) (Signed)
Chronic Care Management   Follow Up Note   05/23/2020 Name: [name] MRN: [id number] DOB: May 28, 1946  Referred by: Patricia Bangs, FNP Reason for referral : Chronic Care Management (Patient Phone Call)   [name] is a 74 y.o. year old female who is a primary care patient of [name], [name], [name]. The CCM team was consulted for assistance with chronic disease management and care coordination needs.    I reached out to [name] by phone today.   Review of patient status, including review of consultants reports, relevant laboratory and other test results, and collaboration with appropriate care team members and the patient's provider was performed as part of comprehensive patient evaluation and provision of chronic care management services.    SDOH (Social Determinants of Health) assessments performed: No See Care Plan activities for detailed interventions related to [facility])     Outpatient Encounter Medications as of 05/23/2020  Medication Sig  . cyclobenzaprine (FLEXERIL) 5 MG tablet TAKE 1 TABLET (5 MG TOTAL) BY MOUTH ONCE DAILY AS NEEDED FOR MUSCLE SPASMS (Patient not taking: Reported on 03/29/2020)  . insulin degludec (TRESIBA FLEXTOUCH) 200 UNIT/ML FlexTouch Pen Inject 56 Units into the skin daily.  . insulin lispro ([name]) 100 UNIT/ML injection Correction scale: take 1 unit per 50 over 150 before meals up to three times daily.  Up to 20 units per day.  Marland Kitchen acetaminophen (TYLENOL) 500 MG tablet Take 500 mg by mouth daily as needed.  . ARTIFICIAL TEAR OP Apply to eye as needed.   . BD INSULIN SYRINGE U/F 31G X 5/16" 1 ML MISC USE AS DIRECTED. WITH [name]  . BIOTIN PO Take 1 mg by mouth daily.   . Cholecalciferol (VITAMIN D3) 25 MCG (1000 UT) CAPS Take 4 capsules by mouth daily.   . clindamycin (CLEOCIN) 300 MG capsule Take 600 mg by mouth. 1 Hour before dental procedures  . cloNIDine (CATAPRES) 0.1 MG tablet Take 1 tablet (0.1 mg) by mouth three times  daily  . [name] 0.6 MG tablet Take 1 tablet (0.6 mg total) by mouth 2 (two) times daily as needed. For up to 3 days for pericarditis, or up to 7 days for gout or connective tissue disorder.  . ezetimibe (ZETIA) 10 MG tablet Take 1 tablet (10 mg total) by mouth daily.  . famotidine (PEPCID) 20 MG tablet Take 20 mg by mouth 2 (two) times daily.  Marland Kitchen FREESTYLE LITE test strip USE AS DIRECTED THREE TIMES DAILY FOR DIABETES  . Insulin Pen Needle (PEN NEEDLES) 32G X 5 MM MISC 1 Device by Does not apply route daily.  Marland Kitchen lactase (LACTAID) 3000 units tablet Take by mouth as needed.   . Lancets (FREESTYLE) lancets Must test x4/day  . losartan (COZAAR) 100 MG tablet Take 1 tablet (100 mg total) by mouth daily.  . nebivolol 20 MG TABS Take 1 tablet (20 mg total) by mouth daily.  . ondansetron (ZOFRAN-ODT) 4 MG disintegrating tablet Take 1 tablet (4 mg total) by mouth every 8 (eight) hours as needed for nausea or vomiting.  . prednisoLONE acetate (PRED FORTE) 1 % ophthalmic suspension INSTILL ONE DROP  as needed  . rosuvastatin (CRESTOR) 10 MG tablet TAKE 1 TABLET BY MOUTH EVERY DAY   No facility-administered encounter medications on file as of 05/23/2020.    Goals Addressed              This Visit's Progress   .  PharmD - Medication Assistance (pt-stated)  Current Barriers:  . Financial Barriers: patient has [employer] MedicareRx Preferred Part D insurance and reports copay for Tresiba, [name], [name] and [name] is cost prohibitive at this time o Patient currently enrolled in patient assistance for Tresiba, [name] and [name] for 2021 calendar year  Pharmacist Clinical Goal(s):  Marland Kitchen Over the next 30 days, patient will work with PharmD and providers to relieve medication access concerns  Interventions: . Receive voicemail from patient requesting call back. Reports received a letter in the mail from [employer] stating that the last refill request for [name] for this calendar year was  missing information and the medication will not be shipped . Follow up with patient regarding diabetes management and patient assistance o Reports currently using: - Tresiba 56 units daily as directed (reports has 1 pen of Tresiba remaining) - [name] - according to sliding scale o Reports home readings: morning fasting ranging: 90s-120s - Denies recent s/s low blood sugar . Counsel on medication assistance/Medicare Part D plan o Patient denies being in Medicare Part D coverage gap at this time o Review with patient formulary/prescription coverage details per [employer] - Per website, patient's 30-day copayment for [name]: $35 o Patient calls [facility] and confirms will pick up 30 day supply of Tresiba for this copayment amount o Patient reports need for assistance with applying for patient assistance for [name] from [employer], [place] from [place], [employer] from [place] from [place] for 7408 calendar year . Will collaborate with [place] to request aid to patient with applying for patient assistance for [name] from [employer], [name] from [place], [employer] from [place] from [place] for 1448 calendar year . Encourage patient to continue to monitor home BP and follow up with Cardiologist for readings outside of established parameters  Patient Self Care Activities:  . Patient to contact providers for new medical questions/concerns . Patient to attend scheduled medical appointments  Please see past updates related to this goal by clicking on the "Past Updates" button in the selected goal         Plan  Telephone follow up appointment with care management team member scheduled for: 07/04/2020 at 1 pm  Harlow Asa, PharmD, [place] [phone]

## 2020-05-23 NOTE — Therapy (Addendum)
[place] MAIN [facility] SERVICES [address] [place], [place], [address] Phone: [phone]   Fax:  [phone]  Occupational Therapy Treatment  Patient Details  Name: [name] MRN: [id number] Date of Birth: [birth year] Referring Provider (OT): Ida Rogue, MD   Encounter Date: 05/23/2020   OT End of Session - 05/23/20 1458    Visit Number 24    Number of Visits 36    Date for OT Re-Evaluation 08/07/20    OT Start Time 0215    OT Stop Time 0305    OT Time Calculation (min) 50 min    Activity Tolerance Patient tolerated treatment well;No increased pain    Behavior During Therapy WFL for tasks assessed/performed           Past Medical History:  Diagnosis Date  . Anterolisthesis    Cervical spine  . Asthma   . Connective tissue disorder (HCC)    recurrent carotid arteritis, temporal arteritis, vasculitis mandible, general hepatitis, avascular necrosis bilat  . DDD (degenerative disc disease), lumbar   . Diabetes mellitus without complication ([place])   . Diverticulosis   . Duodenitis   . Gouty arthritis   . Hiatal hernia with GERD   . History of cardiovascular stress test    a. 07/2018 [facility]): Fixed inferoapical defect w/ nl contraction-->attenuation artifact. No ischemia. EF 63%.  . Hyperlipidemia   . Hypertension    a. 02/2019 Renal artery duplex: no evidence of prox RAS.  Marland Kitchen Hyperuricemia   . Keratitis sicca, bilateral ([place])   . Lymphedema of both lower extremities   . Osteoarthritis   . Pericarditis    Recurrent: Aug 97, July 05, Sept 08, July 16, July 18  . PUD (peptic ulcer disease)   . 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[place])   . Sjogren's syndrome ([place])   . Syncope   . Tendonitis, Achilles, right   . Tortuous colon   . Trigger finger     Past Surgical History:  Procedure Laterality Date  . ABDOMINAL HYSTERECTOMY    . BREAST BIOPSY    . BREAST EXCISIONAL BIOPSY Left 1986   neg  . CHOLECYSTECTOMY    .  COLONOSCOPY WITH PROPOFOL N/A 08/26/2018   Procedure: COLONOSCOPY WITH PROPOFOL;  Surgeon: Lucilla Lame, MD;  Location: [facility] ENDOSCOPY;  Service: Endoscopy;  Laterality: N/A;  . CYSTOSCOPY  02/26/2018   [name], MD   . distal arthrectomy    . ESOPHAGOGASTRODUODENOSCOPY (EGD) WITH PROPOFOL N/A 08/26/2018   Procedure: ESOPHAGOGASTRODUODENOSCOPY (EGD) WITH PROPOFOL;  Surgeon: Lucilla Lame, MD;  Location: [facility] ENDOSCOPY;  Service: Endoscopy;  Laterality: N/A;  . EXCISION NEUROMA    . KNEE SURGERY Bilateral    1985, 2001, 11/2002, 12/2006  . panhysterectomy  10/1983  . SHOULDER SURGERY Right    reverse total arthroplasty w/ biceps tenodesis  . TONSILLECTOMY AND ADENOIDECTOMY    . TOTAL HIP ARTHROPLASTY Right 04/2007  . WISDOM TOOTH EXTRACTION      There were no vitals filed for this visit.   Subjective Assessment - 05/23/20 1422    Subjective  [name] presents for OT Rx visit  24/36 to address BLE lymphedema. Pt presents with gradient compression wraps in place on LLE. Pt missed last scheduled appointment. Pt denies LE related pain this afternoon.    Pertinent History PMHx extensive; Dx contributing to leg swelling include OA, B knee and R hip arthroplasties, asthma, HTN. Suspect LE venous insufficiency.    Limitations chronic pain, chronic leg swelling,  generalized weakness, kyphotic posture    Repetition Increases Symptoms    Special Tests + stemmer sign bilaterally base of toes    Patient Stated Goals Reduce LE edema    Pain Onset --   30 yrs                       OT Treatments/Exercises (OP) - 05/23/20 0001      ADLs   ADL Education Given Yes      Manual Therapy   Manual Therapy Edema management;Manual Lymphatic Drainage (MLD)    Manual Lymphatic Drainage (MLD) MLD to LLE as established    Compression Bandaging LLE short stretch knee length wraps as established                  OT Education - 05/23/20 1423    Education Details Continued skilled  Pt/caregiver education  And LE ADL training throughout visit for lymphedema self care/ home program, including compression wrapping, compression garment and device wear/care, lymphatic pumping ther ex, simple self-MLD, and skin care. Discussed progress towards goals.    Person(s) Educated Patient    Methods Explanation;Demonstration;Handout    Comprehension Verbalized understanding;Returned demonstration               OT Long Term Goals - 03/28/20 1421      OT LONG TERM GOAL #1   Title With modified independent (extra time) Pt will be able to apply knee length, multi-layer, short stretch compression wraps daily from ankle to tibial tuberosity on one leg at a time using correct gradient techniques to return affected limb/s, as closely as possible, to premorbid size and shape, to limit leg pain and infection risk, and to improve safe functional mobility and ADLs performance.    Baseline Dependent    Time 4    Period Days    Status Achieved   achieved 4th visit     OT LONG TERM GOAL #2   Title Pt will be able to verbalize signs and symptoms of cellulitis infection and identify lymphedema precautions using printed resource (modified independence) for reference to decrease infection risk and limit LE progression over time.    Baseline Max A    Time 4    Period Days    Status Achieved      OT LONG TERM GOAL #3   Title Pt will sustain a least 85% compliance with all daily LE self-care home program components throughout Intensive Phase CDT, including impeccable skin care, lymphatic pumping ther ex,  compression wraps and simple self MLD, to ensure optimal limb volume reduction, to limit infection risk and to limit LE progression.    Baseline dependent    Time 12    Period Weeks    Status Achieved   "I'm 98% !"     OT LONG TERM GOAL #4   Title Using assistive devices (modified independence)  Pt will be able to don and doff appropriate daytime compression garments to limit edema  re-accumulation and further progression by issue date.    Baseline Max A    Time 12    Period Weeks    Status On-going      OT LONG TERM GOAL #5   Title After skilled teaching Pt  will be able to perform all lymphedema self-care home program components with modified independence (extra time, assistive devices PRN) , including simple self MLD, lymphatic pumping exercises, don, doff and wear compression garments  daily, sustain   impeccable  skin care daily, to limit lymphedema progression and infection risk.    Baseline Max A    Time 12    Period Weeks    Status On-going   Pt is trained for comrpession wrapping skin care and ther ex. She is still working on learning simple self MLD as of 03/21/20     OT LONG TERM GOAL #6   Title Pt will increase score on FOTO 10 points, from 45 to 55/100, to improve functional performance of basic ADLs.    Baseline Max A    Time 12    Period Weeks    Status On-going                 Plan - 05/23/20 1459    Clinical Impression Statement Pt reports increased pain in RLE. RLE swelling is significantly increased today. R Knee pain is also increase today.          OT Occupational Profile and History Comprehensive Assessment- Review of records and extensive additional review of physical, cognitive, psychosocial history related to current functional performance   Occupational performance deficits (Please refer to evaluation for details): ADL's;IADL's;Social Participation;Work;Leisure;Other   body image  Body Structure / Function / Physical Skills ADL;Decreased knowledge of precautions;ROM;Balance;Decreased knowledge of use of DME;Mobility;Edema;Skin integrity;Pain;IADL   Rehab Potential Good   Clinical Decision Making Several treatment options, min-mod task modification necessary   Comorbidities Affecting Occupational Performance: Presence of comorbidities impacting occupational performance   Modification or Assistance to Complete Evaluation   Min-Moderate modification of tasks or assist with assess necessary to complete eval   OT Frequency 2x / week   OT Duration 12 weeks   and PRN  OT Treatment/Interventions Self-care/ADL training;Therapeutic exercise;Manual lymph drainage;Compression bandaging;Patient/family education;Other (comment);Therapeutic activities;DME and/or AE instruction;Manual Therapy   Plan Intensive Phase CDT: 1 leg at a time to limit fall risk. MLD, skin care, ther ex , cimpression wraps -> daytime garments, HOS device, consider Flexitouch if appropriate   Recommended Other Services Fit w/ appropriate compression garments that are effective, comfortable and  Pt can don using AE and extra time   Consulted and Agree with Plan of Care Patient         Patient will benefit from skilled therapeutic intervention in order to improve the following deficits and impairments:           Visit Diagnosis: Lymphedema, not elsewhere classified    Problem List Patient Active Problem List   Diagnosis Date Noted  . Nausea 03/31/2020  . Multiple thyroid nodules 12/28/2019  . Thyroid nodule 09/29/2019  . Lymphedema 11/10/2018  . PAD (peripheral artery disease) ([place]) 10/12/2018  . Aortic atherosclerosis ([place]) 10/12/2018  . Carotid stenosis, asymptomatic, bilateral 10/12/2018  . Positive colorectal cancer screening using Cologuard test 10/09/2018  . Gastroesophageal reflux disease   . Acute gastritis without hemorrhage   . Osteoarthritis 04/29/2018  . Hiatal hernia with GERD 04/29/2018  . Hyperlipidemia 04/29/2018  . Lymphedema of both lower extremities 04/29/2018  . Left knee pain 10/23/2016  . Cataracts, bilateral 07/31/2016  . Glaucoma 07/31/2016  . Lateral epicondylitis of left elbow 10/20/2015  . Vitamin D deficiency 10/20/2015  . Type 2 diabetes, controlled, with neuropathy ([place]) 10/16/2015  . Gouty arthritis 06/03/2015  . H/O syncope 06/03/2015  . Mild intermittent asthma 06/03/2015  . Multiple gastric  ulcers 06/03/2015  . Schatzki's ring 06/03/2015  . Undifferentiated connective tissue disease ([place]) 06/03/2015  . Bone disorder 04/05/2015  . Dental injury 04/05/2015  . Pericarditis 04/05/2015  .  Sjogren's syndrome ([place]) 09/07/2014  . Elevated alkaline phosphatase level 08/24/2014  . Lactose intolerance 08/24/2014  . Obesity 08/24/2014  . Peripheral edema 08/24/2014  . 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 Abdominal adhesions 08/02/2014  . 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[place]) 08/02/2014  . Degenerative joint disease (DJD) of lumbar spine 08/02/2014  . Diverticulosis of both small and large intestine 08/02/2014  . Hyperuricemia 08/02/2014  . Pure hypercholesterolemia 08/02/2014  . Trigger finger 08/02/2014  . Right shoulder pain 07/29/2014  . Benign neoplasm of colon 06/30/2010  . Right upper quadrant pain 06/30/2010    Andrey Spearman, MS, OTR/L, [facility] 05/23/20 4:18 PM   [place] MAIN [facility] SERVICES [address] [place], [place], [address] Phone: [phone]   Fax:  [phone]  Name: Akira Perusse MRN: [id number] Date of Birth: [birth year]

## 2020-05-23 NOTE — Telephone Encounter (Signed)
I attempted to contact the patient, no answer. I left a very detail message on the patient vm. [name] verbalize that the patient can pick up a sample of the tresiba to bridge her until she make an appt with her Endocrinologist to manage her diabetes medication since the denial of her [place].

## 2020-05-23 NOTE — Patient Instructions (Signed)
Thank you allowing the Chronic Care Management Team to be a part of your care! It was a pleasure speaking with you today!     CCM (Chronic Care Management) Team    Noreene Larsson RN, MSN, CCM Nurse Care Coordinator  [phone]   Harlow Asa PharmD  Clinical Pharmacist  [phone]   Eula Fried LCSW Clinical Social Worker [phone]  Visit Information  Goals Addressed              This Visit's Progress   .  PharmD - Medication Assistance (pt-stated)        Current Barriers:  . Financial Barriers: patient has [place] MedicareRx Preferred Part D insurance and reports copay for Tresiba, Humalog, Bystolic and Colcrys is cost prohibitive at this time o Patient currently enrolled in patient assistance for Tresiba, Humalog and Colcrys for 2021 calendar year  Pharmacist Clinical Goal(s):  Marland Kitchen Over the next 30 days, patient will work with PharmD and providers to relieve medication access concerns  Interventions: . Receive voicemail from patient requesting call back. Reports received a letter in the mail from [employer] stating that the last refill request for [name] for this calendar year was missing information and the medication will not be shipped . Follow up with patient regarding diabetes management and patient assistance o Reports currently using: - Tresiba 56 units daily as directed (reports has 1 pen of Tresiba remaining) - Humalog - according to sliding scale o Reports home readings: morning fasting ranging: 90s-120s - Denies recent s/s low blood sugar . Counsel on medication assistance/Medicare Part D plan o Patient denies being in Medicare Part D coverage gap at this time o Review with patient formulary/prescription coverage details per [employer] - Per website, patient's 30-day copayment for [name]: $35 o [facility] and confirms will pick up 30 day supply of Tresiba for this copayment amount o Patient reports need for  assistance with applying for patient assistance for [place] from [employer], [place] from [place], [employer] from [place] from [place] for 5929 calendar year . Will collaborate with [place] to request aid to patient with applying for patient assistance for [place] from [employer], Humal[place] from [place], [employer] from [place] from [place] for 2446 calendar year . Encourage patient to continue to monitor home BP and follow up with Cardiologist for readings outside of established parameters  Patient Self Care Activities:  . Patient to contact providers for new medical questions/concerns . Patient to attend scheduled medical appointments  Please see past updates related to this goal by clicking on the "Past Updates" button in the selected goal         The patient verbalized understanding of instructions, educational materials, and care plan provided today and declined offer to receive copy of patient instructions, educational materials, and care plan.   Telephone follow up appointment with care management team member scheduled for: 07/04/2020 at 1 pm  Harlow Asa, PharmD, [place] [phone]

## 2020-05-24 ENCOUNTER — Other Ambulatory Visit: Payer: Self-pay

## 2020-05-24 ENCOUNTER — Ambulatory Visit: Payer: Medicare Other | Admitting: Physical Therapy

## 2020-05-24 ENCOUNTER — Encounter: Payer: Self-pay | Admitting: Physical Therapy

## 2020-05-24 DIAGNOSIS — M25561 Pain in right knee: Secondary | ICD-10-CM

## 2020-05-24 DIAGNOSIS — M6281 Muscle weakness (generalized): Secondary | ICD-10-CM | POA: Diagnosis not present

## 2020-05-24 DIAGNOSIS — G8929 Other chronic pain: Secondary | ICD-10-CM

## 2020-05-24 DIAGNOSIS — M25661 Stiffness of right knee, not elsewhere classified: Secondary | ICD-10-CM

## 2020-05-25 ENCOUNTER — Ambulatory Visit: Payer: Medicare Other | Admitting: Occupational Therapy

## 2020-05-25 ENCOUNTER — Other Ambulatory Visit: Payer: Self-pay | Admitting: Family Medicine

## 2020-05-25 DIAGNOSIS — R1011 Right upper quadrant pain: Secondary | ICD-10-CM

## 2020-05-25 DIAGNOSIS — I890 Lymphedema, not elsewhere classified: Secondary | ICD-10-CM

## 2020-05-25 NOTE — Telephone Encounter (Signed)
Requested medication (s) are due for refill today: yes  Requested medication (s) are on the active medication list: yes  Last refill:  03/03/2020  Future visit scheduled: no  Notes to clinic:  this refill cannot be delegated    Requested Prescriptions  Pending Prescriptions Disp Refills   cyclobenzaprine (FLEXERIL) 5 MG tablet 30 tablet 2      There is no refill protocol information for this order

## 2020-05-25 NOTE — Therapy (Signed)
[facility] [address]. [place], [place], [address] Phone: [phone]   Fax:  [phone]  Physical Therapy Treatment  Patient Details  Name: [name] MRN: [id number] Date of Birth: December 03, 1945 Referring Provider (PT): Dr. Harlow Mares   Encounter Date: 05/24/2020   PT End of Session - 05/25/20 1554    Visit Number 29    Number of Visits 50    Date for PT Re-Evaluation 06/16/20    Authorization - Visit Number 7    Authorization - Number of Visits 10    PT Start Time 1194    PT Stop Time 1520    PT Time Calculation (min) 48 min    Activity Tolerance Patient tolerated treatment well    Behavior During Therapy [facility] for tasks assessed/performed           Past Medical History:  Diagnosis Date   Anterolisthesis    Cervical spine   Asthma    Connective tissue disorder ([place])    recurrent carotid arteritis, temporal arteritis, vasculitis mandible, general hepatitis, avascular necrosis bilat   DDD (degenerative disc disease), lumbar    Diabetes mellitus without complication ([place])    Diverticulosis    Duodenitis    Gouty arthritis    Hiatal hernia with GERD    History of cardiovascular stress test    a. 07/2018 [facility]): Fixed inferoapical defect w/ nl contraction-->attenuation artifact. No ischemia. EF 63%.   Hyperlipidemia    Hypertension    a. 02/2019 Renal artery duplex: no evidence of prox RAS.   Hyperuricemia    Keratitis sicca, bilateral (HCC)    Lymphedema of both lower extremities    Osteoarthritis    Pericarditis    Recurrent: Aug 97, July 05, Sept 08, July 16, July 18   PUD (peptic ulcer disease)    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[place])    Sjogren's syndrome ([place])    Syncope    Tendonitis, Achilles, right    Tortuous colon    Trigger finger     Past Surgical History:  Procedure Laterality Date   ABDOMINAL HYSTERECTOMY     BREAST BIOPSY     BREAST EXCISIONAL BIOPSY Left 1986    neg   CHOLECYSTECTOMY     COLONOSCOPY WITH PROPOFOL N/A 08/26/2018   Procedure: COLONOSCOPY WITH PROPOFOL;  Surgeon: Lucilla Lame, MD;  Location: [facility] ENDOSCOPY;  Service: Endoscopy;  Laterality: N/A;   CYSTOSCOPY  02/26/2018   [name], MD    distal arthrectomy     ESOPHAGOGASTRODUODENOSCOPY (EGD) WITH PROPOFOL N/A 08/26/2018   Procedure: ESOPHAGOGASTRODUODENOSCOPY (EGD) WITH PROPOFOL;  Surgeon: Lucilla Lame, MD;  Location: [facility] ENDOSCOPY;  Service: Endoscopy;  Laterality: N/A;   EXCISION NEUROMA     KNEE SURGERY Bilateral    1985, 2001, 11/2002, 12/2006   panhysterectomy  10/1983   SHOULDER SURGERY Right    reverse total arthroplasty w/ biceps tenodesis   TONSILLECTOMY AND ADENOIDECTOMY     TOTAL HIP ARTHROPLASTY Right 04/2007   WISDOM TOOTH EXTRACTION      There were no vitals filed for this visit.   Subjective Assessment - 05/24/20 1427    Subjective BP: 152/70.  Pt. states she tweaked R knee while sitting on edge of bed with a lateral movement.  Pt. reports continued tenderness in knee.  Pt. reports persistent low back discomfort with prolonged standing/walking.    Pertinent History Pt. from [place] and came to [place] to take care of friend and then Covid happened.  Pt. retired.  Pts. ex-husband lives in her house in [place].  R shoulder limitations (5# lifting restrictions).  Pt. went to [employer] for 10 years (every week) with benefits reported in low back.    Limitations Standing;Walking;Writing    Patient Stated Goals Decrease back pain/ improve strength/ mobility.    Currently in Pain? Yes    Pain Score 2     Pain Orientation Right    Pain Onset --   30 yrs          There.ex.:  Nustep L3 10 min. B LE (consistent cadence)- discussed daily activity  Seated/ standing LE therex./ Walking in //-bars with high marching/ lateral 4x each.  Walking in //-bars with alt. UE/LE touches 3x (mirror feedback).  Standing hamstring curls 10x2.  Walking in PT clinic with posture  feedback/ consistent step pattern.   Supine hamstring stretches 3x each/ knee to chest/ trunk rotn./ partial bridging 10x.         PT Long Term Goals - 04/21/20 1631      PT LONG TERM GOAL #1   Title Pt. will improve FOTO to predicted score to demonstrate improvement in pain free functional mobility.    Baseline 11/11: TBD    Time 8    Period Weeks    Status New    Target Date 06/16/20      PT LONG TERM GOAL #2   Title Pt. will improve R knee flexion AROM to at least 120 deg to improve stair climbing ability without compensations.    Baseline 11/11: R knee flex AROM: 105 deg    Time 8    Period Weeks    Status New    Target Date 06/16/20      PT LONG TERM GOAL #3   Title Pt. will improve R knee flexion strength to 5/5 without pain to improve pain free functional mobility.    Baseline 11/11: R knee flex: 4+/5 with pain    Time 8    Period Weeks    Status New    Target Date 06/16/20               Plan - 05/25/20 1600    Clinical Impression Statement Pt. reports generalized back/knee discomfort t/o tx. session with no worsening of symptoms.  Extra time/ focus required with alt. UE/LE touches and walking in //-bars.  Pt. does well with seated/ standing ther.ex. with no resistance and walking tasks in //-bars to focus on quad strengthening.  PT did not change HEP and recommends pt. focus on increase walking time/ endurance as tolerated.    Personal Factors and Comorbidities Education;Comorbidity 3+    Comorbidities AAA, AVN, HTN, severe LE edema bilat (L>R)    Examination-Activity Limitations Locomotion Level;Squat;Stairs;Stand    Examination-Participation Restrictions Yard Work;Driving;Community Activity    Stability/Clinical Decision Making Evolving/Moderate complexity    Clinical Decision Making Moderate    Rehab Potential Fair    PT Frequency 2x / week    PT Duration 8 weeks    PT Treatment/Interventions ADLs/Self Care Home Management;Electrical Stimulation;Moist  Heat;Gait training;Stair training;Functional mobility training;Neuromuscular re-education;Balance training;Therapeutic exercise;Therapeutic activities;Patient/family education;Manual techniques;Passive range of motion    PT Next Visit Plan R knee AROM/ strength reassessment.  Discuss schedule    Consulted and Agree with Plan of Care Patient           Patient will benefit from skilled therapeutic intervention in order to improve the following deficits and impairments:  Abnormal gait,Pain,Improper body mechanics,Postural dysfunction,Decreased mobility,Decreased  activity tolerance,Decreased endurance,Decreased range of motion,Decreased strength,Impaired UE functional use,Impaired flexibility,Difficulty walking  Visit Diagnosis: Muscle weakness (generalized)  Right knee pain, unspecified chronicity  Decreased range of motion of right knee  Chronic bilateral low back pain without sciatica     Problem List Patient Active Problem List   Diagnosis Date Noted   Nausea 03/31/2020   Multiple thyroid nodules 12/28/2019   Thyroid nodule 09/29/2019   Lymphedema 11/10/2018   PAD (peripheral artery disease) ([place]) 10/12/2018   Aortic atherosclerosis ([place]) 10/12/2018   Carotid stenosis, asymptomatic, bilateral 10/12/2018   Positive colorectal cancer screening using Cologuard test 10/09/2018   Gastroesophageal reflux disease    Acute gastritis without hemorrhage    Osteoarthritis 04/29/2018   Hiatal hernia with GERD 04/29/2018   Hyperlipidemia 04/29/2018   Lymphedema of both lower extremities 04/29/2018   Left knee pain 10/23/2016   Cataracts, bilateral 07/31/2016   Glaucoma 07/31/2016   Lateral epicondylitis of left elbow 10/20/2015   Vitamin D deficiency 10/20/2015   Type 2 diabetes, controlled, with neuropathy ([place]) 10/16/2015   Gouty arthritis 06/03/2015   H/O syncope 06/03/2015   Mild intermittent asthma 06/03/2015   Multiple gastric ulcers 06/03/2015    Schatzki's ring 06/03/2015   Undifferentiated connective tissue disease ([place]) 06/03/2015   Bone disorder 04/05/2015   Dental injury 04/05/2015   Pericarditis 04/05/2015   Sjogren's syndrome ([place]) 09/07/2014   Elevated alkaline phosphatase level 08/24/2014   Lactose intolerance 08/24/2014   Obesity 08/24/2014   Peripheral edema 08/24/2014   Benign hypertension with CKD (chronic kidney disease) stage III 08/03/2014   Abdominal adhesions 08/02/2014   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[place]) 08/02/2014   Degenerative joint disease (DJD) of lumbar spine 08/02/2014   Diverticulosis of both small and large intestine 08/02/2014   Hyperuricemia 08/02/2014   Pure hypercholesterolemia 08/02/2014   Trigger finger 08/02/2014   Right shoulder pain 07/29/2014   Benign neoplasm of colon 06/30/2010   Right upper quadrant pain 06/30/2010   Pura Spice, PT, DPT # [id number] 05/25/2020, 6:18 PM  [place] [facility] [address] [place], [place], [address] Phone: [phone]   Fax:  [phone]  Name: Terri Rorrer MRN: [id number] Date of Birth: [birth year]

## 2020-05-25 NOTE — Therapy (Addendum)
[place] MAIN [facility] SERVICES [address] [place], [place], [address] Phone: [phone]   Fax:  [phone]  Occupational Therapy Treatment  Patient Details  Name: [name] MRN: [id number] Date of Birth: [birth year] Referring Provider (OT): Ida Rogue, MD   Encounter Date: 05/25/2020   OT End of Session - 05/25/20 1458    Visit Number 25   Number of Visits 36    Date for OT Re-Evaluation 08/07/20    OT Start Time 0205    OT Stop Time 0300    OT Time Calculation (min) 55 min    Activity Tolerance Patient tolerated treatment well;No increased pain;Patient limited by pain    Behavior During Therapy [facility] for tasks assessed/performed           Past Medical History:  Diagnosis Date  . Anterolisthesis    Cervical spine  . Asthma   . Connective tissue disorder (HCC)    recurrent carotid arteritis, temporal arteritis, vasculitis mandible, general hepatitis, avascular necrosis bilat  . DDD (degenerative disc disease), lumbar   . Diabetes mellitus without complication ([place])   . Diverticulosis   . Duodenitis   . Gouty arthritis   . Hiatal hernia with GERD   . History of cardiovascular stress test    a. 07/2018 [facility]): Fixed inferoapical defect w/ nl contraction-->attenuation artifact. No ischemia. EF 63%.  . Hyperlipidemia   . Hypertension    a. 02/2019 Renal artery duplex: no evidence of prox RAS.  Marland Kitchen Hyperuricemia   . Keratitis sicca, bilateral ([place])   . Lymphedema of both lower extremities   . Osteoarthritis   . Pericarditis    Recurrent: Aug 97, July 05, Sept 08, July 16, July 18  . PUD (peptic ulcer disease)   . 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[place])   . Sjogren's syndrome ([place])   . Syncope   . Tendonitis, Achilles, right   . Tortuous colon   . Trigger finger     Past Surgical History:  Procedure Laterality Date  . ABDOMINAL HYSTERECTOMY    . BREAST BIOPSY    . BREAST EXCISIONAL BIOPSY Left 1986   neg  .  CHOLECYSTECTOMY    . COLONOSCOPY WITH PROPOFOL N/A 08/26/2018   Procedure: COLONOSCOPY WITH PROPOFOL;  Surgeon: Lucilla Lame, MD;  Location: [facility] ENDOSCOPY;  Service: Endoscopy;  Laterality: N/A;  . CYSTOSCOPY  02/26/2018   [name], MD   . distal arthrectomy    . ESOPHAGOGASTRODUODENOSCOPY (EGD) WITH PROPOFOL N/A 08/26/2018   Procedure: ESOPHAGOGASTRODUODENOSCOPY (EGD) WITH PROPOFOL;  Surgeon: Lucilla Lame, MD;  Location: [facility] ENDOSCOPY;  Service: Endoscopy;  Laterality: N/A;  . EXCISION NEUROMA    . KNEE SURGERY Bilateral    1985, 2001, 11/2002, 12/2006  . panhysterectomy  10/1983  . SHOULDER SURGERY Right    reverse total arthroplasty w/ biceps tenodesis  . TONSILLECTOMY AND ADENOIDECTOMY    . TOTAL HIP ARTHROPLASTY Right 04/2007  . WISDOM TOOTH EXTRACTION      There were no vitals filed for this visit.   Subjective Assessment - 05/25/20 1415    Subjective  [name] presents for OT Rx visit  25/36 to address BLE lymphedema. Pt presents with gradient compression wraps in place on LLE. Pt denies LE related pain this afternoon.    Pertinent History PMHx extensive; Dx contributing to leg swelling include OA, B knee and R hip arthroplasties, asthma, HTN. Suspect LE venous insufficiency.    Limitations chronic pain, chronic leg swelling, generalized weakness, kyphotic  posture    Repetition Increases Symptoms    Special Tests + stemmer sign bilaterally base of toes    Patient Stated Goals Reduce LE edema    Pain Onset --   30 yrs                       OT Treatments/Exercises (OP) - 05/25/20 0001      ADLs   ADL Education Given Yes      Manual Therapy   Manual Therapy Edema management;Manual Lymphatic Drainage (MLD);Compression Bandaging    Manual Lymphatic Drainage (MLD) MLD to RLE as established    Compression Bandaging LLE short stretch knee length wraps as established                  OT Education - 05/25/20 1458    Education Details Continued  skilled Pt/caregiver education  And LE ADL training throughout visit for lymphedema self care/ home program, including compression wrapping, compression garment and device wear/care, lymphatic pumping ther ex, simple self-MLD, and skin care. Discussed progress towards goals.    Person(s) Educated Patient    Methods Explanation;Demonstration;Handout    Comprehension Verbalized understanding;Returned demonstration               OT Long Term Goals - 03/28/20 1421      OT LONG TERM GOAL #1   Title With modified independent (extra time) Pt will be able to apply knee length, multi-layer, short stretch compression wraps daily from ankle to tibial tuberosity on one leg at a time using correct gradient techniques to return affected limb/s, as closely as possible, to premorbid size and shape, to limit leg pain and infection risk, and to improve safe functional mobility and ADLs performance.    Baseline Dependent    Time 4    Period Days    Status Achieved   achieved 4th visit     OT LONG TERM GOAL #2   Title Pt will be able to verbalize signs and symptoms of cellulitis infection and identify lymphedema precautions using printed resource (modified independence) for reference to decrease infection risk and limit LE progression over time.    Baseline Max A    Time 4    Period Days    Status Achieved      OT LONG TERM GOAL #3   Title Pt will sustain a least 85% compliance with all daily LE self-care home program components throughout Intensive Phase CDT, including impeccable skin care, lymphatic pumping ther ex,  compression wraps and simple self MLD, to ensure optimal limb volume reduction, to limit infection risk and to limit LE progression.    Baseline dependent    Time 12    Period Weeks    Status Achieved   "I'm 98% !"     OT LONG TERM GOAL #4   Title Using assistive devices (modified independence)  Pt will be able to don and doff appropriate daytime compression garments to limit edema  re-accumulation and further progression by issue date.    Baseline Max A    Time 12    Period Weeks    Status On-going      OT LONG TERM GOAL #5   Title After skilled teaching Pt  will be able to perform all lymphedema self-care home program components with modified independence (extra time, assistive devices PRN) , including simple self MLD, lymphatic pumping exercises, don, doff and wear compression garments  daily, sustain   impeccable skin care  daily, to limit lymphedema progression and infection risk.    Baseline Max A    Time 12    Period Weeks    Status On-going   Pt is trained for comrpession wrapping skin care and ther ex. She is still working on learning simple self MLD as of 03/21/20     OT LONG TERM GOAL #6   Title Pt will increase score on FOTO 10 points, from 45 to 55/100, to improve functional performance of basic ADLs.    Baseline Max A    Time 12    Period Weeks    Status On-going                 Plan - 05/25/20 1459    Clinical Impression Statement Commenced MLD to RLE today. Pt tolerated without increased pain despite painful knee injury. Pt still awaiting delivery of remake for custom L garment . Cont as per POC.          OT Occupational Profile and History Comprehensive Assessment- Review of records and extensive additional review of physical, cognitive, psychosocial history related to current functional performance   Occupational performance deficits (Please refer to evaluation for details): ADL's;IADL's;Social Participation;Work;Leisure;Other   body image  Body Structure / Function / Physical Skills ADL;Decreased knowledge of precautions;ROM;Balance;Decreased knowledge of use of DME;Mobility;Edema;Skin integrity;Pain;IADL   Rehab Potential Good   Clinical Decision Making Several treatment options, min-mod task modification necessary   Comorbidities Affecting Occupational Performance: Presence of comorbidities impacting occupational performance    Modification or Assistance to Complete Evaluation  Min-Moderate modification of tasks or assist with assess necessary to complete eval   OT Frequency 2x / week   OT Duration 12 weeks   and PRN  OT Treatment/Interventions Self-care/ADL training;Therapeutic exercise;Manual lymph drainage;Compression bandaging;Patient/family education;Other (comment);Therapeutic activities;DME and/or AE instruction;Manual Therapy   Plan Intensive Phase CDT: 1 leg at a time to limit fall risk. MLD, skin care, ther ex , cimpression wraps -> daytime garments, HOS device, consider Flexitouch if appropriate   Recommended Other Services Fit w/ appropriate compression garments that are effective, comfortable and  Pt can don using AE and extra time   Consulted and Agree with Plan of Care Patient         Patient will benefit from skilled therapeutic intervention in order to improve the following deficits and impairments:           Visit Diagnosis: Lymphedema, not elsewhere classified    Problem List Patient Active Problem List   Diagnosis Date Noted  . Nausea 03/31/2020  . Multiple thyroid nodules 12/28/2019  . Thyroid nodule 09/29/2019  . Lymphedema 11/10/2018  . PAD (peripheral artery disease) ([place]) 10/12/2018  . Aortic atherosclerosis ([place]) 10/12/2018  . Carotid stenosis, asymptomatic, bilateral 10/12/2018  . Positive colorectal cancer screening using Cologuard test 10/09/2018  . Gastroesophageal reflux disease   . Acute gastritis without hemorrhage   . Osteoarthritis 04/29/2018  . Hiatal hernia with GERD 04/29/2018  . Hyperlipidemia 04/29/2018  . Lymphedema of both lower extremities 04/29/2018  . Left knee pain 10/23/2016  . Cataracts, bilateral 07/31/2016  . Glaucoma 07/31/2016  . Lateral epicondylitis of left elbow 10/20/2015  . Vitamin D deficiency 10/20/2015  . Type 2 diabetes, controlled, with neuropathy ([place]) 10/16/2015  . Gouty arthritis 06/03/2015  . H/O syncope 06/03/2015  . Mild  intermittent asthma 06/03/2015  . Multiple gastric ulcers 06/03/2015  . Schatzki's ring 06/03/2015  . Undifferentiated connective tissue disease ([place]) 06/03/2015  . Bone disorder 04/05/2015  .  Dental injury 04/05/2015  . Pericarditis 04/05/2015  . Sjogren's syndrome ([place]) 09/07/2014  . Elevated alkaline phosphatase level 08/24/2014  . Lactose intolerance 08/24/2014  . Obesity 08/24/2014  . Peripheral edema 08/24/2014  . 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 Abdominal adhesions 08/02/2014  . 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[place]) 08/02/2014  . Degenerative joint disease (DJD) of lumbar spine 08/02/2014  . Diverticulosis of both small and large intestine 08/02/2014  . Hyperuricemia 08/02/2014  . Pure hypercholesterolemia 08/02/2014  . Trigger finger 08/02/2014  . Right shoulder pain 07/29/2014  . Benign neoplasm of colon 06/30/2010  . Right upper quadrant pain 06/30/2010    Andrey Spearman, MS, OTR/L, [facility] 05/25/20 4:44 PM   [place] MAIN [facility] SERVICES [address] [place], [place], [address] Phone: [phone]   Fax:  [phone]  Name: [name] MRN: [id number] Date of Birth: 1945-06-15

## 2020-05-25 NOTE — Telephone Encounter (Signed)
Medication Refill - Medication: Flexeril 5mg   Has the patient contacted their pharmacy? Yes.   (Agent: If no, request that the patient contact the pharmacy for the refill.) (Agent: If yes, when and what did the pharmacy advise?)  Preferred Pharmacy (with phone number or street name): [id number] - [place], [address]  Agent: Please be advised that RX refills may take up to 3 business days. We ask that you follow-up with your pharmacy.

## 2020-05-26 ENCOUNTER — Other Ambulatory Visit: Payer: Self-pay

## 2020-05-26 ENCOUNTER — Ambulatory Visit: Payer: Medicare Other | Admitting: Physical Therapy

## 2020-05-26 DIAGNOSIS — M25561 Pain in right knee: Secondary | ICD-10-CM

## 2020-05-26 DIAGNOSIS — M6281 Muscle weakness (generalized): Secondary | ICD-10-CM | POA: Diagnosis not present

## 2020-05-26 DIAGNOSIS — M25661 Stiffness of right knee, not elsewhere classified: Secondary | ICD-10-CM

## 2020-05-26 DIAGNOSIS — G8929 Other chronic pain: Secondary | ICD-10-CM

## 2020-05-26 MED ORDER — CYCLOBENZAPRINE HCL 5 MG PO TABS: 5.0000 mg | ORAL_TABLET | Freq: Every day | ORAL | 2 refills | Status: AC | PRN

## 2020-05-27 ENCOUNTER — Encounter: Payer: Self-pay | Admitting: Physical Therapy

## 2020-05-27 NOTE — Therapy (Addendum)
[facility] [address] [place], [place], [address] Phone: [phone]   Fax:  [phone]  Physical Therapy Treatment Physical Therapy Progress Note  Dates of reporting period  04/07/20   to   05/26/20  Patient Details  Name: [name] MRN: [id number] Date of Birth: 1946/03/27 Referring Provider (PT): Dr. Harlow Mares   Encounter Date: 05/26/2020   PT End of Session - 05/27/20 1409    Visit Number 30    Number of Visits 38    Date for PT Re-Evaluation 06/16/20    Authorization - Visit Number 8    Authorization - Number of Visits 10    PT Start Time 0272    PT Stop Time 1523    PT Time Calculation (min) 51 min    Activity Tolerance Patient tolerated treatment well    Behavior During Therapy [facility] for tasks assessed/performed           Past Medical History:  Diagnosis Date  . Anterolisthesis    Cervical spine  . Asthma   . Connective tissue disorder (HCC)    recurrent carotid arteritis, temporal arteritis, vasculitis mandible, general hepatitis, avascular necrosis bilat  . DDD (degenerative disc disease), lumbar   . Diabetes mellitus without complication ([place])   . Diverticulosis   . Duodenitis   . Gouty arthritis   . Hiatal hernia with GERD   . History of cardiovascular stress test    a. 07/2018 [facility]): Fixed inferoapical defect w/ nl contraction-->attenuation artifact. No ischemia. EF 63%.  . Hyperlipidemia   . Hypertension    a. 02/2019 Renal artery duplex: no evidence of prox RAS.  Marland Kitchen Hyperuricemia   . Keratitis sicca, bilateral ([place])   . Lymphedema of both lower extremities   . Osteoarthritis   . Pericarditis    Recurrent: Aug 97, July 05, Sept 08, July 16, July 18  . PUD (peptic ulcer disease)   . 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[place])   . Sjogren's syndrome ([place])   . Syncope   . Tendonitis, Achilles, right   . Tortuous colon   . Trigger finger     Past Surgical History:  Procedure Laterality Date  .  ABDOMINAL HYSTERECTOMY    . BREAST BIOPSY    . BREAST EXCISIONAL BIOPSY Left 1986   neg  . CHOLECYSTECTOMY    . COLONOSCOPY WITH PROPOFOL N/A 08/26/2018   Procedure: COLONOSCOPY WITH PROPOFOL;  Surgeon: Lucilla Lame, MD;  Location: [facility] ENDOSCOPY;  Service: Endoscopy;  Laterality: N/A;  . CYSTOSCOPY  02/26/2018   [name], MD   . distal arthrectomy    . ESOPHAGOGASTRODUODENOSCOPY (EGD) WITH PROPOFOL N/A 08/26/2018   Procedure: ESOPHAGOGASTRODUODENOSCOPY (EGD) WITH PROPOFOL;  Surgeon: Lucilla Lame, MD;  Location: [facility] ENDOSCOPY;  Service: Endoscopy;  Laterality: N/A;  . EXCISION NEUROMA    . KNEE SURGERY Bilateral    1985, 2001, 11/2002, 12/2006  . panhysterectomy  10/1983  . SHOULDER SURGERY Right    reverse total arthroplasty w/ biceps tenodesis  . TONSILLECTOMY AND ADENOIDECTOMY    . TOTAL HIP ARTHROPLASTY Right 04/2007  . WISDOM TOOTH EXTRACTION      There were no vitals filed for this visit.   Subjective Assessment - 05/27/20 1404    Subjective Pt. entered PT with significant R lower leg swelling/ lymphadema.  Pt. states she received her first manual lymphadema treatment to the R lower leg yesterday.  Pt. reports discomfort in back/ R knee.    Pertinent History Pt. from  [place] and came to [place] to take care of friend and then Covid happened.  Pt. retired.  Pts. ex-husband lives in her house in [place].  R shoulder limitations (5# lifting restrictions).  Pt. went to [employer] for 10 years (every week) with benefits reported in low back.    Limitations Standing;Walking;Writing    Patient Stated Goals Decrease back pain/ improve strength/ mobility.    Currently in Pain? Yes   no subjective pain score   Pain Onset --   30 yrs           There.ex.:  Nustep L3 10 min. B LE (consistent cadence)- discussed daily activity  Walking in //-bars with high marching/ lateral 4x each.  STS with UE assist required 5x (mirorr feedback).    Walking in PT clinic with posture feedback/ consistent step  pattern.   Reviewed HEP  Manual tx.:  Prone position: STM to mid-thoracic/ lumbar paraspinals.  Grade II-III PA mobs-  Unilateral L/R from T5 to L2 spine.  Pt. Has moderate hypomobility but tolerated well and reports feeling slightly better in back after returning to standing position.          PT Long Term Goals - 04/21/20 1631      PT LONG TERM GOAL #1   Title Pt. will improve FOTO to predicted score to demonstrate improvement in pain free functional mobility.    Baseline 11/11: TBD    Time 8    Period Weeks    Status New    Target Date 06/16/20      PT LONG TERM GOAL #2   Title Pt. will improve R knee flexion AROM to at least 120 deg to improve stair climbing ability without compensations.    Baseline 11/11: R knee flex AROM: 105 deg    Time 8    Period Weeks    Status New    Target Date 06/16/20      PT LONG TERM GOAL #3   Title Pt. will improve R knee flexion strength to 5/5 without pain to improve pain free functional mobility.    Baseline 11/11: R knee flex: 4+/5 with pain    Time 8    Period Weeks    Status New    Target Date 06/16/20                 Plan - 05/27/20 1410    Clinical Impression Statement Moderate mid thoracic to lumbar spinal hypomobility noted during prone assessment (PA mobs.).  Pt. benefits from and requires UE assist with STS and correction of gait pattern/ posture in //-bars.  No LOB but pelvic asymmetry noted due to L walking shoe.  Good LE muscle endurance and no increase c/o R knee pain during standing ther.ex.    Personal Factors and Comorbidities Education;Comorbidity 3+    Comorbidities AAA, AVN, HTN, severe LE edema bilat (L>R)    Examination-Activity Limitations Locomotion Level;Squat;Stairs;Stand    Examination-Participation Restrictions Yard Work;Driving;Community Activity    Stability/Clinical Decision Making Evolving/Moderate complexity    Clinical Decision Making Moderate    Rehab Potential Fair    PT Frequency 2x /  week    PT Duration 8 weeks    PT Treatment/Interventions ADLs/Self Care Home Management;Electrical Stimulation;Moist Heat;Gait training;Stair training;Functional mobility training;Neuromuscular re-education;Balance training;Therapeutic exercise;Therapeutic activities;Patient/family education;Manual techniques;Passive range of motion    PT Next Visit Plan Check schedule over holiday and discuss certification dates.    Consulted and Agree with Plan of Care Patient  Patient will benefit from skilled therapeutic intervention in order to improve the following deficits and impairments:  Abnormal gait,Pain,Improper body mechanics,Postural dysfunction,Decreased mobility,Decreased activity tolerance,Decreased endurance,Decreased range of motion,Decreased strength,Impaired UE functional use,Impaired flexibility,Difficulty walking  Visit Diagnosis: Muscle weakness (generalized)  Right knee pain, unspecified chronicity  Decreased range of motion of right knee  Chronic bilateral low back pain without sciatica     Problem List Patient Active Problem List   Diagnosis Date Noted  . Nausea 03/31/2020  . Multiple thyroid nodules 12/28/2019  . Thyroid nodule 09/29/2019  . Lymphedema 11/10/2018  . PAD (peripheral artery disease) ([place]) 10/12/2018  . Aortic atherosclerosis ([place]) 10/12/2018  . Carotid stenosis, asymptomatic, bilateral 10/12/2018  . Positive colorectal cancer screening using Cologuard test 10/09/2018  . Gastroesophageal reflux disease   . Acute gastritis without hemorrhage   . Osteoarthritis 04/29/2018  . Hiatal hernia with GERD 04/29/2018  . Hyperlipidemia 04/29/2018  . Lymphedema of both lower extremities 04/29/2018  . Left knee pain 10/23/2016  . Cataracts, bilateral 07/31/2016  . Glaucoma 07/31/2016  . Lateral epicondylitis of left elbow 10/20/2015  . Vitamin D deficiency 10/20/2015  . Type 2 diabetes, controlled, with neuropathy ([place]) 10/16/2015  . Gouty  arthritis 06/03/2015  . H/O syncope 06/03/2015  . Mild intermittent asthma 06/03/2015  . Multiple gastric ulcers 06/03/2015  . Schatzki's ring 06/03/2015  . Undifferentiated connective tissue disease ([place]) 06/03/2015  . Bone disorder 04/05/2015  . Dental injury 04/05/2015  . Pericarditis 04/05/2015  . Sjogren's syndrome ([place]) 09/07/2014  . Elevated alkaline phosphatase level 08/24/2014  . Lactose intolerance 08/24/2014  . Obesity 08/24/2014  . Peripheral edema 08/24/2014  . 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 Abdominal adhesions 08/02/2014  . 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[place]) 08/02/2014  . Degenerative joint disease (DJD) of lumbar spine 08/02/2014  . Diverticulosis of both small and large intestine 08/02/2014  . Hyperuricemia 08/02/2014  . Pure hypercholesterolemia 08/02/2014  . Trigger finger 08/02/2014  . Right shoulder pain 07/29/2014  . Benign neoplasm of colon 06/30/2010  . Right upper quadrant pain 06/30/2010   Pura Spice, PT, DPT # [phone] 05/27/2020, 7:08 PM  [place] [facility] [address] [place], [place], [address] Phone: [phone]   Fax:  [phone]  Name: [name] MRN: [id number] Date of Birth: September 29, 1945

## 2020-05-30 ENCOUNTER — Ambulatory Visit: Payer: Medicare Other | Admitting: Occupational Therapy

## 2020-05-30 ENCOUNTER — Other Ambulatory Visit: Payer: Self-pay

## 2020-05-30 DIAGNOSIS — I890 Lymphedema, not elsewhere classified: Secondary | ICD-10-CM

## 2020-05-31 ENCOUNTER — Ambulatory Visit: Payer: Medicare Other | Admitting: Physical Therapy

## 2020-05-31 ENCOUNTER — Other Ambulatory Visit: Payer: Self-pay

## 2020-05-31 DIAGNOSIS — G8929 Other chronic pain: Secondary | ICD-10-CM

## 2020-05-31 DIAGNOSIS — M25561 Pain in right knee: Secondary | ICD-10-CM

## 2020-05-31 DIAGNOSIS — M545 Low back pain, unspecified: Secondary | ICD-10-CM

## 2020-05-31 DIAGNOSIS — M6281 Muscle weakness (generalized): Secondary | ICD-10-CM

## 2020-05-31 DIAGNOSIS — M25661 Stiffness of right knee, not elsewhere classified: Secondary | ICD-10-CM

## 2020-05-31 NOTE — Therapy (Addendum)
[place] MAIN [facility] SERVICES [address] [place], [place], [address] Phone: [phone]   Fax:  [phone]  Occupational Therapy Treatment  Patient Details  Name: [name] MRN: [id number] Date of Birth: 1945/11/19 Referring Provider (OT): Ida Rogue, MD   Encounter Date: 05/30/2020   OT End of Session - 05/31/20 1208    Visit Number 26    Number of Visits 36    Date for OT Re-Evaluation 08/07/20    OT Start Time 0205    OT Stop Time 0310    OT Time Calculation (min) 65 min    Activity Tolerance Patient tolerated treatment well;No increased pain;Patient limited by pain    Behavior During Therapy [facility] for tasks assessed/performed           Past Medical History:  Diagnosis Date  . Anterolisthesis    Cervical spine  . Asthma   . Connective tissue disorder (HCC)    recurrent carotid arteritis, temporal arteritis, vasculitis mandible, general hepatitis, avascular necrosis bilat  . DDD (degenerative disc disease), lumbar   . Diabetes mellitus without complication ([place])   . Diverticulosis   . Duodenitis   . Gouty arthritis   . Hiatal hernia with GERD   . History of cardiovascular stress test    a. 07/2018 [facility]): Fixed inferoapical defect w/ nl contraction-->attenuation artifact. No ischemia. EF 63%.  . Hyperlipidemia   . Hypertension    a. 02/2019 Renal artery duplex: no evidence of prox RAS.  Marland Kitchen Hyperuricemia   . Keratitis sicca, bilateral ([place])   . Lymphedema of both lower extremities   . Osteoarthritis   . Pericarditis    Recurrent: Aug 97, July 05, Sept 08, July 16, July 18  . PUD (peptic ulcer disease)   . 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[place])   . Sjogren's syndrome ([place])   . Syncope   . Tendonitis, Achilles, right   . Tortuous colon   . Trigger finger     Past Surgical History:  Procedure Laterality Date  . ABDOMINAL HYSTERECTOMY    . BREAST BIOPSY    . BREAST EXCISIONAL BIOPSY Left 1986   neg  .  CHOLECYSTECTOMY    . COLONOSCOPY WITH PROPOFOL N/A 08/26/2018   Procedure: COLONOSCOPY WITH PROPOFOL;  Surgeon: Lucilla Lame, MD;  Location: [facility] ENDOSCOPY;  Service: Endoscopy;  Laterality: N/A;  . CYSTOSCOPY  02/26/2018   [name], MD   . distal arthrectomy    . ESOPHAGOGASTRODUODENOSCOPY (EGD) WITH PROPOFOL N/A 08/26/2018   Procedure: ESOPHAGOGASTRODUODENOSCOPY (EGD) WITH PROPOFOL;  Surgeon: Lucilla Lame, MD;  Location: [facility] ENDOSCOPY;  Service: Endoscopy;  Laterality: N/A;  . EXCISION NEUROMA    . KNEE SURGERY Bilateral    1985, 2001, 11/2002, 12/2006  . panhysterectomy  10/1983  . SHOULDER SURGERY Right    reverse total arthroplasty w/ biceps tenodesis  . TONSILLECTOMY AND ADENOIDECTOMY    . TOTAL HIP ARTHROPLASTY Right 04/2007  . WISDOM TOOTH EXTRACTION      There were no vitals filed for this visit.   Subjective Assessment - 05/31/20 1207    Subjective  [name] presents for OT Rx visit  26/36 to address BLE lymphedema. Pt presents with gradient compression wraps in place on LLE. Pt denies LE related pain this afternoon.    Pertinent History PMHx extensive; Dx contributing to leg swelling include OA, B knee and R hip arthroplasties, asthma, HTN. Suspect LE venous insufficiency.    Limitations chronic pain, chronic leg swelling, generalized weakness,  kyphotic posture    Repetition Increases Symptoms    Special Tests + stemmer sign bilaterally base of toes    Patient Stated Goals Reduce LE edema    Pain Onset --   30 yrs                       OT Treatments/Exercises (OP) - 05/31/20 0001      ADLs   ADL Education Given Yes      Manual Therapy   Manual Therapy Edema management;Manual Lymphatic Drainage (MLD);Compression Bandaging    Manual Lymphatic Drainage (MLD) MLD to RLE as established    Compression Bandaging LLE short stretch knee length wraps as established                  OT Education - 05/31/20 1207    Education Details Continued  skilled Pt/caregiver education  And LE ADL training throughout visit for lymphedema self care/ home program, including compression wrapping, compression garment and device wear/care, lymphatic pumping ther ex, simple self-MLD, and skin care. Discussed progress towards goals.    Person(s) Educated Patient    Methods Explanation;Demonstration;Handout    Comprehension Verbalized understanding;Returned demonstration               OT Long Term Goals - 03/28/20 1421      OT LONG TERM GOAL #1   Title With modified independent (extra time) Pt will be able to apply knee length, multi-layer, short stretch compression wraps daily from ankle to tibial tuberosity on one leg at a time using correct gradient techniques to return affected limb/s, as closely as possible, to premorbid size and shape, to limit leg pain and infection risk, and to improve safe functional mobility and ADLs performance.    Baseline Dependent    Time 4    Period Days    Status Achieved   achieved 4th visit     OT LONG TERM GOAL #2   Title Pt will be able to verbalize signs and symptoms of cellulitis infection and identify lymphedema precautions using printed resource (modified independence) for reference to decrease infection risk and limit LE progression over time.    Baseline Max A    Time 4    Period Days    Status Achieved      OT LONG TERM GOAL #3   Title Pt will sustain a least 85% compliance with all daily LE self-care home program components throughout Intensive Phase CDT, including impeccable skin care, lymphatic pumping ther ex,  compression wraps and simple self MLD, to ensure optimal limb volume reduction, to limit infection risk and to limit LE progression.    Baseline dependent    Time 12    Period Weeks    Status Achieved   "I'm 98% !"     OT LONG TERM GOAL #4   Title Using assistive devices (modified independence)  Pt will be able to don and doff appropriate daytime compression garments to limit edema  re-accumulation and further progression by issue date.    Baseline Max A    Time 12    Period Weeks    Status On-going      OT LONG TERM GOAL #5   Title After skilled teaching Pt  will be able to perform all lymphedema self-care home program components with modified independence (extra time, assistive devices PRN) , including simple self MLD, lymphatic pumping exercises, don, doff and wear compression garments  daily, sustain   impeccable skin  care daily, to limit lymphedema progression and infection risk.    Baseline Max A    Time 12    Period Weeks    Status On-going   Pt is trained for comrpession wrapping skin care and ther ex. She is still working on learning simple self MLD as of 03/21/20     OT LONG TERM GOAL #6   Title Pt will increase score on FOTO 10 points, from 45 to 55/100, to improve functional performance of basic ADLs.    Baseline Max A    Time 12    Period Weeks    Status On-going                 Plan - 05/31/20 1209    Clinical Impression Statement Pt tolerated MLD, skin care and gradient compression wrap to LLE with no increased pain. Pt continues to present with fluctuating swelling bilaterally, which is suspicious for systemic origin causing lymphatic overload. Pt reports BP has been difficult to control lately. We'll fit custom compression knee high as soon as re-make is delivered, then commence CDT to RLE with garment fitting ASAP. Cont as per POC.           OT Occupational Profile and History Comprehensive Assessment- Review of records and extensive additional review of physical, cognitive, psychosocial history related to current functional performance   Occupational performance deficits (Please refer to evaluation for details): ADL's;IADL's;Social Participation;Work;Leisure;Other   body image  Body Structure / Function / Physical Skills ADL;Decreased knowledge of precautions;ROM;Balance;Decreased knowledge of use of DME;Mobility;Edema;Skin  integrity;Pain;IADL   Rehab Potential Good   Clinical Decision Making Several treatment options, min-mod task modification necessary   Comorbidities Affecting Occupational Performance: Presence of comorbidities impacting occupational performance   Modification or Assistance to Complete Evaluation  Min-Moderate modification of tasks or assist with assess necessary to complete eval   OT Frequency 2x / week   OT Duration 12 weeks   and PRN  OT Treatment/Interventions Self-care/ADL training;Therapeutic exercise;Manual lymph drainage;Compression bandaging;Patient/family education;Other (comment);Therapeutic activities;DME and/or AE instruction;Manual Therapy   Plan Intensive Phase CDT: 1 leg at a time to limit fall risk. MLD, skin care, ther ex , cimpression wraps -> daytime garments, HOS device, consider Flexitouch if appropriate   Recommended Other Services Fit w/ appropriate compression garments that are effective, comfortable and  Pt can don using AE and extra time   Consulted and Agree with Plan of Care Patient         Patient will benefit from skilled therapeutic intervention in order to improve the following deficits and impairments:           Visit Diagnosis: Lymphedema, not elsewhere classified    Problem List Patient Active Problem List   Diagnosis Date Noted  . Nausea 03/31/2020  . Multiple thyroid nodules 12/28/2019  . Thyroid nodule 09/29/2019  . Lymphedema 11/10/2018  . PAD (peripheral artery disease) ([place]) 10/12/2018  . Aortic atherosclerosis ([place]) 10/12/2018  . Carotid stenosis, asymptomatic, bilateral 10/12/2018  . Positive colorectal cancer screening using Cologuard test 10/09/2018  . Gastroesophageal reflux disease   . Acute gastritis without hemorrhage   . Osteoarthritis 04/29/2018  . Hiatal hernia with GERD 04/29/2018  . Hyperlipidemia 04/29/2018  . Lymphedema of both lower extremities 04/29/2018  . Left knee pain 10/23/2016  . Cataracts, bilateral  07/31/2016  . Glaucoma 07/31/2016  . Lateral epicondylitis of left elbow 10/20/2015  . Vitamin D deficiency 10/20/2015  . Type 2 diabetes, controlled, with neuropathy ([place]) 10/16/2015  .  Gouty arthritis 06/03/2015  . H/O syncope 06/03/2015  . Mild intermittent asthma 06/03/2015  . Multiple gastric ulcers 06/03/2015  . Schatzki's ring 06/03/2015  . Undifferentiated connective tissue disease ([place]) 06/03/2015  . Bone disorder 04/05/2015  . Dental injury 04/05/2015  . Pericarditis 04/05/2015  . Sjogren's syndrome ([place]) 09/07/2014  . Elevated alkaline phosphatase level 08/24/2014  . Lactose intolerance 08/24/2014  . Obesity 08/24/2014  . Peripheral edema 08/24/2014  . 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 Abdominal adhesions 08/02/2014  . 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[place]) 08/02/2014  . Degenerative joint disease (DJD) of lumbar spine 08/02/2014  . Diverticulosis of both small and large intestine 08/02/2014  . Hyperuricemia 08/02/2014  . Pure hypercholesterolemia 08/02/2014  . Trigger finger 08/02/2014  . Right shoulder pain 07/29/2014  . Benign neoplasm of colon 06/30/2010  . Right upper quadrant pain 06/30/2010   Andrey Spearman, MS, OTR/L, [facility] 05/31/20 12:14 PM    [place] MAIN [facility] SERVICES [address] [place], [place], [address] Phone: [phone]   Fax:  [phone]  Name: [name] MRN: [id number] Date of Birth: [birth year]

## 2020-06-01 ENCOUNTER — Ambulatory Visit: Payer: Medicare Other | Admitting: Occupational Therapy

## 2020-06-01 DIAGNOSIS — I890 Lymphedema, not elsewhere classified: Secondary | ICD-10-CM

## 2020-06-01 NOTE — Therapy (Addendum)
[place] MAIN [facility] SERVICES [address] [place], [place], [address] Phone: [phone]   Fax:  [phone]  Occupational Therapy Treatment Note  Patient Details  Name: [name] MRN: [id number] Date of Birth: [birth year] Referring Provider (OT): Ida Rogue, MD   Encounter Date: 06/01/2020   OT End of Session - 06/01/20 1441    Visit Number 27   Number of Visits 36    Date for OT Re-Evaluation 08/07/20    OT Start Time 0213    OT Stop Time 0250    OT Time Calculation (min) 37 min    Activity Tolerance Patient tolerated treatment well;No increased pain;Patient limited by pain    Behavior During Therapy [facility] for tasks assessed/performed           Past Medical History:  Diagnosis Date  . Anterolisthesis    Cervical spine  . Asthma   . Connective tissue disorder (HCC)    recurrent carotid arteritis, temporal arteritis, vasculitis mandible, general hepatitis, avascular necrosis bilat  . DDD (degenerative disc disease), lumbar   . Diabetes mellitus without complication ([place])   . Diverticulosis   . Duodenitis   . Gouty arthritis   . Hiatal hernia with GERD   . History of cardiovascular stress test    a. 07/2018 [facility]): Fixed inferoapical defect w/ nl contraction-->attenuation artifact. No ischemia. EF 63%.  . Hyperlipidemia   . Hypertension    a. 02/2019 Renal artery duplex: no evidence of prox RAS.  Marland Kitchen Hyperuricemia   . Keratitis sicca, bilateral ([place])   . Lymphedema of both lower extremities   . Osteoarthritis   . Pericarditis    Recurrent: Aug 97, July 05, Sept 08, July 16, July 18  . PUD (peptic ulcer disease)   . 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[place])   . Sjogren's syndrome ([place])   . Syncope   . Tendonitis, Achilles, right   . Tortuous colon   . Trigger finger     Past Surgical History:  Procedure Laterality Date  . ABDOMINAL HYSTERECTOMY    . BREAST BIOPSY    . BREAST EXCISIONAL BIOPSY Left 1986   neg  .  CHOLECYSTECTOMY    . COLONOSCOPY WITH PROPOFOL N/A 08/26/2018   Procedure: COLONOSCOPY WITH PROPOFOL;  Surgeon: Lucilla Lame, MD;  Location: [facility] ENDOSCOPY;  Service: Endoscopy;  Laterality: N/A;  . CYSTOSCOPY  02/26/2018   [name], MD   . distal arthrectomy    . ESOPHAGOGASTRODUODENOSCOPY (EGD) WITH PROPOFOL N/A 08/26/2018   Procedure: ESOPHAGOGASTRODUODENOSCOPY (EGD) WITH PROPOFOL;  Surgeon: Lucilla Lame, MD;  Location: [facility] ENDOSCOPY;  Service: Endoscopy;  Laterality: N/A;  . EXCISION NEUROMA    . KNEE SURGERY Bilateral    1985, 2001, 11/2002, 12/2006  . panhysterectomy  10/1983  . SHOULDER SURGERY Right    reverse total arthroplasty w/ biceps tenodesis  . TONSILLECTOMY AND ADENOIDECTOMY    . TOTAL HIP ARTHROPLASTY Right 04/2007  . WISDOM TOOTH EXTRACTION      There were no vitals filed for this visit.   Subjective Assessment - 06/01/20 1426    Subjective  [name] presents for OT Rx visit  27/36 to address BLE lymphedema. Pt's custom compression garment remake has arrived frmo vendor and is ready for fitting. .    Pertinent History PMHx extensive; Dx contributing to leg swelling include OA, B knee and R hip arthroplasties, asthma, HTN. Suspect LE venous insufficiency.    Limitations chronic pain, chronic leg swelling, generalized weakness, kyphotic posture  Repetition Increases Symptoms    Special Tests + stemmer sign bilaterally base of toes    Patient Stated Goals Reduce LE edema    Pain Onset --   30 yrs                       OT Treatments/Exercises (OP) - 06/01/20 0001      ADLs   ADL Education Given Yes      Manual Therapy   Manual Therapy Edema management    Edema Management garment fitting for LLE    Compression Bandaging no wraps today                  OT Education - 06/01/20 1440    Education Details Skilled LE self care training for wear and care regimes for custom elastic compression garments fitted today. Provided  training  for donning and doffing garments using assistive devices (friction gloves and nylon/tyvek sock).    Person(s) Educated Patient    Methods Explanation;Demonstration;Handout    Comprehension Verbalized understanding;Returned demonstration               OT Long Term Goals - 06/01/20 1441      OT LONG TERM GOAL #1   Title With modified independent (extra time) Pt will be able to apply knee length, multi-layer, short stretch compression wraps daily from ankle to tibial tuberosity on one leg at a time using correct gradient techniques to return affected limb/s, as closely as possible, to premorbid size and shape, to limit leg pain and infection risk, and to improve safe functional mobility and ADLs performance.    Baseline Dependent    Time 4    Period Days    Status Achieved   achieved 4th visit     OT LONG TERM GOAL #2   Title Pt will be able to verbalize signs and symptoms of cellulitis infection and identify lymphedema precautions using printed resource (modified independence) for reference to decrease infection risk and limit LE progression over time.    Baseline Max A    Time 4    Period Days    Status Achieved      OT LONG TERM GOAL #3   Title Pt will sustain a least 85% compliance with all daily LE self-care home program components throughout Intensive Phase CDT, including impeccable skin care, lymphatic pumping ther ex,  compression wraps and simple self MLD, to ensure optimal limb volume reduction, to limit infection risk and to limit LE progression.    Baseline dependent    Time 12    Period Weeks    Status Achieved   "I'm 98% !"     OT LONG TERM GOAL #4   Title Using assistive devices (modified independence)  Pt will be able to don and doff appropriate daytime compression garments to limit edema re-accumulation and further progression by issue date.    Baseline Max A    Time 12    Period Weeks    Status Achieved      OT LONG TERM GOAL #5   Title After skilled teaching  Pt  will be able to perform all lymphedema self-care home program components with modified independence (extra time, assistive devices PRN) , including simple self MLD, lymphatic pumping exercises, don, doff and wear compression garments  daily, sustain   impeccable skin care daily, to limit lymphedema progression and infection risk.    Baseline Max A    Time 12  Period Weeks    Status Achieved   Pt is trained for comrpession wrapping skin care and ther ex. She is still working on learning simple self MLD as of 03/21/20     OT LONG TERM GOAL #6   Title Pt will increase score on FOTO 10 points, from 45 to 55/100, to improve functional performance of basic ADLs.    Baseline Max A    Time 12    Period Weeks    Status On-going                 Plan - 06/01/20 1456    Clinical Impression Statement See Long Term Goals section with progress todate. Pt continues to make progress towardas all remaining goals and condition of her legs continues to improve. Completed fitting of custom LLE compression garment today. Garment appears to fit well and Pt more comfortable in this softer fabric w lower compression. We'll assess over the next week for containments. Pt taking a break from wraps until we resume CDT to RLE after Xmas.Cont as per POC.          OT Occupational Profile and History Comprehensive Assessment- Review of records and extensive additional review of physical, cognitive, psychosocial history related to current functional performance   Occupational performance deficits (Please refer to evaluation for details): ADL's;IADL's;Social Participation;Work;Leisure;Other   body image  Body Structure / Function / Physical Skills ADL;Decreased knowledge of precautions;ROM;Balance;Decreased knowledge of use of DME;Mobility;Edema;Skin integrity;Pain;IADL   Rehab Potential Good   Clinical Decision Making Several treatment options, min-mod task modification necessary   Comorbidities Affecting  Occupational Performance: Presence of comorbidities impacting occupational performance   Modification or Assistance to Complete Evaluation  Min-Moderate modification of tasks or assist with assess necessary to complete eval   OT Frequency 2x / week   OT Duration 12 weeks   and PRN  OT Treatment/Interventions Self-care/ADL training;Therapeutic exercise;Manual lymph drainage;Compression bandaging;Patient/family education;Other (comment);Therapeutic activities;DME and/or AE instruction;Manual Therapy   Plan Intensive Phase CDT: 1 leg at a time to limit fall risk. MLD, skin care, ther ex , cimpression wraps -> daytime garments, HOS device, consider Flexitouch if appropriate   Recommended Other Services Fit w/ appropriate compression garments that are effective, comfortable and  Pt can don using AE and extra time   Consulted and Agree with Plan of Care Patient         Patient will benefit from skilled therapeutic intervention in order to improve the following deficits and impairments:           Visit Diagnosis: Lymphedema, not elsewhere classified    Problem List Patient Active Problem List   Diagnosis Date Noted  . Nausea 03/31/2020  . Multiple thyroid nodules 12/28/2019  . Thyroid nodule 09/29/2019  . Lymphedema 11/10/2018  . PAD (peripheral artery disease) ([place]) 10/12/2018  . Aortic atherosclerosis ([place]) 10/12/2018  . Carotid stenosis, asymptomatic, bilateral 10/12/2018  . Positive colorectal cancer screening using Cologuard test 10/09/2018  . Gastroesophageal reflux disease   . Acute gastritis without hemorrhage   . Osteoarthritis 04/29/2018  . Hiatal hernia with GERD 04/29/2018  . Hyperlipidemia 04/29/2018  . Lymphedema of both lower extremities 04/29/2018  . Left knee pain 10/23/2016  . Cataracts, bilateral 07/31/2016  . Glaucoma 07/31/2016  . Lateral epicondylitis of left elbow 10/20/2015  . Vitamin D deficiency 10/20/2015  . Type 2 diabetes, controlled, with  neuropathy ([place]) 10/16/2015  . Gouty arthritis 06/03/2015  . H/O syncope 06/03/2015  . Mild intermittent asthma 06/03/2015  .  Multiple gastric ulcers 06/03/2015  . Schatzki's ring 06/03/2015  . Undifferentiated connective tissue disease ([place]) 06/03/2015  . Bone disorder 04/05/2015  . Dental injury 04/05/2015  . Pericarditis 04/05/2015  . Sjogren's syndrome ([place]) 09/07/2014  . Elevated alkaline phosphatase level 08/24/2014  . Lactose intolerance 08/24/2014  . Obesity 08/24/2014  . Peripheral edema 08/24/2014  . 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 Abdominal adhesions 08/02/2014  . 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[place]) 08/02/2014  . Degenerative joint disease (DJD) of lumbar spine 08/02/2014  . Diverticulosis of both small and large intestine 08/02/2014  . Hyperuricemia 08/02/2014  . Pure hypercholesterolemia 08/02/2014  . Trigger finger 08/02/2014  . Right shoulder pain 07/29/2014  . Benign neoplasm of colon 06/30/2010  . Right upper quadrant pain 06/30/2010    Andrey Spearman, MS, OTR/L, [facility] 06/01/20 4:13 PM   [place] MAIN [facility] SERVICES [address] [place], [place], [address] Phone: [phone]   Fax:  [phone]  Name: Natasa Stigall MRN: [id number] Date of Birth: [birth year]

## 2020-06-02 ENCOUNTER — Ambulatory Visit: Payer: Medicare Other | Admitting: Physical Therapy

## 2020-06-02 ENCOUNTER — Other Ambulatory Visit: Payer: Self-pay

## 2020-06-02 DIAGNOSIS — M25661 Stiffness of right knee, not elsewhere classified: Secondary | ICD-10-CM

## 2020-06-02 DIAGNOSIS — M6281 Muscle weakness (generalized): Secondary | ICD-10-CM

## 2020-06-02 DIAGNOSIS — G8929 Other chronic pain: Secondary | ICD-10-CM

## 2020-06-02 DIAGNOSIS — M25561 Pain in right knee: Secondary | ICD-10-CM

## 2020-06-02 DIAGNOSIS — M545 Low back pain, unspecified: Secondary | ICD-10-CM

## 2020-06-02 NOTE — Therapy (Signed)
[place] [facility] [address] [place], [place], [address] Phone: [phone]   Fax:  [phone]  Physical Therapy Treatment  Patient Details  Name: [name] MRN: [id number] Date of Birth: Oct 26, 1945 Referring Provider (PT): Dr. Harlow Mares   Encounter Date: 05/31/2020   PT End of Session - 06/02/20 1235    Visit Number 31    Number of Visits 38    Date for PT Re-Evaluation 06/16/20    Authorization - Visit Number 9    Authorization - Number of Visits 10    PT Start Time 2409    PT Stop Time 1526    PT Time Calculation (min) 52 min    Activity Tolerance Patient tolerated treatment well    Behavior During Therapy [facility] for tasks assessed/performed           Past Medical History:  Diagnosis Date  . Anterolisthesis    Cervical spine  . Asthma   . Connective tissue disorder (HCC)    recurrent carotid arteritis, temporal arteritis, vasculitis mandible, general hepatitis, avascular necrosis bilat  . DDD (degenerative disc disease), lumbar   . Diabetes mellitus without complication ([place])   . Diverticulosis   . Duodenitis   . Gouty arthritis   . Hiatal hernia with GERD   . History of cardiovascular stress test    a. 07/2018 [facility]): Fixed inferoapical defect w/ nl contraction-->attenuation artifact. No ischemia. EF 63%.  . Hyperlipidemia   . Hypertension    a. 02/2019 Renal artery duplex: no evidence of prox RAS.  Marland Kitchen Hyperuricemia   . Keratitis sicca, bilateral ([place])   . Lymphedema of both lower extremities   . Osteoarthritis   . Pericarditis    Recurrent: Aug 97, July 05, Sept 08, July 16, July 18  . PUD (peptic ulcer disease)   . 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[place])   . Sjogren's syndrome ([place])   . Syncope   . Tendonitis, Achilles, right   . Tortuous colon   . Trigger finger     Past Surgical History:  Procedure Laterality Date  . ABDOMINAL HYSTERECTOMY    . BREAST BIOPSY    . BREAST EXCISIONAL BIOPSY Left 1986    neg  . CHOLECYSTECTOMY    . COLONOSCOPY WITH PROPOFOL N/A 08/26/2018   Procedure: COLONOSCOPY WITH PROPOFOL;  Surgeon: Lucilla Lame, MD;  Location: [facility] ENDOSCOPY;  Service: Endoscopy;  Laterality: N/A;  . CYSTOSCOPY  02/26/2018   [name], MD   . distal arthrectomy    . ESOPHAGOGASTRODUODENOSCOPY (EGD) WITH PROPOFOL N/A 08/26/2018   Procedure: ESOPHAGOGASTRODUODENOSCOPY (EGD) WITH PROPOFOL;  Surgeon: Lucilla Lame, MD;  Location[facility] ENDOSCOPY;  Service: Endoscopy;  Laterality: N/A;  . EXCISION NEUROMA    . KNEE SURGERY Bilateral    1985, 2001, 11/2002, 12/2006  . panhysterectomy  10/1983  . SHOULDER SURGERY Right    reverse total arthroplasty w/ biceps tenodesis  . TONSILLECTOMY AND ADENOIDECTOMY    . TOTAL HIP ARTHROPLASTY Right 04/2007  . WISDOM TOOTH EXTRACTION      There were no vitals filed for this visit.   Subjective Assessment - 06/02/20 1233    Subjective R lower leg swelling/ lymphadema remains.  Pt. has not started consistent treatment on L LE until R lymphadema sleeve arrives.  Pt. reports discomfort in back/ R knee.    Pertinent History Pt. from [place] and came to [place] to take care of friend and then Covid happened.  Pt. retired.  Pts. ex-husband lives in  her house in [place].  R shoulder limitations (5# lifting restrictions).  Pt. went to [employer] for 10 years (every week) with benefits reported in low back.    Limitations Standing;Walking;Writing    Patient Stated Goals Decrease back pain/ improve strength/ mobility.    Currently in Pain? Yes    Pain Score 2     Pain Location Knee    Pain Orientation Right    Pain Onset --   30 yrs            There.ex.:  Nustep L3 10 min. B LE (consistent cadence)- discussed daily activity  Walking in //-bars with high marching/ lateral 4x each.  STS with UE assist required 5x (mirorr feedback).    Walking in PT clinic with posture feedback/ consistent step pattern.   4# ankle wts.: Standing hip ex. (flexion/ abduction/ knee  flexion/ hip extension)- 20x  Manual tx.:  Prone position: STM to mid-thoracic/ lumbar paraspinals.  Grade I-II PA mobs-  Unilateral L/R from T2 to T12 spine.  Pt. Has moderate hypomobility but tolerated well and reports feeling slightly better in back after returning to standing position.        PT Long Term Goals - 04/21/20 1631      PT LONG TERM GOAL #1   Title Pt. will improve FOTO to predicted score to demonstrate improvement in pain free functional mobility.    Baseline 11/11: TBD    Time 8    Period Weeks    Status New    Target Date 06/16/20      PT LONG TERM GOAL #2   Title Pt. will improve R knee flexion AROM to at least 120 deg to improve stair climbing ability without compensations.    Baseline 11/11: R knee flex AROM: 105 deg    Time 8    Period Weeks    Status New    Target Date 06/16/20      PT LONG TERM GOAL #3   Title Pt. will improve R knee flexion strength to 5/5 without pain to improve pain free functional mobility.    Baseline 11/11: R knee flex: 4+/5 with pain    Time 8    Period Weeks    Status New    Target Date 06/16/20                 Plan - 06/02/20 1236    Clinical Impression Statement PT continues generalized LE strengthening ex. program with use of //-bars for light UE assist to decrease back/ knee pain.  Pt. continues to require UE assist with proper/ safe sit to stands.  Pt. requires extra time/ assist to go from sitting to prone position on mat table.  Pt. did well during prone STM/ grade I-II PA mobs. from upper to low thoracic spine.  Moderate hypomobility noted in spine during manual tx.    Personal Factors and Comorbidities Education;Comorbidity 3+    Comorbidities AAA, AVN, HTN, severe LE edema bilat (L>R)    Examination-Activity Limitations Locomotion Level;Squat;Stairs;Stand    Examination-Participation Restrictions Yard Work;Driving;Community Activity    Stability/Clinical Decision Making Evolving/Moderate complexity     Clinical Decision Making Moderate    Rehab Potential Fair    PT Frequency 2x / week    PT Duration 8 weeks    PT Treatment/Interventions ADLs/Self Care Home Management;Electrical Stimulation;Moist Heat;Gait training;Stair training;Functional mobility training;Neuromuscular re-education;Balance training;Therapeutic exercise;Therapeutic activities;Patient/family education;Manual techniques;Passive range of motion    PT Next Visit Plan Check schedule over holiday  and discuss certification dates.    Consulted and Agree with Plan of Care Patient           Patient will benefit from skilled therapeutic intervention in order to improve the following deficits and impairments:  Abnormal gait,Pain,Improper body mechanics,Postural dysfunction,Decreased mobility,Decreased activity tolerance,Decreased endurance,Decreased range of motion,Decreased strength,Impaired UE functional use,Impaired flexibility,Difficulty walking  Visit Diagnosis: Muscle weakness (generalized)  Right knee pain, unspecified chronicity  Decreased range of motion of right knee  Chronic bilateral low back pain without sciatica     Problem List Patient Active Problem List   Diagnosis Date Noted  . Nausea 03/31/2020  . Multiple thyroid nodules 12/28/2019  . Thyroid nodule 09/29/2019  . Lymphedema 11/10/2018  . PAD (peripheral artery disease) ([place]) 10/12/2018  . Aortic atherosclerosis ([place]) 10/12/2018  . Carotid stenosis, asymptomatic, bilateral 10/12/2018  . Positive colorectal cancer screening using Cologuard test 10/09/2018  . Gastroesophageal reflux disease   . Acute gastritis without hemorrhage   . Osteoarthritis 04/29/2018  . Hiatal hernia with GERD 04/29/2018  . Hyperlipidemia 04/29/2018  . Lymphedema of both lower extremities 04/29/2018  . Left knee pain 10/23/2016  . Cataracts, bilateral 07/31/2016  . Glaucoma 07/31/2016  . Lateral epicondylitis of left elbow 10/20/2015  . Vitamin D deficiency 10/20/2015   . Type 2 diabetes, controlled, with neuropathy ([place]) 10/16/2015  . Gouty arthritis 06/03/2015  . H/O syncope 06/03/2015  . Mild intermittent asthma 06/03/2015  . Multiple gastric ulcers 06/03/2015  . Schatzki's ring 06/03/2015  . Undifferentiated connective tissue disease ([place]) 06/03/2015  . Bone disorder 04/05/2015  . Dental injury 04/05/2015  . Pericarditis 04/05/2015  . Sjogren's syndrome ([place]) 09/07/2014  . Elevated alkaline phosphatase level 08/24/2014  . Lactose intolerance 08/24/2014  . Obesity 08/24/2014  . Peripheral edema 08/24/2014  . 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 Abdominal adhesions 08/02/2014  . 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[place]) 08/02/2014  . Degenerative joint disease (DJD) of lumbar spine 08/02/2014  . Diverticulosis of both small and large intestine 08/02/2014  . Hyperuricemia 08/02/2014  . Pure hypercholesterolemia 08/02/2014  . Trigger finger 08/02/2014  . Right shoulder pain 07/29/2014  . Benign neoplasm of colon 06/30/2010  . Right upper quadrant pain 06/30/2010   Pura Spice, PT, DPT # [phone] 06/02/2020, 12:42 PM  [facility] [address] [place], [place], [address] Phone: [phone]   Fax:  [phone]  Name: [name] MRN: [id number] Date of Birth: 1945-08-29

## 2020-06-05 NOTE — Therapy (Signed)
[facility] [address] [place], [place], [address] Phone: [phone]   Fax:  [phone]  Physical Therapy Treatment  Patient Details  Name: [name] MRN: [id number] Date of Birth: 06-12-1945 Referring Provider (PT): Dr. Harlow Mares   Encounter Date: 06/02/2020   PT End of Session - 06/05/20 0643    Visit Number 32    Number of Visits 38    Date for PT Re-Evaluation 06/16/20    Authorization - Visit Number 9    Authorization - Number of Visits 10    PT Start Time 5732    PT Stop Time 1522    PT Time Calculation (min) 47 min    Activity Tolerance Patient tolerated treatment well    Behavior During Therapy [facility] for tasks assessed/performed           Past Medical History:  Diagnosis Date  . Anterolisthesis    Cervical spine  . Asthma   . Connective tissue disorder (HCC)    recurrent carotid arteritis, temporal arteritis, vasculitis mandible, general hepatitis, avascular necrosis bilat  . DDD (degenerative disc disease), lumbar   . Diabetes mellitus without complication ([place])   . Diverticulosis   . Duodenitis   . Gouty arthritis   . Hiatal hernia with GERD   . History of cardiovascular stress test    a. 07/2018 [facility]): Fixed inferoapical defect w/ nl contraction-->attenuation artifact. No ischemia. EF 63%.  . Hyperlipidemia   . Hypertension    a. 02/2019 Renal artery duplex: no evidence of prox RAS.  Marland Kitchen Hyperuricemia   . Keratitis sicca, bilateral ([place])   . Lymphedema of both lower extremities   . Osteoarthritis   . Pericarditis    Recurrent: Aug 97, July 05, Sept 08, July 16, July 18  . PUD (peptic ulcer disease)   . 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[place])   . Sjogren's syndrome ([place])   . Syncope   . Tendonitis, Achilles, right   . Tortuous colon   . Trigger finger     Past Surgical History:  Procedure Laterality Date  . ABDOMINAL HYSTERECTOMY    . BREAST BIOPSY    . BREAST EXCISIONAL BIOPSY Left 1986    neg  . CHOLECYSTECTOMY    . COLONOSCOPY WITH PROPOFOL N/A 08/26/2018   Procedure: COLONOSCOPY WITH PROPOFOL;  Surgeon: Lucilla Lame, MD;  Location: [facility] ENDOSCOPY;  Service: Endoscopy;  Laterality: N/A;  . CYSTOSCOPY  02/26/2018   [name], MD   . distal arthrectomy    . ESOPHAGOGASTRODUODENOSCOPY (EGD) WITH PROPOFOL N/A 08/26/2018   Procedure: ESOPHAGOGASTRODUODENOSCOPY (EGD) WITH PROPOFOL;  Surgeon: Lucilla Lame, MD;  Location: [facility] ENDOSCOPY;  Service: Endoscopy;  Laterality: N/A;  . EXCISION NEUROMA    . KNEE SURGERY Bilateral    1985, 2001, 11/2002, 12/2006  . panhysterectomy  10/1983  . SHOULDER SURGERY Right    reverse total arthroplasty w/ biceps tenodesis  . TONSILLECTOMY AND ADENOIDECTOMY    . TOTAL HIP ARTHROPLASTY Right 04/2007  . WISDOM TOOTH EXTRACTION      There were no vitals filed for this visit.    Pt. received R lymphadema custom brace and is wearing shoes on both feet today.          There.ex.:  Nustep L3 10 min. B LE (consistent cadence)- discussed daily activity  4# ankle wts.: Standing hip ex. (flexion/ abduction/ knee flexion/ hip extension)- 20x  Walking in //-bars with high marching/ lateral 5x each.Walking in PT clinic with posture feedback/  consistent step pattern.     Walking partial lunges in //-bars 10x L/R (cuing to slow down/ increase static hold time).    Supine SAQ (no wt. Due to increase knee pain)/ marching/ heel slides 20x. Each.   Manual tx.:  Supine knee to chest/ piriformis stretches 3x each.  Supine lumbar rotn. 2x each.    Prone position: STM to mid-thoracic/ lumbar paraspinals. Grade I-II PA mobs- Unilateral L/R from T2 to T12 spine. Pt. Able to return to sitting on edge of mat with no assist.       PT Long Term Goals - 04/21/20 1631      PT LONG TERM GOAL #1   Title Pt. will improve FOTO to predicted score to demonstrate improvement in pain free functional mobility.    Baseline 11/11: TBD    Time 8     Period Weeks    Status New    Target Date 06/16/20      PT LONG TERM GOAL #2   Title Pt. will improve R knee flexion AROM to at least 120 deg to improve stair climbing ability without compensations.    Baseline 11/11: R knee flex AROM: 105 deg    Time 8    Period Weeks    Status New    Target Date 06/16/20      PT LONG TERM GOAL #3   Title Pt. will improve R knee flexion strength to 5/5 without pain to improve pain free functional mobility.    Baseline 11/11: R knee flex: 4+/5 with pain    Time 8    Period Weeks    Status New    Target Date 06/16/20                 Plan - 06/05/20 0645    Clinical Impression Statement Marked improvement in gait pattern while entering PT and completing standing ther.ex. due to have 2 shoes on.  Pt. reports her back is feeling better after last 2 manual tx. sessions (prone mobs).  No assist required while getting into prone position and returning to sitting on edge on mat table.  PT will reassess all goals next tx. and determine POC/ HEP progression.    Personal Factors and Comorbidities Education;Comorbidity 3+    Comorbidities AAA, AVN, HTN, severe LE edema bilat (L>R)    Examination-Activity Limitations Locomotion Level;Squat;Stairs;Stand    Examination-Participation Restrictions Yard Work;Driving;Community Activity    Stability/Clinical Decision Making Evolving/Moderate complexity    Clinical Decision Making Moderate    Rehab Potential Fair    PT Frequency 2x / week    PT Duration 8 weeks    PT Treatment/Interventions ADLs/Self Care Home Management;Electrical Stimulation;Moist Heat;Gait training;Stair training;Functional mobility training;Neuromuscular re-education;Balance training;Therapeutic exercise;Therapeutic activities;Patient/family education;Manual techniques;Passive range of motion    PT Next Visit Plan Reassess all goals/ discuss POC    Consulted and Agree with Plan of Care Patient           Patient will benefit from  skilled therapeutic intervention in order to improve the following deficits and impairments:  Abnormal gait,Pain,Improper body mechanics,Postural dysfunction,Decreased mobility,Decreased activity tolerance,Decreased endurance,Decreased range of motion,Decreased strength,Impaired UE functional use,Impaired flexibility,Difficulty walking  Visit Diagnosis: Muscle weakness (generalized)  Right knee pain, unspecified chronicity  Decreased range of motion of right knee  Chronic bilateral low back pain without sciatica     Problem List Patient Active Problem List   Diagnosis Date Noted  . Nausea 03/31/2020  . Multiple thyroid nodules 12/28/2019  . Thyroid nodule  09/29/2019  . Lymphedema 11/10/2018  . PAD (peripheral artery disease) ([place]) 10/12/2018  . Aortic atherosclerosis ([place]) 10/12/2018  . Carotid stenosis, asymptomatic, bilateral 10/12/2018  . Positive colorectal cancer screening using Cologuard test 10/09/2018  . Gastroesophageal reflux disease   . Acute gastritis without hemorrhage   . Osteoarthritis 04/29/2018  . Hiatal hernia with GERD 04/29/2018  . Hyperlipidemia 04/29/2018  . Lymphedema of both lower extremities 04/29/2018  . Left knee pain 10/23/2016  . Cataracts, bilateral 07/31/2016  . Glaucoma 07/31/2016  . Lateral epicondylitis of left elbow 10/20/2015  . Vitamin D deficiency 10/20/2015  . Type 2 diabetes, controlled, with neuropathy ([place]) 10/16/2015  . Gouty arthritis 06/03/2015  . H/O syncope 06/03/2015  . Mild intermittent asthma 06/03/2015  . Multiple gastric ulcers 06/03/2015  . Schatzki's ring 06/03/2015  . Undifferentiated connective tissue disease ([place]) 06/03/2015  . Bone disorder 04/05/2015  . Dental injury 04/05/2015  . Pericarditis 04/05/2015  . Sjogren's syndrome ([place]) 09/07/2014  . Elevated alkaline phosphatase level 08/24/2014  . Lactose intolerance 08/24/2014  . Obesity 08/24/2014  . Peripheral edema 08/24/2014  . Benign hypertension with CKD  (chronic kidney disease) stage III 08/03/2014  . Abdominal adhesions 08/02/2014  . 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[place]) 08/02/2014  . Degenerative joint disease (DJD) of lumbar spine 08/02/2014  . Diverticulosis of both small and large intestine 08/02/2014  . Hyperuricemia 08/02/2014  . Pure hypercholesterolemia 08/02/2014  . Trigger finger 08/02/2014  . Right shoulder pain 07/29/2014  . Benign neoplasm of colon 06/30/2010  . Right upper quadrant pain 06/30/2010   Pura Spice, PT, DPT # [phone] 06/05/2020, 7:01 AM  [place] [address] [place], [place], [address] Phone: [phone]   Fax:  [phone]  Name: Murle Hereford MRN: [id number] Date of Birth: [birth year]

## 2020-06-07 ENCOUNTER — Encounter: Payer: Self-pay | Admitting: Physical Therapy

## 2020-06-07 ENCOUNTER — Ambulatory Visit: Payer: Medicare Other | Admitting: Physical Therapy

## 2020-06-07 ENCOUNTER — Other Ambulatory Visit: Payer: Self-pay

## 2020-06-07 DIAGNOSIS — G8929 Other chronic pain: Secondary | ICD-10-CM

## 2020-06-07 DIAGNOSIS — M6281 Muscle weakness (generalized): Secondary | ICD-10-CM | POA: Diagnosis not present

## 2020-06-07 DIAGNOSIS — M25661 Stiffness of right knee, not elsewhere classified: Secondary | ICD-10-CM

## 2020-06-07 DIAGNOSIS — M25561 Pain in right knee: Secondary | ICD-10-CM

## 2020-06-07 NOTE — Therapy (Signed)
[facility] [address] [place], [place], [address] Phone: [phone]   Fax:  [phone]  Physical Therapy Treatment Physical Therapy Progress Note   Dates of reporting period  04/26/20  to  06/07/20  Patient Details  Name: [name] MRN: [id number] Date of Birth: 08-16-1945 Referring Provider (PT): Dr. Harlow Mares   Encounter Date: 06/07/2020   PT End of Session - 06/07/20 1922    Visit Number 33    Number of Visits 41    Date for PT Re-Evaluation 07/05/20    Authorization - Visit Number 1    Authorization - Number of Visits 10    PT Start Time 7035    PT Stop Time 1523    PT Time Calculation (min) 52 min    Activity Tolerance Patient tolerated treatment well;Patient limited by pain    Behavior During Therapy [facility] for tasks assessed/performed           Past Medical History:  Diagnosis Date   Anterolisthesis    Cervical spine   Asthma    Connective tissue disorder ([place])    recurrent carotid arteritis, temporal arteritis, vasculitis mandible, general hepatitis, avascular necrosis bilat   DDD (degenerative disc disease), lumbar    Diabetes mellitus without complication ([place])    Diverticulosis    Duodenitis    Gouty arthritis    Hiatal hernia with GERD    History of cardiovascular stress test    a. 07/2018 [facility]): Fixed inferoapical defect w/ nl contraction-->attenuation artifact. No ischemia. EF 63%.   Hyperlipidemia    Hypertension    a. 02/2019 Renal artery duplex: no evidence of prox RAS.   Hyperuricemia    Keratitis sicca, bilateral (HCC)    Lymphedema of both lower extremities    Osteoarthritis    Pericarditis    Recurrent: Aug 97, July 05, Sept 08, July 16, July 18   PUD (peptic ulcer disease)    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[place])    Sjogren's syndrome ([place])    Syncope    Tendonitis, Achilles, right    Tortuous colon    Trigger finger     Past Surgical History:   Procedure Laterality Date   ABDOMINAL HYSTERECTOMY     BREAST BIOPSY     BREAST EXCISIONAL BIOPSY Left 1986   neg   CHOLECYSTECTOMY     COLONOSCOPY WITH PROPOFOL N/A 08/26/2018   Procedure: COLONOSCOPY WITH PROPOFOL;  Surgeon: Lucilla Lame, MD;  Location: [facility] ENDOSCOPY;  Service: Endoscopy;  Laterality: N/A;   CYSTOSCOPY  02/26/2018   [name], MD    distal arthrectomy     ESOPHAGOGASTRODUODENOSCOPY (EGD) WITH PROPOFOL N/A 08/26/2018   Procedure: ESOPHAGOGASTRODUODENOSCOPY (EGD) WITH PROPOFOL;  Surgeon: Lucilla Lame, MD;  Location: [facility] ENDOSCOPY;  Service: Endoscopy;  Laterality: N/A;   EXCISION NEUROMA     KNEE SURGERY Bilateral    1985, 2001, 11/2002, 12/2006   panhysterectomy  10/1983   SHOULDER SURGERY Right    reverse total arthroplasty w/ biceps tenodesis   TONSILLECTOMY AND ADENOIDECTOMY     TOTAL HIP ARTHROPLASTY Right 04/2007   WISDOM TOOTH EXTRACTION      There were no vitals filed for this visit.   Subjective Assessment - 06/07/20 1918    Subjective Pt. entered PT with walking shoe on R foot and lymphadema wrapping.  Pt. states L lower leg/ foot has swollen since getting custom brace and put 1st brace back on.  Pt. states BP was high this morning (  175/80) and took BP meds.    Pertinent History Pt. from [place] and came to [place] to take care of friend and then Covid happened.  Pt. retired.  Pts. ex-husband lives in her house in [place].  R shoulder limitations (5# lifting restrictions).  Pt. went to [employer] for 10 years (every week) with benefits reported in low back.    Limitations Standing;Walking;Writing    Patient Stated Goals Decrease back pain/ improve strength/ mobility.    Currently in Pain? Yes   pt. did not subjectify pain and reports generalized back/ R knee pain   Pain Onset --   30 yrs             [facility] PT Assessment - 06/07/20 0001      Assessment   Medical Diagnosis Osteoarthritis multiple joints/ Chronic low back pain.    Referring Provider  (PT) Dr. Harlow Mares    Onset Date/Surgical Date 06/12/19      Prior Function   Level of Independence Independent          There.ex.:  Nustep L4 10 min. B LE/ L UE (consistent cadence)- discussed daily activity  Walking in PT clinic/ hallway with posture feedback/ consistent step pattern (fatigue noted).   2# ankle wts.: Standing hip ex. (flexion/ abduction/ knee flexion/ hip extension)- 20x.  Walking in //-bars with high marching/ lateral 4x each.  Manual tx.:  Prone position: STM to mid-thoracic/ lumbar paraspinals. Grade I-II PA mobs- Unilateral L/R from T2 to T12 spine. Pt. Has moderate hypomobility but tolerated well and reports feeling slightly better in back after returning to standing position.      PT Long Term Goals - 06/07/20 1933      PT LONG TERM GOAL #1   Title Pt. will improve FOTO to predicted score to demonstrate improvement in pain free functional mobility.    Baseline 11/11: TBD    Time 4    Period Weeks    Status New    Target Date 07/05/20      PT LONG TERM GOAL #2   Title Pt. will improve R knee flexion AROM to at least 120 deg to improve stair climbing ability without compensations.    Baseline 11/11: R knee flex AROM: 105 deg    Time 4    Period Weeks    Status On-going    Target Date 07/05/20      PT LONG TERM GOAL #3   Title Pt. will improve R knee flexion strength to 5/5 without pain to improve pain free functional mobility.    Baseline 11/11: R knee flex: 4+/5 with pain    Time 4    Period Weeks    Status Not Met    Target Date 07/05/20      PT LONG TERM GOAL #4   Title Pt. will report 4/10 low back pain at worst with no radicular symptoms to improve ADLs/ household chores.    Baseline Pt. reports no pain in back currently at rest but >6/10 pain with increase activity.; 10/7: Pain is > 5/10 NPS with household chores.    Time 4    Period Weeks    Status Partially Met    Target Date 07/05/20                 Plan -  06/07/20 1924    Clinical Impression Statement Pt. entered PT with generalized back/ R knee pain prior to tx. session.  Pt. presents with increase L lower leg swelling  and use of walking shoe/ lymphadema wrap on R lower leg.  Pt. works hard during LE resisted ther.ex. but increase LE fatigue/pain with walking activities in hallway.  Good B UE and LE AROM WFL.  See updated goals.  Pt. will continue to benefit from manual tx./ mobs. to thoracic and lumbar spine to decrease pain.    Personal Factors and Comorbidities Education;Comorbidity 3+    Comorbidities AAA, AVN, HTN, severe LE edema bilat (L>R)    Examination-Activity Limitations Locomotion Level;Squat;Stairs;Stand    Examination-Participation Restrictions Yard Work;Driving;Community Activity    Stability/Clinical Decision Making Evolving/Moderate complexity    Clinical Decision Making Moderate    Rehab Potential Fair    PT Frequency 2x / week    PT Duration 8 weeks    PT Treatment/Interventions ADLs/Self Care Home Management;Electrical Stimulation;Moist Heat;Gait training;Stair training;Functional mobility training;Neuromuscular re-education;Balance training;Therapeutic exercise;Therapeutic activities;Patient/family education;Manual techniques;Passive range of motion    PT Next Visit Plan Discuss POC/ schedule (decrease freq. to 1x/week)    Consulted and Agree with Plan of Care Patient           Patient will benefit from skilled therapeutic intervention in order to improve the following deficits and impairments:  Abnormal gait,Pain,Improper body mechanics,Postural dysfunction,Decreased mobility,Decreased activity tolerance,Decreased endurance,Decreased range of motion,Decreased strength,Impaired UE functional use,Impaired flexibility,Difficulty walking  Visit Diagnosis: Muscle weakness (generalized)  Right knee pain, unspecified chronicity  Decreased range of motion of right knee  Chronic bilateral low back pain without  sciatica     Problem List Patient Active Problem List   Diagnosis Date Noted   Nausea 03/31/2020   Multiple thyroid nodules 12/28/2019   Thyroid nodule 09/29/2019   Lymphedema 11/10/2018   PAD (peripheral artery disease) ([place]) 10/12/2018   Aortic atherosclerosis ([place]) 10/12/2018   Carotid stenosis, asymptomatic, bilateral 10/12/2018   Positive colorectal cancer screening using Cologuard test 10/09/2018   Gastroesophageal reflux disease    Acute gastritis without hemorrhage    Osteoarthritis 04/29/2018   Hiatal hernia with GERD 04/29/2018   Hyperlipidemia 04/29/2018   Lymphedema of both lower extremities 04/29/2018   Left knee pain 10/23/2016   Cataracts, bilateral 07/31/2016   Glaucoma 07/31/2016   Lateral epicondylitis of left elbow 10/20/2015   Vitamin D deficiency 10/20/2015   Type 2 diabetes, controlled, with neuropathy ([place]) 10/16/2015   Gouty arthritis 06/03/2015   H/O syncope 06/03/2015   Mild intermittent asthma 06/03/2015   Multiple gastric ulcers 06/03/2015   Schatzki's ring 06/03/2015   Undifferentiated connective tissue disease ([place]) 06/03/2015   Bone disorder 04/05/2015   Dental injury 04/05/2015   Pericarditis 04/05/2015   Sjogren's syndrome ([place]) 09/07/2014   Elevated alkaline phosphatase level 08/24/2014   Lactose intolerance 08/24/2014   Obesity 08/24/2014   Peripheral edema 08/24/2014   Benign hypertension with CKD (chronic kidney disease) stage III 08/03/2014   Abdominal adhesions 08/02/2014   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[place]) 08/02/2014   Degenerative joint disease (DJD) of lumbar spine 08/02/2014   Diverticulosis of both small and large intestine 08/02/2014   Hyperuricemia 08/02/2014   Pure hypercholesterolemia 08/02/2014   Trigger finger 08/02/2014   Right shoulder pain 07/29/2014   Benign neoplasm of colon 06/30/2010   Right upper quadrant pain 06/30/2010   Pura Spice, PT, DPT #  [phone] 06/07/2020, 7:40 PM  [facility] [address] [place], [place], [address] Phone: [phone]   Fax:  [phone]  Name: Tritia Endo MRN: [id number] Date of Birth: [birth year]

## 2020-06-09 ENCOUNTER — Encounter: Payer: Self-pay | Admitting: Physical Therapy

## 2020-06-09 ENCOUNTER — Other Ambulatory Visit: Payer: Self-pay

## 2020-06-09 ENCOUNTER — Encounter: Payer: Medicare Other | Admitting: Occupational Therapy

## 2020-06-09 ENCOUNTER — Ambulatory Visit: Payer: Medicare Other | Admitting: Physical Therapy

## 2020-06-09 DIAGNOSIS — M25561 Pain in right knee: Secondary | ICD-10-CM

## 2020-06-09 DIAGNOSIS — M6281 Muscle weakness (generalized): Secondary | ICD-10-CM

## 2020-06-09 DIAGNOSIS — M25661 Stiffness of right knee, not elsewhere classified: Secondary | ICD-10-CM

## 2020-06-09 DIAGNOSIS — G8929 Other chronic pain: Secondary | ICD-10-CM

## 2020-06-09 NOTE — Therapy (Signed)
[facility] [address] [place], [place], [address] Phone: [phone]   Fax:  [phone]  Physical Therapy Treatment  Patient Details  Name: [name] MRN: [id number] Date of Birth: 1946-04-13 Referring Provider (PT): Dr. Harlow Mares   Encounter Date: 06/09/2020   PT End of Session - 06/09/20 1439    Visit Number 34    Number of Visits 41    Date for PT Re-Evaluation 07/05/20    Authorization - Visit Number 2    Authorization - Number of Visits 10    PT Start Time 9379    PT Stop Time 1518    PT Time Calculation (min) 46 min    Activity Tolerance Patient tolerated treatment well;Patient limited by pain    Behavior During Therapy [facility] for tasks assessed/performed           Past Medical History:  Diagnosis Date  . Anterolisthesis    Cervical spine  . Asthma   . Connective tissue disorder (HCC)    recurrent carotid arteritis, temporal arteritis, vasculitis mandible, general hepatitis, avascular necrosis bilat  . DDD (degenerative disc disease), lumbar   . Diabetes mellitus without complication ([place])   . Diverticulosis   . Duodenitis   . Gouty arthritis   . Hiatal hernia with GERD   . History of cardiovascular stress test    a. 07/2018 [facility]): Fixed inferoapical defect w/ nl contraction-->attenuation artifact. No ischemia. EF 63%.  . Hyperlipidemia   . Hypertension    a. 02/2019 Renal artery duplex: no evidence of prox RAS.  Marland Kitchen Hyperuricemia   . Keratitis sicca, bilateral ([place])   . Lymphedema of both lower extremities   . Osteoarthritis   . Pericarditis    Recurrent: Aug 97, July 05, Sept 08, July 16, July 18  . PUD (peptic ulcer disease)   . 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[place])   . Sjogren's syndrome ([place])   . Syncope   . Tendonitis, Achilles, right   . Tortuous colon   . Trigger finger     Past Surgical History:  Procedure Laterality Date  . ABDOMINAL HYSTERECTOMY    . BREAST BIOPSY    . BREAST  EXCISIONAL BIOPSY Left 1986   neg  . CHOLECYSTECTOMY    . COLONOSCOPY WITH PROPOFOL N/A 08/26/2018   Procedure: COLONOSCOPY WITH PROPOFOL;  Surgeon: Lucilla Lame, MD;  Location: [facility] ENDOSCOPY;  Service: Endoscopy;  Laterality: N/A;  . CYSTOSCOPY  02/26/2018   [name], MD   . distal arthrectomy    . ESOPHAGOGASTRODUODENOSCOPY (EGD) WITH PROPOFOL N/A 08/26/2018   Procedure: ESOPHAGOGASTRODUODENOSCOPY (EGD) WITH PROPOFOL;  Surgeon: Lucilla Lame, MD;  Location: [facility] ENDOSCOPY;  Service: Endoscopy;  Laterality: N/A;  . EXCISION NEUROMA    . KNEE SURGERY Bilateral    1985, 2001, 11/2002, 12/2006  . panhysterectomy  10/1983  . SHOULDER SURGERY Right    reverse total arthroplasty w/ biceps tenodesis  . TONSILLECTOMY AND ADENOIDECTOMY    . TOTAL HIP ARTHROPLASTY Right 04/2007  . WISDOM TOOTH EXTRACTION      There were no vitals filed for this visit.   Subjective Assessment - 06/09/20 1438    Subjective Pt. entered PT with shoes on both feet.  Pt. states back is hurting more today.  Increase swelling in B feet today as compared to last tx. session.  Pt. returns next week for lymphadema tx.    Pertinent History Pt. from [place] and came to [place] to take care of friend and  then Covid happened.  Pt. retired.  Pts. ex-husband lives in her house in [place].  R shoulder limitations (5# lifting restrictions).  Pt. went to [employer] for 10 years (every week) with benefits reported in low back.    Limitations Standing;Walking;Writing    Patient Stated Goals Decrease back pain/ improve strength/ mobility.    Currently in Pain? Yes    Pain Score 1     Pain Location Back    Pain Orientation Lower    Pain Onset --   30 yrs              There.ex.:  Nustep L2 10 min. B LE/ L UE (consistent cadence)- discussed daily activity  Walking in PT clinic/ hallway with posture feedback/ consistent step pattern (fatigue noted).   Supine TrA ex.: SAQ/ pelvic tilts/ trunk rotn./ bolster bridging/ marching with  light manual resistance 20x each.     Manual tx.:  Supine LE/lumbar generalized stretches 9 min.      PT Long Term Goals - 06/07/20 1933      PT LONG TERM GOAL #1   Title Pt. will improve FOTO to predicted score to demonstrate improvement in pain free functional mobility.    Baseline 11/11: TBD    Time 4    Period Weeks    Status New    Target Date 07/05/20      PT LONG TERM GOAL #2   Title Pt. will improve R knee flexion AROM to at least 120 deg to improve stair climbing ability without compensations.    Baseline 11/11: R knee flex AROM: 105 deg    Time 4    Period Weeks    Status On-going    Target Date 07/05/20      PT LONG TERM GOAL #3   Title Pt. will improve R knee flexion strength to 5/5 without pain to improve pain free functional mobility.    Baseline 11/11: R knee flex: 4+/5 with pain    Time 4    Period Weeks    Status Not Met    Target Date 07/05/20      PT LONG TERM GOAL #4   Title Pt. will report 4/10 low back pain at worst with no radicular symptoms to improve ADLs/ household chores.    Baseline Pt. reports no pain in back currently at rest but >6/10 pain with increase activity.; 10/7: Pain is > 5/10 NPS with household chores.    Time 4    Period Weeks    Status Partially Met    Target Date 07/05/20               Plan - 06/09/20 1440    Clinical Impression Statement No prone manual tx. today and PT focused on gentle LE/lumbar stretches in supine position.  No increase c/o back pain and walking/ standing ex. limited by significant swelling in B lower legs/feet.  Pt. instructed to elevated LE when she returns home today.    Personal Factors and Comorbidities Education;Comorbidity 3+    Comorbidities AAA, AVN, HTN, severe LE edema bilat (L>R)    Examination-Activity Limitations Locomotion Level;Squat;Stairs;Stand    Examination-Participation Restrictions Yard Work;Driving;Community Activity    Stability/Clinical Decision Making Evolving/Moderate  complexity    Clinical Decision Making Moderate    Rehab Potential Fair    PT Frequency 2x / week    PT Duration 4 weeks    PT Treatment/Interventions ADLs/Self Care Home Management;Electrical Stimulation;Moist Heat;Gait training;Stair training;Functional mobility training;Neuromuscular re-education;Balance training;Therapeutic exercise;Therapeutic  activities;Patient/family education;Manual techniques;Passive range of motion    PT Next Visit Plan Issue new HEP next tx.    Consulted and Agree with Plan of Care Patient           Patient will benefit from skilled therapeutic intervention in order to improve the following deficits and impairments:  Abnormal gait,Pain,Improper body mechanics,Postural dysfunction,Decreased mobility,Decreased activity tolerance,Decreased endurance,Decreased range of motion,Decreased strength,Impaired UE functional use,Impaired flexibility,Difficulty walking  Visit Diagnosis: Muscle weakness (generalized)  Right knee pain, unspecified chronicity  Decreased range of motion of right knee  Chronic bilateral low back pain without sciatica     Problem List Patient Active Problem List   Diagnosis Date Noted  . Nausea 03/31/2020  . Multiple thyroid nodules 12/28/2019  . Thyroid nodule 09/29/2019  . Lymphedema 11/10/2018  . PAD (peripheral artery disease) ([place]) 10/12/2018  . Aortic atherosclerosis ([place]) 10/12/2018  . Carotid stenosis, asymptomatic, bilateral 10/12/2018  . Positive colorectal cancer screening using Cologuard test 10/09/2018  . Gastroesophageal reflux disease   . Acute gastritis without hemorrhage   . Osteoarthritis 04/29/2018  . Hiatal hernia with GERD 04/29/2018  . Hyperlipidemia 04/29/2018  . Lymphedema of both lower extremities 04/29/2018  . Left knee pain 10/23/2016  . Cataracts, bilateral 07/31/2016  . Glaucoma 07/31/2016  . Lateral epicondylitis of left elbow 10/20/2015  . Vitamin D deficiency 10/20/2015  . Type 2 diabetes,  controlled, with neuropathy ([place]) 10/16/2015  . Gouty arthritis 06/03/2015  . H/O syncope 06/03/2015  . Mild intermittent asthma 06/03/2015  . Multiple gastric ulcers 06/03/2015  . Schatzki's ring 06/03/2015  . Undifferentiated connective tissue disease ([place]) 06/03/2015  . Bone disorder 04/05/2015  . Dental injury 04/05/2015  . Pericarditis 04/05/2015  . Sjogren's syndrome ([place]) 09/07/2014  . Elevated alkaline phosphatase level 08/24/2014  . Lactose intolerance 08/24/2014  . Obesity 08/24/2014  . Peripheral edema 08/24/2014  . 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 Abdominal adhesions 08/02/2014  . 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[place]) 08/02/2014  . Degenerative joint disease (DJD) of lumbar spine 08/02/2014  . Diverticulosis of both small and large intestine 08/02/2014  . Hyperuricemia 08/02/2014  . Pure hypercholesterolemia 08/02/2014  . Trigger finger 08/02/2014  . Right shoulder pain 07/29/2014  . Benign neoplasm of colon 06/30/2010  . Right upper quadrant pain 06/30/2010   Pura Spice, PT, DPT # [phone] 06/09/2020, 3:20 PM  [facility] [address] [place], [place], [address] Phone: [phone]   Fax:  [phone]  Name: [name] MRN: [id number] Date of Birth: Jul 08, 1945

## 2020-06-10 ENCOUNTER — Other Ambulatory Visit: Payer: Self-pay | Admitting: Family

## 2020-06-15 ENCOUNTER — Other Ambulatory Visit: Payer: Self-pay

## 2020-06-15 ENCOUNTER — Ambulatory Visit: Payer: Medicare Other | Attending: Cardiovascular Disease | Admitting: Occupational Therapy

## 2020-06-15 DIAGNOSIS — I890 Lymphedema, not elsewhere classified: Secondary | ICD-10-CM | POA: Diagnosis not present

## 2020-06-15 NOTE — Therapy (Addendum)
[place] MAIN [facility] SERVICES [address] [place], [place], [address] Phone: [phone]   Fax:  [phone]  Occupational Therapy Treatment  Patient Details  Name: [name] MRN: [id number] Date of Birth: 03/13/1946 Referring Provider (OT): Patricia Rogue, MD   Encounter Date: 06/15/2020   OT End of Session - 06/15/20 1507    Visit Number 28 /36   OT Start Time 0210    OT Stop Time 0310    OT Time Calculation (min) 60 min           Past Medical History:  Diagnosis Date  . Anterolisthesis    Cervical spine  . Asthma   . Connective tissue disorder (HCC)    recurrent carotid arteritis, temporal arteritis, vasculitis mandible, general hepatitis, avascular necrosis bilat  . DDD (degenerative disc disease), lumbar   . Diabetes mellitus without complication ([place])   . Diverticulosis   . Duodenitis   . Gouty arthritis   . Hiatal hernia with GERD   . History of cardiovascular stress test    a. 07/2018 [facility]): Fixed inferoapical defect w/ nl contraction-->attenuation artifact. No ischemia. EF 63%.  . Hyperlipidemia   . Hypertension    a. 02/2019 Renal artery duplex: no evidence of prox RAS.  Marland Kitchen Hyperuricemia   . Keratitis sicca, bilateral ([place])   . Lymphedema of both lower extremities   . Osteoarthritis   . Pericarditis    Recurrent: Aug 97, July 05, Sept 08, July 16, July 18  . PUD (peptic ulcer disease)   . 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[place])   . Sjogren's syndrome ([place])   . Syncope   . Tendonitis, Achilles, right   . Tortuous colon   . Trigger finger     Past Surgical History:  Procedure Laterality Date  . ABDOMINAL HYSTERECTOMY    . BREAST BIOPSY    . BREAST EXCISIONAL BIOPSY Left 1986   neg  . CHOLECYSTECTOMY    . COLONOSCOPY WITH PROPOFOL N/A 08/26/2018   Procedure: COLONOSCOPY WITH PROPOFOL;  Surgeon: Lucilla Lame, MD;  Location: [facility] ENDOSCOPY;  Service: Endoscopy;  Laterality: N/A;  . CYSTOSCOPY  02/26/2018    [name], MD   . distal arthrectomy    . ESOPHAGOGASTRODUODENOSCOPY (EGD) WITH PROPOFOL N/A 08/26/2018   Procedure: ESOPHAGOGASTRODUODENOSCOPY (EGD) WITH PROPOFOL;  Surgeon: Lucilla Lame, MD;  Location: [facility] ENDOSCOPY;  Service: Endoscopy;  Laterality: N/A;  . EXCISION NEUROMA    . KNEE SURGERY Bilateral    1985, 2001, 11/2002, 12/2006  . panhysterectomy  10/1983  . SHOULDER SURGERY Right    reverse total arthroplasty w/ biceps tenodesis  . TONSILLECTOMY AND ADENOIDECTOMY    . TOTAL HIP ARTHROPLASTY Right 04/2007  . WISDOM TOOTH EXTRACTION      There were no vitals filed for this visit.   Subjective Assessment - 06/15/20 1418    Subjective  [name] presents for OT Rx visit  28/ 36 to address BLE lymphedema. Pt's presents wearing initial custom compression knee high on LLE, and RLE compression wraps. She reports replacement LLE stocking is not containing distal leg swelling well, so she has been wearing stronger garment and tolerating it very well.    Pain Onset --   30 yrs                       OT Treatments/Exercises (OP) - 06/15/20 0001      ADLs   ADL Education Given Yes  Manual Therapy   Manual Therapy Edema management    Manual Lymphatic Drainage (MLD) MLD to RLE as established    Compression Bandaging RLE compression wraps. LLE garment not doffed.                  OT Education - 06/15/20 1505    Education Details Cont Pt edu for LE self care.    Person(s) Educated Patient    Methods Explanation;Demonstration;Handout    Comprehension Verbalized understanding;Returned demonstration   Continues Pt edu for lymphedema self care home program componenets throughout session while performing manual therapy. Pt verbalizes understanding of importance of ongoing compliane to limit LE progression over time. Good return.              OT Long Term Goals - 06/01/20 1441      OT LONG TERM GOAL #1   Title With modified independent (extra time)  Pt will be able to apply knee length, multi-layer, short stretch compression wraps daily from ankle to tibial tuberosity on one leg at a time using correct gradient techniques to return affected limb/s, as closely as possible, to premorbid size and shape, to limit leg pain and infection risk, and to improve safe functional mobility and ADLs performance.    Baseline Dependent    Time 4    Period Days    Status Achieved   achieved 4th visit     OT LONG TERM GOAL #2   Title Pt will be able to verbalize signs and symptoms of cellulitis infection and identify lymphedema precautions using printed resource (modified independence) for reference to decrease infection risk and limit LE progression over time.    Baseline Max A    Time 4    Period Days    Status Achieved      OT LONG TERM GOAL #3   Title Pt will sustain a least 85% compliance with all daily LE self-care home program components throughout Intensive Phase CDT, including impeccable skin care, lymphatic pumping ther ex,  compression wraps and simple self MLD, to ensure optimal limb volume reduction, to limit infection risk and to limit LE progression.    Baseline dependent    Time 12    Period Weeks    Status Achieved   "I'm 98% !"     OT LONG TERM GOAL #4   Title Using assistive devices (modified independence)  Pt will be able to don and doff appropriate daytime compression garments to limit edema re-accumulation and further progression by issue date.    Baseline Max A    Time 12    Period Weeks    Status Achieved      OT LONG TERM GOAL #5   Title After skilled teaching Pt  will be able to perform all lymphedema self-care home program components with modified independence (extra time, assistive devices PRN) , including simple self MLD, lymphatic pumping exercises, don, doff and wear compression garments  daily, sustain   impeccable skin care daily, to limit lymphedema progression and infection risk.    Baseline Max A    Time 12     Period Weeks    Status Achieved   Pt is trained for comrpession wrapping skin care and ther ex. She is still working on learning simple self MLD as of 03/21/20     OT LONG TERM GOAL #6   Title Pt will increase score on FOTO 10 points, from 45 to 55/100, to improve functional performance of basic ADLs.  Baseline Max A    Time 12    Period Weeks    Status On-going          Clinical Impression: Mrs Monacelli tolerated all aspects of therapy today, including MLD, skin care and gradient compression wrap without increased pain. Limb volume reduction has reached clinical plateau and Pt is managing swelling well between sessions due to excellent compliance with LE home program. RLE custom compression garment is ordered and we are awaiting delivery for fitting. Plan to reduce OT frequency once final garment / device fitting is complet.     OT Occupational Profile and History Comprehensive Assessment- Review of records and extensive additional review of physical, cognitive, psychosocial history related to current functional performance   Occupational performance deficits (Please refer to evaluation for details): ADL's;IADL's;Social Participation;Work;Leisure;Other   body image  Body Structure / Function / Physical Skills ADL;Decreased knowledge of precautions;ROM;Balance;Decreased knowledge of use of DME;Mobility;Edema;Skin integrity;Pain;IADL   Rehab Potential Good   Clinical Decision Making Several treatment options, min-mod task modification necessary   Comorbidities Affecting Occupational Performance: Presence of comorbidities impacting occupational performance   Modification or Assistance to Complete Evaluation  Min-Moderate modification of tasks or assist with assess necessary to complete eval   OT Frequency 2x / week   OT Duration 12 weeks   and PRN  OT Treatment/Interventions Self-care/ADL training;Therapeutic exercise;Manual lymph drainage;Compression bandaging;Patient/family education;Other  (comment);Therapeutic activities;DME and/or AE instruction;Manual Therapy   Plan Intensive Phase CDT: 1 leg at a time to limit fall risk. MLD, skin care, ther ex , cimpression wraps -> daytime garments, HOS device, consider Flexitouch if appropriate   Recommended Other Services Fit w/ appropriate compression garments that are effective, comfortable and  Pt can don using AE and extra time   Consulted and Agree with Plan of Care Patient             Patient will benefit from skilled therapeutic intervention in order to improve the following deficits and impairments:           Visit Diagnosis: Lymphedema, not elsewhere classified    Problem List Patient Active Problem List   Diagnosis Date Noted  . Nausea 03/31/2020  . Multiple thyroid nodules 12/28/2019  . Thyroid nodule 09/29/2019  . Lymphedema 11/10/2018  . PAD (peripheral artery disease) ([place]) 10/12/2018  . Aortic atherosclerosis ([place]) 10/12/2018  . Carotid stenosis, asymptomatic, bilateral 10/12/2018  . Positive colorectal cancer screening using Cologuard test 10/09/2018  . Gastroesophageal reflux disease   . Acute gastritis without hemorrhage   . Osteoarthritis 04/29/2018  . Hiatal hernia with GERD 04/29/2018  . Hyperlipidemia 04/29/2018  . Lymphedema of both lower extremities 04/29/2018  . Left knee pain 10/23/2016  . Cataracts, bilateral 07/31/2016  . Glaucoma 07/31/2016  . Lateral epicondylitis of left elbow 10/20/2015  . Vitamin D deficiency 10/20/2015  . Type 2 diabetes, controlled, with neuropathy ([place]) 10/16/2015  . Gouty arthritis 06/03/2015  . H/O syncope 06/03/2015  . Mild intermittent asthma 06/03/2015  . Multiple gastric ulcers 06/03/2015  . Schatzki's ring 06/03/2015  . Undifferentiated connective tissue disease ([place]) 06/03/2015  . Bone disorder 04/05/2015  . Dental injury 04/05/2015  . Pericarditis 04/05/2015  . Sjogren's syndrome ([place]) 09/07/2014  . Elevated alkaline phosphatase level  08/24/2014  . Lactose intolerance 08/24/2014  . Obesity 08/24/2014  . Peripheral edema 08/24/2014  . 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 Abdominal adhesions 08/02/2014  . 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[place]) 08/02/2014  .  Degenerative joint disease (DJD) of lumbar spine 08/02/2014  . Diverticulosis of both small and large intestine 08/02/2014  . Hyperuricemia 08/02/2014  . Pure hypercholesterolemia 08/02/2014  . Trigger finger 08/02/2014  . Right shoulder pain 07/29/2014  . Benign neoplasm of colon 06/30/2010  . Right upper quadrant pain 06/30/2010    Andrey Spearman, MS, OTR/L, [facility] 06/15/20 3:16 PM   [place] MAIN [facility] SERVICES [address] [place], [place], [address] Phone: [phone]   Fax:  [phone]  Name: [name] MRN: [id number] Date of Birth: [birth year]

## 2020-06-16 ENCOUNTER — Ambulatory Visit: Payer: Medicare Other | Attending: Cardiovascular Disease | Admitting: Physical Therapy

## 2020-06-16 ENCOUNTER — Other Ambulatory Visit: Payer: Self-pay

## 2020-06-16 DIAGNOSIS — M25661 Stiffness of right knee, not elsewhere classified: Secondary | ICD-10-CM | POA: Diagnosis present

## 2020-06-16 DIAGNOSIS — M545 Low back pain, unspecified: Secondary | ICD-10-CM | POA: Insufficient documentation

## 2020-06-16 DIAGNOSIS — G8929 Other chronic pain: Secondary | ICD-10-CM | POA: Insufficient documentation

## 2020-06-16 DIAGNOSIS — M6281 Muscle weakness (generalized): Secondary | ICD-10-CM | POA: Diagnosis not present

## 2020-06-16 DIAGNOSIS — M25561 Pain in right knee: Secondary | ICD-10-CM | POA: Insufficient documentation

## 2020-06-20 ENCOUNTER — Encounter: Payer: Self-pay | Admitting: Physical Therapy

## 2020-06-20 NOTE — Therapy (Signed)
[facility] [address]. [place], [place], [address] Phone: [phone]   Fax:  [phone]  Physical Therapy Treatment  Patient Details  Name: [name] MRN: [id number] Date of Birth: Apr 22, 1946 Referring Provider (PT): Dr. Harlow Mares   Encounter Date: 06/16/2020   PT End of Session - 06/20/20 1833    Visit Number 35    Number of Visits 41    Date for PT Re-Evaluation 07/05/20    Authorization - Visit Number 3    Authorization - Number of Visits 10    PT Start Time 3570    PT Stop Time 1732    PT Time Calculation (min) 51 min    Activity Tolerance Patient tolerated treatment well;Patient limited by pain    Behavior During Therapy [facility] for tasks assessed/performed           Past Medical History:  Diagnosis Date  . Anterolisthesis    Cervical spine  . Asthma   . Connective tissue disorder (HCC)    recurrent carotid arteritis, temporal arteritis, vasculitis mandible, general hepatitis, avascular necrosis bilat  . DDD (degenerative disc disease), lumbar   . Diabetes mellitus without complication ([place])   . Diverticulosis   . Duodenitis   . Gouty arthritis   . Hiatal hernia with GERD   . History of cardiovascular stress test    a. 07/2018 [facility]): Fixed inferoapical defect w/ nl contraction-->attenuation artifact. No ischemia. EF 63%.  . Hyperlipidemia   . Hypertension    a. 02/2019 Renal artery duplex: no evidence of prox RAS.  Marland Kitchen Hyperuricemia   . Keratitis sicca, bilateral ([place])   . Lymphedema of both lower extremities   . Osteoarthritis   . Pericarditis    Recurrent: Aug 97, July 05, Sept 08, July 16, July 18  . PUD (peptic ulcer disease)   . 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[place])   . Sjogren's syndrome ([place])   . Syncope   . Tendonitis, Achilles, right   . Tortuous colon   . Trigger finger     Past Surgical History:  Procedure Laterality Date  . ABDOMINAL HYSTERECTOMY    . BREAST BIOPSY    . BREAST EXCISIONAL  BIOPSY Left 1986   neg  . CHOLECYSTECTOMY    . COLONOSCOPY WITH PROPOFOL N/A 08/26/2018   Procedure: COLONOSCOPY WITH PROPOFOL;  Surgeon: Lucilla Lame, MD;  Location: [facility] ENDOSCOPY;  Service: Endoscopy;  Laterality: N/A;  . CYSTOSCOPY  02/26/2018   [name], MD   . distal arthrectomy    . ESOPHAGOGASTRODUODENOSCOPY (EGD) WITH PROPOFOL N/A 08/26/2018   Procedure: ESOPHAGOGASTRODUODENOSCOPY (EGD) WITH PROPOFOL;  Surgeon: Lucilla Lame, MD;  Location: [facility] ENDOSCOPY;  Service: Endoscopy;  Laterality: N/A;  . EXCISION NEUROMA    . KNEE SURGERY Bilateral    1985, 2001, 11/2002, 12/2006  . panhysterectomy  10/1983  . SHOULDER SURGERY Right    reverse total arthroplasty w/ biceps tenodesis  . TONSILLECTOMY AND ADENOIDECTOMY    . TOTAL HIP ARTHROPLASTY Right 04/2007  . WISDOM TOOTH EXTRACTION      There were no vitals filed for this visit.   Subjective Assessment - 06/20/20 1827    Subjective Pt. reports low back is hurting today and c/o soreness in low back (bilateral).  Pt. reports soreness in B LE after use of ankle wts. last tx. session.  Pt. states she has returned to OT for [place] tx of R LE.  Pt. scheduled for MD appt. to assess R knee/ ?TKA on  07/11/20.    Pertinent History Pt. from [place] and came to [place] to take care of friend and then Covid happened.  Pt. retired.  Pts. ex-husband lives in her house in [place].  R shoulder limitations (5# lifting restrictions).  Pt. went to [facility] for 10 years (every week) with benefits reported in low back.    Limitations Standing;Walking;Writing    Patient Stated Goals Decrease back pain/ improve strength/ mobility.    Currently in Pain? Yes    Pain Score 2     Pain Location Back    Pain Orientation Lower    Pain Descriptors / Indicators Sore    Pain Type Chronic pain    Pain Onset --   30 yrs           There.ex.:  Nustep L410 min. B LE/ L UE(consistent cadence)- increase resistance today with no increase c/o pain.  Good  endurance.  Walking in PT clinic/ hallwaywith posture feedback/ consistent step pattern (fatigue noted).    Walking in //-bars (4x)- forward and lateral with increase hip/knee flexion (cuing to correct posture)- good balance.  Pharmacist, hospital.    Seated LE ex.: marching/ LAQ/ heel and toe raises 20x.  No ankle wts. Today due to lower leg soreness/ swelling.    Discussed core ex. Program (supine).    Standing in //-bars: shoulder flexion/ abduction (assessment of scapular mobility)    Manual tx.:  Supine LE/lumbar generalized stretches (knee to chest/ piriformis/ trunk rotn./ hamstring) 11 min.    Seated STM to low back.  Significant tenderness in R T10-L2 paraspinal musculature.       PT Long Term Goals - 06/07/20 1933      PT LONG TERM GOAL #1   Title Pt. will improve FOTO to predicted score to demonstrate improvement in pain free functional mobility.    Baseline 11/11: TBD    Time 4    Period Weeks    Status New    Target Date 07/05/20      PT LONG TERM GOAL #2   Title Pt. will improve R knee flexion AROM to at least 120 deg to improve stair climbing ability without compensations.    Baseline 11/11: R knee flex AROM: 105 deg    Time 4    Period Weeks    Status On-going    Target Date 07/05/20      PT LONG TERM GOAL #3   Title Pt. will improve R knee flexion strength to 5/5 without pain to improve pain free functional mobility.    Baseline 11/11: R knee flex: 4+/5 with pain    Time 4    Period Weeks    Status Not Met    Target Date 07/05/20      PT LONG TERM GOAL #4   Title Pt. will report 4/10 low back pain at worst with no radicular symptoms to improve ADLs/ household chores.    Baseline Pt. reports no pain in back currently at rest but >6/10 pain with increase activity.; 10/7: Pain is > 5/10 NPS with household chores.    Time 4    Period Weeks    Status Partially Met    Target Date 07/05/20                 Plan - 06/20/20 1834    Clinical  Impression Statement Pt. entered PT with c/o generalized low back/ R knee and lower leg pain.  Pt. c/o significant tenderness/ soreness in R T10-L2 paraspinals.  Good LE endurnace with use of Nustep and standing ther.ex. with no ankle wts. today.  Pt. understands core ex. in supine position.  Limited LE stretching secondary to tenderness/ R knee pain.  Pt. scheduled to see MD on 1/31 to discuss options for R knee pain.  Moderate cuing for posture correction with walking/ standing ther.ex. in //-bars.    Personal Factors and Comorbidities Education;Comorbidity 3+    Comorbidities AAA, AVN, HTN, severe LE edema bilat (L>R)    Examination-Activity Limitations Locomotion Level;Squat;Stairs;Stand    Examination-Participation Restrictions Yard Work;Driving;Community Activity    Stability/Clinical Decision Making Evolving/Moderate complexity    Clinical Decision Making Moderate    Rehab Potential Fair    PT Frequency 2x / week    PT Duration 4 weeks    PT Treatment/Interventions ADLs/Self Care Home Management;Electrical Stimulation;Moist Heat;Gait training;Stair training;Functional mobility training;Neuromuscular re-education;Balance training;Therapeutic exercise;Therapeutic activities;Patient/family education;Manual techniques;Passive range of motion    PT Next Visit Plan Issue new HEP next tx. for LE strengthening/ quad stability.    Consulted and Agree with Plan of Care Patient           Patient will benefit from skilled therapeutic intervention in order to improve the following deficits and impairments:  Abnormal gait,Pain,Improper body mechanics,Postural dysfunction,Decreased mobility,Decreased activity tolerance,Decreased endurance,Decreased range of motion,Decreased strength,Impaired UE functional use,Impaired flexibility,Difficulty walking  Visit Diagnosis: Muscle weakness (generalized)  Right knee pain, unspecified chronicity  Decreased range of motion of right knee  Chronic bilateral  low back pain without sciatica     Problem List Patient Active Problem List   Diagnosis Date Noted  . Nausea 03/31/2020  . Multiple thyroid nodules 12/28/2019  . Thyroid nodule 09/29/2019  . Lymphedema 11/10/2018  . PAD (peripheral artery disease) ([place]) 10/12/2018  . Aortic atherosclerosis ([place]) 10/12/2018  . Carotid stenosis, asymptomatic, bilateral 10/12/2018  . Positive colorectal cancer screening using Cologuard test 10/09/2018  . Gastroesophageal reflux disease   . Acute gastritis without hemorrhage   . Osteoarthritis 04/29/2018  . Hiatal hernia with GERD 04/29/2018  . Hyperlipidemia 04/29/2018  . Lymphedema of both lower extremities 04/29/2018  . Left knee pain 10/23/2016  . Cataracts, bilateral 07/31/2016  . Glaucoma 07/31/2016  . Lateral epicondylitis of left elbow 10/20/2015  . Vitamin D deficiency 10/20/2015  . Type 2 diabetes, controlled, with neuropathy ([place]) 10/16/2015  . Gouty arthritis 06/03/2015  . H/O syncope 06/03/2015  . Mild intermittent asthma 06/03/2015  . Multiple gastric ulcers 06/03/2015  . Schatzki's ring 06/03/2015  . Undifferentiated connective tissue disease ([place]) 06/03/2015  . Bone disorder 04/05/2015  . Dental injury 04/05/2015  . Pericarditis 04/05/2015  . Sjogren's syndrome ([place]) 09/07/2014  . Elevated alkaline phosphatase level 08/24/2014  . Lactose intolerance 08/24/2014  . Obesity 08/24/2014  . Peripheral edema 08/24/2014  . 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 Abdominal adhesions 08/02/2014  . 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[place]) 08/02/2014  . Degenerative joint disease (DJD) of lumbar spine 08/02/2014  . Diverticulosis of both small and large intestine 08/02/2014  . Hyperuricemia 08/02/2014  . Pure hypercholesterolemia 08/02/2014  . Trigger finger 08/02/2014  . Right shoulder pain 07/29/2014  . Benign neoplasm of colon 06/30/2010  . Right upper quadrant pain 06/30/2010    Pura Spice, PT, DPT # [phone] 06/20/2020, 6:39 PM  [facility] [address] [place], [place], [address] Phone: [phone]   Fax:  [phone]  Name: [name] MRN: [id number] Date  of Birth: [birth year]

## 2020-06-21 ENCOUNTER — Other Ambulatory Visit: Payer: Self-pay

## 2020-06-21 ENCOUNTER — Ambulatory Visit: Payer: Medicare Other

## 2020-06-21 ENCOUNTER — Encounter: Payer: Self-pay | Admitting: Physical Therapy

## 2020-06-21 DIAGNOSIS — M25661 Stiffness of right knee, not elsewhere classified: Secondary | ICD-10-CM

## 2020-06-21 DIAGNOSIS — G8929 Other chronic pain: Secondary | ICD-10-CM

## 2020-06-21 DIAGNOSIS — M6281 Muscle weakness (generalized): Secondary | ICD-10-CM

## 2020-06-21 DIAGNOSIS — M25561 Pain in right knee: Secondary | ICD-10-CM

## 2020-06-21 NOTE — Therapy (Signed)
[place] [facility] [address] [place], [place], [address] Phone: [phone]   Fax:  [phone]  Physical Therapy Treatment  Patient Details  Name: [name] MRN: [id number] Date of Birth: Mar 10, 1946 Referring Provider (PT): Dr. Harlow Mares   Encounter Date: 06/21/2020   PT End of Session - 06/21/20 1522    Visit Number 36    Number of Visits 41    Date for PT Re-Evaluation 07/05/20    Authorization - Visit Number 4    Authorization - Number of Visits 10    PT Start Time 4239    PT Stop Time 1600    PT Time Calculation (min) 45 min    Activity Tolerance Patient tolerated treatment well;Patient limited by pain    Behavior During Therapy [facility] for tasks assessed/performed           Past Medical History:  Diagnosis Date  . Anterolisthesis    Cervical spine  . Asthma   . Connective tissue disorder (HCC)    recurrent carotid arteritis, temporal arteritis, vasculitis mandible, general hepatitis, avascular necrosis bilat  . DDD (degenerative disc disease), lumbar   . Diabetes mellitus without complication ([place])   . Diverticulosis   . Duodenitis   . Gouty arthritis   . Hiatal hernia with GERD   . History of cardiovascular stress test    a. 07/2018 [facility]): Fixed inferoapical defect w/ nl contraction-->attenuation artifact. No ischemia. EF 63%.  . Hyperlipidemia   . Hypertension    a. 02/2019 Renal artery duplex: no evidence of prox RAS.  Marland Kitchen Hyperuricemia   . Keratitis sicca, bilateral ([place])   . Lymphedema of both lower extremities   . Osteoarthritis   . Pericarditis    Recurrent: Aug 97, July 05, Sept 08, July 16, July 18  . PUD (peptic ulcer disease)   . 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[place])   . Sjogren's syndrome ([place])   . Syncope   . Tendonitis, Achilles, right   . Tortuous colon   . Trigger finger     Past Surgical History:  Procedure Laterality Date  . ABDOMINAL HYSTERECTOMY    . BREAST BIOPSY    . BREAST EXCISIONAL  BIOPSY Left 1986   neg  . CHOLECYSTECTOMY    . COLONOSCOPY WITH PROPOFOL N/A 08/26/2018   Procedure: COLONOSCOPY WITH PROPOFOL;  Surgeon: Lucilla Lame, MD;  Location: [facility] ENDOSCOPY;  Service: Endoscopy;  Laterality: N/A;  . CYSTOSCOPY  02/26/2018   [name], MD   . distal arthrectomy    . ESOPHAGOGASTRODUODENOSCOPY (EGD) WITH PROPOFOL N/A 08/26/2018   Procedure: ESOPHAGOGASTRODUODENOSCOPY (EGD) WITH PROPOFOL;  Surgeon: Lucilla Lame, MD;  Location: [facility] ENDOSCOPY;  Service: Endoscopy;  Laterality: N/A;  . EXCISION NEUROMA    . KNEE SURGERY Bilateral    1985, 2001, 11/2002, 12/2006  . panhysterectomy  10/1983  . SHOULDER SURGERY Right    reverse total arthroplasty w/ biceps tenodesis  . TONSILLECTOMY AND ADENOIDECTOMY    . TOTAL HIP ARTHROPLASTY Right 04/2007  . WISDOM TOOTH EXTRACTION      There were no vitals filed for this visit.   Subjective Assessment - 06/21/20 1520    Subjective Patient reported that the previous intervention to her knee was helpful with her creptitus.    Pertinent History [place]. from [place] and came to [place] to take care of friend and then Covid happened.  Pt. retired.  Pts. ex-husband lives in her house in [place].  R shoulder limitations (5# lifting restrictions).  Pt. went to [employer] [facility] for 10 years (every week) with benefits reported in low back.    Limitations Standing;Walking;Writing    Patient Stated Goals Decrease back pain/ improve strength/ mobility.    Currently in Pain? Yes    Pain Score 3    3 in sitting, 7/10 in standing/walking   Pain Location Back    Pain Orientation Lower    Pain Descriptors / Indicators Sore    Pain Type Chronic pain    Pain Onset More than a month ago   30 years            There.ex.:   BP assessed at start of session as able.  BP assessed after nustep and walking: 136/74 HR 59     Nustep L4 10 min. B LE/ L UE (consistent cadence)- increase resistance today with no increase c/o pain.  Good endurance.   Walking in PT clinic/  hallway with posture feedback/ consistent step pattern (fatigue noted).     Walking in //-bars (6x)- forward and lateral with increase hip/knee flexion (cuing to correct posture)- good balance.  Pharmacist, hospital.     Seated LE ex.: marching/ LAQ/ heel and toe raises 20x.  No ankle wts. Today due to lower leg soreness/ swelling.    Discussed core ex. Program (supine).     Manual tx.:  Seated STM to low back.  Significant tenderness in R T10-L2 paraspinal musculature.       Pt response/clinical impression: The patient was able to perform seated exercises with ankle weights, and maintain conversation throughout interventions today. Pt with continued tenderness/soresness on R T10-L2 paraspinals. The patient would benefit from further skilled PT intervention to continue progress towards of goals.      PT Long Term Goals - 06/07/20 1933      PT LONG TERM GOAL #1   Title Pt. will improve FOTO to predicted score to demonstrate improvement in pain free functional mobility.    Baseline 11/11: TBD    Time 4    Period Weeks    Status New    Target Date 07/05/20      PT LONG TERM GOAL #2   Title Pt. will improve R knee flexion AROM to at least 120 deg to improve stair climbing ability without compensations.    Baseline 11/11: R knee flex AROM: 105 deg    Time 4    Period Weeks    Status On-going    Target Date 07/05/20      PT LONG TERM GOAL #3   Title Pt. will improve R knee flexion strength to 5/5 without pain to improve pain free functional mobility.    Baseline 11/11: R knee flex: 4+/5 with pain    Time 4    Period Weeks    Status Not Met    Target Date 07/05/20      PT LONG TERM GOAL #4   Title Pt. will report 4/10 low back pain at worst with no radicular symptoms to improve ADLs/ household chores.    Baseline Pt. reports no pain in back currently at rest but >6/10 pain with increase activity.; 10/7: Pain is > 5/10 NPS with household chores.    Time 4    Period Weeks    Status  Partially Met    Target Date 07/05/20                 Plan - 06/21/20 1522    Clinical Impression Statement The patient was able to  perform seated exercises with ankle weights, and maintain conversation throughout interventions today. Pt with continued tenderness/soresness on R T10-L2 paraspinals. The patient would benefit from further skilled PT intervention to continue progress towards of goals.    Personal Factors and Comorbidities Education;Comorbidity 3+    Comorbidities AAA, AVN, HTN, severe LE edema bilat (L>R)    Examination-Activity Limitations Locomotion Level;Squat;Stairs;Stand    Examination-Participation Restrictions Yard Work;Driving;Community Activity    Stability/Clinical Decision Making Evolving/Moderate complexity    Rehab Potential Fair    PT Frequency 2x / week    PT Duration 4 weeks    PT Treatment/Interventions ADLs/Self Care Home Management;Electrical Stimulation;Moist Heat;Gait training;Stair training;Functional mobility training;Neuromuscular re-education;Balance training;Therapeutic exercise;Therapeutic activities;Patient/family education;Manual techniques;Passive range of motion    PT Next Visit Plan Issue new HEP next tx. for LE strengthening/ quad stability.    Consulted and Agree with Plan of Care Patient           Patient will benefit from skilled therapeutic intervention in order to improve the following deficits and impairments:  Abnormal gait,Pain,Improper body mechanics,Postural dysfunction,Decreased mobility,Decreased activity tolerance,Decreased endurance,Decreased range of motion,Decreased strength,Impaired UE functional use,Impaired flexibility,Difficulty walking  Visit Diagnosis: Muscle weakness (generalized)  Right knee pain, unspecified chronicity  Decreased range of motion of right knee  Chronic bilateral low back pain without sciatica     Problem List Patient Active Problem List   Diagnosis Date Noted  . Nausea 03/31/2020  .  Multiple thyroid nodules 12/28/2019  . Thyroid nodule 09/29/2019  . Lymphedema 11/10/2018  . PAD (peripheral artery disease) ([place]) 10/12/2018  . Aortic atherosclerosis ([place]) 10/12/2018  . Carotid stenosis, asymptomatic, bilateral 10/12/2018  . Positive colorectal cancer screening using Cologuard test 10/09/2018  . Gastroesophageal reflux disease   . Acute gastritis without hemorrhage   . Osteoarthritis 04/29/2018  . Hiatal hernia with GERD 04/29/2018  . Hyperlipidemia 04/29/2018  . Lymphedema of both lower extremities 04/29/2018  . Left knee pain 10/23/2016  . Cataracts, bilateral 07/31/2016  . Glaucoma 07/31/2016  . Lateral epicondylitis of left elbow 10/20/2015  . Vitamin D deficiency 10/20/2015  . Type 2 diabetes, controlled, with neuropathy ([place]) 10/16/2015  . Gouty arthritis 06/03/2015  . H/O syncope 06/03/2015  . Mild intermittent asthma 06/03/2015  . Multiple gastric ulcers 06/03/2015  . Schatzki's ring 06/03/2015  . Undifferentiated connective tissue disease ([place]) 06/03/2015  . Bone disorder 04/05/2015  . Dental injury 04/05/2015  . Pericarditis 04/05/2015  . Sjogren's syndrome ([place]) 09/07/2014  . Elevated alkaline phosphatase level 08/24/2014  . Lactose intolerance 08/24/2014  . Obesity 08/24/2014  . Peripheral edema 08/24/2014  . 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 Abdominal adhesions 08/02/2014  . 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[place]) 08/02/2014  . Degenerative joint disease (DJD) of lumbar spine 08/02/2014  . Diverticulosis of both small and large intestine 08/02/2014  . Hyperuricemia 08/02/2014  . Pure hypercholesterolemia 08/02/2014  . Trigger finger 08/02/2014  . Right shoulder pain 07/29/2014  . Benign neoplasm of colon 06/30/2010  . Right upper quadrant pain 06/30/2010    Lieutenant Diego PT, DPT 4:05 PM,06/21/20   [place] [facility] [address] [place], [place], [address] Phone: [phone]   Fax:  [phone]  Name: [name] MRN: [id number] Date of Birth: August 16, 1945

## 2020-06-22 ENCOUNTER — Ambulatory Visit: Payer: Medicare Other | Admitting: Occupational Therapy

## 2020-06-22 DIAGNOSIS — I890 Lymphedema, not elsewhere classified: Secondary | ICD-10-CM | POA: Diagnosis not present

## 2020-06-22 NOTE — Therapy (Addendum)
[name] MAIN [facility] SERVICES [address] [place], [place], [address] Phone: [phone]   Fax:  [phone]  Occupational Therapy Treatment  Patient Details  Name: [name] MRN: [id number] Date of Birth: [birth year] Referring Provider (OT): Patricia Rogue, MD   Encounter Date: 06/22/2020   OT End of Session - 06/22/20 1604    Visit Number 29   Number of Visits 36    Date for OT Re-Evaluation 08/07/20    OT Start Time 0200    OT Stop Time 0300    OT Time Calculation (min) 60 min    Activity Tolerance Patient tolerated treatment well    Behavior During Therapy [facility] for tasks assessed/performed           Past Medical History:  Diagnosis Date  . Anterolisthesis    Cervical spine  . Asthma   . Connective tissue disorder (HCC)    recurrent carotid arteritis, temporal arteritis, vasculitis mandible, general hepatitis, avascular necrosis bilat  . DDD (degenerative disc disease), lumbar   . Diabetes mellitus without complication ([place])   . Diverticulosis   . Duodenitis   . Gouty arthritis   . Hiatal hernia with GERD   . History of cardiovascular stress test    a. 07/2018 MV [facility]): Fixed inferoapical defect w/ nl contraction-->attenuation artifact. No ischemia. EF 63%.  . Hyperlipidemia   . Hypertension    a. 02/2019 Renal artery duplex: no evidence of prox RAS.  Marland Kitchen Hyperuricemia   . Keratitis sicca, bilateral ([place])   . Lymphedema of both lower extremities   . Osteoarthritis   . Pericarditis    Recurrent: Aug 97, July 05, Sept 08, July 16, July 18  . PUD (peptic ulcer disease)   . 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[place])   . Sjogren's syndrome ([place])   . Syncope   . Tendonitis, Achilles, right   . Tortuous colon   . Trigger finger     Past Surgical History:  Procedure Laterality Date  . ABDOMINAL HYSTERECTOMY    . BREAST BIOPSY    . BREAST EXCISIONAL BIOPSY Left 1986   neg  . CHOLECYSTECTOMY    . COLONOSCOPY WITH PROPOFOL  N/A 08/26/2018   Procedure: COLONOSCOPY WITH PROPOFOL;  Surgeon: Patricia Lame, MD;  Location: [facility] ENDOSCOPY;  Service: Endoscopy;  Laterality: N/A;  . CYSTOSCOPY  02/26/2018   Patricia Puls, MD   . distal arthrectomy    . ESOPHAGOGASTRODUODENOSCOPY (EGD) WITH PROPOFOL N/A 08/26/2018   Procedure: ESOPHAGOGASTRODUODENOSCOPY (EGD) WITH PROPOFOL;  Surgeon: Patricia Lame, MD;  Location: [facility] ENDOSCOPY;  Service: Endoscopy;  Laterality: N/A;  . EXCISION NEUROMA    . KNEE SURGERY Bilateral    1985, 2001, 11/2002, 12/2006  . panhysterectomy  10/1983  . SHOULDER SURGERY Right    reverse total arthroplasty w/ biceps tenodesis  . TONSILLECTOMY AND ADENOIDECTOMY    . TOTAL HIP ARTHROPLASTY Right 04/2007  . WISDOM TOOTH EXTRACTION      There were no vitals filed for this visit.   Subjective Assessment - 06/22/20 1402    Subjective  [name] presents for OT Rx visit  29/36 to address BLE lymphedema. Pt's presents wearing initial custom compression knee high on LLE, and RLE compression wraps. Pt has no new complaints.    Currently in Pain? No/denies    Pain Onset --   30 yrs                       OT Treatments/Exercises (  OP) - 06/22/20 0001      ADLs   ADL Education Given Yes      Manual Therapy   Manual Therapy Edema management;Compression Bandaging;Manual Lymphatic Drainage (MLD)    Edema Management Completed initial anatomical measurements for RLE compression garment.    Manual Lymphatic Drainage (MLD) MLD to RLE as established    Compression Bandaging RLE compression wraps. LLE garment not doffed.                  OT Education - 06/22/20 1604    Education Details Continued skilled Pt/caregiver education  And LE ADL training throughout visit for lymphedema self care/ home program, including compression wrapping, compression garment and device wear/care, lymphatic pumping ther ex, simple self-MLD, and skin care. Discussed progress towards goals.    Person(s)  Educated Patient    Methods Explanation;Demonstration;Handout    Comprehension Verbalized understanding;Returned demonstration               OT Long Term Goals - 06/01/20 1441      OT LONG TERM GOAL #1   Title With modified independent (extra time) Pt will be able to apply knee length, multi-layer, short stretch compression wraps daily from ankle to tibial tuberosity on one leg at a time using correct gradient techniques to return affected limb/s, as closely as possible, to premorbid size and shape, to limit leg pain and infection risk, and to improve safe functional mobility and ADLs performance.    Baseline Dependent    Time 4    Period Days    Status Achieved   achieved 4th visit     OT LONG TERM GOAL #2   Title Pt will be able to verbalize signs and symptoms of cellulitis infection and identify lymphedema precautions using printed resource (modified independence) for reference to decrease infection risk and limit LE progression over time.    Baseline Max A    Time 4    Period Days    Status Achieved      OT LONG TERM GOAL #3   Title Pt will sustain a least 85% compliance with all daily LE self-care home program components throughout Intensive Phase CDT, including impeccable skin care, lymphatic pumping ther ex,  compression wraps and simple self MLD, to ensure optimal limb volume reduction, to limit infection risk and to limit LE progression.    Baseline dependent    Time 12    Period Weeks    Status Achieved   "I'm 98% !"     OT LONG TERM GOAL #4   Title Using assistive devices (modified independence)  Pt will be able to don and doff appropriate daytime compression garments to limit edema re-accumulation and further progression by issue date.    Baseline Max A    Time 12    Period Weeks    Status Achieved      OT LONG TERM GOAL #5   Title After skilled teaching Pt  will be able to perform all lymphedema self-care home program components with modified independence (extra  time, assistive devices PRN) , including simple self MLD, lymphatic pumping exercises, don, doff and wear compression garments  daily, sustain   impeccable skin care daily, to limit lymphedema progression and infection risk.    Baseline Max A    Time 12    Period Weeks    Status Achieved   Pt is trained for comrpession wrapping skin care and ther ex. She is still working on Immunologist simple self  MLD as of 03/21/20     OT LONG TERM GOAL #6   Title Pt will increase score on FOTO 10 points, from 45 to 55/100, to improve functional performance of basic ADLs.    Baseline Max A    Time 12    Period Weeks    Status On-going                 Plan - 06/22/20 1605    Clinical Impression Statement RLE volume reduction below the knee appears to have reached clinical plateau. Completed initial anatomical measurements for RLE custom compression garment.Remainder of session spent on manual therapy and reapplied compression wraps. Good tolerance for all aspects of therapy without increased pain. Cont as per POC.          OT Occupational Profile and History Comprehensive Assessment- Review of records and extensive additional review of physical, cognitive, psychosocial history related to current functional performance   Occupational performance deficits (Please refer to evaluation for details): ADL's;IADL's;Social Participation;Work;Leisure;Other   body image  Body Structure / Function / Physical Skills ADL;Decreased knowledge of precautions;ROM;Balance;Decreased knowledge of use of DME;Mobility;Edema;Skin integrity;Pain;IADL   Rehab Potential Good   Clinical Decision Making Several treatment options, min-mod task modification necessary   Comorbidities Affecting Occupational Performance: Presence of comorbidities impacting occupational performance   Modification or Assistance to Complete Evaluation  Min-Moderate modification of tasks or assist with assess necessary to complete eval   OT Frequency 2x /  week   OT Duration 12 weeks   and PRN  OT Treatment/Interventions Self-care/ADL training;Therapeutic exercise;Manual lymph drainage;Compression bandaging;Patient/family education;Other (comment);Therapeutic activities;DME and/or AE instruction;Manual Therapy   Plan Intensive Phase CDT: 1 leg at a time to limit fall risk. MLD, skin care, ther ex , cimpression wraps -> daytime garments, HOS device, consider Flexitouch if appropriate   Recommended Other Services Fit w/ appropriate compression garments that are effective, comfortable and  Pt can don using AE and extra time   Consulted and Agree with Plan of Care Patient         Patient will benefit from skilled therapeutic intervention in order to improve the following deficits and impairments:           Visit Diagnosis: Lymphedema, not elsewhere classified    Problem List Patient Active Problem List   Diagnosis Date Noted  . Nausea 03/31/2020  . Multiple thyroid nodules 12/28/2019  . Thyroid nodule 09/29/2019  . Lymphedema 11/10/2018  . PAD (peripheral artery disease) ([place]) 10/12/2018  . Aortic atherosclerosis ([place]) 10/12/2018  . Carotid stenosis, asymptomatic, bilateral 10/12/2018  . Positive colorectal cancer screening using Cologuard test 10/09/2018  . Gastroesophageal reflux disease   . Acute gastritis without hemorrhage   . Osteoarthritis 04/29/2018  . Hiatal hernia with GERD 04/29/2018  . Hyperlipidemia 04/29/2018  . Lymphedema of both lower extremities 04/29/2018  . Left knee pain 10/23/2016  . Cataracts, bilateral 07/31/2016  . Glaucoma 07/31/2016  . Lateral epicondylitis of left elbow 10/20/2015  . Vitamin D deficiency 10/20/2015  . Type 2 diabetes, controlled, with neuropathy ([place]) 10/16/2015  . Gouty arthritis 06/03/2015  . H/O syncope 06/03/2015  . Mild intermittent asthma 06/03/2015  . Multiple gastric ulcers 06/03/2015  . Schatzki's ring 06/03/2015  . Undifferentiated connective tissue disease ([place])  06/03/2015  . Bone disorder 04/05/2015  . Dental injury 04/05/2015  . Pericarditis 04/05/2015  . Sjogren's syndrome ([place]) 09/07/2014  . Elevated alkaline phosphatase level 08/24/2014  . Lactose intolerance 08/24/2014  . Obesity 08/24/2014  . Peripheral edema  08/24/2014  . 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 Abdominal adhesions 08/02/2014  . 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[place]) 08/02/2014  . Degenerative joint disease (DJD) of lumbar spine 08/02/2014  . Diverticulosis of both small and large intestine 08/02/2014  . Hyperuricemia 08/02/2014  . Pure hypercholesterolemia 08/02/2014  . Trigger finger 08/02/2014  . Right shoulder pain 07/29/2014  . Benign neoplasm of colon 06/30/2010  . Right upper quadrant pain 06/30/2010   Andrey Spearman, MS, OTR/L, [facility] 06/22/20 4:07 PM   [place] MAIN [facility] SERVICES [address] [place], [place], [address] Phone: [phone]   Fax:  [phone]  Name: [name] MRN: [id number] Date of Birth: [birth year]

## 2020-06-23 ENCOUNTER — Ambulatory Visit: Payer: Medicare Other | Admitting: Physical Therapy

## 2020-06-23 ENCOUNTER — Other Ambulatory Visit: Payer: Self-pay

## 2020-06-23 DIAGNOSIS — M6281 Muscle weakness (generalized): Secondary | ICD-10-CM

## 2020-06-23 DIAGNOSIS — M25661 Stiffness of right knee, not elsewhere classified: Secondary | ICD-10-CM

## 2020-06-23 DIAGNOSIS — M25561 Pain in right knee: Secondary | ICD-10-CM

## 2020-06-23 DIAGNOSIS — G8929 Other chronic pain: Secondary | ICD-10-CM

## 2020-06-23 NOTE — Therapy (Unsigned)
[place] [facility] [address] [place], [place], [address] Phone: [phone]   Fax:  [phone]  Physical Therapy Treatment  Patient Details  Name: [name] MRN: [id number] Date of Birth: 1946-05-25 Referring Provider (PT): Dr. Harlow Mares   Encounter Date: 06/23/2020   PT End of Session - 06/23/20 1440    Visit Number 37    Number of Visits 41    Date for PT Re-Evaluation 07/05/20    Authorization - Visit Number 5    Authorization - Number of Visits 10    PT Start Time 1440    PT Stop Time 1534    PT Time Calculation (min) 54 min    Activity Tolerance Patient tolerated treatment well;Patient limited by pain    Behavior During Therapy [facility] for tasks assessed/performed           Past Medical History:  Diagnosis Date  . Anterolisthesis    Cervical spine  . Asthma   . Connective tissue disorder (HCC)    recurrent carotid arteritis, temporal arteritis, vasculitis mandible, general hepatitis, avascular necrosis bilat  . DDD (degenerative disc disease), lumbar   . Diabetes mellitus without complication ([place])   . Diverticulosis   . Duodenitis   . Gouty arthritis   . Hiatal hernia with GERD   . History of cardiovascular stress test    a. 07/2018 [facility]): Fixed inferoapical defect w/ nl contraction-->attenuation artifact. No ischemia. EF 63%.  . Hyperlipidemia   . Hypertension    a. 02/2019 Renal artery duplex: no evidence of prox RAS.  Marland Kitchen Hyperuricemia   . Keratitis sicca, bilateral ([place])   . Lymphedema of both lower extremities   . Osteoarthritis   . Pericarditis    Recurrent: Aug 97, July 05, Sept 08, July 16, July 18  . PUD (peptic ulcer disease)   . 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[place])   . Sjogren's syndrome ([place])   . Syncope   . Tendonitis, Achilles, right   . Tortuous colon   . Trigger finger     Past Surgical History:  Procedure Laterality Date  . ABDOMINAL HYSTERECTOMY    . BREAST BIOPSY    . BREAST EXCISIONAL  BIOPSY Left 1986   neg  . CHOLECYSTECTOMY    . COLONOSCOPY WITH PROPOFOL N/A 08/26/2018   Procedure: COLONOSCOPY WITH PROPOFOL;  Surgeon: Lucilla Lame, MD;  Location: [facility] ENDOSCOPY;  Service: Endoscopy;  Laterality: N/A;  . CYSTOSCOPY  02/26/2018   [name], MD   . distal arthrectomy    . ESOPHAGOGASTRODUODENOSCOPY (EGD) WITH PROPOFOL N/A 08/26/2018   Procedure: ESOPHAGOGASTRODUODENOSCOPY (EGD) WITH PROPOFOL;  Surgeon: Lucilla Lame, MD;  Location: [facility] ENDOSCOPY;  Service: Endoscopy;  Laterality: N/A;  . EXCISION NEUROMA    . KNEE SURGERY Bilateral    1985, 2001, 11/2002, 12/2006  . panhysterectomy  10/1983  . SHOULDER SURGERY Right    reverse total arthroplasty w/ biceps tenodesis  . TONSILLECTOMY AND ADENOIDECTOMY    . TOTAL HIP ARTHROPLASTY Right 04/2007  . WISDOM TOOTH EXTRACTION      There were no vitals filed for this visit.   Subjective Assessment - 06/23/20 1451    Subjective Pt reports 4/10 pain across her LB today, which is normal for her. Pt states that stairs have gotten easier for her since starting therapy. Pt states she can stand for less than 30 minutes while cooking before she needs to sit or lay down d/t the pain in her mid back and LB.  Pertinent History Pt. from [place] and came to [place] to take care of friend and then Covid happened.  Pt. retired.  Pts. ex-husband lives in her house in [place].  R shoulder limitations (5# lifting restrictions).  Pt. went to [facility] for 10 years (every week) with benefits reported in low back.    Limitations Standing;Walking;Writing    Patient Stated Goals Decrease back pain/ improve strength/ mobility.    Currently in Pain? Yes    Pain Score 4     Pain Location Back    Pain Orientation Lower    Pain Type Chronic pain              There.ex.:        Nustep L4 10 min. B LE (consistent cadence)- Good endurance.  BP assessed after nustep: 142/78  Walking in PT clinic/ hallway with posture feedback/ consistent step pattern (fatigue  noted).     Walking in //-bars (6x)- forward, lateral, high knees. Pharmacist, hospital.   // bar static lunges  2x10      Discussed home activity/ HEP  Manual tx.:   Seated STM to low back. Significant tenderness in R T10-L2 paraspinal musculature.  Supine R HS stretch and R hip PROM in all directions. Reassessment of R knee soft tissue: R distal hip adductor TTP.          PT Long Term Goals - 06/07/20 1933      PT LONG TERM GOAL #1   Title Pt. will improve FOTO to predicted score to demonstrate improvement in pain free functional mobility.    Baseline 11/11: TBD    Time 4    Period Weeks    Status New    Target Date 07/05/20      PT LONG TERM GOAL #2   Title Pt. will improve R knee flexion AROM to at least 120 deg to improve stair climbing ability without compensations.    Baseline 11/11: R knee flex AROM: 105 deg    Time 4    Period Weeks    Status On-going    Target Date 07/05/20      PT LONG TERM GOAL #3   Title Pt. will improve R knee flexion strength to 5/5 without pain to improve pain free functional mobility.    Baseline 11/11: R knee flex: 4+/5 with pain    Time 4    Period Weeks    Status Not Met    Target Date 07/05/20      PT LONG TERM GOAL #4   Title Pt. will report 4/10 low back pain at worst with no radicular symptoms to improve ADLs/ household chores.    Baseline Pt. reports no pain in back currently at rest but >6/10 pain with increase activity.; 10/7: Pain is > 5/10 NPS with household chores.    Time 4    Period Weeks    Status Partially Met    Target Date 07/05/20                 Plan - 06/23/20 1518    Clinical Impression Statement Pt demos decreased endurance and strength of her B LE evident by fatigue noted during ex and deficits in MMT. Decreased LE strength r/i decreased ability to tolerate prolonged standing tasks over 30 min such as cooking. Pt also presents with trigger points in R lumbar paraspinals, which increases stress on the  lumbar spine and pain with functional movements. Pt was able to transfer off of plinth with  less difficulty as compared to previous visits.    Personal Factors and Comorbidities Education;Comorbidity 3+    Comorbidities AAA, AVN, HTN, severe LE edema bilat (L>R)    Examination-Activity Limitations Locomotion Level;Squat;Stairs;Stand    Examination-Participation Restrictions Yard Work;Driving;Community Activity    Stability/Clinical Decision Making Evolving/Moderate complexity    Clinical Decision Making Moderate    Rehab Potential Fair    PT Frequency 2x / week    PT Duration 4 weeks    PT Treatment/Interventions ADLs/Self Care Home Management;Electrical Stimulation;Moist Heat;Gait training;Stair training;Functional mobility training;Neuromuscular re-education;Balance training;Therapeutic exercise;Therapeutic activities;Patient/family education;Manual techniques;Passive range of motion    PT Next Visit Plan Issue new HEP next tx. for LE strengthening/ quad stability.   Reassess prone spinal mobility.    Consulted and Agree with Plan of Care Patient           Patient will benefit from skilled therapeutic intervention in order to improve the following deficits and impairments:  Abnormal gait,Pain,Improper body mechanics,Postural dysfunction,Decreased mobility,Decreased activity tolerance,Decreased endurance,Decreased range of motion,Decreased strength,Impaired UE functional use,Impaired flexibility,Difficulty walking  Visit Diagnosis: Muscle weakness (generalized)  Right knee pain, unspecified chronicity  Decreased range of motion of right knee  Chronic bilateral low back pain without sciatica     Problem List Patient Active Problem List   Diagnosis Date Noted  . Nausea 03/31/2020  . Multiple thyroid nodules 12/28/2019  . Thyroid nodule 09/29/2019  . Lymphedema 11/10/2018  . PAD (peripheral artery disease) ([place]) 10/12/2018  . Aortic atherosclerosis ([place]) 10/12/2018  . Carotid  stenosis, asymptomatic, bilateral 10/12/2018  . Positive colorectal cancer screening using Cologuard test 10/09/2018  . Gastroesophageal reflux disease   . Acute gastritis without hemorrhage   . Osteoarthritis 04/29/2018  . Hiatal hernia with GERD 04/29/2018  . Hyperlipidemia 04/29/2018  . Lymphedema of both lower extremities 04/29/2018  . Left knee pain 10/23/2016  . Cataracts, bilateral 07/31/2016  . Glaucoma 07/31/2016  . Lateral epicondylitis of left elbow 10/20/2015  . Vitamin D deficiency 10/20/2015  . Type 2 diabetes, controlled, with neuropathy ([place]) 10/16/2015  . Gouty arthritis 06/03/2015  . H/O syncope 06/03/2015  . Mild intermittent asthma 06/03/2015  . Multiple gastric ulcers 06/03/2015  . Schatzki's ring 06/03/2015  . Undifferentiated connective tissue disease ([place]) 06/03/2015  . Bone disorder 04/05/2015  . Dental injury 04/05/2015  . Pericarditis 04/05/2015  . Sjogren's syndrome ([place]) 09/07/2014  . Elevated alkaline phosphatase level 08/24/2014  . Lactose intolerance 08/24/2014  . Obesity 08/24/2014  . Peripheral edema 08/24/2014  . 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 Abdominal adhesions 08/02/2014  . 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[place]) 08/02/2014  . Degenerative joint disease (DJD) of lumbar spine 08/02/2014  . Diverticulosis of both small and large intestine 08/02/2014  . Hyperuricemia 08/02/2014  . Pure hypercholesterolemia 08/02/2014  . Trigger finger 08/02/2014  . Right shoulder pain 07/29/2014  . Benign neoplasm of colon 06/30/2010  . Right upper quadrant pain 06/30/2010   Pura Spice, PT, DPT # [id number] Kayleen Memos, SPT 06/24/2020, 7:47 AM  [place] [facility] [address] [place], [place], [address] Phone: [phone]   Fax:  [phone]  Name: [name] MRN: [id number] Date of Birth: 1946/03/23

## 2020-06-24 ENCOUNTER — Telehealth: Payer: Self-pay | Admitting: Cardiovascular Disease

## 2020-06-24 NOTE — Telephone Encounter (Signed)
Patient calling  Wants to discuss an internist options - would like to speak with [name] Please call to discuss

## 2020-06-24 NOTE — Telephone Encounter (Signed)
Spoke with patient and she wanted to know how to find an internist to manage her care. She is frustrated that she has had to see multiple doctors for various referrals. Provided her with number to [facility] services to get their assistance in finding a provider for her. She was very appreciative for the call, number, and assistance in trying to find a provider for her care. She had no further questions at this time.

## 2020-06-27 ENCOUNTER — Ambulatory Visit: Payer: Medicare Other | Admitting: Cardiovascular Disease

## 2020-06-28 ENCOUNTER — Telehealth: Payer: Self-pay | Admitting: Cardiovascular Disease

## 2020-06-28 NOTE — Telephone Encounter (Signed)
LMTCB, pt c/o HTN this am when she woke up, questions about BP medications.

## 2020-06-28 NOTE — Telephone Encounter (Signed)
Patient calling States that all her BP medication is not working Would like to discuss possible changes This morning when she woke up BP was 175/86 Please call to discuss

## 2020-06-29 ENCOUNTER — Ambulatory Visit: Payer: Medicare Other | Admitting: Occupational Therapy

## 2020-06-29 NOTE — Telephone Encounter (Signed)
Patient calling to discuss bp issues.

## 2020-06-30 ENCOUNTER — Other Ambulatory Visit: Payer: Self-pay

## 2020-06-30 ENCOUNTER — Ambulatory Visit: Payer: Medicare Other | Admitting: Physical Therapy

## 2020-06-30 DIAGNOSIS — G8929 Other chronic pain: Secondary | ICD-10-CM

## 2020-06-30 DIAGNOSIS — M6281 Muscle weakness (generalized): Secondary | ICD-10-CM | POA: Diagnosis not present

## 2020-06-30 DIAGNOSIS — M25561 Pain in right knee: Secondary | ICD-10-CM

## 2020-06-30 DIAGNOSIS — M25661 Stiffness of right knee, not elsewhere classified: Secondary | ICD-10-CM

## 2020-06-30 NOTE — Telephone Encounter (Signed)
Patient is returning your call.  

## 2020-06-30 NOTE — Telephone Encounter (Addendum)
Patient called stating that her blood pressures are elevated into the 170's. Reviewed her cardiology medications in detail and she is only taking 1/2 tablet of Losartan, nebivolol 1/2 tablet twice a day, and clonidine 0.1 mg twice a day when greater than 160. She said most medications were different due to her sensitivity and that provider was helping her get enough for decreased price. Reviewed that she should take Losartan 1/2 tablet twice a day (due to her concerns on sensitivity), continue nevivolol 1/2 tablet twice a day, and that she can take clonidine 0.1 mg up to three times a day. Patient reports that medications are different doses than what she takes at home due to provider sent in higher doses to allow some extra for insurance purposes. We discussed how that does not allow me to know exactly what she is taking at home based on what we have listed to help with potential increases for better blood pressure control. She verbalized understanding of this and mentioned how it can get confusing. Offered appointment for Monday with provider to review her persistent elevated blood pressure readings, medication review, and confirmed her appointment. She was very appreciative for the call back. Also reviewed that if she had any problems over the weekend we do have someone on call if these elevated readings persist. Also reviewed signs and symptoms which would require immediate evaluation in the ED. She verbalized understanding of our conversation, agreement with plan, and had no further questions at this time.

## 2020-07-03 NOTE — Progress Notes (Signed)
Cardiology Office Note  Date:  07/04/2020   ID:  [name], [name] 04-10-1946, MRN [id number]  PCP:  Verl Bangs, FNP   Chief Complaint  Patient presents with  . Other    Elevated BP. Meds reviewed verbally with patient.     HPI:  [name] is a 75 year old woman with past medical history of Former smoker  PAD/carotid HTN urinary incontinence, chronic UTIs Morbid obesity Moderate to large hiatal hernia. bleeding ulcers x 3, Aortic atherosclerosis on CT scan Presenting for  resistant hypertension, PAD  Numerous issues to discuss today Doing wraps on legs, left with TED hose Wrap on right Has lymph pumps Venous insuff noted  Can't do lasix, allergy  BP up an down, not taking clonidine on a regular basis Blood pressures are labile Previously reported some dry mouth fatigue on clonidine but was tolerating the medication Stays up to 1 AM Sleeps to 11 Am  Sore everywhere to touch Etiology unclear  Labs reviewed Total chol 155, LDL 64 WBC 12  HGBA1C 8.5, trending upwards  Knee pain, seen by [facility]  Current blood pressure medications Clonidine prn (was supposed to be BID) Losartan 50 daily bystolic 10 twice a day  Seen in the hospital September 10, 2019 at [place] scan at [facility] Mild atherosclerosis in the thoracic aorta. At the lateral aspect of the  aortic arch, there is a small peripherally calcified focal outpouching,  most suggestive of a small saccular aneurysm, or less favored, a  penetrating aortic ulcer.   Intolerance of HCTZ, sulfa Unable to tolerate calcium channel blocker secondary to leg swelling and lymphedema  EKG personally reviewed by myself on todays visit Shows normal sinus rhythm rate 60 bpm no significant ST or T wave changes  Other past medical hx  long history of pericarditis, pericardial effusion Previously managed by [place] being treated with colchicine in the past, Also previously treated with  long course of steroids for unspecified connective tissue disorder  CT scan 06/2018 Mild to moderate diffuse descending aortic atherosclerosis extending into the common iliac arteries Coronary calcification in the right coronary artery  Carotid u/s 05/2018 Less than 50% stenosis in the right and left internal carotid Arteries.   PMH:   has a past medical history of Anterolisthesis, Asthma, Connective tissue disorder ([place]), DDD (degenerative disc disease), lumbar, Diabetes mellitus without complication ([place]), Diverticulosis, Duodenitis, Gouty arthritis, Hiatal hernia with GERD, History of cardiovascular stress test, Hyperlipidemia, Hypertension, Hyperuricemia, Keratitis sicca, bilateral ([place]), Lymphedema of both lower extremities, Osteoarthritis, Pericarditis, PUD (peptic ulcer disease), 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[place]), Sjogren's syndrome ([place]), Syncope, Tendonitis, Achilles, right, Tortuous colon, and Trigger finger.  PSH:    Past Surgical History:  Procedure Laterality Date  . ABDOMINAL HYSTERECTOMY    . BREAST BIOPSY    . BREAST EXCISIONAL BIOPSY Left 1986   neg  . CHOLECYSTECTOMY    . COLONOSCOPY WITH PROPOFOL N/A 08/26/2018   Procedure: COLONOSCOPY WITH PROPOFOL;  Surgeon: Lucilla Lame, MD;  Location: [facility] ENDOSCOPY;  Service: Endoscopy;  Laterality: N/A;  . CYSTOSCOPY  02/26/2018   [name], MD   . distal arthrectomy    . ESOPHAGOGASTRODUODENOSCOPY (EGD) WITH PROPOFOL N/A 08/26/2018   Procedure: ESOPHAGOGASTRODUODENOSCOPY (EGD) WITH PROPOFOL;  Surgeon: Lucilla Lame, MD;  Location: [facility] ENDOSCOPY;  Service: Endoscopy;  Laterality: N/A;  . EXCISION NEUROMA    . KNEE SURGERY Bilateral    1985, 2001, 11/2002, 12/2006  . panhysterectomy  10/1983  . SHOULDER SURGERY Right  reverse total arthroplasty w/ biceps tenodesis  . TONSILLECTOMY AND ADENOIDECTOMY    . TOTAL HIP ARTHROPLASTY Right 04/2007  . WISDOM TOOTH EXTRACTION      Current Outpatient Medications  Medication Sig Dispense  Refill  . acetaminophen (TYLENOL) 500 MG tablet Take 500 mg by mouth daily as needed.    . ARTIFICIAL TEAR OP Apply to eye as needed.     . BD INSULIN SYRINGE U/F 31G X 5/16" 1 ML MISC USE AS DIRECTED. WITH HUMALOG 100 each 5  . BIOTIN PO Take 1 mg by mouth daily.     . Cholecalciferol (VITAMIN D3) 25 MCG (1000 UT) CAPS Take 4 capsules by mouth daily.     . clindamycin (CLEOCIN) 300 MG capsule Take 600 mg by mouth. 1 Hour before dental procedures    . cloNIDine (CATAPRES) 0.1 MG tablet Take 1 tablet (0.1 mg) by mouth three times daily 270 tablet 3  . COLCRYS 0.6 MG tablet Take 1 tablet (0.6 mg total) by mouth 2 (two) times daily as needed. For up to 3 days for pericarditis, or up to 7 days for gout or connective tissue disorder. 60 tablet 2  . cyclobenzaprine (FLEXERIL) 5 MG tablet Take 1 tablet (5 mg total) by mouth daily as needed for muscle spasms. 30 tablet 2  . ezetimibe (ZETIA) 10 MG tablet Take 1 tablet (10 mg total) by mouth daily. 90 tablet 3  . famotidine (PEPCID) 20 MG tablet Take 20 mg by mouth 2 (two) times daily.    Marland Kitchen FREESTYLE LITE test strip USE AS DIRECTED THREE TIMES DAILY FOR DIABETES 100 strip 4  . insulin degludec (TRESIBA FLEXTOUCH) 200 UNIT/ML FlexTouch Pen Inject 56 Units into the skin daily. 3 pen 11  . insulin lispro (HUMALOG) 100 UNIT/ML injection Correction scale: take 1 unit per 50 over 150 before meals up to three times daily.  Up to 20 units per day. 20 mL 1  . Insulin Pen Needle (PEN NEEDLES) 32G X 5 MM MISC 1 Device by Does not apply route daily. 100 each 3  . lactase (LACTAID) 3000 units tablet Take by mouth as needed.     . Lancets (FREESTYLE) lancets Must test x4/day    . losartan (COZAAR) 50 MG tablet Take 50 mg by mouth daily.    . Nebivolol HCl 20 MG TABS Take 10 mg by mouth in the morning and at bedtime.    . ondansetron (ZOFRAN-ODT) 4 MG disintegrating tablet Take 1 tablet (4 mg total) by mouth every 8 (eight) hours as needed for nausea or vomiting. 30  tablet 0  . rosuvastatin (CRESTOR) 10 MG tablet TAKE 1 TABLET BY MOUTH EVERY DAY 90 tablet 1   No current facility-administered medications for this visit.     Allergies:   Amlodipine, Aspirin, Bee venom, Ciprofloxacin, Codeine, Erythromycin, Glimepiride, Hydralazine, Hydrochlorothiazide, Hydrocodone, Hydrocodone-acetaminophen, Hydromorphone, Irbesartan, Irbesartan-hydrochlorothiazide, Lisinopril, Maxitrol [neomycin-polymyxin-dexameth], Metformin and related, Metformin hcl, Methylcellulose, Metoclopramide, Metoprolol, Neomycin-bacitracin zn-polymyx, Norvasc [amlodipine besylate], Nsaids, Omeprazole magnesium, Other, Oxycodone-acetaminophen, Penicillins, Pioglitazone, Poison sumac extract, Prilosec [omeprazole], Silver sulfadiazine, Sulfa antibiotics, Tape, Tetanus toxoid, Tramadol, and Betadine [povidone iodine]   Social History:  The patient  reports that she quit smoking about 24 years ago. She has never used smokeless tobacco. She reports current alcohol use. She reports that she does not use drugs.   Family History:   family history includes Cancer in her mother and sister; Congestive Heart Failure in her mother; Diabetes in her maternal grandmother and mother; Hyperlipidemia in  her mother; Hypertension in her mother; Non-Hodgkin's lymphoma in her sister.    Review of Systems: Review of Systems  Constitutional: Negative.   HENT: Negative.   Respiratory: Negative.   Cardiovascular: Negative.   Gastrointestinal: Negative.   Musculoskeletal: Negative.        Diffuse soreness  Neurological: Negative.   Psychiatric/Behavioral: Negative.   All other systems reviewed and are negative.   PHYSICAL EXAM: VS:  BP 138/80 (BP Location: Left Arm, Patient Position: Sitting, Cuff Size: Normal)   Pulse 60   Ht 5\' 9"  (1.753 m)   Wt 234 lb (106.1 kg)   SpO2 97%   BMI 34.56 kg/m  , BMI Body mass index is 34.56 kg/m.  Constitutional:  oriented to person, place, and time. No distress.      Recent Labs: 12/07/2019: ALT 20; BUN 20; Creatinine, Ser 1.05; Hemoglobin 14.9; Platelets 288; Potassium 4.6; Sodium 137; TSH 0.901    Lipid Panel Lab Results  Component Value Date   CHOL 155 12/07/2019   HDL 41 12/07/2019   LDLCALC 64 12/07/2019   TRIG 248 (H) 12/07/2019    Wt Readings from Last 3 Encounters:  07/04/20 234 lb (106.1 kg)  12/28/19 238 lb (108 kg)  12/22/19 240 lb 2 oz (108.9 kg)      ASSESSMENT AND PLAN:  Aortic atherosclerosis ([place]) Continue Crestor and Zetia Cholesterol at goal She is requesting repeat lab work, this has been ordered  PAD (peripheral artery disease) ([place]) aortic atherosclerosis, carotid calcification Medical management, no further testing needed  Essential hypertension Long list of medication intolerances For unclear reasons is not taking clonidine Recommend she restart clonidine 0.1 mg at least twice daily Stay on losartan 50 twice daily, continue bystolic 10 mg twice daily  Pure hypercholesterolemia Continue Crestor Zetia Stable  Lymphedema - Using her compression pumps for 1 hour daily Chronic lower extremity edema Symptoms doing much better  Abdominal swelling Does not want diuretic, reports intolerance  Diffuse body tenderness Etiology unclear Will defer to primary care  Diabetes type 2 Trend up in her A1c over the past 2 years We have encouraged continued exercise, careful diet management in an effort to lose weight. Recommend follow-up with endocrinology   Total encounter time more than 35 minutes  Greater than 50% was spent in counseling and coordination of care with the patient    No orders of the defined types were placed in this encounter.    Signed, Esmond Plants, M.D., Ph.D. 07/04/2020  [place], [place]

## 2020-07-04 ENCOUNTER — Ambulatory Visit (INDEPENDENT_AMBULATORY_CARE_PROVIDER_SITE_OTHER): Payer: Medicare Other | Admitting: Cardiovascular Disease

## 2020-07-04 ENCOUNTER — Encounter: Payer: Self-pay | Admitting: Cardiovascular Disease

## 2020-07-04 ENCOUNTER — Ambulatory Visit: Payer: Medicare Other | Admitting: Pharmacist

## 2020-07-04 ENCOUNTER — Other Ambulatory Visit: Payer: Self-pay

## 2020-07-04 VITALS — BP 138/80 | HR 60 | Ht 69.0 in | Wt 234.0 lb

## 2020-07-04 DIAGNOSIS — I700 Atherosclerosis of aorta: Secondary | ICD-10-CM | POA: Diagnosis not present

## 2020-07-04 DIAGNOSIS — E782 Mixed hyperlipidemia: Secondary | ICD-10-CM

## 2020-07-04 DIAGNOSIS — I6523 Occlusion and stenosis of bilateral carotid arteries: Secondary | ICD-10-CM

## 2020-07-04 DIAGNOSIS — I10 Essential (primary) hypertension: Secondary | ICD-10-CM

## 2020-07-04 DIAGNOSIS — I890 Lymphedema, not elsewhere classified: Secondary | ICD-10-CM

## 2020-07-04 DIAGNOSIS — E7800 Pure hypercholesterolemia, unspecified: Secondary | ICD-10-CM

## 2020-07-04 DIAGNOSIS — I7122 Aneurysm of the aortic arch, without rupture: Secondary | ICD-10-CM

## 2020-07-04 DIAGNOSIS — I712 Thoracic aortic aneurysm, without rupture: Secondary | ICD-10-CM | POA: Diagnosis not present

## 2020-07-04 DIAGNOSIS — I739 Peripheral vascular disease, unspecified: Secondary | ICD-10-CM

## 2020-07-04 DIAGNOSIS — E1140 Type 2 diabetes mellitus with diabetic neuropathy, unspecified: Secondary | ICD-10-CM

## 2020-07-04 NOTE — Patient Instructions (Signed)
Visit Information  Care Plan : PharmD - Medication Assistance  Updates made by Vella Raring, RPH since 07/04/2020 12:00 AM    Problem: Disease Progression     Long-Range Goal: Disease Progression Prevented or Minimized   Start Date: 07/04/2020  Expected End Date: 10/02/2020  This Visit's Progress: On track  Priority: High  Note:   Current Barriers:  . Financial Barriers: patient has [place] [employer] Preferred Part [place] insurance and reports copay for Tresiba, Humalog, Bystolic and Colcrys is cost prohibitive at this time o Patient previously enrolled in patient assistance for Tresiba, Humalog and Colcrys for 2021 calendar year  Pharmacist Clinical Goal(s):  Marland Kitchen Over the next 90 days, patient will verbalize ability to afford treatment regimen through collaboration with PharmD and provider.   Interventions: . 1:1 collaboration with [place], [name], FNP regarding development and update of comprehensive plan of care as evidenced by provider attestation and co-signature . Inter-disciplinary care team collaboration (see longitudinal plan of care) . Perform chart review. Note patient scheduled for appointment with Cardiologist today due to persistent elevated blood pressures o Patient confirms planning to attend appointment today and will bring blood pressure log with her to appointment  Medication Assistance . Received coordination of care message from [place] who reports that she has been unable to get in touch with patient after 3 outreach attempts to follow up about assistance paperwork. . Today patient reports that she was waiting on financial statement for patient assistance applications, but has now mailed applications as well as required financial documents back to [place] this past weekend . Will continue to collaborate with [facility] CPh[facility] Susy Frizzle for aid to patient with applying for patient assistance for [place] from [employer], Humalog from [place], [employer] from [place] from [place] for 8657 calendar year   Patient Goals/Self-Care Activities . Over the next 90 days, patient will:  - collaborate with provider on medication access solutions  Follow Up Plan: Telephone follow up appointment with care management team member scheduled for: 2/28 at 12:30 pm       The patient verbalized understanding of instructions, educational materials, and care plan provided today and declined offer to receive copy of patient instructions, educational materials, and care plan.   Harlow Asa, PharmD, [place] [phone]

## 2020-07-04 NOTE — Patient Instructions (Addendum)
Number for [facility] to establish a primary care provider [phone]  Medication Instructions:  Try clonidine 0.1 mg twice a day  If you need a refill on your cardiac medications before your next appointment, please call your pharmacy.    Lab work: Lipid, CBC, CMP, TSH  Will call with results.   Testing/Procedures: Carotid u/s  Your physician has requested that you have a carotid duplex. This test is an ultrasound of the carotid arteries in your neck. It looks at blood flow through these arteries that supply the brain with blood. Allow one hour for this exam. There are no restrictions or special instructions.     Follow-Up: At [facility], you and your health needs are our priority.  As part of our continuing mission to provide you with exceptional heart care, we have created designated Provider Care Teams.  These Care Teams include your primary Cardiologist (physician) and Advanced Practice Providers (APPs -  Physician Assistants and Nurse Practitioners) who all work together to provide you with the care you need, when you need it.  . You will need a follow up appointment as needed  . Providers on your designated Care Team:   . Murray Hodgkins, NP . Christell Faith, PA-C . Marrianne Mood, PA-C  Any Other Special Instructions Will Be Listed Below (If Applicable).  COVID-19 Vaccine Information can be found at: ShippingScam.co.uk For questions related to vaccine distribution or appointments, please email [email or web address] or call [phone].

## 2020-07-04 NOTE — Therapy (Signed)
[facility] [address] [place], [place], [address] Phone: [phone]   Fax:  [phone]  Physical Therapy Treatment  Patient Details  Name: [name] MRN: [id number] Date of Birth: May 30, 1946 Referring Provider (PT): Dr. Harlow Mares   Encounter Date: 06/30/2020   PT End of Session - 07/04/20 1929    Visit Number 38    Number of Visits 41    Date for PT Re-Evaluation 07/05/20    Authorization - Visit Number 6    Authorization - Number of Visits 10    PT Start Time 0488    PT Stop Time 8916    PT Time Calculation (min) 56 min    Activity Tolerance Patient tolerated treatment well;Patient limited by pain    Behavior During Therapy [facility] for tasks assessed/performed           Past Medical History:  Diagnosis Date  . Anterolisthesis    Cervical spine  . Asthma   . Connective tissue disorder (HCC)    recurrent carotid arteritis, temporal arteritis, vasculitis mandible, general hepatitis, avascular necrosis bilat  . DDD (degenerative disc disease), lumbar   . Diabetes mellitus without complication ([place])   . Diverticulosis   . Duodenitis   . Gouty arthritis   . Hiatal hernia with GERD   . History of cardiovascular stress test    a. 07/2018 [facility]): Fixed inferoapical defect w/ nl contraction-->attenuation artifact. No ischemia. EF 63%.  . Hyperlipidemia   . Hypertension    a. 02/2019 Renal artery duplex: no evidence of prox RAS.  Marland Kitchen Hyperuricemia   . Keratitis sicca, bilateral ([place])   . Lymphedema of both lower extremities   . Osteoarthritis   . Pericarditis    Recurrent: Aug 97, July 05, Sept 08, July 16, July 18  . PUD (peptic ulcer disease)   . 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[place])   . Sjogren's syndrome ([place])   . Syncope   . Tendonitis, Achilles, right   . Tortuous colon   . Trigger finger     Past Surgical History:  Procedure Laterality Date  . ABDOMINAL HYSTERECTOMY    . BREAST BIOPSY    . BREAST EXCISIONAL  BIOPSY Left 1986   neg  . CHOLECYSTECTOMY    . COLONOSCOPY WITH PROPOFOL N/A 08/26/2018   Procedure: COLONOSCOPY WITH PROPOFOL;  Surgeon: Lucilla Lame, MD;  Location: [facility] ENDOSCOPY;  Service: Endoscopy;  Laterality: N/A;  . CYSTOSCOPY  02/26/2018   [name], MD   . distal arthrectomy    . ESOPHAGOGASTRODUODENOSCOPY (EGD) WITH PROPOFOL N/A 08/26/2018   Procedure: ESOPHAGOGASTRODUODENOSCOPY (EGD) WITH PROPOFOL;  Surgeon: Lucilla Lame, MD;  Location: [facility] ENDOSCOPY;  Service: Endoscopy;  Laterality: N/A;  . EXCISION NEUROMA    . KNEE SURGERY Bilateral    1985, 2001, 11/2002, 12/2006  . panhysterectomy  10/1983  . SHOULDER SURGERY Right    reverse total arthroplasty w/ biceps tenodesis  . TONSILLECTOMY AND ADENOIDECTOMY    . TOTAL HIP ARTHROPLASTY Right 04/2007  . WISDOM TOOTH EXTRACTION      There were no vitals filed for this visit.   Subjective Assessment - 07/04/20 1925    Subjective Pt reports persistent low back discomfort, esp. with prolonged standing/ walking.  Pt. entered PT with shoes on bilateral feet and compression stockings to manage lymphadema.  Pt. states she is waiting to hear back from MD office about her elevated BP.  Pt. states she is hoping to hear from MD soon and wants  to discuss meds/ dosage.  BP: 170/82 (PT recommends limiting strenuous ther.ex. and focus on stretching/ STM.    Pertinent History Pt. from [place] and came to  to take care of friend and then Covid happened.  Pt. retired.  Pts. ex-husband lives in her house in [place].  R shoulder limitations (5# lifting restrictions).  Pt. went to [employer] for 10 years (every week) with benefits reported in low back.    Limitations Standing;Walking;Writing    Patient Stated Goals Decrease back pain/ improve strength/ mobility.    Currently in Pain? Yes    Pain Score 4     Pain Location Back    Pain Orientation Lower           There.ex.:  No Nustep today secondary to elevated BP (PT will wait until pt. Talks with MD  office)  Walking forward/ backwards/ lateral in //-bars 3x each with cuing to increase hip flexion/ step pattern/ upright posture with mirror feedback.  Light to no UE assist in //-bars.  No ankle weights.  Supine TrA muscle activation (tactile and verbal cuing) during hip abduction/ flexion/ partial bridging.  Manual tx.:  Supine L/R LE generalized stretches (focus on hamstring/ piriformis/ trunk rotn.).  Reassessment of R knee stability.   Seated STM to lumbar paraspinals at end of tx. (moderate tightness/ tenderness reported).        PT Long Term Goals - 06/07/20 1933      PT LONG TERM GOAL #1   Title Pt. will improve FOTO to predicted score to demonstrate improvement in pain free functional mobility.    Baseline 11/11: TBD    Time 4    Period Weeks    Status New    Target Date 07/05/20      PT LONG TERM GOAL #2   Title Pt. will improve R knee flexion AROM to at least 120 deg to improve stair climbing ability without compensations.    Baseline 11/11: R knee flex AROM: 105 deg    Time 4    Period Weeks    Status On-going    Target Date 07/05/20      PT LONG TERM GOAL #3   Title Pt. will improve R knee flexion strength to 5/5 without pain to improve pain free functional mobility.    Baseline 11/11: R knee flex: 4+/5 with pain    Time 4    Period Weeks    Status Not Met    Target Date 07/05/20      PT LONG TERM GOAL #4   Title Pt. will report 4/10 low back pain at worst with no radicular symptoms to improve ADLs/ household chores.    Baseline Pt. reports no pain in back currently at rest but >6/10 pain with increase activity.; 10/7: Pain is > 5/10 NPS with household chores.    Time 4    Period Weeks    Status Partially Met    Target Date 07/05/20                 Plan - 07/04/20 1930    Clinical Impression Statement Tx. progression and ther.ex. limited today by elevated systolic BP.  Pt. is waiting to hear back from MD office about increasing her BP meds.   Moderate lumbar paraspinal muscle tenderness/ tightness noted during STM in seated posture.  No resisted ther.ex. or Nustep today due to BP.  Pt. will benefit from returning to ther.ex. next tx. is BP under control prior to tx. session.  Pt. scheduled to have a f/u visit soon with Ortho MD to discuss POC for R knee OA.    Personal Factors and Comorbidities Education;Comorbidity 3+    Comorbidities AAA, AVN, HTN, severe LE edema bilat (L>R)    Examination-Activity Limitations Locomotion Level;Squat;Stairs;Stand    Examination-Participation Restrictions Yard Work;Driving;Community Activity    Stability/Clinical Decision Making Evolving/Moderate complexity    Clinical Decision Making Moderate    Rehab Potential Fair    PT Frequency 2x / week    PT Duration 4 weeks    PT Treatment/Interventions ADLs/Self Care Home Management;Electrical Stimulation;Moist Heat;Gait training;Stair training;Functional mobility training;Neuromuscular re-education;Balance training;Therapeutic exercise;Therapeutic activities;Patient/family education;Manual techniques;Passive range of motion    PT Next Visit Plan CHECK BP.   PT will recert goals on 8/30 tx. session.    Consulted and Agree with Plan of Care Patient           Patient will benefit from skilled therapeutic intervention in order to improve the following deficits and impairments:  Abnormal gait,Pain,Improper body mechanics,Postural dysfunction,Decreased mobility,Decreased activity tolerance,Decreased endurance,Decreased range of motion,Decreased strength,Impaired UE functional use,Impaired flexibility,Difficulty walking  Visit Diagnosis: Muscle weakness (generalized)  Right knee pain, unspecified chronicity  Decreased range of motion of right knee  Chronic bilateral low back pain without sciatica     Problem List Patient Active Problem List   Diagnosis Date Noted  . Nausea 03/31/2020  . Multiple thyroid nodules 12/28/2019  . Thyroid nodule  09/29/2019  . Lymphedema 11/10/2018  . PAD (peripheral artery disease) ([place]) 10/12/2018  . Aortic atherosclerosis ([place]) 10/12/2018  . Carotid stenosis, asymptomatic, bilateral 10/12/2018  . Positive colorectal cancer screening using Cologuard test 10/09/2018  . Gastroesophageal reflux disease   . Acute gastritis without hemorrhage   . Osteoarthritis 04/29/2018  . Hiatal hernia with GERD 04/29/2018  . Hyperlipidemia 04/29/2018  . Lymphedema of both lower extremities 04/29/2018  . Left knee pain 10/23/2016  . Cataracts, bilateral 07/31/2016  . Glaucoma 07/31/2016  . Lateral epicondylitis of left elbow 10/20/2015  . Vitamin D deficiency 10/20/2015  . Type 2 diabetes, controlled, with neuropathy ([place]) 10/16/2015  . Gouty arthritis 06/03/2015  . H/O syncope 06/03/2015  . Mild intermittent asthma 06/03/2015  . Multiple gastric ulcers 06/03/2015  . Schatzki's ring 06/03/2015  . Undifferentiated connective tissue disease ([place]) 06/03/2015  . Bone disorder 04/05/2015  . Dental injury 04/05/2015  . Pericarditis 04/05/2015  . Sjogren's syndrome ([place]) 09/07/2014  . Elevated alkaline phosphatase level 08/24/2014  . Lactose intolerance 08/24/2014  . Obesity 08/24/2014  . Peripheral edema 08/24/2014  . 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 Abdominal adhesions 08/02/2014  . 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[place]) 08/02/2014  . Degenerative joint disease (DJD) of lumbar spine 08/02/2014  . Diverticulosis of both small and large intestine 08/02/2014  . Hyperuricemia 08/02/2014  . Pure hypercholesterolemia 08/02/2014  . Trigger finger 08/02/2014  . Right shoulder pain 07/29/2014  . Benign neoplasm of colon 06/30/2010  . Right upper quadrant pain 06/30/2010   Pura Spice, PT, DPT # [phone] 07/04/2020, 7:39 PM  [facility] [address] [place], [place], [address] Phone: [phone]   Fax:   [phone]  Name: [name] MRN: [id number] Date of Birth: 1945-06-13

## 2020-07-04 NOTE — Chronic Care Management (AMB) (Signed)
Chronic Care Management Pharmacy Note  07/04/2020 Name:  [name] MRN:  [id number] DOB:  1946-05-07  Subjective: [name] is an 75 y.o. year old female who is a primary patient of Malfi, [name], FNP.  The CCM team was consulted for assistance with disease management and care coordination needs.    Engaged with patient by telephone for follow up visit in response to provider referral for pharmacy case management and/or care coordination services.   Consent to Services:  The patient was given information about Chronic Care Management services, agreed to services, and gave verbal consent prior to initiation of services.  Please see initial visit note for detailed documentation.   Objective:  Lab Results  Component Value Date   CREATININE 1.05 (H) 12/07/2019   CREATININE 0.99 (H) 02/10/2019   CREATININE 0.88 12/22/2018    Lab Results  Component Value Date   HGBA1C 8.5 (A) 03/29/2020       Component Value Date/Time   CHOL 155 12/07/2019 1652   TRIG 248 (H) 12/07/2019 1652   HDL 41 12/07/2019 1652   CHOLHDL 3.8 12/07/2019 1652   VLDL 50 (H) 12/07/2019 1652   LDLCALC 64 12/07/2019 1652   LDLCALC 100 (H) 02/10/2019 1053   LDLDIRECT 77.1 12/07/2019 1652     BP Readings from Last 3 Encounters:  04/28/20 135/61  04/26/20 (!) 138/58  04/21/20 (!) 146/70    Assessment: Review of patient past medical history, allergies, medications, health status, including review of consultants reports, laboratory and other test data, was performed as part of comprehensive evaluation and provision of chronic care management services.   SDOH:  (Social Determinants of Health) assessments and interventions performed: none   CCM Care Plan  Allergies  Allergen Reactions  . Amlodipine     Other reaction(s): Unknown  . Aspirin     Other reaction(s): Unknown  . Bee Venom   . Ciprofloxacin     Other reaction(s): Other (see comments), Unknown  . Codeine     Other  reaction(s): Unknown Must have pre medications before taking per patient report Includes all derivatives   . Erythromycin     Other reaction(s): Unknown, Unknown  . Glimepiride     Other reaction(s): Unknown  . Hydralazine     Other reaction(s): Unknown  . Hydrochlorothiazide     Other reaction(s): Dizziness or lightheadedness.  . Hydrocodone     Other reaction(s): Unknown Must have pre medications before taking per patient report  . Hydrocodone-Acetaminophen Nausea And Vomiting    MUST BE PREMEDICATED  . Hydromorphone Nausea And Vomiting    Other reaction(s): Unknown Must have pre medications before taking per patient report MUST BE PREMEDICATED   . Irbesartan     Other reaction(s): Unknown  . Irbesartan-Hydrochlorothiazide     Other reaction(s): Unknown  . Lisinopril     Other reaction(s): Unknown  . Maxitrol [Neomycin-Polymyxin-Dexameth]   . Metformin And Related   . Metformin Hcl     Other reaction(s): Unknown  . Methylcellulose     Other reaction(s): Unknown  . Metoclopramide Nausea And Vomiting    Other reaction(s): Unknown  . Metoprolol     Other reaction(s): Unknown  . Neomycin-Bacitracin Zn-Polymyx     Other reaction(s): Unknown  . Norvasc [Amlodipine Besylate]   . Nsaids Other (See Comments)    Other reaction(s): Unknown bleeding   . Omeprazole Magnesium     Other reaction(s): Unknown  . Other     Pt is allergic to  staples and surgical metals  . Oxycodone-Acetaminophen     Other reaction(s): Unknown, Unknown Must have pre medications before taking per patient report   . Penicillins     Other reaction(s): Unknown, Unknown  . Pioglitazone     Other reaction(s): Unknown  . Poison [employer]     Poison [place] and [place] also  . Prilosec [Omeprazole]   . Silver Sulfadiazine     Other reaction(s): Unknown  . Sulfa Antibiotics     Other reaction(s): Unknown, Unknown  . Tape     Other reaction(s): Unknown  . Tetanus Toxoid Other (See Comments)  .  Tramadol     Other reaction(s): Unknown Must have pre medications before taking per patient report  . Betadine [Povidone Iodine] Rash    Only topically when left on skin.    Medications Reviewed Today    Reviewed by Lieutenant Diego, PT (Physical Therapist) on 06/21/20 at 1520  Med List Status: <None>  Medication Order Taking? Sig Documenting Provider Last Dose Status Informant  acetaminophen (TYLENOL) 500 MG tablet [id number] No Take 500 mg by mouth daily as needed. [provider] Taking Active   ARTIFICIAL TEAR OP [id number] No Apply to eye as needed.  [provider] Taking Active   BD INSULIN SYRINGE U/F 31G X 5/16" 1 ML MISC [id number] No USE AS DIRECTED. WITH HUMALOG Mikey College, NP Taking Active   BIOTIN PO [id number] No Take 1 mg by mouth daily.  [provider] Taking Active   Cholecalciferol (VITAMIN D3) 25 MCG (1000 UT) CAPS [id number] No Take 4 capsules by mouth daily.  [provider] Taking Active   clindamycin (CLEOCIN) 300 MG capsule [id number] No Take 600 mg by mouth. 1 Hour before dental procedures [provider] Taking Active   cloNIDine (CATAPRES) 0.1 MG tablet [id number] No Take 1 tablet (0.1 mg) by mouth three times daily Gollan, Kathlene November, MD Taking Active   COLCRYS 0.6 MG tablet [id number] No Take 1 tablet (0.6 mg total) by mouth 2 (two) times daily as needed. For up to 3 days for pericarditis, or up to 7 days for gout or connective tissue disorder. Olin Hauser, DO Taking Active   cyclobenzaprine (FLEXERIL) 5 MG tablet [id number]  Take 1 tablet (5 mg total) by mouth daily as needed for muscle spasms. Verl Bangs, FNP  Active   ezetimibe (ZETIA) 10 MG tablet [id number] No Take 1 tablet (10 mg total) by mouth daily. Minna Merritts, MD Taking Active   famotidine (PEPCID) 20 MG tablet [id number] No Take 20 mg by mouth 2 (two) times daily. [provider] Taking Active   FREESTYLE LITE test strip  [id number] No USE AS DIRECTED THREE TIMES DAILY FOR DIABETES Malfi, [name], FNP Taking Active   insulin degludec (TRESIBA FLEXTOUCH) 200 UNIT/ML FlexTouch Pen [id number] No Inject 56 Units into the skin daily. Malfi, [name], FNP Taking Active   insulin lispro (HUMALOG) 100 UNIT/ML injection [id number] No Correction scale: take 1 unit per 50 over 150 before meals up to three times daily.  Up to 20 units per day. Mikey College, NP Taking Active   Insulin Pen Needle (PEN NEEDLES) 32G X 5 MM MISC [id number] No 1 Device by Does not apply route daily. Verl Bangs, FNP Taking Active   lactase (LACTAID) 3000 units tablet [id number] No Take by mouth as needed.  [provider] Taking Active   Lancets (FREESTYLE) lancets [id number] No Must test x4/day [provider] Taking Active   losartan (COZAAR) 100 MG tablet [id number] No Take 1 tablet (100 mg total) by mouth daily. Minna Merritts, MD Taking Active   nebivolol 20 MG TABS [id number] No Take 1 tablet (20 mg total) by mouth daily. Minna Merritts, MD Taking Active   ondansetron (ZOFRAN-ODT) 4 MG disintegrating tablet [id number]  Take 1 tablet (4 mg total) by mouth every 8 (eight) hours as needed for nausea or vomiting. Verl Bangs, FNP  Active   prednisoLONE acetate (PRED FORTE) 1 % ophthalmic suspension [id number] No INSTILL ONE DROP  as needed [provider] Taking Active   rosuvastatin (CRESTOR) 10 MG tablet [id number]  TAKE 1 TABLET BY MOUTH EVERY DAY Malfi, [name], FNP  Active           Patient Active Problem List   Diagnosis Date Noted  . Nausea 03/31/2020  . Multiple thyroid nodules 12/28/2019  . Thyroid nodule 09/29/2019  . Lymphedema 11/10/2018  . PAD (peripheral artery disease) ([place]) 10/12/2018  . Aortic atherosclerosis ([place]) 10/12/2018  . Carotid stenosis, asymptomatic, bilateral 10/12/2018  . Positive colorectal cancer screening using Cologuard test 10/09/2018  . Gastroesophageal reflux  disease   . Acute gastritis without hemorrhage   . Osteoarthritis 04/29/2018  . Hiatal hernia with GERD 04/29/2018  . Hyperlipidemia 04/29/2018  . Lymphedema of both lower extremities 04/29/2018  . Left knee pain 10/23/2016  . Cataracts, bilateral 07/31/2016  . Glaucoma 07/31/2016  . Lateral epicondylitis of left elbow 10/20/2015  . Vitamin D deficiency 10/20/2015  . Type 2 diabetes, controlled, with neuropathy ([place]) 10/16/2015  . Gouty arthritis 06/03/2015  . H/O syncope 06/03/2015  . Mild intermittent asthma 06/03/2015  . Multiple gastric ulcers 06/03/2015  . Schatzki's ring 06/03/2015  . Undifferentiated connective tissue disease ([place]) 06/03/2015  . Bone disorder 04/05/2015  . Dental injury 04/05/2015  . Pericarditis 04/05/2015  . Sjogren's syndrome ([place]) 09/07/2014  . Elevated alkaline phosphatase level 08/24/2014  . Lactose intolerance 08/24/2014  . Obesity 08/24/2014  . Peripheral edema 08/24/2014  . 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 Abdominal adhesions 08/02/2014  . 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[place]) 08/02/2014  . Degenerative joint disease (DJD) of lumbar spine 08/02/2014  . Diverticulosis of both small and large intestine 08/02/2014  . Hyperuricemia 08/02/2014  . Pure hypercholesterolemia 08/02/2014  . Trigger finger 08/02/2014  . Right shoulder pain 07/29/2014  . Benign neoplasm of colon 06/30/2010  . Right upper quadrant pain 06/30/2010    Conditions to be addressed/monitored: DMII, HTN and medication assistance  Care Plan : PharmD - Medication Assistance  Updates made by Vella Raring, RPH since 07/04/2020 12:00 AM    Problem: Disease Progression     Long-Range Goal: Disease Progression Prevented or Minimized   Start Date: 07/04/2020  Expected End Date: 10/02/2020  This Visit's Progress: On track  Priority: High  Note:   Current Barriers:  . Financial Barriers: patient has [place] [employer]  Preferred Part D insurance and reports copay for Tresiba, Humalog, Bystolic and Colcrys is cost prohibitive at this time o Patient previously enrolled in patient assistance for Tresiba, Humalog and Colcrys for 2021 calendar year  Pharmacist Clinical Goal(s):  Marland Kitchen Over the next 90 days, patient will verbalize ability to afford treatment regimen through collaboration with PharmD and provider.   Interventions: . 1:1 collaboration with Malfi, [name], FNP regarding development and update of comprehensive plan of care as evidenced by provider  attestation and co-signature . Inter-disciplinary care team collaboration (see longitudinal plan of care) . Perform chart review. Note patient scheduled for appointment with Cardiologist today due to persistent elevated blood pressures o Patient confirms planning to attend appointment today and will bring blood pressure log with her to appointment  Medication Assistance . Received coordination of care message from Stow Simcox who reports that she has been unable to get in touch with patient after 3 outreach attempts to follow up about assistance paperwork. . Today patient reports that she was waiting on financial statement for patient assistance applications, but has now mailed applications as well as required financial documents back to [place] this past weekend . Will continue to collaborate with [facility] CPh[facility] Susy Frizzle for aid to patient with applying for patient assistance for [place] from [employer], Humalog from [place], [employer] from [place] from [place] for 2585 calendar year   Patient Goals/Self-Care Activities . Over the next 90 days, patient will:  - collaborate with provider on medication access solutions  Follow Up Plan: Telephone follow up appointment with care management team member scheduled for: 2/28 at 12:30 pm     Follow Up:  Patient agrees to Care Plan and Follow-up.  Harlow Asa, PharmD, [place] [phone]

## 2020-07-05 ENCOUNTER — Encounter: Payer: Self-pay | Admitting: Physical Therapy

## 2020-07-05 ENCOUNTER — Ambulatory Visit: Payer: Medicare Other

## 2020-07-05 DIAGNOSIS — M6281 Muscle weakness (generalized): Secondary | ICD-10-CM | POA: Diagnosis not present

## 2020-07-05 DIAGNOSIS — M25661 Stiffness of right knee, not elsewhere classified: Secondary | ICD-10-CM

## 2020-07-05 DIAGNOSIS — G8929 Other chronic pain: Secondary | ICD-10-CM

## 2020-07-05 DIAGNOSIS — M25561 Pain in right knee: Secondary | ICD-10-CM

## 2020-07-05 NOTE — Therapy (Unsigned)
[facility] [address] [place], [place], [address] Phone: [phone]   Fax:  [phone]  Physical Therapy Treatment  Patient Details  Name: [name] MRN: [id number] Date of Birth: 23-Feb-1946 Referring Provider (PT): Dr. Harlow Mares   Encounter Date: 07/05/2020   PT End of Session - 07/05/20 1604    Visit Number 39    Number of Visits 41    Date for PT Re-Evaluation 07/05/20    Authorization - Visit Number 7    Authorization - Number of Visits 10    PT Start Time 1600    PT Stop Time 1640    PT Time Calculation (min) 40 min    Activity Tolerance Patient tolerated treatment well;Patient limited by pain    Behavior During Therapy [facility] for tasks assessed/performed           Past Medical History:  Diagnosis Date  . Anterolisthesis    Cervical spine  . Asthma   . Connective tissue disorder (HCC)    recurrent carotid arteritis, temporal arteritis, vasculitis mandible, general hepatitis, avascular necrosis bilat  . DDD (degenerative disc disease), lumbar   . Diabetes mellitus without complication ([place])   . Diverticulosis   . Duodenitis   . Gouty arthritis   . Hiatal hernia with GERD   . History of cardiovascular stress test    a. 07/2018 [facility]): Fixed inferoapical defect w/ nl contraction-->attenuation artifact. No ischemia. EF 63%.  . Hyperlipidemia   . Hypertension    a. 02/2019 Renal artery duplex: no evidence of prox RAS.  Marland Kitchen Hyperuricemia   . Keratitis sicca, bilateral ([place])   . Lymphedema of both lower extremities   . Osteoarthritis   . Pericarditis    Recurrent: Aug 97, July 05, Sept 08, July 16, July 18  . PUD (peptic ulcer disease)   . 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[place])   . Sjogren's syndrome ([place])   . Syncope   . Tendonitis, Achilles, right   . Tortuous colon   . Trigger finger     Past Surgical History:  Procedure Laterality Date  . ABDOMINAL HYSTERECTOMY    . BREAST BIOPSY    . BREAST EXCISIONAL  BIOPSY Left 1986   neg  . CHOLECYSTECTOMY    . COLONOSCOPY WITH PROPOFOL N/A 08/26/2018   Procedure: COLONOSCOPY WITH PROPOFOL;  Surgeon: Lucilla Lame, MD;  Location: [facility] ENDOSCOPY;  Service: Endoscopy;  Laterality: N/A;  . CYSTOSCOPY  02/26/2018   [name], MD   . distal arthrectomy    . ESOPHAGOGASTRODUODENOSCOPY (EGD) WITH PROPOFOL N/A 08/26/2018   Procedure: ESOPHAGOGASTRODUODENOSCOPY (EGD) WITH PROPOFOL;  Surgeon: Lucilla Lame, MD;  Location: [facility] ENDOSCOPY;  Service: Endoscopy;  Laterality: N/A;  . EXCISION NEUROMA    . KNEE SURGERY Bilateral    1985, 2001, 11/2002, 12/2006  . panhysterectomy  10/1983  . SHOULDER SURGERY Right    reverse total arthroplasty w/ biceps tenodesis  . TONSILLECTOMY AND ADENOIDECTOMY    . TOTAL HIP ARTHROPLASTY Right 04/2007  . WISDOM TOOTH EXTRACTION      There were no vitals filed for this visit.   Subjective Assessment - 07/05/20 1601    Subjective Patient reported that she is seeing the surgeon for her knee on Monday, would like to discuss this with her primary therapist.    Pertinent History Pt. from [place] and came to [place] to take care of friend and then Covid happened.  Pt. retired.  Pts. ex-husband lives in her house in [place].  R shoulder limitations (5# lifting restrictions).  Pt. went to [employer] [facility] for 10 years (every week) with benefits reported in low back.    Limitations Standing;Walking;Writing    Patient Stated Goals Decrease back pain/ improve strength/ mobility.    Currently in Pain? Yes    Pain Score 2     Pain Location Back    Pain Orientation Lower    Pain Descriptors / Indicators Sore    Pain Type Chronic pain    Pain Onset --   30 years            There.ex.:   No Nustep today secondary to elevated BP (PT will wait until pt. Talks with MD office)   Walking forward/ backwards/ lateral/high knees in //-bars 3x each with cuing to increase hip flexion/ step pattern/ upright posture with mirror feedback.  Light to no UE assist in  //-bars.  No ankle weights.  // bar walking lunges, x4 rounds bilaterallly  138/80 BP (taken after manual interventions)  Manual tx.:   Supine L/R LE generalized stretches (focus on hamstring/ piriformis/ trunk rotn.).    Seated STM to lumbar paraspinals (moderate tightness/ tenderness reported).         PT Long Term Goals - 06/07/20 1933      PT LONG TERM GOAL #1   Title Pt. will improve FOTO to predicted score to demonstrate improvement in pain free functional mobility.    Baseline 11/11: TBD    Time 4    Period Weeks    Status New    Target Date 07/05/20      PT LONG TERM GOAL #2   Title Pt. will improve R knee flexion AROM to at least 120 deg to improve stair climbing ability without compensations.    Baseline 11/11: R knee flex AROM: 105 deg    Time 4    Period Weeks    Status On-going    Target Date 07/05/20      PT LONG TERM GOAL #3   Title Pt. will improve R knee flexion strength to 5/5 without pain to improve pain free functional mobility.    Baseline 11/11: R knee flex: 4+/5 with pain    Time 4    Period Weeks    Status Not Met    Target Date 07/05/20      PT LONG TERM GOAL #4   Title Pt. will report 4/10 low back pain at worst with no radicular symptoms to improve ADLs/ household chores.    Baseline Pt. reports no pain in back currently at rest but >6/10 pain with increase activity.; 10/7: Pain is > 5/10 NPS with household chores.    Time 4    Period Weeks    Status Partially Met    Target Date 07/05/20                 Plan - 07/05/20 1603    Clinical Impression Statement Pt without complaints of increased back pain at end of session. Pt to see orthopedic doctor about knee pains/complaints for potential next steps in plan of care. Further PT visits deferred at this time until POC is established, pt agreeable.    Personal Factors and Comorbidities Education;Comorbidity 3+    Comorbidities AAA, AVN, HTN, severe LE edema bilat (L>R)     Examination-Activity Limitations Locomotion Level;Squat;Stairs;Stand    Examination-Participation Restrictions Yard Work;Driving;Community Activity    Stability/Clinical Decision Making Evolving/Moderate complexity    Rehab Potential Fair    PT Frequency  2x / week    PT Duration 4 weeks    PT Treatment/Interventions ADLs/Self Care Home Management;Electrical Stimulation;Moist Heat;Gait training;Stair training;Functional mobility training;Neuromuscular re-education;Balance training;Therapeutic exercise;Therapeutic activities;Patient/family education;Manual techniques;Passive range of motion    PT Next Visit Plan CHECK BP.   PT will recert goals on 5/46 tx. session.    Consulted and Agree with Plan of Care Patient           Patient will benefit from skilled therapeutic intervention in order to improve the following deficits and impairments:  Abnormal gait,Pain,Improper body mechanics,Postural dysfunction,Decreased mobility,Decreased activity tolerance,Decreased endurance,Decreased range of motion,Decreased strength,Impaired UE functional use,Impaired flexibility,Difficulty walking  Visit Diagnosis: Muscle weakness (generalized)  Right knee pain, unspecified chronicity  Decreased range of motion of right knee  Chronic bilateral low back pain without sciatica     Problem List Patient Active Problem List   Diagnosis Date Noted  . Nausea 03/31/2020  . Multiple thyroid nodules 12/28/2019  . Thyroid nodule 09/29/2019  . Lymphedema 11/10/2018  . PAD (peripheral artery disease) ([place]) 10/12/2018  . Aortic atherosclerosis ([place]) 10/12/2018  . Carotid stenosis, asymptomatic, bilateral 10/12/2018  . Positive colorectal cancer screening using Cologuard test 10/09/2018  . Gastroesophageal reflux disease   . Acute gastritis without hemorrhage   . Osteoarthritis 04/29/2018  . Hiatal hernia with GERD 04/29/2018  . Hyperlipidemia 04/29/2018  . Lymphedema of both lower extremities 04/29/2018  .  Left knee pain 10/23/2016  . Cataracts, bilateral 07/31/2016  . Glaucoma 07/31/2016  . Lateral epicondylitis of left elbow 10/20/2015  . Vitamin D deficiency 10/20/2015  . Type 2 diabetes, controlled, with neuropathy ([place]) 10/16/2015  . Gouty arthritis 06/03/2015  . H/O syncope 06/03/2015  . Mild intermittent asthma 06/03/2015  . Multiple gastric ulcers 06/03/2015  . Schatzki's ring 06/03/2015  . Undifferentiated connective tissue disease ([place]) 06/03/2015  . Bone disorder 04/05/2015  . Dental injury 04/05/2015  . Pericarditis 04/05/2015  . Sjogren's syndrome ([place]) 09/07/2014  . Elevated alkaline phosphatase level 08/24/2014  . Lactose intolerance 08/24/2014  . Obesity 08/24/2014  . Peripheral edema 08/24/2014  . 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 Abdominal adhesions 08/02/2014  . 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[place]) 08/02/2014  . Degenerative joint disease (DJD) of lumbar spine 08/02/2014  . Diverticulosis of both small and large intestine 08/02/2014  . Hyperuricemia 08/02/2014  . Pure hypercholesterolemia 08/02/2014  . Trigger finger 08/02/2014  . Right shoulder pain 07/29/2014  . Benign neoplasm of colon 06/30/2010  . Right upper quadrant pain 06/30/2010    Lieutenant Diego PT, DPT 9:16 AM,07/06/20   [place] [facility] [address] [place], [place], [address] Phone: [phone]   Fax:  [phone]  Name: [name] MRN: [id number] Date of Birth: 08-Jul-1945

## 2020-07-06 ENCOUNTER — Other Ambulatory Visit: Payer: Self-pay

## 2020-07-06 ENCOUNTER — Ambulatory Visit: Payer: Medicare Other | Admitting: Occupational Therapy

## 2020-07-06 ENCOUNTER — Other Ambulatory Visit
Admission: RE | Admit: 2020-07-06 | Discharge: 2020-07-06 | Disposition: A | Discharge status: Home / Self Care | Payer: Medicare Other | Attending: Cardiovascular Disease | Admitting: Cardiovascular Disease

## 2020-07-06 DIAGNOSIS — I700 Atherosclerosis of aorta: Secondary | ICD-10-CM | POA: Diagnosis present

## 2020-07-06 DIAGNOSIS — I739 Peripheral vascular disease, unspecified: Secondary | ICD-10-CM | POA: Diagnosis present

## 2020-07-06 DIAGNOSIS — I890 Lymphedema, not elsewhere classified: Secondary | ICD-10-CM | POA: Diagnosis not present

## 2020-07-06 DIAGNOSIS — E782 Mixed hyperlipidemia: Secondary | ICD-10-CM

## 2020-07-06 LAB — COMPREHENSIVE METABOLIC PANEL
ALT: 16 U/L (ref 0–44)
AST: 20 U/L (ref 15–41)
Albumin: 3.7 g/dL (ref 3.5–5.0)
Alkaline Phosphatase: 83 U/L (ref 38–126)
Anion gap: 12 (ref 5–15)
BUN: 15 mg/dL (ref 8–23)
CO2: 26 mmol/L (ref 22–32)
Calcium: 9 mg/dL (ref 8.9–10.3)
Chloride: 99 mmol/L (ref 98–111)
Creatinine, Ser: 0.87 mg/dL (ref 0.44–1.00)
GFR, Estimated: >60 mL/min (ref >60)
Glucose, Bld: 101 mg/dL — ABNORMAL HIGH (ref 70–99)
Potassium: 4.2 mmol/L (ref 3.5–5.1)
Sodium: 137 mmol/L (ref 135–145)
Total Bilirubin: 0.7 mg/dL (ref 0.3–1.2)
Total Protein: 7.4 g/dL (ref 6.5–8.1)

## 2020-07-06 LAB — LIPID PANEL
Cholesterol: 256 mg/dL — ABNORMAL HIGH (ref 0–200)
HDL: 46 mg/dL (ref >40)
LDL Cholesterol: 166 mg/dL — ABNORMAL HIGH (ref 0–99)
Total CHOL/HDL Ratio: 5.6 RATIO
Triglycerides: 220 mg/dL — ABNORMAL HIGH (ref <150)
VLDL: 44 mg/dL — ABNORMAL HIGH (ref 0–40)

## 2020-07-06 LAB — TSH: TSH: 0.841 u[IU]/mL (ref 0.350–4.500)

## 2020-07-06 NOTE — Therapy (Addendum)
[place] [facility] MAIN [facility] SERVICES [address] [place], [place], [address] Phone: [phone]   Fax:  [phone]  Occupational Therapy Treatment Note and Progress Report Reporting Period 07/06/20 - 10/04/20  Patient Details  Name: [name] MRN: [id number] Date of Birth: 09/28/1945 Referring Provider (OT): Julien Nordmann, MD   Encounter Date: 07/06/2020   OT End of Session - 07/06/20 1337    Visit Number 30   Number of Visits 36    Date for OT Re-Evaluation 08/07/20    OT Start Time 0145    OT Stop Time 0245    OT Time Calculation (min) 60 min    Activity Tolerance Patient tolerated treatment well    Behavior During Therapy [facility] for tasks assessed/performed           Past Medical History:  Diagnosis Date  . Anterolisthesis    Cervical spine  . Asthma   . Connective tissue disorder (HCC)    recurrent carotid arteritis, temporal arteritis, vasculitis mandible, general hepatitis, avascular necrosis bilat  . DDD (degenerative disc disease), lumbar   . Diabetes mellitus without complication (HCC)   . Diverticulosis   . Duodenitis   . Gouty arthritis   . Hiatal hernia with GERD   . History of cardiovascular stress test    a. 07/2018 [facility]): Fixed inferoapical defect w/ nl contraction-->attenuation artifact. No ischemia. EF 63%.  . Hyperlipidemia   . Hypertension    a. 02/2019 Renal artery duplex: no evidence of prox RAS.  Marland Kitchen Hyperuricemia   . Keratitis sicca, bilateral (HCC)   . Lymphedema of both lower extremities   . Osteoarthritis   . Pericarditis    Recurrent: Aug 97, July 05, Sept 08, July 16, July 18  . PUD (peptic ulcer disease)   . 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HCC)   . Sjogren's syndrome (HCC)   . Syncope   . Tendonitis, Achilles, right   . Tortuous colon   . Trigger finger     Past Surgical History:  Procedure Laterality Date  . ABDOMINAL HYSTERECTOMY    . BREAST BIOPSY    . BREAST EXCISIONAL BIOPSY Left 1986    neg  . CHOLECYSTECTOMY    . COLONOSCOPY WITH PROPOFOL N/A 08/26/2018   Procedure: COLONOSCOPY WITH PROPOFOL;  Surgeon: Midge Minium, MD;  Location: [facility] ENDOSCOPY;  Service: Endoscopy;  Laterality: N/A;  . CYSTOSCOPY  02/26/2018   Anola Gurney, MD   . distal arthrectomy    . ESOPHAGOGASTRODUODENOSCOPY (EGD) WITH PROPOFOL N/A 08/26/2018   Procedure: ESOPHAGOGASTRODUODENOSCOPY (EGD) WITH PROPOFOL;  Surgeon: Midge Minium, MD;  Location: [facility] ENDOSCOPY;  Service: Endoscopy;  Laterality: N/A;  . EXCISION NEUROMA    . KNEE SURGERY Bilateral    1985, 2001, 11/2002, 12/2006  . panhysterectomy  10/1983  . SHOULDER SURGERY Right    reverse total arthroplasty w/ biceps tenodesis  . TONSILLECTOMY AND ADENOIDECTOMY    . TOTAL HIP ARTHROPLASTY Right 04/2007  . WISDOM TOOTH EXTRACTION      There were no vitals filed for this visit.   Subjective Assessment - 07/06/20 1337    Subjective  [name] presents for OT Rx visit  30 of 36 to address BLE lymphedema. Pt's presents wearing initial custom compression knee high on LLE, and RLE compression wraps. Pt  denies LE related pain. Pt reports she saw referring physician earlier in the week and he was pleased with her progress with LE reduction thius far. Pt is still awaiting delivery  Pertinent History PMHx extensive; Dx contributing to leg swelling include OA, B knee and R hip arthroplasties, asthma, HTN. Suspect LE venous insufficiency.    Limitations chronic pain, chronic leg swelling, generalized weakness, kyphotic posture    Repetition Increases Symptoms    Special Tests + stemmer sign bilaterally base of toes    Patient Stated Goals Reduce LE edema    Pain Onset --   30 years                       OT Treatments/Exercises (OP) - 07/06/20 0001      ADLs   ADL Education Given Yes      Manual Therapy   Manual Therapy Edema management    Edema Management skin care throughout MLD to increase skin hydration and to decrease  infection risk.    Manual Lymphatic Drainage (MLD) MLD to RLE using    Compression Bandaging RLE compression wraps. LLE garment not doffed.                  OT Education - 07/06/20 1610    Education Details Continued skilled Pt/caregiver education  And LE ADL training throughout visit for lymphedema self care/ home program, including compression wrapping, compression garment and device wear/care, lymphatic pumping ther ex, simple self-MLD, and skin care. Discussed progress towards goals.    Person(s) Educated Patient    Methods Explanation;Demonstration;Handout    Comprehension Verbalized understanding;Returned demonstration               OT Long Term Goals - 06/01/20 1441      OT LONG TERM GOAL #1   Title With modified independent (extra time) Pt will be able to apply knee length, multi-layer, short stretch compression wraps daily from ankle to tibial tuberosity on one leg at a time using correct gradient techniques to return affected limb/s, as closely as possible, to premorbid size and shape, to limit leg pain and infection risk, and to improve safe functional mobility and ADLs performance.    Baseline Dependent    Time 4    Period Days    Status Achieved   achieved 4th visit     OT LONG TERM GOAL #2   Title Pt will be able to verbalize signs and symptoms of cellulitis infection and identify lymphedema precautions using printed resource (modified independence) for reference to decrease infection risk and limit LE progression over time.    Baseline Max A    Time 4    Period Days    Status Achieved      OT LONG TERM GOAL #3   Title Pt will sustain a least 85% compliance with all daily LE self-care home program components throughout Intensive Phase CDT, including impeccable skin care, lymphatic pumping ther ex,  compression wraps and simple self MLD, to ensure optimal limb volume reduction, to limit infection risk and to limit LE progression.    Baseline dependent     Time 12    Period Weeks    Status Achieved   "I'm 98% !"     OT LONG TERM GOAL #4   Title Using assistive devices (modified independence)  Pt will be able to don and doff appropriate daytime compression garments to limit edema re-accumulation and further progression by issue date.    Baseline Max A    Time 12    Period Weeks    Status Achieved      OT LONG TERM GOAL #5  Title After skilled teaching Pt  will be able to perform all lymphedema self-care home program components with modified independence (extra time, assistive devices PRN) , including simple self MLD, lymphatic pumping exercises, don, doff and wear compression garments  daily, sustain   impeccable skin care daily, to limit lymphedema progression and infection risk.    Baseline Max A    Time 12    Period Weeks    Status Achieved   Pt is trained for comrpession wrapping skin care and ther ex. She is still working on learning simple self MLD as of 03/21/20     OT LONG TERM GOAL #6   Title Pt will increase score on FOTO 10 points, from 45 to 55/100, to improve functional performance of basic ADLs.    Baseline Max A    Time 12    Period Weeks    Status On-going                 Plan - 07/06/20 1611    Clinical Impression Statement Compliance with compression improved since last visit as Pt arrives with wraps in place today. Limb volume and tissue density of R leg is visibly and palpably decreased. Pt tolerated MLD including fibrosis techniques, without increased pain    OT Occupational Profile and History Comprehensive Assessment- Review of records and extensive additional review of physical, cognitive, psychosocial history related to current functional performance    Occupational performance deficits (Please refer to evaluation for details): ADL's;IADL's;Social Participation;Work;Leisure;Other    Body Structure / Function / Physical Skills ADL;Decreased knowledge of precautions;ROM;Balance;Decreased knowledge of use of  DME;Mobility;Edema;Skin integrity;Pain;IADL    Clinical Decision Making Several treatment options, min-mod task modification necessary    Comorbidities Affecting Occupational Performance: Presence of comorbidities impacting occupational performance    Modification or Assistance to Complete Evaluation  Min-Moderate modification of tasks or assist with assess necessary to complete eval    OT Frequency 2x / week    OT Duration 12 weeks    OT Treatment/Interventions Self-care/ADL training;Therapeutic exercise;Manual lymph drainage;Compression bandaging;Patient/family education;Other (comment);Therapeutic activities;DME and/or AE instruction;Manual Therapy    Plan Intensive Phase CDT: 1 leg at a time to limit fall risk. MLD, skin care, ther ex , cimpression wraps -> daytime garments, HOS device, consider Flexitouch if appropriate    Recommended Other Services Fit w/ appropriate compression garments that are effective, comfortable and  Pt can don using AE and extra time    Consulted and Agree with Plan of Care Patient           Patient will benefit from skilled therapeutic intervention in order to improve the following deficits and impairments:   Body Structure / Function / Physical Skills: ADL,Decreased knowledge of precautions,ROM,Balance,Decreased knowledge of use of DME,Mobility,Edema,Skin integrity,Pain,IADL       Visit Diagnosis: Lymphedema, not elsewhere classified    Problem List Patient Active Problem List   Diagnosis Date Noted  . Nausea 03/31/2020  . Multiple thyroid nodules 12/28/2019  . Thyroid nodule 09/29/2019  . Lymphedema 11/10/2018  . PAD (peripheral artery disease) ([place]) 10/12/2018  . Aortic atherosclerosis ([place]) 10/12/2018  . Carotid stenosis, asymptomatic, bilateral 10/12/2018  . Positive colorectal cancer screening using Cologuard test 10/09/2018  . Gastroesophageal reflux disease   . Acute gastritis without hemorrhage   . Osteoarthritis 04/29/2018  . Hiatal  hernia with GERD 04/29/2018  . Hyperlipidemia 04/29/2018  . Lymphedema of both lower extremities 04/29/2018  . Left knee pain 10/23/2016  . Cataracts, bilateral 07/31/2016  . Glaucoma  07/31/2016  . Lateral epicondylitis of left elbow 10/20/2015  . Vitamin D deficiency 10/20/2015  . Type 2 diabetes, controlled, with neuropathy ([place]) 10/16/2015  . Gouty arthritis 06/03/2015  . H/O syncope 06/03/2015  . Mild intermittent asthma 06/03/2015  . Multiple gastric ulcers 06/03/2015  . Schatzki's ring 06/03/2015  . Undifferentiated connective tissue disease ([place]) 06/03/2015  . Bone disorder 04/05/2015  . Dental injury 04/05/2015  . Pericarditis 04/05/2015  . Sjogren's syndrome ([place]) 09/07/2014  . Elevated alkaline phosphatase level 08/24/2014  . Lactose intolerance 08/24/2014  . Obesity 08/24/2014  . Peripheral edema 08/24/2014  . 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 Abdominal adhesions 08/02/2014  . 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[place]) 08/02/2014  . Degenerative joint disease (DJD) of lumbar spine 08/02/2014  . Diverticulosis of both small and large intestine 08/02/2014  . Hyperuricemia 08/02/2014  . Pure hypercholesterolemia 08/02/2014  . Trigger finger 08/02/2014  . Right shoulder pain 07/29/2014  . Benign neoplasm of colon 06/30/2010  . Right upper quadrant pain 06/30/2010    Andrey Spearman, MS, OTR/L, [facility] 07/06/20 4:18 PM  [place] MAIN [facility] SERVICES [address] [place], [place], [address] Phone: [phone]   Fax:  [phone]  Name: [name] MRN: [id number] Date of Birth: 1946/03/09

## 2020-07-07 ENCOUNTER — Other Ambulatory Visit: Payer: Self-pay | Admitting: Family

## 2020-07-07 NOTE — Telephone Encounter (Signed)
Does this patient supposed to be taking Losartan?

## 2020-07-07 NOTE — Telephone Encounter (Signed)
From Dr. Donivan Scull office note last week she is supposed to be taking Losartan. Unclear whether she is taking Losartan 50mg  QD or 50mg  BID. Please clarify with patient and send in appropriate refill. Would refill at present dose she has been taking.   Thanks! Loel Dubonnet, NP

## 2020-07-07 NOTE — Telephone Encounter (Signed)
Reach out to pt regarding her Losartan refill to see what dosing she takes to have reorder. Pt reports she takes the Losartan 50 mg daily tabs, however she takes up to 75 mg as needed as well, some times "half a pill there or another half there, Dr. Rockey Situ has this weird thing with me taking my pills and makes sure I always have extra pills on hand since money can be tight sometimes". Will refill the 50mg  daily pills with option to up to 75mg  as needed as she discuss with Dr. Rockey Situ.

## 2020-07-11 ENCOUNTER — Telehealth: Payer: Self-pay | Admitting: *Deleted

## 2020-07-11 NOTE — Telephone Encounter (Signed)
No answer. Left message to call back.   

## 2020-07-11 NOTE — Telephone Encounter (Signed)
-----   Message from Minna Merritts, MD sent at 07/10/2020  1:53 PM EST ----- Total chol jump, Appears off the crestor/zetia? Numbers up 100 points Would get back on if no side effects

## 2020-07-11 NOTE — Addendum Note (Signed)
Addended by: Ansel Bong on: 07/11/2020 04:56 PM   Modules accepted: Orders

## 2020-07-12 DIAGNOSIS — Z96652 Presence of left artificial knee joint: Secondary | ICD-10-CM | POA: Insufficient documentation

## 2020-07-12 DIAGNOSIS — Z96641 Presence of right artificial hip joint: Secondary | ICD-10-CM | POA: Insufficient documentation

## 2020-07-12 NOTE — Telephone Encounter (Signed)
Was able to get in touch with pt, advised of her lipid results, pt states has been taking her Zetia, but had not been taking the Crestor within the past month. Pt reports will restart her Crestor to lower her cholesterol numbers. Otherwise all questions or concerns were address and no additional concerns at this time. Agreeable to plan, will call back for anything further.

## 2020-07-12 NOTE — Telephone Encounter (Signed)
Patient returning call and will be home this afternoon .

## 2020-07-13 ENCOUNTER — Ambulatory Visit: Payer: Medicare Other | Attending: Cardiovascular Disease | Admitting: Occupational Therapy

## 2020-07-13 ENCOUNTER — Telehealth: Payer: Self-pay | Admitting: Cardiovascular Disease

## 2020-07-13 ENCOUNTER — Other Ambulatory Visit: Payer: Self-pay

## 2020-07-13 DIAGNOSIS — I890 Lymphedema, not elsewhere classified: Secondary | ICD-10-CM | POA: Insufficient documentation

## 2020-07-13 NOTE — Telephone Encounter (Signed)
Patient declined recall and states she will fu PRN  Deleting recall.

## 2020-07-13 NOTE — Therapy (Signed)
[place] MAIN [facility] SERVICES [address] [place], [place], [address] Phone: [phone]   Fax:  [phone]  Occupational Therapy Treatment  Patient Details  Name: [name] MRN: [id number] Date of Birth: 1945-11-21 Referring Provider (OT): Ida Rogue, MD   Encounter Date: 07/13/2020   OT End of Session - 07/13/20 4010    Visit Number 34    Number of Visits 36    Date for OT Re-Evaluation 08/07/20    OT Start Time 0215    OT Stop Time 0300    OT Time Calculation (min) 45 min           Past Medical History:  Diagnosis Date  . Anterolisthesis    Cervical spine  . Asthma   . Connective tissue disorder (HCC)    recurrent carotid arteritis, temporal arteritis, vasculitis mandible, general hepatitis, avascular necrosis bilat  . DDD (degenerative disc disease), lumbar   . Diabetes mellitus without complication ([place])   . Diverticulosis   . Duodenitis   . Gouty arthritis   . Hiatal hernia with GERD   . History of cardiovascular stress test    a. 07/2018 [facility]): Fixed inferoapical defect w/ nl contraction-->attenuation artifact. No ischemia. EF 63%.  . Hyperlipidemia   . Hypertension    a. 02/2019 Renal artery duplex: no evidence of prox RAS.  Marland Kitchen Hyperuricemia   . Keratitis sicca, bilateral ([place])   . Lymphedema of both lower extremities   . Osteoarthritis   . Pericarditis    Recurrent: Aug 97, July 05, Sept 08, July 16, July 18  . PUD (peptic ulcer disease)   . 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[place])   . Sjogren's syndrome ([place])   . Syncope   . Tendonitis, Achilles, right   . Tortuous colon   . Trigger finger     Past Surgical History:  Procedure Laterality Date  . ABDOMINAL HYSTERECTOMY    . BREAST BIOPSY    . BREAST EXCISIONAL BIOPSY Left 1986   neg  . CHOLECYSTECTOMY    . COLONOSCOPY WITH PROPOFOL N/A 08/26/2018   Procedure: COLONOSCOPY WITH PROPOFOL;  Surgeon: Lucilla Lame, MD;  Location: [facility] ENDOSCOPY;  Service:  Endoscopy;  Laterality: N/A;  . CYSTOSCOPY  02/26/2018   [name], MD   . distal arthrectomy    . ESOPHAGOGASTRODUODENOSCOPY (EGD) WITH PROPOFOL N/A 08/26/2018   Procedure: ESOPHAGOGASTRODUODENOSCOPY (EGD) WITH PROPOFOL;  Surgeon: Lucilla Lame, MD;  Location: [facility] ENDOSCOPY;  Service: Endoscopy;  Laterality: N/A;  . EXCISION NEUROMA    . KNEE SURGERY Bilateral    1985, 2001, 11/2002, 12/2006  . panhysterectomy  10/1983  . SHOULDER SURGERY Right    reverse total arthroplasty w/ biceps tenodesis  . TONSILLECTOMY AND ADENOIDECTOMY    . TOTAL HIP ARTHROPLASTY Right 04/2007  . WISDOM TOOTH EXTRACTION      There were no vitals filed for this visit.   Subjective Assessment - 07/13/20 1645    Subjective  [name] presents for OT Rx visit  85 of 2 to address BLE lymphedema. Pt's presents wearing initial custom compression knee high on LLE, and RLE compression wraps. Pt  denies LE related pain. Pt agrees with plan to complete initial fitting for RLE custom compression garment today.    Pertinent History PMHx extensive; Dx contributing to leg swelling include OA, B knee and R hip arthroplasties, asthma, HTN. Suspect LE venous insufficiency.    Limitations chronic pain, chronic leg swelling, generalized weakness, kyphotic posture  Repetition Increases Symptoms    Special Tests + stemmer sign bilaterally base of toes    Patient Stated Goals Reduce LE edema    Currently in Pain? Yes   leg pain resolved. Knee pain persists. unrated   Pain Onset --   30 years                       OT Treatments/Exercises (OP) - 07/13/20 1648      ADLs   ADL Education Given Yes      Manual Therapy   Manual Therapy Edema management    Edema Management initial RLE custom knee high compression garment fitting                  OT Education - 07/13/20 1649    Education Details Skilled LE self care training for wear and care regimes for custom elastic compression garments fitted  today.Discussed replacement schedule. Pt able to don and doff without assistive device at present.    Person(s) Educated Patient    Methods Explanation;Demonstration;Handout    Comprehension Verbalized understanding;Returned demonstration               OT Long Term Goals - 07/13/20 1652      OT LONG TERM GOAL #1   Title With modified independent (extra time) Pt will be able to apply knee length, multi-layer, short stretch compression wraps daily from ankle to tibial tuberosity on one leg at a time using correct gradient techniques to return affected limb/s, as closely as possible, to premorbid size and shape, to limit leg pain and infection risk, and to improve safe functional mobility and ADLs performance.    Baseline Dependent    Time 4    Period Days    Status Achieved   Independent 07/13/20     OT LONG TERM GOAL #2   Title Pt will be able to verbalize signs and symptoms of cellulitis infection and identify lymphedema precautions using printed resource (modified independence) for reference to decrease infection risk and limit LE progression over time.    Baseline Max A    Time 4    Period Days    Status Achieved      OT LONG TERM GOAL #3   Title Pt will sustain a least 85% compliance with all daily LE self-care home program components throughout Intensive Phase CDT, including impeccable skin care, lymphatic pumping ther ex,  compression wraps and simple self MLD, to ensure optimal limb volume reduction, to limit infection risk and to limit LE progression.    Baseline dependent    Time 12    Period Weeks    Status Achieved   "I'm 98% !"     OT LONG TERM GOAL #4   Title Using assistive devices (modified independence)  Pt will be able to don and doff appropriate daytime compression garments to limit edema re-accumulation and further progression by issue date.    Baseline Max A    Time 12    Period Weeks    Status Achieved      OT LONG TERM GOAL #5   Title After skilled  teaching Pt  will be able to perform all lymphedema self-care home program components with modified independence (extra time, assistive devices PRN) , including simple self MLD, lymphatic pumping exercises, don, doff and wear compression garments  daily, sustain   impeccable skin care daily, to limit lymphedema progression and infection risk.    Baseline Max A  Time 12    Period Weeks    Status Achieved   Pt is trained for comrpession wrapping skin care and ther ex. She is still working on learning simple self MLD as of 03/21/20     OT LONG TERM GOAL #6   Title Pt will increase score on FOTO 10 points, from 45 to 55/100, to improve functional performance of basic ADLs.    Baseline Max A    Time 12    Period Weeks    Status On-going                 Plan - 07/13/20 1650    Clinical Impression Statement Completed intital fitting for RLE custom Jobst Elvarex knee length Elvarex CLASSIC, ccl 2 compression stocking. Excellent fit at all landmarks. No remake needed. Pt is able to don garment independently without assistive device due to excellent hip AROM and grip strength! Pt transitions today to self management stage of CDT. She agrees with plan to return in 8 weeks for follow up. She will call PRN.    OT Occupational Profile and History Comprehensive Assessment- Review of records and extensive additional review of physical, cognitive, psychosocial history related to current functional performance    Occupational performance deficits (Please refer to evaluation for details): ADL's;IADL's;Social Participation;Work;Leisure;Other    Body Structure / Function / Physical Skills ADL;Decreased knowledge of precautions;ROM;Balance;Decreased knowledge of use of DME;Mobility;Edema;Skin integrity;Pain;IADL    Clinical Decision Making Several treatment options, min-mod task modification necessary    Comorbidities Affecting Occupational Performance: Presence of comorbidities impacting occupational  performance    Modification or Assistance to Complete Evaluation  Min-Moderate modification of tasks or assist with assess necessary to complete eval    OT Frequency 2x / week    OT Duration 12 weeks    OT Treatment/Interventions Self-care/ADL training;Therapeutic exercise;Manual lymph drainage;Compression bandaging;Patient/family education;Other (comment);Therapeutic activities;DME and/or AE instruction;Manual Therapy    Plan Intensive Phase CDT: 1 leg at a time to limit fall risk. MLD, skin care, ther ex , cimpression wraps -> daytime garments, HOS device, consider Flexitouch if appropriate    Recommended Other Services Fit w/ appropriate compression garments that are effective, comfortable and  Pt can don using AE and extra time    Consulted and Agree with Plan of Care Patient           Patient will benefit from skilled therapeutic intervention in order to improve the following deficits and impairments:   Body Structure / Function / Physical Skills: ADL,Decreased knowledge of precautions,ROM,Balance,Decreased knowledge of use of DME,Mobility,Edema,Skin integrity,Pain,IADL       Visit Diagnosis: Lymphedema, not elsewhere classified    Problem List Patient Active Problem List   Diagnosis Date Noted  . Nausea 03/31/2020  . Multiple thyroid nodules 12/28/2019  . Thyroid nodule 09/29/2019  . Lymphedema 11/10/2018  . PAD (peripheral artery disease) ([place]) 10/12/2018  . Aortic atherosclerosis ([place]) 10/12/2018  . Carotid stenosis, asymptomatic, bilateral 10/12/2018  . Positive colorectal cancer screening using Cologuard test 10/09/2018  . Gastroesophageal reflux disease   . Acute gastritis without hemorrhage   . Osteoarthritis 04/29/2018  . Hiatal hernia with GERD 04/29/2018  . Hyperlipidemia 04/29/2018  . Lymphedema of both lower extremities 04/29/2018  . Left knee pain 10/23/2016  . Cataracts, bilateral 07/31/2016  . Glaucoma 07/31/2016  . Lateral epicondylitis of left elbow  10/20/2015  . Vitamin D deficiency 10/20/2015  . Type 2 diabetes, controlled, with neuropathy ([place]) 10/16/2015  . Gouty arthritis 06/03/2015  . H/O syncope  06/03/2015  . Mild intermittent asthma 06/03/2015  . Multiple gastric ulcers 06/03/2015  . Schatzki's ring 06/03/2015  . Undifferentiated connective tissue disease ([place]) 06/03/2015  . Bone disorder 04/05/2015  . Dental injury 04/05/2015  . Pericarditis 04/05/2015  . Sjogren's syndrome ([place]) 09/07/2014  . Elevated alkaline phosphatase level 08/24/2014  . Lactose intolerance 08/24/2014  . Obesity 08/24/2014  . Peripheral edema 08/24/2014  . 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 Abdominal adhesions 08/02/2014  . 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[place]) 08/02/2014  . Degenerative joint disease (DJD) of lumbar spine 08/02/2014  . Diverticulosis of both small and large intestine 08/02/2014  . Hyperuricemia 08/02/2014  . Pure hypercholesterolemia 08/02/2014  . Trigger finger 08/02/2014  . Right shoulder pain 07/29/2014  . Benign neoplasm of colon 06/30/2010  . Right upper quadrant pain 06/30/2010    Andrey Spearman, MS, OTR/L, [facility] 07/13/20 4:55 PM   [place] MAIN [facility] SERVICES [address] [place], [place], [address] Phone: [phone]   Fax:  [phone]  Name: [name] MRN: [id number] Date of Birth: [birth year]

## 2020-07-18 ENCOUNTER — Other Ambulatory Visit: Payer: Self-pay | Admitting: Cardiovascular Disease

## 2020-07-18 ENCOUNTER — Other Ambulatory Visit: Payer: Self-pay

## 2020-07-18 ENCOUNTER — Telehealth: Payer: Self-pay

## 2020-07-18 ENCOUNTER — Ambulatory Visit (INDEPENDENT_AMBULATORY_CARE_PROVIDER_SITE_OTHER): Payer: Medicare Other

## 2020-07-18 DIAGNOSIS — I739 Peripheral vascular disease, unspecified: Secondary | ICD-10-CM

## 2020-07-18 DIAGNOSIS — I6523 Occlusion and stenosis of bilateral carotid arteries: Secondary | ICD-10-CM

## 2020-07-18 DIAGNOSIS — I319 Disease of pericardium, unspecified: Secondary | ICD-10-CM

## 2020-07-18 DIAGNOSIS — I700 Atherosclerosis of aorta: Secondary | ICD-10-CM | POA: Diagnosis not present

## 2020-07-18 DIAGNOSIS — E782 Mixed hyperlipidemia: Secondary | ICD-10-CM | POA: Diagnosis not present

## 2020-07-18 DIAGNOSIS — M109 Gout, unspecified: Secondary | ICD-10-CM

## 2020-07-18 NOTE — Telephone Encounter (Signed)
Occupational Therapy Certification faxed to  [facility]  [place], [place], [address]  Fax: [phone]   Dr. Rockey Situ signed for approval for OT modifications.

## 2020-07-20 ENCOUNTER — Ambulatory Visit: Payer: Medicare Other | Admitting: Occupational Therapy

## 2020-07-20 NOTE — Addendum Note (Signed)
Addended by: Dorcas Carrow C on: 07/20/2020 10:20 AM   Modules accepted: Orders

## 2020-07-21 ENCOUNTER — Telehealth: Payer: Self-pay | Admitting: Cardiovascular Disease

## 2020-07-21 NOTE — Telephone Encounter (Signed)
Pt c/o BP issue: STAT if pt c/o blurred vision, one-sided weakness or slurred speech  1. What are your last 5 BP readings? 180's   2. Are you having any other symptoms (ex. Dizziness, headache, blurred vision, passed out)? no  3. What is your BP issue? Patient would like to discuss increasing meds to help control bp

## 2020-07-21 NOTE — Telephone Encounter (Signed)
Was able to reach out to pt regarding her BP, reports over the past 2 weeks, her BP has been fluctuating, this am it was systolic 023X, before her BP medications. Pt reports she takes her BP regularly through out the day and it "bounces around". No s/s associated with elevated BP, Current BP upper 435W systolic. Went over pt's medication list, pt reports has been taking an extra 0.1 mg of clonidine and only taking half a tab (25 mg) of losartan BID. Pt taking differently the what is prescribe, pt reports "Dr. Rockey Situ tells me to play around with my medications until something works cus i'm a complicated case with my history". Advised pt based on Dr. Donivan Scull recent visit noted on 1/24, he recommended "she restart clonidine 0.1 mg at least twice daily and Stay on losartan 50 mg twice daily, continue bystolic 10 mg twice daily". Pt reports she will start taking the losartan 50 mg BID instead of the half pill to see if that improves her BP. Does report she has been taking the bystolic as order. Otherwise all questions or concerns were address and no additional concerns at this time. Agreeable to plan, will call back for anything further and if her BP does not improve or she develops cardiac symptoms.

## 2020-07-25 ENCOUNTER — Telehealth: Payer: Self-pay

## 2020-07-25 ENCOUNTER — Ambulatory Visit: Payer: Medicare Other | Admitting: Cardiovascular Disease

## 2020-07-25 NOTE — Telephone Encounter (Signed)
Able to reach pt regarding her recent Carotid u/s, Dr. Rockey Situ had a chance to review her results and advised  "Carotid u/s  Right Carotid: 40-59% stenosis.  Mild on left" Pt happy for call of results, questions answer, will f/u as needed per pt, otherwise no additional concerns at this time. will call back for anything further.

## 2020-07-27 ENCOUNTER — Ambulatory Visit: Payer: Medicare Other | Admitting: Occupational Therapy

## 2020-08-04 ENCOUNTER — Telehealth: Payer: Self-pay | Admitting: *Deleted

## 2020-08-04 NOTE — Telephone Encounter (Signed)
Pt has been approved for Bystolic 10 mg tablet patient assistance. Pt approved through [id number]/44/6950 Bystolic medication supply should be received with 7-10 business days.  Pt or provider can request refills, [phone] approximately 2 wks before medication is needed.

## 2020-08-08 ENCOUNTER — Ambulatory Visit (INDEPENDENT_AMBULATORY_CARE_PROVIDER_SITE_OTHER): Payer: Medicare Other | Admitting: Pharmacist

## 2020-08-08 DIAGNOSIS — N183 Chronic kidney disease, stage 3 unspecified: Secondary | ICD-10-CM | POA: Diagnosis not present

## 2020-08-08 DIAGNOSIS — I129 Hypertensive chronic kidney disease with stage 1 through stage 4 chronic kidney disease, or unspecified chronic kidney disease: Secondary | ICD-10-CM

## 2020-08-08 DIAGNOSIS — E1140 Type 2 diabetes mellitus with diabetic neuropathy, unspecified: Secondary | ICD-10-CM

## 2020-08-08 NOTE — Chronic Care Management (AMB) (Signed)
Chronic Care Management Pharmacy Note  08/08/2020 Name:  [name] MRN:  [id number] DOB:  February 16, 1946  Subjective: [name] is an 75 y.o. year old female who is a primary patient of Malfi, [name], FNP.  The CCM team was consulted for assistance with disease management and care coordination needs.    Engaged with patient by telephone for follow up visit in response to provider referral for pharmacy case management and/or care coordination services.   Consent to Services:  The patient was given information about Chronic Care Management services, agreed to services, and gave verbal consent prior to initiation of services.  Please see initial visit note for detailed documentation.   Objective:  Lab Results  Component Value Date   CREATININE 0.87 07/06/2020   CREATININE 1.05 (H) 12/07/2019   CREATININE 0.99 (H) 02/10/2019    Lab Results  Component Value Date   HGBA1C 8.5 (A) 03/29/2020       Component Value Date/Time   CHOL 256 (H) 07/06/2020 1207   TRIG 220 (H) 07/06/2020 1207   HDL 46 07/06/2020 1207   CHOLHDL 5.6 07/06/2020 1207   VLDL 44 (H) 07/06/2020 1207   LDLCALC 166 (H) 07/06/2020 1207   LDLCALC 100 (H) 02/10/2019 1053   LDLDIRECT 77.1 12/07/2019 1652    BP Readings from Last 3 Encounters:  07/04/20 138/80  04/28/20 135/61  04/26/20 (!) 138/58    Assessment: Review of patient past medical history, allergies, medications, health status, including review of consultants reports, laboratory and other test data, was performed as part of comprehensive evaluation and provision of chronic care management services.   SDOH:  (Social Determinants of Health) assessments and interventions performed: none   CCM Care Plan  Allergies  Allergen Reactions  . Amlodipine     Other reaction(s): Unknown  . Aspirin     Other reaction(s): Unknown  . Bee Venom   . Ciprofloxacin     Other reaction(s): Other (see comments), Unknown  . Codeine     Other  reaction(s): Unknown Must have pre medications before taking per patient report Includes all derivatives   . Erythromycin     Other reaction(s): Unknown, Unknown  . Glimepiride     Other reaction(s): Unknown  . Hydralazine     Other reaction(s): Unknown  . Hydrochlorothiazide     Other reaction(s): Dizziness or lightheadedness.  . Hydrocodone     Other reaction(s): Unknown Must have pre medications before taking per patient report  . Hydrocodone-Acetaminophen Nausea And Vomiting    MUST BE PREMEDICATED  . Hydromorphone Nausea And Vomiting    Other reaction(s): Unknown Must have pre medications before taking per patient report MUST BE PREMEDICATED   . Irbesartan     Other reaction(s): Unknown  . Irbesartan-Hydrochlorothiazide     Other reaction(s): Unknown  . Lisinopril     Other reaction(s): Unknown  . Maxitrol [Neomycin-Polymyxin-Dexameth]   . Metformin And Related   . Metformin Hcl     Other reaction(s): Unknown  . Methylcellulose     Other reaction(s): Unknown  . Metoclopramide Nausea And Vomiting    Other reaction(s): Unknown  . Metoprolol     Other reaction(s): Unknown  . Neomycin-Bacitracin Zn-Polymyx     Other reaction(s): Unknown  . Norvasc [Amlodipine Besylate]   . Nsaids Other (See Comments)    Other reaction(s): Unknown bleeding   . Omeprazole Magnesium     Other reaction(s): Unknown  . Other     Pt is allergic to  staples and surgical metals  . Oxycodone-Acetaminophen     Other reaction(s): Unknown, Unknown Must have pre medications before taking per patient report   . Penicillins     Other reaction(s): Unknown, Unknown  . Pioglitazone     Other reaction(s): Unknown  . Poison [employer]     Poison [place] also  . Prilosec [Omeprazole]   . Silver Sulfadiazine     Other reaction(s): Unknown  . Sulfa Antibiotics     Other reaction(s): Unknown, Unknown  . Tape     Other reaction(s): Unknown  . Tetanus Toxoid Other (See Comments)  .  Tramadol     Other reaction(s): Unknown Must have pre medications before taking per patient report  . Betadine [Povidone Iodine] Rash    Only topically when left on skin.    Medications Reviewed Today    Reviewed by Lieutenant Diego, PT (Physical Therapist) on 07/05/20 at 1600  Med List Status: <None>  Medication Order Taking? Sig Documenting Provider Last Dose Status Informant  acetaminophen (TYLENOL) 500 MG tablet [id number] No Take 500 mg by mouth daily as needed. [provider] Taking Active   ARTIFICIAL TEAR OP [id number] No Apply to eye as needed.  [provider] Taking Active   BD INSULIN SYRINGE U/F 31G X 5/16" 1 ML MISC [id number] No USE AS DIRECTED. WITH HUMALOG Mikey College, NP Taking Active   BIOTIN PO [id number] No Take 1 mg by mouth daily.  [provider] Taking Active   Cholecalciferol (VITAMIN D3) 25 MCG (1000 UT) CAPS [id number] No Take 4 capsules by mouth daily.  [provider] Taking Active   clindamycin (CLEOCIN) 300 MG capsule [id number] No Take 600 mg by mouth. 1 Hour before dental procedures [provider] Taking Active   cloNIDine (CATAPRES) 0.1 MG tablet [id number] No Take 1 tablet (0.1 mg) by mouth three times daily Gollan, Kathlene November, MD Taking Active   COLCRYS 0.6 MG tablet [id number] No Take 1 tablet (0.6 mg total) by mouth 2 (two) times daily as needed. For up to 3 days for pericarditis, or up to 7 days for gout or connective tissue disorder. Olin Hauser, DO Taking Active   cyclobenzaprine (FLEXERIL) 5 MG tablet [id number] No Take 1 tablet (5 mg total) by mouth daily as needed for muscle spasms. Malfi, [name], FNP Taking Active   ezetimibe (ZETIA) 10 MG tablet [id number] No Take 1 tablet (10 mg total) by mouth daily. Minna Merritts, MD Taking Active   famotidine (PEPCID) 20 MG tablet [id number] No Take 20 mg by mouth 2 (two) times daily. [provider] Taking Active   FREESTYLE LITE test strip  [id number] No USE AS DIRECTED THREE TIMES DAILY FOR DIABETES Malfi, [name], FNP Taking Active   insulin degludec (TRESIBA FLEXTOUCH) 200 UNIT/ML FlexTouch Pen [id number] No Inject 56 Units into the skin daily. Malfi, [name], FNP Taking Active   insulin lispro (HUMALOG) 100 UNIT/ML injection [id number] No Correction scale: take 1 unit per 50 over 150 before meals up to three times daily.  Up to 20 units per day. Mikey College, NP Taking Active   Insulin Pen Needle (PEN NEEDLES) 32G X 5 MM MISC [id number] No 1 Device by Does not apply route daily. Verl Bangs, FNP Taking Active   lactase (LACTAID) 3000 units tablet [id number] No Take by mouth as needed.  [provider] Taking Active   Lancets (FREESTYLE) lancets [id number] No Must test x4/day [provider] Taking Active   losartan (COZAAR) 50 MG tablet [id number] No Take 50 mg by mouth daily. [provider] Taking Active   Nebivolol HCl 20 MG TABS [id number] No Take 10 mg by mouth in the morning and at bedtime. [provider] Taking Active   ondansetron (ZOFRAN-ODT) 4 MG disintegrating tablet [id number] No Take 1 tablet (4 mg total) by mouth every 8 (eight) hours as needed for nausea or vomiting. Verl Bangs, FNP Taking Active   rosuvastatin (CRESTOR) 10 MG tablet [id number] No TAKE 1 TABLET BY MOUTH EVERY DAY Malfi, [name], FNP Taking Active           Patient Active Problem List   Diagnosis Date Noted  . Nausea 03/31/2020  . Multiple thyroid nodules 12/28/2019  . Thyroid nodule 09/29/2019  . Lymphedema 11/10/2018  . PAD (peripheral artery disease) ([place]) 10/12/2018  . Aortic atherosclerosis ([place]) 10/12/2018  . Carotid stenosis, asymptomatic, bilateral 10/12/2018  . Positive colorectal cancer screening using Cologuard test 10/09/2018  . Gastroesophageal reflux disease   . Acute gastritis without hemorrhage   . Osteoarthritis 04/29/2018  . Hiatal hernia with GERD 04/29/2018  .  Hyperlipidemia 04/29/2018  . Lymphedema of both lower extremities 04/29/2018  . Left knee pain 10/23/2016  . Cataracts, bilateral 07/31/2016  . Glaucoma 07/31/2016  . Lateral epicondylitis of left elbow 10/20/2015  . Vitamin D deficiency 10/20/2015  . Type 2 diabetes, controlled, with neuropathy ([place]) 10/16/2015  . Gouty arthritis 06/03/2015  . H/O syncope 06/03/2015  . Mild intermittent asthma 06/03/2015  . Multiple gastric ulcers 06/03/2015  . Schatzki's ring 06/03/2015  . Undifferentiated connective tissue disease ([place]) 06/03/2015  . Bone disorder 04/05/2015  . Dental injury 04/05/2015  . Pericarditis 04/05/2015  . Sjogren's syndrome ([place]) 09/07/2014  . Elevated alkaline phosphatase level 08/24/2014  . Lactose intolerance 08/24/2014  . Obesity 08/24/2014  . Peripheral edema 08/24/2014  . 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 Abdominal adhesions 08/02/2014  . 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[place]) 08/02/2014  . Degenerative joint disease (DJD) of lumbar spine 08/02/2014  . Diverticulosis of both small and large intestine 08/02/2014  . Hyperuricemia 08/02/2014  . Pure hypercholesterolemia 08/02/2014  . Trigger finger 08/02/2014  . Right shoulder pain 07/29/2014  . Benign neoplasm of colon 06/30/2010  . Right upper quadrant pain 06/30/2010    Conditions to be addressed/monitored: HTN and DMII, gout  Care Plan : PharmD - Medication Assistance  Updates made by Vella Raring, RPH since 08/08/2020 12:00 AM    Problem: Disease Progression     Long-Range Goal: Disease Progression Prevented or Minimized Completed 08/08/2020  Start Date: 07/04/2020  Expected End Date: 10/02/2020  Recent Progress: On track  Priority: High  Note:   Current Barriers:  . Financial Barriers: patient has [place] [employer]erred [place] insurance and reports copay for Tresiba, Humalog, Bystolic and Colcrys is cost prohibitive at this  time o Patient previously enrolled in patient assistance for Tresiba, Humalog and Colcrys for 2021 calendar year  Pharmacist Clinical Goal(s):  Marland Kitchen Over the next 90 days, patient will verbalize ability to afford treatment regimen through collaboration with PharmD and provider.   Interventions:  1:1 collaboration with Olin Hauser, DO regarding development and update of comprehensive plan of care as evidenced by provider attestation and co-signature . Inter-disciplinary care team collaboration (see longitudinal plan of care) . Today patient reports that she will be switching primary care practices to  [facility], new patient appointment schedule for Dr. Doy Hutching on 3/31.  Medication Assistance: . Review notes and coordination of care message from [place].  o Patient approved for Colcrys patient assistance from [employer] through 06/10/2021 and has received supply of Colcrys in mail o Patient approved for Bystolic patient assistance from [employer] through 06/10/2021 and received 90 day supply from program on 3/4 o Patient approved for Humalog patient assistance from [place] through 06/10/2021.  - Reports patient received shipment of medication to home, but had questions about product as shipment sat on doorstep (did not hear delivery come), but had been packaged in styrofoam with cold packs - Patient also requesting to receive vials rather than Kwikpen for future refills from program o Patient approved for [place] patient assistance from [employer] through 06/10/2021 - Reports patient requesting to receive pen needles for [place] pen. [facility] CPhT notes pen needles not included with original application . Follow up with patient today o Patient reports when Humalog arrived medication was still cool inside cooler. Reports she has inspected medication and denies any discoloration or other concerns. Advise patient to follow up with [facility] if in the future has concerns  about use of medication based on condition of delivery. o Patient reports that she will follow up with new PCP at upcoming appointment regarding preference for switch of Humalog from [facility] to vials o Patient reports that she will need more pen needles for Tresiba pen prior to establishing care with new office . Will collaborate with Dr. Parks Ranger to request provider send [place] patient assistance reorder form to request  . Patient confirms having contact information for each of the patient assistance programs and denies further questions about these programs.  Hypertension: . Perform chart review. Per patient telephone call to Cardiology office on 2/10, patient advised by clinic RN 'based on Dr. Donivan Scull recent visit noted on 1/24, he recommended "she restart clonidine 0.1 mg at least twice daily and Stay on losartan 50 mg twice daily, continue bystolic 10 mg twice daily".' . Reports currently taking: o Clonidine 0.1 mg twice daily o Losartan 50 mg twice daily o Bystolic 10 mg twice daily . Reports home BP has continued to vary. Encourage patient to continue to monitor home BP, keep log of results and follow closely Cardiologist for readings outside of established parameters  Patient Goals/Self-Care Activities . Over the next 90 days, patient will:  - collaborate with provider on medication access solutions  Follow Up Plan: No further follow up required as patient reports that she has schedule appointment to establish care with new PCP office.      Follow Up:  Patient requests no follow-up at this time.  Harlow Asa, PharmD, [place] [phone]

## 2020-08-08 NOTE — Patient Instructions (Signed)
Visit Information  PATIENT GOALS: Goals Addressed   None     The patient verbalized understanding of instructions, educational materials, and care plan provided today and declined offer to receive copy of patient instructions, educational materials, and care plan.   No further follow up required as patient reports that she has schedule appointment to establish care with new PCP office  Harlow Asa, PharmD, [place] [phone]

## 2020-08-18 ENCOUNTER — Other Ambulatory Visit: Payer: Self-pay | Admitting: Cardiovascular Disease

## 2020-08-18 ENCOUNTER — Telehealth: Payer: Self-pay | Admitting: Cardiovascular Disease

## 2020-08-18 NOTE — Telephone Encounter (Signed)
   Pt c/o BP issue: STAT if pt c/o blurred vision, one-sided weakness or slurred speech  1. What are your last 5 BP readings? Trending up cannot get down with current meds today 172/85   2. Are you having any other symptoms (ex. Dizziness, headache, blurred vision, passed out)? No   3. What is your BP issue?  Patient concerned too high and wants help to get down with current medications

## 2020-08-18 NOTE — Telephone Encounter (Signed)
Was able to return [name]'s phone cal regarding her HTN, reports since Wed, her BP has been elevated, [id number] systolic. This morning at 10:30am was 172/85. Has no CP, dizziness, weakness, or shob. Reports "I do not take my medicine like a regular person, I sleep in and stay up late so my medication are off." Reviewed pt's BP medications, reports takes clonidine 0.1 mg TID, Nebivolol 20 mg BID and Losartan 50 mg daily. Reports Dr. Rockey Situ "he had told me I can play around with my medication since nothing really works for me". Advised pt that per order she can take up to 75 mg of Losartan with her clonidine. Advised to take the extra 25 mg, rest, avoid caffeine, and recheck BP. If still running high then may need an appt to discuss medications since she is particular to what she can take and the dosing. [name] verbalized understanding. Advised that if she develops symptoms associated with the HTN then she needs to seek medical attention for possible cardiac or stroke-like symptoms. Again verbalized understanding, Otherwise all questions or concerns were address and no additional concerns at this time. Agreeable to plan, will call back for anything further.

## 2020-08-19 ENCOUNTER — Ambulatory Visit: Payer: Self-pay | Admitting: Pharmacist

## 2020-08-19 DIAGNOSIS — E1140 Type 2 diabetes mellitus with diabetic neuropathy, unspecified: Secondary | ICD-10-CM

## 2020-08-19 NOTE — Patient Instructions (Signed)
Visit Information  PATIENT GOALS: Goals Addressed   None     The patient verbalized understanding of instructions, educational materials, and care plan provided today and declined offer to receive copy of patient instructions, educational materials, and care plan.   Follow Up Plan: No further follow up required as patient reports that she has schedule appointment to establish care with new PCP office.   Harlow Asa, PharmD, [place] [phone]

## 2020-08-19 NOTE — Chronic Care Management (AMB) (Signed)
Chronic Care Management Pharmacy Note  08/19/2020 Name:  [name] MRN:  [id number] DOB:  Oct 26, 1945  Subjective: [name] is an 75 y.o. year old female who is a primary patient of Malfi, [name], FNP.  The CCM team was consulted for assistance with disease management and care coordination needs.    Engaged with patient by telephone for follow up visit in response to provider referral for pharmacy case management and/or care coordination services.   Consent to Services:  The patient was given information about Chronic Care Management services, agreed to services, and gave verbal consent prior to initiation of services.  Please see initial visit note for detailed documentation.   Patient Care Team: Malfi, [name], FNP as PCP - General (Family Medicine) Rockey Situ Kathlene November, MD as PCP - Cardiology (Cardiology) Adelyna Brockman, Virl Diamond, [facility] as Pharmacist  Recent office visits: none   Hospital visits: None in previous 6 months    CCM Care Plan  Allergies  Allergen Reactions  . Amlodipine     Other reaction(s): Unknown  . Aspirin     Other reaction(s): Unknown  . Bee Venom   . Ciprofloxacin     Other reaction(s): Other (see comments), Unknown  . Codeine     Other reaction(s): Unknown Must have pre medications before taking per patient report Includes all derivatives   . Erythromycin     Other reaction(s): Unknown, Unknown  . Glimepiride     Other reaction(s): Unknown  . Hydralazine     Other reaction(s): Unknown  . Hydrochlorothiazide     Other reaction(s): Dizziness or lightheadedness.  . Hydrocodone     Other reaction(s): Unknown Must have pre medications before taking per patient report  . Hydrocodone-Acetaminophen Nausea And Vomiting    MUST BE PREMEDICATED  . Hydromorphone Nausea And Vomiting    Other reaction(s): Unknown Must have pre medications before taking per patient report MUST BE PREMEDICATED   . Irbesartan     Other  reaction(s): Unknown  . Irbesartan-Hydrochlorothiazide     Other reaction(s): Unknown  . Lisinopril     Other reaction(s): Unknown  . Maxitrol [Neomycin-Polymyxin-Dexameth]   . Metformin And Related   . Metformin Hcl     Other reaction(s): Unknown  . Methylcellulose     Other reaction(s): Unknown  . Metoclopramide Nausea And Vomiting    Other reaction(s): Unknown  . Metoprolol     Other reaction(s): Unknown  . Neomycin-Bacitracin Zn-Polymyx     Other reaction(s): Unknown  . Norvasc [Amlodipine Besylate]   . Nsaids Other (See Comments)    Other reaction(s): Unknown bleeding   . Omeprazole Magnesium     Other reaction(s): Unknown  . Other     Pt is allergic to staples and surgical metals  . Oxycodone-Acetaminophen     Other reaction(s): Unknown, Unknown Must have pre medications before taking per patient report   . Penicillins     Other reaction(s): Unknown, Unknown  . Pioglitazone     Other reaction(s): Unknown  . Poison [employer]     Poison [place] also  . Prilosec [Omeprazole]   . Silver Sulfadiazine     Other reaction(s): Unknown  . Sulfa Antibiotics     Other reaction(s): Unknown, Unknown  . Tape     Other reaction(s): Unknown  . Tetanus Toxoid Other (See Comments)  . Tramadol     Other reaction(s): Unknown Must have pre medications before taking per patient report  . Betadine [Povidone  Iodine] Rash    Only topically when left on skin.    Medications Reviewed Today    Reviewed by Lieutenant Diego, PT (Physical Therapist) on 07/05/20 at 1600  Med List Status: <None>  Medication Order Taking? Sig Documenting Provider Last Dose Status Informant  acetaminophen (TYLENOL) 500 MG tablet [id number] No Take 500 mg by mouth daily as needed. [provider] Taking Active   ARTIFICIAL TEAR OP [id number] No Apply to eye as needed.  [provider] Taking Active   BD INSULIN SYRINGE U/F 31G X 5/16" 1 ML MISC [id number] No USE AS DIRECTED. WITH  HUMALOG Mikey College, NP Taking Active   BIOTIN PO [id number] No Take 1 mg by mouth daily.  [provider] Taking Active   Cholecalciferol (VITAMIN D3) 25 MCG (1000 UT) CAPS [id number] No Take 4 capsules by mouth daily.  [provider] Taking Active   clindamycin (CLEOCIN) 300 MG capsule [id number] No Take 600 mg by mouth. 1 Hour before dental procedures [provider] Taking Active   cloNIDine (CATAPRES) 0.1 MG tablet [id number] No Take 1 tablet (0.1 mg) by mouth three times daily Gollan, Kathlene November, MD Taking Active   COLCRYS 0.6 MG tablet [id number] No Take 1 tablet (0.6 mg total) by mouth 2 (two) times daily as needed. For up to 3 days for pericarditis, or up to 7 days for gout or connective tissue disorder. Olin Hauser, DO Taking Active   cyclobenzaprine (FLEXERIL) 5 MG tablet [id number] No Take 1 tablet (5 mg total) by mouth daily as needed for muscle spasms. Malfi, [name], FNP Taking Active   ezetimibe (ZETIA) 10 MG tablet [id number] No Take 1 tablet (10 mg total) by mouth daily. Minna Merritts, MD Taking Active   famotidine (PEPCID) 20 MG tablet [id number] No Take 20 mg by mouth 2 (two) times daily. [provider] Taking Active   FREESTYLE LITE test strip [id number] No USE AS DIRECTED THREE TIMES DAILY FOR DIABETES Malfi, [name], FNP Taking Active   insulin degludec (TRESIBA FLEXTOUCH) 200 UNIT/ML FlexTouch Pen [id number] No Inject 56 Units into the skin daily. Malfi, [name], FNP Taking Active   insulin lispro (HUMALOG) 100 UNIT/ML injection [id number] No Correction scale: take 1 unit per 50 over 150 before meals up to three times daily.  Up to 20 units per day. Mikey College, NP Taking Active   Insulin Pen Needle (PEN NEEDLES) 32G X 5 MM MISC [id number] No 1 Device by Does not apply route daily. Verl Bangs, FNP Taking Active   lactase (LACTAID) 3000 units tablet [id number] No Take by mouth as needed.  [provider]  Taking Active   Lancets (FREESTYLE) lancets [id number] No Must test x4/day [provider] Taking Active   losartan (COZAAR) 50 MG tablet [id number] No Take 50 mg by mouth daily. [provider] Taking Active   Nebivolol HCl 20 MG TABS [id number] No Take 10 mg by mouth in the morning and at bedtime. [provider] Taking Active   ondansetron (ZOFRAN-ODT) 4 MG disintegrating tablet [id number] No Take 1 tablet (4 mg total) by mouth every 8 (eight) hours as needed for nausea or vomiting. Verl Bangs, FNP Taking Active   rosuvastatin (CRESTOR) 10 MG tablet [id number] No TAKE 1 TABLET BY MOUTH EVERY DAY Malfi, [name], FNP Taking Active           Patient Active Problem List   Diagnosis Date Noted  . Nausea 03/31/2020  .  Multiple thyroid nodules 12/28/2019  . Thyroid nodule 09/29/2019  . Lymphedema 11/10/2018  . PAD (peripheral artery disease) ([place]) 10/12/2018  . Aortic atherosclerosis ([place]) 10/12/2018  . Carotid stenosis, asymptomatic, bilateral 10/12/2018  . Positive colorectal cancer screening using Cologuard test 10/09/2018  . Gastroesophageal reflux disease   . Acute gastritis without hemorrhage   . Osteoarthritis 04/29/2018  . Hiatal hernia with GERD 04/29/2018  . Hyperlipidemia 04/29/2018  . Lymphedema of both lower extremities 04/29/2018  . Left knee pain 10/23/2016  . Cataracts, bilateral 07/31/2016  . Glaucoma 07/31/2016  . Lateral epicondylitis of left elbow 10/20/2015  . Vitamin D deficiency 10/20/2015  . Type 2 diabetes, controlled, with neuropathy ([place]) 10/16/2015  . Gouty arthritis 06/03/2015  . H/O syncope 06/03/2015  . Mild intermittent asthma 06/03/2015  . Multiple gastric ulcers 06/03/2015  . Schatzki's ring 06/03/2015  . Undifferentiated connective tissue disease ([place]) 06/03/2015  . Bone disorder 04/05/2015  . Dental injury 04/05/2015  . Pericarditis 04/05/2015  . Sjogren's syndrome ([place]) 09/07/2014  . Elevated alkaline phosphatase  level 08/24/2014  . Lactose intolerance 08/24/2014  . Obesity 08/24/2014  . Peripheral edema 08/24/2014  . 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 Abdominal adhesions 08/02/2014  . 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[place]) 08/02/2014  . Degenerative joint disease (DJD) of lumbar spine 08/02/2014  . Diverticulosis of both small and large intestine 08/02/2014  . Hyperuricemia 08/02/2014  . Pure hypercholesterolemia 08/02/2014  . Trigger finger 08/02/2014  . Right shoulder pain 07/29/2014  . Benign neoplasm of colon 06/30/2010  . Right upper quadrant pain 06/30/2010    Immunization History  Administered Date(s) Administered  . PFIZER(Purple Top)SARS-COV-2 Vaccination 02/21/2020    Conditions to be addressed/monitored: DMII  Care Plan : PharmD - Medication Assistance  Updates made by Vella Raring, RPH since 08/19/2020 12:00 AM    Problem: Disease Progression     Goal: Disease Progression Prevented or Minimized Completed 08/19/2020  Start Date: 08/19/2020  Expected End Date: 08/19/2020  This Visit's Progress: On track  Priority: High  Note:   Current Barriers:  . Financial Barriers: patient has [place] [employer]erred Part D insurance and reports copay for Tresiba, Humalog, Bystolic and Colcrys is cost prohibitive at this time ? Patient APPROVED for patient assistance for [name], Humalog, Bystolic and Colcrys for 1540 calendar year   Pharmacist Clinical Goal(s):   Over the next 90 days, patient will verbalize ability to afford treatment regimen through collaboration with PharmD and provider.    Interventions:  1:1 collaboration with Olin Hauser, DO regarding development and update of comprehensive plan of care as evidenced by provider attestation and co-signature  Inter-disciplinary care team collaboration (see longitudinal plan of care)   Medication Assistance: . Receive coordination of care messages from  Vaughn Simcox/office letting me know that patient's [name] has been received in office from [employer] assistance program and patient has been contacted to pick up this medication. . Follow up with patient today. o Ms. Noell confirm that she will pick up supply of Tresiba from PCP office o Patient has previously advised CM Pharmacist that she will be switching primary care practices to [facility] on 3/31. o Note of the medications patient currently receives from patient assistance programs, only [place] from [employer] is shipped to provider office (other medications shipped to patient's home) - Counsel patient on process for having new PCP send a prescription to [employer] patient  assistance program when she transitions to their care on 3/31, so medication can be shipped to new provider's office. o Patient has confirmed having contact information for each of the patient assistance programs and denies further questions about these programs.     Patient's preferred pharmacy is:  [id number] - [place], [place] [address] [place] [address] Phone: [phone] Fax: [place] [employer], [place] [address] [place] [address] Phone: [phone] Fax: [phone]  Follow Up:  Patient requests no follow-up at this time.  Follow Up Plan: No further follow up required as patient reports that she has schedule appointment to establish care with new PCP office.   Harlow Asa, PharmD, [place] [phone]

## 2020-08-22 ENCOUNTER — Other Ambulatory Visit: Payer: Self-pay | Admitting: Nurse Practitioner

## 2020-08-25 ENCOUNTER — Telehealth: Payer: Self-pay

## 2020-08-25 ENCOUNTER — Other Ambulatory Visit: Payer: Self-pay | Admitting: Nurse Practitioner

## 2020-08-25 NOTE — Telephone Encounter (Signed)
Copied from [place] [id number]. Topic: General - Other >> Aug 25, 2020 11:43 AM Yvette Rack wrote: Reason for CRM: Pt stated there was a mix up at the pharmacy regarding her insulin syringes and she would like the office to be aware that the pharmacy will be sending a Rx refill request. Pt asked that the Rx refill request be approved so she can get the insulin syringes

## 2020-08-30 ENCOUNTER — Other Ambulatory Visit: Payer: Self-pay | Admitting: Nurse Practitioner

## 2020-10-12 ENCOUNTER — Other Ambulatory Visit: Payer: Self-pay | Admitting: Internal Medicine

## 2020-10-12 DIAGNOSIS — N6320 Unspecified lump in the left breast, unspecified quadrant: Secondary | ICD-10-CM

## 2020-10-12 DIAGNOSIS — R928 Other abnormal and inconclusive findings on diagnostic imaging of breast: Secondary | ICD-10-CM

## 2020-10-25 ENCOUNTER — Telehealth: Payer: Self-pay | Admitting: Cardiovascular Disease

## 2020-10-25 NOTE — Telephone Encounter (Signed)
Patient states she had a chest CT yesterday and would like for Dr. Rockey Situ to review the results. Patient also states she is also having mesenteric ischemia, not sure if Dr. Rockey Situ needs to know this or not.

## 2020-10-26 ENCOUNTER — Telehealth: Payer: Self-pay

## 2020-10-26 NOTE — Telephone Encounter (Signed)
CT scan reviewed.

## 2020-10-26 NOTE — Telephone Encounter (Signed)
I called the patient to let her know that her samples of [name] came in to the office and are available for pick up.

## 2020-11-04 ENCOUNTER — Other Ambulatory Visit: Payer: Self-pay

## 2020-11-04 DIAGNOSIS — E7800 Pure hypercholesterolemia, unspecified: Secondary | ICD-10-CM

## 2020-11-04 MED ORDER — ROSUVASTATIN CALCIUM 10 MG PO TABS: 10.0000 mg | ORAL_TABLET | Freq: Every day | ORAL | 1 refills | Status: DC

## 2020-11-08 ENCOUNTER — Other Ambulatory Visit: Payer: Self-pay

## 2020-11-08 ENCOUNTER — Telehealth: Payer: Self-pay | Admitting: Cardiovascular Disease

## 2020-11-08 MED ORDER — LOSARTAN POTASSIUM 50 MG PO TABS: 75.0000 mg | ORAL_TABLET | Freq: Every day | ORAL | 3 refills | Status: DC

## 2020-11-08 NOTE — Telephone Encounter (Signed)
*  STAT* If patient is at the pharmacy, call can be transferred to refill team.   1. Which medications need to be refilled? (please list name of each medication and dose if known) Losartan  2. Which pharmacy/location (including street and city if local pharmacy) is medication to be sent to? [facility]  3. Do they need a 30 day or 90 day supply? 90  Patient states there needs to be a new prescription sent for this medication for twice a day.  Patient only has 1 pill left

## 2020-11-08 NOTE — Telephone Encounter (Signed)
Patient states she is currently taking Losartan 50mg  - 1.5 tablets TWICE A day. Patient is currently out of medication and will need this sent in today.   Per phone note 08/2020 "Advised pt that per order she can take up to 75 mg of Losartan with her clonidine."

## 2020-11-08 NOTE — Telephone Encounter (Signed)
losartan (COZAAR) 50 MG tablet 135 tablet 3 11/08/2020    Sig - Route: Take 1.5 tablets (75 mg total) by mouth daily. - Oral   Sent to pharmacy as: losartan (COZAAR) 50 MG tablet   E-Prescribing Status: Receipt confirmed by pharmacy (11/08/2020 11:49 AM EDT)     Pharmacy  [id number] - [place], [place]

## 2020-11-08 NOTE — Telephone Encounter (Signed)
Patient calling to  Have rx corrected patient states she takes 75 mg po BID and is out of meds please send asap .   If unable to send as above please call.

## 2020-11-09 NOTE — Telephone Encounter (Signed)
Patient calling back to check status of corrected refill and is aware of below.  She would like someone to reach out to Dr. Rockey Situ and get clarity asap so she doesn't have a stroke.   Please call to discuss.

## 2020-11-14 ENCOUNTER — Other Ambulatory Visit: Payer: Self-pay | Admitting: Cardiovascular Disease

## 2020-11-14 MED ORDER — LOSARTAN POTASSIUM 50 MG PO TABS: 50.0000 mg | ORAL_TABLET | Freq: Two times a day (BID) | ORAL | 3 refills | Status: DC

## 2020-11-14 NOTE — Telephone Encounter (Signed)
I sent in a prescription for losartan 50 twice a day That was the dose restarted on my last clinic visit with her Appears that primary care incorrectly entered in computer 75 twice a day in their system, [facility] I typically do not use anything over 100 mg daily, that is typically max dose She may need to correct primary care so they can adjust their medication list If blood pressure running high ,would call me back I can do further adjustments but not to the losartan

## 2020-11-14 NOTE — Telephone Encounter (Signed)
Patient called back in and voiced understanding with there reasoning of the 50 mg Rx

## 2020-11-15 NOTE — Telephone Encounter (Signed)
Please see patient calling back below

## 2020-12-27 ENCOUNTER — Encounter: Payer: Self-pay | Admitting: Internal Medicine

## 2020-12-27 ENCOUNTER — Ambulatory Visit (INDEPENDENT_AMBULATORY_CARE_PROVIDER_SITE_OTHER): Payer: Medicare Other | Admitting: Internal Medicine

## 2020-12-27 ENCOUNTER — Other Ambulatory Visit: Payer: Self-pay

## 2020-12-27 VITALS — BP 140/86 | HR 86 | Ht 69.0 in | Wt 236.0 lb

## 2020-12-27 DIAGNOSIS — I6523 Occlusion and stenosis of bilateral carotid arteries: Secondary | ICD-10-CM

## 2020-12-27 DIAGNOSIS — E042 Nontoxic multinodular goiter: Secondary | ICD-10-CM | POA: Diagnosis not present

## 2020-12-27 NOTE — Progress Notes (Signed)
Patient ID: Sakai Heinle, female   DOB: 11-07-1945, 75 y.o.   MRN: [id number]   This visit occurred during the SARS-CoV-2 public health emergency.  Safety protocols were in place, including screening questions prior to the visit, additional usage of staff PPE, and extensive cleaning of exam room while observing appropriate contact time as indicated for disinfecting solutions.   HPI  Dezzie Badilla is a 75 y.o.-year-old female, returning for follow-up for 2 thyroid nodules. Last visit 1 year ago. She recently started to see Dr. Doy Hutching.  Interim history: She did not develop any neck pressure, swallowing pain or discomfort since last visit. She had a reaction to the Coronavirus vaccine in 02/2020.  Reviewed history: She was found to have a thyroid nodule on a CT chest checked for Ao aneurysm.  Thyroid U/S (12/24/2019) - only the report is available, not the images - 2 complex thyroid nodules: Right superior 0.9 cm nodule - no f/u needed Right inferior 1.5 cm nodule - f/u U/S recommended in a year    Pt denies: - feeling nodules in neck - hoarseness - dysphagia - choking - SOB with lying down  I reviewed pt's thyroid tests and they have been normal: 10/03/2020: TSH 0.679 Lab Results  Component Value Date   TSH 0.841 07/06/2020   TSH 0.901 12/07/2019   TSH 0.69 10/07/2019   TSH 1.17 05/26/2018   FREET4 1.0 05/26/2018    No FH of thyroid ds. No FH of thyroid cancer. + h/o radiation tx to head or neck x2 in the past for tonsillitis - in the 1950. She has a h/o ulcers in her mouth as a child.  No seaweed or kelp. No recent contrast studies. No steroid use recently. No herbal supplements. On Biotin supplements  - 10,000 mcg daily.  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   She is on [place] and Humalog. This is managed by PCP.  However, at this visit, she had the impression that we work on address diabetes, also, however, we did not have time to address it today since this was only scheduled as a simple follow-up appointment.  She also has HL, HTN -she was ruled out for pheochromocytoma. She has a h/o GERD, hiatal hernia, Schatzki's ring, h/o cholangitis.  ROS: + See HPI  Past Medical History:  Diagnosis Date   Anterolisthesis    Cervical spine   Asthma    Connective tissue disorder ([place])    recurrent carotid arteritis, temporal arteritis, vasculitis mandible, general hepatitis, avascular necrosis bilat   DDD (degenerative disc disease), lumbar    Diabetes mellitus without complication (HCC)    Diverticulosis    Duodenitis    Gouty arthritis    Hiatal hernia with GERD    History of cardiovascular stress test    a. 07/2018 [facility]): Fixed inferoapical defect w/ nl contraction-->attenuation artifact. No ischemia. EF 63%.   Hyperlipidemia    Hypertension    a. 02/2019 Renal artery duplex: no evidence of prox RAS.   Hyperuricemia    Keratitis sicca, bilateral (HCC)    Lymphedema of both lower extremities    Osteoarthritis    Pericarditis    Recurrent: Aug 97, July 05, Sept 08, July 16, July 18   PUD (peptic ulcer disease)    Sicca  syndrome ([place])    Sjogren's syndrome ([place])    Syncope    Tendonitis, Achilles, right    Tortuous colon    Trigger finger    Past Surgical History:  Procedure Laterality Date   ABDOMINAL HYSTERECTOMY     BREAST BIOPSY     BREAST EXCISIONAL BIOPSY Left 1986   neg   CHOLECYSTECTOMY     COLONOSCOPY WITH PROPOFOL N/A 08/26/2018   Procedure: COLONOSCOPY WITH PROPOFOL;  Surgeon: Lucilla Lame, MD;  Location: [facility] ENDOSCOPY;  Service: Endoscopy;  Laterality: N/A;   CYSTOSCOPY  02/26/2018   [name], MD    distal arthrectomy      ESOPHAGOGASTRODUODENOSCOPY (EGD) WITH PROPOFOL N/A 08/26/2018   Procedure: ESOPHAGOGASTRODUODENOSCOPY (EGD) WITH PROPOFOL;  Surgeon: Lucilla Lame, MD;  Location: [facility] ENDOSCOPY;  Service: Endoscopy;  Laterality: N/A;   EXCISION NEUROMA     KNEE SURGERY Bilateral    1985, 2001, 11/2002, 12/2006   panhysterectomy  10/1983   SHOULDER SURGERY Right    reverse total arthroplasty w/ biceps tenodesis   TONSILLECTOMY AND ADENOIDECTOMY     TOTAL HIP ARTHROPLASTY Right 04/2007   WISDOM TOOTH EXTRACTION     Social History   Socioeconomic History   Marital status: Divorced    Spouse name: Not on file   Number of children: 0   Years of education: Not on file   Highest education level: Associate degree: academic program  Occupational History   Occupation: Retired  Tobacco Use   Smoking status: Former    Types: Cigarettes    Quit date: 01/22/1996    Years since quitting: 24.9   Smokeless tobacco: Never  Vaping Use   Vaping Use: Never used  Substance and Sexual Activity   Alcohol use: Yes    Comment: occasional-once every 6 months   Drug use: Never   Sexual activity: Not on file  Other Topics Concern   Not on file  Social History Narrative   Not on file   Social Determinants of Health   Financial Resource Strain: Not on file  Food Insecurity: Not on file  Transportation Needs: Not on file  Physical Activity: Not on file  Stress: Not on file  Social Connections: Not on file  Intimate Partner Violence: Not on file   Current Outpatient Medications on File Prior to Visit  Medication Sig Dispense Refill   acetaminophen (TYLENOL) 500 MG tablet Take 500 mg by mouth daily as needed.     ARTIFICIAL TEAR OP Apply to eye as needed.      BD INSULIN SYRINGE U/F 31G X 5/16" 0.5 ML MISC USE AS DIRECTED 100 each 2   BD INSULIN SYRINGE U/F 31G X 5/16" 1 ML MISC USE AS DIRECTED. WITH HUMALOG 100 each 5   BIOTIN PO Take 1 mg by mouth daily.      Cholecalciferol (VITAMIN D3) 25 MCG (1000 UT) CAPS  Take 4 capsules by mouth daily.      clindamycin (CLEOCIN) 300 MG capsule Take 600 mg by mouth. 1 Hour before dental procedures     cloNIDine (CATAPRES) 0.1 MG tablet Take 1 tablet (0.1 mg) by mouth three times daily 270 tablet 3   COLCRYS 0.6 MG tablet Take 1 tablet (0.6 mg total) by mouth 2 (two) times daily as needed. For up to 3 days for pericarditis, or up to 7 days for gout or connective tissue disorder. 60 tablet 2   cyclobenzaprine (FLEXERIL) 5 MG tablet Take 1 tablet (5 mg total) by mouth daily  as needed for muscle spasms. 30 tablet 2   ezetimibe (ZETIA) 10 MG tablet TAKE ONE TABLET BY MOUTH DAILY 90 tablet 2   famotidine (PEPCID) 20 MG tablet Take 20 mg by mouth 2 (two) times daily.     FREESTYLE LITE test strip USE AS DIRECTED THREE TIMES DAILY FOR DIABETES 100 strip 4   insulin degludec (TRESIBA FLEXTOUCH) 200 UNIT/ML FlexTouch Pen Inject 56 Units into the skin daily. 3 pen 11   insulin lispro (HUMALOG) 100 UNIT/ML injection Correction scale: take 1 unit per 50 over 150 before meals up to three times daily.  Up to 20 units per day. 20 mL 1   Insulin Pen Needle (PEN NEEDLES) 32G X 5 MM MISC 1 Device by Does not apply route daily. 100 each 3   lactase (LACTAID) 3000 units tablet Take by mouth as needed.      Lancets (FREESTYLE) lancets Must test x4/day     losartan (COZAAR) 50 MG tablet Take 1 tablet (50 mg total) by mouth 2 (two) times daily. 180 tablet 3   Nebivolol HCl 20 MG TABS Take 10 mg by mouth in the morning and at bedtime.     ondansetron (ZOFRAN-ODT) 4 MG disintegrating tablet Take 1 tablet (4 mg total) by mouth every 8 (eight) hours as needed for nausea or vomiting. 30 tablet 0   rosuvastatin (CRESTOR) 10 MG tablet Take 1 tablet (10 mg total) by mouth daily. 90 tablet 1   No current facility-administered medications on file prior to visit.   Allergies  Allergen Reactions   Amlodipine     Other reaction(s): Unknown   Aspirin     Other reaction(s): Unknown   Bee Venom     Ciprofloxacin     Other reaction(s): Other (see comments), Unknown   Codeine     Other reaction(s): Unknown Must have pre medications before taking per patient report Includes all derivatives    Erythromycin     Other reaction(s): Unknown, Unknown   Glimepiride     Other reaction(s): Unknown   Hydralazine     Other reaction(s): Unknown   Hydrochlorothiazide     Other reaction(s): Dizziness or lightheadedness.   Hydrocodone     Other reaction(s): Unknown Must have pre medications before taking per patient report   Hydrocodone-Acetaminophen Nausea And Vomiting    MUST BE PREMEDICATED   Hydromorphone Nausea And Vomiting    Other reaction(s): Unknown Must have pre medications before taking per patient report MUST BE PREMEDICATED    Irbesartan     Other reaction(s): Unknown   Irbesartan-Hydrochlorothiazide     Other reaction(s): Unknown   Lisinopril     Other reaction(s): Unknown   Maxitrol [Neomycin-Polymyxin-Dexameth]    Metformin And Related    Metformin Hcl     Other reaction(s): Unknown   Methylcellulose     Other reaction(s): Unknown   Metoclopramide Nausea And Vomiting    Other reaction(s): Unknown   Metoprolol     Other reaction(s): Unknown   Neomycin-Bacitracin Zn-Polymyx     Other reaction(s): Unknown   Norvasc [Amlodipine Besylate]    Nsaids Other (See Comments)    Other reaction(s): Unknown bleeding    Omeprazole Magnesium     Other reaction(s): Unknown   Other     Pt is allergic to staples and surgical metals   Oxycodone-Acetaminophen     Other reaction(s): Unknown, Unknown Must have pre medications before taking per patient report    Penicillins     Other reaction(s):  Unknown, Unknown   Pioglitazone     Other reaction(s): Unknown   Poison [place] Extract     Poison [place] and [place] also   Prilosec [Omeprazole]    Silver Sulfadiazine     Other reaction(s): Unknown   Sulfa Antibiotics     Other reaction(s): Unknown, Unknown   Tape     Other  reaction(s): Unknown   Tetanus Toxoid Other (See Comments)   Tramadol     Other reaction(s): Unknown Must have pre medications before taking per patient report   Betadine [Povidone Iodine] Rash    Only topically when left on skin.   Family History  Problem Relation Age of Onset   Hypertension Mother    Diabetes Mother    Hyperlipidemia Mother    Congestive Heart Failure Mother    Cancer Mother    Non-Hodgkin's lymphoma Sister    Cancer Sister    Diabetes Maternal Grandmother    Breast cancer Neg Hx    PE: BP 140/86 (BP Location: Right Arm, Patient Position: Sitting, Cuff Size: Normal)   Pulse 86   Ht 5\' 9"  (1.753 m)   Wt 236 lb (107 kg)   SpO2 96%   BMI 34.85 kg/m  Wt Readings from Last 3 Encounters:  12/27/20 236 lb (107 kg)  07/04/20 234 lb (106.1 kg)  12/28/19 238 lb (108 kg)   Constitutional: overweight, in NAD Eyes: PERRLA, EOMI, no exophthalmos ENT: moist mucous membranes, no thyromegaly, no nodules palpated in anterior neck,  no cervical lymphadenopathy Cardiovascular: RRR, No MRG, + significant pitting BLE edema (lymphedema)-wears compression hoses Respiratory: CTA B Gastrointestinal: abdomen soft, NT, ND, BS+ Musculoskeletal: no deformities, strength intact in all 4;  Skin: moist, warm, no rashes Neurological: no tremor with outstretched hands, DTR normal in all 4  ASSESSMENT: 1. Thyroid nodules  PLAN: 1. Thyroid nodules -I reviewed the report of her 2021 thyroid ultrasound along with the patient (images not available.  The dominant nodules are small and overall not worrisome.  The right side.  Nodule measures 0.9 cm in the largest dimension, it is complex (has a cystic component) and isoechoic.  There are no concerning features like increased blood flow, microcalcifications, irregular margins, or taller than wide distribution.  No follow-up is needed for this nodule. The right inferior nodule is larger, and 1.5 cm in the largest dimension, it is also  complex, but it is hypoechoic.  Again, no concerning ultrasound features as mentioned above.  For this nodule repeat ultrasound in a year is necessary, but no biopsy was indicated.  -She does not have a family history of thyroid cancer or personal history of radiation therapy to head or neck, favoring benignity. -We discussed that in general, even if present, thyroid cancer is a slow-growing cancer with good outcomes in terms of life expectancy and quality of life. -For now, we will follow the right inferior nodule by repeating another ultrasound. -No neck compression symptoms -We reviewed together her latest latest TSH and this was normal, and 0.679, on 10/03/2020.  We will not repeat this today. -We will have her back in another year  As mentioned above, we did not address her diabetes at this visit.  I advised her that if she decides to return to see me for diabetes, will need to schedule a sooner appointment, for 40 minutes.  Orders Placed This Encounter  Procedures   US THYROID   Philemon Kingdom, MD PhD [facility]

## 2020-12-27 NOTE — Patient Instructions (Addendum)
Please get another thyroid ultrasound.  Please return to see me in 1 year.

## 2021-01-02 ENCOUNTER — Ambulatory Visit
Admission: RE | Admit: 2021-01-02 | Discharge: 2021-01-02 | Disposition: A | Discharge status: Home / Self Care | Payer: Medicare Other | Source: Ambulatory Visit | Attending: Internal Medicine | Admitting: Internal Medicine

## 2021-01-02 ENCOUNTER — Other Ambulatory Visit: Payer: Self-pay

## 2021-01-02 DIAGNOSIS — N6320 Unspecified lump in the left breast, unspecified quadrant: Secondary | ICD-10-CM

## 2021-01-02 DIAGNOSIS — R928 Other abnormal and inconclusive findings on diagnostic imaging of breast: Secondary | ICD-10-CM

## 2021-01-04 ENCOUNTER — Ambulatory Visit: Payer: Medicare Other | Attending: Internal Medicine | Admitting: Physical Therapy

## 2021-01-04 ENCOUNTER — Other Ambulatory Visit: Payer: Self-pay

## 2021-01-04 ENCOUNTER — Encounter: Payer: Self-pay | Admitting: Physical Therapy

## 2021-01-04 DIAGNOSIS — M6281 Muscle weakness (generalized): Secondary | ICD-10-CM | POA: Diagnosis present

## 2021-01-04 DIAGNOSIS — R262 Difficulty in walking, not elsewhere classified: Secondary | ICD-10-CM | POA: Diagnosis present

## 2021-01-04 DIAGNOSIS — M25552 Pain in left hip: Secondary | ICD-10-CM | POA: Insufficient documentation

## 2021-01-04 NOTE — Therapy (Signed)
[facility] [address]. [place], [place], [address] Phone: [phone]   Fax:  [phone]  Physical Therapy Evaluation  Patient Details  Name: [name] MRN: [id number] Date of Birth: 1945/09/27 Referring Provider (PT): Rolanda Jay [place], [place]   Encounter Date: 01/04/2021   PT End of Session - 01/04/21 1900     Visit Number 1    Number of Visits 17    Date for PT Re-Evaluation 03/01/21    Authorization - Visit Number 1    Progress Note Due on Visit 10    PT Start Time 1640    PT Stop Time [id number]    PT Time Calculation (min) 45 min    Activity Tolerance Patient tolerated treatment well;Patient limited by pain    Behavior During Therapy [facility] for tasks assessed/performed             Past Medical History:  Diagnosis Date   Anterolisthesis    Cervical spine   Asthma    Connective tissue disorder ([place])    recurrent carotid arteritis, temporal arteritis, vasculitis mandible, general hepatitis, avascular necrosis bilat   DDD (degenerative disc disease), lumbar    Diabetes mellitus without complication ([place])    Diverticulosis    Duodenitis    Ganglion cyst of right foot    Gouty arthritis    Hiatal hernia with GERD    History of cardiovascular stress test    a. 07/2018 [facility]): Fixed inferoapical defect w/ nl contraction-->attenuation artifact. No ischemia. EF 63%.   Hyperlipidemia    Hypertension    a. 02/2019 Renal artery duplex: no evidence of prox RAS.   Hyperuricemia    Keratitis sicca, bilateral (HCC)    Lymphedema of both lower extremities    Osteoarthritis    Pericarditis    Recurrent: Aug 97, July 05, Sept 08, July 16, July 18   PUD (peptic ulcer disease)    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[place])    Sjogren's syndrome ([place])    Syncope    Tendonitis, Achilles, right    Tortuous colon    Trigger finger     Past Surgical History:  Procedure Laterality Date   ABDOMINAL HYSTERECTOMY     BREAST BIOPSY      BREAST EXCISIONAL BIOPSY Left 1986   neg   CHOLECYSTECTOMY     COLONOSCOPY WITH PROPOFOL N/A 08/26/2018   Procedure: COLONOSCOPY WITH PROPOFOL;  Surgeon: Lucilla Lame, MD;  Location: [facility] ENDOSCOPY;  Service: Endoscopy;  Laterality: N/A;   CYSTOSCOPY  02/26/2018   [name], MD    distal arthrectomy     ESOPHAGOGASTRODUODENOSCOPY (EGD) WITH PROPOFOL N/A 08/26/2018   Procedure: ESOPHAGOGASTRODUODENOSCOPY (EGD) WITH PROPOFOL;  Surgeon: Lucilla Lame, MD;  Location: [facility] ENDOSCOPY;  Service: Endoscopy;  Laterality: N/A;   EXCISION NEUROMA     KNEE SURGERY Bilateral    1985, 2001, 11/2002, 12/2006   panhysterectomy  10/1983   SHOULDER SURGERY Right    reverse total arthroplasty w/ biceps tenodesis   TONSILLECTOMY AND ADENOIDECTOMY     TOTAL HIP ARTHROPLASTY Right 04/2007   WISDOM TOOTH EXTRACTION      There were no vitals filed for this visit.    Subjective Assessment - 01/04/21 1638     Subjective Patient is a 75 year old female with previous episode of PT in this clinic related to low back pain. Patient has current primary complaint of L hip pain.    Pertinent History Patient is a 75 year old female with previous episode  of PT in this clinic related to low back pain. Patient has current primary complaint of L hip pain. Patient reports she was going ot sit onto toilet and was going to sit and fell into sitting in uncontrolled manner with weight shifted onto her L hip (DOI: 12/08/2020). She states she had pain at the time that worsened more the following day. Patient reports history of bursitis in L hip with multiple episode since the mid 1980s. Patient reports pain affecting posteiror gluteal and L SI region. Pt has history of L AVN. Patient had radiographs this past Thursday - evidence of degenerative changes and known lumbar DDD. Patient reports 4/10 presently. Patient reports 10/10 pain at the worst. Patient reports no pain lower than 4/10. Patient denies numbness and tingling. Patient  reports lower pain if she doesn't "use it." Aggravating factors: walking, more weightbearing, stair ascent>descent.. Easing factors: resting it, "not doing anything." Hx of R rTSA, R THA, L partial knee replacement. Goal: get rid of pain. She reports pain with every step of walking.    Limitations Walking;Standing;House hold activities                [facility] PT Assessment - 01/04/21 1858       Assessment   Medical Diagnosis Left hip pain    Referring Provider (PT) Rolanda Jay [place], [place]    Onset Date/Surgical Date 12/08/20    Next MD Visit None    Prior Therapy PT for low back in 2021 and early 2022      Balance Screen   Has the patient fallen in the past 6 months No      Prior Function   Level of Independence Independent      Cognition   Overall Cognitive Status Within Functional Limits for tasks assessed            Radiating pain: No  Numbness/Tingling: No  Follow-up appointment with MD: No  Imaging: Yes  Falls in the last 6 months: No      OBJECTIVE  Mental Status Patient is oriented to person, place and time.  Recent memory is intact.  Remote memory is intact.  Attention span and concentration are intact.  Expressive speech is intact.  Patient's fund of knowledge is within normal limits for educational level.  SENSATION: Deferred   MUSCULOSKELETAL: Tremor: None Bulk: Normal Tone: Normal   Posture Lumbar lordosis: Decreased Iliac crest height: equal bilaterally Increased thoracic kyphosis, rounded shoulders  Gait Decreased stance time on LLE, mild ipsilateral (L) abductor lurch, decreased step length   Palpation Tenderness to palpation along L greater trochanter 2+, L gluteus medius and minimus 2+, ischial tuberosity 2+   Strength (out of 5) R/L 4/4 Hip flexion 3+/4* Hip ER 4-/4+* Hip IR 4/4* Hip abduction (seated) 5/5 Hip adduction 4/4 Knee extension 4/4 Knee flexion *Indicates pain   AROM Lumbar forward flexion (65):  100% Lumbar extension (30): 50% Lumbar lateral flexion (25): R: 50%* L: 100% Thoracic and Lumbar rotation (30 degrees):  R: 50% L: 50% *Indicates pain   PROM (degrees) R/L Hip flexion: Not tested/120 Hip external rotation: Not tested/45* (pain end-range) Hip internal rotation: Not tested/30* Hip abduction: Not tested/WNL    Passive Accessory Intervertebral Motion (PAIVM) Pt denies reproduction of concordant sign with CPA L1-S2. Generally hypomobile and painful with PA mobilization throughout lumbar spine.    SPECIAL TESTS Lumbar Radiculopathy and Discogenic:  SLR (SN 92, -LR 0.29): R: Negative L:  Negative Crossed SLR (SP 90): R: Negative L:  Negative  Hip: FABER (SN 81): R: Not examined L: Positive FADIR (SN 94): R: Not done L: Not done Hip scour (SN 50): R: Not examined L: Positive  SIJ:  Thigh Thrust (SN 88, -LR 0.18) : R: Not done L: Positive for pain in back Sacral Thrust: Negative (no concordant sign reproduced) Anterior compression: Negative (pain along ileum only - area of direct pressure)   Functional Tasks Rolling: Difficulty with managing RUE from sidelying to prone position and L hip pain with translating pelvis and bridging on table  Sit to stand: Significant UE support and weight shifted to RLE with difficulty and pain behaviors with achieving neutral standing position   ASSESSMENT Clinical Impression: Pt is a pleasant 75 year old female referred for L hip pain. Patient has pain with direct palpation diffusely of gluteal musculature with concordant sign reproduced and pain with loading hip ER/IR. Negative provocative testing for SIJ/L-spine. Pt does have history of bursitis that could be contributing to sensitivity along L hip. Clinical presentation is consistent with contusion and soft tissue injury versus sacroiliac joint or lumbar spine being primary pain generators. PT examination reveals deficits in gait, sit to stand, bed mobility, tolerance of hip ROM,  gluteal/hip flexor/ER/IR/quad and hamstrings strength, and ability to bear full weight onto LLE. Pt presents with deficits in strength, mobility, range of motion, and local nociceptive L gluteal pain. Pt will benefit from skilled PT services to address deficits and return to pain-free function at home and in community.       PT Short Term Goals - 01/05/21 1655       PT SHORT TERM GOAL #1   Title Pt will be independent and 100% compliant with established HEP and activity modification as needed to augment PT intervention and prevent flare-up of hip pain as needed for best return to prior level of function.    Baseline 01/04/21: discussed palliative care and activity modification; initiating HEP next visit    Time 3    Period Weeks    Status New    Target Date 01/26/21               PT Long Term Goals - 01/04/21 1909       PT LONG TERM GOAL #1   Title Patient will demonstrate improved function as evidenced by a score of 47 on FOTO measure for full participation in activities at home and in the community.    Baseline 01/04/21: FOTO 27    Time 8    Period Weeks    Status New    Target Date 03/01/21      PT LONG TERM GOAL #2   Title Patient will have hip strength to 4+/5 or greater for all motions tested without reproduction of pain indicative of improved capacity for loading paraspinal/pelvic and gluteal mm as needed for performance of transferring, stair negotiation    Baseline 01/04/21: 4/5 hip flexors and abduction(seated), 4/5 quad and HS, weak hip ER,    Time 8    Period Weeks    Status New    Target Date 03/01/21      PT LONG TERM GOAL #3   Title Patient will perform stair negotiaiton in clinic, ascending and descending 4 steps with unilateral handrail support and no loss of balance or buckling independently without net increase in hip/back pain as needed for accessing home and community    Baseline 01/04/21: Significant pain with stair negotiation    Time 8    Period  Weeks  Status New    Target Date 03/01/21      PT LONG TERM GOAL #4   Title Patient will have no increase in pain with functional hip ROM in all planes as needed for lower limb management during self-care tasks, car transfers, and bed mobility tasks    Baseline 01/04/21: Pain with end-range hip ER and IR    Time 6    Period Weeks    Status New    Target Date 02/15/21      PT LONG TERM GOAL #5   Title Patient will perform independent sit to stand from 19-20 inch chair with no UE support and no reproduction of hip pain indicative of improved ability to perform transferring    Baseline 01/04/21: difficulty and pain with sit to stand    Time 6    Period Weeks    Status New    Target Date 02/15/21                    Plan - 01/05/21 1715     Clinical Impression Statement Clinical Impression: Pt is a pleasant 76 year old female referred for L hip pain. Patient has pain with direct palpation diffusely of gluteal musculature with concordant sign reproduced and pain with loading hip ER/IR. Negative provocative testing for SIJ/L-spine. Pt does have history of bursitis that could be contributing to sensitivity along L hip. Clinical presentation is consistent with contusion and soft tissue injury versus sacroiliac joint or lumbar spine being primary pain generators. PT examination reveals deficits in gait, sit to stand, bed mobility, tolerance of hip ROM, gluteal/hip flexor/ER/IR/quad and hamstrings strength, and ability to bear full weight onto LLE. Pt presents with deficits in strength, mobility, range of motion, and local nociceptive L gluteal pain. Pt will benefit from skilled PT services to address deficits and return to pain-free function at home and in community.    Personal Factors and Comorbidities Education;Comorbidity 3+;Age;Time since onset of injury/illness/exacerbation    Comorbidities AAA, AVN, HTN, Hx of bilateral LE edema, connective tissue disorder, Type 2 DM     Examination-Activity Limitations Locomotion Level;Squat;Stairs;Stand;Transfers;Bed Mobility    Examination-Participation Restrictions Driving;Community Activity;Cleaning    Stability/Clinical Decision Making Evolving/Moderate complexity    Clinical Decision Making Moderate    Rehab Potential Fair    PT Frequency 2x / week    PT Duration 8 weeks    PT Treatment/Interventions ADLs/Self Care Home Management;Electrical Stimulation;Moist Heat;Gait training;Stair training;Functional mobility training;Neuromuscular re-education;Balance training;Therapeutic exercise;Therapeutic activities;Patient/family education;Manual techniques;Passive range of motion    PT Next Visit Plan Manual therapy for desensitization and soft tissue mobility of affected gluteal musculature, gentle stretching and isometrics. Will progress with gentle gluteal activation and hip mobility work as tolerated.    PT Home Exercise Plan will initiate HEP next visit    Consulted and Agree with Plan of Care Patient             Patient will benefit from skilled therapeutic intervention in order to improve the following deficits and impairments:  Abnormal gait, Pain, Postural dysfunction, Decreased mobility, Decreased activity tolerance, Decreased endurance, Decreased range of motion, Decreased strength, Impaired flexibility, Difficulty walking  Visit Diagnosis: Pain in left hip  Difficulty in walking, not elsewhere classified  Muscle weakness (generalized)     Problem List Patient Active Problem List   Diagnosis Date Noted   Nausea 03/31/2020   Multiple thyroid nodules 12/28/2019   Thyroid nodule 09/29/2019   Lymphedema 11/10/2018   PAD (peripheral artery disease) ([place]) 10/12/2018  Aortic atherosclerosis ([place]) 10/12/2018   Carotid stenosis, asymptomatic, bilateral 10/12/2018   Positive colorectal cancer screening using Cologuard test 10/09/2018   Gastroesophageal reflux disease    Acute gastritis without hemorrhage     Osteoarthritis 04/29/2018   Hiatal hernia with GERD 04/29/2018   Hyperlipidemia 04/29/2018   Lymphedema of both lower extremities 04/29/2018   Left knee pain 10/23/2016   Cataracts, bilateral 07/31/2016   Glaucoma 07/31/2016   Lateral epicondylitis of left elbow 10/20/2015   Vitamin D deficiency 10/20/2015   Type 2 diabetes, controlled, with neuropathy ([place]) 10/16/2015   Gouty arthritis 06/03/2015   H/O syncope 06/03/2015   Mild intermittent asthma 06/03/2015   Multiple gastric ulcers 06/03/2015   Schatzki's ring 06/03/2015   Undifferentiated connective tissue disease ([place]) 06/03/2015   Bone disorder 04/05/2015   Dental injury 04/05/2015   Pericarditis 04/05/2015   Sjogren's syndrome ([place]) 09/07/2014   Elevated alkaline phosphatase level 08/24/2014   Lactose intolerance 08/24/2014   Obesity 08/24/2014   Peripheral edema 08/24/2014   Benign hypertension with CKD (chronic kidney disease) stage III 08/03/2014   Abdominal adhesions 08/02/2014   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 [place], [place], [address] Phone: [phone]   Fax:  [phone]  Name: Hau Dannenberg MRN: [id number] Date of Birth: 1946/05/30

## 2021-01-05 NOTE — Addendum Note (Signed)
Addended by: Eilleen Kempf on: 01/05/2021 05:25 PM   Modules accepted: Orders

## 2021-01-11 ENCOUNTER — Ambulatory Visit: Payer: Medicare Other | Attending: Family Medicine | Admitting: Physical Therapy

## 2021-01-11 ENCOUNTER — Other Ambulatory Visit: Payer: Self-pay

## 2021-01-11 DIAGNOSIS — M6281 Muscle weakness (generalized): Secondary | ICD-10-CM

## 2021-01-11 DIAGNOSIS — M25552 Pain in left hip: Secondary | ICD-10-CM | POA: Diagnosis not present

## 2021-01-11 DIAGNOSIS — R262 Difficulty in walking, not elsewhere classified: Secondary | ICD-10-CM | POA: Diagnosis present

## 2021-01-11 NOTE — Therapy (Signed)
[facility] [address] [place], [place], [address] Phone: [phone]   Fax:  [phone]  Physical Therapy Treatment  Patient Details  Name: [name] MRN: [id number] Date of Birth: [birth year] Referring Provider (PT): Rolanda Jay [place], [place]   Encounter Date: 01/11/2021   PT End of Session - 01/11/21 1732     Visit Number 2    Number of Visits 17    Date for PT Re-Evaluation 03/01/21    Authorization - Visit Number 2    Progress Note Due on Visit 10    PT Start Time [id number]    PT Stop Time 1801    PT Time Calculation (min) 31 min    Activity Tolerance Patient tolerated treatment well;Patient limited by pain    Behavior During Therapy [facility] for tasks assessed/performed             Past Medical History:  Diagnosis Date   Anterolisthesis    Cervical spine   Asthma    Connective tissue disorder ([place])    recurrent carotid arteritis, temporal arteritis, vasculitis mandible, general hepatitis, avascular necrosis bilat   DDD (degenerative disc disease), lumbar    Diabetes mellitus without complication ([place])    Diverticulosis    Duodenitis    Ganglion cyst of right foot    Gouty arthritis    Hiatal hernia with GERD    History of cardiovascular stress test    a. 07/2018 [facility]): Fixed inferoapical defect w/ nl contraction-->attenuation artifact. No ischemia. EF 63%.   Hyperlipidemia    Hypertension    a. 02/2019 Renal artery duplex: no evidence of prox RAS.   Hyperuricemia    Keratitis sicca, bilateral (HCC)    Lymphedema of both lower extremities    Osteoarthritis    Pericarditis    Recurrent: Aug 97, July 05, Sept 08, July 16, July 18   PUD (peptic ulcer disease)    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[place])    Sjogren's syndrome ([place])    Syncope    Tendonitis, Achilles, right    Tortuous colon    Trigger finger     Past Surgical History:  Procedure Laterality Date   ABDOMINAL HYSTERECTOMY     BREAST BIOPSY      BREAST EXCISIONAL BIOPSY Left 1986   neg   CHOLECYSTECTOMY     COLONOSCOPY WITH PROPOFOL N/A 08/26/2018   Procedure: COLONOSCOPY WITH PROPOFOL;  Surgeon: Lucilla Lame, MD;  Location: [facility] ENDOSCOPY;  Service: Endoscopy;  Laterality: N/A;   CYSTOSCOPY  02/26/2018   [name], MD    distal arthrectomy     ESOPHAGOGASTRODUODENOSCOPY (EGD) WITH PROPOFOL N/A 08/26/2018   Procedure: ESOPHAGOGASTRODUODENOSCOPY (EGD) WITH PROPOFOL;  Surgeon: Lucilla Lame, MD;  Location: [facility] ENDOSCOPY;  Service: Endoscopy;  Laterality: N/A;   EXCISION NEUROMA     KNEE SURGERY Bilateral    1985, 2001, 11/2002, 12/2006   panhysterectomy  10/1983   SHOULDER SURGERY Right    reverse total arthroplasty w/ biceps tenodesis   TONSILLECTOMY AND ADENOIDECTOMY     TOTAL HIP ARTHROPLASTY Right 04/2007   WISDOM TOOTH EXTRACTION      There were no vitals filed for this visit.   Subjective Assessment - 01/11/21 1732     Subjective Pt arrives 15 minutes following appointment time due to her vehicle being blocked in at home. She reports some pain remaining at arrival to PT, though it has improved. She reports icing her hip at home. 4/10 pain at arrival this  evening. She states she has not been able to use SPC due to LUE swelling. She states she cannot use contralateral UE (R hand for cane) as recommended due to comorbid R shoulder weakness and mobility deficits.    Pertinent History Patient is a 75 year old female with previous episode of PT in this clinic related to low back pain. Patient has current primary complaint of L hip pain. Patient reports she was going ot sit onto toilet and was going to sit and fell into sitting in uncontrolled manner with weight shifted onto her L hip (DOI: 12/08/2020). She states she had pain at the time that worsened more the following day. Patient reports history of bursitis in L hip with multiple episode since the mid 1980s. Patient reports pain affecting posteiror gluteal and L SI region. Pt has  history of L AVN. Patient had radiographs this past Thursday - evidence of degenerative changes and known lumbar DDD. Patient reports 4/10 presently. Patient reports 10/10 pain at the worst. Patient reports no pain lower than 4/10. Patient denies numbness and tingling. Patient reports lower pain if she doesn't "use it." Aggravating factors: walking, more weightbearing, stair ascent>descent.. Easing factors: resting it, "not doing anything." Hx of R rTSA, R THA, L partial knee replacement. Goal: get rid of pain. She reports pain with every step of walking.    Limitations Walking;Standing;House hold activities    Patient Stated Goals Decrease back pain/ improve strength/ mobility.    Pain Onset --   30 years            TREATMENT PERFORMED   Manual Therapy - for symptom modulation, soft tissue sensitivity and mobility, joint mobility, ROM  In sidelying, STM and Hypervolt along L gluteus medius and minimus Long-axis distraction, gr II for pain relief L Hip PROM within pt tolerance    [hypersensitivity of semitendinosus with notable pain reported with supporting knee around this region]    Therapeutic Exercise - for improved soft tissue mobility, lumbopelvic/hip mobility, isometrics for analgesic effect  Lower trunk rotations; x10 each direction Supine piriformis stretch; 3x30sec Hip ABD and ADD isometrics, with belt and ball; x10, 5 sec  Patient education: HEP initiation and review of exercises   *next visit* NuStep x 5 minutes for improved soft tissue extensibility/mobility prior to manual therapy and exercise;      ASSESSMENT Patient has significant orthopedic history and requires frequent re-directing to task at hand - this can impact volume of activity performed in clinic. Patient has primary region of sensitivity along gluteus medius/minimus region and along posterior iliac crest. Patient responds well to gentle distraction and IASTM utilized this evening. Tonight's visit was  abbreviated due to late arrival time. Patient has remaining deficits in hip strength, functional mobility, LLE weightbearing tolerance and gait changes, and L gluteal pain. Patient will benefit from continued skilled therapeutic intervention to address the above deficits as needed for improved function and QoL.      PT Short Term Goals - 01/05/21 1655       PT SHORT TERM GOAL #1   Title Pt will be independent and 100% compliant with established HEP and activity modification as needed to augment PT intervention and prevent flare-up of hip pain as needed for best return to prior level of function.    Baseline 01/04/21: discussed palliative care and activity modification; initiating HEP next visit    Time 3    Period Weeks    Status New    Target Date 01/26/21  PT Long Term Goals - 01/04/21 1909       PT LONG TERM GOAL #1   Title Patient will demonstrate improved function as evidenced by a score of 47 on FOTO measure for full participation in activities at home and in the community.    Baseline 01/04/21: FOTO 27    Time 8    Period Weeks    Status New    Target Date 03/01/21      PT LONG TERM GOAL #2   Title Patient will have hip strength to 4+/5 or greater for all motions tested without reproduction of pain indicative of improved capacity for loading paraspinal/pelvic and gluteal mm as needed for performance of transferring, stair negotiation    Baseline 01/04/21: 4/5 hip flexors and abduction(seated), 4/5 quad and HS, weak hip ER,    Time 8    Period Weeks    Status New    Target Date 03/01/21      PT LONG TERM GOAL #3   Title Patient will perform stair negotiaiton in clinic, ascending and descending 4 steps with unilateral handrail support and no loss of balance or buckling independently without net increase in hip/back pain as needed for accessing home and community    Baseline 01/04/21: Significant pain with stair negotiation    Time 8    Period Weeks    Status  New    Target Date 03/01/21      PT LONG TERM GOAL #4   Title Patient will have no increase in pain with functional hip ROM in all planes as needed for lower limb management during self-care tasks, car transfers, and bed mobility tasks    Baseline 01/04/21: Pain with end-range hip ER and IR    Time 6    Period Weeks    Status New    Target Date 02/15/21      PT LONG TERM GOAL #5   Title Patient will perform independent sit to stand from 19-20 inch chair with no UE support and no reproduction of hip pain indicative of improved ability to perform transferring    Baseline 01/04/21: difficulty and pain with sit to stand    Time 6    Period Weeks    Status New    Target Date 02/15/21                   Plan - 01/12/21 1228     Clinical Impression Statement Patient has significant orthopedic history and requires frequent re-directing to task at hand - this can impact volume of activity performed in clinic. Patient has primary region of sensitivity along gluteus medius/minimus region and along posterior iliac crest. Patient responds well to gentle distraction and IASTM utilized this evening. Tonight's visit was abbreviated due to late arrival time. Patient has remaining deficits in hip strength, functional mobility, LLE weightbearing tolerance and gait changes, and L gluteal pain. Patient will benefit from continued skilled therapeutic intervention to address the above deficits as needed for improved function and QoL.    Personal Factors and Comorbidities Education;Comorbidity 3+;Age;Time since onset of injury/illness/exacerbation    Comorbidities AAA, AVN, HTN, Hx of bilateral LE edema, connective tissue disorder, Type 2 DM    Examination-Activity Limitations Locomotion Level;Squat;Stairs;Stand;Transfers;Bed Mobility    Examination-Participation Restrictions Driving;Community Activity;Cleaning    Stability/Clinical Decision Making Evolving/Moderate complexity    Rehab Potential Fair     PT Frequency 2x / week    PT Duration 8 weeks    PT Treatment/Interventions ADLs/Self  Care Home Management;Electrical Stimulation;Moist Heat;Gait training;Stair training;Functional mobility training;Neuromuscular re-education;Balance training;Therapeutic exercise;Therapeutic activities;Patient/family education;Manual techniques;Passive range of motion    PT Next Visit Plan Manual therapy for desensitization and soft tissue mobility of affected gluteal musculature, gentle stretching and isometrics. Will progress with gentle gluteal activation and hip mobility work as tolerated.    PT Home Exercise Plan Access Code [email or web address]    Consulted and Agree with Plan of Care Patient             Patient will benefit from skilled therapeutic intervention in order to improve the following deficits and impairments:  Abnormal gait, Pain, Postural dysfunction, Decreased mobility, Decreased activity tolerance, Decreased endurance, Decreased range of motion, Decreased strength, Impaired flexibility, Difficulty walking  Visit Diagnosis: Pain in left hip  Difficulty in walking, not elsewhere classified  Muscle weakness (generalized)     Problem List Patient Active Problem List   Diagnosis Date Noted   Nausea 03/31/2020   Multiple thyroid nodules 12/28/2019   Thyroid nodule 09/29/2019   Lymphedema 11/10/2018   PAD (peripheral artery disease) ([place]) 10/12/2018   Aortic atherosclerosis ([place]) 10/12/2018   Carotid stenosis, asymptomatic, bilateral 10/12/2018   Positive colorectal cancer screening using Cologuard test 10/09/2018   Gastroesophageal reflux disease    Acute gastritis without hemorrhage    Osteoarthritis 04/29/2018   Hiatal hernia with GERD 04/29/2018   Hyperlipidemia 04/29/2018   Lymphedema of both lower extremities 04/29/2018   Left knee pain 10/23/2016   Cataracts, bilateral 07/31/2016   Glaucoma 07/31/2016   Lateral epicondylitis of left elbow 10/20/2015   Vitamin D deficiency  10/20/2015   Type 2 diabetes, controlled, with neuropathy ([place]) 10/16/2015   Gouty arthritis 06/03/2015   H/O syncope 06/03/2015   Mild intermittent asthma 06/03/2015   Multiple gastric ulcers 06/03/2015   Schatzki's ring 06/03/2015   Undifferentiated connective tissue disease ([place]) 06/03/2015   Bone disorder 04/05/2015   Dental injury 04/05/2015   Pericarditis 04/05/2015   Sjogren's syndrome ([place]) 09/07/2014   Elevated alkaline phosphatase level 08/24/2014   Lactose intolerance 08/24/2014   Obesity 08/24/2014   Peripheral edema 08/24/2014   Benign hypertension with CKD (chronic kidney disease) stage III 08/03/2014   Abdominal adhesions 08/02/2014   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 [place], [place], [address] Phone: [phone]   Fax:  [phone]  Name: Rondell Zervas MRN: [id number] Date of Birth: Nov 09, 1945

## 2021-01-12 ENCOUNTER — Encounter: Payer: Self-pay | Admitting: Physical Therapy

## 2021-01-16 ENCOUNTER — Other Ambulatory Visit: Payer: Self-pay

## 2021-01-16 ENCOUNTER — Ambulatory Visit: Payer: Medicare Other | Admitting: Physical Therapy

## 2021-01-16 ENCOUNTER — Encounter: Payer: Self-pay | Admitting: Physical Therapy

## 2021-01-16 DIAGNOSIS — R262 Difficulty in walking, not elsewhere classified: Secondary | ICD-10-CM

## 2021-01-16 DIAGNOSIS — M6281 Muscle weakness (generalized): Secondary | ICD-10-CM

## 2021-01-16 DIAGNOSIS — M25552 Pain in left hip: Secondary | ICD-10-CM | POA: Diagnosis not present

## 2021-01-16 NOTE — Therapy (Signed)
[facility] [address] [place], [place], [address] Phone: [phone]   Fax:  [phone]  Physical Therapy Treatment  Patient Details  Name: [name] MRN: [id number] Date of Birth: 1945/10/10 Referring Provider (PT): Rolanda Jay Mar[place], [place]   Encounter Date: 01/16/2021   PT End of Session - 01/16/21 1802     Visit Number 3    Number of Visits 17    Date for PT Re-Evaluation 03/01/21    Authorization - Visit Number 3    Progress Note Due on Visit 10    PT Start Time [id number]    PT Stop Time 1842    PT Time Calculation (min) 45 min    Activity Tolerance Patient tolerated treatment well;Patient limited by pain    Behavior During Therapy [facility] for tasks assessed/performed             Past Medical History:  Diagnosis Date   Anterolisthesis    Cervical spine   Asthma    Connective tissue disorder ([place])    recurrent carotid arteritis, temporal arteritis, vasculitis mandible, general hepatitis, avascular necrosis bilat   DDD (degenerative disc disease), lumbar    Diabetes mellitus without complication ([place])    Diverticulosis    Duodenitis    Ganglion cyst of right foot    Gouty arthritis    Hiatal hernia with GERD    History of cardiovascular stress test    a. 07/2018 [facility]): Fixed inferoapical defect w/ nl contraction-->attenuation artifact. No ischemia. EF 63%.   Hyperlipidemia    Hypertension    a. 02/2019 Renal artery duplex: no evidence of prox RAS.   Hyperuricemia    Keratitis sicca, bilateral (HCC)    Lymphedema of both lower extremities    Osteoarthritis    Pericarditis    Recurrent: Aug 97, July 05, Sept 08, July 16, July 18   PUD (peptic ulcer disease)    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[place])    Sjogren's syndrome ([place])    Syncope    Tendonitis, Achilles, right    Tortuous colon    Trigger finger     Past Surgical History:  Procedure Laterality Date   ABDOMINAL HYSTERECTOMY     BREAST BIOPSY      BREAST EXCISIONAL BIOPSY Left 1986   neg   CHOLECYSTECTOMY     COLONOSCOPY WITH PROPOFOL N/A 08/26/2018   Procedure: COLONOSCOPY WITH PROPOFOL;  Surgeon: Lucilla Lame, MD;  Location: [facility] ENDOSCOPY;  Service: Endoscopy;  Laterality: N/A;   CYSTOSCOPY  02/26/2018   [name], MD    distal arthrectomy     ESOPHAGOGASTRODUODENOSCOPY (EGD) WITH PROPOFOL N/A 08/26/2018   Procedure: ESOPHAGOGASTRODUODENOSCOPY (EGD) WITH PROPOFOL;  Surgeon: Lucilla Lame, MD;  Location: [facility] ENDOSCOPY;  Service: Endoscopy;  Laterality: N/A;   EXCISION NEUROMA     KNEE SURGERY Bilateral    1985, 2001, 11/2002, 12/2006   panhysterectomy  10/1983   SHOULDER SURGERY Right    reverse total arthroplasty w/ biceps tenodesis   TONSILLECTOMY AND ADENOIDECTOMY     TOTAL HIP ARTHROPLASTY Right 04/2007   WISDOM TOOTH EXTRACTION      There were no vitals filed for this visit.   Subjective Assessment - 01/16/21 1800     Subjective Patient reports having some difficulty with her updated home exercises. Patient reports pain is not bad at arrival to PT, 3/10. She reports having some pain in L buttock region following exercise at home. She reports she has responded well in  the past to manipulation techniques.    Pertinent History Patient is a 75 year old female with previous episode of PT in this clinic related to low back pain. Patient has current primary complaint of L hip pain. Patient reports she was going ot sit onto toilet and was going to sit and fell into sitting in uncontrolled manner with weight shifted onto her L hip (DOI: 12/08/2020). She states she had pain at the time that worsened more the following day. Patient reports history of bursitis in L hip with multiple episode since the mid 1980s. Patient reports pain affecting posteiror gluteal and L SI region. Pt has history of L AVN. Patient had radiographs this past Thursday - evidence of degenerative changes and known lumbar DDD. Patient reports 4/10 presently. Patient  reports 10/10 pain at the worst. Patient reports no pain lower than 4/10. Patient denies numbness and tingling. Patient reports lower pain if she doesn't "use it." Aggravating factors: walking, more weightbearing, stair ascent>descent.. Easing factors: resting it, "not doing anything." Hx of R rTSA, R THA, L partial knee replacement. Goal: get rid of pain. She reports pain with every step of walking.    Limitations Walking;Standing;House hold activities    Patient Stated Goals Decrease back pain/ improve strength/ mobility.    Pain Onset --   30 years             NuStep x 5 minutes for improved soft tissue extensibility/mobility prior to manual therapy and exercise; Level 2, seat 11     TREATMENT PERFORMED    Manual Therapy - for symptom modulation, soft tissue sensitivity and mobility, joint mobility, ROM   In sidelying, STM and Hypervolt along L gluteus medius and minimus Long-axis distraction, gr II for pain relief L Hip PROM within pt tolerance         Therapeutic Exercise - for improved soft tissue mobility, lumbopelvic/hip mobility, isometrics for analgesic effect   Sidelying clamshell, sidelying R; 2x10 Lower trunk rotations; x10 each direction Supine piriformis stretch; 3x30sec Hip ADD isometrics, with belt and ball; 2x10, 5 sec   Patient education: HEP update and review     *not today* Hip ABD and ADD isometrics, with belt and ball; x10, 5 sec          ASSESSMENT Patient has significant orthopedic history and requires frequent re-directing to task at hand. She frequently voices good response to manipulative therapy and chiropractic care in the past. Her symptoms are primarily L deep gluteal versus lumbosacral with negative provocative tests for discogenic referral to gluteal region. Patient tolerates the session well today, but she does have delayed onset of discomfort after exercise performed historically and has notable pain in the evening. Will assess  patient's response, sleep quality, and remaining impairments/pain status with each successive visit. Patient has remaining deficits in hip strength, functional mobility, LLE weightbearing tolerance and gait changes, and L gluteal pain. Patient will benefit from continued skilled therapeutic intervention to address the above deficits as needed for improved function and QoL.        PT Short Term Goals - 01/05/21 1655       PT SHORT TERM GOAL #1   Title Pt will be independent and 100% compliant with established HEP and activity modification as needed to augment PT intervention and prevent flare-up of hip pain as needed for best return to prior level of function.    Baseline 01/04/21: discussed palliative care and activity modification; initiating HEP next visit    Time 3  Period Weeks    Status New    Target Date 01/26/21               PT Long Term Goals - 01/04/21 1909       PT LONG TERM GOAL #1   Title Patient will demonstrate improved function as evidenced by a score of 47 on FOTO measure for full participation in activities at home and in the community.    Baseline 01/04/21: FOTO 27    Time 8    Period Weeks    Status New    Target Date 03/01/21      PT LONG TERM GOAL #2   Title Patient will have hip strength to 4+/5 or greater for all motions tested without reproduction of pain indicative of improved capacity for loading paraspinal/pelvic and gluteal mm as needed for performance of transferring, stair negotiation    Baseline 01/04/21: 4/5 hip flexors and abduction(seated), 4/5 quad and HS, weak hip ER,    Time 8    Period Weeks    Status New    Target Date 03/01/21      PT LONG TERM GOAL #3   Title Patient will perform stair negotiaiton in clinic, ascending and descending 4 steps with unilateral handrail support and no loss of balance or buckling independently without net increase in hip/back pain as needed for accessing home and community    Baseline 01/04/21: Significant  pain with stair negotiation    Time 8    Period Weeks    Status New    Target Date 03/01/21      PT LONG TERM GOAL #4   Title Patient will have no increase in pain with functional hip ROM in all planes as needed for lower limb management during self-care tasks, car transfers, and bed mobility tasks    Baseline 01/04/21: Pain with end-range hip ER and IR    Time 6    Period Weeks    Status New    Target Date 02/15/21      PT LONG TERM GOAL #5   Title Patient will perform independent sit to stand from 19-20 inch chair with no UE support and no reproduction of hip pain indicative of improved ability to perform transferring    Baseline 01/04/21: difficulty and pain with sit to stand    Time 6    Period Weeks    Status New    Target Date 02/15/21                   Plan - 01/17/21 1101     Clinical Impression Statement Patient has significant orthopedic history and requires frequent re-directing to task at hand. She frequently voices good response to manipulative therapy and chiropractic care in the past. Her symptoms are primarily L deep gluteal versus lumbosacral with negative provocative tests for discogenic referral to gluteal region. Patient tolerates the session well today, but she does have delayed onset of discomfort after exercise performed historically and has notable pain in the evening. Will assess patient's response, sleep quality, and remaining impairments/pain status with each successive visit. Patient has remaining deficits in hip strength, functional mobility, LLE weightbearing tolerance and gait changes, and L gluteal pain. Patient will benefit from continued skilled therapeutic intervention to address the above deficits as needed for improved function and QoL.    Personal Factors and Comorbidities Education;Comorbidity 3+;Age;Time since onset of injury/illness/exacerbation    Comorbidities AAA, AVN, HTN, Hx of bilateral LE edema, connective tissue disorder,  Type 2 DM     Examination-Activity Limitations Locomotion Level;Squat;Stairs;Stand;Transfers;Bed Mobility    Examination-Participation Restrictions Driving;Community Activity;Cleaning    Stability/Clinical Decision Making Evolving/Moderate complexity    Rehab Potential Fair    PT Frequency 2x / week    PT Duration 8 weeks    PT Treatment/Interventions ADLs/Self Care Home Management;Electrical Stimulation;Moist Heat;Gait training;Stair training;Functional mobility training;Neuromuscular re-education;Balance training;Therapeutic exercise;Therapeutic activities;Patient/family education;Manual techniques;Passive range of motion    PT Next Visit Plan Manual therapy for desensitization and soft tissue mobility of affected gluteal musculature, gentle stretching and isometrics. Will progress with gentle gluteal activation and hip mobility work as tolerated.    PT Home Exercise Plan Access Code [id number]    Consulted and Agree with Plan of Care Patient             Patient will benefit from skilled therapeutic intervention in order to improve the following deficits and impairments:  Abnormal gait, Pain, Postural dysfunction, Decreased mobility, Decreased activity tolerance, Decreased endurance, Decreased range of motion, Decreased strength, Impaired flexibility, Difficulty walking  Visit Diagnosis: Pain in left hip  Difficulty in walking, not elsewhere classified  Muscle weakness (generalized)     Problem List Patient Active Problem List   Diagnosis Date Noted   Nausea 03/31/2020   Multiple thyroid nodules 12/28/2019   Thyroid nodule 09/29/2019   Lymphedema 11/10/2018   PAD (peripheral artery disease) ([place]) 10/12/2018   Aortic atherosclerosis ([place]) 10/12/2018   Carotid stenosis, asymptomatic, bilateral 10/12/2018   Positive colorectal cancer screening using Cologuard test 10/09/2018   Gastroesophageal reflux disease    Acute gastritis without hemorrhage    Osteoarthritis 04/29/2018   Hiatal  hernia with GERD 04/29/2018   Hyperlipidemia 04/29/2018   Lymphedema of both lower extremities 04/29/2018   Left knee pain 10/23/2016   Cataracts, bilateral 07/31/2016   Glaucoma 07/31/2016   Lateral epicondylitis of left elbow 10/20/2015   Vitamin D deficiency 10/20/2015   Type 2 diabetes, controlled, with neuropathy ([place]) 10/16/2015   Gouty arthritis 06/03/2015   H/O syncope 06/03/2015   Mild intermittent asthma 06/03/2015   Multiple gastric ulcers 06/03/2015   Schatzki's ring 06/03/2015   Undifferentiated connective tissue disease ([place]) 06/03/2015   Bone disorder 04/05/2015   Dental injury 04/05/2015   Pericarditis 04/05/2015   Sjogren's syndrome ([place]) 09/07/2014   Elevated alkaline phosphatase level 08/24/2014   Lactose intolerance 08/24/2014   Obesity 08/24/2014   Peripheral edema 08/24/2014   Benign hypertension with CKD (chronic kidney disease) stage III 08/03/2014   Abdominal adhesions 08/02/2014   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 [place], [place], [address] Phone: [phone]   Fax:  [phone]  Name: Beya Zillmer MRN: [id number] Date of Birth: Aug 03, 1945

## 2021-01-17 ENCOUNTER — Encounter: Payer: Medicare Other | Admitting: Physical Therapy

## 2021-01-18 ENCOUNTER — Other Ambulatory Visit: Payer: Self-pay

## 2021-01-18 ENCOUNTER — Ambulatory Visit: Payer: Medicare Other | Admitting: Physical Therapy

## 2021-01-18 ENCOUNTER — Encounter: Payer: Self-pay | Admitting: Physical Therapy

## 2021-01-18 DIAGNOSIS — R262 Difficulty in walking, not elsewhere classified: Secondary | ICD-10-CM

## 2021-01-18 DIAGNOSIS — M25552 Pain in left hip: Secondary | ICD-10-CM

## 2021-01-18 DIAGNOSIS — M6281 Muscle weakness (generalized): Secondary | ICD-10-CM

## 2021-01-18 NOTE — Therapy (Signed)
[facility] [address] [place], [place], [address] Phone: [phone]   Fax:  [phone]  Physical Therapy Treatment  Patient Details  Name: [name] MRN: [id number] Date of Birth: 1945/06/17 Referring Provider (PT): Rolanda Jay [place], [place]   Encounter Date: 01/18/2021   PT End of Session - 01/18/21 1724     Visit Number 4    Number of Visits 17    Date for PT Re-Evaluation 03/01/21    Authorization - Visit Number 3    Progress Note Due on Visit 10    PT Start Time [id number]    PT Stop Time 1800    PT Time Calculation (min) 43 min    Activity Tolerance Patient tolerated treatment well;Patient limited by pain    Behavior During Therapy [facility] for tasks assessed/performed             Past Medical History:  Diagnosis Date   Anterolisthesis    Cervical spine   Asthma    Connective tissue disorder ([place])    recurrent carotid arteritis, temporal arteritis, vasculitis mandible, general hepatitis, avascular necrosis bilat   DDD (degenerative disc disease), lumbar    Diabetes mellitus without complication ([place])    Diverticulosis    Duodenitis    Ganglion cyst of right foot    Gouty arthritis    Hiatal hernia with GERD    History of cardiovascular stress test    a. 07/2018 [facility]): Fixed inferoapical defect w/ nl contraction-->attenuation artifact. No ischemia. EF 63%.   Hyperlipidemia    Hypertension    a. 02/2019 Renal artery duplex: no evidence of prox RAS.   Hyperuricemia    Keratitis sicca, bilateral (HCC)    Lymphedema of both lower extremities    Osteoarthritis    Pericarditis    Recurrent: Aug 97, July 05, Sept 08, July 16, July 18   PUD (peptic ulcer disease)    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[place])    Sjogren's syndrome ([place])    Syncope    Tendonitis, Achilles, right    Tortuous colon    Trigger finger     Past Surgical History:  Procedure Laterality Date   ABDOMINAL HYSTERECTOMY     BREAST BIOPSY      BREAST EXCISIONAL BIOPSY Left 1986   neg   CHOLECYSTECTOMY     COLONOSCOPY WITH PROPOFOL N/A 08/26/2018   Procedure: COLONOSCOPY WITH PROPOFOL;  Surgeon: Lucilla Lame, MD;  Location: [facility] ENDOSCOPY;  Service: Endoscopy;  Laterality: N/A;   CYSTOSCOPY  02/26/2018   [name], MD    distal arthrectomy     ESOPHAGOGASTRODUODENOSCOPY (EGD) WITH PROPOFOL N/A 08/26/2018   Procedure: ESOPHAGOGASTRODUODENOSCOPY (EGD) WITH PROPOFOL;  Surgeon: Lucilla Lame, MD;  Location: [facility] ENDOSCOPY;  Service: Endoscopy;  Laterality: N/A;   EXCISION NEUROMA     KNEE SURGERY Bilateral    1985, 2001, 11/2002, 12/2006   panhysterectomy  10/1983   SHOULDER SURGERY Right    reverse total arthroplasty w/ biceps tenodesis   TONSILLECTOMY AND ADENOIDECTOMY     TOTAL HIP ARTHROPLASTY Right 04/2007   WISDOM TOOTH EXTRACTION      There were no vitals filed for this visit.   Subjective Assessment - 01/18/21 1722     Subjective Pt reports minimal pain at arrival to PT. Patient reports pain in L buttock mainly at night and when turning over in bed. Patient reports doing okay during her last session, but she feels that her pain in the evening still  occurred.    Pertinent History Patient is a 75 year old female with previous episode of PT in this clinic related to low back pain. Patient has current primary complaint of L hip pain. Patient reports she was going ot sit onto toilet and was going to sit and fell into sitting in uncontrolled manner with weight shifted onto her L hip (DOI: 12/08/2020). She states she had pain at the time that worsened more the following day. Patient reports history of bursitis in L hip with multiple episode since the mid 1980s. Patient reports pain affecting posteiror gluteal and L SI region. Pt has history of L AVN. Patient had radiographs this past Thursday - evidence of degenerative changes and known lumbar DDD. Patient reports 4/10 presently. Patient reports 10/10 pain at the worst. Patient  reports no pain lower than 4/10. Patient denies numbness and tingling. Patient reports lower pain if she doesn't "use it." Aggravating factors: walking, more weightbearing, stair ascent>descent.. Easing factors: resting it, "not doing anything." Hx of R rTSA, R THA, L partial knee replacement. Goal: get rid of pain. She reports pain with every step of walking.    Limitations Walking;Standing;House hold activities    Patient Stated Goals Decrease back pain/ improve strength/ mobility.    Pain Onset --   30 years                NuStep x 5 minutes for improved soft tissue extensibility/mobility prior to manual therapy and exercise; Level 2, seat 11 - unbilled time       TREATMENT PERFORMED     Manual Therapy - for symptom modulation, soft tissue sensitivity and mobility, joint mobility, ROM   In sidelying, STM and Hypervolt along L gluteus medius and minimus DTM/Tpr at gluteus medius Long-axis distraction, gr II for pain relief L Hip PROM within pt tolerance         Therapeutic Exercise - for improved soft tissue mobility, lumbopelvic/hip mobility, isometrics for analgesic effect   Sidelying clamshell, sidelying R; 2x10 Lower trunk rotations; x10 each direction Supine piriformis stretch; 3x30sec Supine Bridge; 2x10     *not today* Hip ADD isometrics, with belt and ball; 2x10, 5 sec Hip ABD and ADD isometrics, with belt and ball; x10, 5 sec           ASSESSMENT Patient does require frequent re-orienting to task at hand, but she participates well with PT and has significant tautness and sensitivity along L gluteus medius without notable referral pattern. She tolerates soft tissue work well acutely, but she reports feeling more sore when initially going to walk after full treatment was performed in lying; this is mitigated with successive steps in clinic. Pt is able to perform functional bridge well without notable c/o increased pain as needed for bed mobility. Will assess  patient's response, sleep quality, and remaining impairments/pain status with each successive visit. Patient has remaining deficits in hip strength, functional mobility, LLE weightbearing tolerance and gait changes, and L gluteal pain. Patient will benefit from continued skilled therapeutic intervention to address the above deficits as needed for improved function and QoL.      PT Short Term Goals - 01/05/21 1655       PT SHORT TERM GOAL #1   Title Pt will be independent and 100% compliant with established HEP and activity modification as needed to augment PT intervention and prevent flare-up of hip pain as needed for best return to prior level of function.    Baseline 01/04/21: discussed palliative care and  activity modification; initiating HEP next visit    Time 3    Period Weeks    Status New    Target Date 01/26/21               PT Long Term Goals - 01/04/21 1909       PT LONG TERM GOAL #1   Title Patient will demonstrate improved function as evidenced by a score of 47 on FOTO measure for full participation in activities at home and in the community.    Baseline 01/04/21: FOTO 27    Time 8    Period Weeks    Status New    Target Date 03/01/21      PT LONG TERM GOAL #2   Title Patient will have hip strength to 4+/5 or greater for all motions tested without reproduction of pain indicative of improved capacity for loading paraspinal/pelvic and gluteal mm as needed for performance of transferring, stair negotiation    Baseline 01/04/21: 4/5 hip flexors and abduction(seated), 4/5 quad and HS, weak hip ER,    Time 8    Period Weeks    Status New    Target Date 03/01/21      PT LONG TERM GOAL #3   Title Patient will perform stair negotiaiton in clinic, ascending and descending 4 steps with unilateral handrail support and no loss of balance or buckling independently without net increase in hip/back pain as needed for accessing home and community    Baseline 01/04/21: Significant pain  with stair negotiation    Time 8    Period Weeks    Status New    Target Date 03/01/21      PT LONG TERM GOAL #4   Title Patient will have no increase in pain with functional hip ROM in all planes as needed for lower limb management during self-care tasks, car transfers, and bed mobility tasks    Baseline 01/04/21: Pain with end-range hip ER and IR    Time 6    Period Weeks    Status New    Target Date 02/15/21      PT LONG TERM GOAL #5   Title Patient will perform independent sit to stand from 19-20 inch chair with no UE support and no reproduction of hip pain indicative of improved ability to perform transferring    Baseline 01/04/21: difficulty and pain with sit to stand    Time 6    Period Weeks    Status New    Target Date 02/15/21                   Plan - 01/19/21 1417     Clinical Impression Statement Patient does require frequent re-orienting to task at hand, but she participates well with PT and has significant tautness and sensitivity along L gluteus medius without notable referral pattern. She tolerates soft tissue work well acutely, but she reports feeling more sore when initially going to walk after full treatment was performed in lying; this is mitigated with successive steps in clinic. Pt is able to perform functional bridge well without notable c/o increased pain as needed for bed mobility. Will assess patient's response, sleep quality, and remaining impairments/pain status with each successive visit. Patient has remaining deficits in hip strength, functional mobility, LLE weightbearing tolerance and gait changes, and L gluteal pain. Patient will benefit from continued skilled therapeutic intervention to address the above deficits as needed for improved function and QoL.    Personal Factors  and Comorbidities Education;Comorbidity 3+;Age;Time since onset of injury/illness/exacerbation    Comorbidities AAA, AVN, HTN, Hx of bilateral LE edema, connective tissue  disorder, Type 2 DM    Examination-Activity Limitations Locomotion Level;Squat;Stairs;Stand;Transfers;Bed Mobility    Examination-Participation Restrictions Driving;Community Activity;Cleaning    Stability/Clinical Decision Making Evolving/Moderate complexity    Rehab Potential Fair    PT Frequency 2x / week    PT Duration 8 weeks    PT Treatment/Interventions ADLs/Self Care Home Management;Electrical Stimulation;Moist Heat;Gait training;Stair training;Functional mobility training;Neuromuscular re-education;Balance training;Therapeutic exercise;Therapeutic activities;Patient/family education;Manual techniques;Passive range of motion    PT Next Visit Plan Manual therapy for desensitization and soft tissue mobility of affected gluteal musculature, gentle stretching and isometrics. Will progress with gentle gluteal activation and hip mobility work as tolerated.    PT Home Exercise Plan Access Code [id number]    Consulted and Agree with Plan of Care Patient             Patient will benefit from skilled therapeutic intervention in order to improve the following deficits and impairments:  Abnormal gait, Pain, Postural dysfunction, Decreased mobility, Decreased activity tolerance, Decreased endurance, Decreased range of motion, Decreased strength, Impaired flexibility, Difficulty walking  Visit Diagnosis: Pain in left hip  Difficulty in walking, not elsewhere classified  Muscle weakness (generalized)     Problem List Patient Active Problem List   Diagnosis Date Noted   Nausea 03/31/2020   Multiple thyroid nodules 12/28/2019   Thyroid nodule 09/29/2019   Lymphedema 11/10/2018   PAD (peripheral artery disease) ([place]) 10/12/2018   Aortic atherosclerosis ([place]) 10/12/2018   Carotid stenosis, asymptomatic, bilateral 10/12/2018   Positive colorectal cancer screening using Cologuard test 10/09/2018   Gastroesophageal reflux disease    Acute gastritis without hemorrhage    Osteoarthritis  04/29/2018   Hiatal hernia with GERD 04/29/2018   Hyperlipidemia 04/29/2018   Lymphedema of both lower extremities 04/29/2018   Left knee pain 10/23/2016   Cataracts, bilateral 07/31/2016   Glaucoma 07/31/2016   Lateral epicondylitis of left elbow 10/20/2015   Vitamin D deficiency 10/20/2015   Type 2 diabetes, controlled, with neuropathy ([place]) 10/16/2015   Gouty arthritis 06/03/2015   H/O syncope 06/03/2015   Mild intermittent asthma 06/03/2015   Multiple gastric ulcers 06/03/2015   Schatzki's ring 06/03/2015   Undifferentiated connective tissue disease ([place]) 06/03/2015   Bone disorder 04/05/2015   Dental injury 04/05/2015   Pericarditis 04/05/2015   Sjogren's syndrome ([place]) 09/07/2014   Elevated alkaline phosphatase level 08/24/2014   Lactose intolerance 08/24/2014   Obesity 08/24/2014   Peripheral edema 08/24/2014   Benign hypertension with CKD (chronic kidney disease) stage III 08/03/2014   Abdominal adhesions 08/02/2014   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 [place], [place], [address] Phone: [phone]   Fax:  [phone]  Name: [name] MRN: [id number] Date of Birth: 09-17-1945

## 2021-01-19 ENCOUNTER — Encounter: Payer: Medicare Other | Admitting: Physical Therapy

## 2021-01-23 ENCOUNTER — Ambulatory Visit: Payer: Medicare Other | Admitting: Physical Therapy

## 2021-01-24 ENCOUNTER — Encounter: Payer: Medicare Other | Admitting: Physical Therapy

## 2021-01-25 ENCOUNTER — Other Ambulatory Visit: Payer: Self-pay

## 2021-01-25 ENCOUNTER — Ambulatory Visit: Payer: Medicare Other | Admitting: Physical Therapy

## 2021-01-25 DIAGNOSIS — M25552 Pain in left hip: Secondary | ICD-10-CM

## 2021-01-25 DIAGNOSIS — M6281 Muscle weakness (generalized): Secondary | ICD-10-CM

## 2021-01-25 DIAGNOSIS — R262 Difficulty in walking, not elsewhere classified: Secondary | ICD-10-CM

## 2021-01-25 NOTE — Therapy (Signed)
[facility] [address] [place], [place], [address] Phone: [phone]   Fax:  [phone]  Physical Therapy Treatment  Patient Details  Name: [name] MRN: [id number] Date of Birth: 02-18-1946 Referring Provider (PT): Rolanda Jay [place], [place]   Encounter Date: 01/25/2021   PT End of Session - 01/27/21 0957     Visit Number 5    Number of Visits 17    Date for PT Re-Evaluation 03/01/21    Authorization - Visit Number 3    Progress Note Due on Visit 10    PT Start Time [id number]    PT Stop Time 1800    PT Time Calculation (min) 43 min    Activity Tolerance Patient tolerated treatment well;Patient limited by pain    Behavior During Therapy [facility] for tasks assessed/performed             Past Medical History:  Diagnosis Date   Anterolisthesis    Cervical spine   Asthma    Connective tissue disorder ([place])    recurrent carotid arteritis, temporal arteritis, vasculitis mandible, general hepatitis, avascular necrosis bilat   DDD (degenerative disc disease), lumbar    Diabetes mellitus without complication ([place])    Diverticulosis    Duodenitis    Ganglion cyst of right foot    Gouty arthritis    Hiatal hernia with GERD    History of cardiovascular stress test    a. 07/2018 [facility]): Fixed inferoapical defect w/ nl contraction-->attenuation artifact. No ischemia. EF 63%.   Hyperlipidemia    Hypertension    a. 02/2019 Renal artery duplex: no evidence of prox RAS.   Hyperuricemia    Keratitis sicca, bilateral (HCC)    Lymphedema of both lower extremities    Osteoarthritis    Pericarditis    Recurrent: Aug 97, July 05, Sept 08, July 16, July 18   PUD (peptic ulcer disease)    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[place])    Sjogren's syndrome ([place])    Syncope    Tendonitis, Achilles, right    Tortuous colon    Trigger finger     Past Surgical History:  Procedure Laterality Date   ABDOMINAL HYSTERECTOMY     BREAST BIOPSY      BREAST EXCISIONAL BIOPSY Left 1986   neg   CHOLECYSTECTOMY     COLONOSCOPY WITH PROPOFOL N/A 08/26/2018   Procedure: COLONOSCOPY WITH PROPOFOL;  Surgeon: Lucilla Lame, MD;  Location: [facility] ENDOSCOPY;  Service: Endoscopy;  Laterality: N/A;   CYSTOSCOPY  02/26/2018   [name], MD    distal arthrectomy     ESOPHAGOGASTRODUODENOSCOPY (EGD) WITH PROPOFOL N/A 08/26/2018   Procedure: ESOPHAGOGASTRODUODENOSCOPY (EGD) WITH PROPOFOL;  Surgeon: Lucilla Lame, MD;  Location: [facility] ENDOSCOPY;  Service: Endoscopy;  Laterality: N/A;   EXCISION NEUROMA     KNEE SURGERY Bilateral    1985, 2001, 11/2002, 12/2006   panhysterectomy  10/1983   SHOULDER SURGERY Right    reverse total arthroplasty w/ biceps tenodesis   TONSILLECTOMY AND ADENOIDECTOMY     TOTAL HIP ARTHROPLASTY Right 04/2007   WISDOM TOOTH EXTRACTION      There were no vitals filed for this visit.   Subjective Assessment - 01/27/21 0957     Subjective Patient had to miss visit earlier this week due to being in court. She reports feeling better after last visit the following day, though it was very sore that evening. She reports missing her exercises some with court the other day.  Pertinent History Patient is a 75 year old female with previous episode of PT in this clinic related to low back pain. Patient has current primary complaint of L hip pain. Patient reports she was going ot sit onto toilet and was going to sit and fell into sitting in uncontrolled manner with weight shifted onto her L hip (DOI: 12/08/2020). She states she had pain at the time that worsened more the following day. Patient reports history of bursitis in L hip with multiple episode since the mid 1980s. Patient reports pain affecting posteiror gluteal and L SI region. Pt has history of L AVN. Patient had radiographs this past Thursday - evidence of degenerative changes and known lumbar DDD. Patient reports 4/10 presently. Patient reports 10/10 pain at the worst. Patient  reports no pain lower than 4/10. Patient denies numbness and tingling. Patient reports lower pain if she doesn't "use it." Aggravating factors: walking, more weightbearing, stair ascent>descent.. Easing factors: resting it, "not doing anything." Hx of R rTSA, R THA, L partial knee replacement. Goal: get rid of pain. She reports pain with every step of walking.    Limitations Walking;Standing;House hold activities    Patient Stated Goals Decrease back pain/ improve strength/ mobility.    Pain Onset --   30 years             NuStep x 5 minutes for improved soft tissue extensibility/mobility prior to manual therapy and exercise; Level 2, seat 11 - unbilled time       TREATMENT PERFORMED     Manual Therapy - for symptom modulation, soft tissue sensitivity and mobility, joint mobility, ROM   In sidelying, STM and Hypervolt along L gluteus medius and minimus DTM/Tpr at gluteus medius Long-axis distraction, gr II for pain relief L Hip PROM within pt tolerance         Therapeutic Exercise - for improved soft tissue mobility, lumbopelvic/hip mobility, isometrics for analgesic effect   Sidelying clamshell, sidelying R; 2x10 with light manual resistance Lower trunk rotations; x10 each direction Supine Bridge with Red Tband Abduction; 2x10 Sit to stand, raised table; 2x8   *not today* Supine piriformis stretch; 3x30sec Hip ADD isometrics, with belt and ball; 2x10, 5 sec Hip ABD and ADD isometrics, with belt and ball; x10, 5 sec           ASSESSMENT Patient reports favorable response to intervention this week with improved pain in L posterolateral gluteal region. She is able to moderately progress with hip abductor and external rotator isotonics without notable complaint of increased pain. She is able to perform functional bridge and sit to stand without increase in pain. Patient has remaining deficits in hip strength, functional mobility, LLE weightbearing tolerance and gait changes,  and L gluteal pain. Patient will benefit from continued skilled therapeutic intervention to address the above deficits as needed for improved function and QoL.         PT Short Term Goals - 01/05/21 1655       PT SHORT TERM GOAL #1   Title Pt will be independent and 100% compliant with established HEP and activity modification as needed to augment PT intervention and prevent flare-up of hip pain as needed for best return to prior level of function.    Baseline 01/04/21: discussed palliative care and activity modification; initiating HEP next visit    Time 3    Period Weeks    Status New    Target Date 01/26/21  PT Long Term Goals - 01/04/21 1909       PT LONG TERM GOAL #1   Title Patient will demonstrate improved function as evidenced by a score of 47 on FOTO measure for full participation in activities at home and in the community.    Baseline 01/04/21: FOTO 27    Time 8    Period Weeks    Status New    Target Date 03/01/21      PT LONG TERM GOAL #2   Title Patient will have hip strength to 4+/5 or greater for all motions tested without reproduction of pain indicative of improved capacity for loading paraspinal/pelvic and gluteal mm as needed for performance of transferring, stair negotiation    Baseline 01/04/21: 4/5 hip flexors and abduction(seated), 4/5 quad and HS, weak hip ER,    Time 8    Period Weeks    Status New    Target Date 03/01/21      PT LONG TERM GOAL #3   Title Patient will perform stair negotiaiton in clinic, ascending and descending 4 steps with unilateral handrail support and no loss of balance or buckling independently without net increase in hip/back pain as needed for accessing home and community    Baseline 01/04/21: Significant pain with stair negotiation    Time 8    Period Weeks    Status New    Target Date 03/01/21      PT LONG TERM GOAL #4   Title Patient will have no increase in pain with functional hip ROM in all planes as  needed for lower limb management during self-care tasks, car transfers, and bed mobility tasks    Baseline 01/04/21: Pain with end-range hip ER and IR    Time 6    Period Weeks    Status New    Target Date 02/15/21      PT LONG TERM GOAL #5   Title Patient will perform independent sit to stand from 19-20 inch chair with no UE support and no reproduction of hip pain indicative of improved ability to perform transferring    Baseline 01/04/21: difficulty and pain with sit to stand    Time 6    Period Weeks    Status New    Target Date 02/15/21                   Plan - 01/27/21 1001     Clinical Impression Statement Patient reports favorable response to intervention this week with improved pain in L posterolateral gluteal region. She is able to moderately progress with hip abductor and external rotator isotonics without notable complaint of increased pain. She is able to perform functional bridge and sit to stand without increase in pain. Patient has remaining deficits in hip strength, functional mobility, LLE weightbearing tolerance and gait changes, and L gluteal pain. Patient will benefit from continued skilled therapeutic intervention to address the above deficits as needed for improved function and QoL.    Personal Factors and Comorbidities Education;Comorbidity 3+;Age;Time since onset of injury/illness/exacerbation    Comorbidities AAA, AVN, HTN, Hx of bilateral LE edema, connective tissue disorder, Type 2 DM    Examination-Activity Limitations Locomotion Level;Squat;Stairs;Stand;Transfers;Bed Mobility    Examination-Participation Restrictions Driving;Community Activity;Cleaning    Stability/Clinical Decision Making Evolving/Moderate complexity    Rehab Potential Fair    PT Frequency 2x / week    PT Duration 8 weeks    PT Treatment/Interventions ADLs/Self Care Home Management;Electrical Stimulation;Moist Heat;Gait training;Stair training;Functional mobility training;Neuromuscular  re-education;Balance training;Therapeutic exercise;Therapeutic activities;Patient/family education;Manual techniques;Passive range of motion    PT Next Visit Plan Manual therapy for desensitization and soft tissue mobility of affected gluteal musculature, gentle stretching and isometrics. Will progress with gentle gluteal activation and hip mobility work as tolerated.    PT Home Exercise Plan Access Code [id number]    Consulted and Agree with Plan of Care Patient             Patient will benefit from skilled therapeutic intervention in order to improve the following deficits and impairments:  Abnormal gait, Pain, Postural dysfunction, Decreased mobility, Decreased activity tolerance, Decreased endurance, Decreased range of motion, Decreased strength, Impaired flexibility, Difficulty walking  Visit Diagnosis: Pain in left hip  Difficulty in walking, not elsewhere classified  Muscle weakness (generalized)     Problem List Patient Active Problem List   Diagnosis Date Noted   Nausea 03/31/2020   Multiple thyroid nodules 12/28/2019   Thyroid nodule 09/29/2019   Lymphedema 11/10/2018   PAD (peripheral artery disease) ([place]) 10/12/2018   Aortic atherosclerosis ([place]) 10/12/2018   Carotid stenosis, asymptomatic, bilateral 10/12/2018   Positive colorectal cancer screening using Cologuard test 10/09/2018   Gastroesophageal reflux disease    Acute gastritis without hemorrhage    Osteoarthritis 04/29/2018   Hiatal hernia with GERD 04/29/2018   Hyperlipidemia 04/29/2018   Lymphedema of both lower extremities 04/29/2018   Left knee pain 10/23/2016   Cataracts, bilateral 07/31/2016   Glaucoma 07/31/2016   Lateral epicondylitis of left elbow 10/20/2015   Vitamin D deficiency 10/20/2015   Type 2 diabetes, controlled, with neuropathy ([place]) 10/16/2015   Gouty arthritis 06/03/2015   H/O syncope 06/03/2015   Mild intermittent asthma 06/03/2015   Multiple gastric ulcers 06/03/2015    Schatzki's ring 06/03/2015   Undifferentiated connective tissue disease ([place]) 06/03/2015   Bone disorder 04/05/2015   Dental injury 04/05/2015   Pericarditis 04/05/2015   Sjogren's syndrome ([place]) 09/07/2014   Elevated alkaline phosphatase level 08/24/2014   Lactose intolerance 08/24/2014   Obesity 08/24/2014   Peripheral edema 08/24/2014   Benign hypertension with CKD (chronic kidney disease) stage III 08/03/2014   Abdominal adhesions 08/02/2014   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 [place], [place], [address] Phone: [phone]   Fax:  [phone]  Name: [name] MRN: [id number] Date of Birth: [birth year]

## 2021-01-26 ENCOUNTER — Encounter: Payer: Medicare Other | Admitting: Physical Therapy

## 2021-01-27 ENCOUNTER — Encounter: Payer: Self-pay | Admitting: Physical Therapy

## 2021-01-30 ENCOUNTER — Ambulatory Visit: Payer: Medicare Other | Admitting: Physical Therapy

## 2021-01-30 ENCOUNTER — Other Ambulatory Visit: Payer: Self-pay

## 2021-01-30 VITALS — BP 149/77

## 2021-01-30 DIAGNOSIS — M6281 Muscle weakness (generalized): Secondary | ICD-10-CM

## 2021-01-30 DIAGNOSIS — M25552 Pain in left hip: Secondary | ICD-10-CM

## 2021-01-30 DIAGNOSIS — R262 Difficulty in walking, not elsewhere classified: Secondary | ICD-10-CM

## 2021-01-30 NOTE — Therapy (Signed)
[facility] [address] [place], [place], [address] Phone: [phone]   Fax:  [phone]  Physical Therapy Treatment  Patient Details  Name: [name] MRN: [id number] Date of Birth: 05-06-1946 Referring Provider (PT): Rolanda Jay [place], [place]   Encounter Date: 01/30/2021   PT End of Session - 01/30/21 1731     Visit Number 6    Number of Visits 17    Date for PT Re-Evaluation 03/01/21    Progress Note Due on Visit 10    PT Start Time 1718    PT Stop Time 1800    PT Time Calculation (min) 42 min    Activity Tolerance Patient tolerated treatment well;Patient limited by pain    Behavior During Therapy [facility] for tasks assessed/performed             Past Medical History:  Diagnosis Date   Anterolisthesis    Cervical spine   Asthma    Connective tissue disorder ([place])    recurrent carotid arteritis, temporal arteritis, vasculitis mandible, general hepatitis, avascular necrosis bilat   DDD (degenerative disc disease), lumbar    Diabetes mellitus without complication (HCC)    Diverticulosis    Duodenitis    Ganglion cyst of right foot    Gouty arthritis    Hiatal hernia with GERD    History of cardiovascular stress test    a. 07/2018 [facility]): Fixed inferoapical defect w/ nl contraction-->attenuation artifact. No ischemia. EF 63%.   Hyperlipidemia    Hypertension    a. 02/2019 Renal artery duplex: no evidence of prox RAS.   Hyperuricemia    Keratitis sicca, bilateral (HCC)    Lymphedema of both lower extremities    Osteoarthritis    Pericarditis    Recurrent: Aug 97, July 05, Sept 08, July 16, July 18   PUD (peptic ulcer disease)    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[place])    Sjogren's syndrome ([place])    Syncope    Tendonitis, Achilles, right    Tortuous colon    Trigger finger     Past Surgical History:  Procedure Laterality Date   ABDOMINAL HYSTERECTOMY     BREAST BIOPSY     BREAST EXCISIONAL BIOPSY Left  1986   neg   CHOLECYSTECTOMY     COLONOSCOPY WITH PROPOFOL N/A 08/26/2018   Procedure: COLONOSCOPY WITH PROPOFOL;  Surgeon: Lucilla Lame, MD;  Location: [facility] ENDOSCOPY;  Service: Endoscopy;  Laterality: N/A;   CYSTOSCOPY  02/26/2018   [name], MD    distal arthrectomy     ESOPHAGOGASTRODUODENOSCOPY (EGD) WITH PROPOFOL N/A 08/26/2018   Procedure: ESOPHAGOGASTRODUODENOSCOPY (EGD) WITH PROPOFOL;  Surgeon: Lucilla Lame, MD;  Location: [facility] ENDOSCOPY;  Service: Endoscopy;  Laterality: N/A;   EXCISION NEUROMA     KNEE SURGERY Bilateral    1985, 2001, 11/2002, 12/2006   panhysterectomy  10/1983   SHOULDER SURGERY Right    reverse total arthroplasty w/ biceps tenodesis   TONSILLECTOMY AND ADENOIDECTOMY     TOTAL HIP ARTHROPLASTY Right 04/2007   WISDOM TOOTH EXTRACTION      Vitals:   01/30/21 1722 01/30/21 1758  BP: (!) 162/69 (!) 149/77     Subjective Assessment - 01/30/21 1728     Subjective Patient reports mild pain at arrival to PT. Patient reports more pain with lying at night in particular. Patient reports doing okay after last visit. Patient reports using pillows to offload painful regions at home. She reports recent difficulty with blood pressure  control with SBP intermittently > 200 mmHg.    Pertinent History Patient is a 75 year old female with previous episode of PT in this clinic related to low back pain. Patient has current primary complaint of L hip pain. Patient reports she was going ot sit onto toilet and was going to sit and fell into sitting in uncontrolled manner with weight shifted onto her L hip (DOI: 12/08/2020). She states she had pain at the time that worsened more the following day. Patient reports history of bursitis in L hip with multiple episode since the mid 1980s. Patient reports pain affecting posteiror gluteal and L SI region. Pt has history of L AVN. Patient had radiographs this past Thursday - evidence of degenerative changes and known lumbar DDD. Patient  reports 4/10 presently. Patient reports 10/10 pain at the worst. Patient reports no pain lower than 4/10. Patient denies numbness and tingling. Patient reports lower pain if she doesn't "use it." Aggravating factors: walking, more weightbearing, stair ascent>descent.. Easing factors: resting it, "not doing anything." Hx of R rTSA, R THA, L partial knee replacement. Goal: get rid of pain. She reports pain with every step of walking.    Limitations Walking;Standing;House hold activities    Patient Stated Goals Decrease back pain/ improve strength/ mobility.    Pain Onset --   30 years             NuStep x 5 minutes for improved soft tissue extensibility/mobility prior to manual therapy and exercise; Level 1, seat 9 - unbilled time       TREATMENT PERFORMED     Manual Therapy - for symptom modulation, soft tissue sensitivity and mobility, joint mobility, ROM   In sidelying, STM and Hypervolt along L gluteus medius and minimus DTM/Tpr at gluteus medius Long-axis distraction, gr II for pain relief L Hip PROM within pt tolerance         Therapeutic Exercise - for improved soft tissue mobility, lumbopelvic/hip mobility, isometrics for analgesic effect   Sidelying clamshell, sidelying R; x10 with light manual resistance and emphasis on eccentrics Supine Bridge with Red Tband Abduction; 2x10 Lower trunk rotations; x10 each direction   *not today* Sit to stand, raised table; 2x8 Supine piriformis stretch; 3x30sec Hip ADD isometrics, with belt and ball; 2x10, 5 sec Hip ABD and ADD isometrics, with belt and ball; x10, 5 sec           ASSESSMENT Patient has longstanding history of difficulty with blood pressure control. Measurement was obtained today due to report of recent measurements in unsafe/emergent range. Patient does have high blood pressure with measurements taken, but her BP did not raise to level of hypertensive emergency or to that which would contraindicate exercise.  Discussed with patient continued follow-up with her physician for appropriate medical management. She tolerates modest progression of manually resisted hip ER eccentrics well this evening and has made fair progress to date. Patient has remaining deficits in hip strength, functional mobility, LLE weightbearing tolerance and gait changes, and L gluteal pain. Patient will benefit from continued skilled therapeutic intervention to address the above deficits as needed for improved function and QoL.             PT Short Term Goals - 01/05/21 1655       PT SHORT TERM GOAL #1   Title Pt will be independent and 100% compliant with established HEP and activity modification as needed to augment PT intervention and prevent flare-up of hip pain as needed for best return  to prior level of function.    Baseline 01/04/21: discussed palliative care and activity modification; initiating HEP next visit    Time 3    Period Weeks    Status New    Target Date 01/26/21               PT Long Term Goals - 01/04/21 1909       PT LONG TERM GOAL #1   Title Patient will demonstrate improved function as evidenced by a score of 47 on FOTO measure for full participation in activities at home and in the community.    Baseline 01/04/21: FOTO 27    Time 8    Period Weeks    Status New    Target Date 03/01/21      PT LONG TERM GOAL #2   Title Patient will have hip strength to 4+/5 or greater for all motions tested without reproduction of pain indicative of improved capacity for loading paraspinal/pelvic and gluteal mm as needed for performance of transferring, stair negotiation    Baseline 01/04/21: 4/5 hip flexors and abduction(seated), 4/5 quad and HS, weak hip ER,    Time 8    Period Weeks    Status New    Target Date 03/01/21      PT LONG TERM GOAL #3   Title Patient will perform stair negotiaiton in clinic, ascending and descending 4 steps with unilateral handrail support and no loss of balance or  buckling independently without net increase in hip/back pain as needed for accessing home and community    Baseline 01/04/21: Significant pain with stair negotiation    Time 8    Period Weeks    Status New    Target Date 03/01/21      PT LONG TERM GOAL #4   Title Patient will have no increase in pain with functional hip ROM in all planes as needed for lower limb management during self-care tasks, car transfers, and bed mobility tasks    Baseline 01/04/21: Pain with end-range hip ER and IR    Time 6    Period Weeks    Status New    Target Date 02/15/21      PT LONG TERM GOAL #5   Title Patient will perform independent sit to stand from 19-20 inch chair with no UE support and no reproduction of hip pain indicative of improved ability to perform transferring    Baseline 01/04/21: difficulty and pain with sit to stand    Time 6    Period Weeks    Status New    Target Date 02/15/21                   Plan - 01/31/21 2256     Clinical Impression Statement Patient has longstanding history of difficulty with blood pressure control. Measurement was obtained today due to report of recent measurements in unsafe/emergent range. Patient does have high blood pressure with measurements taken, but her BP did not raise to level of hypertensive emergency or to that which would contraindicate exercise. Discussed with patient continued follow-up with her physician for appropriate medical management. She tolerates modest progression of manually resisted hip ER eccentrics well this evening and has made fair progress to date. Patient has remaining deficits in hip strength, functional mobility, LLE weightbearing tolerance and gait changes, and L gluteal pain. Patient will benefit from continued skilled therapeutic intervention to address the above deficits as needed for improved function and QoL.  Personal Factors and Comorbidities Education;Comorbidity 3+;Age;Time since onset of  injury/illness/exacerbation    Comorbidities AAA, AVN, HTN, Hx of bilateral LE edema, connective tissue disorder, Type 2 DM    Examination-Activity Limitations Locomotion Level;Squat;Stairs;Stand;Transfers;Bed Mobility    Examination-Participation Restrictions Driving;Community Activity;Cleaning    Stability/Clinical Decision Making Evolving/Moderate complexity    Rehab Potential Fair    PT Frequency 2x / week    PT Duration 8 weeks    PT Treatment/Interventions ADLs/Self Care Home Management;Electrical Stimulation;Moist Heat;Gait training;Stair training;Functional mobility training;Neuromuscular re-education;Balance training;Therapeutic exercise;Therapeutic activities;Patient/family education;Manual techniques;Passive range of motion    PT Next Visit Plan Manual therapy for desensitization and soft tissue mobility of affected gluteal musculature, gentle stretching and isometrics. Will progress with gentle gluteal activation and hip mobility work as tolerated.    PT Home Exercise Plan Access Code [id number]    Consulted and Agree with Plan of Care Patient             Patient will benefit from skilled therapeutic intervention in order to improve the following deficits and impairments:  Abnormal gait, Pain, Postural dysfunction, Decreased mobility, Decreased activity tolerance, Decreased endurance, Decreased range of motion, Decreased strength, Impaired flexibility, Difficulty walking  Visit Diagnosis: Pain in left hip  Difficulty in walking, not elsewhere classified  Muscle weakness (generalized)     Problem List Patient Active Problem List   Diagnosis Date Noted   Nausea 03/31/2020   Multiple thyroid nodules 12/28/2019   Thyroid nodule 09/29/2019   Lymphedema 11/10/2018   PAD (peripheral artery disease) ([place]) 10/12/2018   Aortic atherosclerosis ([place]) 10/12/2018   Carotid stenosis, asymptomatic, bilateral 10/12/2018   Positive colorectal cancer screening using Cologuard test  10/09/2018   Gastroesophageal reflux disease    Acute gastritis without hemorrhage    Osteoarthritis 04/29/2018   Hiatal hernia with GERD 04/29/2018   Hyperlipidemia 04/29/2018   Lymphedema of both lower extremities 04/29/2018   Left knee pain 10/23/2016   Cataracts, bilateral 07/31/2016   Glaucoma 07/31/2016   Lateral epicondylitis of left elbow 10/20/2015   Vitamin D deficiency 10/20/2015   Type 2 diabetes, controlled, with neuropathy ([place]) 10/16/2015   Gouty arthritis 06/03/2015   H/O syncope 06/03/2015   Mild intermittent asthma 06/03/2015   Multiple gastric ulcers 06/03/2015   Schatzki's ring 06/03/2015   Undifferentiated connective tissue disease ([place]) 06/03/2015   Bone disorder 04/05/2015   Dental injury 04/05/2015   Pericarditis 04/05/2015   Sjogren's syndrome ([place]) 09/07/2014   Elevated alkaline phosphatase level 08/24/2014   Lactose intolerance 08/24/2014   Obesity 08/24/2014   Peripheral edema 08/24/2014   Benign hypertension with CKD (chronic kidney disease) stage III 08/03/2014   Abdominal adhesions 08/02/2014   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 [place], [place], [address] Phone: [phone]   Fax:  [phone]  Name: [name] MRN: [id number] Date of Birth: 25-Nov-1945

## 2021-01-31 ENCOUNTER — Encounter: Payer: Self-pay | Admitting: Physical Therapy

## 2021-01-31 ENCOUNTER — Encounter: Payer: Medicare Other | Admitting: Physical Therapy

## 2021-02-01 ENCOUNTER — Encounter: Payer: Medicare Other | Admitting: Physical Therapy

## 2021-02-01 ENCOUNTER — Telehealth: Payer: Self-pay | Admitting: Cardiovascular Disease

## 2021-02-01 NOTE — Telephone Encounter (Signed)
Pt c/o medication issue:  1. Name of Medication: losartan   2. How are you currently taking this medication (dosage and times per day)? 50 mg po BID   3. Are you having a reaction (difficulty breathing--STAT)? no  4. What is your medication issue? Received refill with directions for 75 mg po q d .  Please call to confirm which dose is correct

## 2021-02-01 NOTE — Telephone Encounter (Signed)
Attempted to reach pt via phone, unable to make contact with pt. LDM on VM advising pt correct dosing of Losartan   Losartan 50 mg BID  Dr. Rockey Situ sent in back in June to [facility]. This RN called [facility], they verify correct order for losartan 50 mg with 3 fills. They were able to see that [facility] mail order pharmacy pulled pt's old order as to why she has the wrong order for 75 mg.   Advised pt to continue refills at [facility] for correct 50 mg dosage or she may have script transfer to [place]

## 2021-02-02 ENCOUNTER — Encounter: Payer: Medicare Other | Admitting: Physical Therapy

## 2021-02-06 ENCOUNTER — Ambulatory Visit: Payer: Medicare Other | Admitting: Physical Therapy

## 2021-02-06 ENCOUNTER — Other Ambulatory Visit: Payer: Self-pay

## 2021-02-06 DIAGNOSIS — R262 Difficulty in walking, not elsewhere classified: Secondary | ICD-10-CM

## 2021-02-06 DIAGNOSIS — M25552 Pain in left hip: Secondary | ICD-10-CM

## 2021-02-06 DIAGNOSIS — M6281 Muscle weakness (generalized): Secondary | ICD-10-CM

## 2021-02-06 NOTE — Therapy (Addendum)
[facility] [address]. [place], [place], [address] Phone: [phone]   Fax:  [phone]  Physical Therapy Treatment/ Physical Therapy Progress Note   Dates of reporting period  01/04/21   to   02/06/21   Patient Details  Name: [name] MRN: [id number] Date of Birth: 1945/09/15 Referring Provider (PT): Rolanda Jay [place], [place]   Encounter Date: 02/06/2021   PT End of Session - 02/06/21 1725     Visit Number 7    Number of Visits 17    Date for PT Re-Evaluation 03/01/21    Progress Note Due on Visit 17    PT Start Time 1721    PT Stop Time 1805    PT Time Calculation (min) 44 min    Activity Tolerance Patient tolerated treatment well;Patient limited by pain    Behavior During Therapy [facility] for tasks assessed/performed             Past Medical History:  Diagnosis Date   Anterolisthesis    Cervical spine   Asthma    Connective tissue disorder ([place])    recurrent carotid arteritis, temporal arteritis, vasculitis mandible, general hepatitis, avascular necrosis bilat   DDD (degenerative disc disease), lumbar    Diabetes mellitus without complication (HCC)    Diverticulosis    Duodenitis    Ganglion cyst of right foot    Gouty arthritis    Hiatal hernia with GERD    History of cardiovascular stress test    a. 07/2018 [facility]): Fixed inferoapical defect w/ nl contraction-->attenuation artifact. No ischemia. EF 63%.   Hyperlipidemia    Hypertension    a. 02/2019 Renal artery duplex: no evidence of prox RAS.   Hyperuricemia    Keratitis sicca, bilateral (HCC)    Lymphedema of both lower extremities    Osteoarthritis    Pericarditis    Recurrent: Aug 97, July 05, Sept 08, July 16, July 18   PUD (peptic ulcer disease)    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[place])    Sjogren's syndrome ([place])    Syncope    Tendonitis, Achilles, right    Tortuous colon    Trigger finger     Past Surgical History:  Procedure  Laterality Date   ABDOMINAL HYSTERECTOMY     BREAST BIOPSY     BREAST EXCISIONAL BIOPSY Left 1986   neg   CHOLECYSTECTOMY     COLONOSCOPY WITH PROPOFOL N/A 08/26/2018   Procedure: COLONOSCOPY WITH PROPOFOL;  Surgeon: Lucilla Lame, MD;  Location: [facility] ENDOSCOPY;  Service: Endoscopy;  Laterality: N/A;   CYSTOSCOPY  02/26/2018   [name], MD    distal arthrectomy     ESOPHAGOGASTRODUODENOSCOPY (EGD) WITH PROPOFOL N/A 08/26/2018   Procedure: ESOPHAGOGASTRODUODENOSCOPY (EGD) WITH PROPOFOL;  Surgeon: Lucilla Lame, MD;  Location: [facility] ENDOSCOPY;  Service: Endoscopy;  Laterality: N/A;   EXCISION NEUROMA     KNEE SURGERY Bilateral    1985, 2001, 11/2002, 12/2006   panhysterectomy  10/1983   SHOULDER SURGERY Right    reverse total arthroplasty w/ biceps tenodesis   TONSILLECTOMY AND ADENOIDECTOMY     TOTAL HIP ARTHROPLASTY Right 04/2007   WISDOM TOOTH EXTRACTION      There were no vitals filed for this visit.   Subjective Assessment - 02/06/21 1734     Subjective Patient reports good blood pressure readings recently with systolic BP < 301 and diastolic blood pressure in the 80s. She reports that she has gotten better with PT. 80% SANE  score at this time. She reports most difficulty with ascending steps. Patient reports intermittent pain with sit to stand - she reports she has to use upper limb assist to get up. She reports doing okay with home-distance mobility gait and with getting to places in town.    Pertinent History Patient is a 75 year old female with previous episode of PT in this clinic related to low back pain. Patient has current primary complaint of L hip pain. Patient reports she was going ot sit onto toilet and was going to sit and fell into sitting in uncontrolled manner with weight shifted onto her L hip (DOI: 12/08/2020). She states she had pain at the time that worsened more the following day. Patient reports history of bursitis in L hip with multiple episode since the mid  1980s. Patient reports pain affecting posteiror gluteal and L SI region. Pt has history of L AVN. Patient had radiographs this past Thursday - evidence of degenerative changes and known lumbar DDD. Patient reports 4/10 presently. Patient reports 10/10 pain at the worst. Patient reports no pain lower than 4/10. Patient denies numbness and tingling. Patient reports lower pain if she doesn't "use it." Aggravating factors: walking, more weightbearing, stair ascent>descent.. Easing factors: resting it, "not doing anything." Hx of R rTSA, R THA, L partial knee replacement. Goal: get rid of pain. She reports pain with every step of walking.    Limitations Walking;Standing;House hold activities    Patient Stated Goals Decrease back pain/ improve strength/ mobility.    Pain Onset --   30 years             OBJECTIVE FINDINGS  Gait Mild forward flexed posture throughout gait cycle, mild ipsilateral sidebend with stance phase bilaterally   Palpation Tenderness to palpation along L greater trochanter 2+, L gluteus medius and minimus 2+, ischial tuberosity 2+   Strength (out of 5) R/L 4+/4+ Hip flexion 4/4* Hip ER 4+/4+ Hip IR 4+/4+* Hip abduction (seated) 5/5 Hip adduction 4/4 Knee extension 5/4+ Knee flexion *Indicates pain   AROM Lumbar forward flexion (65): 100% Lumbar extension (30): 50% Lumbar lateral flexion (25): R: 75% L: 75% Thoracic and Lumbar rotation (30 degrees):  R: 75% L: 75% *Indicates pain     PROM (degrees) R/L Hip flexion: Not tested/120 Hip external rotation: Not tested/45 Hip internal rotation: Not tested/WFL Hip abduction: Not tested/WNL    Functional Tasks Sit to stand: Significant UE support and weight shifted to LLE with no pain behaviors         TREATMENT PERFORMED     NuStep x 5 minutes for improved soft tissue extensibility/mobility prior to manual therapy and exercise; Level 1, seat 9 - unbilled time    Therapeutic  Activities  Re-assessment performed (see above)    Manual Therapy - for symptom modulation, soft tissue sensitivity and mobility, joint mobility, ROM   In sidelying, STM and Hypervolt along L gluteus medius and minimus DTM/Tpr at gluteus medius Long-axis distraction, gr II for pain relief L Hip PROM within pt tolerance         Therapeutic Exercise - for improved soft tissue mobility, lumbopelvic/hip mobility, isometrics for analgesic effect   *next visit* Lower trunk rotations; x10 each direction Sidelying clamshell, sidelying R; x10 with light manual resistance and emphasis on eccentrics Supine Bridge with Red Tband Abduction; 2x10    *not today* Sit to stand, raised table; 2x8 Supine piriformis stretch; 3x30sec Hip ADD isometrics, with belt and ball; 2x10, 5 sec Hip ABD  and ADD isometrics, with belt and ball; x10, 5 sec           ASSESSMENT Patient feels that she has made progress in regard to pain status and exhibits improving ability to perform sit to stand and improved gait deviations. She has improved FOTO score and surpassed established FOTO goal early in POC. She has improved strength, though weakness remains in hip external rotators and quadriceps. She has improved tolerance of hip and trunk ROM with no significant mobility deficit at this time. She has at least partially met all established goals with exception of stair negotiation goal (deferred at this time). Patient has remaining deficits in hip strength, functional mobility, difficulty with stair negotiation, and L gluteal pain. Patient will benefit from continued skilled therapeutic intervention to address the above deficits as needed for improved function and QoL.               PT Short Term Goals - 02/07/21 1542       PT SHORT TERM GOAL #1   Title Pt will be independent and 100% compliant with established HEP and activity modification as needed to augment PT intervention and prevent flare-up of hip pain  as needed for best return to prior level of function.    Baseline 01/04/21: discussed palliative care and activity modification; initiating HEP next visit. 02/06/21: fair compliance with HEP    Time 3    Period Weeks    Status Partially Met    Target Date 02/14/21               PT Long Term Goals - 02/06/21 1735       PT LONG TERM GOAL #1   Title Patient will demonstrate improved function as evidenced by a score of 47 on FOTO measure for full participation in activities at home and in the community.    Baseline 01/04/21: FOTO 27.   02/06/21: FOTO 57    Time 8    Period Weeks    Status Achieved    Target Date 03/01/21      PT LONG TERM GOAL #2   Title Patient will have hip strength to 4+/5 or greater for all motions tested without reproduction of pain indicative of improved capacity for loading paraspinal/pelvic and gluteal mm as needed for performance of transferring, stair negotiation    Baseline 01/04/21: 4/5 hip flexors and abduction(seated), 4/5 quad and HS, weak hip ER.  02/06/21: Remaining weakness in quads and Hip ER    Time 8    Period Weeks    Status Partially Met    Target Date 03/01/21      PT LONG TERM GOAL #3   Title Patient will perform stair negotiaiton in clinic, ascending and descending 4 steps with unilateral handrail support and no loss of balance or buckling independently without net increase in hip/back pain as needed for accessing home and community    Baseline 01/04/21: Significant pain with stair negotiation. 02/07/21: Deferred    Time 8    Period Weeks    Status Deferred    Target Date 03/01/21      PT LONG TERM GOAL #4   Title Patient will have no increase in pain with functional hip ROM in all planes as needed for lower limb management during self-care tasks, car transfers, and bed mobility tasks    Baseline 01/04/21: Pain with end-range hip ER and IR.   02/07/21: No pain wth hip PROM/AROM at this time, will assess next visit  for delayed-onset pain/soreness     Time 6    Period Weeks    Status Achieved    Target Date 02/15/21      PT LONG TERM GOAL #5   Title Patient will perform independent sit to stand from 19-20 inch chair with no UE support and no reproduction of hip pain indicative of improved ability to perform transferring    Baseline 01/04/21: difficulty and pain with sit to stand.   02/07/21: Performed IND without increase in pain with closed-chain upper limb extension to facilitate lifting of pelvis    Time 6    Period Weeks    Status Partially Met    Target Date 02/15/21                   Plan - 02/07/21 1550     Clinical Impression Statement Patient feels that she has made progress in regard to pain status and exhibits improving ability to perform sit to stand and improved gait deviations. She has improved FOTO score and surpassed established FOTO goal early in POC. She has improved strength, though weakness remains in hip external rotators and quadriceps. She has improved tolerance of hip and trunk ROM with no significant mobility deficit at this time. She has at least partially met all established goals with exception of stair negotiation goal (deferred at this time). Patient has remaining deficits in hip strength, functional mobility, difficulty with stair negotiation, and L gluteal pain. Patient will benefit from continued skilled therapeutic intervention to address the above deficits as needed for improved function and QoL.    Personal Factors and Comorbidities Education;Comorbidity 3+;Age;Time since onset of injury/illness/exacerbation    Comorbidities AAA, AVN, HTN, Hx of bilateral LE edema, connective tissue disorder, Type 2 DM    Examination-Activity Limitations Locomotion Level;Squat;Stairs;Stand;Transfers;Bed Mobility    Examination-Participation Restrictions Driving;Community Activity;Cleaning    Stability/Clinical Decision Making Evolving/Moderate complexity    Rehab Potential Fair    PT Frequency 2x / week    PT  Duration 8 weeks    PT Treatment/Interventions ADLs/Self Care Home Management;Electrical Stimulation;Moist Heat;Gait training;Stair training;Functional mobility training;Neuromuscular re-education;Balance training;Therapeutic exercise;Therapeutic activities;Patient/family education;Manual techniques;Passive range of motion    PT Next Visit Plan Manual therapy for desensitization and soft tissue mobility of affected gluteal musculature, gentle stretching.. Progress with gluteal activation, progressive loading and gradually increasing emphasis on standing and functional activities/CKC drills as tolerated.    PT Home Exercise Plan Access Code [email or web address]    Consulted and Agree with Plan of Care Patient             Patient will benefit from skilled therapeutic intervention in order to improve the following deficits and impairments:  Abnormal gait, Pain, Postural dysfunction, Decreased mobility, Decreased activity tolerance, Decreased endurance, Decreased range of motion, Decreased strength, Impaired flexibility, Difficulty walking  Visit Diagnosis: Pain in left hip  Difficulty in walking, not elsewhere classified  Muscle weakness (generalized)     Problem List Patient Active Problem List   Diagnosis Date Noted   Nausea 03/31/2020   Multiple thyroid nodules 12/28/2019   Thyroid nodule 09/29/2019   Lymphedema 11/10/2018   PAD (peripheral artery disease) ([place]) 10/12/2018   Aortic atherosclerosis ([place]) 10/12/2018   Carotid stenosis, asymptomatic, bilateral 10/12/2018   Positive colorectal cancer screening using Cologuard test 10/09/2018   Gastroesophageal reflux disease    Acute gastritis without hemorrhage    Osteoarthritis 04/29/2018   Hiatal hernia with GERD 04/29/2018   Hyperlipidemia 04/29/2018   Lymphedema of both lower  extremities 04/29/2018   Left knee pain 10/23/2016   Cataracts, bilateral 07/31/2016   Glaucoma 07/31/2016   Lateral epicondylitis of left elbow 10/20/2015    Vitamin D deficiency 10/20/2015   Type 2 diabetes, controlled, with neuropathy ([place]) 10/16/2015   Gouty arthritis 06/03/2015   H/O syncope 06/03/2015   Mild intermittent asthma 06/03/2015   Multiple gastric ulcers 06/03/2015   Schatzki's ring 06/03/2015   Undifferentiated connective tissue disease ([place]) 06/03/2015   Bone disorder 04/05/2015   Dental injury 04/05/2015   Pericarditis 04/05/2015   Sjogren's syndrome ([place]) 09/07/2014   Elevated alkaline phosphatase level 08/24/2014   Lactose intolerance 08/24/2014   Obesity 08/24/2014   Peripheral edema 08/24/2014   Benign hypertension with CKD (chronic kidney disease) stage III 08/03/2014   Abdominal adhesions 08/02/2014   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 [place], [place], [address] Phone: [phone]   Fax:  [phone]  Name: [name] MRN: [id number] Date of Birth: 19-Apr-1946

## 2021-02-07 ENCOUNTER — Encounter: Payer: Self-pay | Admitting: Physical Therapy

## 2021-02-07 ENCOUNTER — Encounter: Payer: Medicare Other | Admitting: Physical Therapy

## 2021-02-08 ENCOUNTER — Ambulatory Visit: Payer: Medicare Other | Admitting: Physical Therapy

## 2021-02-08 ENCOUNTER — Encounter: Payer: Self-pay | Admitting: Physical Therapy

## 2021-02-08 ENCOUNTER — Other Ambulatory Visit: Payer: Self-pay

## 2021-02-08 DIAGNOSIS — M25552 Pain in left hip: Secondary | ICD-10-CM

## 2021-02-08 DIAGNOSIS — M6281 Muscle weakness (generalized): Secondary | ICD-10-CM

## 2021-02-08 DIAGNOSIS — R262 Difficulty in walking, not elsewhere classified: Secondary | ICD-10-CM

## 2021-02-08 NOTE — Therapy (Signed)
[facility] [address] [place], [place], [address] Phone: [phone]   Fax:  [phone]  Physical Therapy Treatment  Patient Details  Name: [name] MRN: [id number] Date of Birth: 03/09/1946 Referring Provider (PT): Jason Hestle Whitaker, PA   Encounter Date: 02/08/2021   PT End of Session - 02/08/21 2146     Visit Number 8    Number of Visits 17    Date for PT Re-Evaluation 03/01/21    Progress Note Due on Visit 17    PT Start Time 1630    PT Stop Time 1715    PT Time Calculation (min) 45 min    Activity Tolerance Patient tolerated treatment well;Patient limited by pain    Behavior During Therapy WFL for tasks assessed/performed             Past Medical History:  Diagnosis Date   Anterolisthesis    Cervical spine   Asthma    Connective tissue disorder (HCC)    recurrent carotid arteritis, temporal arteritis, vasculitis mandible, general hepatitis, avascular necrosis bilat   DDD (degenerative disc disease), lumbar    Diabetes mellitus without complication (HCC)    Diverticulosis    Duodenitis    Ganglion cyst of right foot    Gouty arthritis    Hiatal hernia with GERD    History of cardiovascular stress test    a. 07/2018 MV ([facility]): Fixed inferoapical defect w/ nl contraction-->attenuation artifact. No ischemia. EF 63%.   Hyperlipidemia    Hypertension    a. 02/2019 Renal artery duplex: no evidence of prox RAS.   Hyperuricemia    Keratitis sicca, bilateral (HCC)    Lymphedema of both lower extremities    Osteoarthritis    Pericarditis    Recurrent: Aug 97, July 05, Sept 08, July 16, July 18   PUD (peptic ulcer disease)    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HCC)    Sjogren's syndrome (HCC)    Syncope    Tendonitis, Achilles, right    Tortuous colon    Trigger finger     Past Surgical History:  Procedure Laterality Date   ABDOMINAL HYSTERECTOMY     BREAST BIOPSY     BREAST EXCISIONAL BIOPSY Left  1986   neg   CHOLECYSTECTOMY     COLONOSCOPY WITH PROPOFOL N/A 08/26/2018   Procedure: COLONOSCOPY WITH PROPOFOL;  Surgeon: Wohl, Darren, MD;  Location: [facility] ENDOSCOPY;  Service: Endoscopy;  Laterality: N/A;   CYSTOSCOPY  02/26/2018   Michael Wolff, MD    distal arthrectomy     ESOPHAGOGASTRODUODENOSCOPY (EGD) WITH PROPOFOL N/A 08/26/2018   Procedure: ESOPHAGOGASTRODUODENOSCOPY (EGD) WITH PROPOFOL;  Surgeon: Wohl, Darren, MD;  Location: [facility] ENDOSCOPY;  Service: Endoscopy;  Laterality: N/A;   EXCISION NEUROMA     KNEE SURGERY Bilateral    1985, 2001, 11/2002, 12/2006   panhysterectomy  10/1983   SHOULDER SURGERY Right    reverse total arthroplasty w/ biceps tenodesis   TONSILLECTOMY AND ADENOIDECTOMY     TOTAL HIP ARTHROPLASTY Right 04/2007   WISDOM TOOTH EXTRACTION      There were no vitals filed for this visit.   Subjective Assessment - 02/08/21 1636     Subjective Patient reports that she does still have L hip pain, but it is is relatively low level in the PM. Patient reports no pain at arrival. She states pain is around 2/10 generally. Patient reports general soreness affecting R anterior knee.    Pertinent History Patient is a   75 year old female with previous episode of PT in this clinic related to low back pain. Patient has current primary complaint of L hip pain. Patient reports she was going ot sit onto toilet and was going to sit and fell into sitting in uncontrolled manner with weight shifted onto her L hip (DOI: 12/08/2020). She states she had pain at the time that worsened more the following day. Patient reports history of bursitis in L hip with multiple episode since the mid 1980s. Patient reports pain affecting posteiror gluteal and L SI region. Pt has history of L AVN. Patient had radiographs this past Thursday - evidence of degenerative changes and known lumbar DDD. Patient reports 4/10 presently. Patient reports 10/10 pain at the worst. Patient reports no pain lower than 4/10.  Patient denies numbness and tingling. Patient reports lower pain if she doesn't "use it." Aggravating factors: walking, more weightbearing, stair ascent>descent.. Easing factors: resting it, "not doing anything." Hx of R rTSA, R THA, L partial knee replacement. Goal: get rid of pain. She reports pain with every step of walking.    Limitations Walking;Standing;House hold activities    Patient Stated Goals Decrease back pain/ improve strength/ mobility.    Pain Onset --   30 years             TREATMENT PERFORMED     NuStep x 5 minutes for improved soft tissue extensibility/mobility prior to manual therapy and exercise; Level 1, seat 11 - unbilled time     Manual Therapy - for symptom modulation, soft tissue sensitivity and mobility, joint mobility, ROM   In sidelying, STM and Hypervolt along L gluteus medius and minimus DTM/Tpr at gluteus medius Long-axis distraction, gr II for pain relief L Hip PROM within pt tolerance         Therapeutic Exercise - for improved soft tissue mobility, lumbopelvic/hip mobility, isometrics for analgesic effect     Lower trunk rotations; x10 each direction Sidelying clamshell, sidelying R; x10 with light manual resistance and emphasis on eccentrics Supine Bridge with Green Tband Abduction; 2x10   *next visit*  Sit to stand, raised table; 2x8   *not today* Supine piriformis stretch; 3x30sec Hip ADD isometrics, with belt and ball; 2x10, 5 sec Hip ABD and ADD isometrics, with belt and ball; x10, 5 sec           ASSESSMENT Patient reports low-level pain at this time and has made good progress with tolerance of gluteal musculature loading. She did have increase in pain in R knee following MMT on contralateral limb last visit - this appears to be self-limiting. Patient tolerates continued low-impact isotonics in clinic well this afternoon. Patient has remaining deficits in hip strength, functional mobility, difficulty with stair negotiation, and  L gluteal pain. Patient will benefit from continued skilled therapeutic intervention to address the above deficits as needed for improved function and QoL.                  PT Short Term Goals - 02/07/21 1542       PT SHORT TERM GOAL #1   Title Pt will be independent and 100% compliant with established HEP and activity modification as needed to augment PT intervention and prevent flare-up of hip pain as needed for best return to prior level of function.    Baseline 01/04/21: discussed palliative care and activity modification; initiating HEP next visit. 02/06/21: fair compliance with HEP    Time 3    Period Weeks    Status  Partially Met    Target Date 02/14/21               PT Long Term Goals - 02/06/21 1735       PT LONG TERM GOAL #1   Title Patient will demonstrate improved function as evidenced by a score of 47 on FOTO measure for full participation in activities at home and in the community.    Baseline 01/04/21: FOTO 27.   02/06/21: FOTO 57    Time 8    Period Weeks    Status Achieved    Target Date 03/01/21      PT LONG TERM GOAL #2   Title Patient will have hip strength to 4+/5 or greater for all motions tested without reproduction of pain indicative of improved capacity for loading paraspinal/pelvic and gluteal mm as needed for performance of transferring, stair negotiation    Baseline 01/04/21: 4/5 hip flexors and abduction(seated), 4/5 quad and HS, weak hip ER.  02/06/21: Remaining weakness in quads and Hip ER    Time 8    Period Weeks    Status Partially Met    Target Date 03/01/21      PT LONG TERM GOAL #3   Title Patient will perform stair negotiaiton in clinic, ascending and descending 4 steps with unilateral handrail support and no loss of balance or buckling independently without net increase in hip/back pain as needed for accessing home and community    Baseline 01/04/21: Significant pain with stair negotiation. 02/07/21: Deferred    Time 8    Period Weeks     Status Deferred    Target Date 03/01/21      PT LONG TERM GOAL #4   Title Patient will have no increase in pain with functional hip ROM in all planes as needed for lower limb management during self-care tasks, car transfers, and bed mobility tasks    Baseline 01/04/21: Pain with end-range hip ER and IR.   02/07/21: No pain wth hip PROM/AROM at this time, will assess next visit for delayed-onset pain/soreness    Time 6    Period Weeks    Status Achieved    Target Date 02/15/21      PT LONG TERM GOAL #5   Title Patient will perform independent sit to stand from 19-20 inch chair with no UE support and no reproduction of hip pain indicative of improved ability to perform transferring    Baseline 01/04/21: difficulty and pain with sit to stand.   02/07/21: Performed IND without increase in pain with closed-chain upper limb extension to facilitate lifting of pelvis    Time 6    Period Weeks    Status Partially Met    Target Date 02/15/21                   Plan - 02/08/21 2150     Clinical Impression Statement Patient reports low-level pain at this time and has made good progress with tolerance of gluteal musculature loading. She did have increase in pain in R knee following MMT on contralateral limb last visit - this appears to be self-limiting. Patient tolerates continued low-impact isotonics in clinic well this afternoon. Patient has remaining deficits in hip strength, functional mobility, difficulty with stair negotiation, and L gluteal pain. Patient will benefit from continued skilled therapeutic intervention to address the above deficits as needed for improved function and QoL.    Personal Factors and Comorbidities Education;Comorbidity 3+;Age;Time since onset of injury/illness/exacerbation  Comorbidities AAA, AVN, HTN, Hx of bilateral LE edema, connective tissue disorder, Type 2 DM    Examination-Activity Limitations Locomotion Level;Squat;Stairs;Stand;Transfers;Bed Mobility     Examination-Participation Restrictions Driving;Community Activity;Cleaning    Stability/Clinical Decision Making Evolving/Moderate complexity    Rehab Potential Fair    PT Frequency 2x / week    PT Duration 8 weeks    PT Treatment/Interventions ADLs/Self Care Home Management;Electrical Stimulation;Moist Heat;Gait training;Stair training;Functional mobility training;Neuromuscular re-education;Balance training;Therapeutic exercise;Therapeutic activities;Patient/family education;Manual techniques;Passive range of motion    PT Next Visit Plan Manual therapy for desensitization and soft tissue mobility of affected gluteal musculature, gentle stretching.. Progress with gluteal activation, progressive loading and gradually increasing emphasis on standing and functional activities/CKC drills as tolerated.    PT Home Exercise Plan Access Code [email or web address]    Consulted and Agree with Plan of Care Patient             Patient will benefit from skilled therapeutic intervention in order to improve the following deficits and impairments:  Abnormal gait, Pain, Postural dysfunction, Decreased mobility, Decreased activity tolerance, Decreased endurance, Decreased range of motion, Decreased strength, Impaired flexibility, Difficulty walking  Visit Diagnosis: Pain in left hip  Difficulty in walking, not elsewhere classified  Muscle weakness (generalized)     Problem List Patient Active Problem List   Diagnosis Date Noted   Nausea 03/31/2020   Multiple thyroid nodules 12/28/2019   Thyroid nodule 09/29/2019   Lymphedema 11/10/2018   PAD (peripheral artery disease) (HCC) 10/12/2018   Aortic atherosclerosis (HCC) 10/12/2018   Carotid stenosis, asymptomatic, bilateral 10/12/2018   Positive colorectal cancer screening using Cologuard test 10/09/2018   Gastroesophageal reflux disease    Acute gastritis without hemorrhage    Osteoarthritis 04/29/2018   Hiatal hernia with GERD 04/29/2018   Hyperlipidemia  04/29/2018   Lymphedema of both lower extremities 04/29/2018   Left knee pain 10/23/2016   Cataracts, bilateral 07/31/2016   Glaucoma 07/31/2016   Lateral epicondylitis of left elbow 10/20/2015   Vitamin D deficiency 10/20/2015   Type 2 diabetes, controlled, with neuropathy (HCC) 10/16/2015   Gouty arthritis 06/03/2015   H/O syncope 06/03/2015   Mild intermittent asthma 06/03/2015   Multiple gastric ulcers 06/03/2015   Schatzki's ring 06/03/2015   Undifferentiated connective tissue disease (HCC) 06/03/2015   Bone disorder 04/05/2015   Dental injury 04/05/2015   Pericarditis 04/05/2015   Sjogren's syndrome (HCC) 09/07/2014   Elevated alkaline phosphatase level 08/24/2014   Lactose intolerance 08/24/2014   Obesity 08/24/2014   Peripheral edema 08/24/2014   Benign hypertension with CKD (chronic kidney disease) stage III 08/03/2014   Abdominal adhesions 08/02/2014   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HCC) 08/02/2014   Degenerative joint disease (DJD) of lumbar spine 08/02/2014   Diverticulosis of both small and large intestine 08/02/2014   Hyperuricemia 08/02/2014   Pure hypercholesterolemia 08/02/2014   Trigger finger 08/02/2014   Right shoulder pain 07/29/2014   Benign neoplasm of colon 06/30/2010   Right upper quadrant pain 06/30/2010   Jeremy Peterson, PT, DPT #[id number]  Jeremy T Peterson 02/08/2021, 9:50 PM  [place] [facility] [address] [place], [place], [address] Phone: [phone]   Fax:  [phone]  Name: [name] MRN: [id number] Date of Birth: 10/19/1945    

## 2021-02-09 ENCOUNTER — Encounter: Payer: Medicare Other | Admitting: Physical Therapy

## 2021-02-14 ENCOUNTER — Encounter: Payer: Medicare Other | Admitting: Physical Therapy

## 2021-02-15 ENCOUNTER — Ambulatory Visit: Payer: Medicare Other | Attending: Family Medicine | Admitting: Physical Therapy

## 2021-02-15 ENCOUNTER — Other Ambulatory Visit: Payer: Self-pay

## 2021-02-15 ENCOUNTER — Encounter: Payer: Medicare Other | Admitting: Physical Therapy

## 2021-02-15 VITALS — BP 182/79

## 2021-02-15 DIAGNOSIS — M25561 Pain in right knee: Secondary | ICD-10-CM | POA: Insufficient documentation

## 2021-02-15 DIAGNOSIS — M6281 Muscle weakness (generalized): Secondary | ICD-10-CM | POA: Diagnosis present

## 2021-02-15 DIAGNOSIS — M545 Low back pain, unspecified: Secondary | ICD-10-CM | POA: Diagnosis present

## 2021-02-15 DIAGNOSIS — M25552 Pain in left hip: Secondary | ICD-10-CM | POA: Insufficient documentation

## 2021-02-15 DIAGNOSIS — R262 Difficulty in walking, not elsewhere classified: Secondary | ICD-10-CM | POA: Diagnosis present

## 2021-02-15 DIAGNOSIS — G8929 Other chronic pain: Secondary | ICD-10-CM | POA: Diagnosis present

## 2021-02-15 DIAGNOSIS — M25661 Stiffness of right knee, not elsewhere classified: Secondary | ICD-10-CM | POA: Insufficient documentation

## 2021-02-15 NOTE — Therapy (Addendum)
[facility] [address] [place], [place], [address] Phone: [phone]   Fax:  [phone]  Physical Therapy Treatment  Patient Details  Name: [name] MRN: [id number] Date of Birth: [birth year] Referring Provider (PT): Rolanda Jay [place], [place]   Encounter Date: 02/15/2021   PT End of Session - 02/15/21 1728     Visit Number 9    Number of Visits 17    Date for PT Re-Evaluation 03/01/21    Progress Note Due on Visit 17    PT Start Time 1720    PT Stop Time 1805    PT Time Calculation (min) 45 min    Activity Tolerance Patient tolerated treatment well;Patient limited by pain    Behavior During Therapy [facility] for tasks assessed/performed             Past Medical History:  Diagnosis Date   Anterolisthesis    Cervical spine   Asthma    Connective tissue disorder ([place])    recurrent carotid arteritis, temporal arteritis, vasculitis mandible, general hepatitis, avascular necrosis bilat   DDD (degenerative disc disease), lumbar    Diabetes mellitus without complication ([place])    Diverticulosis    Duodenitis    Ganglion cyst of right foot    Gouty arthritis    Hiatal hernia with GERD    History of cardiovascular stress test    a. 07/2018 [facility]): Fixed inferoapical defect w/ nl contraction-->attenuation artifact. No ischemia. EF 63%.   Hyperlipidemia    Hypertension    a. 02/2019 Renal artery duplex: no evidence of prox RAS.   Hyperuricemia    Keratitis sicca, bilateral (HCC)    Lymphedema of both lower extremities    Osteoarthritis    Pericarditis    Recurrent: Aug 97, July 05, Sept 08, July 16, July 18   PUD (peptic ulcer disease)    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[place])    Sjogren's syndrome ([place])    Syncope    Tendonitis, Achilles, right    Tortuous colon    Trigger finger     Past Surgical History:  Procedure Laterality Date   ABDOMINAL HYSTERECTOMY     BREAST BIOPSY     BREAST EXCISIONAL BIOPSY Left 1986    neg   CHOLECYSTECTOMY     COLONOSCOPY WITH PROPOFOL N/A 08/26/2018   Procedure: COLONOSCOPY WITH PROPOFOL;  Surgeon: Lucilla Lame, MD;  Location: [facility] ENDOSCOPY;  Service: Endoscopy;  Laterality: N/A;   CYSTOSCOPY  02/26/2018   [name], MD    distal arthrectomy     ESOPHAGOGASTRODUODENOSCOPY (EGD) WITH PROPOFOL N/A 08/26/2018   Procedure: ESOPHAGOGASTRODUODENOSCOPY (EGD) WITH PROPOFOL;  Surgeon: Lucilla Lame, MD;  Location: [facility] ENDOSCOPY;  Service: Endoscopy;  Laterality: N/A;   EXCISION NEUROMA     KNEE SURGERY Bilateral    1985, 2001, 11/2002, 12/2006   panhysterectomy  10/1983   SHOULDER SURGERY Right    reverse total arthroplasty w/ biceps tenodesis   TONSILLECTOMY AND ADENOIDECTOMY     TOTAL HIP ARTHROPLASTY Right 04/2007   WISDOM TOOTH EXTRACTION      Vitals:   02/15/21 1721  BP: (!) 182/79     Subjective Assessment - 02/15/21 1730     Subjective Patient has had difficulty with blood pressure control today with SBP > 200 and DBP > 100. She returned call to clinic earlier with 138/78 BP. She reports feeling mild malaise at arrival today. She feels that her L hip is getting better and it isn't  disturbing her sleep presently.    Pertinent History Patient is a 75 year old female with previous episode of PT in this clinic related to low back pain. Patient has current primary complaint of L hip pain. Patient reports she was going ot sit onto toilet and was going to sit and fell into sitting in uncontrolled manner with weight shifted onto her L hip (DOI: 12/08/2020). She states she had pain at the time that worsened more the following day. Patient reports history of bursitis in L hip with multiple episode since the mid 1980s. Patient reports pain affecting posteiror gluteal and L SI region. Pt has history of L AVN. Patient had radiographs this past Thursday - evidence of degenerative changes and known lumbar DDD. Patient reports 4/10 presently. Patient reports 10/10 pain at the  worst. Patient reports no pain lower than 4/10. Patient denies numbness and tingling. Patient reports lower pain if she doesn't "use it." Aggravating factors: walking, more weightbearing, stair ascent>descent.. Easing factors: resting it, "not doing anything." Hx of R rTSA, R THA, L partial knee replacement. Goal: get rid of pain. She reports pain with every step of walking.    Limitations Walking;Standing;House hold activities    Patient Stated Goals Decrease back pain/ improve strength/ mobility.    Pain Onset --   30 years             TREATMENT PERFORMED     NuStep x 5 minutes for improved soft tissue extensibility/mobility prior to manual therapy and exercise; Level 2, seat 11 - unbilled time     Manual Therapy - for symptom modulation, soft tissue sensitivity and mobility, joint mobility, ROM   In sidelying, STM and Hypervolt along L gluteus medius and minimus DTM/Tpr at gluteus medius Long-axis distraction, gr II for pain relief L Hip PROM within pt tolerance   *Atttempted use of Mulligan distraction, poor tolerance of belt on adductors with folded towel to cushion belt*        Therapeutic Exercise - for improved soft tissue mobility, lumbopelvic/hip mobility, isometrics for analgesic effect   Sidelying Hip Abduction; 2x8 Supine Bridge with Blue Tband Abduction; 2x10 Sit to stand, raised table; 2x8 Supine piriformis stretch; 2x30sec   *not today* Lower trunk rotations; x10 each direction Sidelying clamshell, sidelying R; x10 with light manual resistance and emphasis on eccentrics Hip ADD isometrics, with belt and ball; 2x10, 5 sec Hip ABD and ADD isometrics, with belt and ball; x10, 5 sec           ASSESSMENT Patient reports improvement in sleep quality without disturbance secondary to hip pain. She reports having some feeling of "swelling" in the region that is continuous. She demonstrates [facility] L hip ROM at this time, though she does intermittently have pain with  end-range ER/IR. Pt demonstrates significantly improved gait pattern compared to outset of PT with sound weight shift onto LLE during stance phase. Pt has had difficulty with blood pressure control with unsafe BP for exercise noted during phone call to clinic earlier today. BP was measured in clinic - in hypertensive range, but not contraindicative for light to moderate exercise. Discussed at length with pt continued monitoring of BP throughout the day and continued follow-up with MD regarding BP control. Patient has remaining deficits in hip strength, functional mobility, difficulty with stair negotiation, and L gluteal pain. Patient will benefit from continued skilled therapeutic intervention to address the above deficits as needed for improved function and QoL.        PT Short  Term Goals - 02/07/21 1542       PT SHORT TERM GOAL #1   Title Pt will be independent and 100% compliant with established HEP and activity modification as needed to augment PT intervention and prevent flare-up of hip pain as needed for best return to prior level of function.    Baseline 01/04/21: discussed palliative care and activity modification; initiating HEP next visit. 02/06/21: fair compliance with HEP    Time 3    Period Weeks    Status Partially Met    Target Date 02/14/21               PT Long Term Goals - 02/06/21 1735       PT LONG TERM GOAL #1   Title Patient will demonstrate improved function as evidenced by a score of 47 on FOTO measure for full participation in activities at home and in the community.    Baseline 01/04/21: FOTO 27.   02/06/21: FOTO 57    Time 8    Period Weeks    Status Achieved    Target Date 03/01/21      PT LONG TERM GOAL #2   Title Patient will have hip strength to 4+/5 or greater for all motions tested without reproduction of pain indicative of improved capacity for loading paraspinal/pelvic and gluteal mm as needed for performance of transferring, stair negotiation     Baseline 01/04/21: 4/5 hip flexors and abduction(seated), 4/5 quad and HS, weak hip ER.  02/06/21: Remaining weakness in quads and Hip ER    Time 8    Period Weeks    Status Partially Met    Target Date 03/01/21      PT LONG TERM GOAL #3   Title Patient will perform stair negotiaiton in clinic, ascending and descending 4 steps with unilateral handrail support and no loss of balance or buckling independently without net increase in hip/back pain as needed for accessing home and community    Baseline 01/04/21: Significant pain with stair negotiation. 02/07/21: Deferred    Time 8    Period Weeks    Status Deferred    Target Date 03/01/21      PT LONG TERM GOAL #4   Title Patient will have no increase in pain with functional hip ROM in all planes as needed for lower limb management during self-care tasks, car transfers, and bed mobility tasks    Baseline 01/04/21: Pain with end-range hip ER and IR.   02/07/21: No pain wth hip PROM/AROM at this time, will assess next visit for delayed-onset pain/soreness    Time 6    Period Weeks    Status Achieved    Target Date 02/15/21      PT LONG TERM GOAL #5   Title Patient will perform independent sit to stand from 19-20 inch chair with no UE support and no reproduction of hip pain indicative of improved ability to perform transferring    Baseline 01/04/21: difficulty and pain with sit to stand.   02/07/21: Performed IND without increase in pain with closed-chain upper limb extension to facilitate lifting of pelvis    Time 6    Period Weeks    Status Partially Met    Target Date 02/15/21                   Plan - 02/16/21 1243     Clinical Impression Statement Patient reports improvement in sleep quality without disturbance secondary to hip pain. She reports  having some feeling of "swelling" in the region that is continuous. She demonstrates [facility] L hip ROM at this time, though she does intermittently have pain with end-range ER/IR. Pt demonstrates  significantly improved gait pattern compared to outset of PT with sound weight shift onto LLE during stance phase. Pt has had difficulty with blood pressure control with unsafe BP for exercise noted during phone call to clinic earlier today. BP was measured in clinic - in hypertensive range, but not contraindicative for light to moderate exercise. Discussed at length with pt continued monitoring of BP throughout the day and continued follow-up with MD regarding BP control. Patient has remaining deficits in hip strength, functional mobility, difficulty with stair negotiation, and L gluteal pain. Patient will benefit from continued skilled therapeutic intervention to address the above deficits as needed for improved function and QoL.    Personal Factors and Comorbidities Education;Comorbidity 3+;Age;Time since onset of injury/illness/exacerbation    Comorbidities AAA, AVN, HTN, Hx of bilateral LE edema, connective tissue disorder, Type 2 DM    Examination-Activity Limitations Locomotion Level;Squat;Stairs;Stand;Transfers;Bed Mobility    Examination-Participation Restrictions Driving;Community Activity;Cleaning    Stability/Clinical Decision Making Evolving/Moderate complexity    Rehab Potential Fair    PT Frequency 2x / week    PT Duration 8 weeks    PT Treatment/Interventions ADLs/Self Care Home Management;Electrical Stimulation;Moist Heat;Gait training;Stair training;Functional mobility training;Neuromuscular re-education;Balance training;Therapeutic exercise;Therapeutic activities;Patient/family education;Manual techniques;Passive range of motion    PT Next Visit Plan Manual therapy for desensitization and soft tissue mobility of affected gluteal musculature, gentle stretching.. Progress with gluteal activation, progressive loading and gradually increasing emphasis on standing and functional activities/CKC drills as tolerated.    PT Home Exercise Plan Access Code [email or web address]    Consulted and Agree with  Plan of Care Patient             Patient will benefit from skilled therapeutic intervention in order to improve the following deficits and impairments:  Abnormal gait, Pain, Postural dysfunction, Decreased mobility, Decreased activity tolerance, Decreased endurance, Decreased range of motion, Decreased strength, Impaired flexibility, Difficulty walking  Visit Diagnosis: Pain in left hip  Difficulty in walking, not elsewhere classified  Muscle weakness (generalized)     Problem List Patient Active Problem List   Diagnosis Date Noted   Nausea 03/31/2020   Multiple thyroid nodules 12/28/2019   Thyroid nodule 09/29/2019   Lymphedema 11/10/2018   PAD (peripheral artery disease) ([place]) 10/12/2018   Aortic atherosclerosis ([place]) 10/12/2018   Carotid stenosis, asymptomatic, bilateral 10/12/2018   Positive colorectal cancer screening using Cologuard test 10/09/2018   Gastroesophageal reflux disease    Acute gastritis without hemorrhage    Osteoarthritis 04/29/2018   Hiatal hernia with GERD 04/29/2018   Hyperlipidemia 04/29/2018   Lymphedema of both lower extremities 04/29/2018   Left knee pain 10/23/2016   Cataracts, bilateral 07/31/2016   Glaucoma 07/31/2016   Lateral epicondylitis of left elbow 10/20/2015   Vitamin D deficiency 10/20/2015   Type 2 diabetes, controlled, with neuropathy ([place]) 10/16/2015   Gouty arthritis 06/03/2015   H/O syncope 06/03/2015   Mild intermittent asthma 06/03/2015   Multiple gastric ulcers 06/03/2015   Schatzki's ring 06/03/2015   Undifferentiated connective tissue disease ([place]) 06/03/2015   Bone disorder 04/05/2015   Dental injury 04/05/2015   Pericarditis 04/05/2015   Sjogren's syndrome ([place]) 09/07/2014   Elevated alkaline phosphatase level 08/24/2014   Lactose intolerance 08/24/2014   Obesity 08/24/2014   Peripheral edema 08/24/2014   Benign hypertension with CKD (chronic kidney disease) stage  III 08/03/2014   Abdominal adhesions  08/02/2014   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 [place], [place], [address] Phone: [phone]   Fax:  [phone]  Name: [name] MRN: [id number] Date of Birth: 12/16/1945

## 2021-02-16 ENCOUNTER — Encounter: Payer: Self-pay | Admitting: Physical Therapy

## 2021-02-16 ENCOUNTER — Encounter: Payer: Medicare Other | Admitting: Physical Therapy

## 2021-02-20 ENCOUNTER — Encounter: Payer: Self-pay | Admitting: Physical Therapy

## 2021-02-20 ENCOUNTER — Ambulatory Visit: Payer: Medicare Other | Admitting: Physical Therapy

## 2021-02-20 ENCOUNTER — Other Ambulatory Visit: Payer: Self-pay

## 2021-02-20 DIAGNOSIS — M6281 Muscle weakness (generalized): Secondary | ICD-10-CM

## 2021-02-20 DIAGNOSIS — M25552 Pain in left hip: Secondary | ICD-10-CM

## 2021-02-20 DIAGNOSIS — R262 Difficulty in walking, not elsewhere classified: Secondary | ICD-10-CM

## 2021-02-20 NOTE — Therapy (Signed)
[facility] [address] [place], [place], [address] Phone: [phone]   Fax:  [phone]  Physical Therapy Treatment  Patient Details  Name: [name] MRN: [id number] Date of Birth: 03/15/1946 Referring Provider (PT): Rolanda Jay [place], [place]   Encounter Date: 02/20/2021   PT End of Session - 02/20/21 1750     Visit Number 10    Number of Visits 17    Date for PT Re-Evaluation 03/01/21    Progress Note Due on Visit 17    PT Start Time 1718    PT Stop Time 1801    PT Time Calculation (min) 43 min    Activity Tolerance Patient tolerated treatment well;Patient limited by pain    Behavior During Therapy [facility] for tasks assessed/performed             Past Medical History:  Diagnosis Date   Anterolisthesis    Cervical spine   Asthma    Connective tissue disorder ([place])    recurrent carotid arteritis, temporal arteritis, vasculitis mandible, general hepatitis, avascular necrosis bilat   DDD (degenerative disc disease), lumbar    Diabetes mellitus without complication ([place])    Diverticulosis    Duodenitis    Ganglion cyst of right foot    Gouty arthritis    Hiatal hernia with GERD    History of cardiovascular stress test    a. 07/2018 [facility]): Fixed inferoapical defect w/ nl contraction-->attenuation artifact. No ischemia. EF 63%.   Hyperlipidemia    Hypertension    a. 02/2019 Renal artery duplex: no evidence of prox RAS.   Hyperuricemia    Keratitis sicca, bilateral (HCC)    Lymphedema of both lower extremities    Osteoarthritis    Pericarditis    Recurrent: Aug 97, July 05, Sept 08, July 16, July 18   PUD (peptic ulcer disease)    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[place])    Sjogren's syndrome ([place])    Syncope    Tendonitis, Achilles, right    Tortuous colon    Trigger finger     Past Surgical History:  Procedure Laterality Date   ABDOMINAL HYSTERECTOMY     BREAST BIOPSY     BREAST EXCISIONAL BIOPSY Left  1986   neg   CHOLECYSTECTOMY     COLONOSCOPY WITH PROPOFOL N/A 08/26/2018   Procedure: COLONOSCOPY WITH PROPOFOL;  Surgeon: Lucilla Lame, MD;  Location: [facility] ENDOSCOPY;  Service: Endoscopy;  Laterality: N/A;   CYSTOSCOPY  02/26/2018   [name], MD    distal arthrectomy     ESOPHAGOGASTRODUODENOSCOPY (EGD) WITH PROPOFOL N/A 08/26/2018   Procedure: ESOPHAGOGASTRODUODENOSCOPY (EGD) WITH PROPOFOL;  Surgeon: Lucilla Lame, MD;  Location: [facility] ENDOSCOPY;  Service: Endoscopy;  Laterality: N/A;   EXCISION NEUROMA     KNEE SURGERY Bilateral    1985, 2001, 11/2002, 12/2006   panhysterectomy  10/1983   SHOULDER SURGERY Right    reverse total arthroplasty w/ biceps tenodesis   TONSILLECTOMY AND ADENOIDECTOMY     TOTAL HIP ARTHROPLASTY Right 04/2007   WISDOM TOOTH EXTRACTION      There were no vitals filed for this visit.   Subjective Assessment - 02/20/21 1724     Subjective Patient reports BP has been better controlled recently. She reports both of her knees were bothering her after last session. She reports that she has experienced intermittent L hip pain with prolonged sitting on her sofa. She reports mild L hip pain at arrival to PT.  Pertinent History Patient is a 75 year old female with previous episode of PT in this clinic related to low back pain. Patient has current primary complaint of L hip pain. Patient reports she was going ot sit onto toilet and was going to sit and fell into sitting in uncontrolled manner with weight shifted onto her L hip (DOI: 12/08/2020). She states she had pain at the time that worsened more the following day. Patient reports history of bursitis in L hip with multiple episode since the mid 1980s. Patient reports pain affecting posteiror gluteal and L SI region. Pt has history of L AVN. Patient had radiographs this past Thursday - evidence of degenerative changes and known lumbar DDD. Patient reports 4/10 presently. Patient reports 10/10 pain at the worst. Patient  reports no pain lower than 4/10. Patient denies numbness and tingling. Patient reports lower pain if she doesn't "use it." Aggravating factors: walking, more weightbearing, stair ascent>descent.. Easing factors: resting it, "not doing anything." Hx of R rTSA, R THA, L partial knee replacement. Goal: get rid of pain. She reports pain with every step of walking.    Limitations Walking;Standing;House hold activities    Patient Stated Goals Decrease back pain/ improve strength/ mobility.    Pain Onset --   30 years               TREATMENT PERFORMED     NuStep x 5 minutes for improved soft tissue extensibility/mobility prior to manual therapy and exercise; Level 3, seat 11 - unbilled time     Manual Therapy - for symptom modulation, soft tissue sensitivity and mobility, joint mobility, ROM   In sidelying, STM and Hypervolt along L gluteus medius and minimus DTM/Tpr at gluteus medius Long-axis distraction, gr II for pain relief L Hip PROM within pt tolerance     *not today* Atttempted use of Mulligan distraction, poor tolerance of belt on adductors with folded towel to cushion belt        Therapeutic Exercise - for improved soft tissue mobility, lumbopelvic/hip mobility, isometrics for analgesic effect   Sidelying Hip Abduction; 2x8 Supine Bridge with Blue Tband Abduction; 2x10 3-way hip, standing adjacent to table; x8 ea dir, bilat    *not today* Supine piriformis stretch; 2x30sec Sit to stand, raised table; 2x8 Lower trunk rotations; x10 each direction Sidelying clamshell, sidelying R; x10 with light manual resistance and emphasis on eccentrics Hip ADD isometrics, with belt and ball; 2x10, 5 sec Hip ABD and ADD isometrics, with belt and ball; x10, 5 sec           ASSESSMENT Patient has fleeting pain with end-range hip ER only. She has relatively mild hip pain at this time compared to outset of PT. She does report pain in bilateral knees and transient increase in L hip  pain after last visit. Held on significant closed-chain loading today (only CKC isometric loading with stance leg of 3-way hip). She reports feeling better at the end of the session today. Pt does require frequent re-orienting to activity/exercise at hand. Patient has remaining deficits in hip strength, functional mobility, difficulty with stair negotiation, and L gluteal pain. Patient will benefit from continued skilled therapeutic intervention to address the above deficits as needed for improved function and QoL.           PT Short Term Goals - 02/07/21 1542       PT SHORT TERM GOAL #1   Title Pt will be independent and 100% compliant with established HEP and activity modification  as needed to augment PT intervention and prevent flare-up of hip pain as needed for best return to prior level of function.    Baseline 01/04/21: discussed palliative care and activity modification; initiating HEP next visit. 02/06/21: fair compliance with HEP    Time 3    Period Weeks    Status Partially Met    Target Date 02/14/21               PT Long Term Goals - 02/06/21 1735       PT LONG TERM GOAL #1   Title Patient will demonstrate improved function as evidenced by a score of 47 on FOTO measure for full participation in activities at home and in the community.    Baseline 01/04/21: FOTO 27.   02/06/21: FOTO 57    Time 8    Period Weeks    Status Achieved    Target Date 03/01/21      PT LONG TERM GOAL #2   Title Patient will have hip strength to 4+/5 or greater for all motions tested without reproduction of pain indicative of improved capacity for loading paraspinal/pelvic and gluteal mm as needed for performance of transferring, stair negotiation    Baseline 01/04/21: 4/5 hip flexors and abduction(seated), 4/5 quad and HS, weak hip ER.  02/06/21: Remaining weakness in quads and Hip ER    Time 8    Period Weeks    Status Partially Met    Target Date 03/01/21      PT LONG TERM GOAL #3   Title  Patient will perform stair negotiaiton in clinic, ascending and descending 4 steps with unilateral handrail support and no loss of balance or buckling independently without net increase in hip/back pain as needed for accessing home and community    Baseline 01/04/21: Significant pain with stair negotiation. 02/07/21: Deferred    Time 8    Period Weeks    Status Deferred    Target Date 03/01/21      PT LONG TERM GOAL #4   Title Patient will have no increase in pain with functional hip ROM in all planes as needed for lower limb management during self-care tasks, car transfers, and bed mobility tasks    Baseline 01/04/21: Pain with end-range hip ER and IR.   02/07/21: No pain wth hip PROM/AROM at this time, will assess next visit for delayed-onset pain/soreness    Time 6    Period Weeks    Status Achieved    Target Date 02/15/21      PT LONG TERM GOAL #5   Title Patient will perform independent sit to stand from 19-20 inch chair with no UE support and no reproduction of hip pain indicative of improved ability to perform transferring    Baseline 01/04/21: difficulty and pain with sit to stand.   02/07/21: Performed IND without increase in pain with closed-chain upper limb extension to facilitate lifting of pelvis    Time 6    Period Weeks    Status Partially Met    Target Date 02/15/21                   Plan - 02/20/21 1846     Clinical Impression Statement Patient has fleeting pain with end-range hip ER only. She has relatively mild hip pain at this time compared to outset of PT. She does report pain in bilateral knees and transient increase in L hip pain after last visit. Held on significant closed-chain loading today (  only CKC isometric loading with stance leg of 3-way hip). She reports feeling better at the end of the session today. Pt does require frequent re-orienting to activity/exercise at hand. Patient has remaining deficits in hip strength, functional mobility, difficulty with  stair negotiation, and L gluteal pain. Patient will benefit from continued skilled therapeutic intervention to address the above deficits as needed for improved function and QoL.    Personal Factors and Comorbidities Education;Comorbidity 3+;Age;Time since onset of injury/illness/exacerbation    Comorbidities AAA, AVN, HTN, Hx of bilateral LE edema, connective tissue disorder, Type 2 DM    Examination-Activity Limitations Locomotion Level;Squat;Stairs;Stand;Transfers;Bed Mobility    Examination-Participation Restrictions Driving;Community Activity;Cleaning    Stability/Clinical Decision Making Evolving/Moderate complexity    Rehab Potential Fair    PT Frequency 2x / week    PT Duration 8 weeks    PT Treatment/Interventions ADLs/Self Care Home Management;Electrical Stimulation;Moist Heat;Gait training;Stair training;Functional mobility training;Neuromuscular re-education;Balance training;Therapeutic exercise;Therapeutic activities;Patient/family education;Manual techniques;Passive range of motion    PT Next Visit Plan Manual therapy for desensitization and soft tissue mobility of affected gluteal musculature, gentle stretching.. Progress with gluteal activation, progressive loading and gradually increasing emphasis on standing and functional activities/CKC drills as tolerated.    PT Home Exercise Plan Access Code [email or web address]    Consulted and Agree with Plan of Care Patient             Patient will benefit from skilled therapeutic intervention in order to improve the following deficits and impairments:  Abnormal gait, Pain, Postural dysfunction, Decreased mobility, Decreased activity tolerance, Decreased endurance, Decreased range of motion, Decreased strength, Impaired flexibility, Difficulty walking  Visit Diagnosis: Pain in left hip  Difficulty in walking, not elsewhere classified  Muscle weakness (generalized)     Problem List Patient Active Problem List   Diagnosis Date Noted    Nausea 03/31/2020   Multiple thyroid nodules 12/28/2019   Thyroid nodule 09/29/2019   Lymphedema 11/10/2018   PAD (peripheral artery disease) ([place]) 10/12/2018   Aortic atherosclerosis ([place]) 10/12/2018   Carotid stenosis, asymptomatic, bilateral 10/12/2018   Positive colorectal cancer screening using Cologuard test 10/09/2018   Gastroesophageal reflux disease    Acute gastritis without hemorrhage    Osteoarthritis 04/29/2018   Hiatal hernia with GERD 04/29/2018   Hyperlipidemia 04/29/2018   Lymphedema of both lower extremities 04/29/2018   Left knee pain 10/23/2016   Cataracts, bilateral 07/31/2016   Glaucoma 07/31/2016   Lateral epicondylitis of left elbow 10/20/2015   Vitamin D deficiency 10/20/2015   Type 2 diabetes, controlled, with neuropathy ([place]) 10/16/2015   Gouty arthritis 06/03/2015   H/O syncope 06/03/2015   Mild intermittent asthma 06/03/2015   Multiple gastric ulcers 06/03/2015   Schatzki's ring 06/03/2015   Undifferentiated connective tissue disease ([place]) 06/03/2015   Bone disorder 04/05/2015   Dental injury 04/05/2015   Pericarditis 04/05/2015   Sjogren's syndrome ([place]) 09/07/2014   Elevated alkaline phosphatase level 08/24/2014   Lactose intolerance 08/24/2014   Obesity 08/24/2014   Peripheral edema 08/24/2014   Benign hypertension with CKD (chronic kidney disease) stage III 08/03/2014   Abdominal adhesions 08/02/2014   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 [address] [place], [place], [address] Phone: [phone]   Fax:  [phone]  Name: [name] MRN: [id number] Date of Birth: 05-09-1946

## 2021-02-21 ENCOUNTER — Encounter: Payer: Medicare Other | Admitting: Physical Therapy

## 2021-02-22 ENCOUNTER — Other Ambulatory Visit: Payer: Self-pay

## 2021-02-22 ENCOUNTER — Ambulatory Visit: Payer: Medicare Other | Admitting: Physical Therapy

## 2021-02-22 DIAGNOSIS — R262 Difficulty in walking, not elsewhere classified: Secondary | ICD-10-CM

## 2021-02-22 DIAGNOSIS — M25552 Pain in left hip: Secondary | ICD-10-CM

## 2021-02-22 DIAGNOSIS — M6281 Muscle weakness (generalized): Secondary | ICD-10-CM

## 2021-02-22 NOTE — Therapy (Signed)
[facility] [address] [place], [place], [address] Phone: [phone]   Fax:  [phone]  Physical Therapy Treatment  Patient Details  Name: [name] MRN: [id number] Date of Birth: Dec 28, 1945 Referring Provider (PT): Rolanda Jay [place], [place]   Encounter Date: 02/22/2021   PT End of Session - 02/22/21 1718     Visit Number 11    Number of Visits 17    Date for PT Re-Evaluation 03/08/21    Authorization Time Period last prog note 8/29    Progress Note Due on Visit 17    PT Start Time 1716    PT Stop Time 1800    PT Time Calculation (min) 44 min    Activity Tolerance Patient tolerated treatment well;Patient limited by pain    Behavior During Therapy [facility] for tasks assessed/performed             Past Medical History:  Diagnosis Date   Anterolisthesis    Cervical spine   Asthma    Connective tissue disorder ([place])    recurrent carotid arteritis, temporal arteritis, vasculitis mandible, general hepatitis, avascular necrosis bilat   DDD (degenerative disc disease), lumbar    Diabetes mellitus without complication ([place])    Diverticulosis    Duodenitis    Ganglion cyst of right foot    Gouty arthritis    Hiatal hernia with GERD    History of cardiovascular stress test    a. 07/2018 [facility]): Fixed inferoapical defect w/ nl contraction-->attenuation artifact. No ischemia. EF 63%.   Hyperlipidemia    Hypertension    a. 02/2019 Renal artery duplex: no evidence of prox RAS.   Hyperuricemia    Keratitis sicca, bilateral (HCC)    Lymphedema of both lower extremities    Osteoarthritis    Pericarditis    Recurrent: Aug 97, July 05, Sept 08, July 16, July 18   PUD (peptic ulcer disease)    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[place])    Sjogren's syndrome ([place])    Syncope    Tendonitis, Achilles, right    Tortuous colon    Trigger finger     Past Surgical History:  Procedure Laterality Date   ABDOMINAL HYSTERECTOMY      BREAST BIOPSY     BREAST EXCISIONAL BIOPSY Left 1986   neg   CHOLECYSTECTOMY     COLONOSCOPY WITH PROPOFOL N/A 08/26/2018   Procedure: COLONOSCOPY WITH PROPOFOL;  Surgeon: Lucilla Lame, MD;  Location: [facility] ENDOSCOPY;  Service: Endoscopy;  Laterality: N/A;   CYSTOSCOPY  02/26/2018   [name], MD    distal arthrectomy     ESOPHAGOGASTRODUODENOSCOPY (EGD) WITH PROPOFOL N/A 08/26/2018   Procedure: ESOPHAGOGASTRODUODENOSCOPY (EGD) WITH PROPOFOL;  Surgeon: Lucilla Lame, MD;  Location: [facility] ENDOSCOPY;  Service: Endoscopy;  Laterality: N/A;   EXCISION NEUROMA     KNEE SURGERY Bilateral    1985, 2001, 11/2002, 12/2006   panhysterectomy  10/1983   SHOULDER SURGERY Right    reverse total arthroplasty w/ biceps tenodesis   TONSILLECTOMY AND ADENOIDECTOMY     TOTAL HIP ARTHROPLASTY Right 04/2007   WISDOM TOOTH EXTRACTION      There were no vitals filed for this visit.   Subjective Assessment - 02/22/21 1720     Subjective Patient reports her BP is "about the same," but she hasn't had increase in SBP up to 200. She states SBP has been around 150. Patient reports her hip is not hurting remarkably today. Patient reports ongoing R significant  R kne pain. Patient reports mild L gluteal pain at arrival to PT. Pt reports no pain with BM.    Pertinent History Patient is a 75 year old female with previous episode of PT in this clinic related to low back pain. Patient has current primary complaint of L hip pain. Patient reports she was going ot sit onto toilet and was going to sit and fell into sitting in uncontrolled manner with weight shifted onto her L hip (DOI: 12/08/2020). She states she had pain at the time that worsened more the following day. Patient reports history of bursitis in L hip with multiple episode since the mid 1980s. Patient reports pain affecting posteiror gluteal and L SI region. Pt has history of L AVN. Patient had radiographs this past Thursday - evidence of degenerative changes and  known lumbar DDD. Patient reports 4/10 presently. Patient reports 10/10 pain at the worst. Patient reports no pain lower than 4/10. Patient denies numbness and tingling. Patient reports lower pain if she doesn't "use it." Aggravating factors: walking, more weightbearing, stair ascent>descent.. Easing factors: resting it, "not doing anything." Hx of R rTSA, R THA, L partial knee replacement. Goal: get rid of pain. She reports pain with every step of walking.    Limitations Walking;Standing;House hold activities    Patient Stated Goals Decrease back pain/ improve strength/ mobility.    Pain Onset --   30 years                  TREATMENT PERFORMED     NuStep x 5 minutes for improved soft tissue extensibility/mobility prior to manual therapy and exercise; Level 3, seat 11 - unbilled time     Manual Therapy - for symptom modulation, soft tissue sensitivity and mobility, joint mobility, ROM   In sidelying, STM and Hypervolt along L gluteus medius and minimus DTM/Tpr at gluteus medius Long-axis distraction LLE, gr II for pain relief L Hip PROM within pt tolerance      *not today* Atttempted use of Mulligan distraction, poor tolerance of belt on adductors with folded towel to cushion belt        Therapeutic Exercise - for improved soft tissue mobility, lumbopelvic/hip mobility, isometrics for analgesic effect   Sidelying Hip Abduction; 2x8 Supine Bridge with Blue Tband Abduction; 2x10 3-way hip, standing adjacent to table; x8 ea dir, bilat     *not today* Supine piriformis stretch; 2x30sec Sit to stand, raised table; 2x8 Lower trunk rotations; x10 each direction Sidelying clamshell, sidelying R; x10 with light manual resistance and emphasis on eccentrics Hip ADD isometrics, with belt and ball; 2x10, 5 sec Hip ABD and ADD isometrics, with belt and ball; x10, 5 sec           ASSESSMENT Patient does not have significant pain with end-range hip flexion or ER/IR today. She  has mild pain this afternoon and has recently reported improved disturbed sleep secondary to gluteal pain. 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 She is limited in performance of CKC drills due to comorbid R knee pain with history of multiple R knee orthopedic procedures. Pt does require frequent re-orienting to activity/exercise at hand. Patient has remaining deficits in hip strength, functional mobility, difficulty with stair negotiation, and L gluteal pain. Patient will benefit from continued skilled therapeutic intervention to address the above deficits as needed for improved function and  QoL.          PT Short Term Goals - 02/07/21 1542       PT SHORT TERM GOAL #1   Title Pt will be independent and 100% compliant with established HEP and activity modification as needed to augment PT intervention and prevent flare-up of hip pain as needed for best return to prior level of function.    Baseline 01/04/21: discussed palliative care and activity modification; initiating HEP next visit. 02/06/21: fair compliance with HEP    Time 3    Period Weeks    Status Partially Met    Target Date 02/14/21               PT Long Term Goals - 02/06/21 1735       PT LONG TERM GOAL #1   Title Patient will demonstrate improved function as evidenced by a score of 47 on FOTO measure for full participation in activities at home and in the community.    Baseline 01/04/21: FOTO 27.   02/06/21: FOTO 57    Time 8    Period Weeks    Status Achieved    Target Date 03/01/21      PT LONG TERM GOAL #2   Title Patient will have hip strength to 4+/5 or greater for all motions tested without reproduction of pain indicative of improved capacity for loading paraspinal/pelvic and gluteal mm as needed for performance of transferring, stair  negotiation    Baseline 01/04/21: 4/5 hip flexors and abduction(seated), 4/5 quad and HS, weak hip ER.  02/06/21: Remaining weakness in quads and Hip ER    Time 8    Period Weeks    Status Partially Met    Target Date 03/01/21      PT LONG TERM GOAL #3   Title Patient will perform stair negotiaiton in clinic, ascending and descending 4 steps with unilateral handrail support and no loss of balance or buckling independently without net increase in hip/back pain as needed for accessing home and community    Baseline 01/04/21: Significant pain with stair negotiation. 02/07/21: Deferred    Time 8    Period Weeks    Status Deferred    Target Date 03/01/21      PT LONG TERM GOAL #4   Title Patient will have no increase in pain with functional hip ROM in all planes as needed for lower limb management during self-care tasks, car transfers, and bed mobility tasks    Baseline 01/04/21: Pain with end-range hip ER and IR.   02/07/21: No pain wth hip PROM/AROM at this time, will assess next visit for delayed-onset pain/soreness    Time 6    Period Weeks    Status Achieved    Target Date 02/15/21      PT LONG TERM GOAL #5   Title Patient will perform independent sit to stand from 19-20 inch chair with no UE support and no reproduction of hip pain indicative of improved ability to perform transferring    Baseline 01/04/21: difficulty and pain with sit to stand.   02/07/21: Performed IND without increase in pain with closed-chain upper limb extension to facilitate lifting of pelvis    Time 6    Period Weeks    Status Partially Met    Target Date 02/15/21                   Plan - 02/23/21 0849     Clinical Impression Statement Patient does  not have significant pain with end-range hip flexion or ER/IR today. She has mild pain this afternoon and has recently reported improved disturbed sleep secondary to gluteal pain. 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 She is limited in performance of CKC drills due to comorbid R knee pain with history of multiple R knee orthopedic procedures. Pt does require frequent re-orienting to activity/exercise at hand. Patient has remaining deficits in hip strength, functional mobility, difficulty with stair negotiation, and L gluteal pain. Patient will benefit from continued skilled therapeutic intervention to address the above deficits as needed for improved function and QoL.    Personal Factors and Comorbidities Education;Comorbidity 3+;Age;Time since onset of injury/illness/exacerbation    Comorbidities AAA, AVN, HTN, Hx of bilateral LE edema, connective tissue disorder, Type 2 DM    Examination-Activity Limitations Locomotion Level;Squat;Stairs;Stand;Transfers;Bed Mobility    Examination-Participation Restrictions Driving;Community Activity;Cleaning    Stability/Clinical Decision Making Evolving/Moderate complexity    Rehab Potential Fair    PT Frequency 2x / week    PT Duration 8 weeks    PT Treatment/Interventions ADLs/Self Care Home Management;Electrical Stimulation;Moist Heat;Gait training;Stair training;Functional mobility training;Neuromuscular re-education;Balance training;Therapeutic exercise;Therapeutic activities;Patient/family education;Manual techniques;Passive range of motion    PT Next Visit Plan Manual therapy for desensitization and soft tissue mobility of affected gluteal musculature, gentle stretching.. Progress with gluteal activation, progressive loading and gradually increasing emphasis on standing and functional activities/CKC drills as tolerated.    PT Home Exercise Plan Access Code [email or web address]    Consulted and Agree with Plan of Care Patient             Patient will benefit from skilled therapeutic intervention in order to improve the following deficits and  impairments:  Abnormal gait, Pain, Postural dysfunction, Decreased mobility, Decreased activity tolerance, Decreased endurance, Decreased range of motion, Decreased strength, Impaired flexibility, Difficulty walking  Visit Diagnosis: Pain in left hip  Difficulty in walking, not elsewhere classified  Muscle weakness (generalized)     Problem List Patient Active Problem List   Diagnosis Date Noted   Nausea 03/31/2020   Multiple thyroid nodules 12/28/2019   Thyroid nodule 09/29/2019   Lymphedema 11/10/2018   PAD (peripheral artery disease) ([place]) 10/12/2018   Aortic atherosclerosis ([place]) 10/12/2018   Carotid stenosis, asymptomatic, bilateral 10/12/2018   Positive colorectal cancer screening using Cologuard test 10/09/2018   Gastroesophageal reflux disease    Acute gastritis without hemorrhage    Osteoarthritis 04/29/2018   Hiatal hernia with GERD 04/29/2018   Hyperlipidemia 04/29/2018   Lymphedema of both lower extremities 04/29/2018   Left knee pain 10/23/2016   Cataracts, bilateral 07/31/2016   Glaucoma 07/31/2016   Lateral epicondylitis of left elbow 10/20/2015   Vitamin D deficiency 10/20/2015   Type 2 diabetes, controlled, with neuropathy ([place]) 10/16/2015   Gouty arthritis 06/03/2015   H/O syncope 06/03/2015   Mild intermittent asthma 06/03/2015   Multiple gastric ulcers 06/03/2015   Schatzki's ring 06/03/2015   Undifferentiated connective tissue disease ([place]) 06/03/2015   Bone disorder 04/05/2015   Dental injury 04/05/2015   Pericarditis 04/05/2015   Sjogren's syndrome ([place]) 09/07/2014   Elevated alkaline phosphatase level 08/24/2014   Lactose intolerance 08/24/2014   Obesity 08/24/2014   Peripheral edema 08/24/2014   Benign hypertension with CKD (chronic kidney disease) stage III 08/03/2014   Abdominal adhesions 08/02/2014   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  [place]) 08/02/2014   Degenerative joint disease (DJD) of lumbar spine 08/02/2014   Diverticulosis of  both small and large intestine 08/02/2014   Hyperuricemia 08/02/2014   Pure hypercholesterolemia 08/02/2014   Trigger finger 08/02/2014   Right shoulder pain 07/29/2014   Benign neoplasm of colon 06/30/2010   Right upper quadrant pain 06/30/2010   Valentina Gu, PT, DPT #[id number]  Eilleen Kempf, PT 02/23/2021, 8:50 AM  [place] [facility] [address] [place], [place], [address] Phone: [phone]   Fax:  [phone]  Name: Courtney Bellizzi MRN: [id number] Date of Birth: 07-15-1945

## 2021-02-23 ENCOUNTER — Encounter: Payer: Self-pay | Admitting: Physical Therapy

## 2021-02-23 ENCOUNTER — Encounter: Payer: Medicare Other | Admitting: Physical Therapy

## 2021-02-27 ENCOUNTER — Ambulatory Visit: Payer: Medicare Other | Admitting: Physical Therapy

## 2021-02-28 ENCOUNTER — Encounter: Payer: Medicare Other | Admitting: Physical Therapy

## 2021-03-01 ENCOUNTER — Other Ambulatory Visit: Payer: Self-pay

## 2021-03-01 ENCOUNTER — Ambulatory Visit: Payer: Medicare Other | Admitting: Physical Therapy

## 2021-03-01 ENCOUNTER — Encounter: Payer: Self-pay | Admitting: Physical Therapy

## 2021-03-01 DIAGNOSIS — M25661 Stiffness of right knee, not elsewhere classified: Secondary | ICD-10-CM

## 2021-03-01 DIAGNOSIS — M6281 Muscle weakness (generalized): Secondary | ICD-10-CM

## 2021-03-01 DIAGNOSIS — M25552 Pain in left hip: Secondary | ICD-10-CM

## 2021-03-01 DIAGNOSIS — R262 Difficulty in walking, not elsewhere classified: Secondary | ICD-10-CM

## 2021-03-01 DIAGNOSIS — M545 Low back pain, unspecified: Secondary | ICD-10-CM

## 2021-03-01 DIAGNOSIS — M25561 Pain in right knee: Secondary | ICD-10-CM

## 2021-03-01 DIAGNOSIS — G8929 Other chronic pain: Secondary | ICD-10-CM

## 2021-03-01 NOTE — Therapy (Signed)
[facility] [address] [place], [place], [address] Phone: [phone]   Fax:  [phone]  Physical Therapy Treatment  Patient Details  Name: [name] MRN: [id number] Date of Birth: August 10, 1945 Referring Provider (PT): Rolanda Jay [place], [place]   Encounter Date: 03/01/2021   PT End of Session - 03/01/21 1713     Visit Number 12    Number of Visits 17    Date for PT Re-Evaluation 03/08/21    Authorization Time Period last prog note 8/29    Progress Note Due on Visit 17    PT Start Time 1630    PT Stop Time 0932    PT Time Calculation (min) 45 min    Activity Tolerance Patient tolerated treatment well;Patient limited by pain    Behavior During Therapy [facility] for tasks assessed/performed             Past Medical History:  Diagnosis Date   Anterolisthesis    Cervical spine   Asthma    Connective tissue disorder ([place])    recurrent carotid arteritis, temporal arteritis, vasculitis mandible, general hepatitis, avascular necrosis bilat   DDD (degenerative disc disease), lumbar    Diabetes mellitus without complication ([place])    Diverticulosis    Duodenitis    Ganglion cyst of right foot    Gouty arthritis    Hiatal hernia with GERD    History of cardiovascular stress test    a. 07/2018 [facility]): Fixed inferoapical defect w/ nl contraction-->attenuation artifact. No ischemia. EF 63%.   Hyperlipidemia    Hypertension    a. 02/2019 Renal artery duplex: no evidence of prox RAS.   Hyperuricemia    Keratitis sicca, bilateral (HCC)    Lymphedema of both lower extremities    Osteoarthritis    Pericarditis    Recurrent: Aug 97, July 05, Sept 08, July 16, July 18   PUD (peptic ulcer disease)    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[place])    Sjogren's syndrome ([place])    Syncope    Tendonitis, Achilles, right    Tortuous colon    Trigger finger     Past Surgical History:  Procedure Laterality Date   ABDOMINAL HYSTERECTOMY      BREAST BIOPSY     BREAST EXCISIONAL BIOPSY Left 1986   neg   CHOLECYSTECTOMY     COLONOSCOPY WITH PROPOFOL N/A 08/26/2018   Procedure: COLONOSCOPY WITH PROPOFOL;  Surgeon: Lucilla Lame, MD;  Location: [facility] ENDOSCOPY;  Service: Endoscopy;  Laterality: N/A;   CYSTOSCOPY  02/26/2018   [name], MD    distal arthrectomy     ESOPHAGOGASTRODUODENOSCOPY (EGD) WITH PROPOFOL N/A 08/26/2018   Procedure: ESOPHAGOGASTRODUODENOSCOPY (EGD) WITH PROPOFOL;  Surgeon: Lucilla Lame, MD;  Location: [facility] ENDOSCOPY;  Service: Endoscopy;  Laterality: N/A;   EXCISION NEUROMA     KNEE SURGERY Bilateral    1985, 2001, 11/2002, 12/2006   panhysterectomy  10/1983   SHOULDER SURGERY Right    reverse total arthroplasty w/ biceps tenodesis   TONSILLECTOMY AND ADENOIDECTOMY     TOTAL HIP ARTHROPLASTY Right 04/2007   WISDOM TOOTH EXTRACTION      There were no vitals filed for this visit.   Subjective Assessment - 03/01/21 1638     Subjective Patient reports her BP is "about the same," but she hasn't had increase in SBP up to 200. She states SBP has been around 150. Patient reports her hip "was bad" yesterday. Patient reports ongoing R knee pain currently  3/10 however it incresaes to 8/10 occasionally. Patient reports mild L gluteal pain.    Pertinent History Patient is a 75 year old female with previous episode of PT in this clinic related to low back pain. Patient has current primary complaint of L hip pain. Patient reports she was going ot sit onto toilet and was going to sit and fell into sitting in uncontrolled manner with weight shifted onto her L hip (DOI: 12/08/2020). She states she had pain at the time that worsened more the following day. Patient reports history of bursitis in L hip with multiple episode since the mid 1980s. Patient reports pain affecting posteiror gluteal and L SI region. Pt has history of L AVN. Patient had radiographs this past Thursday - evidence of degenerative changes and known lumbar DDD.  Patient reports 4/10 presently. Patient reports 10/10 pain at the worst. Patient reports no pain lower than 4/10. Patient denies numbness and tingling. Patient reports lower pain if she doesn't "use it." Aggravating factors: walking, more weightbearing, stair ascent>descent.. Easing factors: resting it, "not doing anything." Hx of R rTSA, R THA, L partial knee replacement. Goal: get rid of pain. She reports pain with every step of walking.    Limitations Walking;Standing;House hold activities    Patient Stated Goals Decrease back pain/ improve strength/ mobility.    Currently in Pain? Yes    Pain Score 3     Pain Location Knee    Pain Orientation Right    Pain Onset --   30 years                 TREATMENT PERFORMED     NuStep x 6 minutes for improved soft tissue extensibility/mobility prior to manual therapy and exercise; Level 3, seat 11 - unbilled time     Manual Therapy - for symptom modulation, soft tissue sensitivity and mobility, joint mobility, ROM   In sidelying, IASTM using Theraband roller to L gluteus medius and minimus DTM/Tpr at gluteus medius; poor pt tolerance due to sensitivity with palpation  Long-axis distraction LLE, gr II for pain relief L Hip PROM within pt tolerance      *not today* Atttempted use of Mulligan distraction, poor tolerance of belt on adductors with folded towel to cushion belt        Therapeutic Exercise - for improved soft tissue mobility, lumbopelvic/hip mobility, isometrics for analgesic effect   Sidelying Hip Abduction; 2x8 Supine Bridge with Blue Tband Abduction; 2x10     *not today* Supine piriformis stretch; 2x30sec Sit to stand, raised table; 2x8 Lower trunk rotations; x10 each direction Sidelying clamshell, sidelying R; x10 with light manual resistance and emphasis on eccentrics Hip ADD isometrics, with belt and ball; 2x10, 5 sec Hip ABD and ADD isometrics, with belt and ball; x10, 5 sec 3-way hip, standing adjacent to  table; x8 ea dir, bilat         ASSESSMENT Patient does not have pain with any planes of movement during PROM to left hip. She did have significant pain with mild to moderate pressure palpation to insertion point of gluteus medius. She does experience pain during transitions from supine<>sit and rolling from supine<>side lying L and R. Strengthening was continued without progressions this session due to fatigue and pain with movement. Pt does require frequent re-orienting to activity/exercise at hand. Patient has remaining deficits in hip strength, functional mobility, difficulty with stair negotiation, and L gluteal pain. Patient will benefit from continued skilled therapeutic intervention to address the above deficits as  needed for improved function and QoL.             PT Short Term Goals - 02/07/21 1542       PT SHORT TERM GOAL #1   Title Pt will be independent and 100% compliant with established HEP and activity modification as needed to augment PT intervention and prevent flare-up of hip pain as needed for best return to prior level of function.    Baseline 01/04/21: discussed palliative care and activity modification; initiating HEP next visit. 02/06/21: fair compliance with HEP    Time 3    Period Weeks    Status Partially Met    Target Date 02/14/21               PT Long Term Goals - 02/06/21 1735       PT LONG TERM GOAL #1   Title Patient will demonstrate improved function as evidenced by a score of 47 on FOTO measure for full participation in activities at home and in the community.    Baseline 01/04/21: FOTO 27.   02/06/21: FOTO 57    Time 8    Period Weeks    Status Achieved    Target Date 03/01/21      PT LONG TERM GOAL #2   Title Patient will have hip strength to 4+/5 or greater for all motions tested without reproduction of pain indicative of improved capacity for loading paraspinal/pelvic and gluteal mm as needed for performance of transferring, stair  negotiation    Baseline 01/04/21: 4/5 hip flexors and abduction(seated), 4/5 quad and HS, weak hip ER.  02/06/21: Remaining weakness in quads and Hip ER    Time 8    Period Weeks    Status Partially Met    Target Date 03/01/21      PT LONG TERM GOAL #3   Title Patient will perform stair negotiaiton in clinic, ascending and descending 4 steps with unilateral handrail support and no loss of balance or buckling independently without net increase in hip/back pain as needed for accessing home and community    Baseline 01/04/21: Significant pain with stair negotiation. 02/07/21: Deferred    Time 8    Period Weeks    Status Deferred    Target Date 03/01/21      PT LONG TERM GOAL #4   Title Patient will have no increase in pain with functional hip ROM in all planes as needed for lower limb management during self-care tasks, car transfers, and bed mobility tasks    Baseline 01/04/21: Pain with end-range hip ER and IR.   02/07/21: No pain wth hip PROM/AROM at this time, will assess next visit for delayed-onset pain/soreness    Time 6    Period Weeks    Status Achieved    Target Date 02/15/21      PT LONG TERM GOAL #5   Title Patient will perform independent sit to stand from 19-20 inch chair with no UE support and no reproduction of hip pain indicative of improved ability to perform transferring    Baseline 01/04/21: difficulty and pain with sit to stand.   02/07/21: Performed IND without increase in pain with closed-chain upper limb extension to facilitate lifting of pelvis    Time 6    Period Weeks    Status Partially Met    Target Date 02/15/21                   Plan - 03/01/21 1725  Clinical Impression Statement Patient does not have pain with any planes of movement during PROM to left hip. She did have significant pain with mild to moderate pressure palpation to insertion point of gluteus medius. She does experience pain during transitions from supine<>sit and rolling from  supine<>side lying L and R. Strengthening was continued without progressions this session due to fatigue and pain with movement. Pt does require frequent re-orienting to activity/exercise at hand. Patient has remaining deficits in hip strength, functional mobility, difficulty with stair negotiation, and L gluteal pain. Patient will benefit from continued skilled therapeutic intervention to address the above deficits as needed for improved function and QoL.    Personal Factors and Comorbidities Education;Comorbidity 3+;Age;Time since onset of injury/illness/exacerbation    Comorbidities AAA, AVN, HTN, Hx of bilateral LE edema, connective tissue disorder, Type 2 DM    Examination-Activity Limitations Locomotion Level;Squat;Stairs;Stand;Transfers;Bed Mobility    Examination-Participation Restrictions Driving;Community Activity;Cleaning    Stability/Clinical Decision Making Evolving/Moderate complexity    Rehab Potential Fair    PT Frequency 2x / week    PT Duration 8 weeks    PT Treatment/Interventions ADLs/Self Care Home Management;Electrical Stimulation;Moist Heat;Gait training;Stair training;Functional mobility training;Neuromuscular re-education;Balance training;Therapeutic exercise;Therapeutic activities;Patient/family education;Manual techniques;Passive range of motion    PT Next Visit Plan Manual therapy for desensitization and soft tissue mobility of affected gluteal musculature, gentle stretching.. Progress with gluteal activation, progressive loading and gradually increasing emphasis on standing and functional activities/CKC drills as tolerated.    PT Home Exercise Plan Access Code [email or web address]    Consulted and Agree with Plan of Care Patient             Patient will benefit from skilled therapeutic intervention in order to improve the following deficits and impairments:  Abnormal gait, Pain, Postural dysfunction, Decreased mobility, Decreased activity tolerance, Decreased endurance, Decreased  range of motion, Decreased strength, Impaired flexibility, Difficulty walking  Visit Diagnosis: Pain in left hip  Difficulty in walking, not elsewhere classified  Muscle weakness (generalized)  Decreased range of motion of right knee  Right knee pain, unspecified chronicity  Chronic bilateral low back pain without sciatica     Problem List Patient Active Problem List   Diagnosis Date Noted   Nausea 03/31/2020   Multiple thyroid nodules 12/28/2019   Thyroid nodule 09/29/2019   Lymphedema 11/10/2018   PAD (peripheral artery disease) ([place]) 10/12/2018   Aortic atherosclerosis ([place]) 10/12/2018   Carotid stenosis, asymptomatic, bilateral 10/12/2018   Positive colorectal cancer screening using Cologuard test 10/09/2018   Gastroesophageal reflux disease    Acute gastritis without hemorrhage    Osteoarthritis 04/29/2018   Hiatal hernia with GERD 04/29/2018   Hyperlipidemia 04/29/2018   Lymphedema of both lower extremities 04/29/2018   Left knee pain 10/23/2016   Cataracts, bilateral 07/31/2016   Glaucoma 07/31/2016   Lateral epicondylitis of left elbow 10/20/2015   Vitamin D deficiency 10/20/2015   Type 2 diabetes, controlled, with neuropathy ([place]) 10/16/2015   Gouty arthritis 06/03/2015   H/O syncope 06/03/2015   Mild intermittent asthma 06/03/2015   Multiple gastric ulcers 06/03/2015   Schatzki's ring 06/03/2015   Undifferentiated connective tissue disease ([place]) 06/03/2015   Bone disorder 04/05/2015   Dental injury 04/05/2015   Pericarditis 04/05/2015   Sjogren's syndrome ([place]) 09/07/2014   Elevated alkaline phosphatase level 08/24/2014   Lactose intolerance 08/24/2014   Obesity 08/24/2014   Peripheral edema 08/24/2014   Benign hypertension with CKD (chronic kidney disease) stage III 08/03/2014   Abdominal adhesions 08/02/2014   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 [place], [place], [address] Phone: [phone]   Fax:  [phone]  Name: [name] MRN: [id number] Date of Birth: [birth year]

## 2021-03-02 ENCOUNTER — Encounter: Payer: Medicare Other | Admitting: Physical Therapy

## 2021-03-06 ENCOUNTER — Encounter: Payer: Self-pay | Admitting: Physical Therapy

## 2021-03-06 ENCOUNTER — Ambulatory Visit: Payer: Medicare Other | Admitting: Physical Therapy

## 2021-03-06 ENCOUNTER — Other Ambulatory Visit: Payer: Self-pay

## 2021-03-06 DIAGNOSIS — M25552 Pain in left hip: Secondary | ICD-10-CM | POA: Diagnosis not present

## 2021-03-06 DIAGNOSIS — M6281 Muscle weakness (generalized): Secondary | ICD-10-CM

## 2021-03-06 DIAGNOSIS — R262 Difficulty in walking, not elsewhere classified: Secondary | ICD-10-CM

## 2021-03-06 NOTE — Therapy (Signed)
[facility] [address]. [place], [place], [address] Phone: [phone]   Fax:  [phone]  Physical Therapy Treatment  Patient Details  Name: [name] MRN: [id number] Date of Birth: Jul 25, 1945 Referring Provider (PT): Rolanda Jay [place], [place]   Encounter Date: 03/06/2021   PT End of Session - 03/06/21 1601     Visit Number 13    Number of Visits 17    Date for PT Re-Evaluation 03/08/21    Authorization Time Period last prog note 8/29    Progress Note Due on Visit 17    PT Start Time 1552    PT Stop Time 1633    PT Time Calculation (min) 41 min    Activity Tolerance Patient tolerated treatment well;Patient limited by pain    Behavior During Therapy [facility] for tasks assessed/performed             Past Medical History:  Diagnosis Date   Anterolisthesis    Cervical spine   Asthma    Connective tissue disorder ([place])    recurrent carotid arteritis, temporal arteritis, vasculitis mandible, general hepatitis, avascular necrosis bilat   DDD (degenerative disc disease), lumbar    Diabetes mellitus without complication ([place])    Diverticulosis    Duodenitis    Ganglion cyst of right foot    Gouty arthritis    Hiatal hernia with GERD    History of cardiovascular stress test    a. 07/2018 [facility]): Fixed inferoapical defect w/ nl contraction-->attenuation artifact. No ischemia. EF 63%.   Hyperlipidemia    Hypertension    a. 02/2019 Renal artery duplex: no evidence of prox RAS.   Hyperuricemia    Keratitis sicca, bilateral (HCC)    Lymphedema of both lower extremities    Osteoarthritis    Pericarditis    Recurrent: Aug 97, July 05, Sept 08, July 16, July 18   PUD (peptic ulcer disease)    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[place])    Sjogren's syndrome ([place])    Syncope    Tendonitis, Achilles, right    Tortuous colon    Trigger finger     Past Surgical History:  Procedure Laterality Date   ABDOMINAL HYSTERECTOMY      BREAST BIOPSY     BREAST EXCISIONAL BIOPSY Left 1986   neg   CHOLECYSTECTOMY     COLONOSCOPY WITH PROPOFOL N/A 08/26/2018   Procedure: COLONOSCOPY WITH PROPOFOL;  Surgeon: Lucilla Lame, MD;  Location: [facility] ENDOSCOPY;  Service: Endoscopy;  Laterality: N/A;   CYSTOSCOPY  02/26/2018   [name], MD    distal arthrectomy     ESOPHAGOGASTRODUODENOSCOPY (EGD) WITH PROPOFOL N/A 08/26/2018   Procedure: ESOPHAGOGASTRODUODENOSCOPY (EGD) WITH PROPOFOL;  Surgeon: Lucilla Lame, MD;  Location: [facility] ENDOSCOPY;  Service: Endoscopy;  Laterality: N/A;   EXCISION NEUROMA     KNEE SURGERY Bilateral    1985, 2001, 11/2002, 12/2006   panhysterectomy  10/1983   SHOULDER SURGERY Right    reverse total arthroplasty w/ biceps tenodesis   TONSILLECTOMY AND ADENOIDECTOMY     TOTAL HIP ARTHROPLASTY Right 04/2007   WISDOM TOOTH EXTRACTION      There were no vitals filed for this visit.   Subjective Assessment - 03/06/21 1552     Subjective Pt reports that she has worsening deep posterior hip pain. She feels her specfiic posterolateral gluteal symptoms have improved, but this deep pain is worsening. Patient reports pain along ischial tuberosity region. She is unsure of specific aggravating activity.  She denies paresthesia/numbness. She is unsure of any specific exercises are irritating this deep posterior pain.    Pertinent History Patient is a 75 year old female with previous episode of PT in this clinic related to low back pain. Patient has current primary complaint of L hip pain. Patient reports she was going ot sit onto toilet and was going to sit and fell into sitting in uncontrolled manner with weight shifted onto her L hip (DOI: 12/08/2020). She states she had pain at the time that worsened more the following day. Patient reports history of bursitis in L hip with multiple episode since the mid 1980s. Patient reports pain affecting posteiror gluteal and L SI region. Pt has history of L AVN. Patient had radiographs  this past Thursday - evidence of degenerative changes and known lumbar DDD. Patient reports 4/10 presently. Patient reports 10/10 pain at the worst. Patient reports no pain lower than 4/10. Patient denies numbness and tingling. Patient reports lower pain if she doesn't "use it." Aggravating factors: walking, more weightbearing, stair ascent>descent.. Easing factors: resting it, "not doing anything." Hx of R rTSA, R THA, L partial knee replacement. Goal: get rid of pain. She reports pain with every step of walking.    Limitations Walking;Standing;House hold activities    Patient Stated Goals Decrease back pain/ improve strength/ mobility.    Pain Onset --   30 years            OBJECTIVE FINDINGS  AROM Lumbar flexion 100% Lumbar extension 50% Lateral flexion: R 75%, L 75% Thoracolumbar rotation: R 25%, L 25% (No reproduction of symptoms with AROM)  PROM PROM grossly within normal limits without symptoms reproduction  Special Tests SLR: R Negative, L Negative   SIJ:  Primary anterior stress Negative Primary posterior stress Negative SIJ compression Negative Sacral thrust Negative   Palpation Tenderness to palpation along greater trochanter, ischial tuberosity, gluteus medius, gluteus maximus (middle and lower fibers), L longissimus lumborum L3-S1  Accessory Motion/PAIVM Pain prior to restriction with L1-S1 CPA, no reproduction of concordant sign.          TREATMENT PERFORMED     NuStep x 5 minutes for improved soft tissue extensibility/mobility prior to manual therapy and exercise; Level 3, seat 11 - unbilled time     Manual Therapy - for symptom modulation, soft tissue sensitivity and mobility, joint mobility, ROM   In prone today, IASTM using Hypervolt to L gluteus medius and minimus  (In supine) Long-axis distraction LLE, gr II for pain relief  (In supine) L Hip PROM within pt tolerance      *not today* DTM/Tpr at gluteus medius; poor pt tolerance due to  sensitivity with palpation  Atttempted use of Mulligan distraction, poor tolerance of belt on adductors with folded towel to cushion belt     Therapeutic Activities -Brief assessment to investigate provocative factors for worsening deep gluteal/ischial pain (see above) -Patient education on findings, recommendation for icing at home and continued work on gentle stretching, prognosis    Cold pack (unbilled) - for anti-inflammatory and analgesic effect, along L lateral hip in R sidelying, x 5 minutes      Therapeutic Exercise - for improved soft tissue mobility, lumbopelvic/hip mobility, isometrics for analgesic effect   *next visit* Sidelying Hip Abduction; 2x8 Supine Bridge with Blue Tband Abduction; 2x10     *not today* Supine piriformis stretch; 2x30sec Sit to stand, raised table; 2x8 Lower trunk rotations; x10 each direction Sidelying clamshell, sidelying R; x10 with light manual resistance and  emphasis on eccentrics Hip ADD isometrics, with belt and ball; 2x10, 5 sec Hip ABD and ADD isometrics, with belt and ball; x10, 5 sec 3-way hip, standing adjacent to table; x8 ea dir, bilat         ASSESSMENT Patient feels that gluteal pain has improved,but she has recently experienced pain of different quality affecting deep gluteal/ischial region. Negative screen today for lumbar spine referral and pt does not have notable paresthesias. Negative SIJ provocative testing. Pt does have tenderness to palpation along ischial tuberosity with no pain with gluteal mm or hamstrings mm contraction. Discussed with patient possible inflammation/irritation of ischial bursa and use of ice pack frequently at home for anti-inflammatory effect and offloading this area during sitting/lying. Cannot rule out pain stemming from long-standing L hip AVN. Pt does have sensitivity diffusely along gluteal musculature, but she has been able to tolerate gluteal isotonics well over the last 2 weeks. Patient has  remaining deficits in hip strength, functional mobility, difficulty with stair negotiation, and L gluteal/ischial pain. Patient will benefit from continued skilled therapeutic intervention to address the above deficits as needed for improved function and QoL.                PT Short Term Goals - 02/07/21 1542       PT SHORT TERM GOAL #1   Title Pt will be independent and 100% compliant with established HEP and activity modification as needed to augment PT intervention and prevent flare-up of hip pain as needed for best return to prior level of function.    Baseline 01/04/21: discussed palliative care and activity modification; initiating HEP next visit. 02/06/21: fair compliance with HEP    Time 3    Period Weeks    Status Partially Met    Target Date 02/14/21               PT Long Term Goals - 02/06/21 1735       PT LONG TERM GOAL #1   Title Patient will demonstrate improved function as evidenced by a score of 47 on FOTO measure for full participation in activities at home and in the community.    Baseline 01/04/21: FOTO 27.   02/06/21: FOTO 57    Time 8    Period Weeks    Status Achieved    Target Date 03/01/21      PT LONG TERM GOAL #2   Title Patient will have hip strength to 4+/5 or greater for all motions tested without reproduction of pain indicative of improved capacity for loading paraspinal/pelvic and gluteal mm as needed for performance of transferring, stair negotiation    Baseline 01/04/21: 4/5 hip flexors and abduction(seated), 4/5 quad and HS, weak hip ER.  02/06/21: Remaining weakness in quads and Hip ER    Time 8    Period Weeks    Status Partially Met    Target Date 03/01/21      PT LONG TERM GOAL #3   Title Patient will perform stair negotiaiton in clinic, ascending and descending 4 steps with unilateral handrail support and no loss of balance or buckling independently without net increase in hip/back pain as needed for accessing home and community     Baseline 01/04/21: Significant pain with stair negotiation. 02/07/21: Deferred    Time 8    Period Weeks    Status Deferred    Target Date 03/01/21      PT LONG TERM GOAL #4   Title Patient will have no  increase in pain with functional hip ROM in all planes as needed for lower limb management during self-care tasks, car transfers, and bed mobility tasks    Baseline 01/04/21: Pain with end-range hip ER and IR.   02/07/21: No pain wth hip PROM/AROM at this time, will assess next visit for delayed-onset pain/soreness    Time 6    Period Weeks    Status Achieved    Target Date 02/15/21      PT LONG TERM GOAL #5   Title Patient will perform independent sit to stand from 19-20 inch chair with no UE support and no reproduction of hip pain indicative of improved ability to perform transferring    Baseline 01/04/21: difficulty and pain with sit to stand.   02/07/21: Performed IND without increase in pain with closed-chain upper limb extension to facilitate lifting of pelvis    Time 6    Period Weeks    Status Partially Met    Target Date 02/15/21                   Plan - 03/07/21 1537     Clinical Impression Statement Patient feels that gluteal pain has improved,but she has recently experienced pain of different quality affecting deep gluteal/ischial region. Negative screen today for lumbar spine referral and pt does not have notable paresthesias. Negative SIJ provocative testing. Pt does have tenderness to palpation along ischial tuberosity with no pain with gluteal mm or hamstrings mm contraction. Discussed with patient possible inflammation/irritation of ischial bursa and use of ice pack frequently at home for anti-inflammatory effect and offloading this area during sitting/lying. Cannot rule out pain stemming from long-standing L hip AVN. Pt does have sensitivity diffusely along gluteal musculature, but she has been able to tolerate gluteal isotonics well over the last 2 weeks. Patient has  remaining deficits in hip strength, functional mobility, difficulty with stair negotiation, and L gluteal/ischial pain. Patient will benefit from continued skilled therapeutic intervention to address the above deficits as needed for improved function and QoL.    Personal Factors and Comorbidities Education;Comorbidity 3+;Age;Time since onset of injury/illness/exacerbation    Comorbidities AAA, AVN, HTN, Hx of bilateral LE edema, connective tissue disorder, Type 2 DM    Examination-Activity Limitations Locomotion Level;Squat;Stairs;Stand;Transfers;Bed Mobility    Examination-Participation Restrictions Driving;Community Activity;Cleaning    Stability/Clinical Decision Making Evolving/Moderate complexity    Rehab Potential Fair    PT Frequency 2x / week    PT Duration 8 weeks    PT Treatment/Interventions ADLs/Self Care Home Management;Electrical Stimulation;Moist Heat;Gait training;Stair training;Functional mobility training;Neuromuscular re-education;Balance training;Therapeutic exercise;Therapeutic activities;Patient/family education;Manual techniques;Passive range of motion    PT Next Visit Plan Manual therapy for desensitization and soft tissue mobility of affected gluteal musculature, gentle stretching.. Progress with gluteal activation, progressive loading and gradually increasing emphasis on standing and functional activities/CKC drills as tolerated.    PT Home Exercise Plan Access Code [email or web address], aggressive icing for inflamed ischial bursa    Consulted and Agree with Plan of Care Patient             Patient will benefit from skilled therapeutic intervention in order to improve the following deficits and impairments:  Abnormal gait, Pain, Postural dysfunction, Decreased mobility, Decreased activity tolerance, Decreased endurance, Decreased range of motion, Decreased strength, Impaired flexibility, Difficulty walking  Visit Diagnosis: Pain in left hip  Difficulty in walking, not elsewhere  classified  Muscle weakness (generalized)     Problem List Patient Active Problem List   Diagnosis Date  Noted   Nausea 03/31/2020   Multiple thyroid nodules 12/28/2019   Thyroid nodule 09/29/2019   Lymphedema 11/10/2018   PAD (peripheral artery disease) ([place]) 10/12/2018   Aortic atherosclerosis ([place]) 10/12/2018   Carotid stenosis, asymptomatic, bilateral 10/12/2018   Positive colorectal cancer screening using Cologuard test 10/09/2018   Gastroesophageal reflux disease    Acute gastritis without hemorrhage    Osteoarthritis 04/29/2018   Hiatal hernia with GERD 04/29/2018   Hyperlipidemia 04/29/2018   Lymphedema of both lower extremities 04/29/2018   Left knee pain 10/23/2016   Cataracts, bilateral 07/31/2016   Glaucoma 07/31/2016   Lateral epicondylitis of left elbow 10/20/2015   Vitamin D deficiency 10/20/2015   Type 2 diabetes, controlled, with neuropathy ([place]) 10/16/2015   Gouty arthritis 06/03/2015   H/O syncope 06/03/2015   Mild intermittent asthma 06/03/2015   Multiple gastric ulcers 06/03/2015   Schatzki's ring 06/03/2015   Undifferentiated connective tissue disease ([place]) 06/03/2015   Bone disorder 04/05/2015   Dental injury 04/05/2015   Pericarditis 04/05/2015   Sjogren's syndrome ([place]) 09/07/2014   Elevated alkaline phosphatase level 08/24/2014   Lactose intolerance 08/24/2014   Obesity 08/24/2014   Peripheral edema 08/24/2014   Benign hypertension with CKD (chronic kidney disease) stage III 08/03/2014   Abdominal adhesions 08/02/2014   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 [place], [place], [address] Phone: [phone]   Fax:  [phone]  Name: [name] MRN: [id number] Date of Birth: [birth year]

## 2021-03-08 ENCOUNTER — Encounter: Payer: Self-pay | Admitting: Physical Therapy

## 2021-03-08 ENCOUNTER — Ambulatory Visit: Payer: Medicare Other | Admitting: Physical Therapy

## 2021-03-08 ENCOUNTER — Other Ambulatory Visit: Payer: Self-pay

## 2021-03-08 DIAGNOSIS — M25552 Pain in left hip: Secondary | ICD-10-CM

## 2021-03-08 DIAGNOSIS — M6281 Muscle weakness (generalized): Secondary | ICD-10-CM

## 2021-03-08 DIAGNOSIS — R262 Difficulty in walking, not elsewhere classified: Secondary | ICD-10-CM

## 2021-03-08 NOTE — Therapy (Signed)
[facility] [address]. [place], [place], [address] Phone: [phone]   Fax:  [phone]  Physical Therapy Treatment Physical Therapy Progress Note/Recertification   Dates of reporting period  02/06/21   to   03/08/21   Patient Details  Name: [name] MRN: [id number] Date of Birth: 01/18/1946 Referring Provider (PT): Rolanda Jay [place], [place]   Encounter Date: 03/08/2021   PT End of Session - 03/08/21 1642     Visit Number 14    Number of Visits 17    Date for PT Re-Evaluation 03/08/21    Authorization Time Period last prog note 9/28    Progress Note Due on Visit 17    PT Start Time 1636    PT Stop Time 1720    PT Time Calculation (min) 44 min    Activity Tolerance Patient tolerated treatment well;Patient limited by pain    Behavior During Therapy [facility] for tasks assessed/performed             Past Medical History:  Diagnosis Date   Anterolisthesis    Cervical spine   Asthma    Connective tissue disorder ([place])    recurrent carotid arteritis, temporal arteritis, vasculitis mandible, general hepatitis, avascular necrosis bilat   DDD (degenerative disc disease), lumbar    Diabetes mellitus without complication ([place])    Diverticulosis    Duodenitis    Ganglion cyst of right foot    Gouty arthritis    Hiatal hernia with GERD    History of cardiovascular stress test    a. 07/2018 [facility]): Fixed inferoapical defect w/ nl contraction-->attenuation artifact. No ischemia. EF 63%.   Hyperlipidemia    Hypertension    a. 02/2019 Renal artery duplex: no evidence of prox RAS.   Hyperuricemia    Keratitis sicca, bilateral (HCC)    Lymphedema of both lower extremities    Osteoarthritis    Pericarditis    Recurrent: Aug 97, July 05, Sept 08, July 16, July 18   PUD (peptic ulcer disease)    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[place])    Sjogren's syndrome ([place])    Syncope    Tendonitis, Achilles, right    Tortuous colon     Trigger finger     Past Surgical History:  Procedure Laterality Date   ABDOMINAL HYSTERECTOMY     BREAST BIOPSY     BREAST EXCISIONAL BIOPSY Left 1986   neg   CHOLECYSTECTOMY     COLONOSCOPY WITH PROPOFOL N/A 08/26/2018   Procedure: COLONOSCOPY WITH PROPOFOL;  Surgeon: Lucilla Lame, MD;  Location: [facility] ENDOSCOPY;  Service: Endoscopy;  Laterality: N/A;   CYSTOSCOPY  02/26/2018   [name], MD    distal arthrectomy     ESOPHAGOGASTRODUODENOSCOPY (EGD) WITH PROPOFOL N/A 08/26/2018   Procedure: ESOPHAGOGASTRODUODENOSCOPY (EGD) WITH PROPOFOL;  Surgeon: Lucilla Lame, MD;  Location: [facility] ENDOSCOPY;  Service: Endoscopy;  Laterality: N/A;   EXCISION NEUROMA     KNEE SURGERY Bilateral    1985, 2001, 11/2002, 12/2006   panhysterectomy  10/1983   SHOULDER SURGERY Right    reverse total arthroplasty w/ biceps tenodesis   TONSILLECTOMY AND ADENOIDECTOMY     TOTAL HIP ARTHROPLASTY Right 04/2007   WISDOM TOOTH EXTRACTION      There were no vitals filed for this visit.   Subjective Assessment - 03/08/21 1640     Subjective Patient reports that gluteal pain is notably better this evening versus her previous visit. Patient reports doing better with stair  negotiation at this time. She has been icing her hip regularly since her last visit. Patient reports 90% SANE score for her primary complaint coming into PT. Patient reports mild pain remaining with standing/walking activity. Pt reports that frequently moving and and changing positions for her hips/shoulders during the night has been the norm for her - she returns right back to sleep with change in position.    Pertinent History Patient is a 75 year old female with previous episode of PT in this clinic related to low back pain. Patient has current primary complaint of L hip pain. Patient reports she was going ot sit onto toilet and was going to sit and fell into sitting in uncontrolled manner with weight shifted onto her L hip (DOI: 12/08/2020). She  states she had pain at the time that worsened more the following day. Patient reports history of bursitis in L hip with multiple episode since the mid 1980s. Patient reports pain affecting posteiror gluteal and L SI region. Pt has history of L AVN. Patient had radiographs this past Thursday - evidence of degenerative changes and known lumbar DDD. Patient reports 4/10 presently. Patient reports 10/10 pain at the worst. Patient reports no pain lower than 4/10. Patient denies numbness and tingling. Patient reports lower pain if she doesn't "use it." Aggravating factors: walking, more weightbearing, stair ascent>descent.. Easing factors: resting it, "not doing anything." Hx of R rTSA, R THA, L partial knee replacement. Goal: get rid of pain. She reports pain with every step of walking.    Limitations Walking;Standing;House hold activities    Patient Stated Goals Decrease back pain/ improve strength/ mobility.    Pain Onset --   30 years               OBJECTIVE FINDINGS   Gait Mild forward flexed posture throughout gait cycle, mild R pelvic drop with L stance phase   Palpation Tenderness to palpation along L greater trochanter 2+, L gluteus medius and minimus 2+, ischial tuberosity 2+   Strength (out of 5) R/L 4+/4+ Hip flexion 4/4* Hip ER 5/4+ Hip IR 4+/4+ Hip abduction (seated) 5/5 Hip adduction 4+/4+ Knee extension 5/5 Knee flexion *Indicates pain   AROM Lumbar forward flexion (65): 100% Lumbar extension (30): 50% Lumbar lateral flexion (25): R: 100% L: 75%* Thoracic and Lumbar rotation (30 degrees):  R: 75% L: 75% *Indicates pain     PROM (degrees) R/L Hip flexion: Not tested/120 Hip external rotation: Not tested/45* (mild pain posterolateral hip at end-range) Hip internal rotation: Not tested/WFL Hip abduction: Not tested/WNL     Functional Tasks Sit to stand: Significant UE support and weight shifted to LLE with no pain behaviors   Stair negotiation: IND stair  ascent with unilateral L handrail support; reciprocal pattern for ascent, descending with LLE leading         TREATMENT PERFORMED     NuStep x 5 minutes for improved soft tissue extensibility/mobility prior to manual therapy and exercise; Level 3, seat 11 - unbilled time     Therapeutic Activities  Re-assessment performed (see above)      Manual Therapy - for symptom modulation, soft tissue sensitivity and mobility, joint mobility, ROM   In R sidelying, IASTM using Hypervolt to L gluteus medius and minimus   (In supine) L Hip PROM within pt tolerance     *not today* (In supine) Long-axis distraction LLE, gr II for pain relief   DTM/Tpr at gluteus medius; poor pt tolerance due to sensitivity with palpation  Atttempted use of Mulligan distraction, poor tolerance of belt on adductors with folded towel to cushion belt           Cold pack (unbilled) - for anti-inflammatory and analgesic effect, along L lateral hip in R sidelying, x 5 minutes        Therapeutic Exercise - for improved soft tissue mobility, lumbopelvic/hip mobility, isometrics for analgesic effect   *next visit* Sidelying Hip Abduction; 2x8 Supine Bridge with Blue Tband Abduction; 2x10     *not today* Supine piriformis stretch; 2x30sec Sit to stand, raised table; 2x8 Lower trunk rotations; x10 each direction Sidelying clamshell, sidelying R; x10 with light manual resistance and emphasis on eccentrics Hip ADD isometrics, with belt and ball; 2x10, 5 sec Hip ABD and ADD isometrics, with belt and ball; x10, 5 sec 3-way hip, standing adjacent to table; x8 ea dir, bilat         ASSESSMENT Patient has made good progress to date with FOTO score met over the last 2 re-assessments. She has normal hip ROM, but she does experience fleeting posterolateral hip pain with end-range ER. She has significantly improved tolerance of walking and stair negotiation compared to IE. She has improved hip strength. She  exhibits safe negotiation of 4 stairs in clinic simulating entering/exiting her home with no major movement deviations, LOB, or pain reproduction. She has not made further progress with performance of sit to stand. Pt may be limited in functional gains by comorbid chronic R knee pain. Her primary complaint has notably improved to date. Patient has remaining deficits in hip strength, functional mobility, difficulty with sit to stand, and L gluteal/ischial pain. Patient will benefit from continued skilled therapeutic intervention to address the above deficits as needed for improved function and QoL.      PT Short Term Goals - 03/08/21 1645       PT SHORT TERM GOAL #1   Title Pt will be independent and 100% compliant with established HEP and activity modification as needed to augment PT intervention and prevent flare-up of hip pain as needed for best return to prior level of function.    Baseline 01/04/21: discussed palliative care and activity modification; initiating HEP next visit. 02/06/21: fair compliance with HEP.  03/08/21: mostly compliant with HEP with intermittent non-compliance    Time 3    Period Weeks    Status Partially Met    Target Date 02/14/21               PT Long Term Goals - 03/09/21 1614       PT LONG TERM GOAL #1   Title Patient will demonstrate improved function as evidenced by a score of 47 on FOTO measure for full participation in activities at home and in the community.    Baseline 01/04/21: FOTO 27.   02/06/21: FOTO 57.   03/08/21: 55    Time 8    Period Weeks    Status Achieved    Target Date 03/01/21      PT LONG TERM GOAL #2   Title Patient will have hip strength to 4+/5 or greater for all motions tested without reproduction of pain indicative of improved capacity for loading paraspinal/pelvic and gluteal mm as needed for performance of transferring, stair negotiation    Baseline 01/04/21: 4/5 hip flexors and abduction(seated), 4/5 quad and HS, weak hip ER.   02/06/21: Remaining weakness in quads and Hip ER.  03/08/21: Remaining weakness in hip ER only    Time 8  Period Weeks    Status Partially Met    Target Date 03/29/21      PT LONG TERM GOAL #3   Title Patient will perform stair negotiation in clinic, ascending and descending 4 steps with unilateral handrail support and no loss of balance or buckling independently without net increase in hip/back pain as needed for accessing home and community    Baseline 01/04/21: Significant pain with stair negotiation. 02/07/21: Deferred.  03/08/21: Performed safely with no LOB or buckling without increase in pain    Time 8    Period Weeks    Status Achieved    Target Date 03/08/21      PT LONG TERM GOAL #4   Title Patient will have no increase in pain with functional hip ROM in all planes as needed for lower limb management during self-care tasks, car transfers, and bed mobility tasks    Baseline 01/04/21: Pain with end-range hip ER and IR.   02/07/21: No pain wth hip PROM/AROM at this time, will assess next visit for delayed-onset pain/soreness.   03/08/21: Fleeting pain with end-range ER only    Time 6    Period Weeks    Status Partially Met    Target Date 03/29/21      PT LONG TERM GOAL #5   Title Patient will perform independent sit to stand from 19-20 inch chair with no UE support and no reproduction of hip pain indicative of improved ability to perform transferring    Baseline 01/04/21: difficulty and pain with sit to stand.   02/07/21: Performed IND without increase in pain with closed-chain upper limb extension to facilitate lifting of pelvis.  03/08/21: No change from previous progress note    Time 6    Period Weeks    Status Partially Met    Target Date 03/29/21                   Plan - 03/09/21 1622     Clinical Impression Statement Patient has made good progress to date with FOTO score met over the last 2 re-assessments. She has normal hip ROM, but she does experience fleeting  posterolateral hip pain with end-range ER. She has significantly improved tolerance of walking and stair negotiation compared to IE. She has improved hip strength. She exhibits safe negotiation of 4 stairs in clinic simulating entering/exiting her home with no major movement deviations, LOB, or pain reproduction. She has not made further progress with performance of sit to stand. Pt may be limited in functional gains by comorbid chronic R knee pain. Her primary complaint has notably improved to date. Patient has remaining deficits in hip strength, functional mobility, difficulty with sit to stand, and L gluteal/ischial pain. Patient will benefit from continued skilled therapeutic intervention to address the above deficits as needed for improved function and QoL.    Personal Factors and Comorbidities Education;Comorbidity 3+;Age;Time since onset of injury/illness/exacerbation    Comorbidities AAA, AVN, HTN, Hx of bilateral LE edema, connective tissue disorder, Type 2 DM    Examination-Activity Limitations Locomotion Level;Squat;Stairs;Stand;Transfers;Bed Mobility    Examination-Participation Restrictions Driving;Community Activity;Cleaning    Stability/Clinical Decision Making Evolving/Moderate complexity    Rehab Potential Fair    PT Frequency 2x / week    PT Duration 8 weeks    PT Treatment/Interventions ADLs/Self Care Home Management;Electrical Stimulation;Moist Heat;Gait training;Stair training;Functional mobility training;Neuromuscular re-education;Balance training;Therapeutic exercise;Therapeutic activities;Patient/family education;Manual techniques;Passive range of motion    PT Next Visit Plan Manual therapy for desensitization and soft  tissue mobility of affected gluteal musculature, gentle stretching.. Progress with gluteal activation, progressive loading and gradually increasing emphasis on standing and functional activities/CKC drills as tolerated.    PT Home Exercise Plan Access Code [email or web address],  aggressive icing for inflamed ischial bursa    Consulted and Agree with Plan of Care Patient             Patient will benefit from skilled therapeutic intervention in order to improve the following deficits and impairments:  Abnormal gait, Pain, Postural dysfunction, Decreased mobility, Decreased activity tolerance, Decreased endurance, Decreased range of motion, Decreased strength, Impaired flexibility, Difficulty walking  Visit Diagnosis: Pain in left hip - Plan: PT plan of care cert/re-cert  Difficulty in walking, not elsewhere classified - Plan: PT plan of care cert/re-cert  Muscle weakness (generalized) - Plan: PT plan of care cert/re-cert     Problem List Patient Active Problem List   Diagnosis Date Noted   Nausea 03/31/2020   Multiple thyroid nodules 12/28/2019   Thyroid nodule 09/29/2019   Lymphedema 11/10/2018   PAD (peripheral artery disease) ([place]) 10/12/2018   Aortic atherosclerosis ([place]) 10/12/2018   Carotid stenosis, asymptomatic, bilateral 10/12/2018   Positive colorectal cancer screening using Cologuard test 10/09/2018   Gastroesophageal reflux disease    Acute gastritis without hemorrhage    Osteoarthritis 04/29/2018   Hiatal hernia with GERD 04/29/2018   Hyperlipidemia 04/29/2018   Lymphedema of both lower extremities 04/29/2018   Left knee pain 10/23/2016   Cataracts, bilateral 07/31/2016   Glaucoma 07/31/2016   Lateral epicondylitis of left elbow 10/20/2015   Vitamin D deficiency 10/20/2015   Type 2 diabetes, controlled, with neuropathy ([place]) 10/16/2015   Gouty arthritis 06/03/2015   H/O syncope 06/03/2015   Mild intermittent asthma 06/03/2015   Multiple gastric ulcers 06/03/2015   Schatzki's ring 06/03/2015   Undifferentiated connective tissue disease ([place]) 06/03/2015   Bone disorder 04/05/2015   Dental injury 04/05/2015   Pericarditis 04/05/2015   Sjogren's syndrome ([place]) 09/07/2014   Elevated alkaline phosphatase level 08/24/2014   Lactose  intolerance 08/24/2014   Obesity 08/24/2014   Peripheral edema 08/24/2014   Benign hypertension with CKD (chronic kidney disease) stage III 08/03/2014   Abdominal adhesions 08/02/2014   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 [place], [place], [address] Phone: [phone]   Fax:  [phone]  Name: [name] MRN: [id number] Date of Birth: 10-21-1945

## 2021-03-13 ENCOUNTER — Encounter: Payer: Self-pay | Admitting: Physical Therapy

## 2021-03-13 ENCOUNTER — Ambulatory Visit: Payer: Medicare Other | Attending: Family Medicine | Admitting: Physical Therapy

## 2021-03-13 ENCOUNTER — Other Ambulatory Visit: Payer: Self-pay

## 2021-03-13 DIAGNOSIS — R262 Difficulty in walking, not elsewhere classified: Secondary | ICD-10-CM | POA: Diagnosis present

## 2021-03-13 DIAGNOSIS — M6281 Muscle weakness (generalized): Secondary | ICD-10-CM | POA: Diagnosis present

## 2021-03-13 DIAGNOSIS — M25552 Pain in left hip: Secondary | ICD-10-CM | POA: Insufficient documentation

## 2021-03-13 NOTE — Therapy (Signed)
[facility] [address] [place], [place], [address] Phone: [phone]   Fax:  [phone]  Physical Therapy Treatment  Patient Details  Name: Clydia Nieves MRN: [id number] Date of Birth: Mar 08, 1946 Referring Provider (PT): Rolanda Jay [place], [place]   Encounter Date: 03/13/2021   PT End of Session - 03/13/21 [place]     Visit Number 15    Number of Visits 17    Date for PT Re-Evaluation 03/08/21    Authorization Time Period last prog note 9/28    Progress Note Due on Visit 17    PT Start Time 1757    PT Stop Time 1843    PT Time Calculation (min) 46 min    Activity Tolerance Patient tolerated treatment well;Patient limited by pain    Behavior During Therapy [facility] for tasks assessed/performed             Past Medical History:  Diagnosis Date   Anterolisthesis    Cervical spine   Asthma    Connective tissue disorder ([place])    recurrent carotid arteritis, temporal arteritis, vasculitis mandible, general hepatitis, avascular necrosis bilat   DDD (degenerative disc disease), lumbar    Diabetes mellitus without complication ([place])    Diverticulosis    Duodenitis    Ganglion cyst of right foot    Gouty arthritis    Hiatal hernia with GERD    History of cardiovascular stress test    a. 07/2018 [facility]): Fixed inferoapical defect w/ nl contraction-->attenuation artifact. No ischemia. EF 63%.   Hyperlipidemia    Hypertension    a. 02/2019 Renal artery duplex: no evidence of prox RAS.   Hyperuricemia    Keratitis sicca, bilateral (HCC)    Lymphedema of both lower extremities    Osteoarthritis    Pericarditis    Recurrent: Aug 97, July 05, Sept 08, July 16, July 18   PUD (peptic ulcer disease)    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[place])    Sjogren's syndrome ([place])    Syncope    Tendonitis, Achilles, right    Tortuous colon    Trigger finger     Past Surgical History:  Procedure Laterality Date   ABDOMINAL HYSTERECTOMY      BREAST BIOPSY     BREAST EXCISIONAL BIOPSY Left 1986   neg   CHOLECYSTECTOMY     COLONOSCOPY WITH PROPOFOL N/A 08/26/2018   Procedure: COLONOSCOPY WITH PROPOFOL;  Surgeon: Lucilla Lame, MD;  Location: [facility] ENDOSCOPY;  Service: Endoscopy;  Laterality: N/A;   CYSTOSCOPY  02/26/2018   [name], MD    distal arthrectomy     ESOPHAGOGASTRODUODENOSCOPY (EGD) WITH PROPOFOL N/A 08/26/2018   Procedure: ESOPHAGOGASTRODUODENOSCOPY (EGD) WITH PROPOFOL;  Surgeon: Lucilla Lame, MD;  Location: [facility] ENDOSCOPY;  Service: Endoscopy;  Laterality: N/A;   EXCISION NEUROMA     KNEE SURGERY Bilateral    1985, 2001, 11/2002, 12/2006   panhysterectomy  10/1983   SHOULDER SURGERY Right    reverse total arthroplasty w/ biceps tenodesis   TONSILLECTOMY AND ADENOIDECTOMY     TOTAL HIP ARTHROPLASTY Right 04/2007   WISDOM TOOTH EXTRACTION      There were no vitals filed for this visit.   Subjective Assessment - 03/13/21 1757     Subjective Patient reports notable pain this past Saturday night along L ischial region and along L lateral hip with lying. She reports her BP is still running somewhat high with her current medical management - no BP > 200/100. Pt  reports gluteal pain transiently with initiation of warm-up that improves following continued warm-up. She reports feeling "okay" today compared to pain experienced over the weekend.    Pertinent History Patient is a 75 year old female with previous episode of PT in this clinic related to low back pain. Patient has current primary complaint of L hip pain. Patient reports she was going ot sit onto toilet and was going to sit and fell into sitting in uncontrolled manner with weight shifted onto her L hip (DOI: 12/08/2020). She states she had pain at the time that worsened more the following day. Patient reports history of bursitis in L hip with multiple episode since the mid 1980s. Patient reports pain affecting posteiror gluteal and L SI region. Pt has history of L  AVN. Patient had radiographs this past Thursday - evidence of degenerative changes and known lumbar DDD. Patient reports 4/10 presently. Patient reports 10/10 pain at the worst. Patient reports no pain lower than 4/10. Patient denies numbness and tingling. Patient reports lower pain if she doesn't "use it." Aggravating factors: walking, more weightbearing, stair ascent>descent.. Easing factors: resting it, "not doing anything." Hx of R rTSA, R THA, L partial knee replacement. Goal: get rid of pain. She reports pain with every step of walking.    Limitations Walking;Standing;House hold activities    Patient Stated Goals Decrease back pain/ improve strength/ mobility.    Pain Onset --   30 years                 TREATMENT PERFORMED     NuStep x 5 minutes for improved soft tissue extensibility/mobility prior to manual therapy and exercise; Level 3, seat 11 - unbilled time        Manual Therapy - for symptom modulation, soft tissue sensitivity and mobility, joint mobility, ROM   In R sidelying, IASTM using Hypervolt to L gluteus medius and minimus   (In supine) L Hip PROM within pt tolerance, passive piriformis stretch 3x30sec     *not today* (In supine) Long-axis distraction LLE, gr II for pain relief DTM/Tpr at gluteus medius; poor pt tolerance due to sensitivity with palpation  Atttempted use of Mulligan distraction, poor tolerance of belt on adductors with folded towel to cushion belt         Therapeutic Exercise - for improved soft tissue mobility, lumbopelvic/hip mobility, isometrics for analgesic effect    Sidelying Hip Abduction; 2x10 Supine Bridge with Blue Tband Abduction; 2x8 Sidestep with Theraband, Red Tband at ankles; 4x D/B in // bars     *not today* Supine piriformis stretch; 2x30sec Sit to stand, raised table; 2x8 Lower trunk rotations; x10 each direction Sidelying clamshell, sidelying R; x10 with light manual resistance and emphasis on eccentrics Hip  ADD isometrics, with belt and ball; 2x10, 5 sec Hip ABD and ADD isometrics, with belt and ball; x10, 5 sec 3-way hip, standing adjacent to table; x8 ea dir, bilat         ASSESSMENT Patient has improved complaints of hip pain, though she does have intermittent deep gluteal pain just superior to ischial tuberosity. Patient does exhibit overall normal hip ROM, though she has pain with hip external rotation end-range consistently. Pt has been able to continue with hip strengthening in clinic without exacerbation of gluteal pain. She does report delayed onset of soreness in her low back following posterior chain loading/bridging. Discussed expectations for delayed onset muscle soreness and discussed modification of volume prn if patient is experiencing increase in pain versus DOMS.  Patient has remaining deficits in hip strength, functional mobility, difficulty with sit to stand, and L gluteal/ischial pain. Patient will benefit from continued skilled therapeutic intervention to address the above deficits as needed for improved function and QoL.      PT Short Term Goals - 03/08/21 1645       PT SHORT TERM GOAL #1   Title Pt will be independent and 100% compliant with established HEP and activity modification as needed to augment PT intervention and prevent flare-up of hip pain as needed for best return to prior level of function.    Baseline 01/04/21: discussed palliative care and activity modification; initiating HEP next visit. 02/06/21: fair compliance with HEP.  03/08/21: mostly compliant with HEP with intermittent non-compliance    Time 3    Period Weeks    Status Partially Met    Target Date 02/14/21               PT Long Term Goals - 03/09/21 1614       PT LONG TERM GOAL #1   Title Patient will demonstrate improved function as evidenced by a score of 47 on FOTO measure for full participation in activities at home and in the community.    Baseline 01/04/21: FOTO 27.   02/06/21: FOTO 57.    03/08/21: 55    Time 8    Period Weeks    Status Achieved    Target Date 03/01/21      PT LONG TERM GOAL #2   Title Patient will have hip strength to 4+/5 or greater for all motions tested without reproduction of pain indicative of improved capacity for loading paraspinal/pelvic and gluteal mm as needed for performance of transferring, stair negotiation    Baseline 01/04/21: 4/5 hip flexors and abduction(seated), 4/5 quad and HS, weak hip ER.  02/06/21: Remaining weakness in quads and Hip ER.  03/08/21: Remaining weakness in hip ER only    Time 8    Period Weeks    Status Partially Met    Target Date 03/29/21      PT LONG TERM GOAL #3   Title Patient will perform stair negotiation in clinic, ascending and descending 4 steps with unilateral handrail support and no loss of balance or buckling independently without net increase in hip/back pain as needed for accessing home and community    Baseline 01/04/21: Significant pain with stair negotiation. 02/07/21: Deferred.  03/08/21: Performed safely with no LOB or buckling without increase in pain    Time 8    Period Weeks    Status Achieved    Target Date 03/08/21      PT LONG TERM GOAL #4   Title Patient will have no increase in pain with functional hip ROM in all planes as needed for lower limb management during self-care tasks, car transfers, and bed mobility tasks    Baseline 01/04/21: Pain with end-range hip ER and IR.   02/07/21: No pain wth hip PROM/AROM at this time, will assess next visit for delayed-onset pain/soreness.   03/08/21: Fleeting pain with end-range ER only    Time 6    Period Weeks    Status Partially Met    Target Date 03/29/21      PT LONG TERM GOAL #5   Title Patient will perform independent sit to stand from 19-20 inch chair with no UE support and no reproduction of hip pain indicative of improved ability to perform transferring    Baseline 01/04/21: difficulty and  pain with sit to stand.   02/07/21: Performed IND without  increase in pain with closed-chain upper limb extension to facilitate lifting of pelvis.  03/08/21: No change from previous progress note    Time 6    Period Weeks    Status Partially Met    Target Date 03/29/21                   Plan - 03/14/21 1454     Clinical Impression Statement Patient has improved complaints of hip pain, though she does have intermittent deep gluteal pain just superior to ischial tuberosity. Patient does exhibit overall normal hip ROM, though she has pain with hip external rotation end-range consistently. Pt has been able to continue with hip strengthening in clinic without exacerbation of gluteal pain. She does report delayed onset of soreness in her low back following posterior chain loading/bridging. Discussed expectations for delayed onset muscle soreness and discussed modification of volume prn if patient is experiencing increase in pain versus DOMS. Patient has remaining deficits in hip strength, functional mobility, difficulty with sit to stand, and L gluteal/ischial pain. Patient will benefit from continued skilled therapeutic intervention to address the above deficits as needed for improved function and QoL.    Personal Factors and Comorbidities Education;Comorbidity 3+;Age;Time since onset of injury/illness/exacerbation    Comorbidities AAA, AVN, HTN, Hx of bilateral LE edema, connective tissue disorder, Type 2 DM    Examination-Activity Limitations Locomotion Level;Squat;Stairs;Stand;Transfers;Bed Mobility    Examination-Participation Restrictions Driving;Community Activity;Cleaning    Stability/Clinical Decision Making Evolving/Moderate complexity    Rehab Potential Fair    PT Frequency 2x / week    PT Duration 8 weeks    PT Treatment/Interventions ADLs/Self Care Home Management;Electrical Stimulation;Moist Heat;Gait training;Stair training;Functional mobility training;Neuromuscular re-education;Balance training;Therapeutic exercise;Therapeutic  activities;Patient/family education;Manual techniques;Passive range of motion    PT Next Visit Plan Manual therapy for desensitization and soft tissue mobility of affected gluteal musculature, gentle stretching.. Progress with gluteal activation, progressive loading and gradually increasing emphasis on standing and functional activities/CKC drills as tolerated.    PT Home Exercise Plan Access Code [email or web address], aggressive icing for inflamed ischial bursa    Consulted and Agree with Plan of Care Patient             Patient will benefit from skilled therapeutic intervention in order to improve the following deficits and impairments:  Abnormal gait, Pain, Postural dysfunction, Decreased mobility, Decreased activity tolerance, Decreased endurance, Decreased range of motion, Decreased strength, Impaired flexibility, Difficulty walking  Visit Diagnosis: Pain in left hip  Difficulty in walking, not elsewhere classified  Muscle weakness (generalized)     Problem List Patient Active Problem List   Diagnosis Date Noted   Nausea 03/31/2020   Multiple thyroid nodules 12/28/2019   Thyroid nodule 09/29/2019   Lymphedema 11/10/2018   PAD (peripheral artery disease) ([place]) 10/12/2018   Aortic atherosclerosis ([place]) 10/12/2018   Carotid stenosis, asymptomatic, bilateral 10/12/2018   Positive colorectal cancer screening using Cologuard test 10/09/2018   Gastroesophageal reflux disease    Acute gastritis without hemorrhage    Osteoarthritis 04/29/2018   Hiatal hernia with GERD 04/29/2018   Hyperlipidemia 04/29/2018   Lymphedema of both lower extremities 04/29/2018   Left knee pain 10/23/2016   Cataracts, bilateral 07/31/2016   Glaucoma 07/31/2016   Lateral epicondylitis of left elbow 10/20/2015   Vitamin D deficiency 10/20/2015   Type 2 diabetes, controlled, with neuropathy ([place]) 10/16/2015   Gouty arthritis 06/03/2015   H/O syncope 06/03/2015   Mild  intermittent asthma 06/03/2015   Multiple  gastric ulcers 06/03/2015   Schatzki's ring 06/03/2015   Undifferentiated connective tissue disease ([place]) 06/03/2015   Bone disorder 04/05/2015   Dental injury 04/05/2015   Pericarditis 04/05/2015   Sjogren's syndrome ([place]) 09/07/2014   Elevated alkaline phosphatase level 08/24/2014   Lactose intolerance 08/24/2014   Obesity 08/24/2014   Peripheral edema 08/24/2014   Benign hypertension with CKD (chronic kidney disease) stage III 08/03/2014   Abdominal adhesions 08/02/2014   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 [place], [place], [address] Phone: [phone]   Fax:  [phone]  Name: Skylie Hiott MRN: [id number] Date of Birth: 1946-06-10

## 2021-03-15 ENCOUNTER — Ambulatory Visit: Payer: Medicare Other | Admitting: Physical Therapy

## 2021-03-15 ENCOUNTER — Other Ambulatory Visit: Payer: Self-pay

## 2021-03-15 ENCOUNTER — Encounter: Payer: Self-pay | Admitting: Physical Therapy

## 2021-03-15 DIAGNOSIS — M25552 Pain in left hip: Secondary | ICD-10-CM

## 2021-03-15 DIAGNOSIS — R262 Difficulty in walking, not elsewhere classified: Secondary | ICD-10-CM

## 2021-03-15 DIAGNOSIS — M6281 Muscle weakness (generalized): Secondary | ICD-10-CM

## 2021-03-15 NOTE — Therapy (Signed)
[facility] [address] [place], [place], [address] Phone: [phone]   Fax:  [phone]  Physical Therapy Treatment  Patient Details  Name: [name] MRN: [id number] Date of Birth: 1946/02/06 Referring Provider (PT): Rolanda [place], [place]   Encounter Date: 03/15/2021   PT End of Session - 03/15/21 1749     Visit Number 16    Number of Visits 17    Date for PT Re-Evaluation 03/08/21    Authorization Time Period last prog note 9/28    Progress Note Due on Visit 17    PT Start Time 1717    PT Stop Time 1800    PT Time Calculation (min) 43 min    Activity Tolerance Patient tolerated treatment well;Patient limited by pain    Behavior During Therapy [facility] for tasks assessed/performed             Past Medical History:  Diagnosis Date   Anterolisthesis    Cervical spine   Asthma    Connective tissue disorder ([place])    recurrent carotid arteritis, temporal arteritis, vasculitis mandible, general hepatitis, avascular necrosis bilat   DDD (degenerative disc disease), lumbar    Diabetes mellitus without complication ([place])    Diverticulosis    Duodenitis    Ganglion cyst of right foot    Gouty arthritis    Hiatal hernia with GERD    History of cardiovascular stress test    a. 07/2018 [facility]): Fixed inferoapical defect w/ nl contraction-->attenuation artifact. No ischemia. EF 63%.   Hyperlipidemia    Hypertension    a. 02/2019 Renal artery duplex: no evidence of prox RAS.   Hyperuricemia    Keratitis sicca, bilateral (HCC)    Lymphedema of both lower extremities    Osteoarthritis    Pericarditis    Recurrent: Aug 97, July 05, Sept 08, July 16, July 18   PUD (peptic ulcer disease)    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[place])    Sjogren's syndrome ([place])    Syncope    Tendonitis, Achilles, right    Tortuous colon    Trigger finger     Past Surgical History:  Procedure Laterality Date   ABDOMINAL HYSTERECTOMY      BREAST BIOPSY     BREAST EXCISIONAL BIOPSY Left 1986   neg   CHOLECYSTECTOMY     COLONOSCOPY WITH PROPOFOL N/A 08/26/2018   Procedure: COLONOSCOPY WITH PROPOFOL;  Surgeon: Lucilla Lame, MD;  Location: [facility] ENDOSCOPY;  Service: Endoscopy;  Laterality: N/A;   CYSTOSCOPY  02/26/2018   [name], MD    distal arthrectomy     ESOPHAGOGASTRODUODENOSCOPY (EGD) WITH PROPOFOL N/A 08/26/2018   Procedure: ESOPHAGOGASTRODUODENOSCOPY (EGD) WITH PROPOFOL;  Surgeon: Lucilla Lame, MD;  Location: [facility] ENDOSCOPY;  Service: Endoscopy;  Laterality: N/A;   EXCISION NEUROMA     KNEE SURGERY Bilateral    1985, 2001, 11/2002, 12/2006   panhysterectomy  10/1983   SHOULDER SURGERY Right    reverse total arthroplasty w/ biceps tenodesis   TONSILLECTOMY AND ADENOIDECTOMY     TOTAL HIP ARTHROPLASTY Right 04/2007   WISDOM TOOTH EXTRACTION      There were no vitals filed for this visit.   Subjective Assessment - 03/15/21 1720     Subjective Patient reports feeling relatively well at arrival to PT. Patient reports her BP has been doing well recently, no SBP > 200. Patient reports gluteal pain is mild presently; she reports feeling better on the move versus when she  is sitting still at home.    Pertinent History Patient is a 75 year old female with previous episode of PT in this clinic related to low back pain. Patient has current primary complaint of L hip pain. Patient reports she was going ot sit onto toilet and was going to sit and fell into sitting in uncontrolled manner with weight shifted onto her L hip (DOI: 12/08/2020). She states she had pain at the time that worsened more the following day. Patient reports history of bursitis in L hip with multiple episode since the mid 1980s. Patient reports pain affecting posteiror gluteal and L SI region. Pt has history of L AVN. Patient had radiographs this past Thursday - evidence of degenerative changes and known lumbar DDD. Patient reports 4/10 presently. Patient reports  10/10 pain at the worst. Patient reports no pain lower than 4/10. Patient denies numbness and tingling. Patient reports lower pain if she doesn't "use it." Aggravating factors: walking, more weightbearing, stair ascent>descent.. Easing factors: resting it, "not doing anything." Hx of R rTSA, R THA, L partial knee replacement. Goal: get rid of pain. She reports pain with every step of walking.    Limitations Walking;Standing;House hold activities    Patient Stated Goals Decrease back pain/ improve strength/ mobility.    Pain Onset --   30 years              TREATMENT PERFORMED     NuStep x 5 minutes for improved soft tissue extensibility/mobility prior to manual therapy and exercise; Level 3, seat 11 - unbilled time       Manual Therapy - for symptom modulation, soft tissue sensitivity and mobility, joint mobility, ROM   (In supine) Long-axis distraction LLE, gr II for pain relief  In R sidelying, IASTM using Hypervolt to L gluteus medius and minimus  DTM/Tpr at L piriformis      *not today* (In supine) L Hip PROM within pt tolerance, passive piriformis stretch 3x30sec  Atttempted use of Mulligan distraction, poor tolerance of belt on adductors with folded towel to cushion belt (D/C)          Therapeutic Exercise - for improved soft tissue mobility, lumbopelvic/hip mobility, isometrics for analgesic effect     Sidelying Hip Abduction; 2x10 Sidestep with Theraband, Red Tband at ankles; 4x D/B in // bars Lower trunk rotations; x10 each direction   Pt edu: HEP update and review     *not today* Supine Bridge with Blue Tband Abduction; 2x8 Supine piriformis stretch; 2x30sec Sit to stand, raised table; 2x8 Sidelying clamshell, sidelying R; x10 with light manual resistance and emphasis on eccentrics Hip ADD isometrics, with belt and ball; 2x10, 5 sec Hip ABD and ADD isometrics, with belt and ball; x10, 5 sec 3-way hip, standing adjacent to table; x8 ea dir, bilat          ASSESSMENT Patient is doing well at this time in spite of recent irritation of ischial region. Pt is continuing gentle gluteal isotonics and stretching at home along with icing. She has been able to progress with graded loading in clinic well over the last 2 sessions. Pt is able to perform repeated sidelying hip abduction without exacerbation of gluteal pain acutely. Pt will likely be able to transition to IND home program and self-management following remainder of her visits (given her current progress). Patient has remaining deficits in hip strength, functional mobility, difficulty with sit to stand, and L gluteal/ischial pain. Patient will benefit from continued skilled therapeutic intervention to address  the above deficits as needed for improved function and QoL.      PT Short Term Goals - 03/08/21 1645       PT SHORT TERM GOAL #1   Title Pt will be independent and 100% compliant with established HEP and activity modification as needed to augment PT intervention and prevent flare-up of hip pain as needed for best return to prior level of function.    Baseline 01/04/21: discussed palliative care and activity modification; initiating HEP next visit. 02/06/21: fair compliance with HEP.  03/08/21: mostly compliant with HEP with intermittent non-compliance    Time 3    Period Weeks    Status Partially Met    Target Date 02/14/21               PT Long Term Goals - 03/09/21 1614       PT LONG TERM GOAL #1   Title Patient will demonstrate improved function as evidenced by a score of 47 on FOTO measure for full participation in activities at home and in the community.    Baseline 01/04/21: FOTO 27.   02/06/21: FOTO 57.   03/08/21: 55    Time 8    Period Weeks    Status Achieved    Target Date 03/01/21      PT LONG TERM GOAL #2   Title Patient will have hip strength to 4+/5 or greater for all motions tested without reproduction of pain indicative of improved capacity for loading  paraspinal/pelvic and gluteal mm as needed for performance of transferring, stair negotiation    Baseline 01/04/21: 4/5 hip flexors and abduction(seated), 4/5 quad and HS, weak hip ER.  02/06/21: Remaining weakness in quads and Hip ER.  03/08/21: Remaining weakness in hip ER only    Time 8    Period Weeks    Status Partially Met    Target Date 03/29/21      PT LONG TERM GOAL #3   Title Patient will perform stair negotiation in clinic, ascending and descending 4 steps with unilateral handrail support and no loss of balance or buckling independently without net increase in hip/back pain as needed for accessing home and community    Baseline 01/04/21: Significant pain with stair negotiation. 02/07/21: Deferred.  03/08/21: Performed safely with no LOB or buckling without increase in pain    Time 8    Period Weeks    Status Achieved    Target Date 03/08/21      PT LONG TERM GOAL #4   Title Patient will have no increase in pain with functional hip ROM in all planes as needed for lower limb management during self-care tasks, car transfers, and bed mobility tasks    Baseline 01/04/21: Pain with end-range hip ER and IR.   02/07/21: No pain wth hip PROM/AROM at this time, will assess next visit for delayed-onset pain/soreness.   03/08/21: Fleeting pain with end-range ER only    Time 6    Period Weeks    Status Partially Met    Target Date 03/29/21      PT LONG TERM GOAL #5   Title Patient will perform independent sit to stand from 19-20 inch chair with no UE support and no reproduction of hip pain indicative of improved ability to perform transferring    Baseline 01/04/21: difficulty and pain with sit to stand.   02/07/21: Performed IND without increase in pain with closed-chain upper limb extension to facilitate lifting of pelvis.  03/08/21: No change  from previous progress note    Time 6    Period Weeks    Status Partially Met    Target Date 03/29/21                   Plan - 03/16/21 1232      Clinical Impression Statement Patient is doing well at this time in spite of recent irritation of ischial region. Pt is continuing gentle gluteal isotonics and stretching at home along with icing. She has been able to progress with graded loading in clinic well over the last 2 sessions. Pt is able to perform repeated sidelying hip abduction without exacerbation of gluteal pain acutely. Pt will likely be able to transition to IND home program and self-management following remainder of her visits (given her current progress). Patient has remaining deficits in hip strength, functional mobility, difficulty with sit to stand, and L gluteal/ischial pain. Patient will benefit from continued skilled therapeutic intervention to address the above deficits as needed for improved function and QoL.    Personal Factors and Comorbidities Education;Comorbidity 3+;Age;Time since onset of injury/illness/exacerbation    Comorbidities AAA, AVN, HTN, Hx of bilateral LE edema, connective tissue disorder, Type 2 DM    Examination-Activity Limitations Locomotion Level;Squat;Stairs;Stand;Transfers;Bed Mobility    Examination-Participation Restrictions Driving;Community Activity;Cleaning    Stability/Clinical Decision Making Evolving/Moderate complexity    Rehab Potential Fair    PT Frequency 2x / week    PT Duration 8 weeks    PT Treatment/Interventions ADLs/Self Care Home Management;Electrical Stimulation;Moist Heat;Gait training;Stair training;Functional mobility training;Neuromuscular re-education;Balance training;Therapeutic exercise;Therapeutic activities;Patient/family education;Manual techniques;Passive range of motion    PT Next Visit Plan Manual therapy for desensitization and soft tissue mobility of affected gluteal musculature, gentle stretching.. Progress with gluteal activation, progressive loading and gradually increasing emphasis on standing and functional activities/CKC drills as tolerated.    PT Home Exercise  Plan Access Code [email or web address], aggressive icing for inflamed ischial bursa    Consulted and Agree with Plan of Care Patient             Patient will benefit from skilled therapeutic intervention in order to improve the following deficits and impairments:  Abnormal gait, Pain, Postural dysfunction, Decreased mobility, Decreased activity tolerance, Decreased endurance, Decreased range of motion, Decreased strength, Impaired flexibility, Difficulty walking  Visit Diagnosis: Pain in left hip  Difficulty in walking, not elsewhere classified  Muscle weakness (generalized)     Problem List Patient Active Problem List   Diagnosis Date Noted   Nausea 03/31/2020   Multiple thyroid nodules 12/28/2019   Thyroid nodule 09/29/2019   Lymphedema 11/10/2018   PAD (peripheral artery disease) ([place]) 10/12/2018   Aortic atherosclerosis ([place]) 10/12/2018   Carotid stenosis, asymptomatic, bilateral 10/12/2018   Positive colorectal cancer screening using Cologuard test 10/09/2018   Gastroesophageal reflux disease    Acute gastritis without hemorrhage    Osteoarthritis 04/29/2018   Hiatal hernia with GERD 04/29/2018   Hyperlipidemia 04/29/2018   Lymphedema of both lower extremities 04/29/2018   Left knee pain 10/23/2016   Cataracts, bilateral 07/31/2016   Glaucoma 07/31/2016   Lateral epicondylitis of left elbow 10/20/2015   Vitamin D deficiency 10/20/2015   Type 2 diabetes, controlled, with neuropathy ([place]) 10/16/2015   Gouty arthritis 06/03/2015   H/O syncope 06/03/2015   Mild intermittent asthma 06/03/2015   Multiple gastric ulcers 06/03/2015   Schatzki's ring 06/03/2015   Undifferentiated connective tissue disease ([place]) 06/03/2015   Bone disorder 04/05/2015   Dental injury 04/05/2015   Pericarditis 04/05/2015  Sjogren's syndrome ([place]) 09/07/2014   Elevated alkaline phosphatase level 08/24/2014   Lactose intolerance 08/24/2014   Obesity 08/24/2014   Peripheral edema 08/24/2014    Benign hypertension with CKD (chronic kidney disease) stage III 08/03/2014   Abdominal adhesions 08/02/2014   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 [place], [place], [address] Phone: [phone]   Fax:  [phone]  Name: Fiorela Pelzer MRN: [id number] Date of Birth: 1946/05/13

## 2021-03-20 ENCOUNTER — Encounter: Payer: Self-pay | Admitting: Physical Therapy

## 2021-03-20 ENCOUNTER — Ambulatory Visit: Payer: Medicare Other | Admitting: Physical Therapy

## 2021-03-20 ENCOUNTER — Other Ambulatory Visit: Payer: Self-pay

## 2021-03-20 DIAGNOSIS — M25552 Pain in left hip: Secondary | ICD-10-CM | POA: Diagnosis not present

## 2021-03-20 DIAGNOSIS — R262 Difficulty in walking, not elsewhere classified: Secondary | ICD-10-CM

## 2021-03-20 DIAGNOSIS — M6281 Muscle weakness (generalized): Secondary | ICD-10-CM

## 2021-03-20 NOTE — Therapy (Signed)
[facility] [address] [place], [place], [address] Phone: [phone]   Fax:  [phone]  Physical Therapy Treatment  Patient Details  Name: [name] MRN: [id number] Date of Birth: 1946/05/30 Referring Provider (PT): Rolanda Jay [place], [place]   Encounter Date: 03/20/2021   PT End of Session - 03/20/21 1810     Visit Number 17    Number of Visits 18    Date for PT Re-Evaluation 03/08/21    Authorization Time Period last prog note 9/28    Progress Note Due on Visit 18    PT Start Time 1806    PT Stop Time 1846    PT Time Calculation (min) 40 min    Activity Tolerance Patient tolerated treatment well;Patient limited by pain    Behavior During Therapy [facility] for tasks assessed/performed             Past Medical History:  Diagnosis Date   Anterolisthesis    Cervical spine   Asthma    Connective tissue disorder ([place])    recurrent carotid arteritis, temporal arteritis, vasculitis mandible, general hepatitis, avascular necrosis bilat   DDD (degenerative disc disease), lumbar    Diabetes mellitus without complication ([place])    Diverticulosis    Duodenitis    Ganglion cyst of right foot    Gouty arthritis    Hiatal hernia with GERD    History of cardiovascular stress test    a. 07/2018 [facility]): Fixed inferoapical defect w/ nl contraction-->attenuation artifact. No ischemia. EF 63%.   Hyperlipidemia    Hypertension    a. 02/2019 Renal artery duplex: no evidence of prox RAS.   Hyperuricemia    Keratitis sicca, bilateral (HCC)    Lymphedema of both lower extremities    Osteoarthritis    Pericarditis    Recurrent: Aug 97, July 05, Sept 08, July 16, July 18   PUD (peptic ulcer disease)    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[place])    Sjogren's syndrome ([place])    Syncope    Tendonitis, Achilles, right    Tortuous colon    Trigger finger     Past Surgical History:  Procedure Laterality Date   ABDOMINAL HYSTERECTOMY      BREAST BIOPSY     BREAST EXCISIONAL BIOPSY Left 1986   neg   CHOLECYSTECTOMY     COLONOSCOPY WITH PROPOFOL N/A 08/26/2018   Procedure: COLONOSCOPY WITH PROPOFOL;  Surgeon: Lucilla Lame, MD;  Location: [facility] ENDOSCOPY;  Service: Endoscopy;  Laterality: N/A;   CYSTOSCOPY  02/26/2018   [name], MD    distal arthrectomy     ESOPHAGOGASTRODUODENOSCOPY (EGD) WITH PROPOFOL N/A 08/26/2018   Procedure: ESOPHAGOGASTRODUODENOSCOPY (EGD) WITH PROPOFOL;  Surgeon: Lucilla Lame, MD;  Location: [facility] ENDOSCOPY;  Service: Endoscopy;  Laterality: N/A;   EXCISION NEUROMA     KNEE SURGERY Bilateral    1985, 2001, 11/2002, 12/2006   panhysterectomy  10/1983   SHOULDER SURGERY Right    reverse total arthroplasty w/ biceps tenodesis   TONSILLECTOMY AND ADENOIDECTOMY     TOTAL HIP ARTHROPLASTY Right 04/2007   WISDOM TOOTH EXTRACTION      There were no vitals filed for this visit.   Subjective Assessment - 03/20/21 1807     Subjective Patient reports no recent blood pressure readings > 200/100. Patient reports L gluteal/ischial region is "okay" at rest. She reports soreness along lateral hip and some deep gluteal mild pain at arrival to PT. Patient reports 1/10 symptoms at  arrival to PT.    Pertinent History Patient is a 75 year old female with previous episode of PT in this clinic related to low back pain. Patient has current primary complaint of L hip pain. Patient reports she was going ot sit onto toilet and was going to sit and fell into sitting in uncontrolled manner with weight shifted onto her L hip (DOI: 12/08/2020). She states she had pain at the time that worsened more the following day. Patient reports history of bursitis in L hip with multiple episode since the mid 1980s. Patient reports pain affecting posteiror gluteal and L SI region. Pt has history of L AVN. Patient had radiographs this past Thursday - evidence of degenerative changes and known lumbar DDD. Patient reports 4/10 presently. Patient  reports 10/10 pain at the worst. Patient reports no pain lower than 4/10. Patient denies numbness and tingling. Patient reports lower pain if she doesn't "use it." Aggravating factors: walking, more weightbearing, stair ascent>descent.. Easing factors: resting it, "not doing anything." Hx of R rTSA, R THA, L partial knee replacement. Goal: get rid of pain. She reports pain with every step of walking.    Limitations Walking;Standing;House hold activities    Patient Stated Goals Decrease back pain/ improve strength/ mobility.    Pain Onset --   30 years                TREATMENT PERFORMED     NuStep x 5 minutes for improved soft tissue extensibility/mobility prior to manual therapy and exercise; Level 3, seat 11 - unbilled time       Manual Therapy - for symptom modulation, soft tissue sensitivity and mobility, joint mobility, ROM   (In supine) Long-axis distraction LLE, gr II for pain relief   In R sidelying, IASTM using Hypervolt to L gluteus medius and minimus    *not today* DTM/Tpr at L piriformis (In supine) L Hip PROM within pt tolerance, passive piriformis stretch 3x30sec Atttempted use of Mulligan distraction, poor tolerance of belt on adductors with folded towel to cushion belt (D/C)          Therapeutic Exercise - for improved soft tissue mobility, lumbopelvic/hip mobility, isometrics for analgesic effect     Sidelying Hip Abduction; 2x10, 1.5-lb ankle weight Sidestep with Theraband, Red Tband at ankles; 4x D/B in // bars Lower trunk rotations; x10 each direction  3-way hip, standing adjacent to table; x10 ea dir, standing on LLE     *not today* Supine Bridge with Blue Tband Abduction; 2x8 Supine piriformis stretch; 2x30sec Sit to stand, raised table; 2x8 Sidelying clamshell, sidelying R; x10 with light manual resistance and emphasis on eccentrics Hip ADD isometrics, with belt and ball; 2x10, 5 sec Hip ABD and ADD isometrics, with belt and ball; x10, 5 sec          ASSESSMENT Patient has relatively low level of hip/gluteal pain at this time and has tolerated progression of gluteal strengthening well. Patient has full hip ROM with only pain with end-range ER that has persisted over the last 3 weeks. She does not have significant point tenderness over ischial tuberosity this evening. Patient is tolerating weightbearing activity better; she is somewhat limited by comorbid R knee pain. Patient has remaining deficits in hip strength, functional mobility, difficulty with sit to stand, and L gluteal/ischial pain. Patient will benefit from continued skilled therapeutic intervention to address the above deficits as needed for improved function and QoL.        PT Short Term Goals -  03/08/21 1645       PT SHORT TERM GOAL #1   Title Pt will be independent and 100% compliant with established HEP and activity modification as needed to augment PT intervention and prevent flare-up of hip pain as needed for best return to prior level of function.    Baseline 01/04/21: discussed palliative care and activity modification; initiating HEP next visit. 02/06/21: fair compliance with HEP.  03/08/21: mostly compliant with HEP with intermittent non-compliance    Time 3    Period Weeks    Status Partially Met    Target Date 02/14/21               PT Long Term Goals - 03/09/21 1614       PT LONG TERM GOAL #1   Title Patient will demonstrate improved function as evidenced by a score of 47 on FOTO measure for full participation in activities at home and in the community.    Baseline 01/04/21: FOTO 27.   02/06/21: FOTO 57.   03/08/21: 55    Time 8    Period Weeks    Status Achieved    Target Date 03/01/21      PT LONG TERM GOAL #2   Title Patient will have hip strength to 4+/5 or greater for all motions tested without reproduction of pain indicative of improved capacity for loading paraspinal/pelvic and gluteal mm as needed for performance of transferring, stair  negotiation    Baseline 01/04/21: 4/5 hip flexors and abduction(seated), 4/5 quad and HS, weak hip ER.  02/06/21: Remaining weakness in quads and Hip ER.  03/08/21: Remaining weakness in hip ER only    Time 8    Period Weeks    Status Partially Met    Target Date 03/29/21      PT LONG TERM GOAL #3   Title Patient will perform stair negotiation in clinic, ascending and descending 4 steps with unilateral handrail support and no loss of balance or buckling independently without net increase in hip/back pain as needed for accessing home and community    Baseline 01/04/21: Significant pain with stair negotiation. 02/07/21: Deferred.  03/08/21: Performed safely with no LOB or buckling without increase in pain    Time 8    Period Weeks    Status Achieved    Target Date 03/08/21      PT LONG TERM GOAL #4   Title Patient will have no increase in pain with functional hip ROM in all planes as needed for lower limb management during self-care tasks, car transfers, and bed mobility tasks    Baseline 01/04/21: Pain with end-range hip ER and IR.   02/07/21: No pain wth hip PROM/AROM at this time, will assess next visit for delayed-onset pain/soreness.   03/08/21: Fleeting pain with end-range ER only    Time 6    Period Weeks    Status Partially Met    Target Date 03/29/21      PT LONG TERM GOAL #5   Title Patient will perform independent sit to stand from 19-20 inch chair with no UE support and no reproduction of hip pain indicative of improved ability to perform transferring    Baseline 01/04/21: difficulty and pain with sit to stand.   02/07/21: Performed IND without increase in pain with closed-chain upper limb extension to facilitate lifting of pelvis.  03/08/21: No change from previous progress note    Time 6    Period Weeks    Status Partially Met  Target Date 03/29/21                   Plan - 03/21/21 1210     Clinical Impression Statement Patient has relatively low level of hip/gluteal  pain at this time and has tolerated progression of gluteal strengthening well. Patient has full hip ROM with only pain with end-range ER that has persisted over the last 3 weeks. She does not have significant point tenderness over ischial tuberosity this evening. Patient is tolerating weightbearing activity better; she is somewhat limited by comorbid R knee pain. Patient has remaining deficits in hip strength, functional mobility, difficulty with sit to stand, and L gluteal/ischial pain. Patient will benefit from continued skilled therapeutic intervention to address the above deficits as needed for improved function and QoL.    Personal Factors and Comorbidities Education;Comorbidity 3+;Age;Time since onset of injury/illness/exacerbation    Comorbidities AAA, AVN, HTN, Hx of bilateral LE edema, connective tissue disorder, Type 2 DM    Examination-Activity Limitations Locomotion Level;Squat;Stairs;Stand;Transfers;Bed Mobility    Examination-Participation Restrictions Driving;Community Activity;Cleaning    Stability/Clinical Decision Making Evolving/Moderate complexity    Rehab Potential Fair    PT Frequency 2x / week    PT Duration 8 weeks    PT Treatment/Interventions ADLs/Self Care Home Management;Electrical Stimulation;Moist Heat;Gait training;Stair training;Functional mobility training;Neuromuscular re-education;Balance training;Therapeutic exercise;Therapeutic activities;Patient/family education;Manual techniques;Passive range of motion    PT Next Visit Plan Manual therapy for desensitization and soft tissue mobility of affected gluteal musculature, gentle stretching.. Progress with gluteal activation, progressive loading and gradually increasing emphasis on standing and functional activities/CKC drills as tolerated.    PT Home Exercise Plan Access Code [email or web address], aggressive icing for inflamed ischial bursa    Consulted and Agree with Plan of Care Patient             Patient will benefit from  skilled therapeutic intervention in order to improve the following deficits and impairments:  Abnormal gait, Pain, Postural dysfunction, Decreased mobility, Decreased activity tolerance, Decreased endurance, Decreased range of motion, Decreased strength, Impaired flexibility, Difficulty walking  Visit Diagnosis: Pain in left hip  Difficulty in walking, not elsewhere classified  Muscle weakness (generalized)     Problem List Patient Active Problem List   Diagnosis Date Noted   Nausea 03/31/2020   Multiple thyroid nodules 12/28/2019   Thyroid nodule 09/29/2019   Lymphedema 11/10/2018   PAD (peripheral artery disease) ([place]) 10/12/2018   Aortic atherosclerosis ([place]) 10/12/2018   Carotid stenosis, asymptomatic, bilateral 10/12/2018   Positive colorectal cancer screening using Cologuard test 10/09/2018   Gastroesophageal reflux disease    Acute gastritis without hemorrhage    Osteoarthritis 04/29/2018   Hiatal hernia with GERD 04/29/2018   Hyperlipidemia 04/29/2018   Lymphedema of both lower extremities 04/29/2018   Left knee pain 10/23/2016   Cataracts, bilateral 07/31/2016   Glaucoma 07/31/2016   Lateral epicondylitis of left elbow 10/20/2015   Vitamin D deficiency 10/20/2015   Type 2 diabetes, controlled, with neuropathy ([place]) 10/16/2015   Gouty arthritis 06/03/2015   H/O syncope 06/03/2015   Mild intermittent asthma 06/03/2015   Multiple gastric ulcers 06/03/2015   Schatzki's ring 06/03/2015   Undifferentiated connective tissue disease ([place]) 06/03/2015   Bone disorder 04/05/2015   Dental injury 04/05/2015   Pericarditis 04/05/2015   Sjogren's syndrome ([place]) 09/07/2014   Elevated alkaline phosphatase level 08/24/2014   Lactose intolerance 08/24/2014   Obesity 08/24/2014   Peripheral edema 08/24/2014   Benign hypertension with CKD (chronic kidney disease) stage III 08/03/2014  Abdominal adhesions 08/02/2014   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 [place], [place], [address] Phone: [phone]   Fax:  [phone]  Name: Rmani Kapusta MRN: [id number] Date of Birth: 27-Mar-1946

## 2021-03-22 ENCOUNTER — Ambulatory Visit: Payer: Medicare Other | Admitting: Physical Therapy

## 2021-03-22 ENCOUNTER — Other Ambulatory Visit: Payer: Self-pay

## 2021-03-22 ENCOUNTER — Encounter: Payer: Self-pay | Admitting: Physical Therapy

## 2021-03-22 DIAGNOSIS — R262 Difficulty in walking, not elsewhere classified: Secondary | ICD-10-CM

## 2021-03-22 DIAGNOSIS — M25552 Pain in left hip: Secondary | ICD-10-CM

## 2021-03-22 DIAGNOSIS — M6281 Muscle weakness (generalized): Secondary | ICD-10-CM

## 2021-03-22 NOTE — Therapy (Signed)
[facility] [address] [place], [place], [address] Phone: [phone]   Fax:  [phone]  Physical Therapy Treatment/Goal Update and Discharge  Patient Details  Name: [name] MRN: [id number] Date of Birth: [birth year] Referring Provider (PT): Rolanda Jay [place], [place]   Encounter Date: 03/22/2021   PT End of Session - 03/22/21 1731     Visit Number 18    Number of Visits 18    Date for PT Re-Evaluation 03/08/21    Authorization Time Period last prog note 9/28    Progress Note Due on Visit 18    PT Start Time 1724    PT Stop Time 1800    PT Time Calculation (min) 36 min    Activity Tolerance Patient tolerated treatment well;Patient limited by pain    Behavior During Therapy [facility] for tasks assessed/performed             Past Medical History:  Diagnosis Date   Anterolisthesis    Cervical spine   Asthma    Connective tissue disorder ([place])    recurrent carotid arteritis, temporal arteritis, vasculitis mandible, general hepatitis, avascular necrosis bilat   DDD (degenerative disc disease), lumbar    Diabetes mellitus without complication ([place])    Diverticulosis    Duodenitis    Ganglion cyst of right foot    Gouty arthritis    Hiatal hernia with GERD    History of cardiovascular stress test    a. 07/2018 [facility]): Fixed inferoapical defect w/ nl contraction-->attenuation artifact. No ischemia. EF 63%.   Hyperlipidemia    Hypertension    a. 02/2019 Renal artery duplex: no evidence of prox RAS.   Hyperuricemia    Keratitis sicca, bilateral (HCC)    Lymphedema of both lower extremities    Osteoarthritis    Pericarditis    Recurrent: Aug 97, July 05, Sept 08, July 16, July 18   PUD (peptic ulcer disease)    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[place])    Sjogren's syndrome ([place])    Syncope    Tendonitis, Achilles, right    Tortuous colon    Trigger finger     Past Surgical History:  Procedure Laterality Date    ABDOMINAL HYSTERECTOMY     BREAST BIOPSY     BREAST EXCISIONAL BIOPSY Left 1986   neg   CHOLECYSTECTOMY     COLONOSCOPY WITH PROPOFOL N/A 08/26/2018   Procedure: COLONOSCOPY WITH PROPOFOL;  Surgeon: Lucilla Lame, MD;  Location: [facility] ENDOSCOPY;  Service: Endoscopy;  Laterality: N/A;   CYSTOSCOPY  02/26/2018   [name], MD    distal arthrectomy     ESOPHAGOGASTRODUODENOSCOPY (EGD) WITH PROPOFOL N/A 08/26/2018   Procedure: ESOPHAGOGASTRODUODENOSCOPY (EGD) WITH PROPOFOL;  Surgeon: Lucilla Lame, MD;  Location: [facility] ENDOSCOPY;  Service: Endoscopy;  Laterality: N/A;   EXCISION NEUROMA     KNEE SURGERY Bilateral    1985, 2001, 11/2002, 12/2006   panhysterectomy  10/1983   SHOULDER SURGERY Right    reverse total arthroplasty w/ biceps tenodesis   TONSILLECTOMY AND ADENOIDECTOMY     TOTAL HIP ARTHROPLASTY Right 04/2007   WISDOM TOOTH EXTRACTION      There were no vitals filed for this visit.   Subjective Assessment - 03/22/21 1728     Subjective Patient reports BP has not been > 200/100. Patient reports feeling relatively well with her transdermal medication for BP. Patient reports gluteal region feels good when she is on the move. She states it was bad  this AM. Patient reports her deep gluteal region can hurt significantly with prolonged sitting. Patient is comfortable with continuing with home exercise program following today.    Pertinent History Patient is a 75 year old female with previous episode of PT in this clinic related to low back pain. Patient has current primary complaint of L hip pain. Patient reports she was going ot sit onto toilet and was going to sit and fell into sitting in uncontrolled manner with weight shifted onto her L hip (DOI: 12/08/2020). She states she had pain at the time that worsened more the following day. Patient reports history of bursitis in L hip with multiple episode since the mid 1980s. Patient reports pain affecting posteiror gluteal and L SI region. Pt has  history of L AVN. Patient had radiographs this past Thursday - evidence of degenerative changes and known lumbar DDD. Patient reports 4/10 presently. Patient reports 10/10 pain at the worst. Patient reports no pain lower than 4/10. Patient denies numbness and tingling. Patient reports lower pain if she doesn't "use it." Aggravating factors: walking, more weightbearing, stair ascent>descent.. Easing factors: resting it, "not doing anything." Hx of R rTSA, R THA, L partial knee replacement. Goal: get rid of pain. She reports pain with every step of walking.    Limitations Walking;Standing;House hold activities    Patient Stated Goals Decrease back pain/ improve strength/ mobility.    Pain Onset --   30 years                OBJECTIVE FINDINGS   Gait Mild forward flexed posture throughout gait cycle, otherwise unremarkable   Palpation Tenderness to palpation along L greater trochanter 2+, L gluteus medius 2+    Strength (out of 5) R/L 4+/5 Hip flexion 4+/4+ Hip ER 5/5 Hip IR 5/5 Hip abduction (seated) 5/5 Hip adduction 4+/4+ Knee extension 5/5 Knee flexion *Indicates pain     PROM (degrees) R/L Hip flexion: Not tested/120 Hip external rotation: Not tested/45 Hip internal rotation: Not tested/WFL Hip abduction: Not tested/WNL     Functional Tasks Sit to stand: Performed with no UE support from raised table and standard chair with no pain behaviors or LOB   Stair negotiation: IND stair ascent with unilateral L handrail support; reciprocal pattern for ascent, descending with LLE leading         TREATMENT PERFORMED     NuStep x 4 minutes for improved soft tissue extensibility/mobility prior to manual therapy and exercise; Level 3, seat 11 - unbilled time      Therapeutic Activities  Re-assessment performed (see above)    Manual Therapy - for symptom modulation, soft tissue sensitivity and mobility, joint mobility, ROM   (In supine) Long-axis distraction LLE, gr  II for pain relief   In R sidelying, IASTM using Hypervolt to L gluteus medius and maximus     *not today* DTM/Tpr at L piriformis (In supine) L Hip PROM within pt tolerance, passive piriformis stretch 3x30sec Atttempted use of Mulligan distraction, poor tolerance of belt on adductors with folded towel to cushion belt (D/C)           ASSESSMENT Patient has achieved FOTO goal with last two measurements taken, long-term strength goal, performance-based goals for sit to stand and safe independent stair negotiation, and hip ROM goal. She is doing well with pain control, though she does have trochanteric discomfort with sidelying in the AM intermittently. She has no significant ischial tenderness to palpation at this time. She reports minimal pain over previous  2 weeks at arrival to clinic (symptoms are better as she moves throughout the day), though she has experienced intermittent pain in AM with rolling. Patient has progressed with HEP and tolerates gluteus medius and general hip strengthening drills well. Given her current progress and robust HEP, pt is appropriate for transition to continued home exercise and discharge from current formal PT POC.       PT Short Term Goals - 03/23/21 1448       PT SHORT TERM GOAL #1   Title Pt will be independent and 100% compliant with established HEP and activity modification as needed to augment PT intervention and prevent flare-up of hip pain as needed for best return to prior level of function.    Baseline 01/04/21: discussed palliative care and activity modification; initiating HEP next visit. 02/06/21: fair compliance with HEP.  03/08/21: mostly compliant with HEP with intermittent non-compliance.   03/22/21: Pt has intermittent HEP compliance    Time 3    Period Weeks    Status Partially Met    Target Date 02/14/21               PT Long Term Goals - 03/23/21 1449       PT LONG TERM GOAL #1   Title Patient will demonstrate improved function  as evidenced by a score of 47 on FOTO measure for full participation in activities at home and in the community.    Baseline 01/04/21: FOTO 27.   02/06/21: FOTO 57.   03/08/21: 55    Time 8    Period Weeks    Status Achieved    Target Date 03/01/21      PT LONG TERM GOAL #2   Title Patient will have hip strength to 4+/5 or greater for all motions tested without reproduction of pain indicative of improved capacity for loading paraspinal/pelvic and gluteal mm as needed for performance of transferring, stair negotiation    Baseline 01/04/21: 4/5 hip flexors and abduction(seated), 4/5 quad and HS, weak hip ER.  02/06/21: Remaining weakness in quads and Hip ER.  03/08/21: Remaining weakness in hip ER only.   03/22/21: Met for all motions    Time 8    Period Weeks    Status Achieved    Target Date 03/22/21      PT LONG TERM GOAL #3   Title Patient will perform stair negotiation in clinic, ascending and descending 4 steps with unilateral handrail support and no loss of balance or buckling independently without net increase in hip/back pain as needed for accessing home and community    Baseline 01/04/21: Significant pain with stair negotiation. 02/07/21: Deferred.  03/08/21: Performed safely with no LOB or buckling without increase in pain    Time 8    Period Weeks    Status Achieved    Target Date 03/08/21      PT LONG TERM GOAL #4   Title Patient will have no increase in pain with functional hip ROM in all planes as needed for lower limb management during self-care tasks, car transfers, and bed mobility tasks    Baseline 01/04/21: Pain with end-range hip ER and IR.   02/07/21: No pain wth hip PROM/AROM at this time, will assess next visit for delayed-onset pain/soreness.   03/08/21: Fleeting pain with end-range ER only.  03/22/21: No pain with hip PROM    Time 6    Period Weeks    Status Achieved    Target Date 03/22/21  PT LONG TERM GOAL #5   Title Patient will perform independent sit to stand  from 19-20 inch chair with no UE support and no reproduction of hip pain indicative of improved ability to perform transferring    Baseline 01/04/21: difficulty and pain with sit to stand.   02/07/21: Performed IND without increase in pain with closed-chain upper limb extension to facilitate lifting of pelvis.  03/08/21: No change from previous progress note.  03/22/21: Perfomed today with no LOB and no reproduction of lower quarter pain.    Time 6    Period Weeks    Status Achieved    Target Date 03/22/21                   Plan - 03/23/21 1458     Clinical Impression Statement Patient has achieved FOTO goal with last two measurements taken, long-term strength goal, performance-based goals for sit to stand and safe independent stair negotiation, and hip ROM goal. She is doing well with pain control, though she does have trochanteric discomfort with sidelying in the AM intermittently. She has no significant ischial tenderness to palpation at this time. She reports minimal pain over previous 2 weeks at arrival to clinic (symptoms are better as she moves throughout the day), though she has experienced intermittent pain in AM with rolling. Patient has progressed with HEP and tolerates gluteus medius and general hip strengthening drills well. Given her current progress and robust HEP, pt is appropriate for transition to continued home exercise and discharge from current formal PT POC.    Personal Factors and Comorbidities Education;Comorbidity 3+;Age;Time since onset of injury/illness/exacerbation    Comorbidities AAA, AVN, HTN, Hx of bilateral LE edema, connective tissue disorder, Type 2 DM    Examination-Activity Limitations Locomotion Level;Squat;Stairs;Stand;Transfers;Bed Mobility    Examination-Participation Restrictions Driving;Community Activity;Cleaning    Stability/Clinical Decision Making Evolving/Moderate complexity    PT Treatment/Interventions ADLs/Self Care Home Management;Electrical  Stimulation;Moist Heat;Gait training;Stair training;Functional mobility training;Neuromuscular re-education;Balance training;Therapeutic exercise;Therapeutic activities;Patient/family education;Manual techniques;Passive range of motion    PT Next Visit Plan Continued home exercise and discharge from current formal PT POC.    PT Home Exercise Plan Access Code [email or web address]    Consulted and Agree with Plan of Care Patient             Patient will benefit from skilled therapeutic intervention in order to improve the following deficits and impairments:  Abnormal gait, Pain, Postural dysfunction, Decreased mobility, Decreased activity tolerance, Decreased endurance, Decreased range of motion, Decreased strength, Impaired flexibility, Difficulty walking  Visit Diagnosis: Pain in left hip  Difficulty in walking, not elsewhere classified  Muscle weakness (generalized)     Problem List Patient Active Problem List   Diagnosis Date Noted   Nausea 03/31/2020   Multiple thyroid nodules 12/28/2019   Thyroid nodule 09/29/2019   Lymphedema 11/10/2018   PAD (peripheral artery disease) ([place]) 10/12/2018   Aortic atherosclerosis ([place]) 10/12/2018   Carotid stenosis, asymptomatic, bilateral 10/12/2018   Positive colorectal cancer screening using Cologuard test 10/09/2018   Gastroesophageal reflux disease    Acute gastritis without hemorrhage    Osteoarthritis 04/29/2018   Hiatal hernia with GERD 04/29/2018   Hyperlipidemia 04/29/2018   Lymphedema of both lower extremities 04/29/2018   Left knee pain 10/23/2016   Cataracts, bilateral 07/31/2016   Glaucoma 07/31/2016   Lateral epicondylitis of left elbow 10/20/2015   Vitamin D deficiency 10/20/2015   Type 2 diabetes, controlled, with neuropathy ([place]) 10/16/2015   Gouty arthritis 06/03/2015  H/O syncope 06/03/2015   Mild intermittent asthma 06/03/2015   Multiple gastric ulcers 06/03/2015   Schatzki's ring 06/03/2015   Undifferentiated  connective tissue disease ([place]) 06/03/2015   Bone disorder 04/05/2015   Dental injury 04/05/2015   Pericarditis 04/05/2015   Sjogren's syndrome ([place]) 09/07/2014   Elevated alkaline phosphatase level 08/24/2014   Lactose intolerance 08/24/2014   Obesity 08/24/2014   Peripheral edema 08/24/2014   Benign hypertension with CKD (chronic kidney disease) stage III 08/03/2014   Abdominal adhesions 08/02/2014   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 [place], [place], [address] Phone: [phone]   Fax:  [phone]  Name: [name] MRN: [id number] Date of Birth: [birth year]

## 2021-03-30 ENCOUNTER — Telehealth: Payer: Self-pay | Admitting: Cardiovascular Disease

## 2021-03-30 NOTE — Telephone Encounter (Signed)
Pt c/o Syncope: STAT if syncope occurred within 30 minutes and pt complains of lightheadedness High Priority if episode of passing out, completely, today or in last 24 hours   Did you pass out today? No  When is the last time you passed out? Sunday afternoon - fainted in shower   Has this occurred multiple times? no   Did you have any symptoms prior to passing out? Just didn't / doesn't feel well

## 2021-03-31 NOTE — Telephone Encounter (Addendum)
Was able to reach back out to [name] to f/u on her syncopal episode. Pt reports was taking a shower and "passed out", roommate head her fall, she was unresponsive, so roommate called EMS. EMS arrived pt was back to baseline, EMS reports EKG and vitals WNL. BP "best it ever been", 126/70 and HR "good, I' don't remember the number". Not transported to ED.  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 Pt reports this was "different" as "it just happen". Pt did NOT have any symptoms leading up to syncopal episode, no sweating, hot flashes, no CP, no dizziness, or SOB. Reports only concern is her BP may have drop as she "always have high BP and it was low for me at 126/70". Pt feels fine now. But did not "feel right that day"  Pt spent time with nurse reviewing ALL her medical history and past cardiac testing. Unsure if her right carotid is the cause for her syncopal episode, reports "at times, I been over and I feel as if I am going to keep going and hit the floor".  Right carotid is 40-59 % stenosis form carotid u/s 07/18/2020. Pt question an ECHO, last ECHO was several years ago at [facility].  Reports has had a few issues since having COVID vaccine "I almost died from it".   Advised needs appt to schedule to discuss need for ECHO, pt agreeable, okay to see APP, and would like an appt to f/u with Dr. Rockey Situ after APP and ECHO results.   Advised on time when need to seek ED for future syncopal episodes, pt reports "Dr. Rockey Situ states do not go to the ED, I should call the doctor on call when I have cardiac issues". Advised pt sometimes cannot get in same day for an evaluation when you have cardiac symptoms, best to seek ED< they can always order cardiac consult and have cardiologist on call consult her regarding her symptoms, if not Dr. Rockey Situ who is on call that day, he will be able to see consult notes to review and weigh in if needed. [name] verbalized understanding.  Pt  reports Dr. Doy Hutching has placed her on a Clonidine 0.1 mg patch she wears weekly, still has her clonidine 0.1 mg tabs for PRN needs for HTN.   Updated pt's med list with clonidine patch.   Appt 11/8 [name] 11/28 Dr. Rockey Situ   [name] thankful for speaking with her for 28 min phone call, Otherwise all questions or concerns were address and no additional concerns at this time. Agreeable to plan, will call back for anything further.

## 2021-03-31 NOTE — Telephone Encounter (Signed)
Patient is returning your call.  

## 2021-03-31 NOTE — Telephone Encounter (Signed)
Attempted to reach out to pt to f/u on her call in from yesterday. No answer, LMTCB

## 2021-04-03 ENCOUNTER — Other Ambulatory Visit: Payer: Self-pay | Admitting: Family Medicine

## 2021-04-03 DIAGNOSIS — E7800 Pure hypercholesterolemia, unspecified: Secondary | ICD-10-CM

## 2021-04-03 NOTE — Telephone Encounter (Signed)
Patient has another provider listed as PCP now- attempted to call patient- but did not get answer. Requested Prescriptions  Pending Prescriptions Disp Refills  . rosuvastatin (CRESTOR) 10 MG tablet [Pharmacy Med Name: Rosuvastatin Calcium 10 MG Oral Tablet] 90 tablet 3    Sig: TAKE 1 TABLET BY MOUTH  DAILY     Cardiovascular:  Antilipid - Statins Failed - 04/03/2021  7:00 AM      Failed - Total Cholesterol in normal range and within 360 days    Cholesterol  Date Value Ref Range Status  07/06/2020 256 (H) 0 - 200 mg/dL Final         Failed - LDL in normal range and within 360 days    LDL Cholesterol (Calc)  Date Value Ref Range Status  02/10/2019 100 (H) mg/dL (calc) Final    Comment:    Reference range: <100 . Desirable range <100 mg/dL for primary prevention;   <70 mg/dL for patients with CHD or diabetic patients  with > or = 2 CHD risk factors. Marland Kitchen LDL-C is now calculated using the Martin-Hopkins  calculation, which is a validated novel method providing  better accuracy than the Friedewald equation in the  estimation of LDL-C.  Cresenciano Genre et al. Annamaria Helling. 8676;720(94): 2061-2068  (http://education.[employer].com/faq/FAQ164)    LDL Cholesterol  Date Value Ref Range Status  07/06/2020 166 (H) 0 - 99 mg/dL Final    Comment:           Total Cholesterol/HDL:CHD Risk Coronary Heart Disease Risk Table                     Men   Women  1/2 Average Risk   3.4   3.3  Average Risk       5.0   4.4  2 X Average Risk   9.6   7.1  3 X Average Risk  23.4   11.0        Use the calculated Patient Ratio above and the CHD Risk Table to determine the patient's CHD Risk.        ATP III CLASSIFICATION (LDL):  <100     mg/dL   Optimal  100-129  mg/dL   Near or Above                    Optimal  130-159  mg/dL   Borderline  160-189  mg/dL   High  >190     mg/dL   Very High Performed at [address] [place], [place] [address]    Direct LDL  Date Value Ref  Range Status  12/07/2019 77.1 0 - 99 mg/dL Final    Comment:    Performed at [facility] [address], [place], [place] [address]         Failed - Triglycerides in normal range and within 360 days    Triglycerides  Date Value Ref Range Status  07/06/2020 220 (H) <150 mg/dL Final         Failed - Valid encounter within last 12 months    Recent Outpatient Visits          1 year ago Type 2 diabetes, controlled, with neuropathy [facility])   [facility], [name], FNP   1 year ago Type 2 diabetes, controlled, with neuropathy [facility])   [facility], [name], FNP   1 year ago Thyroid nodule   [facility], [name], FNP   1 year ago Type 2 diabetes, controlled, with neuropathy [facility])   [facility] Olin Hauser, DO   1 year ago Benign 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

## 2021-04-18 ENCOUNTER — Other Ambulatory Visit: Payer: Self-pay

## 2021-04-18 ENCOUNTER — Ambulatory Visit (INDEPENDENT_AMBULATORY_CARE_PROVIDER_SITE_OTHER): Payer: Medicare Other | Admitting: Medical

## 2021-04-18 ENCOUNTER — Encounter: Payer: Self-pay | Admitting: Medical

## 2021-04-18 ENCOUNTER — Other Ambulatory Visit
Admission: RE | Admit: 2021-04-18 | Discharge: 2021-04-18 | Disposition: A | Discharge status: Home / Self Care | Payer: Medicare Other | Source: Ambulatory Visit | Attending: Medical | Admitting: Medical

## 2021-04-18 ENCOUNTER — Ambulatory Visit (INDEPENDENT_AMBULATORY_CARE_PROVIDER_SITE_OTHER): Payer: Medicare Other

## 2021-04-18 VITALS — BP 140/70 | HR 68 | Ht 69.0 in | Wt 232.0 lb

## 2021-04-18 DIAGNOSIS — R55 Syncope and collapse: Secondary | ICD-10-CM

## 2021-04-18 DIAGNOSIS — E782 Mixed hyperlipidemia: Secondary | ICD-10-CM | POA: Diagnosis not present

## 2021-04-18 DIAGNOSIS — I10 Essential (primary) hypertension: Secondary | ICD-10-CM | POA: Diagnosis present

## 2021-04-18 DIAGNOSIS — I700 Atherosclerosis of aorta: Secondary | ICD-10-CM | POA: Diagnosis not present

## 2021-04-18 DIAGNOSIS — I6523 Occlusion and stenosis of bilateral carotid arteries: Secondary | ICD-10-CM

## 2021-04-18 LAB — CBC
HCT: 43.3 % (ref 36.0–46.0)
Hemoglobin: 14.5 g/dL (ref 12.0–15.0)
MCH: 28.3 pg (ref 26.0–34.0)
MCHC: 33.5 g/dL (ref 30.0–36.0)
MCV: 84.4 fL (ref 80.0–100.0)
Platelets: 281 10*3/uL (ref 150–400)
RBC: 5.13 MIL/uL — ABNORMAL HIGH (ref 3.87–5.11)
RDW: 13.1 % (ref 11.5–15.5)
WBC: 11.8 10*3/uL — ABNORMAL HIGH (ref 4.0–10.5)
nRBC: 0 % (ref 0.0–0.2)

## 2021-04-18 LAB — BASIC METABOLIC PANEL
Anion gap: 8 (ref 5–15)
BUN: 21 mg/dL (ref 8–23)
CO2: 27 mmol/L (ref 22–32)
Calcium: 9 mg/dL (ref 8.9–10.3)
Chloride: 102 mmol/L (ref 98–111)
Creatinine, Ser: 0.99 mg/dL (ref 0.44–1.00)
GFR, Estimated: 60 mL/min — ABNORMAL LOW (ref >60)
Glucose, Bld: 120 mg/dL — ABNORMAL HIGH (ref 70–99)
Potassium: 4.2 mmol/L (ref 3.5–5.1)
Sodium: 137 mmol/L (ref 135–145)

## 2021-04-18 LAB — MAGNESIUM: Magnesium: 1.9 mg/dL (ref 1.7–2.4)

## 2021-04-18 LAB — TSH: TSH: 0.766 u[IU]/mL (ref 0.350–4.500)

## 2021-04-18 NOTE — Progress Notes (Signed)
Cardiology Office Note:    Date:  04/18/2021   ID:  [name] 1945-10-02, MRN [id number]  PCP:  Patricia Crouch, MD  [facility] Cardiologist:  None  [facility] Electrophysiologist:  None   Referring MD: Patricia Crouch, MD   Chief Complaint: fainting episode  History of Present Illness:    [name] is a 75 y.o. female with a hx of PAD/carotid disease, HTN, urinary incontinence, chronic UTIs, morbid obesity, moderate to large hiatal hernia, bleeding ulcers x3, aortic atherosclerosis on CT scan, lymphedema, venous insufficiency who presents for syncope.   History of multiple antihypertensives.  Including HCTZ, Amlodipine, Hydralazine, Lisinopril, Metoprolol. Renal duplex 02/2019 no evidence of renal artery s tenosis.    During virtual visit 11/04/2019 she was recommended to start losartan 25 mg daily and continue her Bystolic 10 mg daily.  She was also given clonidine 0.1 mg as needed for breakthrough hypertension.   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  Recommended for repeat CT chest in 1 year.  Goal blood pressure less than 130/85 per their documentation.   Seen in clinic 11/10/19. At that time her Losartan was increased to 50mg  daily due to elevated blood pressure. She was also using PRN Clonidine.    She had clinic visit with her primary care provided 12/01/19 and it was noted that her blood pressure was elevated and different in both arms: right 138/76 and left 140/80.    Reports taking her Losartan 75 mg and Bystolic 10mg  in the morning. Takes her Bystolic in the evening as well. Takes her Flexeril about Watts hour before bed. Continues to take Clonidine daily, as often as twice per day.   Today, she reports Watts episode of fainting in the shower. Happened in October 16th. Didn't feel dizzy or lightheaded prior to the fall. Just remembered she felt odd and fell. Remembers  waking up and housemate called the ambulance. Didn't go to the ER because she felt fine. NO chest pain or SOB prior to the fall. Had some floaties after waking up. Since then has not had fainting epsidoes. BP today is good, says it generally doesn't drop, if anything, it goes up.   Past Medical History:  Diagnosis Date   Anterolisthesis    Cervical spine   Asthma    Connective tissue disorder ([place])    recurrent carotid arteritis, temporal arteritis, vasculitis mandible, general hepatitis, avascular necrosis bilat   DDD (degenerative disc disease), lumbar    Diabetes mellitus without complication (HCC)    Diverticulosis    Duodenitis    Ganglion cyst of right foot    Gouty arthritis    Hiatal hernia with GERD    History of cardiovascular stress test    a. 07/2018 [facility]): Fixed inferoapical defect w/ nl contraction-->attenuation artifact. No ischemia. EF 63%.   Hyperlipidemia    Hypertension    a. 02/2019 Renal artery duplex: no evidence of prox RAS.   Hyperuricemia    Keratitis sicca, bilateral (HCC)    Lymphedema of both lower extremities    Osteoarthritis    Pericarditis    Recurrent: Aug 97, July 05, Sept 08, July 16, July 18   PUD (peptic ulcer disease)    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[place])    Sjogren's syndrome ([place])    Syncope    Tendonitis, Achilles, right    Tortuous colon    Trigger finger  Past Surgical History:  Procedure Laterality Date   ABDOMINAL HYSTERECTOMY     BREAST BIOPSY     BREAST EXCISIONAL BIOPSY Left 1986   neg   CHOLECYSTECTOMY     COLONOSCOPY WITH PROPOFOL N/A 08/26/2018   Procedure: COLONOSCOPY WITH PROPOFOL;  Surgeon: Lucilla Lame, MD;  Location: [facility] ENDOSCOPY;  Service: Endoscopy;  Laterality: N/A;   CYSTOSCOPY  02/26/2018   [name], MD    distal arthrectomy     ESOPHAGOGASTRODUODENOSCOPY (EGD) WITH PROPOFOL N/A 08/26/2018   Procedure: ESOPHAGOGASTRODUODENOSCOPY (EGD) WITH PROPOFOL;  Surgeon: Lucilla Lame, MD;  Location: [facility] ENDOSCOPY;   Service: Endoscopy;  Laterality: N/A;   EXCISION NEUROMA     KNEE SURGERY Bilateral    1985, 2001, 11/2002, 12/2006   panhysterectomy  10/1983   SHOULDER SURGERY Right    reverse total arthroplasty w/ biceps tenodesis   TONSILLECTOMY AND ADENOIDECTOMY     TOTAL HIP ARTHROPLASTY Right 04/2007   WISDOM TOOTH EXTRACTION      Current Medications: Current Meds  Medication Sig   acetaminophen (TYLENOL) 500 MG tablet Take 500 mg by mouth daily as needed.   ARTIFICIAL TEAR OP Apply to eye as needed.    BD INSULIN SYRINGE U/F 31G X 5/16" 0.5 ML MISC USE AS DIRECTED   BD INSULIN SYRINGE U/F 31G X 5/16" 1 ML MISC USE AS DIRECTED. WITH HUMALOG   BIOTIN PO Take 1 mg by mouth daily.    Cholecalciferol (VITAMIN D3) 25 MCG (1000 UT) CAPS Take 4 capsules by mouth daily.    clindamycin (CLEOCIN) 300 MG capsule Take 600 mg by mouth. 1 Hour before dental procedures   cloNIDine (CATAPRES - DOSED IN MG/24 HR) 0.1 mg/24hr patch Place 0.1 mg onto the skin once a week.   cloNIDine (CATAPRES) 0.1 MG tablet Take 1 tablet (0.1 mg) by mouth three times daily   COLCRYS 0.6 MG tablet Take 1 tablet (0.6 mg total) by mouth 2 (two) times daily as needed. For up to 3 days for pericarditis, or up to 7 days for gout or connective tissue disorder.   cyclobenzaprine (FLEXERIL) 5 MG tablet Take 1 tablet (5 mg total) by mouth daily as needed for muscle spasms.   ezetimibe (ZETIA) 10 MG tablet TAKE ONE TABLET BY MOUTH DAILY   famotidine (PEPCID) 40 MG tablet Take 40 mg by mouth 2 (two) times daily.   FREESTYLE LITE test strip USE AS DIRECTED THREE TIMES DAILY FOR DIABETES   insulin degludec (TRESIBA FLEXTOUCH) 100 UNIT/ML FlexTouch Pen Inject 64 Units into the skin daily.   insulin lispro (HUMALOG) 100 UNIT/ML injection Correction scale: take 1 unit per 50 over 150 before meals up to three times daily.  Up to 20 units per day.   Insulin Pen Needle (PEN NEEDLES) 32G X 5 MM MISC 1 Device by Does not apply route daily.   lactase  (LACTAID) 3000 units tablet Take by mouth as needed.    Lancets (FREESTYLE) lancets Must test x4/day   losartan (COZAAR) 50 MG tablet Take 1 tablet (50 mg total) by mouth 2 (two) times daily.   Nebivolol HCl 20 MG TABS Take 10 mg by mouth in the morning and at bedtime.   ondansetron (ZOFRAN-ODT) 4 MG disintegrating tablet Take 1 tablet (4 mg total) by mouth every 8 (eight) hours as needed for nausea or vomiting.   rosuvastatin (CRESTOR) 10 MG tablet Take 1 tablet (10 mg total) by mouth daily.     Allergies:   Amlodipine, Aspirin, Bee  venom, Ciprofloxacin, Codeine, Erythromycin, Glimepiride, Hydralazine, Hydrochlorothiazide, Hydrocodone, Hydrocodone-acetaminophen, Hydromorphone, Irbesartan, Irbesartan-hydrochlorothiazide, Lisinopril, Maxitrol [neomycin-polymyxin-dexameth], Metformin and related, Metformin hcl, Methylcellulose, Metoclopramide, Metoprolol, Neomycin-bacitracin zn-polymyx, Norvasc [amlodipine besylate], Nsaids, Omeprazole magnesium, Other, Oxycodone-acetaminophen, Penicillins, Pioglitazone, Poison sumac extract, Prilosec [omeprazole], Silver sulfadiazine, Sulfa antibiotics, Tape, Tetanus toxoid, Tramadol, and Betadine [povidone iodine]   Social History   Socioeconomic History   Marital status: Divorced    Spouse name: Not on file   Number of children: 0   Years of education: Not on file   Highest education level: Associate degree: academic program  Occupational History   Occupation: Retired  Tobacco Use   Smoking status: Former    Types: Cigarettes    Quit date: 01/22/1996    Years since quitting: 25.2   Smokeless tobacco: Never  Vaping Use   Vaping Use: Never used  Substance and Sexual Activity   Alcohol use: Yes    Comment: occasional-once every 6 months   Drug use: Never   Sexual activity: Not on file  Other Topics Concern   Not on file  Social History Narrative   Not on file   Social Determinants of Health   Financial Resource Strain: Not on file  Food  Insecurity: Not on file  Transportation Needs: Not on file  Physical Activity: Not on file  Stress: Not on file  Social Connections: Not on file     Family History: The patient's family history includes Cancer in her mother and sister; Congestive Heart Failure in her mother; Diabetes in her maternal grandmother and mother; Hyperlipidemia in her mother; Hypertension in her mother; Non-Hodgkin's lymphoma in her sister. There is no history of Breast cancer.  ROS:   Please see the history of present illness.     All other systems reviewed and are negative.  EKGs/Labs/Other Studies Reviewed:    The following studies were reviewed today:  US carotids 07/2020 Summary:  Right Carotid: Velocities in the right ICA are consistent with a 40-59%                 stenosis. The ECA appears >50% stenosed.   Left Carotid: Velocities in the left ICA are consistent with a 1-39%  stenosis.   Vertebrals:  Bilateral vertebral arteries demonstrate antegrade flow.  Subclavians: Normal flow hemodynamics were seen in bilateral subclavian               arteries.   *See table(s) above for measurements and observations.  Suggest follow up study in 12 months.   EKG:  EKG is  ordered today.  The ekg ordered today demonstrates NSR, HR 68bpm, nonspecific T wave changes  Recent Labs: 07/06/2020: ALT 16; BUN 15; Creatinine, Ser 0.87; Potassium 4.2; Sodium 137; TSH 0.841  Recent Lipid Panel    Component Value Date/Time   CHOL 256 (H) 07/06/2020 1207   TRIG 220 (H) 07/06/2020 1207   HDL 46 07/06/2020 1207   CHOLHDL 5.6 07/06/2020 1207   VLDL 44 (H) 07/06/2020 1207   LDLCALC 166 (H) 07/06/2020 1207   LDLCALC 100 (H) 02/10/2019 1053   LDLDIRECT 77.1 12/07/2019 1652    Physical Exam:    VS:  BP 140/70 (BP Location: Left Arm, Patient Position: Sitting, Cuff Size: Large)   Pulse 68   Ht 5\' 9"  (1.753 m)   Wt 232 lb (105.2 kg)   SpO2 98%   BMI 34.26 kg/m     Wt Readings from Last 3 Encounters:   04/18/21 232 lb (105.2 kg)  12/27/20 236 lb (  107 kg)  07/04/20 234 lb (106.1 kg)     GEN:  Well nourished, well developed in no acute distress HEENT: Normal NECK: No JVD; No carotid bruits LYMPHATICS: No lymphadenopathy CARDIAC: RRR, no murmurs, rubs, gallops RESPIRATORY:  Clear to auscultation without rales, wheezing or rhonchi  ABDOMEN: Soft, non-tender, non-distended MUSCULOSKELETAL:  No edema; No deformity  SKIN: Warm and dry NEUROLOGIC:  Alert and oriented x 3 PSYCHIATRIC:  Normal affect   ASSESSMENT:    1. Syncope and collapse   2. Hyperlipidemia, mixed   3. Essential hypertension   4. Carotid stenosis, asymptomatic, bilateral   5. Aortic atherosclerosis (HCC)    PLAN:    In order of problems listed above:  Syncope Syncopal event October 16th. EMS was called but she did not go to the ER since she felt fine. Says it was different than vasovagal episodes. Prior to the episode said she had no symptoms, happened all of a sudden. Remembers waking up and had floaties in her vision. No further syncopal episodes since that time. No chest pain or sob. She has chronic lymphedema. BP today good. Orthostatics negative. Plan for 2 week heart monitor. Will re-check bilateral US carotids. Check labs today, CBC, TSH, BMET, Mag. Will see back after heart monitor.   Aortic atherosclerosis Carotid artery disease US carotids 2022 showed 40-59% on the right and mild on the left. Will repeat as above. Continue Zetia and rosuvastatin.   HTN BP mildly elevated, she says it's very labile at home. Continue current regimen.   Lymphedema This is stable.    Disposition: Follow up in 3 week(s) with MD     Signed, Glady Ouderkirk Ninfa Meeker, PA-C  04/18/2021 4:17 PM    [facility]

## 2021-04-18 NOTE — Patient Instructions (Addendum)
Medication Instructions:  - Your physician recommends that you continue on your current medications as directed. Please refer to the Current Medication list given to you today.  *If you need a refill on your cardiac medications before your next appointment, please call your pharmacy*   Lab Work: - Your physician recommends that you have lab work today: BMP/ Magnesium/ TSH/ [facility] at [facility] 1st desk on the right to check in Lab hours: Monday- Friday (7:30 am- 5:30 pm)   If you have labs (blood work) drawn today and your tests are completely normal, you will receive your results only by: MyChart Message (if you have MyChart) OR A paper copy in the mail If you have any lab test that is abnormal or we need to change your treatment, we will call you to review the results.   Testing/Procedures:  1) Carotid ultrasound: - Your physician has requested that you have a carotid duplex. This test is an ultrasound of the carotid arteries in your neck. It looks at blood flow through these arteries that supply the brain with blood. Allow one hour for this exam. There are no restrictions or special instructions.    2) Heart Monitor:  Type: ZIO AT Placed: in office today Dates of wear: 04/18/21- 05/02/21  Your physician has recommended that you wear a Zio (heart) monitor.   This monitor is a medical device that records the heart's electrical activity. Doctors most often use these monitors to diagnose arrhythmias. Arrhythmias are problems with the speed or rhythm of the heartbeat. The monitor is a small device applied to your chest. You can wear one while you do your normal daily activities. While wearing this monitor if you have any symptoms to push the button and record what you felt. Once you have worn this monitor for the period of time provider prescribed (Usually 14 days), you will return the monitor device in the postage paid box. Once it is returned they will download the  data collected and provide [place] with a report which the provider will then review and we will call you with those results. Important tips:  Avoid showering during the first 24 hours of wearing the monitor. Avoid excessive sweating to help maximize wear time. Do not submerge the device, no hot tubs, and no swimming pools. Keep any lotions or oils away from the patch. After 24 hours you may shower with the patch on. Take brief showers with your back facing the shower head.  Do not remove patch once it has been placed because that will interrupt data and decrease adhesive wear time. Push the button when you have any symptoms and write down what you were feeling. Once you have completed wearing your monitor, remove and place into box which has postage paid and place in your outgoing mailbox.  If for some reason you have misplaced your box then call our office and we can provide another box and/or mail it off for you.     Follow-Up: At [facility], you and your health needs are our priority.  As part of our continuing mission to provide you with exceptional heart care, we have created designated Provider Care Teams.  These Care Teams include your primary Cardiologist (physician) and Advanced Practice Providers (APPs -  Physician Assistants and Nurse Practitioners) who all work together to provide you with the care you need, when you need it.  We recommend signing up for the patient portal called "MyChart".  Sign up information is  provided on this After Visit Summary.  MyChart is used to connect with patients for Virtual Visits (Telemedicine).  Patients are able to view lab/test results, encounter notes, upcoming appointments, etc.  Non-urgent messages can be sent to your provider as well.   To learn more about what you can do with MyChart, go to [email or web address].    Your next appointment:   As scheduled   The format for your next appointment:   In Person  Provider:   Ida Rogue, MD     Other Instructions - n/a

## 2021-04-21 ENCOUNTER — Telehealth: Payer: Self-pay | Admitting: Medical

## 2021-04-21 NOTE — Telephone Encounter (Signed)
Lamar Laundry, RN  04/20/2021  3:32 PM EST     Results released to mychart.

## 2021-04-21 NOTE — Telephone Encounter (Signed)
Cadence Ninfa Meeker, PA-C  04/20/2021  3:15 PM EST     Labs overall unremarkable. WBC mildly elevated

## 2021-04-21 NOTE — Telephone Encounter (Signed)
Results not yet reviewed by the patient on MyChart. Attempted to call the patient. No answer- no voice mail.

## 2021-04-24 NOTE — Telephone Encounter (Signed)
Results letter mailed to the patient. 

## 2021-04-27 NOTE — Telephone Encounter (Signed)
Called patient and went over her lab results from 04/21/21 . Patient was grateful for the call back.

## 2021-04-27 NOTE — Telephone Encounter (Signed)
Patient calling in regards lab results Has a PCP appt tomorrow and would like to go over that

## 2021-05-01 ENCOUNTER — Other Ambulatory Visit: Payer: Self-pay | Admitting: Internal Medicine

## 2021-05-01 DIAGNOSIS — R55 Syncope and collapse: Secondary | ICD-10-CM

## 2021-05-01 DIAGNOSIS — I10 Essential (primary) hypertension: Secondary | ICD-10-CM

## 2021-05-02 ENCOUNTER — Other Ambulatory Visit: Payer: Self-pay | Admitting: Cardiovascular Disease

## 2021-05-08 ENCOUNTER — Other Ambulatory Visit: Payer: Self-pay

## 2021-05-08 ENCOUNTER — Ambulatory Visit (INDEPENDENT_AMBULATORY_CARE_PROVIDER_SITE_OTHER): Payer: Medicare Other | Admitting: Cardiovascular Disease

## 2021-05-08 ENCOUNTER — Encounter: Payer: Self-pay | Admitting: Cardiovascular Disease

## 2021-05-08 VITALS — BP 162/90 | HR 74 | Ht 69.0 in | Wt 231.4 lb

## 2021-05-08 DIAGNOSIS — I890 Lymphedema, not elsewhere classified: Secondary | ICD-10-CM

## 2021-05-08 DIAGNOSIS — E782 Mixed hyperlipidemia: Secondary | ICD-10-CM

## 2021-05-08 DIAGNOSIS — R55 Syncope and collapse: Secondary | ICD-10-CM

## 2021-05-08 DIAGNOSIS — I6523 Occlusion and stenosis of bilateral carotid arteries: Secondary | ICD-10-CM

## 2021-05-08 DIAGNOSIS — I739 Peripheral vascular disease, unspecified: Secondary | ICD-10-CM

## 2021-05-08 DIAGNOSIS — I10 Essential (primary) hypertension: Secondary | ICD-10-CM

## 2021-05-08 DIAGNOSIS — I700 Atherosclerosis of aorta: Secondary | ICD-10-CM

## 2021-05-08 NOTE — Progress Notes (Signed)
Cardiology Office Note  Date:  05/08/2021   ID:  [name] 1945/12/01, MRN [id number]  PCP:  Idelle Crouch, MD   Chief Complaint  Patient presents with   Follow up Zio monitor     Medications reviewed by the patient verbally.     HPI:  [name] is a 75 year old woman with past medical history of Former smoker  PAD/carotid HTN urinary incontinence, chronic UTIs Morbid obesity Moderate to large hiatal hernia. bleeding ulcers x 3, Aortic atherosclerosis on CT scan syncope Presenting for  resistant hypertension, PAD, venous insuff  Oct 16th 2022, Sunday afternoon, BP 140s/80s Had shampoos hair,felt weird Woke up on shower floor EMT called [place] 126/80 per EMTs  Zio results pending Patient had a min HR of 47 bpm, max HR of 154 bpm, and avg HR of 64 bpm.  Sinus Rhythm.  7 Supraventricular Tachycardia runs occurred, the run with the fastest interval lasting 18 beats with a max rate of 154 bpm, the  longest lasting 17 beats with an avg rate of 107 bpm. Isolated SVEs were rare (<1.0%), SVE Couplets were rare (<1.0%), and SVE Triplets were rare (<1.0%). Isolated VEs were rare (<1.0%, 275), VE Triplets were rare (<1.0%, 2), and no VE Couplets were  present.   Now on clonidine patch Had side effects on clonidine pill, only takes clonidine pill prn  Carotid u/s Right Carotid: 40-59% stenosis.  Mild on left  Venous insuff noted Wraps daily Does lymph pumps daily  Can't do lasix, allergy  Labs reviewed Total chol 155, up to 255 WBC 12 HGBA1C 8.3,  EKG personally reviewed by myself on todays visit NSR rate 74 bpm  Seen in the hospital September 10, 2019 at [place] scan at [facility] Mild atherosclerosis in the thoracic aorta. At the lateral aspect of the  aortic arch, there is a small peripherally calcified focal outpouching,  most suggestive of a small saccular aneurysm, or less favored, a  penetrating aortic ulcer.    Intolerance of HCTZ,  sulfa Unable to tolerate calcium channel blocker secondary to leg swelling and lymphedema  Other past medical hx  long history of pericarditis, pericardial effusion Previously managed by [facility] being treated with colchicine in the past, Also previously treated with long course of steroids for unspecified connective tissue disorder  CT scan 06/2018 Mild to moderate diffuse descending aortic atherosclerosis extending into the common iliac arteries Coronary calcification in the right coronary artery  Carotid u/s 05/2018 Less than 50% stenosis in the right and left internal carotid Arteries.   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  PSH:    Past Surgical History:  Procedure Laterality Date   ABDOMINAL HYSTERECTOMY     BREAST BIOPSY     BREAST EXCISIONAL BIOPSY Left 1986   neg   CHOLECYSTECTOMY     COLONOSCOPY WITH PROPOFOL N/A 08/26/2018   Procedure: COLONOSCOPY WITH PROPOFOL;  Surgeon: Lucilla Lame, MD;  Location: [facility] ENDOSCOPY;  Service: Endoscopy;  Laterality: N/A;   CYSTOSCOPY  02/26/2018   [name], MD    distal arthrectomy  ESOPHAGOGASTRODUODENOSCOPY (EGD) WITH PROPOFOL N/A 08/26/2018   Procedure: ESOPHAGOGASTRODUODENOSCOPY (EGD) WITH PROPOFOL;  Surgeon: Lucilla Lame, MD;  Location: [facility] ENDOSCOPY;  Service: Endoscopy;  Laterality: N/A;   EXCISION NEUROMA     KNEE SURGERY Bilateral     1985, 2001, 11/2002, 12/2006   panhysterectomy  10/1983   SHOULDER SURGERY Right    reverse total arthroplasty w/ biceps tenodesis   TONSILLECTOMY AND ADENOIDECTOMY     TOTAL HIP ARTHROPLASTY Right 04/2007   WISDOM TOOTH EXTRACTION      Current Outpatient Medications  Medication Sig Dispense Refill   acetaminophen (TYLENOL) 500 MG tablet Take 500 mg by mouth daily as needed.     ARTIFICIAL TEAR OP Apply to eye as needed.      BD INSULIN SYRINGE U/F 31G X 5/16" 0.5 ML MISC USE AS DIRECTED 100 each 2   BD INSULIN SYRINGE U/F 31G X 5/16" 1 ML MISC USE AS DIRECTED. WITH HUMALOG 100 each 5   BIOTIN PO Take 1 mg by mouth daily.      Cholecalciferol (VITAMIN D3) 25 MCG (1000 UT) CAPS Take 4 capsules by mouth daily.      clindamycin (CLEOCIN) 300 MG capsule Take 600 mg by mouth. 1 Hour before dental procedures     cloNIDine (CATAPRES - DOSED IN MG/24 HR) 0.1 mg/24hr patch Place 0.1 mg onto the skin once a week.     cloNIDine (CATAPRES) 0.1 MG tablet Take 1 tablet (0.1 mg) by mouth three times daily 270 tablet 3   COLCRYS 0.6 MG tablet Take 1 tablet (0.6 mg total) by mouth 2 (two) times daily as needed. For up to 3 days for pericarditis, or up to 7 days for gout or connective tissue disorder. 60 tablet 2   cyclobenzaprine (FLEXERIL) 5 MG tablet Take 1 tablet (5 mg total) by mouth daily as needed for muscle spasms. 30 tablet 2   ezetimibe (ZETIA) 10 MG tablet TAKE 1 TABLET BY MOUTH  DAILY 90 tablet 0   famotidine (PEPCID) 40 MG tablet Take 40 mg by mouth 2 (two) times daily.     FREESTYLE LITE test strip USE AS DIRECTED THREE TIMES DAILY FOR DIABETES 100 strip 4   insulin degludec (TRESIBA FLEXTOUCH) 100 UNIT/ML FlexTouch Pen Inject 64 Units into the skin daily.     insulin lispro (HUMALOG) 100 UNIT/ML injection Correction scale: take 1 unit per 50 over 150 before meals up to three times daily.  Up to 20 units per day. 20 mL 1   Insulin Pen Needle (PEN NEEDLES) 32G X 5 MM MISC 1 Device by Does not  apply route daily. 100 each 3   lactase (LACTAID) 3000 units tablet Take by mouth as needed.      Lancets (FREESTYLE) lancets Must test x4/day     losartan (COZAAR) 50 MG tablet Take 1 tablet (50 mg total) by mouth 2 (two) times daily. 180 tablet 3   Nebivolol HCl 20 MG TABS Take 10 mg by mouth in the morning and at bedtime.     ondansetron (ZOFRAN-ODT) 4 MG disintegrating tablet Take 1 tablet (4 mg total) by mouth every 8 (eight) hours as needed for nausea or vomiting. 30 tablet 0   rosuvastatin (CRESTOR) 10 MG tablet Take 1 tablet (10 mg total) by mouth daily. 90 tablet 1   No current facility-administered medications for this visit.     Allergies:   Amlodipine, Aspirin, Bee venom, Ciprofloxacin, Codeine, Erythromycin, Glimepiride, Hydralazine, Hydrochlorothiazide, Hydrocodone, Hydrocodone-acetaminophen, Hydromorphone, Irbesartan, Irbesartan-hydrochlorothiazide,  Lisinopril, Maxitrol [neomycin-polymyxin-dexameth], Metformin and related, Metformin hcl, Methylcellulose, Metoclopramide, Metoprolol, Neomycin-bacitracin zn-polymyx, Norvasc [amlodipine besylate], Nsaids, Omeprazole magnesium, Other, Oxycodone-acetaminophen, Penicillins, Pioglitazone, Poison sumac extract, Prilosec [omeprazole], Silver sulfadiazine, Sulfa antibiotics, Tape, Tetanus toxoid, Tramadol, and Betadine [povidone iodine]   Social History:  The patient  reports that she quit smoking about 25 years ago. Her smoking use included cigarettes. She has never used smokeless tobacco. She reports current alcohol use. She reports that she does not use drugs.   Family History:   family history includes Cancer in her mother and sister; Congestive Heart Failure in her mother; Diabetes in her maternal grandmother and mother; Hyperlipidemia in her mother; Hypertension in her mother; Non-Hodgkin's lymphoma in her sister.    Review of Systems: Review of Systems  Constitutional: Negative.   HENT: Negative.    Respiratory: Negative.     Cardiovascular:  Positive for leg swelling.  Gastrointestinal: Negative.   Musculoskeletal: Negative.   Neurological:  Positive for loss of consciousness.  Psychiatric/Behavioral: Negative.    All other systems reviewed and are negative.  PHYSICAL EXAM: VS:  BP (!) 162/90 (BP Location: Left Arm, Patient Position: Sitting, Cuff Size: Normal)   Pulse 74   Ht 5\' 9"  (1.753 m)   Wt 231 lb 6 oz (105 kg)   SpO2 98%   BMI 34.17 kg/m  , BMI Body mass index is 34.17 kg/m.  Constitutional:  oriented to person, place, and time. No distress.  HENT:  Head: Grossly normal Eyes:  no discharge. No scleral icterus.  Neck: No JVD, no carotid bruits  Cardiovascular: Regular rate and rhythm, no murmurs appreciated Pulmonary/Chest: Clear to auscultation bilaterally, no wheezes or rails Abdominal: Soft.  no distension.  no tenderness.  Musculoskeletal: Normal range of motion Neurological:  normal muscle tone. Coordination normal. No atrophy Skin: Skin warm and dry Psychiatric: normal affect, pleasant   Recent Labs: 07/06/2020: ALT 16 04/18/2021: BUN 21; Creatinine, Ser 0.99; Hemoglobin 14.5; Magnesium 1.9; Platelets 281; Potassium 4.2; Sodium 137; TSH 0.766    Lipid Panel Lab Results  Component Value Date   CHOL 256 (H) 07/06/2020   HDL 46 07/06/2020   LDLCALC 166 (H) 07/06/2020   TRIG 220 (H) 07/06/2020    Wt Readings from Last 3 Encounters:  05/08/21 231 lb 6 oz (105 kg)  04/18/21 232 lb (105.2 kg)  12/27/20 236 lb (107 kg)      ASSESSMENT AND PLAN:  Syncope Etiology unclear, details discussed with her, EMTs were called, blood pressure stable but had quite some time to recover Head MRI scheduled Echo scheduled Carotid completed 07/2020 Etiology unclear, possible vagal epsiodes, labile HTN  Aortic atherosclerosis (HCC) On Crestor and Zetia  PAD (peripheral artery disease) (HCC) aortic atherosclerosis, carotid calcification Carotid u/s complete  Essential hypertension Long  list of medication intolerances Clonidine patch losartan 50 twice daily, continue bystolic 10 mg twice daily  Pure hypercholesterolemia Continue Crestor Zetia Stable  Lymphedema - Using her compression pumps for 1 hour daily Chronic lower extremity edema Followed by OT  Abdominal swelling Does not want diuretic, reports intolerance  Diabetes type 2 A1c elevated 8.1   Total encounter time more than 35 minutes  Greater than 50% was spent in counseling and coordination of care with the patient    Orders Placed This Encounter  Procedures   EKG 12-Lead     Signed, Esmond Plants, M.D., Ph.D. 05/08/2021  [place], [place] [phone]

## 2021-05-08 NOTE — Patient Instructions (Addendum)
Medication Instructions:  No changes  If you need a refill on your cardiac medications before your next appointment, please call your pharmacy.   Lab work: No new labs needed  Testing/Procedures: Your physician has requested that you have an echocardiogram. Echocardiography is a painless test that uses sound waves to create images of your heart. It provides your doctor with information about the size and shape of your heart and how well your heart's chambers and valves are working. This procedure takes approximately one hour. There are no restrictions for this procedure.  There is a possibility that an IV may need to be started during your test to inject an image enhancing agent. This is done to obtain more optimal pictures of your heart. Therefore we ask that you do at least drink some water prior to coming in to hydrate your veins.     Follow-Up: At [facility], you and your health needs are our priority.  As part of our continuing mission to provide you with exceptional heart care, we have created designated Provider Care Teams.  These Care Teams include your primary Cardiologist (physician) and Advanced Practice Providers (APPs -  Physician Assistants and Nurse Practitioners) who all work together to provide you with the care you need, when you need it.  You will need a follow up appointment in 12 months  Providers on your designated Care Team:   Murray Hodgkins, NP Christell Faith, PA-C Cadence Kathlen Mody, [place]  COVID-19 Vaccine Information can be found at: ShippingScam.co.uk For questions related to vaccine distribution or appointments, please email vaccine@[email or web address] or call [phone].

## 2021-05-15 ENCOUNTER — Ambulatory Visit
Admission: RE | Admit: 2021-05-15 | Discharge: 2021-05-15 | Disposition: A | Discharge status: Home / Self Care | Payer: Medicare Other | Source: Ambulatory Visit | Attending: Internal Medicine | Admitting: Internal Medicine

## 2021-05-15 ENCOUNTER — Other Ambulatory Visit: Payer: Self-pay

## 2021-05-15 DIAGNOSIS — I10 Essential (primary) hypertension: Secondary | ICD-10-CM | POA: Diagnosis present

## 2021-05-15 DIAGNOSIS — R55 Syncope and collapse: Secondary | ICD-10-CM | POA: Diagnosis present

## 2021-05-18 ENCOUNTER — Telehealth: Payer: Self-pay | Admitting: Cardiovascular Disease

## 2021-05-18 NOTE — Telephone Encounter (Signed)
Patient calling to report she had a neuro visit and mri was normal.

## 2021-05-19 NOTE — Telephone Encounter (Signed)
Noted  

## 2021-05-23 ENCOUNTER — Telehealth: Payer: Self-pay | Admitting: Cardiovascular Disease

## 2021-05-23 NOTE — Telephone Encounter (Signed)
Patient dropped off PAF put in box °

## 2021-05-24 MED ORDER — NEBIVOLOL HCL 10 MG PO TABS: 10.0000 mg | ORAL_TABLET | Freq: Two times a day (BID) | ORAL | 3 refills | Status: DC

## 2021-05-24 NOTE — Telephone Encounter (Signed)
Received PA application Completed provider's portion, attached copy of insurance card and med list Dr. Rockey Situ has signed Form faxed and placed in file cabinet  Bystolic 10 mg BID

## 2021-05-25 NOTE — Telephone Encounter (Signed)
Called patient back. She stated that she had a question for Dr. Donivan Scull nurse about the PAF that was faxed. I informed her of the notes form 05/24/21 as charted below. Patient stated that she just received her soc. sec. statement yesterday and was unsure if she would need to use that one or not. She stated that [facility] did not need to call her back but that she would be here for an echo at 2:00 and if [facility] was available to speak with her at that time she would greatly appreciate that. I will forward to nurse Mandie's inbasket.

## 2021-05-25 NOTE — Telephone Encounter (Signed)
Patient calling  Has question in regards to patient assistance  Please call

## 2021-05-26 ENCOUNTER — Other Ambulatory Visit: Payer: Self-pay

## 2021-05-26 ENCOUNTER — Ambulatory Visit (INDEPENDENT_AMBULATORY_CARE_PROVIDER_SITE_OTHER): Payer: Medicare Other

## 2021-05-26 DIAGNOSIS — R55 Syncope and collapse: Secondary | ICD-10-CM

## 2021-05-26 LAB — ECHOCARDIOGRAM COMPLETE
AR max vel: 2.19 cm2
AV Area VTI: 2.34 cm2
AV Area mean vel: 2.19 cm2
AV Mean grad: 7 mmHg
AV Peak grad: 12.8 mmHg
Ao pk vel: 1.79 m/s
Area-P 1/2: 2.62 cm2
Calc EF: 59.2 %
S' Lateral: 3 cm
Single Plane A2C EF: 55 %
Single Plane A4C EF: 65 %

## 2021-05-26 NOTE — Telephone Encounter (Signed)
Pt came in to office for ECHO She DI NOT have additional documents to add to already faxed application Pt only wanted a copy of her PA application for [employer] office made pt a copy for her records, pt reports she needed to call company to update information.  Application placed back in file cabinet.

## 2021-05-31 ENCOUNTER — Other Ambulatory Visit: Payer: Self-pay | Admitting: *Deleted

## 2021-05-31 DIAGNOSIS — E7800 Pure hypercholesterolemia, unspecified: Secondary | ICD-10-CM

## 2021-05-31 MED ORDER — ROSUVASTATIN CALCIUM 10 MG PO TABS: 10.0000 mg | ORAL_TABLET | Freq: Every day | ORAL | 1 refills | Status: DC

## 2021-06-02 ENCOUNTER — Other Ambulatory Visit: Payer: Self-pay

## 2021-06-02 ENCOUNTER — Ambulatory Visit
Admission: EM | Admit: 2021-06-02 | Discharge: 2021-06-02 | Disposition: A | Discharge status: Home / Self Care | Payer: Medicare Other | Attending: Physician Assistant | Admitting: Physician Assistant

## 2021-06-02 DIAGNOSIS — Z87891 Personal history of nicotine dependence: Secondary | ICD-10-CM | POA: Insufficient documentation

## 2021-06-02 DIAGNOSIS — E785 Hyperlipidemia, unspecified: Secondary | ICD-10-CM | POA: Diagnosis not present

## 2021-06-02 DIAGNOSIS — Z20822 Contact with and (suspected) exposure to covid-19: Secondary | ICD-10-CM | POA: Insufficient documentation

## 2021-06-02 DIAGNOSIS — M3500 Sicca syndrome, unspecified: Secondary | ICD-10-CM | POA: Insufficient documentation

## 2021-06-02 DIAGNOSIS — I129 Hypertensive chronic kidney disease with stage 1 through stage 4 chronic kidney disease, or unspecified chronic kidney disease: Secondary | ICD-10-CM | POA: Diagnosis not present

## 2021-06-02 DIAGNOSIS — R051 Acute cough: Secondary | ICD-10-CM | POA: Insufficient documentation

## 2021-06-02 DIAGNOSIS — J9801 Acute bronchospasm: Secondary | ICD-10-CM | POA: Diagnosis not present

## 2021-06-02 DIAGNOSIS — R0981 Nasal congestion: Secondary | ICD-10-CM | POA: Diagnosis not present

## 2021-06-02 DIAGNOSIS — N183 Chronic kidney disease, stage 3 unspecified: Secondary | ICD-10-CM | POA: Insufficient documentation

## 2021-06-02 MED ORDER — ALBUTEROL SULFATE HFA 108 (90 BASE) MCG/ACT IN AERS: 1.0000 | INHALATION_SPRAY | RESPIRATORY_TRACT | 0 refills | Status: DC | PRN

## 2021-06-02 MED ORDER — PREDNISONE 20 MG PO TABS: 40.0000 mg | ORAL_TABLET | Freq: Every day | ORAL | 0 refills | Status: AC

## 2021-06-02 NOTE — ED Provider Notes (Signed)
[facility]    CSN: [id number] Arrival date & time: 06/02/21  1411      History   Chief Complaint Chief Complaint  Patient presents with   Cough    HPI [name] is a 75 y.o. female presenting for 3-day history of cough and shortness of breath.  Patient says happened after she went to [employer] and son was cleaning an area with a unidentified cleaning product.  She says she inhaled it and symptoms started shortly after.  Patient has a remote history of asthma but says she has not needed to use inhaler in a very long time and she does not even have 1 to use.  Most the slight nasal congestion as well.  No fever, fatigue, achiness or sore throat.  No wheezing or chest pain.  Does not report pleuritic pain.  No sick contacts or known exposure to COVID or flu.  Patient has multiple medical conditions including Sjogren's syndrome, connective tissue disorder, hypertension, hyperlipidemia, recurrent pericarditis.  Patient reports she recently had an echo performed last week.  She has also recently had MRI of brain at the beginning of the month.  Review of medical record shows no significant abnormal findings with either study.  HPI  Past Medical History:  Diagnosis Date   Anterolisthesis    Cervical spine   Asthma    Connective tissue disorder ([place])    recurrent carotid arteritis, temporal arteritis, vasculitis mandible, general hepatitis, avascular necrosis bilat   DDD (degenerative disc disease), lumbar    Diabetes mellitus without complication (HCC)    Diverticulosis    Duodenitis    Ganglion cyst of right foot    Gouty arthritis    Hiatal hernia with GERD    History of cardiovascular stress test    a. 07/2018 [facility]): Fixed inferoapical defect w/ nl contraction-->attenuation artifact. No ischemia. EF 63%.   Hyperlipidemia    Hypertension    a. 02/2019 Renal artery duplex: no evidence of prox RAS.   Hyperuricemia    Keratitis sicca, bilateral (HCC)     Lymphedema of both lower extremities    Osteoarthritis    Pericarditis    Recurrent: Aug 97, July 05, Sept 08, July 16, July 18   PUD (peptic ulcer disease)    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[place])    Sjogren's syndrome ([place])    Syncope    Tendonitis, Achilles, right    Tortuous colon    Trigger finger     Patient Active Problem List   Diagnosis Date Noted   Nausea 03/31/2020   Multiple thyroid nodules 12/28/2019   Thyroid nodule 09/29/2019   Lymphedema 11/10/2018   PAD (peripheral artery disease) ([place]) 10/12/2018   Aortic atherosclerosis ([place]) 10/12/2018   Carotid stenosis, asymptomatic, bilateral 10/12/2018   Positive colorectal cancer screening using Cologuard test 10/09/2018   Gastroesophageal reflux disease    Acute gastritis without hemorrhage    Osteoarthritis 04/29/2018   Hiatal hernia with GERD 04/29/2018   Hyperlipidemia 04/29/2018   Lymphedema of both lower extremities 04/29/2018   Left knee pain 10/23/2016   Cataracts, bilateral 07/31/2016   Glaucoma 07/31/2016   Lateral epicondylitis of left elbow 10/20/2015   Vitamin D deficiency 10/20/2015   Type 2 diabetes, controlled, 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   Gouty arthritis 06/03/2015   H/O syncope 06/03/2015   Mild intermittent asthma 06/03/2015   Multiple gastric ulcers 06/03/2015   Schatzki's ring 06/03/2015   Undifferentiated connective tissue disease ([place]) 06/03/2015   Bone disorder  04/05/2015   Dental injury 04/05/2015   Pericarditis 04/05/2015   Sjogren's syndrome ([place]) 09/07/2014   Elevated alkaline phosphatase level 08/24/2014   Lactose intolerance 08/24/2014   Obesity 08/24/2014   Peripheral edema 08/24/2014   Benign hypertension with CKD (chronic kidney disease) stage III 08/03/2014   Abdominal adhesions 08/02/2014   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[place]) 08/02/2014   Degenerative joint disease (DJD) of lumbar spine 08/02/2014   Diverticulosis of both small and large intestine 08/02/2014    Hyperuricemia 08/02/2014   Pure hypercholesterolemia 08/02/2014   Trigger finger 08/02/2014   Right shoulder pain 07/29/2014   Benign neoplasm of colon 06/30/2010   Right upper quadrant pain 06/30/2010    Past Surgical History:  Procedure Laterality Date   ABDOMINAL HYSTERECTOMY     BREAST BIOPSY     BREAST EXCISIONAL BIOPSY Left 1986   neg   CHOLECYSTECTOMY     COLONOSCOPY WITH PROPOFOL N/A 08/26/2018   Procedure: COLONOSCOPY WITH PROPOFOL;  Surgeon: Lucilla Lame, MD;  Location: [facility] ENDOSCOPY;  Service: Endoscopy;  Laterality: N/A;   CYSTOSCOPY  02/26/2018   [name], MD    distal arthrectomy     ESOPHAGOGASTRODUODENOSCOPY (EGD) WITH PROPOFOL N/A 08/26/2018   Procedure: ESOPHAGOGASTRODUODENOSCOPY (EGD) WITH PROPOFOL;  Surgeon: Lucilla Lame, MD;  Location: [facility] ENDOSCOPY;  Service: Endoscopy;  Laterality: N/A;   EXCISION NEUROMA     KNEE SURGERY Bilateral    1985, 2001, 11/2002, 12/2006   panhysterectomy  10/1983   SHOULDER SURGERY Right    reverse total arthroplasty w/ biceps tenodesis   TONSILLECTOMY AND ADENOIDECTOMY     TOTAL HIP ARTHROPLASTY Right 04/2007   WISDOM TOOTH EXTRACTION      OB History   No obstetric history on file.      Home Medications    Prior to Admission medications   Medication Sig Start Date End Date Taking? Authorizing Provider  acetaminophen (TYLENOL) 500 MG tablet Take 500 mg by mouth daily as needed.   Yes [provider]  albuterol (VENTOLIN HFA) 108 (90 Base) MCG/ACT inhaler Inhale 1-2 puffs into the lungs every 4 (four) hours as needed for wheezing or shortness of breath. 06/02/21  Yes Danton Clap, PA-C  ARTIFICIAL TEAR OP Apply to eye as needed.    Yes [provider]  BD INSULIN SYRINGE U/F 31G X 5/16" 0.5 ML MISC USE AS DIRECTED 08/30/20  Yes Idelle Crouch, MD  BD INSULIN SYRINGE U/F 31G X 5/16" 1 ML MISC USE AS DIRECTED. WITH HUMALOG 07/28/18  Yes Mikey College, NP  BIOTIN PO Take 1 mg by mouth daily.     Yes [provider]  Cholecalciferol (VITAMIN D3) 25 MCG (1000 UT) CAPS Take 4 capsules by mouth daily.    Yes [provider]  cloNIDine (CATAPRES - DOSED IN MG/24 HR) 0.1 mg/24hr patch Place 0.1 mg onto the skin once a week.   Yes Idelle Crouch, MD  cloNIDine (CATAPRES) 0.1 MG tablet Take 1 tablet (0.1 mg total) by mouth 3 (three) times daily as needed. Take 1 tablet (0.1 mg) by mouth three times daily 05/08/21  Yes Gollan, Kathlene November, MD  COLCRYS 0.6 MG tablet Take 1 tablet (0.6 mg total) by mouth 2 (two) times daily as needed. For up to 3 days for pericarditis, or up to 7 days for gout or connective tissue disorder. 01/14/20  Yes Karamalegos, Devonne Doughty, DO  cyclobenzaprine (FLEXERIL) 5 MG tablet Take 1 tablet (5 mg total)  by mouth daily as needed for muscle spasms. 05/26/20  Yes Malfi, [name], FNP  ezetimibe (ZETIA) 10 MG tablet TAKE 1 TABLET BY MOUTH  DAILY 05/02/21  Yes Gollan, Kathlene November, MD  famotidine (PEPCID) 40 MG tablet Take 40 mg by mouth 2 (two) times daily.   Yes [provider]  FREESTYLE LITE test strip USE AS DIRECTED THREE TIMES DAILY FOR DIABETES 03/03/20  Yes Malfi, [name], FNP  insulin degludec (TRESIBA FLEXTOUCH) 100 UNIT/ML FlexTouch Pen Inject 64 Units into the skin daily.   Yes [provider]  insulin lispro (HUMALOG) 100 UNIT/ML injection Correction scale: take 1 unit per 50 over 150 before meals up to three times daily.  Up to 20 units per day. 02/12/19  Yes Mikey College, NP  Insulin Pen Needle (PEN NEEDLES) 32G X 5 MM MISC 1 Device by Does not apply route daily. 08/26/19  Yes Malfi, [name], FNP  lactase (LACTAID) 3000 units tablet Take by mouth as needed.    Yes [provider]  Lancets (FREESTYLE) lancets Must test x4/day 06/19/16  Yes [provider]  losartan (COZAAR) 50 MG tablet Take 1 tablet (50 mg total) by mouth 2 (two) times daily. 11/14/20  Yes Gollan, Kathlene November, MD  nebivolol (BYSTOLIC) 10 MG  tablet Take 1 tablet (10 mg total) by mouth 2 (two) times daily. 05/24/21  Yes Gollan, Kathlene November, MD  ondansetron (ZOFRAN-ODT) 4 MG disintegrating tablet Take 1 tablet (4 mg total) by mouth every 8 (eight) hours as needed for nausea or vomiting. 03/29/20  Yes Malfi, [name], FNP  predniSONE (DELTASONE) 20 MG tablet Take 2 tablets (40 mg total) by mouth daily for 5 days. 06/02/21 06/07/21 Yes Laurene Footman B, PA-C  rosuvastatin (CRESTOR) 10 MG tablet Take 1 tablet (10 mg total) by mouth daily. 05/31/21  Yes Gollan, Kathlene November, MD  clindamycin (CLEOCIN) 300 MG capsule Take 600 mg by mouth. 1 Hour before dental procedures    [provider]    Family History Family History  Problem Relation Age of Onset   Hypertension Mother    Diabetes Mother    Hyperlipidemia Mother    Congestive Heart Failure Mother    Cancer Mother    Non-Hodgkin's lymphoma Sister    Cancer Sister    Diabetes Maternal Grandmother    Breast cancer Neg Hx     Social History Social History   Tobacco Use   Smoking status: Former    Types: Cigarettes    Quit date: 01/22/1996    Years since quitting: 25.3   Smokeless tobacco: Never  Vaping Use   Vaping Use: Never used  Substance Use Topics   Alcohol use: Yes    Comment: occasional-once every 6 months   Drug use: Never     Allergies   Amlodipine, Aspirin, Bee venom, Ciprofloxacin, Codeine, Erythromycin, Glimepiride, Hydralazine, Hydrochlorothiazide, Hydrocodone, Hydrocodone-acetaminophen, Hydromorphone, Irbesartan, Irbesartan-hydrochlorothiazide, Lisinopril, Maxitrol [neomycin-polymyxin-dexameth], Metformin and related, Metformin hcl, Methylcellulose, Metoclopramide, Metoprolol, Neomycin-bacitracin zn-polymyx, Norvasc [amlodipine besylate], Nsaids, Omeprazole magnesium, Other, Oxycodone-acetaminophen, Penicillins, Pioglitazone, Poison sumac extract, Prilosec [omeprazole], Silver sulfadiazine, Sulfa antibiotics, Tape, Tetanus toxoid, Tramadol, and Betadine  [povidone iodine]   Review of Systems Review of Systems  Constitutional:  Negative for chills, diaphoresis, fatigue and fever.  HENT:  Positive for congestion. Negative for ear pain, rhinorrhea, sinus pressure, sinus pain and sore throat.   Respiratory:  Positive for cough and shortness of breath.   Gastrointestinal:  Negative for abdominal pain, nausea and vomiting.  Musculoskeletal:  Negative for arthralgias and myalgias.  Skin:  Negative for rash.  Neurological:  Negative for weakness and headaches.  Hematological:  Negative for adenopathy.    Physical Exam Triage Vital Signs ED Triage Vitals  Enc Vitals Group     BP      Pulse      Resp      Temp      Temp src      SpO2      Weight      Height      Head Circumference      Peak Flow      Pain Score      Pain Loc      Pain Edu?      Excl. in [place]?    No data found.  Updated Vital Signs BP 115/62 (BP Location: Left Arm)    Pulse 73    Temp 98.9 F (37.2 C) (Oral)    Resp 18    Ht 5\' 9"  (1.753 m)    Wt 231 lb (104.8 kg)    SpO2 100%    BMI 34.11 kg/m       Physical Exam Vitals and nursing note reviewed.  Constitutional:      General: She is not in acute distress.    Appearance: Normal appearance. She is not ill-appearing or toxic-appearing.     Comments: Coughs a few times when taking a deep breath.  HENT:     Head: Normocephalic and atraumatic.     Nose: Nose normal.     Mouth/Throat:     Mouth: Mucous membranes are moist.     Pharynx: Oropharynx is clear.  Eyes:     General: No scleral icterus.       Right eye: No discharge.        Left eye: No discharge.     Conjunctiva/sclera: Conjunctivae normal.  Cardiovascular:     Rate and Rhythm: Normal rate and regular rhythm.     Heart sounds: Normal heart sounds.  Pulmonary:     Effort: Pulmonary effort is normal. No respiratory distress.     Breath sounds: Normal breath sounds. No wheezing, rhonchi or rales.  Musculoskeletal:     Cervical back: Neck  supple.  Skin:    General: Skin is dry.  Neurological:     General: No focal deficit present.     Mental Status: She is alert. Mental status is at baseline.     Motor: No weakness.     Gait: Gait normal.  Psychiatric:        Mood and Affect: Mood normal.        Behavior: Behavior normal.        Thought Content: Thought content normal.     UC Treatments / Results  Labs (all labs ordered are listed, but only abnormal results are displayed) Labs Reviewed  SARS CORONAVIRUS 2 (TAT 6-24 HRS)    EKG   Radiology No results found.  Procedures Procedures (including critical care time)  Medications Ordered in UC Medications - No data to display  Initial Impression / Assessment and Plan / UC Course  I have reviewed the triage vital signs and the nursing notes.  Pertinent labs & imaging results that were available during my care of the patient were reviewed by me and considered in my medical decision making (see chart for details).  75 year old female presenting for 3-day history of cough and bronchospasm.  Also mild nasal congestion.  No associated  fevers.  States she feels short of breath but denies wheezing, chest pain or pleuritic pain.  Vitals all normal and stable and she is overall well-appearing.  Able to speak in full sentences without any evidence of respiratory distress.  She does occasionally take a deep breath when she speaks.  Occasionally coughs when taking a deep breath as well.  Chest is clear to auscultation heart regular rate and rhythm.  COVID test obtained.  Current CDC guidance, isolation protocol and ED precautions reviewed if COVID-positive.  If COVID-positive is candidate for antiviral medication.  Would prefer Molnupiravir.   Suspect patient is having bronchospasm related to the chemical agents she inhaled and the cleaning supplies.  Patient prescribed ProAir inhaler.  Printed prescription for prednisone in case the inhaler alone is not helping the next  couple days.  Reviewed over-the-counter cough medicines.  Advised to increase rest and fluids.  Reviewed ED precautions.  Final Clinical Impressions(s) / UC Diagnoses   Final diagnoses:  Acute bronchospasm  Acute cough     Discharge Instructions      -You are having a bronchospasm.  Its most likely related to the inhalation of the chemicals.  An inhaler alone should be helpful.  Also consider over-the-counter cough medication and increase rest and fluids. - If no improvement in next couple of days or symptoms were to worsen you should fill and take the prednisone but no it can raise your blood sugars so you will have to adjust your insulin accordingly.  If you find it difficult to manage your blood sugar, you may need to discontinue the prednisone. - COVID test will be back tomorrow.  If you are COVID-positive you have to isolate 5 days and wear mask 5 days.  Would be candidate for antiviral medicine and we will contact you if you are positive.     ED Prescriptions     Medication Sig Dispense Auth. Provider   albuterol (VENTOLIN HFA) 108 (90 Base) MCG/ACT inhaler Inhale 1-2 puffs into the lungs every 4 (four) hours as needed for wheezing or shortness of breath. 1 g Laurene Footman B, PA-C   predniSONE (DELTASONE) 20 MG tablet Take 2 tablets (40 mg total) by mouth daily for 5 days. 10 tablet Danton Clap, PA-C      I have reviewed the PDMP during this encounter.   Danton Clap, PA-C 06/02/21 1523

## 2021-06-02 NOTE — Discharge Instructions (Signed)
-  You are having a bronchospasm.  Its most likely related to the inhalation of the chemicals.  An inhaler alone should be helpful.  Also consider over-the-counter cough medication and increase rest and fluids. - If no improvement in next couple of days or symptoms were to worsen you should fill and take the prednisone but no it can raise your blood sugars so you will have to adjust your insulin accordingly.  If you find it difficult to manage your blood sugar, you may need to discontinue the prednisone. - COVID test will be back tomorrow.  If you are COVID-positive you have to isolate 5 days and wear mask 5 days.  Would be candidate for antiviral medicine and we will contact you if you are positive.

## 2021-06-02 NOTE — ED Triage Notes (Signed)
Pt c/o cough, SOB. Pt went to [employer] and states that she inhaled the cleaning product used at night on tuesday. Pt states that she has asthma.

## 2021-06-03 ENCOUNTER — Telehealth: Payer: Self-pay | Admitting: Physician Assistant

## 2021-06-03 LAB — SARS CORONAVIRUS 2 (TAT 6-24 HRS): SARS Coronavirus 2: NEGATIVE

## 2021-06-03 MED ORDER — ALBUTEROL SULFATE HFA 108 (90 BASE) MCG/ACT IN AERS: 1.0000 | INHALATION_SPRAY | RESPIRATORY_TRACT | 0 refills | Status: AC | PRN

## 2021-06-03 NOTE — Telephone Encounter (Signed)
Received a call stating that the medication was not received by the pharmacy. Called [facility] in [place] and they have stated that they have received no prescriptions for [facility]. Called and spoke with Baker Janus and she states that she has not received her Albuterol as of now. Pt stated that she has went to her neighbors and has used their albuterol inhaler yesterday. Anniemae confirmed that she wanted her medication sent to the [facility] in [place]. Medication has been resent.

## 2021-06-06 ENCOUNTER — Encounter (INDEPENDENT_AMBULATORY_CARE_PROVIDER_SITE_OTHER): Payer: Medicare Other

## 2021-06-06 ENCOUNTER — Ambulatory Visit (INDEPENDENT_AMBULATORY_CARE_PROVIDER_SITE_OTHER): Payer: PRIVATE HEALTH INSURANCE | Admitting: Vascular Surgery

## 2021-06-06 ENCOUNTER — Telehealth: Payer: Self-pay

## 2021-06-06 NOTE — Telephone Encounter (Signed)
Left detail message on VM of pt's recent results okay by DPR, Dr. Rockey Situ advised   "Echocardiogram  Normal LV function and RV function  Normal pressures, no significant valve disease "  At this time, no further recommendations or medications changes, advised to call office for any concerns or questions, otherwise will see at next visit.

## 2021-06-09 ENCOUNTER — Ambulatory Visit (INDEPENDENT_AMBULATORY_CARE_PROVIDER_SITE_OTHER): Payer: Medicare Other

## 2021-06-09 ENCOUNTER — Ambulatory Visit
Admission: EM | Admit: 2021-06-09 | Discharge: 2021-06-09 | Disposition: A | Discharge status: Home / Self Care | Payer: Medicare Other | Attending: Internal Medicine | Admitting: Internal Medicine

## 2021-06-09 ENCOUNTER — Other Ambulatory Visit: Payer: Self-pay

## 2021-06-09 DIAGNOSIS — J40 Bronchitis, not specified as acute or chronic: Secondary | ICD-10-CM | POA: Diagnosis not present

## 2021-06-09 DIAGNOSIS — J209 Acute bronchitis, unspecified: Secondary | ICD-10-CM

## 2021-06-09 DIAGNOSIS — R059 Cough, unspecified: Secondary | ICD-10-CM | POA: Diagnosis not present

## 2021-06-09 MED ORDER — DOXYCYCLINE HYCLATE 100 MG PO CAPS: 100.0000 mg | ORAL_CAPSULE | Freq: Two times a day (BID) | ORAL | 0 refills | Status: DC

## 2021-06-09 MED ORDER — IPRATROPIUM-ALBUTEROL 0.5-2.5 (3) MG/3ML IN SOLN
3.0000 mL | Freq: Once | RESPIRATORY_TRACT | Status: AC
  Administered 2021-06-09: 19:00:00 3 mL via RESPIRATORY_TRACT

## 2021-06-09 MED ORDER — BUDESONIDE-FORMOTEROL FUMARATE 80-4.5 MCG/ACT IN AERO: 2.0000 | INHALATION_SPRAY | Freq: Two times a day (BID) | RESPIRATORY_TRACT | 0 refills | Status: AC

## 2021-06-09 NOTE — ED Provider Notes (Signed)
[facility]    CSN: [id number] Arrival date & time: 06/09/21  3785      History   Chief Complaint Chief Complaint  Patient presents with   Cough    HPI [name] is a 75 y.o. female who presents with unresolved cough x 7 weeks since she was exposed to some cleaner at [employer]. She was placed on Albuterol inhaler and Prednisone when seen here 06/02/21 Has had low grade temp.     Past Medical History:  Diagnosis Date   Anterolisthesis    Cervical spine   Asthma    Connective tissue disorder ([place])    recurrent carotid arteritis, temporal arteritis, vasculitis mandible, general hepatitis, avascular necrosis bilat   DDD (degenerative disc disease), lumbar    Diabetes mellitus without complication (HCC)    Diverticulosis    Duodenitis    Ganglion cyst of right foot    Gouty arthritis    Hiatal hernia with GERD    History of cardiovascular stress test    a. 07/2018 [facility]): Fixed inferoapical defect w/ nl contraction-->attenuation artifact. No ischemia. EF 63%.   Hyperlipidemia    Hypertension    a. 02/2019 Renal artery duplex: no evidence of prox RAS.   Hyperuricemia    Keratitis sicca, bilateral (HCC)    Lymphedema of both lower extremities    Osteoarthritis    Pericarditis    Recurrent: Aug 97, July 05, Sept 08, July 16, July 18   PUD (peptic ulcer disease)    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[place])    Sjogren's syndrome ([place])    Syncope    Tendonitis, Achilles, right    Tortuous colon    Trigger finger     Patient Active Problem List   Diagnosis Date Noted   Nausea 03/31/2020   Multiple thyroid nodules 12/28/2019   Thyroid nodule 09/29/2019   Lymphedema 11/10/2018   PAD (peripheral artery disease) ([place]) 10/12/2018   Aortic atherosclerosis ([place]) 10/12/2018   Carotid stenosis, asymptomatic, bilateral 10/12/2018   Positive colorectal cancer screening using Cologuard test 10/09/2018   Gastroesophageal reflux disease    Acute gastritis without  hemorrhage    Osteoarthritis 04/29/2018   Hiatal hernia with GERD 04/29/2018   Hyperlipidemia 04/29/2018   Lymphedema of both lower extremities 04/29/2018   Left knee pain 10/23/2016   Cataracts, bilateral 07/31/2016   Glaucoma 07/31/2016   Lateral epicondylitis of left elbow 10/20/2015   Vitamin D deficiency 10/20/2015   Type 2 diabetes, controlled, with neuropathy ([place]) 10/16/2015   Gouty arthritis 06/03/2015   H/O syncope 06/03/2015   Mild intermittent asthma 06/03/2015   Multiple gastric ulcers 06/03/2015   Schatzki's ring 06/03/2015   Undifferentiated connective tissue disease ([place]) 06/03/2015   Bone disorder 04/05/2015   Dental injury 04/05/2015   Pericarditis 04/05/2015   Sjogren's syndrome ([place]) 09/07/2014   Elevated alkaline phosphatase level 08/24/2014   Lactose intolerance 08/24/2014   Obesity 08/24/2014   Peripheral edema 08/24/2014   Benign hypertension with CKD (chronic kidney disease) stage III 08/03/2014   Abdominal adhesions 08/02/2014   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[place]) 08/02/2014   Degenerative joint disease (DJD) of lumbar spine 08/02/2014   Diverticulosis of both small and large intestine 08/02/2014   Hyperuricemia 08/02/2014   Pure hypercholesterolemia 08/02/2014   Trigger finger 08/02/2014   Right shoulder pain 07/29/2014   Benign neoplasm of colon 06/30/2010   Right upper quadrant pain 06/30/2010    Past Surgical History:  Procedure Laterality Date  ABDOMINAL HYSTERECTOMY     BREAST BIOPSY     BREAST EXCISIONAL BIOPSY Left 1986   neg   CHOLECYSTECTOMY     COLONOSCOPY WITH PROPOFOL N/A 08/26/2018   Procedure: COLONOSCOPY WITH PROPOFOL;  Surgeon: Lucilla Lame, MD;  Location: [facility] ENDOSCOPY;  Service: Endoscopy;  Laterality: N/A;   CYSTOSCOPY  02/26/2018   [name], MD    distal arthrectomy     ESOPHAGOGASTRODUODENOSCOPY (EGD) WITH PROPOFOL N/A 08/26/2018   Procedure: ESOPHAGOGASTRODUODENOSCOPY (EGD) WITH PROPOFOL;  Surgeon: Lucilla Lame, MD;  Location: [facility] ENDOSCOPY;  Service: Endoscopy;  Laterality: N/A;   EXCISION NEUROMA     KNEE SURGERY Bilateral    1985, 2001, 11/2002, 12/2006   panhysterectomy  10/1983   SHOULDER SURGERY Right    reverse total arthroplasty w/ biceps tenodesis   TONSILLECTOMY AND ADENOIDECTOMY     TOTAL HIP ARTHROPLASTY Right 04/2007   WISDOM TOOTH EXTRACTION      OB History   No obstetric history on file.      Home Medications    Prior to Admission medications   Medication Sig Start Date End Date Taking? Authorizing Provider  budesonide-formoterol (SYMBICORT) 80-4.5 MCG/ACT inhaler Inhale 2 puffs into the lungs 2 (two) times daily. 06/09/21  Yes Rodriguez-Southworth, Sunday Spillers, PA-C  doxycycline (VIBRAMYCIN) 100 MG capsule Take 1 capsule (100 mg total) by mouth 2 (two) times daily. 06/09/21  Yes Rodriguez-Southworth, Sunday Spillers, PA-C  acetaminophen (TYLENOL) 500 MG tablet Take 500 mg by mouth daily as needed.    [provider]  albuterol (VENTOLIN HFA) 108 (90 Base) MCG/ACT inhaler Inhale 1-2 puffs into the lungs every 4 (four) hours as needed for wheezing or shortness of breath. 06/03/21   Danton Clap, PA-C  ARTIFICIAL TEAR OP Apply to eye as needed.     [provider]  BD INSULIN SYRINGE U/F 31G X 5/16" 0.5 ML MISC USE AS DIRECTED 08/30/20   Idelle Crouch, MD  BD INSULIN SYRINGE U/F 31G X 5/16" 1 ML MISC USE AS DIRECTED. WITH HUMALOG 07/28/18   Mikey College, NP  BIOTIN PO Take 1 mg by mouth daily.     [provider]  Cholecalciferol (VITAMIN D3) 25 MCG (1000 UT) CAPS Take 4 capsules by mouth daily.     [provider]  clindamycin (CLEOCIN) 300 MG capsule Take 600 mg by mouth. 1 Hour before dental procedures    [provider]  cloNIDine (CATAPRES - DOSED IN MG/24 HR) 0.1 mg/24hr patch Place 0.1 mg onto the skin once a week.    Idelle Crouch, MD  cloNIDine (CATAPRES) 0.1 MG tablet Take 1 tablet (0.1 mg total) by mouth 3  (three) times daily as needed. Take 1 tablet (0.1 mg) by mouth three times daily 05/08/21   Minna Merritts, MD  COLCRYS 0.6 MG tablet Take 1 tablet (0.6 mg total) by mouth 2 (two) times daily as needed. For up to 3 days for pericarditis, or up to 7 days for gout or connective tissue disorder. 01/14/20   Karamalegos, Devonne Doughty, DO  cyclobenzaprine (FLEXERIL) 5 MG tablet Take 1 tablet (5 mg total) by mouth daily as needed for muscle spasms. 05/26/20   Malfi, [name], FNP  ezetimibe (ZETIA) 10 MG tablet TAKE 1 TABLET BY MOUTH  DAILY 05/02/21   Minna Merritts, MD  famotidine (PEPCID) 40 MG tablet Take 40 mg by mouth 2 (two) times daily.    [provider]  FREESTYLE LITE test strip USE AS  DIRECTED THREE TIMES DAILY FOR DIABETES 03/03/20   Verl Bangs, FNP  insulin degludec (TRESIBA FLEXTOUCH) 100 UNIT/ML FlexTouch Pen Inject 64 Units into the skin daily.    [provider]  insulin lispro (HUMALOG) 100 UNIT/ML injection Correction scale: take 1 unit per 50 over 150 before meals up to three times daily.  Up to 20 units per day. 02/12/19   Mikey College, NP  Insulin Pen Needle (PEN NEEDLES) 32G X 5 MM MISC 1 Device by Does not apply route daily. 08/26/19   Malfi, [name], FNP  lactase (LACTAID) 3000 units tablet Take by mouth as needed.     [provider]  Lancets (FREESTYLE) lancets Must test x4/day 06/19/16   [provider]  losartan (COZAAR) 50 MG tablet Take 1 tablet (50 mg total) by mouth 2 (two) times daily. 11/14/20   Minna Merritts, MD  nebivolol (BYSTOLIC) 10 MG tablet Take 1 tablet (10 mg total) by mouth 2 (two) times daily. 05/24/21   Minna Merritts, MD  ondansetron (ZOFRAN-ODT) 4 MG disintegrating tablet Take 1 tablet (4 mg total) by mouth every 8 (eight) hours as needed for nausea or vomiting. 03/29/20   Malfi, [name], FNP  rosuvastatin (CRESTOR) 10 MG tablet Take 1 tablet (10 mg total) by mouth daily. 05/31/21   Minna Merritts, MD     Family History Family History  Problem Relation Age of Onset   Hypertension Mother    Diabetes Mother    Hyperlipidemia Mother    Congestive Heart Failure Mother    Cancer Mother    Non-Hodgkin's lymphoma Sister    Cancer Sister    Diabetes Maternal Grandmother    Breast cancer Neg Hx     Social History Social History   Tobacco Use   Smoking status: Former    Types: Cigarettes    Quit date: 01/22/1996    Years since quitting: 25.3   Smokeless tobacco: Never  Vaping Use   Vaping Use: Never used  Substance Use Topics   Alcohol use: Yes    Comment: occasional-once every 6 months   Drug use: Never     Allergies   Amlodipine, Aspirin, Bee venom, Ciprofloxacin, Codeine, Erythromycin, Glimepiride, Hydralazine, Hydrochlorothiazide, Hydrocodone, Hydrocodone-acetaminophen, Hydromorphone, Irbesartan, Irbesartan-hydrochlorothiazide, Lisinopril, Maxitrol [neomycin-polymyxin-dexameth], Metformin and related, Metformin hcl, Methylcellulose, Metoclopramide, Metoprolol, Neomycin-bacitracin zn-polymyx, Norvasc [amlodipine besylate], Nsaids, Omeprazole magnesium, Other, Oxycodone-acetaminophen, Penicillins, Pioglitazone, Poison sumac extract, Prilosec [omeprazole], Silver sulfadiazine, Sulfa antibiotics, Tape, Tetanus toxoid, Tramadol, and Betadine [povidone iodine]   Review of Systems Review of Systems  Constitutional:  Positive for fever. Negative for appetite change, chills and diaphoresis.  HENT:  Positive for rhinorrhea.   Respiratory:  Positive for cough and wheezing. Negative for chest tightness and shortness of breath.   Cardiovascular:  Negative for chest pain.    Physical Exam Triage Vital Signs ED Triage Vitals  Enc Vitals Group     BP 06/09/21 1849 (!) 150/60     Pulse Rate 06/09/21 1847 74     Resp 06/09/21 1847 19     Temp 06/09/21 1847 98.5 F (36.9 C)     Temp src --      SpO2 06/09/21 1847 98 %     Weight --      Height --      Head Circumference --       Peak Flow --      Pain Score 06/09/21 1846 0     Pain Loc --  Pain Edu? --      Excl. in [place]? --    No data found.  Updated Vital Signs BP (!) 150/60    Pulse 74    Temp 98.5 F (36.9 C)    Resp 19    SpO2 98%   Visual Acuity Right Eye Distance:   Left Eye Distance:   Bilateral Distance:    Right Eye Near:   Left Eye Near:    Bilateral Near:     Physical Exam Physical Exam Constitutional:      General: He is not in acute distress.    Appearance: He is not toxic-appearing.  HENT:     Head: Normocephalic.     Right Ear: Tympanic membrane, ear canal and external ear normal.     Left Ear: Ear canal and external ear normal.     Nose: Nose normal.     Mouth/Throat:     Mouth: Mucous membranes are moist.     Pharynx: Oropharynx is clear.  Eyes:     General: No scleral icterus.    Conjunctiva/sclera: Conjunctivae normal.  Cardiovascular:     Rate and Rhythm: Normal rate and regular rhythm.     Heart sounds: 2/6  murmur heard.   Pulmonary: has intermittent cough attacks    Effort: Pulmonary effort is normal. No respiratory distress.     Breath sounds: Wheezing present.     Comments: Has auditory wheezing. After duo neb the wheezing resolved and her cough attacks were less Musculoskeletal:        General: Normal range of motion.     Cervical back: Neck supple.  Lymphadenopathy:     Cervical: No cervical adenopathy.  Skin:    General: Skin is warm and dry.     Findings: No rash.  Neurological:     Mental Status: He is alert and oriented to person, place, and time.     Gait: Gait normal.  Psychiatric:        Mood and Affect: Mood normal.        Behavior: Behavior normal.        Thought Content: Thought content normal.        Judgment: Judgment normal.    UC Treatments / Results  Labs (all labs ordered are listed, but only abnormal results are displayed) Labs Reviewed - No data to display  EKG   Radiology DG Chest 2 View  Result Date:  06/09/2021 CLINICAL DATA:  Cough for 7 weeks. EXAM: CHEST - 2 VIEW COMPARISON:  None. FINDINGS: Heart size is normal. Changes of COPD present. No focal airspace disease is present. No edema or effusion is present. Mild degenerative changes are present in the thoracic spine. Right shoulder arthroplasty noted. IMPRESSION: 1. COPD. 2. No acute cardiopulmonary disease. Electronically Signed   By: San Morelle M.D.   On: 06/09/2021 19:16    Procedures Procedures (including critical care time)  Medications Ordered in UC Medications  ipratropium-albuterol (DUONEB) 0.5-2.5 (3) MG/3ML nebulizer solution 3 mL (3 mLs Nebulization Given 06/09/21 1907)    Initial Impression / Assessment and Plan / UC Course  I have reviewed the triage vital signs and the nursing notes. Pertinent  imaging results that were available during my care of the patient were reviewed by me and considered in my medical decision making (see chart for details). Acute Bronchitis with xray showing COPD changes. Was a past smoker. I placed her on Doxy and Symbicort as noted. Told she may continue her Albuterol inhaler.  Needs to Fu with her PCP next week. See instructions.     Final Clinical Impressions(s) / UC Diagnoses   Final diagnoses:  Bronchitis     Discharge Instructions      Your chest xray shows changes consistent with COPD. I am giving your literature in your pack to read about it and I want you to please follow up with your primary care doctor next week. You may need to have a breathing test called pulmonary function test.      ED Prescriptions     Medication Sig Dispense Auth. Provider   budesonide-formoterol (SYMBICORT) 80-4.5 MCG/ACT inhaler Inhale 2 puffs into the lungs 2 (two) times daily. 1 each Rodriguez-Southworth, Sunday Spillers, PA-C   doxycycline (VIBRAMYCIN) 100 MG capsule Take 1 capsule (100 mg total) by mouth 2 (two) times daily. 20 capsule Rodriguez-Southworth, Sunday Spillers, PA-C      PDMP not  reviewed this encounter.   Shelby Mattocks, Hershal Coria 06/09/21 2026

## 2021-06-09 NOTE — Discharge Instructions (Signed)
Your chest xray shows changes consistent with COPD. I am giving your literature in your pack to read about it and I want you to please follow up with your primary care doctor next week. You may need to have a breathing test called pulmonary function test.

## 2021-06-09 NOTE — ED Triage Notes (Addendum)
Pt presents with continued cough. Pt was seen here x 7 weeks ago. Denies relief with albuterol inhaler.

## 2021-06-14 ENCOUNTER — Telehealth: Payer: Self-pay | Admitting: Cardiovascular Disease

## 2021-06-14 NOTE — Telephone Encounter (Signed)
Patient states she has been exposed to some cleaning products and has been coughing ever since then. She has been to urgent care and states she has not been able to get in with her PCP for weeks. She would like a recommendation of where she should go. Please call to advise.

## 2021-06-14 NOTE — Telephone Encounter (Signed)
Was able to return call to [name] regarding her respiratory concerns. Pt reports exposed to chemical product at [employer], since exposed has had "terrible cough".   Seen UC x2 in the past 2 weeks, given albuterol and Symbicort, along with prednisone and doxycycline.   Advised to call Dr. Doy Hutching office again to see for sooner appt, can see P/NP as well (pt refused, only wants Dr. Doy Hutching). As she is not getting better and cough persistence (noted on phone conversation). Dx with bronchitis at [facility], needs to be reevaluated, explained bronchitis can takes weeks to months to fully recover, with having COPD as well can exacerbate cough/lung issues to worsens  Pt reports was unaware of COPD until CXR at [facility] 12/30, never been dx with in past.    CXR (06/09/21) FINDINGS: Heart size is normal. Changes of COPD present. No focal airspace disease is present. No edema or effusion is present. Mild degenerative changes are present in the thoracic spine. Right shoulder arthroplasty noted.   IMPRESSION: 1. COPD. 2. No acute cardiopulmonary disease.  Suggested calling Dr. Doy Hutching office, request appt with waitlist regarding new COPD findings and worsening of cough, also can ask for referral to pulmonary for lung concerns and COPD f/u. [name] verbalized understanding.  Advised if still cannot get in to Dr. Doy Hutching in a timely manner, s/s get worse, SOB, CP, coughing up blood, or other symptoms then seek ER as she has already seen UC 2 in past 2 weeks, the ER can do further work-up. Otherwise all questions were address and no additional concerns at this time. [name] thankful for the return call and advice. Agreeable to plan, will call back for anything further.

## 2021-07-04 ENCOUNTER — Ambulatory Visit (INDEPENDENT_AMBULATORY_CARE_PROVIDER_SITE_OTHER): Payer: Medicare Other

## 2021-07-04 ENCOUNTER — Other Ambulatory Visit (INDEPENDENT_AMBULATORY_CARE_PROVIDER_SITE_OTHER): Payer: Self-pay | Admitting: Nurse Practitioner

## 2021-07-04 ENCOUNTER — Ambulatory Visit (INDEPENDENT_AMBULATORY_CARE_PROVIDER_SITE_OTHER): Payer: Medicare Other | Admitting: Vascular Surgery

## 2021-07-04 ENCOUNTER — Encounter (INDEPENDENT_AMBULATORY_CARE_PROVIDER_SITE_OTHER): Payer: Self-pay | Admitting: Vascular Surgery

## 2021-07-04 ENCOUNTER — Other Ambulatory Visit: Payer: Self-pay

## 2021-07-04 VITALS — BP 149/77 | HR 74 | Resp 17 | Ht 69.0 in | Wt 235.0 lb

## 2021-07-04 DIAGNOSIS — E782 Mixed hyperlipidemia: Secondary | ICD-10-CM | POA: Diagnosis not present

## 2021-07-04 DIAGNOSIS — R1084 Generalized abdominal pain: Secondary | ICD-10-CM

## 2021-07-04 DIAGNOSIS — E1140 Type 2 diabetes mellitus with diabetic neuropathy, unspecified: Secondary | ICD-10-CM | POA: Diagnosis not present

## 2021-07-04 DIAGNOSIS — I890 Lymphedema, not elsewhere classified: Secondary | ICD-10-CM | POA: Diagnosis not present

## 2021-07-04 NOTE — Progress Notes (Signed)
MRN : [id number]  [name] is a 76 y.o. ([birth year]) female who presents with chief complaint of  Chief Complaint  Patient presents with   Follow-up    ultrasound  .  History of Present Illness: Patient returns today in follow up on referral for abdominal pain to evaluate for a possible vascular cause.  She has previously been seen for lymphedema which is now under good control.  Her leg swelling is much better.  No open ulcerations or wounds.  She wears her stockings regularly. As per her abdominal pain, it is not necessarily related to eating.  She has not had unintentional weight loss.  No food fear. Mesenteric duplex performed today showed no evidence of abdominal aortic aneurysm, splenic or visceral artery aneurysm, and normal velocities in the celiac, SMA, and IMA  Current Outpatient Medications  Medication Sig Dispense Refill   acetaminophen (TYLENOL) 500 MG tablet Take 500 mg by mouth daily as needed.     albuterol (VENTOLIN HFA) 108 (90 Base) MCG/ACT inhaler Inhale 1-2 puffs into the lungs every 4 (four) hours as needed for wheezing or shortness of breath. 1 g 0   ARTIFICIAL TEAR OP Apply to eye as needed.      BD INSULIN SYRINGE U/F 31G X 5/16" 0.5 ML MISC USE AS DIRECTED 100 each 2   BD INSULIN SYRINGE U/F 31G X 5/16" 1 ML MISC USE AS DIRECTED. WITH HUMALOG 100 each 5   BIOTIN PO Take 1 mg by mouth daily.      budesonide-formoterol (SYMBICORT) 80-4.5 MCG/ACT inhaler Inhale 2 puffs into the lungs 2 (two) times daily. 1 each 0   Cholecalciferol (VITAMIN D3) 25 MCG (1000 UT) CAPS Take 4 capsules by mouth daily.      clindamycin (CLEOCIN) 300 MG capsule Take 600 mg by mouth. 1 Hour before dental procedures     cloNIDine (CATAPRES - DOSED IN MG/24 HR) 0.1 mg/24hr patch Place 0.1 mg onto the skin once a week.     cloNIDine (CATAPRES) 0.1 MG tablet Take 1 tablet (0.1 mg total) by mouth 3 (three) times daily as needed. Take 1 tablet (0.1 mg) by mouth three times daily 270  tablet 3   COLCRYS 0.6 MG tablet Take 1 tablet (0.6 mg total) by mouth 2 (two) times daily as needed. For up to 3 days for pericarditis, or up to 7 days for gout or connective tissue disorder. 60 tablet 2   cyclobenzaprine (FLEXERIL) 5 MG tablet Take 1 tablet (5 mg total) by mouth daily as needed for muscle spasms. 30 tablet 2   ezetimibe (ZETIA) 10 MG tablet TAKE 1 TABLET BY MOUTH  DAILY 90 tablet 0   famotidine (PEPCID) 40 MG tablet Take 40 mg by mouth 2 (two) times daily.     FREESTYLE LITE test strip USE AS DIRECTED THREE TIMES DAILY FOR DIABETES 100 strip 4   insulin degludec (TRESIBA FLEXTOUCH) 100 UNIT/ML FlexTouch Pen Inject 64 Units into the skin daily.     insulin lispro (HUMALOG) 100 UNIT/ML injection Correction scale: take 1 unit per 50 over 150 before meals up to three times daily.  Up to 20 units per day. 20 mL 1   Insulin Pen Needle (PEN NEEDLES) 32G X 5 MM MISC 1 Device by Does not apply route daily. 100 each 3   lactase (LACTAID) 3000 units tablet Take by mouth as needed.      Lancets (FREESTYLE) lancets Must test x4/day     losartan (  COZAAR) 50 MG tablet Take 1 tablet (50 mg total) by mouth 2 (two) times daily. 180 tablet 3   nebivolol (BYSTOLIC) 10 MG tablet Take 1 tablet (10 mg total) by mouth 2 (two) times daily. 180 tablet 3   ondansetron (ZOFRAN-ODT) 4 MG disintegrating tablet Take 1 tablet (4 mg total) by mouth every 8 (eight) hours as needed for nausea or vomiting. 30 tablet 0   rosuvastatin (CRESTOR) 10 MG tablet Take 1 tablet (10 mg total) by mouth daily. 90 tablet 1   doxycycline (VIBRAMYCIN) 100 MG capsule Take 1 capsule (100 mg total) by mouth 2 (two) times daily. (Patient not taking: Reported on 07/04/2021) 20 capsule 0   No current facility-administered medications for this visit.    Past Medical History:  Diagnosis Date   Anterolisthesis    Cervical spine   Asthma    Connective tissue disorder ([place])    recurrent carotid arteritis, temporal arteritis,  vasculitis mandible, general hepatitis, avascular necrosis bilat   DDD (degenerative disc disease), lumbar    Diabetes mellitus without complication (HCC)    Diverticulosis    Duodenitis    Ganglion cyst of right foot    Gouty arthritis    Hiatal hernia with GERD    History of cardiovascular stress test    a. 07/2018 [facility]): Fixed inferoapical defect w/ nl contraction-->attenuation artifact. No ischemia. EF 63%.   Hyperlipidemia    Hypertension    a. 02/2019 Renal artery duplex: no evidence of prox RAS.   Hyperuricemia    Keratitis sicca, bilateral (HCC)    Lymphedema of both lower extremities    Osteoarthritis    Pericarditis    Recurrent: Aug 97, July 05, Sept 08, July 16, July 18   PUD (peptic ulcer disease)    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[place])    Sjogren's syndrome ([place])    Syncope    Tendonitis, Achilles, right    Tortuous colon    Trigger finger     Past Surgical History:  Procedure Laterality Date   ABDOMINAL HYSTERECTOMY     BREAST BIOPSY     BREAST EXCISIONAL BIOPSY Left 1986   neg   CHOLECYSTECTOMY     COLONOSCOPY WITH PROPOFOL N/A 08/26/2018   Procedure: COLONOSCOPY WITH PROPOFOL;  Surgeon: Lucilla Lame, MD;  Location: [facility] ENDOSCOPY;  Service: Endoscopy;  Laterality: N/A;   CYSTOSCOPY  02/26/2018   [name], MD    distal arthrectomy     ESOPHAGOGASTRODUODENOSCOPY (EGD) WITH PROPOFOL N/A 08/26/2018   Procedure: ESOPHAGOGASTRODUODENOSCOPY (EGD) WITH PROPOFOL;  Surgeon: Lucilla Lame, MD;  Location: [facility] ENDOSCOPY;  Service: Endoscopy;  Laterality: N/A;   EXCISION NEUROMA     KNEE SURGERY Bilateral    1985, 2001, 11/2002, 12/2006   panhysterectomy  10/1983   SHOULDER SURGERY Right    reverse total arthroplasty w/ biceps tenodesis   TONSILLECTOMY AND ADENOIDECTOMY     TOTAL HIP ARTHROPLASTY Right 04/2007   WISDOM TOOTH EXTRACTION       Social History   Tobacco Use   Smoking status: Former    Types: Cigarettes    Quit date: 01/22/1996    Years since  quitting: 25.4   Smokeless tobacco: Never  Vaping Use   Vaping Use: Never used  Substance Use Topics   Alcohol use: Yes    Comment: occasional-once every 6 months   Drug use: Never       Family History  Problem Relation Age of Onset   Hypertension Mother    Diabetes  Mother    Hyperlipidemia Mother    Congestive Heart Failure Mother    Cancer Mother    Non-Hodgkin's lymphoma Sister    Cancer Sister    Diabetes Maternal Grandmother    Breast cancer Neg Hx     Allergies  Allergen Reactions   Amlodipine     Other reaction(s): Unknown   Aspirin     Other reaction(s): Unknown   Bee Venom    Ciprofloxacin     Other reaction(s): Other (see comments), Unknown   Codeine     Other reaction(s): Unknown Must have pre medications before taking per patient report Includes all derivatives    Erythromycin     Other reaction(s): Unknown, Unknown   Glimepiride     Other reaction(s): Unknown   Hydralazine     Other reaction(s): Unknown   Hydrochlorothiazide     Other reaction(s): Dizziness or lightheadedness.   Hydrocodone     Other reaction(s): Unknown Must have pre medications before taking per patient report   Hydrocodone-Acetaminophen Nausea And Vomiting    MUST BE PREMEDICATED   Hydromorphone Nausea And Vomiting    Other reaction(s): Unknown Must have pre medications before taking per patient report MUST BE PREMEDICATED    Irbesartan     Other reaction(s): Unknown   Irbesartan-Hydrochlorothiazide     Other reaction(s): Unknown   Lisinopril     Other reaction(s): Unknown   Maxitrol [Neomycin-Polymyxin-Dexameth]     Eye drop     Metformin And Related    Metformin Hcl     Other reaction(s): Unknown   Methylcellulose     Other reaction(s): Unknown Eye drops     Metoclopramide Nausea And Vomiting    Other reaction(s): Unknown   Metoprolol     Other reaction(s): Unknown   Neomycin-Bacitracin Zn-Polymyx     Other reaction(s): Unknown   Norvasc [Amlodipine  Besylate]    Nsaids Other (See Comments)    Other reaction(s): Unknown bleeding    Omeprazole Magnesium     Other reaction(s): Unknown   Other     Pt is allergic to staples and surgical metals   Oxycodone-Acetaminophen     Other reaction(s): Unknown, Unknown Must have pre medications before taking per patient report    Penicillins     Other reaction(s): Unknown, Unknown   Pioglitazone     Other reaction(s): Unknown   Poison [place] Extract     Poison [place] and [place] also   Prilosec [Omeprazole]    Silver Sulfadiazine     Other reaction(s): Unknown   Sulfa Antibiotics     Other reaction(s): Unknown, Unknown   Tape     Other reaction(s): Unknown   Tetanus Toxoid Other (See Comments)   Tramadol     Other reaction(s): Unknown Must have pre medications before taking per patient report   Betadine [Povidone Iodine] Rash    Only topically when left on skin.    REVIEW OF SYSTEMS (Negative unless checked)   Constitutional: [] Weight loss  [] Fever  [] Chills Cardiac: [] Chest pain   [] Chest pressure   [] Palpitations   [] Shortness of breath when laying flat   [] Shortness of breath at rest   [] Shortness of breath with exertion. Vascular:  [] Pain in legs with walking   [] Pain in legs at rest   [] Pain in legs when laying flat   [] Claudication   [] Pain in feet when walking  [] Pain in feet at rest  [] Pain in feet when laying flat   [] History of DVT   []   Phlebitis   [x] Swelling in legs   [] Varicose veins   [] Non-healing ulcers Pulmonary:   [] Uses home oxygen   [] Productive cough   [] Hemoptysis   [] Wheeze  [] COPD   [] Asthma Neurologic:  [] Dizziness  [] Blackouts   [] Seizures   [] History of stroke   [] History of TIA  [] Aphasia   [] Temporary blindness   [] Dysphagia   [] Weakness or numbness in arms   [] Weakness or numbness in legs Musculoskeletal:  [x] Arthritis   [] Joint swelling   [] Joint pain   [x] Low back pain Hematologic:  [] Easy bruising  [] Easy bleeding   [] Hypercoagulable state   [] Anemic   [] Hepatitis Gastrointestinal:  [] Blood in stool   [] Vomiting blood  [x] Gastroesophageal reflux/heartburn   [] Abdominal pain Genitourinary:  [] Chronic kidney disease   [] Difficult urination  [] Frequent urination  [] Burning with urination   [] Hematuria Skin:  [] Rashes   [] Ulcers   [] Wounds Psychological:  [] History of anxiety   []  History of major depression.  Physical Examination  BP (!) 149/77 (BP Location: Left Arm)    Pulse 74    Resp 17    Ht 5\' 9"  (1.753 m)    Wt 235 lb (106.6 kg)    BMI 34.70 kg/m  Gen:  WD/WN, NAD Head: [place]/AT, No temporalis wasting. Ear/Nose/Throat: Hearing grossly intact, nares w/o erythema or drainage Eyes: Conjunctiva clear. Sclera non-icteric Neck: Supple.  Trachea midline Pulmonary:  Good air movement, no use of accessory muscles.  Cardiac: RRR, no JVD Vascular:  Vessel Right Left  Radial Palpable Palpable                                   Gastrointestinal: soft, non-tender/non-distended. No guarding/reflex.  Musculoskeletal: M/S 5/5 throughout.  No deformity or atrophy.  1+ bilateral lower extremity edema. Neurologic: Sensation grossly intact in extremities.  Symmetrical.  Speech is fluent.  Psychiatric: Judgment intact, Mood & affect appropriate for pt's clinical situation. Dermatologic: No rashes or ulcers noted.  No cellulitis or open wounds.      Labs Recent Results (from the past 2160 hour(s))  TSH     Status: None   Collection Time: 04/18/21  5:26 PM  Result Value Ref Range   TSH 0.766 0.350 - 4.500 uIU/mL    Comment: Performed by a 3rd Generation assay with a functional sensitivity of <=0.01 uIU/mL. Performed at [facility], [address] [place], [place] [address]   CBC     Status: Abnormal   Collection Time: 04/18/21  5:26 PM  Result Value Ref Range   WBC 11.8 (H) 4.0 - 10.5 K/uL   RBC 5.13 (H) 3.87 - 5.11 MIL/uL   Hemoglobin 14.5 12.0 - 15.0 g/dL   HCT 43.3 36.0 - 46.0 %   MCV 84.4 80.0 - 100.0 fL   MCH 28.3  26.0 - 34.0 pg   MCHC 33.5 30.0 - 36.0 g/dL   RDW 13.1 11.5 - 15.5 %   Platelets 281 150 - 400 K/uL   nRBC 0.0 0.0 - 0.2 %    Comment: Performed at [facility], [address], [place], [place] [address]  Magnesium     Status: None   Collection Time: 04/18/21  5:26 PM  Result Value Ref Range   Magnesium 1.9 1.7 - 2.4 mg/dL    Comment: Performed at [facility], [address], [place], [place] [address]  Basic metabolic panel     Status: Abnormal  Collection Time: 04/18/21  5:26 PM  Result Value Ref Range   Sodium 137 135 - 145 mmol/L   Potassium 4.2 3.5 - 5.1 mmol/L   Chloride 102 98 - 111 mmol/L   CO2 27 22 - 32 mmol/L   Glucose, Bld 120 (H) 70 - 99 mg/dL    Comment: Glucose reference range applies only to samples taken after fasting for at least 8 hours.   BUN 21 8 - 23 mg/dL   Creatinine, Ser 0.99 0.44 - 1.00 mg/dL   Calcium 9.0 8.9 - 10.3 mg/dL   GFR, Estimated 60 (L) >60 mL/min    Comment: (NOTE) Calculated using the CKD-EPI Creatinine Equation (2021)    Anion gap 8 5 - 15    Comment: Performed at [facility], [address] [place], [place] [address]  ECHOCARDIOGRAM COMPLETE     Status: None   Collection Time: 05/26/21  2:33 PM  Result Value Ref Range   AR max vel 2.19 cm2   AV Peak grad 12.8 mmHg   Ao pk vel 1.79 m/s   S' Lateral 3.00 cm   Area-P 1/2 2.62 cm2   AV Area VTI 2.34 cm2   AV Mean grad 7.0 mmHg   Single Plane A4C EF 65.0 %   Single Plane A2C EF 55.0 %   Calc EF 59.2 %   AV Area mean vel 2.19 cm2  SARS CORONAVIRUS 2 (TAT 6-24 HRS) Nasopharyngeal Nasopharyngeal Swab     Status: None   Collection Time: 06/02/21  3:29 PM   Specimen: Nasopharyngeal Swab  Result Value Ref Range   SARS Coronavirus 2 NEGATIVE NEGATIVE    Comment: (NOTE) SARS-CoV-2 target nucleic acids are NOT DETECTED.  The SARS-CoV-2 RNA is generally detectable in upper and lower respiratory specimens during the acute phase of infection.  Negative results do not preclude SARS-CoV-2 infection, do not rule out co-infections with other pathogens, and should not be used as the sole basis for treatment or other patient management decisions. Negative results must be combined with clinical observations, patient history, and epidemiological information. The expected result is Negative.  Fact Sheet for Patients: SugarRoll.be  Fact Sheet for Healthcare Providers: https://www.woods-mathews.com/  This test is not yet approved or cleared by the [place] FDA and  has been authorized for detection and/or diagnosis of SARS-CoV-2 by FDA under an Emergency Use Authorization (EUA). This EUA will remain  in effect (meaning this test can be used) for the duration of the COVID-19 declaration under Se ction 564(b)(1) of the Act, 21 U.S.C. section 360bbb-3(b)(1), unless the authorization is terminated or revoked sooner.  Performed at [facility], [place] [address] [place], [place] [address]     Radiology DG Chest 2 View  Result Date: 06/09/2021 CLINICAL DATA:  Cough for 7 weeks. EXAM: CHEST - 2 VIEW COMPARISON:  None. FINDINGS: Heart size is normal. Changes of COPD present. No focal airspace disease is present. No edema or effusion is present. Mild degenerative changes are present in the thoracic spine. Right shoulder arthroplasty noted. IMPRESSION: 1. COPD. 2. No acute cardiopulmonary disease. Electronically Signed   By: San Morelle M.D.   On: 06/09/2021 19:16    Assessment/Plan Benign essential HTN blood pressure control important in reducing the progression of atherosclerotic disease. On appropriate oral medications.  If she has significant renal artery stenosis, she said she would contact us and we will be happy to perform intervention for her renal artery disease     Hyperlipidemia  lipid control important in reducing the progression of atherosclerotic disease. Continue  statin therapy  Lymphedema Symptoms are under good control with compression socks, elevation, and extensive treatment from the lymphedema clinic previously.  Type 2 diabetes, controlled, with neuropathy (HCC) blood glucose control important in reducing the progression of atherosclerotic disease. Also, involved in wound healing. On appropriate medications.   Abdominal pain Mesenteric duplex performed today showed no evidence of abdominal aortic aneurysm, splenic or visceral artery aneurysm, and normal velocities in the celiac, SMA, and IMA.  I do not see a vascular cause of her abdominal pain.  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  At this point, no further vascular work-up is planned.    Leotis Pain, MD  07/05/2021 8:53 AM    This note was created with Dragon medical transcription system.  Any errors from dictation are purely unintentional

## 2021-07-05 DIAGNOSIS — R109 Unspecified abdominal pain: Secondary | ICD-10-CM | POA: Insufficient documentation

## 2021-07-05 NOTE — Assessment & Plan Note (Signed)
Symptoms are under good control with compression socks, elevation, and extensive treatment from the lymphedema clinic previously.

## 2021-07-05 NOTE — Assessment & Plan Note (Signed)
Mesenteric duplex performed today showed no evidence of abdominal aortic aneurysm, splenic or visceral artery aneurysm, and normal velocities in the celiac, SMA, and IMA.  I do not see a vascular cause of her abdominal pain.  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  At this point, no further vascular work-up is planned.

## 2021-07-05 NOTE — Assessment & Plan Note (Signed)
blood glucose control important in reducing the progression of atherosclerotic disease. Also, involved in wound healing. On appropriate medications.  

## 2021-07-25 ENCOUNTER — Telehealth: Payer: Self-pay

## 2021-07-25 NOTE — Telephone Encounter (Signed)
Incoming fax  We are please to inform you that we have approved your patient [name] for Bystolic 10 mg tablet (nebivolol) assistance through 06/10/2022

## 2021-08-04 ENCOUNTER — Telehealth: Payer: Self-pay | Admitting: Cardiovascular Disease

## 2021-08-04 NOTE — Telephone Encounter (Signed)
Patient wants to discuss her extensive other medical conditions with pulmonology and her known high hypertension.condition. She also states she needs her carotid appt rescheduled. Notes state it was cancelled per [facility]. Please advise, thanks!

## 2021-08-04 NOTE — Telephone Encounter (Signed)
Confirmed that carotid was canceled back in Nov (05/10/2022) per Dr. Donivan Scull request after speaking with pt after her viist and both had decided that her carotid could be cancel   Pt needs OV to discuss her medical "condition" and history before her upcoming procedure to be schedule.  Will need to discuss with Dr. Rockey Situ regarding carotid u/s to be resulted if warranted.   Pt schedule 3/24 with Dr. Rockey Situ to discuss both in person.

## 2021-08-08 ENCOUNTER — Telehealth: Payer: Self-pay | Admitting: Cardiovascular Disease

## 2021-08-08 NOTE — Telephone Encounter (Signed)
Patient would like to know Dr Donivan Scull opinion about if she should have a bronchial challenge with methacholine test Please call to discuss

## 2021-08-10 ENCOUNTER — Other Ambulatory Visit: Payer: Self-pay | Admitting: Pulmonary Disease

## 2021-08-10 DIAGNOSIS — J4530 Mild persistent asthma, uncomplicated: Secondary | ICD-10-CM

## 2021-08-14 NOTE — Telephone Encounter (Signed)
Attempted to reach pt via phone, unable to make contact, unable to LM on cell VM. ? ?Sent MyChart message advising Dr. Donivan Scull response ? ?Test used by Pulmonary to look for asthma  ?Should not affect anything cardiac  ?I have never performed one so I do not know much about the procedure itself  ?We will defer to pulmonary for further discussion  ?Thx  ?TG  ? ? ?

## 2021-09-01 ENCOUNTER — Other Ambulatory Visit: Payer: Self-pay

## 2021-09-01 ENCOUNTER — Ambulatory Visit (INDEPENDENT_AMBULATORY_CARE_PROVIDER_SITE_OTHER): Payer: Medicare Other | Admitting: Cardiovascular Disease

## 2021-09-01 ENCOUNTER — Encounter: Payer: Self-pay | Admitting: Cardiovascular Disease

## 2021-09-01 VITALS — BP 120/64 | HR 77 | Ht 69.0 in | Wt 228.0 lb

## 2021-09-01 DIAGNOSIS — E782 Mixed hyperlipidemia: Secondary | ICD-10-CM | POA: Diagnosis not present

## 2021-09-01 DIAGNOSIS — I739 Peripheral vascular disease, unspecified: Secondary | ICD-10-CM | POA: Diagnosis not present

## 2021-09-01 DIAGNOSIS — I10 Essential (primary) hypertension: Secondary | ICD-10-CM

## 2021-09-01 DIAGNOSIS — I700 Atherosclerosis of aorta: Secondary | ICD-10-CM

## 2021-09-01 DIAGNOSIS — I6523 Occlusion and stenosis of bilateral carotid arteries: Secondary | ICD-10-CM

## 2021-09-01 DIAGNOSIS — E7800 Pure hypercholesterolemia, unspecified: Secondary | ICD-10-CM

## 2021-09-01 DIAGNOSIS — I7122 Aneurysm of the aortic arch, without rupture: Secondary | ICD-10-CM

## 2021-09-01 DIAGNOSIS — R55 Syncope and collapse: Secondary | ICD-10-CM

## 2021-09-01 NOTE — Patient Instructions (Addendum)
Medication Instructions:  ?No changes ? ?If you need a refill on your cardiac medications before your next appointment, please call your pharmacy.  ? ?Lab work: ?No new labs needed ? ?Testing/Procedures: ?We have order the carotid u/s, please schedule at your convenience ?Your physician has requested that you have a carotid duplex. This test is an ultrasound of the carotid arteries in your neck. It looks at blood flow through these arteries that supply the brain with blood. Allow one hour for this exam. There are no restrictions or special instructions. ? ? ?Follow-Up: ?At [facility], you and your health needs are our priority.  As part of our continuing mission to provide you with exceptional heart care, we have created designated Provider Care Teams.  These Care Teams include your primary Cardiologist (physician) and Advanced Practice Providers (APPs -  Physician Assistants and Nurse Practitioners) who all work together to provide you with the care you need, when you need it. ? ?You will need a follow up appointment in 12 months ? ?Providers on your designated Care Team:   ?Murray Hodgkins, NP ?Christell Faith, PA-C ?Cadence Kathlen Mody, PA-C ? ?COVID-19 Vaccine Information can be found at: [email or web address].uk For questions related to vaccine distribution or appointments, please email [email or web address] or call [phone].  ? ?

## 2021-09-01 NOTE — Progress Notes (Signed)
Cardiology Office Note ? ?Date:  09/01/2021  ? ?ID:  [name], DOB [birth year], MRN [id number] ? ?PCP:  Idelle Crouch, MD  ? ?Chief Complaint  ?Patient presents with  ? Follow-up  ?  Needs to discuss having a bronchial challenge. Patient c/o shortness of breath with little exertion. Medications reviewed by the patient verbally.   ? ? ?HPI:  ?[name] is a 76 year old woman with past medical history of ?Former smoker  ?PAD/carotid ?HTN ?urinary incontinence, chronic UTIs ?Morbid obesity ?Moderate to large hiatal hernia. ?bleeding ulcers x 3, ?Aortic atherosclerosis on CT scan ?Syncope ?CT scan: small peripherally calcified focal outpouching,  ?most suggestive of a small saccular aneurysm, or less favored, a  ?penetrating aortic ulcer.   ?Hiatal hernia ?cough with recurrent bronchitis. 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 ?LOV 11/22 ? ?Echocardiogram ?Normal LV function and RV function ?Normal pressures, no significant valve disease ? ?Reaction to breathing in store cleaner ?received prednisone and symbicort as well as albuterol ?Seen by pulmonary for SOB, bronchitis ?Additional testing pending ? ?Carotid: 2022 reviewed ?Right Carotid: Velocities in the right ICA are consistent with a 40-59%  stenosis. The ECA appears >50% stenosed. ? ?Venous insuff noted ?Wraps daily ?Does lymph pumps daily ? ?Tolerating clonidine patch, and clonidine pill ?Periodic lightheadedness/orthostasis ? ?Labs reviewed ?Total chol 171, LDL 65 ?HGBA1C 8.8 ? ?Carotid u/s ?Right Carotid: 40-59% stenosis.  ?Mild on left ? ?Intolerance to Lasix ? ?EKG personally reviewed by myself on todays visit ?NSr rate 77 bpm no ST or T wave changes ? ?Other past medical hx reviewed ?Oct 16th 2022, Sunday afternoon, BP 140s/80s ?Had shampoos hair,felt weird ?Woke up on shower floor ?EMT called ?[place] ?126/80 per  EMTs ? ?Zio  reviewed ?Patient had a min HR of 47 bpm, max HR of 154 bpm, and avg HR of 64 bpm.  ?Sinus Rhythm.  ?7 Supraventricular Tachycardia runs occurred, the run with the fastest interval lasting 18 beats with a max rate of 154 bpm, the  longest lasting 17 beats with an avg rate of 107 bpm. Isolated SVEs were rare (<1.0%), SVE Couplets were rare (<1.0%), and SVE Triplets were rare (<1.0%). Isolated VEs were rare (<1.0%, 275), VE Triplets were rare (<1.0%, 2), and no VE Couplets were  ?present.  ? ?Seen in the hospital September 10, 2019 at [facility] ?CT scan at [facility] ?Mild atherosclerosis in the thoracic aorta. At the lateral aspect of the  ?aortic arch, there is a small peripherally calcified focal outpouching,  ?most suggestive of a small saccular aneurysm, or less favored, a  ?penetrating aortic ulcer.   ? ?Intolerance of HCTZ, sulfa ?Unable to tolerate calcium channel blocker secondary to leg swelling and lymphedema ? ? long history of pericarditis, pericardial effusion ?Previously managed by [facility] ?Reports being treated with colchicine in the past, ?Also previously treated with long course of steroids for unspecified connective tissue disorder ? ?CT scan 06/2018 ?Mild to moderate diffuse descending aortic atherosclerosis extending into the common iliac arteries ?Coronary calcification in the right coronary artery ? ?Carotid u/s 05/2018 ?Less than 50% stenosis in the right and left internal carotid ?Arteries. ? ? 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 ? ?PSH:    ?Past Surgical History:  ?Procedure Laterality Date  ? ABDOMINAL HYSTERECTOMY    ? BREAST BIOPSY    ? BREAST EXCISIONAL BIOPSY Left 1986  ? neg  ? CHOLECYSTECTOMY    ? COLONOSCOPY WITH PROPOFOL N/A 08/26/2018  ? Procedure: COLONOSCOPY WITH PROPOFOL;  Surgeon: Lucilla Lame, MD;  Location: [facility] ENDOSCOPY;  Service: Endoscopy;  Laterality: N/A;  ? CYSTOSCOPY  02/26/2018  ? [name], MD   ? distal arthrectomy    ? ESOPHAGOGASTRODUODENOSCOPY (EGD) WITH PROPOFOL N/A 08/26/2018  ? Procedure: ESOPHAGOGASTRODUODENOSCOPY (EGD) WITH PROPOFOL;  Surgeon: Lucilla Lame, MD;  Location: [facility] ENDOSCOPY;  Service: Endoscopy;  Laterality: N/A;  ? EXCISION NEUROMA    ? KNEE SURGERY Bilateral   ? 1985, 2001, 11/2002, 12/2006  ? panhysterectomy  10/1983  ? SHOULDER SURGERY Right   ? reverse total arthroplasty w/ biceps tenodesis  ? TONSILLECTOMY AND ADENOIDECTOMY    ? TOTAL HIP ARTHROPLASTY Right 04/2007  ? WISDOM TOOTH EXTRACTION    ? ? ?Current Outpatient Medications  ?Medication Sig Dispense Refill  ? acetaminophen (TYLENOL) 500 MG tablet Take 500 mg by mouth daily as needed.    ? albuterol (VENTOLIN HFA) 108 (90 Base) MCG/ACT inhaler Inhale 1-2 puffs into the lungs every 4 (four) hours as needed for wheezing or shortness of breath. 1 g 0  ? ARTIFICIAL TEAR OP Apply to eye as needed.     ? BD INSULIN SYRINGE U/F 31G X 5/16" 0.5 ML MISC USE AS DIRECTED 100 each 2  ? BD INSULIN SYRINGE U/F 31G X 5/16" 1 ML MISC USE AS DIRECTED. WITH HUMALOG 100 each 5  ? BIOTIN PO Take 1 mg by mouth daily.     ? budesonide-formoterol (SYMBICORT) 80-4.5 MCG/ACT inhaler Inhale 2 puffs into the lungs 2 (two) times daily. 1 each 0  ? Cholecalciferol (VITAMIN D3) 25 MCG (1000 UT) CAPS Take 4 capsules by mouth daily.     ? clindamycin (CLEOCIN) 300 MG capsule Take 600 mg by mouth. 1 Hour before dental procedures    ? cloNIDine  (CATAPRES - DOSED IN MG/24 HR) 0.1 mg/24hr patch Place 0.1 mg onto the skin once a week.    ? cloNIDine (CATAPRES) 0.1 MG tablet Take 1 tablet (0.1 mg total) by mouth 3 (three) times daily as needed. Take 1 tablet (0.1 mg) by mouth three times daily 270 tablet 3  ? COLCRYS 0.6 MG tablet Take 1 tablet (0.6 mg total) by mouth 2 (two) times daily as needed. For up to 3 days for pericarditis, or up to 7 days for gout or connective tissue disorder. 60 tablet 2  ? cyclobenzaprine (FLEXERIL) 5 MG tablet Take 1 tablet (5 mg total) by mouth daily as needed for muscle spasms. (Patient taking differently: Take 5 mg by mouth at bedtime.) 30 tablet 2  ? doxycycline (VIBRAMYCIN) 100 MG capsule Take 1 capsule (100 mg total) by mouth 2 (two) times daily. 20 capsule 0  ? ezetimibe (ZETIA) 10 MG tablet TAKE 1 TABLET BY MOUTH  DAILY 90 tablet 0  ? famotidine (PEPCID) 40 MG tablet Take 40 mg by mouth 2 (two) times daily.    ? FREESTYLE LITE test strip USE AS DIRECTED THREE TIMES DAILY FOR DIABETES 100 strip 4  ?  insulin degludec (TRESIBA FLEXTOUCH) 100 UNIT/ML FlexTouch Pen Inject 72 Units into the skin daily.    ? insulin lispro (HUMALOG) 100 UNIT/ML injection Correction scale: take 1 unit per 50 over 150 before meals up to three times daily.  Up to 20 units per day. 20 mL 1  ? Insulin Pen Needle (PEN NEEDLES) 32G X 5 MM MISC 1 Device by Does not apply route daily. 100 each 3  ? lactase (LACTAID) 3000 units tablet Take by mouth as needed.     ? Lancets (FREESTYLE) lancets Must test x4/day    ? losartan (COZAAR) 50 MG tablet Take 75 mg by mouth daily.    ? nebivolol (BYSTOLIC) 10 MG tablet Take 1 tablet (10 mg total) by mouth 2 (two) times daily. 180 tablet 3  ? ondansetron (ZOFRAN-ODT) 4 MG disintegrating tablet Take 1 tablet (4 mg total) by mouth every 8 (eight) hours as needed for nausea or vomiting. 30 tablet 0  ? rosuvastatin (CRESTOR) 10 MG tablet Take 1 tablet (10 mg total) by mouth daily. 90 tablet 1  ? ?No current  facility-administered medications for this visit.  ? ? ? ?Allergies:   Amlodipine, Aspirin, Bee venom, Ciprofloxacin, Codeine, Eggs or egg-derived products, Erythromycin, Glimepiride, Hydralazine, Hydrochlorothiazide, Hyd

## 2021-09-20 ENCOUNTER — Ambulatory Visit (INDEPENDENT_AMBULATORY_CARE_PROVIDER_SITE_OTHER): Payer: Medicare Other

## 2021-09-20 DIAGNOSIS — I6523 Occlusion and stenosis of bilateral carotid arteries: Secondary | ICD-10-CM

## 2021-09-26 ENCOUNTER — Telehealth: Payer: Self-pay | Admitting: Cardiovascular Disease

## 2021-09-26 NOTE — Telephone Encounter (Signed)
Patient called to let Dr Rockey Situ know that [facility] took her off of losartan. She stop taking clonidine.   ?Dr. Lanney Gins started her on Minoxinil '10mg'$  and Spironolactone '25mg'$  both 1 tablet daily. She wants to know if Dr. Rockey Situ is in agreement with this.   ?She also wants to if she should still be taking Bystolic '10mg'$  2x's daily.   ?

## 2021-09-27 NOTE — Telephone Encounter (Signed)
Called patient to go over MDs reply. Went straight to [employer]. Left message to call back, but will also send MyChart message with MDs recommendations.  ?

## 2021-09-27 NOTE — Telephone Encounter (Signed)
Minna Merritts, MD   ? ?I read the note from pulmonary April 6  ?I do not see a discussion on the medication changes only that he may have made the changes  ?I do not want to speculate about the changes he made, she is welcome to try the medication changes he has suggested  ?Or she could call his office for further explanation  ?We do use the medication as he has prescribed for blood pressure  ?Perhaps he thought there was some concern they would affect her breathing, in particular the losartan  ?Best option would be to try the medications and monitor her blood pressure  ?Thx  ?TGollan   ? ?

## 2021-09-28 NOTE — Telephone Encounter (Signed)
Received MyChart from patient:  ? ?"I returned your call yesterday but you didn't call me back.  My BP this morning was 105/56." ? ? ?Called and spoke with patient. Apologized that I hadn't called her back, as I hadn't received a message that she had called yesterday.  ? ?Asked patient if she was having any dizziness associated with her BP this morning. Pt didn't say yes or no and just said that she was resting in bed. Stated that Dr. Rockey Situ has said that she doesn't want her SBP to be <110. ? ?Advised pt that I would forward this to Dr. Rockey Situ for review. Pt verbalized understanding and voiced appreciation for the call.  ? ? ? ?

## 2021-09-29 NOTE — Telephone Encounter (Signed)
Patricia Merritts, MD   ? ?Would get a few more measurements of blood pressure before making any changes  ?As changes were just recently made by pulmonary  ?TG   ? ?Called and spoke with patient.  ? ?Advised her that Dr. Rockey Situ would like more BP measurements prior to making any changes. Pt reports that she has BP readings since Monday, which was when she started taking the new medications.  ? ?Reports that her BP was 105/65 yesterday morning, then went up to 786 systolic, then was 75/44 and she didn't feel good all day yesterday.  ? ?Advised patient that she may want to reach out to Dr. Teodoro Kil office and let him know that she's not tolerating the changes, as it's not clear in his note why the changes where made and it could have had something to do with her lungs.  ? ?Pt verbalized understanding and voiced appreciation for the call.  ?

## 2021-10-09 ENCOUNTER — Ambulatory Visit: Payer: Medicare Other | Admitting: Surgery

## 2021-10-18 ENCOUNTER — Other Ambulatory Visit: Payer: Self-pay | Admitting: Cardiovascular Disease

## 2021-10-18 DIAGNOSIS — E7800 Pure hypercholesterolemia, unspecified: Secondary | ICD-10-CM

## 2021-11-17 ENCOUNTER — Telehealth: Payer: Self-pay | Admitting: Cardiovascular Disease

## 2021-11-17 NOTE — Telephone Encounter (Signed)
Called patient. No answer. Left message.   Will route message to pharmd to see if they have any insight.

## 2021-11-17 NOTE — Telephone Encounter (Signed)
Patient returned your call.

## 2021-11-17 NOTE — Telephone Encounter (Signed)
Pt c/o medication issue:  1. Name of Medication:  nebivolol (BYSTOLIC) 10 MG tablet  2. How are you currently taking this medication (dosage and times per day)?   3. Are you having a reaction (difficulty breathing--STAT)?   4. What is your medication issue?   Patient states [facility] is no longer providing Bystolic and she would like to know if Dr. Rockey Situ can recommend an alternative. Please advise.

## 2021-11-17 NOTE — Telephone Encounter (Signed)
Both name brand Bystolic and generic nebivolol are still being made.  May need to try a different pharmacy.

## 2021-11-21 NOTE — Telephone Encounter (Signed)
Called to speak with patient.   Patient reports that she was getting her Bystolic from [facility] for free, but they are no longer providing Bystolic.   She spoke with her insurance company and both the brand name and generic of Bystolic are tier 3 medications, and she cannot afford the co-pay.   She reports that she had her follow up CT yesterday and her BP when she arrived was 234/99 and on recheck was 245/112. While at the facility she took one of her 0.1 mg clonidine and prior to leaving it was 199/93, and at midnight it was 112/93.   Pt would like to know what Dr. Rockey Situ can recommend as an alternative to Bystolic.   Advised pt that I would forward to MD for review.   Pt verbalized understanding and voiced appreciation for call.

## 2021-11-27 MED ORDER — NEBIVOLOL HCL 10 MG PO TABS: 10.0000 mg | ORAL_TABLET | Freq: Two times a day (BID) | ORAL | 3 refills | Status: DC

## 2021-11-27 NOTE — Addendum Note (Signed)
Addended by: Mila Merry on: 11/27/2021 02:24 PM   Modules accepted: Orders

## 2021-11-27 NOTE — Telephone Encounter (Signed)
Minna Merritts, MD     she may want to look into good [email or web address] you can get 60 of the 10 mg generic bystolic at [facility] for little under $16  She would need to print a coupon  [facility] was $25  If this is not good for her let me know  Thx  TG    Called patient and went over recommendation.   Patient verbalized understanding. Stated that would work for her. Goodrx coupon for [facility] for 90 supply would be just under $28. Patient stated that would be affordable for her, and it's less than getting it month-to-month.   Pt verbalized appreciation for the help.   RX sent into [facility].

## 2021-11-30 ENCOUNTER — Other Ambulatory Visit: Payer: Self-pay | Admitting: Internal Medicine

## 2021-11-30 DIAGNOSIS — Z1231 Encounter for screening mammogram for malignant neoplasm of breast: Secondary | ICD-10-CM

## 2022-01-12 ENCOUNTER — Ambulatory Visit
Admission: RE | Admit: 2022-01-12 | Discharge: 2022-01-12 | Disposition: A | Discharge status: Home / Self Care | Payer: Medicare Other | Source: Ambulatory Visit | Attending: Internal Medicine | Admitting: Internal Medicine

## 2022-01-12 DIAGNOSIS — Z1231 Encounter for screening mammogram for malignant neoplasm of breast: Secondary | ICD-10-CM | POA: Diagnosis present

## 2022-02-16 ENCOUNTER — Emergency Department
Admission: EM | Admit: 2022-02-16 | Discharge: 2022-02-16 | Disposition: A | Discharge status: Home / Self Care | Payer: Medicare Other | Attending: Emergency Medicine | Admitting: Emergency Medicine

## 2022-02-16 ENCOUNTER — Emergency Department: Payer: Medicare Other

## 2022-02-16 ENCOUNTER — Other Ambulatory Visit: Payer: Self-pay

## 2022-02-16 DIAGNOSIS — S61210A Laceration without foreign body of right index finger without damage to nail, initial encounter: Secondary | ICD-10-CM | POA: Insufficient documentation

## 2022-02-16 DIAGNOSIS — S61216A Laceration without foreign body of right little finger without damage to nail, initial encounter: Secondary | ICD-10-CM | POA: Diagnosis not present

## 2022-02-16 DIAGNOSIS — S6991XA Unspecified injury of right wrist, hand and finger(s), initial encounter: Secondary | ICD-10-CM | POA: Diagnosis present

## 2022-02-16 DIAGNOSIS — W5501XA Bitten by cat, initial encounter: Secondary | ICD-10-CM | POA: Diagnosis not present

## 2022-02-16 HISTORY — DX: Aneurysm of the aortic arch, without rupture: I71.22

## 2022-02-16 MED ORDER — AMOXICILLIN-POT CLAVULANATE 875-125 MG PO TABS
1.0000 | ORAL_TABLET | Freq: Once | ORAL | Status: AC
  Administered 2022-02-16: 1 via ORAL
  Filled 2022-02-16: qty 1

## 2022-02-16 MED ORDER — TETANUS-DIPHTH-ACELL PERTUSSIS 5-2.5-18.5 LF-MCG/0.5 IM SUSY
0.5000 mL | PREFILLED_SYRINGE | Freq: Once | INTRAMUSCULAR | Status: DC
  Filled 2022-02-16: qty 0.5

## 2022-02-16 MED ORDER — AMOXICILLIN-POT CLAVULANATE 875-125 MG PO TABS
1.0000 | ORAL_TABLET | Freq: Two times a day (BID) | ORAL | 0 refills | Status: AC
Start: 2022-02-16 — End: 2022-02-26

## 2022-02-16 NOTE — ED Provider Notes (Signed)
[facility] Emergency Department Provider Note     Event Date/Time   First MD Initiated Contact with Patient 02/16/22 1657     (approximate)   History   Animal Bite   HPI  [name] is a 76 y.o. female presents to the ED for evaluation of unintentional cat bites to the right hand.  Patient presents with a bite to the right pinky as well as a dorsal index finger injury.  Notes the injury occurred after she attempted to feed and catch a feral cat she had contact with for several months.  She reports the cat did not show any signs of injury or illness.  The cat has been turned over to the patient's local vet for 10-day quarantine.  Patient reports that she is allergic to the tetanus toxoid vaccine.   Physical Exam   Triage Vital Signs: ED Triage Vitals [02/16/22 1619]  Enc Vitals Group     BP 116/82     Pulse Rate 63     Resp 18     Temp 98.7 F (37.1 C)     Temp Source Oral     SpO2 95 %     Weight 228 lb (103.4 kg)     Height      Head Circumference      Peak Flow      Pain Score 3     Pain Loc      Pain Edu?      Excl. in [place]?     Most recent vital signs: Vitals:   02/16/22 1619  BP: 116/82  Pulse: 63  Resp: 18  Temp: 98.7 F (37.1 C)  SpO2: 95%    General Awake, no distress.  CV:  Good peripheral perfusion.  RESP:  Normal effort.  ABD:  No distention.  MSK:  Normal composite fist on the right.  Patient with a laceration to the ulnar and radial aspects of the right pinky fat pad.  No nail injury is appreciated.  Superficial abrasions and lacerations to the right index finger noted.   ED Results / Procedures / Treatments   Labs (all labs ordered are listed, but only abnormal results are displayed) Labs Reviewed - No data to display   EKG    RADIOLOGY  I personally viewed and evaluated these images as part of my medical decision making, as well as reviewing the written report by the radiologist.  ED Provider  Interpretation: no acute findings  DG Right Hand  IMPRESSION: 1. No acute fracture or radiopaque foreign body. 2. Osteoarthritis as above, greatest within the fourth and fifth DIP joints and thumb carpometacarpal joint.  PROCEDURES:  Critical Care performed: No  Procedures  Wound care to the right hand including sterile nonstick bandage application  MEDICATIONS ORDERED IN ED: Medications  amoxicillin-clavulanate (AUGMENTIN) 875-125 MG per tablet 1 tablet (1 tablet Oral Given 02/16/22 1719)     IMPRESSION / MDM / ASSESSMENT AND PLAN / ED COURSE  I reviewed the triage vital signs and the nursing notes.                              Differential diagnosis includes, but is not limited to, puncture wound, retained foreign body, cat bite injury, tenosynovitis or cellulitis  Patient's presentation is most consistent with acute complicated illness / injury requiring diagnostic workup.  Patient's diagnosis is consistent with cat bite injury to the right  pinky and right index finger specifically.  Patient treated for her wound with First aid.  No radiologic evidence of any acute bony injury, retained foreign body, or subcutaneous air.  Patient will be discharged home with prescriptions for Augmentin. Patient is to follow up with primary provider for ongoing wound care management as needed or otherwise directed.  Patient is further advised to maintain contact with veterinary office for the disposition of the 10-day quarantine of the cat.  She will return to this ED to initiate the rabies vaccine if advised.  Patient is given ED precautions to return to the ED for any worsening or new symptoms.     FINAL CLINICAL IMPRESSION(S) / ED DIAGNOSES   Final diagnoses:  Cat bite, initial encounter     Rx / DC Orders   ED Discharge Orders          Ordered    amoxicillin-clavulanate (AUGMENTIN) 875-125 MG tablet  2 times daily        02/16/22 1801             Note:  This document was  prepared using Dragon voice recognition software and may include unintentional dictation errors.    Melvenia Needles, PA-C 02/17/22 2320    Blake Divine, MD 02/18/22 [id number]

## 2022-02-16 NOTE — ED Triage Notes (Signed)
Pt arrives with c/o cat bite on the pinky and index finger. Per pt, cat is [employer] account planner has filed a report.

## 2022-02-16 NOTE — Discharge Instructions (Addendum)
Keep the wound clean, dry, and covered. Take the antibiotic as directed. Follow-up with the veterinarian for quarantine status of the cat.

## 2022-02-26 ENCOUNTER — Other Ambulatory Visit: Payer: Self-pay | Admitting: Cardiovascular Disease

## 2022-03-27 ENCOUNTER — Ambulatory Visit
Admission: EM | Admit: 2022-03-27 | Discharge: 2022-03-27 | Disposition: A | Discharge status: Home / Self Care | Payer: Medicare Other | Attending: Emergency Medicine | Admitting: Emergency Medicine

## 2022-03-27 ENCOUNTER — Encounter: Payer: Self-pay | Admitting: Emergency Medicine

## 2022-03-27 DIAGNOSIS — N390 Urinary tract infection, site not specified: Secondary | ICD-10-CM | POA: Diagnosis present

## 2022-03-27 LAB — URINALYSIS, MICROSCOPIC (REFLEX): WBC, UA: >50 WBC/hpf (ref 0–5)

## 2022-03-27 LAB — URINALYSIS, ROUTINE W REFLEX MICROSCOPIC
Bilirubin Urine: NEGATIVE
Glucose, UA: NEGATIVE mg/dL
Ketones, ur: NEGATIVE mg/dL
Nitrite: NEGATIVE
Protein, ur: NEGATIVE mg/dL
Specific Gravity, Urine: 1.02 (ref 1.005–1.030)
pH: 5.5 (ref 5.0–8.0)

## 2022-03-27 MED ORDER — PHENAZOPYRIDINE HCL 200 MG PO TABS: 200.0000 mg | ORAL_TABLET | Freq: Three times a day (TID) | ORAL | 0 refills | Status: AC

## 2022-03-27 MED ORDER — NITROFURANTOIN MONOHYD MACRO 100 MG PO CAPS: 100.0000 mg | ORAL_CAPSULE | Freq: Two times a day (BID) | ORAL | 0 refills | Status: DC

## 2022-03-27 NOTE — ED Provider Notes (Signed)
[facility]    CSN: [id number] Arrival date & time: 03/27/22  1742      History   Chief Complaint Chief Complaint  Patient presents with   Dysuria    HPI Evangela Heffler is a 76 y.o. female.   HPI  76 year old female here for evaluation of urinary complaints.  Patient reports that she has been experiencing some discomfort with urination, suprapubic pain, and cloudiness to her urine for the last several days.  Does not associate with any fever, blood in her urine, urinary urgency or frequency, vaginal discharge, or vaginal itching.  She states she has a frequent history of urinary tract infections.  Past Medical History:  Diagnosis Date   Anterolisthesis    Cervical spine   Aortic arch aneurysm (HCC)    Asthma    Connective tissue disorder ([place])    recurrent carotid arteritis, temporal arteritis, vasculitis mandible, general hepatitis, avascular necrosis bilat   DDD (degenerative disc disease), lumbar    Diabetes mellitus without complication (HCC)    Diverticulosis    Duodenitis    Ganglion cyst of right foot    Gouty arthritis    Hiatal hernia with GERD    History of cardiovascular stress test    a. 07/2018 [facility]): Fixed inferoapical defect w/ nl contraction-->attenuation artifact. No ischemia. EF 63%.   Hyperlipidemia    Hypertension    a. 02/2019 Renal artery duplex: no evidence of prox RAS.   Hyperuricemia    Keratitis sicca, bilateral (HCC)    Lymphedema of both lower extremities    Osteoarthritis    Pericarditis    Recurrent: Aug 97, July 05, Sept 08, July 16, July 18   PUD (peptic ulcer disease)    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[place])    Sjogren's syndrome ([place])    Syncope    Tendonitis, Achilles, right    Tortuous colon    Trigger finger     Patient Active Problem List   Diagnosis Date Noted   Abdominal pain 07/05/2021   H/O total hip arthroplasty, right 07/12/2020   Status post left partial knee replacement 07/12/2020   Nausea  03/31/2020   Multiple thyroid nodules 12/28/2019   Thyroid nodule 09/29/2019   Lymphedema 11/10/2018   PAD (peripheral artery disease) ([place]) 10/12/2018   Aortic atherosclerosis ([place]) 10/12/2018   Carotid stenosis, asymptomatic, bilateral 10/12/2018   Positive colorectal cancer screening using Cologuard test 10/09/2018   Gastroesophageal reflux disease    Acute gastritis without hemorrhage    Osteoarthritis 04/29/2018   Hiatal hernia with GERD 04/29/2018   Hyperlipidemia 04/29/2018   Lymphedema of both lower extremities 04/29/2018   Left knee pain 10/23/2016   Cataracts, bilateral 07/31/2016   Glaucoma 07/31/2016   Lateral epicondylitis of left elbow 10/20/2015   Vitamin D deficiency 10/20/2015   Type 2 diabetes, controlled, with neuropathy ([place]) 10/16/2015   Type II diabetes mellitus with complication ([place]) 09/32/3557   Gouty arthritis 06/03/2015   H/O syncope 06/03/2015   Mild intermittent asthma 06/03/2015   Multiple gastric ulcers 06/03/2015   Schatzki's ring 06/03/2015   Undifferentiated connective tissue disease ([place]) 06/03/2015   Bone disorder 04/05/2015   Dental injury 04/05/2015   Pericarditis 04/05/2015   Sjogren's syndrome ([place]) 09/07/2014   Elevated alkaline phosphatase level 08/24/2014   Lactose intolerance 08/24/2014   Obesity 08/24/2014   Peripheral edema 08/24/2014   Benign hypertension with CKD (chronic kidney disease) stage III 08/03/2014   Abdominal adhesions 08/02/2014   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[place]) 08/02/2014   Degenerative joint disease (DJD) of lumbar spine 08/02/2014   Diverticulosis of both small and large intestine 08/02/2014   Hyperuricemia 08/02/2014   Pure hypercholesterolemia 08/02/2014   Trigger finger 08/02/2014   Right shoulder pain 07/29/2014   Benign neoplasm of colon 06/30/2010   Right upper quadrant pain 06/30/2010    Past Surgical History:  Procedure Laterality Date   ABDOMINAL HYSTERECTOMY     BREAST BIOPSY      BREAST EXCISIONAL BIOPSY Left 1986   neg   CHOLECYSTECTOMY     COLONOSCOPY WITH PROPOFOL N/A 08/26/2018   Procedure: COLONOSCOPY WITH PROPOFOL;  Surgeon: Lucilla Lame, MD;  Location: [facility] ENDOSCOPY;  Service: Endoscopy;  Laterality: N/A;   CYSTOSCOPY  02/26/2018   [name], MD    distal arthrectomy     ESOPHAGOGASTRODUODENOSCOPY (EGD) WITH PROPOFOL N/A 08/26/2018   Procedure: ESOPHAGOGASTRODUODENOSCOPY (EGD) WITH PROPOFOL;  Surgeon: Lucilla Lame, MD;  Location: [facility] ENDOSCOPY;  Service: Endoscopy;  Laterality: N/A;   EXCISION NEUROMA     KNEE SURGERY Bilateral    1985, 2001, 11/2002, 12/2006   panhysterectomy  10/1983   SHOULDER SURGERY Right    reverse total arthroplasty w/ biceps tenodesis   TONSILLECTOMY AND ADENOIDECTOMY     TOTAL HIP ARTHROPLASTY Right 04/2007   WISDOM TOOTH EXTRACTION      OB History   No obstetric history on file.      Home Medications    Prior to Admission medications   Medication Sig Start Date End Date Taking? Authorizing Provider  nitrofurantoin, macrocrystal-monohydrate, (MACROBID) 100 MG capsule Take 1 capsule (100 mg total) by mouth 2 (two) times daily. 03/27/22  Yes Margarette Canada, NP  phenazopyridine (PYRIDIUM) 200 MG tablet Take 1 tablet (200 mg total) by mouth 3 (three) times daily. 03/27/22  Yes Margarette Canada, NP  acetaminophen (TYLENOL) 500 MG tablet Take 500 mg by mouth daily as needed.    [provider]  albuterol (VENTOLIN HFA) 108 (90 Base) MCG/ACT inhaler Inhale 1-2 puffs into the lungs every 4 (four) hours as needed for wheezing or shortness of breath. 06/03/21   Danton Clap, PA-C  ARTIFICIAL TEAR OP Apply to eye as needed.     [provider]  BD INSULIN SYRINGE U/F 31G X 5/16" 0.5 ML MISC USE AS DIRECTED 08/30/20   Idelle Crouch, MD  BD INSULIN SYRINGE U/F 31G X 5/16" 1 ML MISC USE AS DIRECTED. WITH HUMALOG 07/28/18   Mikey College, NP  BIOTIN PO Take 1 mg by mouth daily.     [provider]   budesonide-formoterol (SYMBICORT) 80-4.5 MCG/ACT inhaler Inhale 2 puffs into the lungs 2 (two) times daily. 06/09/21   Rodriguez-Southworth, Sunday Spillers, PA-C  Cholecalciferol (VITAMIN D3) 25 MCG (1000 UT) CAPS Take 4 capsules by mouth daily.     [provider]  clindamycin (CLEOCIN) 300 MG capsule Take 600 mg by mouth. 1 Hour before dental procedures    [provider]  cloNIDine (CATAPRES - DOSED IN MG/24 HR) 0.1 mg/24hr patch Place 0.1 mg onto the skin once a week.    Idelle Crouch, MD  cloNIDine (CATAPRES) 0.1 MG tablet Take 1 tablet (0.1 mg total) by mouth 3 (three) times daily as needed. Take 1 tablet (0.1 mg) by mouth three times daily 05/08/21   Minna Merritts, MD  COLCRYS 0.6 MG tablet Take 1 tablet (0.6 mg total) by mouth 2 (two) times daily as needed. For up to 3 days  for pericarditis, or up to 7 days for gout or connective tissue disorder. 01/14/20   Karamalegos, Devonne Doughty, DO  cyclobenzaprine (FLEXERIL) 5 MG tablet Take 1 tablet (5 mg total) by mouth daily as needed for muscle spasms. Patient taking differently: Take 5 mg by mouth at bedtime. 05/26/20   Malfi, [name], FNP  ezetimibe (ZETIA) 10 MG tablet TAKE 1 TABLET BY MOUTH DAILY 02/26/22   Minna Merritts, MD  famotidine (PEPCID) 40 MG tablet Take 40 mg by mouth 2 (two) times daily.    [provider]  FREESTYLE LITE test strip USE AS DIRECTED THREE TIMES DAILY FOR DIABETES 03/03/20   Verl Bangs, FNP  insulin degludec (TRESIBA FLEXTOUCH) 100 UNIT/ML FlexTouch Pen Inject 72 Units into the skin daily.    [provider]  insulin lispro (HUMALOG) 100 UNIT/ML injection Correction scale: take 1 unit per 50 over 150 before meals up to three times daily.  Up to 20 units per day. 02/12/19   Mikey College, NP  Insulin Pen Needle (PEN NEEDLES) 32G X 5 MM MISC 1 Device by Does not apply route daily. 08/26/19   Malfi, [name], FNP  lactase (LACTAID) 3000 units tablet Take by mouth as needed.      [provider]  Lancets (FREESTYLE) lancets Must test x4/day 06/19/16   [provider]  losartan (COZAAR) 50 MG tablet Take 75 mg by mouth daily.    [provider]  nebivolol (BYSTOLIC) 10 MG tablet Take 1 tablet (10 mg total) by mouth 2 (two) times daily. 11/27/21   Minna Merritts, MD  ondansetron (ZOFRAN-ODT) 4 MG disintegrating tablet Take 1 tablet (4 mg total) by mouth every 8 (eight) hours as needed for nausea or vomiting. 03/29/20   Malfi, [name], FNP  rosuvastatin (CRESTOR) 10 MG tablet TAKE 1 TABLET BY MOUTH DAILY 10/19/21   Minna Merritts, MD    Family History Family History  Problem Relation Age of Onset   Hypertension Mother    Diabetes Mother    Hyperlipidemia Mother    Congestive Heart Failure Mother    Cancer Mother    Non-Hodgkin's lymphoma Sister    Cancer Sister    Diabetes Maternal Grandmother    Breast cancer Neg Hx     Social History Social History   Tobacco Use   Smoking status: Former    Types: Cigarettes    Quit date: 01/22/1996    Years since quitting: 26.1   Smokeless tobacco: Never  Vaping Use   Vaping Use: Never used  Substance Use Topics   Alcohol use: Yes    Comment: occasional-once every 6 months   Drug use: Never     Allergies   Amlodipine, Aspirin, Bee venom, Ciprofloxacin, Codeine, Eggs or egg-derived products, Erythromycin, Glimepiride, Hydralazine, Hydrochlorothiazide, Hydrocodone, Hydrocodone-acetaminophen, Hydromorphone, Irbesartan, Irbesartan-hydrochlorothiazide, Lisinopril, Maxitrol [neomycin-polymyxin-dexameth], Metformin and related, Metformin hcl, Methylcellulose, Metoclopramide, Metoprolol, Minoxidil, Neomycin-bacitracin zn-polymyx, Norvasc [amlodipine besylate], Nsaids, Omeprazole magnesium, Other, Oxycodone-acetaminophen, Penicillins, Pioglitazone, Poison sumac extract, Prilosec [omeprazole], Silver sulfadiazine, Spironolactone, Sulfa antibiotics, Tape, Tetanus toxoid, Tramadol, and Betadine  [povidone iodine]   Review of Systems Review of Systems  Constitutional:  Negative for fever.  Gastrointestinal:  Positive for abdominal pain.  Genitourinary:  Positive for dysuria. Negative for frequency, hematuria and urgency.  Musculoskeletal:  Negative for back pain.     Physical Exam Triage Vital Signs ED Triage Vitals  Enc Vitals Group     BP 03/27/22 1813 (!) 159/85  Pulse Rate 03/27/22 1813 65     Resp 03/27/22 1813 16     Temp 03/27/22 1813 98.7 F (37.1 C)     Temp Source 03/27/22 1813 Oral     SpO2 03/27/22 1813 97 %     Weight --      Height --      Head Circumference --      Peak Flow --      Pain Score 03/27/22 1811 0     Pain Loc --      Pain Edu? --      Excl. in [place]? --    No data found.  Updated Vital Signs BP (!) 159/85 (BP Location: Left Arm)   Pulse 65   Temp 98.7 F (37.1 C) (Oral)   Resp 16   SpO2 97%   Visual Acuity Right Eye Distance:   Left Eye Distance:   Bilateral Distance:    Right Eye Near:   Left Eye Near:    Bilateral Near:     Physical Exam Vitals and nursing note reviewed.  Constitutional:      Appearance: Normal appearance. She is not ill-appearing.  HENT:     Head: Normocephalic and atraumatic.  Cardiovascular:     Rate and Rhythm: Normal rate and regular rhythm.     Pulses: Normal pulses.     Heart sounds: Normal heart sounds. No murmur heard.    No friction rub. No gallop.  Pulmonary:     Effort: Pulmonary effort is normal.     Breath sounds: Normal breath sounds. No wheezing, rhonchi or rales.  Abdominal:     Palpations: Abdomen is soft.     Tenderness: There is no abdominal tenderness. There is no right CVA tenderness or left CVA tenderness.  Skin:    General: Skin is warm and dry.     Capillary Refill: Capillary refill takes less than 2 seconds.  Neurological:     General: No focal deficit present.     Mental Status: She is alert and oriented to person, place, and time.  Psychiatric:        Mood and  Affect: Mood normal.        Behavior: Behavior normal.        Thought Content: Thought content normal.        Judgment: Judgment normal.      UC Treatments / Results  Labs (all labs ordered are listed, but only abnormal results are displayed) Labs Reviewed  URINALYSIS, ROUTINE W REFLEX MICROSCOPIC - Abnormal; Notable for the following components:      Result Value   APPearance HAZY (*)    Hgb urine dipstick MODERATE (*)    Leukocytes,Ua LARGE (*)    All other components within normal limits  URINALYSIS, MICROSCOPIC (REFLEX) - Abnormal; Notable for the following components:   Bacteria, UA FEW (*)    All other components within normal limits  URINE CULTURE    EKG   Radiology No results found.  Procedures Procedures (including critical care time)  Medications Ordered in UC Medications - No data to display  Initial Impression / Assessment and Plan / UC Course  I have reviewed the triage vital signs and the nursing notes.  Pertinent labs & imaging results that were available during my care of the patient were reviewed by me and considered in my medical decision making (see chart for details).   Patient is a nontoxic-appearing 76 year old female here for evaluation of dysuria with cloudy urine  and suprapubic pain has been going for last several days.  She denies any urgency or frequency but states she has had an increase in incontinence.  Patient has no CVA tenderness on exam and her abdomen is soft and nontender.  I will order urinalysis to look for the presence of UTI.  Urinalysis shows hazy appearance with moderate hemoglobin and large leukocyte esterase but no nitrites or protein.  Reflex microscopy shows greater than 50 WBCs and few bacteria.  Also WBC clumps.  I will send urine for culture.  I will discharge patient home on Macrobid twice daily for 5 days for treatment of UTI as well as Pyridium to help with the urinary discomfort.   Final Clinical Impressions(s) / UC  Diagnoses   Final diagnoses:  Lower urinary tract infectious disease     Discharge Instructions      Take the Macrobid twice daily for 5 days with food for treatment of urinary tract infection.  Use the Pyridium every 8 hours as needed for urinary discomfort.  This will turn your urine a bright red-orange.  Increase your oral fluid intake so that you increase your urine production and or flushing your urinary system.  Take an over-the-counter probiotic, such as Culturelle-Align-Activia, 1 hour after each dose of antibiotic to prevent diarrhea or yeast infections from forming.  We will culture urine and change the antibiotics if necessary.  Return for reevaluation, or see your primary care provider, for any new or worsening symptoms.      ED Prescriptions     Medication Sig Dispense Auth. Provider   nitrofurantoin, macrocrystal-monohydrate, (MACROBID) 100 MG capsule Take 1 capsule (100 mg total) by mouth 2 (two) times daily. 10 capsule Margarette Canada, NP   phenazopyridine (PYRIDIUM) 200 MG tablet Take 1 tablet (200 mg total) by mouth 3 (three) times daily. 6 tablet Margarette Canada, NP      PDMP not reviewed this encounter.   Margarette Canada, NP 03/27/22 1857

## 2022-03-27 NOTE — Discharge Instructions (Signed)

## 2022-03-27 NOTE — ED Triage Notes (Signed)
Pt presents with dysuria and lower abdominal pain for several days

## 2022-03-29 LAB — URINE CULTURE: Culture: 20000 — AB

## 2022-04-06 IMAGING — CR DG CHEST 2V
2 series · 2 of 2 positions shown · non-contrast
Comparison: None.

CLINICAL DATA: Cough for 7 weeks.

EXAM:
CHEST - 2 VIEW

[chest pa]
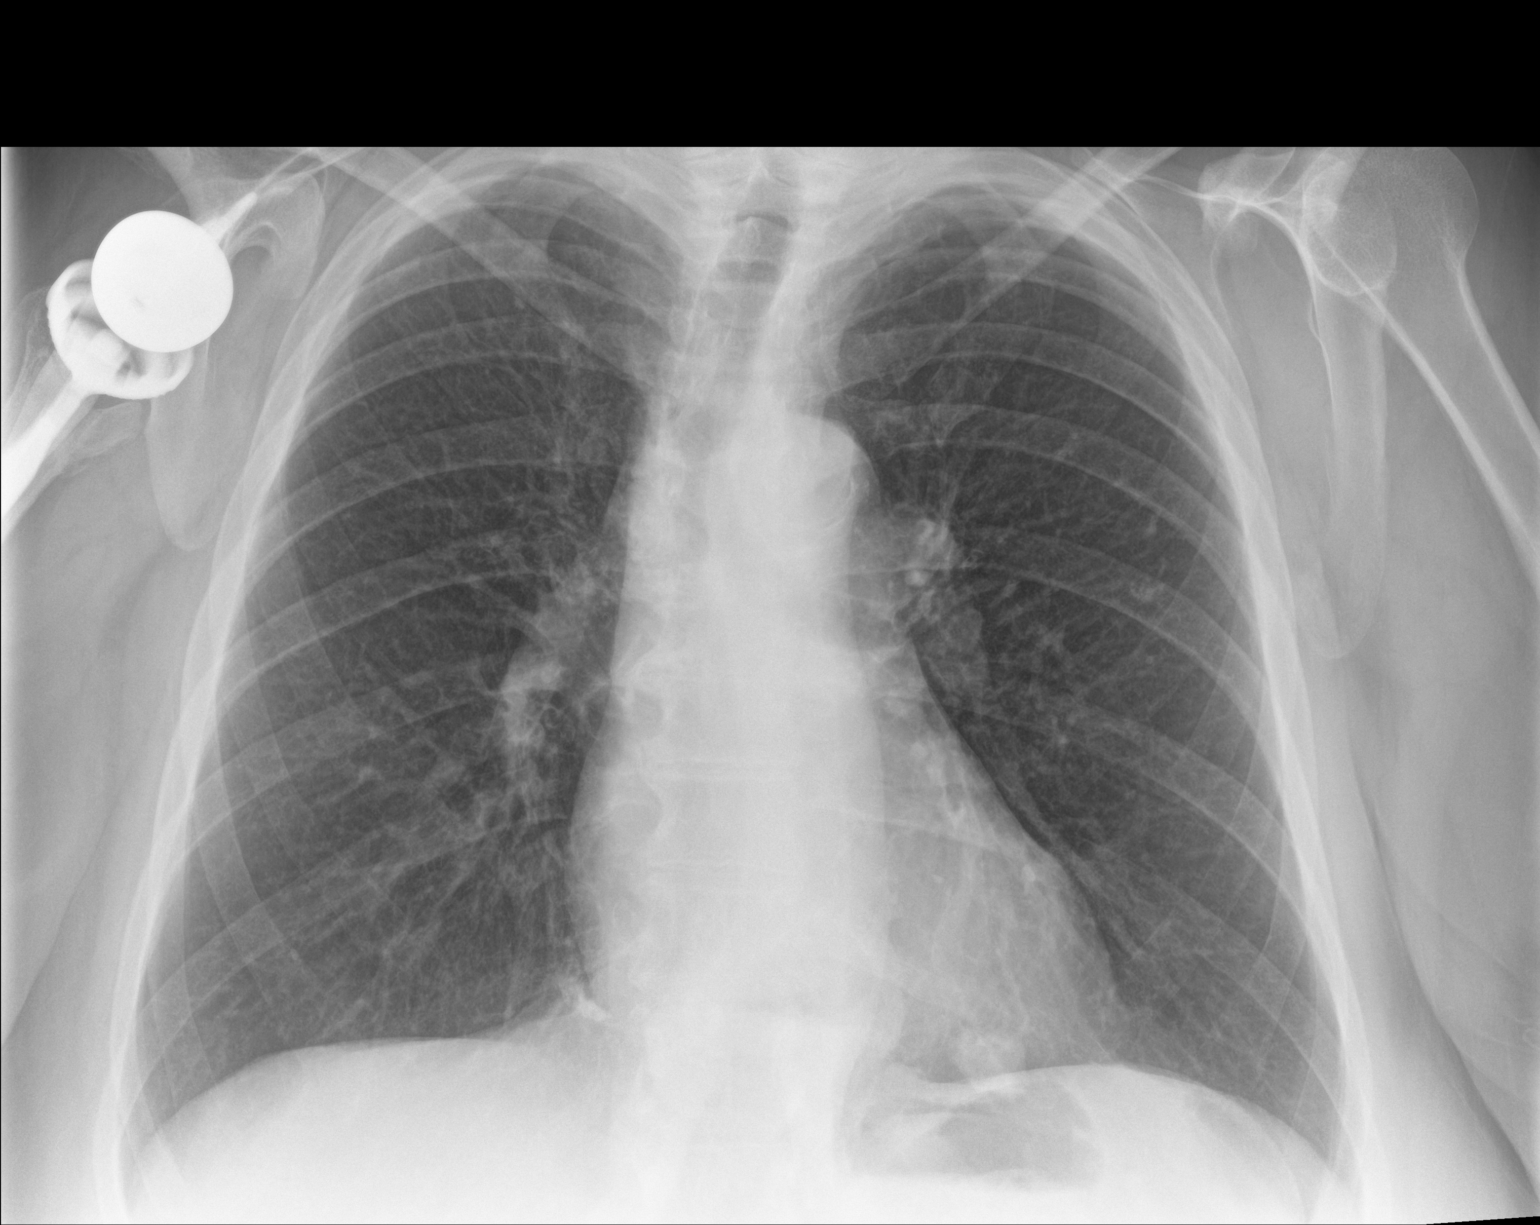

[chest lat]
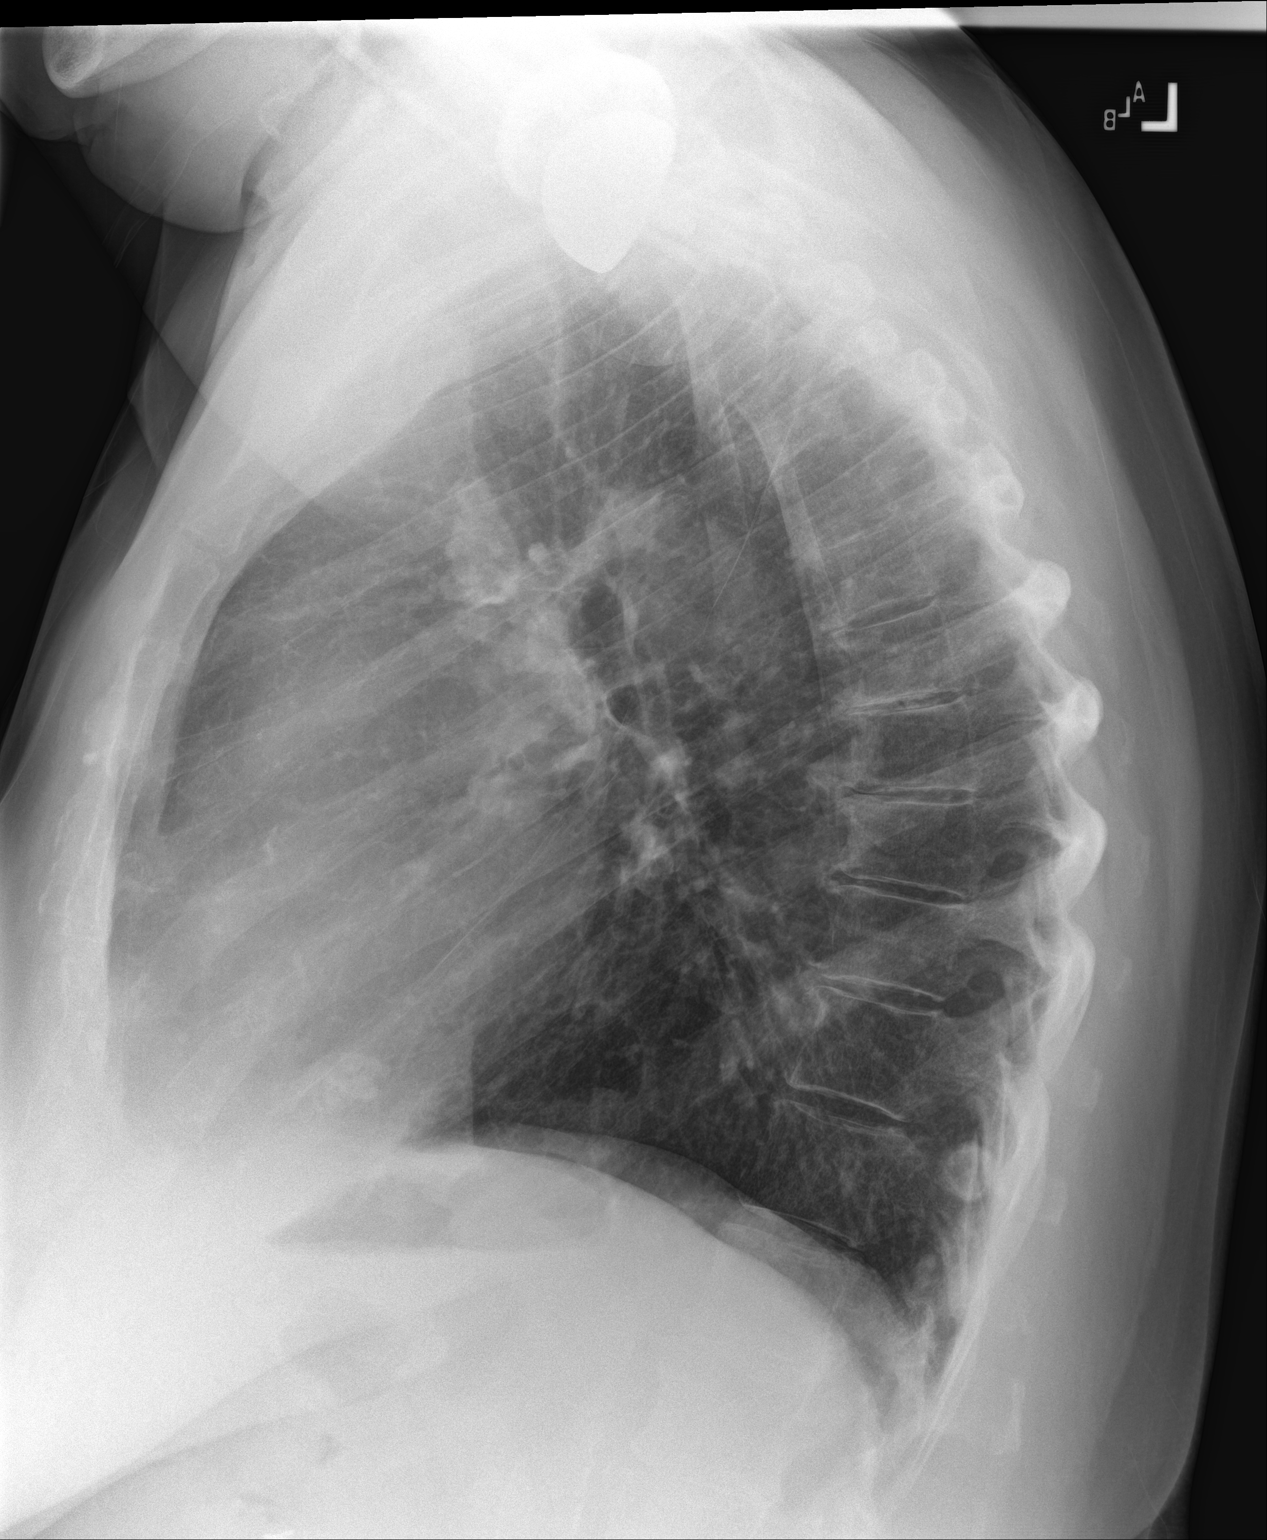

[2 of 2 positions shown; findings below may reference images not displayed]

FINDINGS: Heart size is normal. Changes of COPD present. No focal airspace
disease is present. No edema or effusion is present. Mild
degenerative changes are present in the thoracic spine. Right
shoulder arthroplasty noted.
IMPRESSION: 1. COPD.
2. No acute cardiopulmonary disease.

## 2022-04-09 ENCOUNTER — Encounter (INDEPENDENT_AMBULATORY_CARE_PROVIDER_SITE_OTHER): Payer: Self-pay

## 2022-06-27 ENCOUNTER — Other Ambulatory Visit: Payer: Self-pay | Admitting: Cardiovascular Disease

## 2022-06-27 DIAGNOSIS — E7800 Pure hypercholesterolemia, unspecified: Secondary | ICD-10-CM

## 2022-08-29 ENCOUNTER — Other Ambulatory Visit: Payer: Self-pay | Admitting: Cardiovascular Disease

## 2022-08-29 DIAGNOSIS — E7800 Pure hypercholesterolemia, unspecified: Secondary | ICD-10-CM

## 2022-09-02 NOTE — Progress Notes (Unsigned)
Cardiology Office Note  Date:  09/03/2022   ID:  Shawnice, Ganis [birth year], MRN [id number]  PCP:  Idelle Crouch, MD   Chief Complaint  Patient presents with   12 month follow up     "Doing well." Medications reviewed by the patient verbally.     HPI:  Ms. Mariaelena Kassman is a 77 year old woman with past medical history of Former smoker  PAD/carotid HTN urinary incontinence, chronic UTIs Morbid obesity Moderate to large hiatal hernia. bleeding ulcers x 3, Aortic atherosclerosis on CT scan Syncope CT scan: small peripherally calcified focal outpouching,  most suggestive of a small saccular aneurysm, or less favored, a  penetrating aortic ulcer.   Hiatal hernia cough with recurrent bronchitis. 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  Presenting for  resistant hypertension, PAD, venous insuff, hiatal hernia, penetrating ulcer of aortic arch  Last seen by myself in clinic March 2023 In general reports she has been doing well Multiple issues to discuss on today's visit Followed by pulmonary, reports no significant change to her breathing Chronic arthritides Blood pressure running high 1 [id number] 70 systolic Could not afford clonidine patch now taking clonidine 0.1 twice daily Took her first pill of the day today at 2:30 PM, takes another 1 at midnight Also takes Bystolic 10 twice daily  Seen by pulmonary 6/23, aleskerov  Leg swelling stable, has lymphedema compression pumps  Recent imaging studies reviewed CT chest 6/23 1. 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  Unchanged small partially calcified focal outpouching measuring 2.3 x 0.7 cm (series 9 image 132), previously 2.2 x 0.7 cm, with a total aortic diameter  measuring  up to 3.4 cm.  2.  Stable mild thickening of the mitral valve leaflets.   Echocardiogram 12/22 Normal LV function and RV function Normal pressures, no significant valve disease  Carotid: April 2023 40 to 59% disease/stenosis on the right Minimal on the left  Labs reviewed Total chol 159, LDL 61 HGBA1C 8.8  Carotid u/s Right Carotid: 40-59% stenosis.  Mild on left  Intolerance to Lasix  EKG personally reviewed by myself on todays visit NSr rate 65 bpm no ST or T wave changes  Other past medical hx reviewed Oct 16th 2022, Sunday afternoon, BP 140s/80s Had shampoos hair,felt weird Woke up on shower floor EMT called [place] 126/80 per EMTs  Zio  reviewed Patient had a min HR of 47 bpm, max HR of 154 bpm, and avg HR of 64 bpm.  Sinus Rhythm.  7 Supraventricular Tachycardia runs occurred, the run with the fastest interval lasting 18 beats with a max rate of 154 bpm, the  longest lasting 17 beats with an avg rate of 107 bpm. Isolated SVEs were rare (<1.0%), SVE Couplets were rare (<1.0%), and SVE Triplets were rare (<1.0%). Isolated VEs were rare (<1.0%, 275), VE Triplets were rare (<1.0%, 2), and no VE Couplets were  present.   Seen in the hospital September 10, 2019 at [place] [facility] Mild atherosclerosis in the thoracic aorta. At the lateral aspect of the  aortic arch, there is a small peripherally calcified focal outpouching,  most suggestive of a small saccular aneurysm, or less favored, a  penetrating aortic ulcer.    Intolerance of HCTZ, sulfa Unable to  tolerate calcium channel blocker secondary to leg swelling and lymphedema   long history of pericarditis, pericardial effusion Previously managed by [facility] being treated with colchicine in the past, Also previously treated with long course of steroids for unspecified connective tissue disorder  CT scan 06/2018 Mild to moderate diffuse descending aortic atherosclerosis extending into the  common iliac arteries Coronary calcification in the right coronary artery  Carotid u/s 05/2018 Less than 50% stenosis in the right and left internal carotid Arteries.   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  PSH:    Past Surgical History:  Procedure Laterality Date   ABDOMINAL HYSTERECTOMY     BREAST BIOPSY     BREAST EXCISIONAL BIOPSY Left 1986   neg   CHOLECYSTECTOMY     COLONOSCOPY WITH PROPOFOL N/A 08/26/2018   Procedure: COLONOSCOPY WITH PROPOFOL;  Surgeon: Lucilla Lame, MD;  Location: [facility] ENDOSCOPY;  Service: Endoscopy;  Laterality: N/A;   CYSTOSCOPY  02/26/2018   [name], MD    distal arthrectomy     ESOPHAGOGASTRODUODENOSCOPY (EGD) WITH PROPOFOL N/A 08/26/2018   Procedure: ESOPHAGOGASTRODUODENOSCOPY (EGD) WITH PROPOFOL;  Surgeon: Lucilla Lame, MD;  Location[facility] ENDOSCOPY;  Service: Endoscopy;  Laterality: N/A;   EXCISION NEUROMA     KNEE SURGERY Bilateral    1985, 2001, 11/2002, 12/2006   panhysterectomy  10/1983   SHOULDER SURGERY Right    reverse total arthroplasty w/ biceps tenodesis   TONSILLECTOMY AND ADENOIDECTOMY     TOTAL HIP ARTHROPLASTY Right 04/2007   WISDOM TOOTH EXTRACTION      Current Outpatient Medications  Medication Sig Dispense Refill   acetaminophen (TYLENOL) 500 MG tablet Take 500 mg by mouth daily as needed.     albuterol (VENTOLIN HFA) 108 (90  Base) MCG/ACT inhaler Inhale 1-2 puffs into the lungs every 4 (four) hours as needed for wheezing or shortness of breath. 1 g 0   ARTIFICIAL TEAR OP Apply to eye as needed.      Cholecalciferol (VITAMIN D3) 25 MCG (1000 UT) CAPS Take 4 capsules by mouth daily.      cloNIDine (CATAPRES) 0.1 MG tablet Take 1 tablet (0.1 mg total) by mouth 3 (three) times daily as needed. Take 1 tablet (0.1 mg) by mouth three times daily 270 tablet 3   COLCRYS 0.6 MG tablet Take 1 tablet (0.6 mg total) by mouth 2 (two) times daily as needed. For up to 3 days for pericarditis, or up to 7 days for gout or connective tissue disorder. 60 tablet 2   cyclobenzaprine (FLEXERIL) 5 MG tablet Take 1 tablet (5 mg total) by mouth daily as needed for muscle spasms. 30 tablet 2   famotidine (PEPCID) 40 MG tablet Take 40 mg by mouth 2 (two) times daily.     FREESTYLE LITE test strip USE AS DIRECTED THREE TIMES DAILY FOR DIABETES 100 strip 4   insulin degludec (TRESIBA FLEXTOUCH) 100 UNIT/ML FlexTouch Pen Inject 72 Units into the skin daily.     insulin lispro (HUMALOG) 100 UNIT/ML injection Correction scale: take 1 unit per 50 over  150 before meals up to three times daily.  Up to 20 units per day. 20 mL 1   lactase (LACTAID) 3000 units tablet Take by mouth as needed.      nebivolol (BYSTOLIC) 10 MG tablet Take 1 tablet (10 mg total) by mouth 2 (two) times daily. 180 tablet 3   rosuvastatin (CRESTOR) 10 MG tablet TAKE 1 TABLET BY MOUTH DAILY 90 tablet 0   BD INSULIN SYRINGE U/F 31G X 5/16" 0.5 ML MISC USE AS DIRECTED (Patient not taking: Reported on 09/03/2022) 100 each 2   BD INSULIN SYRINGE U/F 31G X 5/16" 1 ML MISC USE AS DIRECTED. WITH HUMALOG (Patient not taking: Reported on 09/03/2022) 100 each 5   BIOTIN PO Take 1 mg by mouth daily.  (Patient not taking: Reported on 09/03/2022)     budesonide-formoterol (SYMBICORT) 80-4.5 MCG/ACT inhaler Inhale 2 puffs into the lungs 2 (two) times daily. (Patient not taking: Reported on 09/03/2022)  1 each 0   clindamycin (CLEOCIN) 300 MG capsule Take 600 mg by mouth. 1 Hour before dental procedures (Patient not taking: Reported on 09/03/2022)     cloNIDine (CATAPRES - DOSED IN MG/24 HR) 0.1 mg/24hr patch Place 0.1 mg onto the skin once a week. (Patient not taking: Reported on 09/03/2022)     ezetimibe (ZETIA) 10 MG tablet TAKE 1 TABLET BY MOUTH DAILY (Patient not taking: Reported on 09/03/2022) 90 tablet 1   Insulin Pen Needle (PEN NEEDLES) 32G X 5 MM MISC 1 Device by Does not apply route daily. (Patient not taking: Reported on 09/03/2022) 100 each 3   Lancets (FREESTYLE) lancets Must test x4/day (Patient not taking: Reported on 09/03/2022)     nitrofurantoin, macrocrystal-monohydrate, (MACROBID) 100 MG capsule Take 1 capsule (100 mg total) by mouth 2 (two) times daily. (Patient not taking: Reported on 09/03/2022) 10 capsule 0   ondansetron (ZOFRAN-ODT) 4 MG disintegrating tablet Take 1 tablet (4 mg total) by mouth every 8 (eight) hours as needed for nausea or vomiting. (Patient not taking: Reported on 09/03/2022) 30 tablet 0   phenazopyridine (PYRIDIUM) 200 MG tablet Take 1 tablet (200 mg total) by mouth 3 (three) times daily. (Patient not taking: Reported on 09/03/2022) 6 tablet 0   No current facility-administered medications for this visit.    Allergies:   Amlodipine, Aspirin, Bee venom, Ciprofloxacin, Codeine, Egg-derived products, Erythromycin, Glimepiride, Hydralazine, Hydrochlorothiazide, Hydrocodone, Hydrocodone-acetaminophen, Hydromorphone, Irbesartan, Irbesartan-hydrochlorothiazide, Lisinopril, Maxitrol [neomycin-polymyxin-dexameth], Metformin and related, Metformin hcl, Methylcellulose, Metoclopramide, Metoprolol, Minoxidil, Neomycin-bacitracin zn-polymyx, Norvasc [amlodipine besylate], Nsaids, Omeprazole magnesium, Other, Oxycodone-acetaminophen, Penicillins, Pioglitazone, Poison sumac extract, Prilosec [omeprazole], Silver sulfadiazine, Spironolactone, Sulfa antibiotics, Tape, Tetanus  toxoid, Tramadol, and Betadine [povidone iodine]   Social History:  The patient  reports that she quit smoking about 26 years ago. Her smoking use included cigarettes. She has never used smokeless tobacco. She reports current alcohol use. She reports that she does not use drugs.   Family History:   family history includes Cancer in her mother and sister; Congestive Heart Failure in her mother; Diabetes in her maternal grandmother and mother; Hyperlipidemia in her mother; Hypertension in her mother; Non-Hodgkin's lymphoma in her sister.    Review of Systems: Review of Systems  Constitutional: Negative.   HENT: Negative.    Respiratory: Negative.    Cardiovascular: Negative.   Gastrointestinal: Negative.   Musculoskeletal: Negative.   Neurological: Negative.   Psychiatric/Behavioral: Negative.    All other systems reviewed and are negative.   PHYSICAL EXAM: VS:  BP (!) 162/80 (BP Location:  Left Arm, Patient Position: Sitting, Cuff Size: Large)   Pulse 65   Ht 5\' 9"  (1.753 m)   Wt 232 lb (105.2 kg)   SpO2 98%   BMI 34.26 kg/m  , BMI Body mass index is 34.26 kg/m.  Constitutional:  oriented to person, place, and time. No distress.  HENT:  Head: Grossly normal Eyes:  no discharge. No scleral icterus.  Neck: No JVD, no carotid bruits  Cardiovascular: Regular rate and rhythm, no murmurs appreciated Pulmonary/Chest: Clear to auscultation bilaterally, no wheezes or rails Abdominal: Soft.  no distension.  no tenderness.  Musculoskeletal: Normal range of motion Neurological:  normal muscle tone. Coordination normal. No atrophy Skin: Skin warm and dry Psychiatric: normal affect, pleasant  Recent Labs: No results found for requested labs within last 365 days.    Lipid Panel Lab Results  Component Value Date   CHOL 256 (H) 07/06/2020   HDL 46 07/06/2020   LDLCALC 166 (H) 07/06/2020   TRIG 220 (H) 07/06/2020    Wt Readings from Last 3 Encounters:  09/03/22 232 lb (105.2 kg)   02/16/22 228 lb (103.4 kg)  09/01/21 228 lb (103.4 kg)      ASSESSMENT AND PLAN:  Syncope No further epsiodes Head MRI complete Echo  complete Carotid completed Possible vagal epsiodes, Blood pressure running high not low  Essential hypertension Numerous medication intolerances Recommend she continue to work with clonidine, increase dosing up to 0.1 3 times daily, stay hydrated to avoid vagal events  Aortic atherosclerosis (HCC) On Crestor and Zetia Numbers at goal  PAD (peripheral artery disease) (HCC) aortic atherosclerosis, carotid calcification Carotid u/s stable ,   Pure hypercholesterolemia Continue Crestor Zetia, stable  Lymphedema - Using her compression pumps for 1 hour daily Chronic mild lower extremity edema Followed by OT, wears compression hose  Hiatal hernia Has been seen by surgery at [facility], she is not inclined to proceed with any surgery at this time  Diabetes type 2 A1c elevated 8.8 Low carbohydrate diet recommended Recommend she work closely with primary care   Total encounter time more than 30 minutes  Greater than 50% was spent in counseling and coordination of care with the patient   Orders Placed This Encounter  Procedures   EKG 12-Lead     Signed, Esmond Plants, M.D., Ph.D. 09/03/2022  [place], [place]

## 2022-09-03 ENCOUNTER — Encounter: Payer: Self-pay | Admitting: Cardiovascular Disease

## 2022-09-03 ENCOUNTER — Ambulatory Visit: Payer: Medicare Other | Attending: Cardiovascular Disease | Admitting: Cardiovascular Disease

## 2022-09-03 VITALS — BP 160/80 | HR 65 | Ht 69.0 in | Wt 232.0 lb

## 2022-09-03 DIAGNOSIS — I10 Essential (primary) hypertension: Secondary | ICD-10-CM | POA: Diagnosis present

## 2022-09-03 DIAGNOSIS — I890 Lymphedema, not elsewhere classified: Secondary | ICD-10-CM | POA: Diagnosis present

## 2022-09-03 DIAGNOSIS — I7122 Aneurysm of the aortic arch, without rupture: Secondary | ICD-10-CM | POA: Insufficient documentation

## 2022-09-03 DIAGNOSIS — I739 Peripheral vascular disease, unspecified: Secondary | ICD-10-CM | POA: Insufficient documentation

## 2022-09-03 DIAGNOSIS — I6523 Occlusion and stenosis of bilateral carotid arteries: Secondary | ICD-10-CM | POA: Diagnosis not present

## 2022-09-03 DIAGNOSIS — E782 Mixed hyperlipidemia: Secondary | ICD-10-CM | POA: Diagnosis not present

## 2022-09-03 DIAGNOSIS — R55 Syncope and collapse: Secondary | ICD-10-CM | POA: Diagnosis present

## 2022-09-03 DIAGNOSIS — I700 Atherosclerosis of aorta: Secondary | ICD-10-CM | POA: Insufficient documentation

## 2022-09-03 DIAGNOSIS — E7800 Pure hypercholesterolemia, unspecified: Secondary | ICD-10-CM | POA: Insufficient documentation

## 2022-09-03 MED ORDER — CLONIDINE HCL 0.1 MG PO TABS: 0.1000 mg | ORAL_TABLET | Freq: Three times a day (TID) | ORAL | 3 refills | Status: DC

## 2022-09-03 MED ORDER — NEBIVOLOL HCL 10 MG PO TABS: 10.0000 mg | ORAL_TABLET | Freq: Two times a day (BID) | ORAL | 3 refills | Status: DC

## 2022-09-03 MED ORDER — ROSUVASTATIN CALCIUM 10 MG PO TABS: 10.0000 mg | ORAL_TABLET | Freq: Every day | ORAL | 3 refills | Status: DC

## 2022-09-03 NOTE — Patient Instructions (Signed)
Medication Instructions:  Please increase the clonidine up to 0.1 mg three times a day  If you need a refill on your cardiac medications before your next appointment, please call your pharmacy.   Lab work: No new labs needed  Testing/Procedures: No new testing needed  Follow-Up: At [facility], you and your health needs are our priority.  As part of our continuing mission to provide you with exceptional heart care, we have created designated Provider Care Teams.  These Care Teams include your primary Cardiologist (physician) and Advanced Practice Providers (APPs -  Physician Assistants and Nurse Practitioners) who all work together to provide you with the care you need, when you need it.  You will need a follow up appointment in 12 months  Providers on your designated Care Team:   Murray Hodgkins, NP Christell Faith, PA-C Cadence Kathlen Mody, [place]  COVID-19 Vaccine Information can be found at: [email or web address] For questions related to vaccine distribution or appointments, please email [email or web address] or call [phone].

## 2022-09-17 ENCOUNTER — Telehealth: Payer: Self-pay | Admitting: Cardiovascular Disease

## 2022-09-17 MED ORDER — CLONIDINE HCL 0.1 MG PO TABS
0.1000 mg | ORAL_TABLET | Freq: Three times a day (TID) | ORAL | 1 refills | Status: DC
Start: 2022-09-17 — End: 2022-09-18

## 2022-09-17 NOTE — Telephone Encounter (Signed)
Pt c/o medication issue:  1. Name of Medication:   cloNIDine (CATAPRES) 0.1 MG tablet    2. How are you currently taking this medication (dosage and times per day)?   3. Are you having a reaction (difficulty breathing--STAT)?   4. What is your medication issue? Patient states that her pharmacy told her to call her cardiologist in regards to this medications. She states they may need a prior auth, but not sure. Would like a call back to discuss.  She states that she is also out of this medication as of today.

## 2022-09-17 NOTE — Telephone Encounter (Signed)
Reviewed that her message was sent over to our prior authorization team. The mail order pharmacy was who requested that prior auth. She is going to be out of her medication today and recommended that we send in 30 day supply to her local pharmacy until we can figure this out. She was appreciative for the call and advised we will call back with the status of her prior auth.

## 2022-09-17 NOTE — Telephone Encounter (Signed)
Left voicemail message stating that I would send this to our prior authorization department and will then give her a call back with that information. Also that the clonidine with [facility] is $15.31 using the goodRx.

## 2022-09-18 ENCOUNTER — Other Ambulatory Visit (HOSPITAL_COMMUNITY): Payer: Self-pay

## 2022-09-18 MED ORDER — CLONIDINE HCL 0.1 MG PO TABS: 0.1000 mg | ORAL_TABLET | Freq: Three times a day (TID) | ORAL | 3 refills | Status: AC

## 2022-09-18 NOTE — Telephone Encounter (Signed)
  Whether she gets it mail order or retail it still should be billed through the same insurance plan, which at this time is not requiring prior authorization for this drug.

## 2022-09-18 NOTE — Telephone Encounter (Signed)
  Per test claim no PA is needed. Her pharmacy filled yesterday.

## 2022-09-18 NOTE — Telephone Encounter (Signed)
Reviewed with patient that prior Berkley Harvey is not required for [facility] or Mail order. She verbalized understanding with no further questions at this time.

## 2022-09-18 NOTE — Addendum Note (Signed)
Addended by: Bryna Colander on: 09/18/2022 03:33 PM   Modules accepted: Orders

## 2022-10-30 ENCOUNTER — Other Ambulatory Visit: Payer: Self-pay | Admitting: Internal Medicine

## 2022-10-30 DIAGNOSIS — Z1231 Encounter for screening mammogram for malignant neoplasm of breast: Secondary | ICD-10-CM

## 2022-10-30 DIAGNOSIS — Z0000 Encounter for general adult medical examination without abnormal findings: Secondary | ICD-10-CM

## 2022-10-30 DIAGNOSIS — G4489 Other headache syndrome: Secondary | ICD-10-CM

## 2022-11-08 ENCOUNTER — Ambulatory Visit
Admission: RE | Admit: 2022-11-08 | Discharge: 2022-11-08 | Disposition: A | Discharge status: Home / Self Care | Payer: Medicare Other | Source: Ambulatory Visit | Attending: Internal Medicine | Admitting: Internal Medicine

## 2022-11-08 DIAGNOSIS — G4489 Other headache syndrome: Secondary | ICD-10-CM | POA: Diagnosis present

## 2022-11-08 DIAGNOSIS — Z0000 Encounter for general adult medical examination without abnormal findings: Secondary | ICD-10-CM | POA: Diagnosis not present

## 2022-11-19 ENCOUNTER — Ambulatory Visit
Admission: EM | Admit: 2022-11-19 | Discharge: 2022-11-19 | Disposition: A | Discharge status: Home / Self Care | Payer: Medicare Other | Attending: Emergency Medicine | Admitting: Emergency Medicine

## 2022-11-19 ENCOUNTER — Encounter: Payer: Self-pay | Admitting: Emergency Medicine

## 2022-11-19 DIAGNOSIS — N3001 Acute cystitis with hematuria: Secondary | ICD-10-CM | POA: Diagnosis present

## 2022-11-19 LAB — URINALYSIS, W/ REFLEX TO CULTURE (INFECTION SUSPECTED)
Bilirubin Urine: NEGATIVE
Glucose, UA: NEGATIVE mg/dL
Ketones, ur: NEGATIVE mg/dL
Nitrite: NEGATIVE
Protein, ur: NEGATIVE mg/dL
Specific Gravity, Urine: 1.015 (ref 1.005–1.030)
WBC, UA: >50 WBC/hpf (ref 0–5)
pH: 5.5 (ref 5.0–8.0)

## 2022-11-19 MED ORDER — NITROFURANTOIN MONOHYD MACRO 100 MG PO CAPS: 100.0000 mg | ORAL_CAPSULE | Freq: Two times a day (BID) | ORAL | 0 refills | Status: DC

## 2022-11-19 NOTE — ED Triage Notes (Signed)
Pt c/o cloudy urine, incontinence and urine odor x 1 week. Pt also c/o bilateral ear pain.

## 2022-11-19 NOTE — Discharge Instructions (Addendum)
Take the Macrobid twice daily for 5 days with food for treatment of urinary tract infection.  Increase your oral fluid intake so that you increase your urine production and or flushing your urinary system.  Take an over-the-counter probiotic, such as Culturelle-Align-Activia, 1 hour after each dose of antibiotic to prevent diarrhea or yeast infections from forming.  We will culture urine and change the antibiotics if necessary.  Return for reevaluation, or see your primary care provider, for any new or worsening symptoms.  

## 2022-11-19 NOTE — ED Provider Notes (Signed)
[facility]    CSN: [id number] Arrival date & time: 11/19/22  1632      History   Chief Complaint Chief Complaint  Patient presents with   cloudy urine    Otalgia    HPI [name] is a 77 y.o. female.   HPI  77 year old female with past medical history significant for type 2 diabetes, hyperlipidemia, glaucoma, and mild remittent asthma presents for evaluation of 3 days worth of bilateral earache as well as urinary urgency, frequency, and incontinence.  She reports that she did see blood in her urine 1 time.  She denies any pain with urination or fever.  She does report that her bilateral eustachian tubes have also been itchy and she thinks it secondary to the clonidine that she takes for high blood pressure as it causes her nasal passages to dry out.  Past Medical History:  Diagnosis Date   Anterolisthesis    Cervical spine   Aortic arch aneurysm (HCC)    Asthma    Connective tissue disorder (HCC)    recurrent carotid arteritis, temporal arteritis, vasculitis mandible, general hepatitis, avascular necrosis bilat   DDD (degenerative disc disease), lumbar    Diabetes mellitus without complication (HCC)    Diverticulosis    Duodenitis    Ganglion cyst of right foot    Gouty arthritis    Hiatal hernia with GERD    History of cardiovascular stress test    a. 07/2018 [facility]): Fixed inferoapical defect w/ nl contraction-->attenuation artifact. No ischemia. EF 63%.   Hyperlipidemia    Hypertension    a. 02/2019 Renal artery duplex: no evidence of prox RAS.   Hyperuricemia    Keratitis sicca, bilateral (HCC)    Lymphedema of both lower extremities    Osteoarthritis    Pericarditis    Recurrent: Aug 97, July 05, Sept 08, July 16, July 18   PUD (peptic ulcer disease)    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HCC)    Sjogren's syndrome (HCC)    Syncope    Tendonitis, Achilles, right    Tortuous colon    Trigger finger     Patient Active Problem List   Diagnosis  Date Noted   Abdominal pain 07/05/2021   H/O total hip arthroplasty, right 07/12/2020   Status post left partial knee replacement 07/12/2020   Nausea 03/31/2020   Multiple thyroid nodules 12/28/2019   Thyroid nodule 09/29/2019   Lymphedema 11/10/2018   PAD (peripheral artery disease) (HCC) 10/12/2018   Aortic atherosclerosis (HCC) 10/12/2018   Carotid stenosis, asymptomatic, bilateral 10/12/2018   Positive colorectal cancer screening using Cologuard test 10/09/2018   Gastroesophageal reflux disease    Acute gastritis without hemorrhage    Osteoarthritis 04/29/2018   Hiatal hernia with GERD 04/29/2018   Hyperlipidemia 04/29/2018   Lymphedema of both lower extremities 04/29/2018   Left knee pain 10/23/2016   Cataracts, bilateral 07/31/2016   Glaucoma 07/31/2016   Lateral epicondylitis of left elbow 10/20/2015   Vitamin D deficiency 10/20/2015   Type 2 diabetes, controlled, with neuropathy (HCC) 10/16/2015   Type II diabetes mellitus with complication (HCC) 10/16/2015   Gouty arthritis 06/03/2015   H/O syncope 06/03/2015   Mild intermittent asthma 06/03/2015   Multiple gastric ulcers 06/03/2015   Schatzki's ring 06/03/2015   Undifferentiated connective tissue disease (HCC) 06/03/2015   Bone disorder 04/05/2015   Dental injury 04/05/2015   Pericarditis 04/05/2015   Sjogren's syndrome (HCC) 09/07/2014   Elevated alkaline phosphatase level 08/24/2014   Lactose  intolerance 08/24/2014   Obesity 08/24/2014   Peripheral edema 08/24/2014   Benign hypertension with CKD (chronic kidney disease) stage III 08/03/2014   Abdominal adhesions 08/02/2014   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HCC) 08/02/2014   Degenerative joint disease (DJD) of lumbar spine 08/02/2014   Diverticulosis of both small and large intestine 08/02/2014   Hyperuricemia 08/02/2014   Pure hypercholesterolemia 08/02/2014   Trigger finger 08/02/2014   Right shoulder pain 07/29/2014   Benign neoplasm of colon  06/30/2010   Right upper quadrant pain 06/30/2010    Past Surgical History:  Procedure Laterality Date   ABDOMINAL HYSTERECTOMY     BREAST BIOPSY     BREAST EXCISIONAL BIOPSY Left 1986   neg   CHOLECYSTECTOMY     COLONOSCOPY WITH PROPOFOL N/A 08/26/2018   Procedure: COLONOSCOPY WITH PROPOFOL;  Surgeon: Midge Minium, MD;  Location: [facility] ENDOSCOPY;  Service: Endoscopy;  Laterality: N/A;   CYSTOSCOPY  02/26/2018   Anola Gurney, MD    distal arthrectomy     ESOPHAGOGASTRODUODENOSCOPY (EGD) WITH PROPOFOL N/A 08/26/2018   Procedure: ESOPHAGOGASTRODUODENOSCOPY (EGD) WITH PROPOFOL;  Surgeon: Midge Minium, MD;  Location: [facility] ENDOSCOPY;  Service: Endoscopy;  Laterality: N/A;   EXCISION NEUROMA     KNEE SURGERY Bilateral    1985, 2001, 11/2002, 12/2006   panhysterectomy  10/1983   SHOULDER SURGERY Right    reverse total arthroplasty w/ biceps tenodesis   TONSILLECTOMY AND ADENOIDECTOMY     TOTAL HIP ARTHROPLASTY Right 04/2007   WISDOM TOOTH EXTRACTION      OB History   No obstetric history on file.      Home Medications    Prior to Admission medications   Medication Sig Start Date End Date Taking? Authorizing Provider  doxazosin (CARDURA) 1 MG tablet Take 1 tablet by mouth at bedtime. 10/26/22 10/26/23 Yes [provider]  acetaminophen (TYLENOL) 500 MG tablet Take 500 mg by mouth daily as needed.    [provider]  albuterol (VENTOLIN HFA) 108 (90 Base) MCG/ACT inhaler Inhale 1-2 puffs into the lungs every 4 (four) hours as needed for wheezing or shortness of breath. 06/03/21   Shirlee Latch, PA-C  ARTIFICIAL TEAR OP Apply to eye as needed.     [provider]  BD INSULIN SYRINGE U/F 31G X 5/16" 0.5 ML MISC USE AS DIRECTED Patient not taking: Reported on 09/03/2022 08/30/20   Marguarite Arbour, MD  BD INSULIN SYRINGE U/F 31G X 5/16" 1 ML MISC USE AS DIRECTED. WITH HUMALOG Patient not taking: Reported on 09/03/2022 07/28/18   Galen Manila, NP  BIOTIN  PO Take 1 mg by mouth daily.  Patient not taking: Reported on 09/03/2022    [provider]  budesonide-formoterol (SYMBICORT) 80-4.5 MCG/ACT inhaler Inhale 2 puffs into the lungs 2 (two) times daily. Patient not taking: Reported on 09/03/2022 06/09/21   Rodriguez-Southworth, Nettie Elm, PA-C  Cholecalciferol (VITAMIN D3) 25 MCG (1000 UT) CAPS Take 4 capsules by mouth daily.     [provider]  clindamycin (CLEOCIN) 300 MG capsule Take 600 mg by mouth. 1 Hour before dental procedures Patient not taking: Reported on 09/03/2022    [provider]  cloNIDine (CATAPRES) 0.1 MG tablet Take 1 tablet (0.1 mg total) by mouth 3 (three) times daily. 09/18/22 12/17/22  Antonieta Iba, MD  COLCRYS 0.6 MG tablet Take 1 tablet (0.6 mg total) by mouth 2 (two) times daily as needed. For up to 3 days for pericarditis, or up  to 7 days for gout or connective tissue disorder. 01/14/20   Karamalegos, Netta Neat, DO  cyclobenzaprine (FLEXERIL) 5 MG tablet Take 1 tablet (5 mg total) by mouth daily as needed for muscle spasms. 05/26/20   Malfi, Jodelle Gross, FNP  ezetimibe (ZETIA) 10 MG tablet TAKE 1 TABLET BY MOUTH DAILY Patient not taking: Reported on 09/03/2022 02/26/22   Antonieta Iba, MD  famotidine (PEPCID) 40 MG tablet Take 40 mg by mouth 2 (two) times daily.    [provider]  FREESTYLE LITE test strip USE AS DIRECTED THREE TIMES DAILY FOR DIABETES 03/03/20   Tarri Fuller, FNP  insulin degludec (TRESIBA FLEXTOUCH) 100 UNIT/ML FlexTouch Pen Inject 72 Units into the skin daily.    [provider]  insulin lispro (HUMALOG) 100 UNIT/ML injection Correction scale: take 1 unit per 50 over 150 before meals up to three times daily.  Up to 20 units per day. 02/12/19   Galen Manila, NP  Insulin Pen Needle (PEN NEEDLES) 32G X 5 MM MISC 1 Device by Does not apply route daily. Patient not taking: Reported on 09/03/2022 08/26/19   Tarri Fuller, FNP  lactase (LACTAID) 3000 units  tablet Take by mouth as needed.     [provider]  Lancets (FREESTYLE) lancets Must test x4/day Patient not taking: Reported on 09/03/2022 06/19/16   [provider]  nebivolol (BYSTOLIC) 10 MG tablet Take 1 tablet (10 mg total) by mouth 2 (two) times daily. 09/03/22   Antonieta Iba, MD  nitrofurantoin, macrocrystal-monohydrate, (MACROBID) 100 MG capsule Take 1 capsule (100 mg total) by mouth 2 (two) times daily. 11/19/22   Becky Augusta, NP  ondansetron (ZOFRAN-ODT) 4 MG disintegrating tablet Take 1 tablet (4 mg total) by mouth every 8 (eight) hours as needed for nausea or vomiting. Patient not taking: Reported on 09/03/2022 03/29/20   Tarri Fuller, FNP  phenazopyridine (PYRIDIUM) 200 MG tablet Take 1 tablet (200 mg total) by mouth 3 (three) times daily. Patient not taking: Reported on 09/03/2022 03/27/22   Becky Augusta, NP  rosuvastatin (CRESTOR) 10 MG tablet Take 1 tablet (10 mg total) by mouth daily. 09/03/22   Antonieta Iba, MD    Family History Family History  Problem Relation Age of Onset   Hypertension Mother    Diabetes Mother    Hyperlipidemia Mother    Congestive Heart Failure Mother    Cancer Mother    Non-Hodgkin's lymphoma Sister    Cancer Sister    Diabetes Maternal Grandmother    Breast cancer Neg Hx     Social History Social History   Tobacco Use   Smoking status: Former    Types: Cigarettes    Quit date: 01/22/1996    Years since quitting: 26.8   Smokeless tobacco: Never  Vaping Use   Vaping Use: Never used  Substance Use Topics   Alcohol use: Yes    Comment: occasional-once every 6 months   Drug use: Never     Allergies   Amlodipine, Aspirin, Bee venom, Ciprofloxacin, Codeine, Egg-derived products, Erythromycin, Glimepiride, Hydralazine, Hydrochlorothiazide, Hydrocodone, Hydrocodone-acetaminophen, Hydromorphone, Irbesartan, Irbesartan-hydrochlorothiazide, Lisinopril, Maxitrol [neomycin-polymyxin-dexameth], Metformin and related,  Metformin hcl, Methylcellulose, Metoclopramide, Metoprolol, Minoxidil, Neomycin-bacitracin zn-polymyx, Norvasc [amlodipine besylate], Nsaids, Omeprazole magnesium, Other, Oxycodone-acetaminophen, Penicillins, Pioglitazone, Poison sumac extract, Prilosec [omeprazole], Silver sulfadiazine, Spironolactone, Sulfa antibiotics, Tape, Tetanus toxoid, Tramadol, and Betadine [povidone iodine]   Review of Systems Review of Systems  Constitutional:  Negative for fever.  HENT:  Positive for ear pain.  Negative for congestion, ear discharge and rhinorrhea.   Genitourinary:  Positive for frequency, hematuria and urgency. Negative for dysuria.       Urinary incontinence     Physical Exam Triage Vital Signs ED Triage Vitals  Enc Vitals Group     BP      Pulse      Resp      Temp      Temp src      SpO2      Weight      Height      Head Circumference      Peak Flow      Pain Score      Pain Loc      Pain Edu?      Excl. in GC?    No data found.  Updated Vital Signs BP (!) 158/90 (BP Location: Left Arm)   Pulse 69   Temp 98.7 F (37.1 C) (Oral)   Resp 16   SpO2 98%   Visual Acuity Right Eye Distance:   Left Eye Distance:   Bilateral Distance:    Right Eye Near:   Left Eye Near:    Bilateral Near:     Physical Exam Vitals and nursing note reviewed.  Constitutional:      Appearance: Normal appearance. She is not ill-appearing.  HENT:     Head: Normocephalic and atraumatic.     Right Ear: Tympanic membrane, ear canal and external ear normal. There is no impacted cerumen.     Left Ear: Tympanic membrane, ear canal and external ear normal. There is no impacted cerumen.  Cardiovascular:     Rate and Rhythm: Normal rate and regular rhythm.     Pulses: Normal pulses.     Heart sounds: Normal heart sounds. No murmur heard.    No friction rub. No gallop.  Pulmonary:     Effort: Pulmonary effort is normal.     Breath sounds: Normal breath sounds. No wheezing, rhonchi or rales.   Abdominal:     Tenderness: There is no right CVA tenderness or left CVA tenderness.  Skin:    General: Skin is warm and dry.  Neurological:     Mental Status: She is alert.      UC Treatments / Results  Labs (all labs ordered are listed, but only abnormal results are displayed) Labs Reviewed  URINALYSIS, W/ REFLEX TO CULTURE (INFECTION SUSPECTED) - Abnormal; Notable for the following components:      Result Value   APPearance HAZY (*)    Hgb urine dipstick TRACE (*)    Leukocytes,Ua SMALL (*)    Bacteria, UA FEW (*)    All other components within normal limits  URINE CULTURE    EKG   Radiology No results found.  Procedures Procedures (including critical care time)  Medications Ordered in UC Medications - No data to display  Initial Impression / Assessment and Plan / UC Course  I have reviewed the triage vital signs and the nursing notes.  Pertinent labs & imaging results that were available during my care of the patient were reviewed by me and considered in my medical decision making (see chart for details).   The patient is a nontoxic-appearing 77 year old female who presents for evaluation of 1 week worth of cloudy urine, urinary incontinence, and urinary odor.  She also has had some urgency and frequency and did have blood in her urine 1 day.  She has no CVA tenderness on exam.  She  does have a history of recurrent UTIs.  I will order UA to evaluate for the presence of urinary tract infection.  She is also complaining of bilateral earaches and itchy eustachian tubes that has been going on for the same length of time.  She thinks it is secondary to her clonidine which causes a drying of her upper respiratory tract.  On exam patient's tympanic membrane's are pearly gray bilaterally and there is no effusion present.  Both external auditory canals are clear.  Urinalysis shows hazy appearance with trace hemoglobin and small leukocyte esterase but is negative for nitrites or  protein.  Reflex microscopy shows a large number of WBCs with WBC clumps and few bacteria.  Urine will reflex to culture.  I will discharge patient home with a diagnosis of urinary tract infection and start her on Macrobid 100 mg twice daily for 5 days.  I will encourage her to increase her oral fluid intake so that she increases urine production helps flush her urinary tract.  Final Clinical Impressions(s) / UC Diagnoses   Final diagnoses:  Acute cystitis with hematuria     Discharge Instructions      Take the Macrobid twice daily for 5 days with food for treatment of urinary tract infection.  Increase your oral fluid intake so that you increase your urine production and or flushing your urinary system.  Take an over-the-counter probiotic, such as Culturelle-Align-Activia, 1 hour after each dose of antibiotic to prevent diarrhea or yeast infections from forming.  We will culture urine and change the antibiotics if necessary.  Return for reevaluation, or see your primary care provider, for any new or worsening symptoms.      ED Prescriptions     Medication Sig Dispense Auth. Provider   nitrofurantoin, macrocrystal-monohydrate, (MACROBID) 100 MG capsule Take 1 capsule (100 mg total) by mouth 2 (two) times daily. 10 capsule Becky Augusta, NP      PDMP not reviewed this encounter.   Becky Augusta, NP 11/19/22 1730

## 2022-11-20 LAB — URINE CULTURE: Culture: <10000 — AB

## 2022-12-21 ENCOUNTER — Telehealth: Payer: Self-pay | Admitting: Cardiovascular Disease

## 2022-12-21 MED ORDER — NEBIVOLOL HCL 10 MG PO TABS: 10.0000 mg | ORAL_TABLET | Freq: Two times a day (BID) | ORAL | 3 refills | Status: DC

## 2022-12-21 NOTE — Telephone Encounter (Signed)
Requested Prescriptions   Signed Prescriptions Disp Refills   nebivolol (BYSTOLIC) 10 MG tablet 180 tablet 3    Sig: Take 1 tablet (10 mg total) by mouth 2 (two) times daily.    Authorizing Provider: Antonieta Iba    Ordering User: Kendrick Fries

## 2022-12-21 NOTE — Telephone Encounter (Signed)
*  STAT* If patient is at the pharmacy, call can be transferred to refill team.   1. Which medications need to be refilled? (please list name of each medication and dose if known) nebivolol (BYSTOLIC) 10 MG tablet   2. Which pharmacy/location (including street and city if local pharmacy) is medication to be sent to?  [facility] - [place], [place] - [address]      3. Do they need a 30 day or 90 day supply? 90 day  Pt is completely out of medication

## 2022-12-24 ENCOUNTER — Telehealth: Payer: Self-pay | Admitting: Cardiovascular Disease

## 2022-12-24 NOTE — Telephone Encounter (Signed)
Follow Up:    Patient called and said [facility] said they need some information from the doctor in regards to her Bystolic. The Order # is [id number]

## 2022-12-25 NOTE — Telephone Encounter (Signed)
[facility] opens 9:00 am central time. I will contact them at a later time to verify the issue.

## 2022-12-25 NOTE — Telephone Encounter (Signed)
They have faxed over the allergy contraindication to our office on 12/21/2022 it will need to be reviewed and faxed back to [facility] to process refill for Bystolic.

## 2022-12-25 NOTE — Telephone Encounter (Signed)
Please contact and advise [facility] for Beta Blocker allergy contraindication for Bystolic Refill. I didn't see a fax upfront in [place] inbasket but they mentioned they had faxed a notification to our office on 12/21/2022.

## 2022-12-25 NOTE — Telephone Encounter (Signed)
I spoke with [facility] from [facility] to verify medication issue. Pt is needing a medication clarification due to allergy listed.

## 2022-12-25 NOTE — Telephone Encounter (Signed)
[facility]) - [place], [place] - [address] [place] Phone: [phone]

## 2022-12-26 ENCOUNTER — Telehealth: Payer: Self-pay | Admitting: Home Health

## 2022-12-26 ENCOUNTER — Telehealth: Payer: Self-pay | Admitting: Cardiovascular Disease

## 2022-12-26 MED ORDER — NEBIVOLOL HCL 10 MG PO TABS
10.0000 mg | ORAL_TABLET | Freq: Two times a day (BID) | ORAL | 1 refills | Status: AC
Start: 2022-12-26 — End: ?

## 2022-12-26 NOTE — Telephone Encounter (Signed)
Patient called, states she ran out of bystolic, never received refill. Bystolic 10mg  BID re-sent to [facility] at [address] [facility] tonight.

## 2022-12-26 NOTE — Telephone Encounter (Signed)
*  STAT* If patient is at the pharmacy, call can be transferred to refill team.   1. Which medications need to be refilled? (please list name of each medication and dose if known)  nebivolol (BYSTOLIC) 10 MG tablet  2. Which pharmacy/location (including street and city if local pharmacy) is medication to be sent to? [facility] [address] - [place], [place] - [address]    3. Do they need a 30 day or 90 day supply?  90 day  Patient has been completely out of medication for a few days. She says it was supposed to be sent to [facility] originally but there has been trouble getting it approved so she wants it sent to a local pharmacy.

## 2022-12-26 NOTE — Telephone Encounter (Signed)
Received fax this morning from [facility] and placed in Dr. Windell Hummingbird office for review.

## 2023-01-10 ENCOUNTER — Ambulatory Visit: Payer: Medicare Other | Attending: Internal Medicine

## 2023-01-10 DIAGNOSIS — G894 Chronic pain syndrome: Secondary | ICD-10-CM | POA: Diagnosis present

## 2023-01-10 DIAGNOSIS — R2689 Other abnormalities of gait and mobility: Secondary | ICD-10-CM | POA: Diagnosis present

## 2023-01-10 DIAGNOSIS — M6281 Muscle weakness (generalized): Secondary | ICD-10-CM | POA: Diagnosis present

## 2023-01-10 DIAGNOSIS — R270 Ataxia, unspecified: Secondary | ICD-10-CM | POA: Insufficient documentation

## 2023-01-10 DIAGNOSIS — R262 Difficulty in walking, not elsewhere classified: Secondary | ICD-10-CM | POA: Insufficient documentation

## 2023-01-10 NOTE — Therapy (Signed)
OUTPATIENT PHYSICAL THERAPY LOWER EXTREMITY EVALUATION   Patient Name: [name] MRN: [id number] DOB:24-Jan-1946, 77 y.o., female Today's Date: 01/11/2023  END OF SESSION:  PT End of Session - 01/10/23 1623     Visit Number 1    Number of Visits 10    Date for PT Re-Evaluation 02/22/23    Authorization Type Medicare; 2x/week x 4-6 weeks (02/22/23)    PT Start Time 1510    PT Stop Time 1545    PT Time Calculation (min) 35 min    Activity Tolerance Patient limited by pain;Patient tolerated treatment well    Behavior During Therapy Anxious;Restless             Past Medical History:  Diagnosis Date   Anterolisthesis    Cervical spine   Aortic arch aneurysm (HCC)    Asthma    Connective tissue disorder (HCC)    recurrent carotid arteritis, temporal arteritis, vasculitis mandible, general hepatitis, avascular necrosis bilat   DDD (degenerative disc disease), lumbar    Diabetes mellitus without complication (HCC)    Diverticulosis    Duodenitis    Ganglion cyst of right foot    Gouty arthritis    Hiatal hernia with GERD    History of cardiovascular stress test    a. 07/2018 [facility]): Fixed inferoapical defect w/ nl contraction-->attenuation artifact. No ischemia. EF 63%.   Hyperlipidemia    Hypertension    a. 02/2019 Renal artery duplex: no evidence of prox RAS.   Hyperuricemia    Keratitis sicca, bilateral (HCC)    Lymphedema of both lower extremities    Osteoarthritis    Pericarditis    Recurrent: Aug 97, July 05, Sept 08, July 16, July 18   PUD (peptic ulcer disease)    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HCC)    Sjogren's syndrome (HCC)    Syncope    Tendonitis, Achilles, right    Tortuous colon    Trigger finger    Past Surgical History:  Procedure Laterality Date   ABDOMINAL HYSTERECTOMY     BREAST BIOPSY     BREAST EXCISIONAL BIOPSY Left 1986   neg   CHOLECYSTECTOMY     COLONOSCOPY WITH PROPOFOL N/A 08/26/2018   Procedure: COLONOSCOPY WITH PROPOFOL;   Surgeon: Midge Minium, MD;  Location: [facility] ENDOSCOPY;  Service: Endoscopy;  Laterality: N/A;   CYSTOSCOPY  02/26/2018   Anola Gurney, MD    distal arthrectomy     ESOPHAGOGASTRODUODENOSCOPY (EGD) WITH PROPOFOL N/A 08/26/2018   Procedure: ESOPHAGOGASTRODUODENOSCOPY (EGD) WITH PROPOFOL;  Surgeon: Midge Minium, MD;  Location: [facility] ENDOSCOPY;  Service: Endoscopy;  Laterality: N/A;   EXCISION NEUROMA     KNEE SURGERY Bilateral    1985, 2001, 11/2002, 12/2006   panhysterectomy  10/1983   SHOULDER SURGERY Right    reverse total arthroplasty w/ biceps tenodesis   TONSILLECTOMY AND ADENOIDECTOMY     TOTAL HIP ARTHROPLASTY Right 04/2007   WISDOM TOOTH EXTRACTION     Patient Active Problem List   Diagnosis Date Noted   Abdominal pain 07/05/2021   H/O total hip arthroplasty, right 07/12/2020   Status post left partial knee replacement 07/12/2020   Nausea 03/31/2020   Multiple thyroid nodules 12/28/2019   Thyroid nodule 09/29/2019   Lymphedema 11/10/2018   PAD (peripheral artery disease) (HCC) 10/12/2018   Aortic atherosclerosis (HCC) 10/12/2018   Carotid stenosis, asymptomatic, bilateral 10/12/2018   Positive colorectal cancer screening using Cologuard test 10/09/2018   Gastroesophageal reflux disease    Acute gastritis without  hemorrhage    Osteoarthritis 04/29/2018   Hiatal hernia with GERD 04/29/2018   Hyperlipidemia 04/29/2018   Lymphedema of both lower extremities 04/29/2018   Left knee pain 10/23/2016   Cataracts, bilateral 07/31/2016   Glaucoma 07/31/2016   Lateral epicondylitis of left elbow 10/20/2015   Vitamin D deficiency 10/20/2015   Type 2 diabetes, controlled, with neuropathy (HCC) 10/16/2015   Type II diabetes mellitus with complication (HCC) 10/16/2015   Gouty arthritis 06/03/2015   H/O syncope 06/03/2015   Mild intermittent asthma 06/03/2015   Multiple gastric ulcers 06/03/2015   Schatzki's ring 06/03/2015   Undifferentiated connective tissue disease (HCC) 06/03/2015    Bone disorder 04/05/2015   Dental injury 04/05/2015   Pericarditis 04/05/2015   Sjogren's syndrome (HCC) 09/07/2014   Elevated alkaline phosphatase level 08/24/2014   Lactose intolerance 08/24/2014   Obesity 08/24/2014   Peripheral edema 08/24/2014   Benign 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 Judithann Sheen  REFERRING PROVIDER: Dr. Judithann Sheen  REFERRING DIAG: Ataxia  THERAPY DIAG:  Muscle weakness (generalized)  Chronic pain syndrome  Difficulty in walking, not elsewhere classified  Balance problem  Rationale for Evaluation and Treatment: Rehabilitation  ONSET DATE: June 2024  SUBJECTIVE:   SUBJECTIVE STATEMENT: She reports she went on a boat at [facility] in June, 2024; a few days later she had an "episode" where "everything hit her"; she describes it as being painful in her back, hips, and legs.  She reports there was no falls or any specific injury or incident that happened while on the boat that day.  She had a f/u with her PCP after this "episode" for her annual wellness visit and mentioned it; she states she was referred to PT.  She has also been to chiropractor a few visits (not sure of tx) and it was "helpful" (not clear how or what was actually helpful).    She feels like she is having trouble walking sometimes since this episode in June.  Overall, ISQ- not worsening or improving.  She feels like she has difficulty walking up/down steps and walking around her house   Aggravating factors: lying supine,  prolonged standing and walking- bother her back and hip pain.  Alleviating factors: "resting"  No specific localized area of pain; describes generalized low back, L hip (has h/o AVN and is waiting for hip replacement), and leg pain.  She does not use any assistive device for ambulation.   PERTINENT HISTORY: Avn L hip, medial L knee replacement, 2 arthroscopic surgeries on R knee, R THA; no lumbar spine surgeries; no major ankle surgeries  VERY EXTENSIVE PMH LIST  PAIN:  Are you having pain? Yes, 0/10 best, at worst "15 out of 10", and not able to actually give a number when asked today  PRECAUTIONS: Fall  RED FLAGS: Bowel or bladder incontinence: Yes: pt has been to tx for this prior to her June "episode", not a new concern and Abdominal aneurysm: Yes: Per chart review in EPIC- has had imaging- CT scan June 2024:     IMPRESSION:  1. 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  2.  Upper limits of normal ascending thoracic aorta measuring 3.9 cm.   Has a cardiologist who is monitoring this and last saw in June 2024 WEIGHT BEARING RESTRICTIONS: No  FALLS:  Has patient fallen in last 6 months? No  LIVING ENVIRONMENT: Lives with: lives with their family and lives with an adult companion Lives in: House/apartment Stairs: No stairs inside, has 6 to enter/exit home and she is doing this Has following equipment at home: None  OCCUPATION: retired  PLOF: Independent  PATIENT GOALS: "to get her mobility back"; to move around better without being limited by her back, hip pain  NEXT MD VISIT: did not report  OBJECTIVE:   DIAGNOSTIC FINDINGS: no recent of spine, hips, LE's  PATIENT SURVEYS:  FOTO Pt arrived late and did not complete at end of session.  Will administer at visit #2  COGNITION: Overall cognitive status:  pt easily distracted during history  taking and does not stay on track with answering questions today      SENSATION: WFL no sensation deficits in lower extremity dermatomal pattern  EDEMA:  Yes, pt reports having lymphedema; she is wearing lower extremity compression socks/stockins during today's session and has worked with OT for this condition  MUSCLE LENGTH: Hamstrings: not limited  POSTURE: rounded shoulders  PALPATION: Generalized tender to palpation along L lateral hip  LOWER EXTREMITY ROM:  Active ROM Right eval Left eval  Hip flexion 110 100  Hip extension    Hip abduction    Hip adduction    Hip internal rotation 10 10  Hip external rotation 25 25  Knee flexion Impaired compared to L   Knee extension    Ankle dorsiflexion    Ankle plantarflexion    Ankle inversion    Ankle eversion     (Blank rows = not tested)  LOWER EXTREMITY MMT:  MMT Right eval Left eval  Hip flexion 4+ 4  Hip extension    Hip abduction    Hip adduction    Hip internal rotation    Hip external rotation    Knee flexion 4- pain 4  Knee extension 4- pain 4  Ankle dorsiflexion 5 5  Ankle plantarflexion    Ankle inversion    Ankle eversion     (Blank rows = not tested)  LOWER EXTREMITY SPECIAL TESTS:  Unable to elicit b/l Achilles or Patellar DTR today (-) seated slump test R and L for reproduction of sx LE myotome testing limited by R knee pain and L hip pain  FUNCTIONAL TESTS:  5 times sit to stand: 31 seconds with use of b/l UE support  SLS: pt not able to stand L SLS>3 seconds, R 5 seconds today GAIT: Distance walked: pt amb into/out of clinic on flat surface Assistive device utilized: None Level of assistance: Complete Independence Comments: pt amb slowly and with decreased stride length and decreased knee extension b/l  TODAY'S TREATMENT:  DATE: 01/10/23  Pt arrived late; needed  frequent redirection throughout initial evaluation. Initial evaluation only today.  PATIENT EDUCATION:  Education details: Purpose of PT, discussed working on strengthening, balance exercises to improve her ability to perform her daily activities  Person educated: Patient Education method: Explanation Education comprehension: verbalized understanding and needs further education  HOME EXERCISE PROGRAM: To be initiated at future visit  ASSESSMENT:  CLINICAL IMPRESSION: Patient is a 77 y.o. F who was seen today for physical therapy evaluation and treatment ataxia; however her presentation today appears more consistent with chronic pain syndrome of lower back, hips, lower extremities.  Will perform further evaluation at next visit due to time constraints and frequent redirection needed during today's evaluation.   She also has a very extensive current and past medical history which may influence her participation and prognosis with PT.    OBJECTIVE IMPAIRMENTS: Abnormal gait, cardiopulmonary status limiting activity, decreased activity tolerance, decreased balance, decreased endurance, decreased mobility, difficulty walking, decreased ROM, decreased strength, and pain.   ACTIVITY LIMITATIONS: carrying, lifting, bending, squatting, sleeping, stairs, continence, and locomotion level  PARTICIPATION LIMITATIONS: meal prep, cleaning, laundry, and community activity  PERSONAL FACTORS: Age, Behavior pattern, Past/current experiences, Time since onset of injury/illness/exacerbation, and 3+ comorbidities: cardiac, autoimmune, DM, R THA, L hip AVN, R knee OA, lymphedema, see PMH above  are also affecting patient's functional outcome.   REHAB POTENTIAL: Good  CLINICAL DECISION MAKING: Evolving/moderate complexity  EVALUATION COMPLEXITY: Moderate   GOALS: Goals reviewed with patient? Yes  SHORT TERM GOALS: Target date: 01/24/23 Pt will be able to perform HEP for LE strengthening 3x/week Baseline:  to be initiated at future visit Goal status: INITIAL   LONG TERM GOALS: Target date: 02/22/23  Improve FOTO to predicted DC level indicating improved ability to perform her daily activities without being limited by chronic pain Baseline: to be administered at visit #2 Goal status: INITIAL  2.  Pt will be able to participate in 15 minutes therapeutic exercise for LE strength and balance at RPE level 4-6 without a seated rest break to improve her overall ability to perform her daily activities at home that require standing (kitchen/meal prep/chores)without being limited by chronic pain Baseline: 3-5 minutes Goal status: INITIAL  3.  Improve 5x STS to <20 seconds indicating improved LE strength and reduced fall risk Baseline: 31(with use of UE support) Goal status: INITIAL   PLAN:  PT FREQUENCY: 2x/week  PT DURATION: 6 weeks  PLANNED INTERVENTIONS: Therapeutic exercises, Therapeutic activity, Neuromuscular re-education, Balance training, Gait training, Patient/Family education, Self Care, and Joint mobilization  PLAN FOR NEXT SESSION: administer FOTO, check BP before exercise; further assess balance/coordination/upper motor neuron tests if appropriate; begin functional LE strength/balance/gait training.   Max Fickle, PT, DPT, OCS Ardine Bjork, PT 01/11/2023, 4:17 PM

## 2023-01-14 ENCOUNTER — Ambulatory Visit: Payer: Medicare Other

## 2023-01-14 DIAGNOSIS — M6281 Muscle weakness (generalized): Secondary | ICD-10-CM | POA: Diagnosis not present

## 2023-01-14 DIAGNOSIS — R2689 Other abnormalities of gait and mobility: Secondary | ICD-10-CM

## 2023-01-14 DIAGNOSIS — G894 Chronic pain syndrome: Secondary | ICD-10-CM

## 2023-01-14 DIAGNOSIS — R262 Difficulty in walking, not elsewhere classified: Secondary | ICD-10-CM

## 2023-01-14 NOTE — Therapy (Signed)
OUTPATIENT PHYSICAL THERAPY LOWER TREATMENT   Patient Name: [name] MRN: [id number] DOB:07/20/1945, 77 y.o., female Today's Date: 01/14/2023  END OF SESSION:  PT End of Session - 01/14/23 1215     Visit Number 2    Number of Visits 10    Date for PT Re-Evaluation 02/22/23    Authorization Type Medicare; 2x/week x 4-6 weeks (02/22/23)    Authorization Time Period last prog note 9/28    Progress Note Due on Visit 10    PT Start Time 1200    PT Stop Time 1240    PT Time Calculation (min) 40 min    Activity Tolerance Patient tolerated treatment well    Behavior During Therapy WFL for tasks assessed/performed             Past Medical History:  Diagnosis Date   Anterolisthesis    Cervical spine   Aortic arch aneurysm (HCC)    Asthma    Connective tissue disorder (HCC)    recurrent carotid arteritis, temporal arteritis, vasculitis mandible, general hepatitis, avascular necrosis bilat   DDD (degenerative disc disease), lumbar    Diabetes mellitus without complication (HCC)    Diverticulosis    Duodenitis    Ganglion cyst of right foot    Gouty arthritis    Hiatal hernia with GERD    History of cardiovascular stress test    a. 07/2018 [facility]): Fixed inferoapical defect w/ nl contraction-->attenuation artifact. No ischemia. EF 63%.   Hyperlipidemia    Hypertension    a. 02/2019 Renal artery duplex: no evidence of prox RAS.   Hyperuricemia    Keratitis sicca, bilateral (HCC)    Lymphedema of both lower extremities    Osteoarthritis    Pericarditis    Recurrent: Aug 97, July 05, Sept 08, July 16, July 18   PUD (peptic ulcer disease)    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HCC)    Sjogren's syndrome (HCC)    Syncope    Tendonitis, Achilles, right    Tortuous colon    Trigger finger    Past Surgical History:  Procedure Laterality Date   ABDOMINAL HYSTERECTOMY     BREAST BIOPSY     BREAST EXCISIONAL BIOPSY Left 1986   neg   CHOLECYSTECTOMY     COLONOSCOPY WITH  PROPOFOL N/A 08/26/2018   Procedure: COLONOSCOPY WITH PROPOFOL;  Surgeon: Midge Minium, MD;  Location: [facility] ENDOSCOPY;  Service: Endoscopy;  Laterality: N/A;   CYSTOSCOPY  02/26/2018   Anola Gurney, MD    distal arthrectomy     ESOPHAGOGASTRODUODENOSCOPY (EGD) WITH PROPOFOL N/A 08/26/2018   Procedure: ESOPHAGOGASTRODUODENOSCOPY (EGD) WITH PROPOFOL;  Surgeon: Midge Minium, MD;  Location: [facility] ENDOSCOPY;  Service: Endoscopy;  Laterality: N/A;   EXCISION NEUROMA     KNEE SURGERY Bilateral    1985, 2001, 11/2002, 12/2006   panhysterectomy  10/1983   SHOULDER SURGERY Right    reverse total arthroplasty w/ biceps tenodesis   TONSILLECTOMY AND ADENOIDECTOMY     TOTAL HIP ARTHROPLASTY Right 04/2007   WISDOM TOOTH EXTRACTION     Patient Active Problem List   Diagnosis Date Noted   Abdominal pain 07/05/2021   H/O total hip arthroplasty, right 07/12/2020   Status post left partial knee replacement 07/12/2020   Nausea 03/31/2020   Multiple thyroid nodules 12/28/2019   Thyroid nodule 09/29/2019   Lymphedema 11/10/2018   PAD (peripheral artery disease) (HCC) 10/12/2018   Aortic atherosclerosis (HCC) 10/12/2018   Carotid stenosis, asymptomatic, bilateral 10/12/2018   Positive  colorectal cancer screening using Cologuard test 10/09/2018   Gastroesophageal reflux disease    Acute gastritis without hemorrhage    Osteoarthritis 04/29/2018   Hiatal hernia with GERD 04/29/2018   Hyperlipidemia 04/29/2018   Lymphedema of both lower extremities 04/29/2018   Left knee pain 10/23/2016   Cataracts, bilateral 07/31/2016   Glaucoma 07/31/2016   Lateral epicondylitis of left elbow 10/20/2015   Vitamin D deficiency 10/20/2015   Type 2 diabetes, controlled, with neuropathy (HCC) 10/16/2015   Type II diabetes mellitus with complication (HCC) 10/16/2015   Gouty arthritis 06/03/2015   H/O syncope 06/03/2015   Mild intermittent asthma 06/03/2015   Multiple gastric ulcers 06/03/2015   Schatzki's ring  06/03/2015   Undifferentiated connective tissue disease (HCC) 06/03/2015   Bone disorder 04/05/2015   Dental injury 04/05/2015   Pericarditis 04/05/2015   Sjogren's syndrome (HCC) 09/07/2014   Elevated alkaline phosphatase level 08/24/2014   Lactose intolerance 08/24/2014   Obesity 08/24/2014   Peripheral edema 08/24/2014   Benign 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 Judithann Sheen  REFERRING PROVIDER: Dr. Judithann Sheen  REFERRING DIAG: Ataxia  THERAPY DIAG:  Muscle weakness (generalized)  Chronic pain syndrome  Difficulty in walking, not elsewhere classified  Balance problem  Rationale for Evaluation and Treatment: Rehabilitation  ONSET DATE: June 2024  SUBJECTIVE:   SUBJECTIVE STATEMENT: Pt says no pain today. Her painful areas only hurt with increased activity, but improve with rest. No other updates.   PERTINENT HISTORY: She reports she went on a boat at [facility] in June, 2024; a few days later she had an "episode" where "everything hit her"; she describes it as being painful in her back, hips, and legs.  She reports there was no falls or any specific injury or incident that happened while on the boat that day. She had a f/u with her PCP after this "episode" for her annual wellness visit and mentioned it; she states she was referred to PT.  She has also been to chiropractor a few visits (not sure of tx) and it was "helpful" (not clear how or what was actually helpful). She feels like she is having trouble walking  sometimes since this episode in June.  Overall, ISQ- not worsening or improving. She feels like she has difficulty walking up/down steps and walking around her house. Avn L hip, medial L knee replacement, 2 arthroscopic surgeries on R knee, R THA; no lumbar spine surgeries; no major ankle surgeries  PAIN:  Are you having pain? No   PRECAUTIONS: Fall   WEIGHT BEARING RESTRICTIONS: No  FALLS:  Has patient fallen in last 6 months? No  PATIENT GOALS: "to get her mobility back"; to move around better without being limited by her back, hip pain  NEXT MD VISIT: unknown  OBJECTIVE:   TREATMENT 01/14/23  -FOTO survey -BP: 129/68  70bpm  -Nustep: seat 11, arms 11, level 1, SPM  ad lib (40s), 10 minutes  -STS from chair + airex, hands free 1x10  -seated marching, alternating pattern 1x20  -seated rainbow ball squeeze  at the knees 10x5secH  -seated LAQ 1x15 bilat  -seated greenTB clams 1x15, rainbow ball betwixt feet for joint spacing -lateral side stepping in bars 3RT    PATIENT EDUCATION:  Education details: Nustep supposed to feel good, not bad.   HOME EXERCISE PROGRAM: None given  ASSESSMENT:  CLINICAL IMPRESSION: Commenced with AA/ROM, gentle strengthening. Pt arrives and leaves without pain complaint. Detailed cues given for specific attention to detail for details of exercises performed. Pt will benefit from skilled PT to maximize return to PLOF.   OBJECTIVE IMPAIRMENTS: Abnormal gait, cardiopulmonary status limiting activity, decreased activity tolerance, decreased balance, decreased endurance, decreased mobility, difficulty walking, decreased ROM, decreased strength, and pain.   ACTIVITY LIMITATIONS: carrying, lifting, bending, squatting, sleeping, stairs, continence, and locomotion level  PARTICIPATION LIMITATIONS: meal prep, cleaning, laundry, and community activity  PERSONAL FACTORS: Age, Behavior pattern, Past/current experiences, Time since onset of  injury/illness/exacerbation, and 3+ comorbidities: cardiac, autoimmune, DM, R THA, L hip AVN, R knee OA, lymphedema, see PMH above  are also affecting patient's functional outcome.   REHAB POTENTIAL: Good  CLINICAL DECISION MAKING: Evolving/moderate complexity  EVALUATION COMPLEXITY: Moderate   GOALS: Goals reviewed with patient? Yes  SHORT TERM GOALS: Target date: 01/24/23 Pt will be able to perform HEP for LE strengthening 3x/week Baseline: to be initiated at future visit Goal status: INITIAL   LONG TERM GOALS: Target date: 02/22/23  Improve FOTO to predicted DC level indicating improved ability to perform her daily activities without being limited by chronic pain Baseline: to be administered at visit #2 Goal status: INITIAL  2.  Pt will be able to participate in 15 minutes therapeutic exercise for LE strength and balance at RPE level 4-6 without a seated rest break to improve her overall ability to perform her daily activities at home that require standing (kitchen/meal prep/chores)without being limited by chronic pain Baseline: 3-5 minutes Goal status: INITIAL  3.  Improve 5x STS to <20 seconds indicating improved LE strength and reduced fall risk Baseline: 31(with use of UE support) Goal status: INITIAL   PLAN:  PT FREQUENCY: 2x/week  PT DURATION: 6 weeks  PLANNED INTERVENTIONS: Therapeutic exercises, Therapeutic activity, Neuromuscular re-education, Balance training, Gait training, Patient/Family education, Self Care, and Joint mobilization  PLAN FOR NEXT SESSION: functional LE strength/balance/gait training.    , C, PT 01/14/2023, 12:17 PM   12:17 PM, 01/14/23 Rosamaria Lints, PT, DPT Physical Therapist - [facility] in [place]  [phone] (Office)

## 2023-01-21 ENCOUNTER — Ambulatory Visit: Payer: Medicare Other

## 2023-01-21 DIAGNOSIS — M6281 Muscle weakness (generalized): Secondary | ICD-10-CM

## 2023-01-21 DIAGNOSIS — R262 Difficulty in walking, not elsewhere classified: Secondary | ICD-10-CM

## 2023-01-21 DIAGNOSIS — G894 Chronic pain syndrome: Secondary | ICD-10-CM

## 2023-01-21 DIAGNOSIS — R2689 Other abnormalities of gait and mobility: Secondary | ICD-10-CM

## 2023-01-21 NOTE — Therapy (Signed)
OUTPATIENT PHYSICAL THERAPY LOWER TREATMENT   Patient Name: [name] MRN: [id number] DOB:12/09/1945, 77 y.o., female Today's Date: 01/21/2023  END OF SESSION:  PT End of Session - 01/21/23 1424     Visit Number 3    Number of Visits 10    Date for PT Re-Evaluation 02/22/23    Authorization Type Medicare; 2x/week x 4-6 weeks (02/22/23)    Progress Note Due on Visit 10    PT Start Time 1425    PT Stop Time 1500    PT Time Calculation (min) 35 min    Activity Tolerance Patient tolerated treatment well    Behavior During Therapy WFL for tasks assessed/performed             Past Medical History:  Diagnosis Date   Anterolisthesis    Cervical spine   Aortic arch aneurysm (HCC)    Asthma    Connective tissue disorder (HCC)    recurrent carotid arteritis, temporal arteritis, vasculitis mandible, general hepatitis, avascular necrosis bilat   DDD (degenerative disc disease), lumbar    Diabetes mellitus without complication (HCC)    Diverticulosis    Duodenitis    Ganglion cyst of right foot    Gouty arthritis    Hiatal hernia with GERD    History of cardiovascular stress test    a. 07/2018 [facility]): Fixed inferoapical defect w/ nl contraction-->attenuation artifact. No ischemia. EF 63%.   Hyperlipidemia    Hypertension    a. 02/2019 Renal artery duplex: no evidence of prox RAS.   Hyperuricemia    Keratitis sicca, bilateral (HCC)    Lymphedema of both lower extremities    Osteoarthritis    Pericarditis    Recurrent: Aug 97, July 05, Sept 08, July 16, July 18   PUD (peptic ulcer disease)    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HCC)    Sjogren's syndrome (HCC)    Syncope    Tendonitis, Achilles, right    Tortuous colon    Trigger finger    Past Surgical History:  Procedure Laterality Date   ABDOMINAL HYSTERECTOMY     BREAST BIOPSY     BREAST EXCISIONAL BIOPSY Left 1986   neg   CHOLECYSTECTOMY     COLONOSCOPY WITH PROPOFOL N/A 08/26/2018   Procedure: COLONOSCOPY WITH  PROPOFOL;  Surgeon: Midge Minium, MD;  Location: [facility] ENDOSCOPY;  Service: Endoscopy;  Laterality: N/A;   CYSTOSCOPY  02/26/2018   Anola Gurney, MD    distal arthrectomy     ESOPHAGOGASTRODUODENOSCOPY (EGD) WITH PROPOFOL N/A 08/26/2018   Procedure: ESOPHAGOGASTRODUODENOSCOPY (EGD) WITH PROPOFOL;  Surgeon: Midge Minium, MD;  Location: [facility];  Service: Endoscopy;  Laterality: N/A;   EXCISION NEUROMA     KNEE SURGERY Bilateral    1985, 2001, 11/2002, 12/2006   panhysterectomy  10/1983   SHOULDER SURGERY Right    reverse total arthroplasty w/ biceps tenodesis   TONSILLECTOMY AND ADENOIDECTOMY     TOTAL HIP ARTHROPLASTY Right 04/2007   WISDOM TOOTH EXTRACTION     Patient Active Problem List   Diagnosis Date Noted   Abdominal pain 07/05/2021   H/O total hip arthroplasty, right 07/12/2020   Status post left partial knee replacement 07/12/2020   Nausea 03/31/2020   Multiple thyroid nodules 12/28/2019   Thyroid nodule 09/29/2019   Lymphedema 11/10/2018   PAD (peripheral artery disease) (HCC) 10/12/2018   Aortic atherosclerosis (HCC) 10/12/2018   Carotid stenosis, asymptomatic, bilateral 10/12/2018   Positive colorectal cancer screening using Cologuard test 10/09/2018   Gastroesophageal  reflux disease    Acute gastritis without hemorrhage    Osteoarthritis 04/29/2018   Hiatal hernia with GERD 04/29/2018   Hyperlipidemia 04/29/2018   Lymphedema of both lower extremities 04/29/2018   Left knee pain 10/23/2016   Cataracts, bilateral 07/31/2016   Glaucoma 07/31/2016   Lateral epicondylitis of left elbow 10/20/2015   Vitamin D deficiency 10/20/2015   Type 2 diabetes, controlled, with neuropathy (HCC) 10/16/2015   Type II diabetes mellitus with complication (HCC) 10/16/2015   Gouty arthritis 06/03/2015   H/O syncope 06/03/2015   Mild intermittent asthma 06/03/2015   Multiple gastric ulcers 06/03/2015   Schatzki's ring 06/03/2015   Undifferentiated connective tissue disease (HCC)  06/03/2015   Bone disorder 04/05/2015   Dental injury 04/05/2015   Pericarditis 04/05/2015   Sjogren's syndrome (HCC) 09/07/2014   Elevated alkaline phosphatase level 08/24/2014   Lactose intolerance 08/24/2014   Obesity 08/24/2014   Peripheral edema 08/24/2014   Benign 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 Judithann Sheen  REFERRING PROVIDER: Dr. Judithann Sheen  REFERRING DIAG: Ataxia  THERAPY DIAG:  Muscle weakness (generalized)  Chronic pain syndrome  Difficulty in walking, not elsewhere classified  Rationale for Evaluation and Treatment: Rehabilitation  ONSET DATE: June 2024  SUBJECTIVE: From initial evaluation  SUBJECTIVE STATEMENT: Pt says no pain today. Her painful areas only hurt with increased activity, but improve with rest. No other updates.   PERTINENT HISTORY: She reports she went on a boat at [facility] in June, 2024; a few days later she had an "episode" where "everything hit her"; she describes it as being painful in her back, hips, and legs.  She reports there was no falls or any specific injury or incident that happened while on the boat that day. She had a f/u with her PCP after this "episode" for her annual wellness visit and mentioned it; she states she was referred to PT.  She has also been to chiropractor a few visits (not sure of tx) and it was "helpful" (not clear how or what was actually helpful). She feels like she is having trouble walking sometimes since this episode in June.  Overall, ISQ- not  worsening or improving. She feels like she has difficulty walking up/down steps and walking around her house. Avn L hip, medial L knee replacement, 2 arthroscopic surgeries on R knee, R THA; no lumbar spine surgeries; no major ankle surgeries  PAIN:  Are you having pain? No   PRECAUTIONS: Fall   WEIGHT BEARING RESTRICTIONS: No  FALLS:  Has patient fallen in last 6 months? No  PATIENT GOALS: "to get her mobility back"; to move around better without being limited by her back, hip pain  NEXT MD VISIT: unknown   TODAY'S TREATMENT 01/21/23 Subjective: Pt has no new complaints upon arrival; she is feeling soreness in her hips upon arrival.  Objective: BP upon arrival: seated (L UE) 159/78, HR 72 BPM  -Nustep: seat 11, arms 11, level 1, SPM  ad lib (40s), 10 minutes  -STS from chair + airex, hands free 1x10  -  seated marching, alternating pattern 1x20  -seated rainbow ball squeeze at the knees 10x5secH  -seated LAQ 1x15 bilat  -seated greenTB clams 1x15, rainbow ball betwixt feet for joint spacing -lateral side stepping in bars 3RT    PATIENT EDUCATION:  Education details: Nustep supposed to feel good, not bad.   HOME EXERCISE PROGRAM: None given  ASSESSMENT:  CLINICAL IMPRESSION: Commenced with AA/ROM, gentle strengthening. Pt arrives and leaves without pain complaint. Detailed cues given for specific attention to detail for details of exercises performed. Pt will benefit from skilled PT to maximize return to PLOF.   OBJECTIVE IMPAIRMENTS: Abnormal gait, cardiopulmonary status limiting activity, decreased activity tolerance, decreased balance, decreased endurance, decreased mobility, difficulty walking, decreased ROM, decreased strength, and pain.   ACTIVITY LIMITATIONS: carrying, lifting, bending, squatting, sleeping, stairs, continence, and locomotion level  PARTICIPATION LIMITATIONS: meal prep, cleaning, laundry, and community activity  PERSONAL FACTORS: Age, Behavior  pattern, Past/current experiences, Time since onset of injury/illness/exacerbation, and 3+ comorbidities: cardiac, autoimmune, DM, R THA, L hip AVN, R knee OA, lymphedema, see PMH above  are also affecting patient's functional outcome.   REHAB POTENTIAL: Good  CLINICAL DECISION MAKING: Evolving/moderate complexity  EVALUATION COMPLEXITY: Moderate   GOALS: Goals reviewed with patient? Yes  SHORT TERM GOALS: Target date: 01/24/23 Pt will be able to perform HEP for LE strengthening 3x/week Baseline: to be initiated at future visit Goal status: INITIAL   LONG TERM GOALS: Target date: 02/22/23  Improve FOTO to predicted DC level indicating improved ability to perform her daily activities without being limited by chronic pain Baseline: to be administered at visit #2 Goal status: INITIAL  2.  Pt will be able to participate in 15 minutes therapeutic exercise for LE strength and balance at RPE level 4-6 without a seated rest break to improve her overall ability to perform her daily activities at home that require standing (kitchen/meal prep/chores)without being limited by chronic pain Baseline: 3-5 minutes Goal status: INITIAL  3.  Improve 5x STS to <20 seconds indicating improved LE strength and reduced fall risk Baseline: 31(with use of UE support) Goal status: INITIAL   PLAN:  PT FREQUENCY: 2x/week  PT DURATION: 6 weeks  PLANNED INTERVENTIONS: Therapeutic exercises, Therapeutic activity, Neuromuscular re-education, Balance training, Gait training, Patient/Family education, Self Care, and Joint mobilization  PLAN FOR NEXT SESSION: functional LE strength/balance/gait training.    Ardine Bjork, PT 01/21/2023, 2:39 PM   2:39 PM, 01/21/23 Rosamaria Lints, PT, DPT Physical Therapist - [facility] in [phone] (Office)

## 2023-01-22 NOTE — Therapy (Signed)
OUTPATIENT PHYSICAL THERAPY LOWER TREATMENT   Patient Name: [name] MRN: [id number] DOB:1945/07/29, 77 y.o., female Today's Date: 01/22/2023  END OF SESSION:  PT End of Session - 01/21/23 1424     Visit Number 3    Number of Visits 10    Date for PT Re-Evaluation 02/22/23    Authorization Type Medicare; 2x/week x 4-6 weeks (02/22/23)    Progress Note Due on Visit 10    PT Start Time 1425    PT Stop Time 1500    PT Time Calculation (min) 35 min    Activity Tolerance Patient tolerated treatment well    Behavior During Therapy WFL for tasks assessed/performed             Past Medical History:  Diagnosis Date   Anterolisthesis    Cervical spine   Aortic arch aneurysm (HCC)    Asthma    Connective tissue disorder (HCC)    recurrent carotid arteritis, temporal arteritis, vasculitis mandible, general hepatitis, avascular necrosis bilat   DDD (degenerative disc disease), lumbar    Diabetes mellitus without complication (HCC)    Diverticulosis    Duodenitis    Ganglion cyst of right foot    Gouty arthritis    Hiatal hernia with GERD    History of cardiovascular stress test    a. 07/2018 [facility]): Fixed inferoapical defect w/ nl contraction-->attenuation artifact. No ischemia. EF 63%.   Hyperlipidemia    Hypertension    a. 02/2019 Renal artery duplex: no evidence of prox RAS.   Hyperuricemia    Keratitis sicca, bilateral (HCC)    Lymphedema of both lower extremities    Osteoarthritis    Pericarditis    Recurrent: Aug 97, July 05, Sept 08, July 16, July 18   PUD (peptic ulcer disease)    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HCC)    Sjogren's syndrome (HCC)    Syncope    Tendonitis, Achilles, right    Tortuous colon    Trigger finger    Past Surgical History:  Procedure Laterality Date   ABDOMINAL HYSTERECTOMY     BREAST BIOPSY     BREAST EXCISIONAL BIOPSY Left 1986   neg   CHOLECYSTECTOMY     COLONOSCOPY WITH PROPOFOL N/A 08/26/2018   Procedure: COLONOSCOPY WITH  PROPOFOL;  Surgeon: Midge Minium, MD;  Location: [facility] ENDOSCOPY;  Service: Endoscopy;  Laterality: N/A;   CYSTOSCOPY  02/26/2018   Anola Gurney, MD    distal arthrectomy     ESOPHAGOGASTRODUODENOSCOPY (EGD) WITH PROPOFOL N/A 08/26/2018   Procedure: ESOPHAGOGASTRODUODENOSCOPY (EGD) WITH PROPOFOL;  Surgeon: Midge Minium, MD;  Location: [facility] ENDOSCOPY;  Service: Endoscopy;  Laterality: N/A;   EXCISION NEUROMA     KNEE SURGERY Bilateral    1985, 2001, 11/2002, 12/2006   panhysterectomy  10/1983   SHOULDER SURGERY Right    reverse total arthroplasty w/ biceps tenodesis   TONSILLECTOMY AND ADENOIDECTOMY     TOTAL HIP ARTHROPLASTY Right 04/2007   WISDOM TOOTH EXTRACTION     Patient Active Problem List   Diagnosis Date Noted   Abdominal pain 07/05/2021   H/O total hip arthroplasty, right 07/12/2020   Status post left partial knee replacement 07/12/2020   Nausea 03/31/2020   Multiple thyroid nodules 12/28/2019   Thyroid nodule 09/29/2019   Lymphedema 11/10/2018   PAD (peripheral artery disease) (HCC) 10/12/2018   Aortic atherosclerosis (HCC) 10/12/2018   Carotid stenosis, asymptomatic, bilateral 10/12/2018   Positive colorectal cancer screening using Cologuard test 10/09/2018   Gastroesophageal  reflux disease    Acute gastritis without hemorrhage    Osteoarthritis 04/29/2018   Hiatal hernia with GERD 04/29/2018   Hyperlipidemia 04/29/2018   Lymphedema of both lower extremities 04/29/2018   Left knee pain 10/23/2016   Cataracts, bilateral 07/31/2016   Glaucoma 07/31/2016   Lateral epicondylitis of left elbow 10/20/2015   Vitamin D deficiency 10/20/2015   Type 2 diabetes, controlled, with neuropathy (HCC) 10/16/2015   Type II diabetes mellitus with complication (HCC) 10/16/2015   Gouty arthritis 06/03/2015   H/O syncope 06/03/2015   Mild intermittent asthma 06/03/2015   Multiple gastric ulcers 06/03/2015   Schatzki's ring 06/03/2015   Undifferentiated connective tissue disease (HCC)  06/03/2015   Bone disorder 04/05/2015   Dental injury 04/05/2015   Pericarditis 04/05/2015   Sjogren's syndrome (HCC) 09/07/2014   Elevated alkaline phosphatase level 08/24/2014   Lactose intolerance 08/24/2014   Obesity 08/24/2014   Peripheral edema 08/24/2014   Benign 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 Judithann Sheen  REFERRING PROVIDER: Dr. Judithann Sheen  REFERRING DIAG: Ataxia  THERAPY DIAG:  Muscle weakness (generalized)  Chronic pain syndrome  Difficulty in walking, not elsewhere classified  Balance problem  Rationale for Evaluation and Treatment: Rehabilitation  ONSET DATE: June 2024  SUBJECTIVE: From initial evaluation  SUBJECTIVE STATEMENT: Pt says no pain today. Her painful areas only hurt with increased activity, but improve with rest. No other updates.   PERTINENT HISTORY: She reports she went on a boat at [facility] in June, 2024; a few days later she had an "episode" where "everything hit her"; she describes it as being painful in her back, hips, and legs.  She reports there was no falls or any specific injury or incident that happened while on the boat that day. She had a f/u with her PCP after this "episode" for her annual wellness visit and mentioned it; she states she was referred to PT.  She has also been to chiropractor a few visits (not sure of tx) and it was "helpful" (not clear how or what was actually helpful). She feels like she is having trouble walking sometimes since this episode in June.   Overall, ISQ- not worsening or improving. She feels like she has difficulty walking up/down steps and walking around her house. Avn L hip, medial L knee replacement, 2 arthroscopic surgeries on R knee, R THA; no lumbar spine surgeries; no major ankle surgeries  PAIN:  Are you having pain? No   PRECAUTIONS: Fall   WEIGHT BEARING RESTRICTIONS: No  FALLS:  Has patient fallen in last 6 months? No  PATIENT GOALS: "to get her mobility back"; to move around better without being limited by her back, hip pain  NEXT MD VISIT: unknown   TODAY'S TREATMENT 01/22/23 Subjective: Pt has no new complaints upon arrival; she is feeling soreness in her hips upon arrival.  Arrived late to session, abbreviated tx today:  Objective: BP upon arrival: seated (L UE) 159/78, HR 72 BPM  Therapeutic Exercises: Nustep: seat 11, arms 11, level 2, for LE/lumbopelvic AROM, during  hx taking today (5 min unbilled)  STS from chair + airex, hands free 1x10   lateral side stepping in bars 3 laps  Standing hip abduction, focusing on upright trunk posture, verbal cues/mirror visual cues: 2x10 R and LE LE  Front step ups onto blue airex: in parallel bars, single UE support, x15 R LE, x10 L LE  Seated blue physioball roll out for lumbar flex/self traction x10 at end of session  Not today: -seated marching, alternating pattern 1x20  -seated rainbow ball squeeze at the knees 10x5secH  -seated LAQ 1x15 bilat  -seated greenTB clams 1x15, rainbow ball betwixt feet for joint spacing  PATIENT EDUCATION:  Education details: Nustep supposed to feel good, not bad.   HOME EXERCISE PROGRAM: None given  ASSESSMENT:  CLINICAL IMPRESSION: Pt amb with improved upright trunk posture at end of session compared to arrival to session.  Able to perform therapeutic exercises for LE strength with frequent verbal cues for technique, and redirection to stay on task.  Pt's blood pressure reading is elevated today; let pt know and  continued with session as it was in safe range for exercise; pt states she is aware and di ont take her BP med this morning prior to PT.  Pt will benefit from skilled PT to maximize return to PLOF.   OBJECTIVE IMPAIRMENTS: Abnormal gait, cardiopulmonary status limiting activity, decreased activity tolerance, decreased balance, decreased endurance, decreased mobility, difficulty walking, decreased ROM, decreased strength, and pain.   ACTIVITY LIMITATIONS: carrying, lifting, bending, squatting, sleeping, stairs, continence, and locomotion level  PARTICIPATION LIMITATIONS: meal prep, cleaning, laundry, and community activity  PERSONAL FACTORS: Age, Behavior pattern, Past/current experiences, Time since onset of injury/illness/exacerbation, and 3+ comorbidities: cardiac, autoimmune, DM, R THA, L hip AVN, R knee OA, lymphedema, see PMH above  are also affecting patient's functional outcome.   REHAB POTENTIAL: Good  CLINICAL DECISION MAKING: Evolving/moderate complexity  EVALUATION COMPLEXITY: Moderate   GOALS: Goals reviewed with patient? Yes  SHORT TERM GOALS: Target date: 01/24/23 Pt will be able to perform HEP for LE strengthening 3x/week Baseline: to be initiated at future visit Goal status: INITIAL   LONG TERM GOALS: Target date: 02/22/23  Improve FOTO to predicted DC level indicating improved ability to perform her daily activities without being limited by chronic pain Baseline: to be administered at visit #2 Goal status: INITIAL  2.  Pt will be able to participate in 15 minutes therapeutic exercise for LE strength and balance at RPE level 4-6 without a seated rest break to improve her overall ability to perform her daily activities at home that require standing (kitchen/meal prep/chores)without being limited by chronic pain Baseline: 3-5 minutes Goal status: INITIAL  3.  Improve 5x STS to <20 seconds indicating improved LE strength and reduced fall risk Baseline: 31(with use of  UE support) Goal status: INITIAL   PLAN:  PT FREQUENCY: 2x/week  PT DURATION: 6 weeks  PLANNED INTERVENTIONS: Therapeutic exercises, Therapeutic activity, Neuromuscular re-education, Balance training, Gait training, Patient/Family education, Self Care, and Joint mobilization  PLAN FOR NEXT SESSION: functional LE strength/balance/gait training.  Max Fickle, PT, DPT, OCS Ardine Bjork, PT 01/22/2023, 11:18 AM   11:18 AM, 01/22/23

## 2023-01-23 ENCOUNTER — Ambulatory Visit: Payer: Medicare Other

## 2023-01-24 ENCOUNTER — Ambulatory Visit
Admission: RE | Admit: 2023-01-24 | Discharge: 2023-01-24 | Disposition: A | Discharge status: Home / Self Care | Payer: Medicare Other | Source: Ambulatory Visit | Attending: Internal Medicine | Admitting: Internal Medicine

## 2023-01-24 DIAGNOSIS — Z1231 Encounter for screening mammogram for malignant neoplasm of breast: Secondary | ICD-10-CM | POA: Insufficient documentation

## 2023-01-28 ENCOUNTER — Ambulatory Visit: Payer: Medicare Other

## 2023-01-28 DIAGNOSIS — M6281 Muscle weakness (generalized): Secondary | ICD-10-CM | POA: Diagnosis not present

## 2023-01-28 DIAGNOSIS — R262 Difficulty in walking, not elsewhere classified: Secondary | ICD-10-CM

## 2023-01-28 DIAGNOSIS — G894 Chronic pain syndrome: Secondary | ICD-10-CM

## 2023-01-28 NOTE — Therapy (Signed)
OUTPATIENT PHYSICAL THERAPY LOWER TREATMENT   Patient Name: [name] MRN: [id number] DOB:1945/12/07, 77 y.o., female Today's Date: 01/29/2023  END OF SESSION:  PT End of Session - 01/28/23 1414     Visit Number 4    Number of Visits 10    Date for PT Re-Evaluation 02/22/23    Authorization Type Medicare; 2x/week x 4-6 weeks (02/22/23)    Progress Note Due on Visit 10    PT Start Time 1415    PT Stop Time 1500    PT Time Calculation (min) 45 min    Activity Tolerance Patient tolerated treatment well    Behavior During Therapy WFL for tasks assessed/performed             Past Medical History:  Diagnosis Date   Anterolisthesis    Cervical spine   Aortic arch aneurysm (HCC)    Asthma    Connective tissue disorder (HCC)    recurrent carotid arteritis, temporal arteritis, vasculitis mandible, general hepatitis, avascular necrosis bilat   DDD (degenerative disc disease), lumbar    Diabetes mellitus without complication (HCC)    Diverticulosis    Duodenitis    Ganglion cyst of right foot    Gouty arthritis    Hiatal hernia with GERD    History of cardiovascular stress test    a. 07/2018 [facility]): Fixed inferoapical defect w/ nl contraction-->attenuation artifact. No ischemia. EF 63%.   Hyperlipidemia    Hypertension    a. 02/2019 Renal artery duplex: no evidence of prox RAS.   Hyperuricemia    Keratitis sicca, bilateral (HCC)    Lymphedema of both lower extremities    Osteoarthritis    Pericarditis    Recurrent: Aug 97, July 05, Sept 08, July 16, July 18   PUD (peptic ulcer disease)    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HCC)    Sjogren's syndrome (HCC)    Syncope    Tendonitis, Achilles, right    Tortuous colon    Trigger finger    Past Surgical History:  Procedure Laterality Date   ABDOMINAL HYSTERECTOMY     BREAST BIOPSY     BREAST EXCISIONAL BIOPSY Left 1986   neg   CHOLECYSTECTOMY     COLONOSCOPY WITH PROPOFOL N/A 08/26/2018   Procedure: COLONOSCOPY WITH  PROPOFOL;  Surgeon: Midge Minium, MD;  Location: [facility] ENDOSCOPY;  Service: Endoscopy;  Laterality: N/A;   CYSTOSCOPY  02/26/2018   Anola Gurney, MD    distal arthrectomy     ESOPHAGOGASTRODUODENOSCOPY (EGD) WITH PROPOFOL N/A 08/26/2018   Procedure: ESOPHAGOGASTRODUODENOSCOPY (EGD) WITH PROPOFOL;  Surgeon: Midge Minium, MD;  Location: [facility] ENDOSCOPY;  Service: Endoscopy;  Laterality: N/A;   EXCISION NEUROMA     KNEE SURGERY Bilateral    1985, 2001, 11/2002, 12/2006   panhysterectomy  10/1983   SHOULDER SURGERY Right    reverse total arthroplasty w/ biceps tenodesis   TONSILLECTOMY AND ADENOIDECTOMY     TOTAL HIP ARTHROPLASTY Right 04/2007   WISDOM TOOTH EXTRACTION     Patient Active Problem List   Diagnosis Date Noted   Abdominal pain 07/05/2021   H/O total hip arthroplasty, right 07/12/2020   Status post left partial knee replacement 07/12/2020   Nausea 03/31/2020   Multiple thyroid nodules 12/28/2019   Thyroid nodule 09/29/2019   Lymphedema 11/10/2018   PAD (peripheral artery disease) (HCC) 10/12/2018   Aortic atherosclerosis (HCC) 10/12/2018   Carotid stenosis, asymptomatic, bilateral 10/12/2018   Positive colorectal cancer screening using Cologuard test 10/09/2018   Gastroesophageal  reflux disease    Acute gastritis without hemorrhage    Osteoarthritis 04/29/2018   Hiatal hernia with GERD 04/29/2018   Hyperlipidemia 04/29/2018   Lymphedema of both lower extremities 04/29/2018   Left knee pain 10/23/2016   Cataracts, bilateral 07/31/2016   Glaucoma 07/31/2016   Lateral epicondylitis of left elbow 10/20/2015   Vitamin D deficiency 10/20/2015   Type 2 diabetes, controlled, with neuropathy (HCC) 10/16/2015   Type II diabetes mellitus with complication (HCC) 10/16/2015   Gouty arthritis 06/03/2015   H/O syncope 06/03/2015   Mild intermittent asthma 06/03/2015   Multiple gastric ulcers 06/03/2015   Schatzki's ring 06/03/2015   Undifferentiated connective tissue disease (HCC)  06/03/2015   Bone disorder 04/05/2015   Dental injury 04/05/2015   Pericarditis 04/05/2015   Sjogren's syndrome (HCC) 09/07/2014   Elevated alkaline phosphatase level 08/24/2014   Lactose intolerance 08/24/2014   Obesity 08/24/2014   Peripheral edema 08/24/2014   Benign 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 Judithann Sheen  REFERRING PROVIDER: Dr. Judithann Sheen  REFERRING DIAG: Ataxia  THERAPY DIAG:  Muscle weakness (generalized)  Chronic pain syndrome  Difficulty in walking, not elsewhere classified  Rationale for Evaluation and Treatment: Rehabilitation  ONSET DATE: June 2024  SUBJECTIVE: From initial evaluation  SUBJECTIVE STATEMENT: Pt says no pain today. Her painful areas only hurt with increased activity, but improve with rest. No other updates.   PERTINENT HISTORY: She reports she went on a boat at [facility] in June, 2024; a few days later she had an "episode" where "everything hit her"; she describes it as being painful in her back, hips, and legs.  She reports there was no falls or any specific injury or incident that happened while on the boat that day. She had a f/u with her PCP after this "episode" for her annual wellness visit and mentioned it; she states she was referred to PT.  She has also been to chiropractor a few visits (not sure of tx) and it was "helpful" (not clear how or what was actually helpful). She feels like she is having trouble walking sometimes since this episode in June.  Overall, ISQ- not  worsening or improving. She feels like she has difficulty walking up/down steps and walking around her house. Avn L hip, medial L knee replacement, 2 arthroscopic surgeries on R knee, R THA; no lumbar spine surgeries; no major ankle surgeries  PAIN:  Are you having pain? No   PRECAUTIONS: Fall   WEIGHT BEARING RESTRICTIONS: No  FALLS:  Has patient fallen in last 6 months? No  PATIENT GOALS: "to get her mobility back"; to move around better without being limited by her back, hip pain  NEXT MD VISIT: unknown   TODAY'S TREATMENT 01/29/23 Subjective: Pt states her L hip is bothering her really bad this week.  She notices it when she walks or goes up a step with her L LE.  Low back feels tight.  Objective:  Manual Therapy: Manual gentle long axis traction for pain control as pt notes this is helpful: 10  second bouts, repeated x 5 min in various positions PROM hip flexion, abd, add, long axis IR/ER x10 ea, 2 rounds  Therapeutic Exercises: Nustep: seat 11, arms 11, level 2, for LE/lumbopelvic AROM, during hx taking today (5 min unbilled)- not today  STS from chair + airex, hands free 1x10   SAQ: 3# 2x10  Supine hooklying hip add with ball between knees: 2x10  Supine hip abd with green TB: 2x10  Hooklying LTR: x10 ea direction  Seated blue physioball roll out for lumbar flex/self traction 2x10 at end of session; PT stabilization of pelvis during  HEP instruction: Access Code: [email or web address] URL: [email or web address] Date: 01/28/2023 Prepared by: Max Fickle  Exercises - Supine Lower Trunk Rotation with PLB  - 1 x daily - 7 x weekly - 2 sets - 10 reps - Supine Hip Adduction Isometric with Ball  - 1 x daily - 7 x weekly - 2 sets - 10 reps - Sit to Stand  - 1 x daily - 7 x weekly - 2 sets - 5 reps   Not today: -seated marching, alternating pattern 1x20  -seated rainbow ball squeeze at the knees 10x5secH  -seated LAQ 1x15 bilat  -seated greenTB clams 1x15,  rainbow ball betwixt feet for joint spacing -lateral side stepping in bars 3 laps -Standing hip abduction, focusing on upright trunk posture, verbal cues/mirror visual cues: 2x10 R and LE LE -Front step ups onto blue airex: in parallel bars, single UE support, x15 R LE, x10 L LE  PATIENT EDUCATION:  Education details: HEP instruction, exercise technique/form  HOME EXERCISE PROGRAM: Access Code: [email or web address]  ASSESSMENT:  CLINICAL IMPRESSION: Pt tolerated manual therapy and gentle LE strengthening in non weight bearing positions well today, she reports she was able to sit and stand with less discomfort in L hip at end of session.  Initiated a HEP today.  Lumbar spine remains hypomobile.  Pt will benefit from skilled PT to maximize return to PLOF.   OBJECTIVE IMPAIRMENTS: Abnormal gait, cardiopulmonary status limiting activity, decreased activity tolerance, decreased balance, decreased endurance, decreased mobility, difficulty walking, decreased ROM, decreased strength, and pain.   ACTIVITY LIMITATIONS: carrying, lifting, bending, squatting, sleeping, stairs, continence, and locomotion level  PARTICIPATION LIMITATIONS: meal prep, cleaning, laundry, and community activity  PERSONAL FACTORS: Age, Behavior pattern, Past/current experiences, Time since onset of injury/illness/exacerbation, and 3+ comorbidities: cardiac, autoimmune, DM, R THA, L hip AVN, R knee OA, lymphedema, see PMH above  are also affecting patient's functional outcome.   REHAB POTENTIAL: Good  CLINICAL DECISION MAKING: Evolving/moderate complexity  EVALUATION COMPLEXITY: Moderate   GOALS: Goals reviewed with patient? Yes  SHORT TERM GOALS: Target date: 01/24/23 Pt will be able to perform HEP for LE strengthening 3x/week Baseline: to be initiated at future visit Goal status: INITIAL   LONG TERM GOALS: Target date: 02/22/23  Improve FOTO to predicted DC level indicating improved ability to perform her daily activities  without being limited by chronic pain Baseline: to be administered at visit #2 Goal status: INITIAL  2.  Pt will be able to participate in 15 minutes therapeutic exercise for LE strength and balance at RPE level 4-6 without a seated rest break to improve her overall ability to perform her daily activities at home that require standing (kitchen/meal prep/chores)without being limited by chronic pain Baseline: 3-5 minutes Goal status: INITIAL  3.  Improve 5x STS to <20 seconds indicating improved LE strength and reduced fall risk Baseline: 31(with use of UE support) Goal status: INITIAL  PLAN:  PT FREQUENCY: 2x/week  PT DURATION: 6 weeks  PLANNED INTERVENTIONS: Therapeutic exercises, Therapeutic activity, Neuromuscular re-education, Balance training, Gait training, Patient/Family education, Self Care, and Joint mobilization  PLAN FOR NEXT SESSION: functional LE strength/balance/gait training.  Max Fickle, PT, DPT, OCS Ardine Bjork, PT 01/29/2023, 11:15 AM   11:15 AM, 01/29/23

## 2023-01-30 ENCOUNTER — Ambulatory Visit: Payer: Medicare Other

## 2023-01-30 DIAGNOSIS — R262 Difficulty in walking, not elsewhere classified: Secondary | ICD-10-CM

## 2023-01-30 DIAGNOSIS — G894 Chronic pain syndrome: Secondary | ICD-10-CM

## 2023-01-30 DIAGNOSIS — M6281 Muscle weakness (generalized): Secondary | ICD-10-CM | POA: Diagnosis not present

## 2023-01-30 NOTE — Therapy (Signed)
OUTPATIENT PHYSICAL THERAPY LOWER TREATMENT   Patient Name: [name] MRN: [id number] DOB:May 03, 1946, 77 y.o., female Today's Date: 01/31/2023  END OF SESSION:  PT End of Session - 01/30/23 1402     Visit Number 5    Number of Visits 10    Date for PT Re-Evaluation 02/22/23    Authorization Type Medicare; 2x/week x 4-6 weeks (02/22/23)    Progress Note Due on Visit 10    PT Start Time 1415    PT Stop Time 1500    PT Time Calculation (min) 45 min    Activity Tolerance Patient tolerated treatment well    Behavior During Therapy WFL for tasks assessed/performed              Past Medical History:  Diagnosis Date   Anterolisthesis    Cervical spine   Aortic arch aneurysm (HCC)    Asthma    Connective tissue disorder (HCC)    recurrent carotid arteritis, temporal arteritis, vasculitis mandible, general hepatitis, avascular necrosis bilat   DDD (degenerative disc disease), lumbar    Diabetes mellitus without complication (HCC)    Diverticulosis    Duodenitis    Ganglion cyst of right foot    Gouty arthritis    Hiatal hernia with GERD    History of cardiovascular stress test    a. 07/2018 [facility]): Fixed inferoapical defect w/ nl contraction-->attenuation artifact. No ischemia. EF 63%.   Hyperlipidemia    Hypertension    a. 02/2019 Renal artery duplex: no evidence of prox RAS.   Hyperuricemia    Keratitis sicca, bilateral (HCC)    Lymphedema of both lower extremities    Osteoarthritis    Pericarditis    Recurrent: Aug 97, July 05, Sept 08, July 16, July 18   PUD (peptic ulcer disease)    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HCC)    Sjogren's syndrome (HCC)    Syncope    Tendonitis, Achilles, right    Tortuous colon    Trigger finger    Past Surgical History:  Procedure Laterality Date   ABDOMINAL HYSTERECTOMY     BREAST BIOPSY     BREAST EXCISIONAL BIOPSY Left 1986   neg   CHOLECYSTECTOMY     COLONOSCOPY WITH PROPOFOL N/A 08/26/2018   Procedure: COLONOSCOPY  WITH PROPOFOL;  Surgeon: Midge Minium, MD;  Location: [facility] ENDOSCOPY;  Service: Endoscopy;  Laterality: N/A;   CYSTOSCOPY  02/26/2018   Anola Gurney, MD    distal arthrectomy     ESOPHAGOGASTRODUODENOSCOPY (EGD) WITH PROPOFOL N/A 08/26/2018   Procedure: ESOPHAGOGASTRODUODENOSCOPY (EGD) WITH PROPOFOL;  Surgeon: Midge Minium, MD;  Location: [facility] ENDOSCOPY;  Service: Endoscopy;  Laterality: N/A;   EXCISION NEUROMA     KNEE SURGERY Bilateral    1985, 2001, 11/2002, 12/2006   panhysterectomy  10/1983   SHOULDER SURGERY Right    reverse total arthroplasty w/ biceps tenodesis   TONSILLECTOMY AND ADENOIDECTOMY     TOTAL HIP ARTHROPLASTY Right 04/2007   WISDOM TOOTH EXTRACTION     Patient Active Problem List   Diagnosis Date Noted   Abdominal pain 07/05/2021   H/O total hip arthroplasty, right 07/12/2020   Status post left partial knee replacement 07/12/2020   Nausea 03/31/2020   Multiple thyroid nodules 12/28/2019   Thyroid nodule 09/29/2019   Lymphedema 11/10/2018   PAD (peripheral artery disease) (HCC) 10/12/2018   Aortic atherosclerosis (HCC) 10/12/2018   Carotid stenosis, asymptomatic, bilateral 10/12/2018   Positive colorectal cancer screening using Cologuard test 10/09/2018  Gastroesophageal reflux disease    Acute gastritis without hemorrhage    Osteoarthritis 04/29/2018   Hiatal hernia with GERD 04/29/2018   Hyperlipidemia 04/29/2018   Lymphedema of both lower extremities 04/29/2018   Left knee pain 10/23/2016   Cataracts, bilateral 07/31/2016   Glaucoma 07/31/2016   Lateral epicondylitis of left elbow 10/20/2015   Vitamin D deficiency 10/20/2015   Type 2 diabetes, controlled, with neuropathy (HCC) 10/16/2015   Type II diabetes mellitus with complication (HCC) 10/16/2015   Gouty arthritis 06/03/2015   H/O syncope 06/03/2015   Mild intermittent asthma 06/03/2015   Multiple gastric ulcers 06/03/2015   Schatzki's ring 06/03/2015   Undifferentiated connective tissue disease  (HCC) 06/03/2015   Bone disorder 04/05/2015   Dental injury 04/05/2015   Pericarditis 04/05/2015   Sjogren's syndrome (HCC) 09/07/2014   Elevated alkaline phosphatase level 08/24/2014   Lactose intolerance 08/24/2014   Obesity 08/24/2014   Peripheral edema 08/24/2014   Benign 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 Judithann Sheen  REFERRING PROVIDER: Dr. Judithann Sheen  REFERRING DIAG: Ataxia  THERAPY DIAG:  Muscle weakness (generalized)  Chronic pain syndrome  Difficulty in walking, not elsewhere classified  Rationale for Evaluation and Treatment: Rehabilitation  ONSET DATE: June 2024  SUBJECTIVE: From initial evaluation  SUBJECTIVE STATEMENT: See initial evaluation  PERTINENT HISTORY: She reports she went on a boat at [facility] in June, 2024; a few days later she had an "episode" where "everything hit her"; she describes it as being painful in her back, hips, and legs.  She reports there was no falls or any specific injury or incident that happened while on the boat that day. She had a f/u with her PCP after this "episode" for her annual wellness visit and mentioned it; she states she was referred to PT.  She has also been to chiropractor a few visits (not sure of tx) and it was "helpful" (not clear how or what was actually helpful). She feels like she is having trouble walking sometimes since this episode in June.  Overall, ISQ- not worsening or improving. She feels like she has difficulty walking up/down steps and walking  around her house.   Aggravating factors: lying supine, prolonged standing and walking- bother her back and hip pain.   Alleviating factors: "resting"   No specific localized area of pain; describes generalized low back, L hip (has h/o AVN and is waiting for hip replacement), and leg pain.  She does not use any assistive device for ambulation.    PMH: Avn L hip, medial L knee replacement, 2 arthroscopic surgeries on R knee, R THA; no lumbar spine surgeries; no major ankle surgeries  PAIN:  Are you having pain? No   PRECAUTIONS: Fall   WEIGHT BEARING RESTRICTIONS: No  FALLS:  Has patient fallen in last 6 months? No  PATIENT GOALS: "to get her mobility back"; to move around better without being limited by her back, hip pain  NEXT MD VISIT: unknown   TODAY'S TREATMENT 01/31/23 Subjective: Pt states her L hip and back are sore  and tight upon arrival.  She did some housework before she came to PT.  She attributes her soreness to that.  She restates again today that her L hip AVN has been stable for years and hasn't progressed;   Objective: BP 157/79 upon arrival per pt request; pt then took her BP medication at the clinic bc she states she did not take it before PT and this is the usual time she takes it at home.  Manual Therapy: (for L hip/low back) Manual gentle long axis traction L LE for pain control as pt notes this is helpful: 10 second bouts, repeated x 5 min in various positions Gentle PROM hip flexion, abd, add within available ROM; long axis IR/ER x10 ea, 2 rounds  Therapeutic Exercises: (to promote lumbopelvic/hip ROM and strength)  STS from chair + airex, hands free 1x10- not today  SAQ: 3# 2x10 L, R 0# 2x10 (pt notes R knee too painful with weight)  Supine hooklying hip add with ball between knees: 2x10  Supine hip abd with blue TB: 2x10  Hooklying LTR: x10 ea direction, 2 sets (pt is very hypomobile today, improved with repetition)  Seated blue physioball roll  out for lumbar hypomobility 2x10, PT stabilization of pelvis during, focused on diaphragmatic breathing during for physiological quieting/pain control  Nustep: seat 11, arms 11, level 2, for LE/lumbopelvic AROM x 7 minutes at end of session today   HEP instruction: Access Code: [email or web address] URL: [email or web address] Date: 01/28/2023 Prepared by: Max Fickle  Exercises - Supine Lower Trunk Rotation with PLB  - 1 x daily - 7 x weekly - 2 sets - 10 reps - Supine Hip Adduction Isometric with Ball  - 1 x daily - 7 x weekly - 2 sets - 10 reps - Sit to Stand  - 1 x daily - 7 x weekly - 2 sets - 5 reps   Not today: -seated marching, alternating pattern 1x20  -seated rainbow ball squeeze at the knees 10x5secH  -seated LAQ 1x15 bilat  -seated greenTB clams 1x15, rainbow ball betwixt feet for joint spacing -lateral side stepping in bars 3 laps -Standing hip abduction, focusing on upright trunk posture, verbal cues/mirror visual cues: 2x10 R and LE LE -Front step ups onto blue airex: in parallel bars, single UE support, x15 R LE, x10 L LE  PATIENT EDUCATION:  Education details: HEP instruction, exercise technique/form  HOME EXERCISE PROGRAM: Access Code: [email or web address]  ASSESSMENT:  CLINICAL IMPRESSION: Pt able to amb with improved upright trunk posture and symmetrical stance time at end of session compared to when she arrived to PT.  She is very focused and concerned with her pain and notes that functionally it is limiting her ability to ascend/descend stairs into her hom.  She tolerated manual therapy and gentle LE strengthening in minimal weight bearing positions well today.  She does benefit from frequent redirection within session to stay on task; overall seemed pretty motivated to participate in session today.  Pt will benefit from skilled PT to maximize return to PLOF.   OBJECTIVE IMPAIRMENTS: Abnormal gait, cardiopulmonary status limiting activity, decreased activity  tolerance, decreased balance, decreased endurance, decreased mobility, difficulty walking, decreased ROM, decreased strength, and pain.   ACTIVITY LIMITATIONS: carrying, lifting, bending, squatting, sleeping, stairs, continence, and locomotion level  PARTICIPATION LIMITATIONS: meal prep, cleaning, laundry, and community activity  PERSONAL FACTORS: Age, Behavior pattern, Past/current experiences, Time since onset of injury/illness/exacerbation, and 3+ comorbidities: cardiac, autoimmune, DM, R THA, L hip AVN, R knee OA, lymphedema,  see PMH above  are also affecting patient's functional outcome.   REHAB POTENTIAL: Good  CLINICAL DECISION MAKING: Evolving/moderate complexity  EVALUATION COMPLEXITY: Moderate   GOALS: Goals reviewed with patient? Yes  SHORT TERM GOALS: Target date: 01/24/23 Pt will be able to perform HEP for LE strengthening 3x/week Baseline: to be initiated at future visit Goal status: INITIAL   LONG TERM GOALS: Target date: 02/22/23  Improve FOTO to predicted DC level indicating improved ability to perform her daily activities without being limited by chronic pain Baseline: to be administered at visit #2 Goal status: INITIAL  2.  Pt will be able to participate in 15 minutes therapeutic exercise for LE strength and balance at RPE level 4-6 without a seated rest break to improve her overall ability to perform her daily activities at home that require standing (kitchen/meal prep/chores)without being limited by chronic pain Baseline: 3-5 minutes Goal status: INITIAL  3.  Improve 5x STS to <20 seconds indicating improved LE strength and reduced fall risk Baseline: 31(with use of UE support) Goal status: INITIAL   PLAN:  PT FREQUENCY: 2x/week  PT DURATION: 6 weeks  PLANNED INTERVENTIONS: Therapeutic exercises, Therapeutic activity, Neuromuscular re-education, Balance training, Gait training, Patient/Family education, Self Care, and Joint mobilization  PLAN FOR NEXT  SESSION: functional LE strength/balance/gait training.  Max Fickle, PT, DPT, OCS Ardine Bjork, PT 01/31/2023, 2:16 PM   2:16 PM, 01/31/23

## 2023-02-13 ENCOUNTER — Ambulatory Visit: Payer: Medicare Other | Attending: Internal Medicine

## 2023-02-13 DIAGNOSIS — R262 Difficulty in walking, not elsewhere classified: Secondary | ICD-10-CM | POA: Diagnosis present

## 2023-02-13 DIAGNOSIS — M6281 Muscle weakness (generalized): Secondary | ICD-10-CM | POA: Insufficient documentation

## 2023-02-13 DIAGNOSIS — G894 Chronic pain syndrome: Secondary | ICD-10-CM | POA: Diagnosis present

## 2023-02-13 NOTE — Therapy (Signed)
OUTPATIENT PHYSICAL THERAPY LOWER TREATMENT   Patient Name: [name] MRN: [id number] DOB:Nov 17, 1945, 77 y.o., female Today's Date: 02/13/2023  END OF SESSION:  PT End of Session - 02/13/23 1416     Visit Number 6    Number of Visits 10    Date for PT Re-Evaluation 02/22/23    Authorization Type Medicare; 2x/week x 4-6 weeks (02/22/23)    Progress Note Due on Visit 10    PT Start Time 1415    PT Stop Time 1500    PT Time Calculation (min) 45 min    Activity Tolerance Patient tolerated treatment well    Behavior During Therapy WFL for tasks assessed/performed              Past Medical History:  Diagnosis Date   Anterolisthesis    Cervical spine   Aortic arch aneurysm (HCC)    Asthma    Connective tissue disorder (HCC)    recurrent carotid arteritis, temporal arteritis, vasculitis mandible, general hepatitis, avascular necrosis bilat   DDD (degenerative disc disease), lumbar    Diabetes mellitus without complication (HCC)    Diverticulosis    Duodenitis    Ganglion cyst of right foot    Gouty arthritis    Hiatal hernia with GERD    History of cardiovascular stress test    a. 07/2018 [facility]): Fixed inferoapical defect w/ nl contraction-->attenuation artifact. No ischemia. EF 63%.   Hyperlipidemia    Hypertension    a. 02/2019 Renal artery duplex: no evidence of prox RAS.   Hyperuricemia    Keratitis sicca, bilateral (HCC)    Lymphedema of both lower extremities    Osteoarthritis    Pericarditis    Recurrent: Aug 97, July 05, Sept 08, July 16, July 18   PUD (peptic ulcer disease)    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HCC)    Sjogren's syndrome (HCC)    Syncope    Tendonitis, Achilles, right    Tortuous colon    Trigger finger    Past Surgical History:  Procedure Laterality Date   ABDOMINAL HYSTERECTOMY     BREAST BIOPSY     BREAST EXCISIONAL BIOPSY Left 1986   neg   CHOLECYSTECTOMY     COLONOSCOPY WITH PROPOFOL N/A 08/26/2018   Procedure: COLONOSCOPY  WITH PROPOFOL;  Surgeon: Midge Minium, MD;  Location: [facility] ENDOSCOPY;  Service: Endoscopy;  Laterality: N/A;   CYSTOSCOPY  02/26/2018   Anola Gurney, MD    distal arthrectomy     ESOPHAGOGASTRODUODENOSCOPY (EGD) WITH PROPOFOL N/A 08/26/2018   Procedure: ESOPHAGOGASTRODUODENOSCOPY (EGD) WITH PROPOFOL;  Surgeon: Midge Minium, MD;  Location: [facility] ENDOSCOPY;  Service: Endoscopy;  Laterality: N/A;   EXCISION NEUROMA     KNEE SURGERY Bilateral    1985, 2001, 11/2002, 12/2006   panhysterectomy  10/1983   SHOULDER SURGERY Right    reverse total arthroplasty w/ biceps tenodesis   TONSILLECTOMY AND ADENOIDECTOMY     TOTAL HIP ARTHROPLASTY Right 04/2007   WISDOM TOOTH EXTRACTION     Patient Active Problem List   Diagnosis Date Noted   Abdominal pain 07/05/2021   H/O total hip arthroplasty, right 07/12/2020   Status post left partial knee replacement 07/12/2020   Nausea 03/31/2020   Multiple thyroid nodules 12/28/2019   Thyroid nodule 09/29/2019   Lymphedema 11/10/2018   PAD (peripheral artery disease) (HCC) 10/12/2018   Aortic atherosclerosis (HCC) 10/12/2018   Carotid stenosis, asymptomatic, bilateral 10/12/2018   Positive colorectal cancer screening using Cologuard test 10/09/2018  Gastroesophageal reflux disease    Acute gastritis without hemorrhage    Osteoarthritis 04/29/2018   Hiatal hernia with GERD 04/29/2018   Hyperlipidemia 04/29/2018   Lymphedema of both lower extremities 04/29/2018   Left knee pain 10/23/2016   Cataracts, bilateral 07/31/2016   Glaucoma 07/31/2016   Lateral epicondylitis of left elbow 10/20/2015   Vitamin D deficiency 10/20/2015   Type 2 diabetes, controlled, with neuropathy (HCC) 10/16/2015   Type II diabetes mellitus with complication (HCC) 10/16/2015   Gouty arthritis 06/03/2015   H/O syncope 06/03/2015   Mild intermittent asthma 06/03/2015   Multiple gastric ulcers 06/03/2015   Schatzki's ring 06/03/2015   Undifferentiated connective tissue disease  (HCC) 06/03/2015   Bone disorder 04/05/2015   Dental injury 04/05/2015   Pericarditis 04/05/2015   Sjogren's syndrome (HCC) 09/07/2014   Elevated alkaline phosphatase level 08/24/2014   Lactose intolerance 08/24/2014   Obesity 08/24/2014   Peripheral edema 08/24/2014   Benign 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 Judithann Sheen  REFERRING PROVIDER: Dr. Judithann Sheen  REFERRING DIAG: Ataxia  THERAPY DIAG:  Muscle weakness (generalized)  Chronic pain syndrome  Difficulty in walking, not elsewhere classified  Rationale for Evaluation and Treatment: Rehabilitation  ONSET DATE: June 2024  SUBJECTIVE: From initial evaluation  SUBJECTIVE STATEMENT: See initial evaluation  PERTINENT HISTORY: She reports she went on a boat at [facility] in June, 2024; a few days later she had an "episode" where "everything hit her"; she describes it as being painful in her back, hips, and legs.  She reports there was no falls or any specific injury or incident that happened while on the boat that day. She had a f/u with her PCP after this "episode" for her annual wellness visit and mentioned it; she states she was referred to PT.  She has also been to chiropractor a few visits (not sure of tx) and it was "helpful" (not clear how or what was actually helpful). She feels like she is having trouble walking sometimes since this episode in June.  Overall, ISQ- not worsening or improving. She feels like she has difficulty walking up/down steps and walking  around her house.   Aggravating factors: lying supine, prolonged standing and walking- bother her back and hip pain.   Alleviating factors: "resting"   No specific localized area of pain; describes generalized low back, L hip (has h/o AVN and is waiting for hip replacement), and leg pain.  She does not use any assistive device for ambulation.    PMH: Avn L hip, medial L knee replacement, 2 arthroscopic surgeries on R knee, R THA; no lumbar spine surgeries; no major ankle surgeries  PAIN:  Are you having pain? No   PRECAUTIONS: Fall   WEIGHT BEARING RESTRICTIONS: No  FALLS:  Has patient fallen in last 6 months? No  PATIENT GOALS: "to get her mobility back"; to move around better without being limited by her back, hip pain  NEXT MD VISIT: unknown  TODAY'S TREATMENT 02/13/23 Subjective: Pt states her L hip and back are sore and  tight upon arrival.  She did some housework before she came to PT.  Functionally she is having difficulty navigating stairs, especially going up when she is carrying items or grocery bags from the car.     Objective: BP: 155/67, HR 70 upon arrival; pt brought her medication and took it with her at start of session  Manual Therapy: (for L hip/low back) Manual gentle long axis traction L LE for pain control as pt notes this is helpful: 10 second bouts, repeated x 5 min in various positions Gentle PROM hip flexion, abd, add within available ROM; long axis IR/ER x10 ea, 2 rounds  Therapeutic Exercises: (to promote lumbopelvic/hip ROM and strength)  STS from chair + airex, hands free 2x6  SAQ: 3# 2x10 L, R 0# 2x10 with PT tactile cues and assist for concentric phase, pt slowly lower on eccentric phase   Supine hooklying hip add with PT manual assistance: 2x10   Supine hip abd with PT manual resistance: 2x10  Hooklying LTR: x10 ea direction, 2 sets (pt is very hypomobile today, improved with repetition)  Seated blue physioball roll out for lumbar  hypomobility 2x10, PT stabilization of pelvis during, focused on diaphragmatic breathing during for physiological quieting/pain control  Seated lumbar flexion with hands on thighs x 5  Front step ups: 5x leading with L LE and 5x leading with R LE with b/l UE use on railings; also observed pt ascend/descend 5 stairs with her self selected pattern using b/l railing   HEP instruction: Access Code: [email or web address] URL: [email or web address] Date: 01/28/2023 Prepared by: Max Fickle  Exercises - Supine Lower Trunk Rotation with PLB  - 1 x daily - 7 x weekly - 2 sets - 10 reps - Supine Hip Adduction Isometric with Ball  - 1 x daily - 7 x weekly - 2 sets - 10 reps - Sit to Stand  - 1 x daily - 7 x weekly - 2 sets - 5 reps   Not today: -seated marching, alternating pattern 1x20  -seated rainbow ball squeeze at the knees 10x5secH  -seated LAQ 1x15 bilat  -seated greenTB clams 1x15, rainbow ball betwixt feet for joint spacing -lateral side stepping in bars 3 laps -Standing hip abduction, focusing on upright trunk posture, verbal cues/mirror visual cues: 2x10 R and LE LE   PATIENT EDUCATION:  Education details: HEP instruction, exercise technique/form  HOME EXERCISE PROGRAM: Access Code: [email or web address]  ASSESSMENT:  CLINICAL IMPRESSION: Assessed pt's ability to functionally navigate stairs during today's session.  She is limited by decreased strength in R quadriceps and pain in L hip.  She is safer using a step-to gait pattern leading up with R LE, but lacks sufficient strength in R quads for push up on to step.  She is also safer using a step-to gait pattern descending the stairs leading down onto her R LE bc she lacks sufficient eccentric strength in R quadriceps.  She tolerated manual therapy and gentle LE strengthening in minimal weight bearing positions well today.  She does benefit from frequent redirection within session to stay on task; overall seemed pretty motivated to  participate in session today.  Pt will benefit from skilled PT to maximize return to PLOF.   OBJECTIVE IMPAIRMENTS: Abnormal gait, cardiopulmonary status limiting activity, decreased activity tolerance, decreased balance, decreased endurance, decreased mobility, difficulty walking, decreased ROM, decreased strength, and pain.   ACTIVITY LIMITATIONS: carrying, lifting, bending, squatting, sleeping, stairs, continence, and locomotion level  PARTICIPATION LIMITATIONS: meal prep, cleaning, laundry, and community activity  PERSONAL FACTORS: Age, Behavior pattern, Past/current experiences, Time since onset of injury/illness/exacerbation, and 3+ comorbidities: cardiac, autoimmune, DM, R THA, L hip AVN, R knee OA, lymphedema, see PMH above  are also affecting patient's functional outcome.   REHAB POTENTIAL: Good  CLINICAL DECISION MAKING: Evolving/moderate complexity  EVALUATION COMPLEXITY: Moderate   GOALS: Goals reviewed with patient? Yes  SHORT TERM GOALS: Target date: 01/24/23 Pt will be able to perform HEP for LE strengthening 3x/week Baseline: to be initiated at future visit Goal status: INITIAL   LONG TERM GOALS: Target date: 02/22/23  Improve FOTO to predicted DC level indicating improved ability to perform her daily activities without being limited by chronic pain Baseline: to be administered at visit #2 Goal status: INITIAL  2.  Pt will be able to participate in 15 minutes therapeutic exercise for LE strength and balance at RPE level 4-6 without a seated rest break to improve her overall ability to perform her daily activities at home that require standing (kitchen/meal prep/chores)without being limited by chronic pain Baseline: 3-5 minutes Goal status: INITIAL  3.  Improve 5x STS to <20 seconds indicating improved LE strength and reduced fall risk Baseline: 31(with use of UE support) Goal status: INITIAL   PLAN:  PT FREQUENCY: 2x/week  PT DURATION: 6 weeks  PLANNED  INTERVENTIONS: Therapeutic exercises, Therapeutic activity, Neuromuscular re-education, Balance training, Gait training, Patient/Family education, Self Care, and Joint mobilization  PLAN FOR NEXT SESSION: functional LE strength/balance/gait training.  Emphasize R quadriceps strengthening to stair navigation.  Max Fickle, PT, DPT, OCS Ardine Bjork, PT 02/13/2023, 3:57 PM   3:57 PM, 02/13/23

## 2023-02-18 ENCOUNTER — Ambulatory Visit: Payer: Medicare Other

## 2023-02-18 DIAGNOSIS — M6281 Muscle weakness (generalized): Secondary | ICD-10-CM | POA: Diagnosis not present

## 2023-02-18 DIAGNOSIS — G894 Chronic pain syndrome: Secondary | ICD-10-CM

## 2023-02-18 NOTE — Therapy (Signed)
OUTPATIENT PHYSICAL THERAPY LOWER TREATMENT   Patient Name: [name] MRN: [id number] DOB:1946/05/18, 77 y.o., female Today's Date: 02/18/2023  END OF SESSION:  PT End of Session - 02/18/23 1422     Visit Number 7    Number of Visits 10    Date for PT Re-Evaluation 02/22/23    Authorization Type Medicare; 2x/week x 4-6 weeks (02/22/23)    Progress Note Due on Visit 10    PT Start Time 1415    PT Stop Time 1500    PT Time Calculation (min) 45 min    Activity Tolerance Patient tolerated treatment well    Behavior During Therapy WFL for tasks assessed/performed              Past Medical History:  Diagnosis Date   Anterolisthesis    Cervical spine   Aortic arch aneurysm (HCC)    Asthma    Connective tissue disorder (HCC)    recurrent carotid arteritis, temporal arteritis, vasculitis mandible, general hepatitis, avascular necrosis bilat   DDD (degenerative disc disease), lumbar    Diabetes mellitus without complication (HCC)    Diverticulosis    Duodenitis    Ganglion cyst of right foot    Gouty arthritis    Hiatal hernia with GERD    History of cardiovascular stress test    a. 07/2018 [facility]): Fixed inferoapical defect w/ nl contraction-->attenuation artifact. No ischemia. EF 63%.   Hyperlipidemia    Hypertension    a. 02/2019 Renal artery duplex: no evidence of prox RAS.   Hyperuricemia    Keratitis sicca, bilateral (HCC)    Lymphedema of both lower extremities    Osteoarthritis    Pericarditis    Recurrent: Aug 97, July 05, Sept 08, July 16, July 18   PUD (peptic ulcer disease)    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HCC)    Sjogren's syndrome (HCC)    Syncope    Tendonitis, Achilles, right    Tortuous colon    Trigger finger    Past Surgical History:  Procedure Laterality Date   ABDOMINAL HYSTERECTOMY     BREAST BIOPSY     BREAST EXCISIONAL BIOPSY Left 1986   neg   CHOLECYSTECTOMY     COLONOSCOPY WITH PROPOFOL N/A 08/26/2018   Procedure: COLONOSCOPY  WITH PROPOFOL;  Surgeon: Midge Minium, MD;  Location: [facility] ENDOSCOPY;  Service: Endoscopy;  Laterality: N/A;   CYSTOSCOPY  02/26/2018   Anola Gurney, MD    distal arthrectomy     ESOPHAGOGASTRODUODENOSCOPY (EGD) WITH PROPOFOL N/A 08/26/2018   Procedure: ESOPHAGOGASTRODUODENOSCOPY (EGD) WITH PROPOFOL;  Surgeon: Midge Minium, MD;  Location: [facility] ENDOSCOPY;  Service: Endoscopy;  Laterality: N/A;   EXCISION NEUROMA     KNEE SURGERY Bilateral    1985, 2001, 11/2002, 12/2006   panhysterectomy  10/1983   SHOULDER SURGERY Right    reverse total arthroplasty w/ biceps tenodesis   TONSILLECTOMY AND ADENOIDECTOMY     TOTAL HIP ARTHROPLASTY Right 04/2007   WISDOM TOOTH EXTRACTION     Patient Active Problem List   Diagnosis Date Noted   Abdominal pain 07/05/2021   H/O total hip arthroplasty, right 07/12/2020   Status post left partial knee replacement 07/12/2020   Nausea 03/31/2020   Multiple thyroid nodules 12/28/2019   Thyroid nodule 09/29/2019   Lymphedema 11/10/2018   PAD (peripheral artery disease) (HCC) 10/12/2018   Aortic atherosclerosis (HCC) 10/12/2018   Carotid stenosis, asymptomatic, bilateral 10/12/2018   Positive colorectal cancer screening using Cologuard test 10/09/2018  Gastroesophageal reflux disease    Acute gastritis without hemorrhage    Osteoarthritis 04/29/2018   Hiatal hernia with GERD 04/29/2018   Hyperlipidemia 04/29/2018   Lymphedema of both lower extremities 04/29/2018   Left knee pain 10/23/2016   Cataracts, bilateral 07/31/2016   Glaucoma 07/31/2016   Lateral epicondylitis of left elbow 10/20/2015   Vitamin D deficiency 10/20/2015   Type 2 diabetes, controlled, with neuropathy (HCC) 10/16/2015   Type II diabetes mellitus with complication (HCC) 10/16/2015   Gouty arthritis 06/03/2015   H/O syncope 06/03/2015   Mild intermittent asthma 06/03/2015   Multiple gastric ulcers 06/03/2015   Schatzki's ring 06/03/2015   Undifferentiated connective tissue disease  (HCC) 06/03/2015   Bone disorder 04/05/2015   Dental injury 04/05/2015   Pericarditis 04/05/2015   Sjogren's syndrome (HCC) 09/07/2014   Elevated alkaline phosphatase level 08/24/2014   Lactose intolerance 08/24/2014   Obesity 08/24/2014   Peripheral edema 08/24/2014   Benign 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 Judithann Sheen  REFERRING PROVIDER: Dr. Judithann Sheen  REFERRING DIAG: Ataxia  THERAPY DIAG:  Muscle weakness (generalized)  Chronic pain syndrome  Rationale for Evaluation and Treatment: Rehabilitation  ONSET DATE: June 2024  SUBJECTIVE: From initial evaluation  SUBJECTIVE STATEMENT: See initial evaluation  PERTINENT HISTORY: She reports she went on a boat at [facility] in June, 2024; a few days later she had an "episode" where "everything hit her"; she describes it as being painful in her back, hips, and legs.  She reports there was no falls or any specific injury or incident that happened while on the boat that day. She had a f/u with her PCP after this "episode" for her annual wellness visit and mentioned it; she states she was referred to PT.  She has also been to chiropractor a few visits (not sure of tx) and it was "helpful" (not clear how or what was actually helpful). She feels like she is having trouble walking sometimes since this episode in June.  Overall, ISQ- not worsening or improving. She feels like she has difficulty walking up/down steps and walking around her house.   Aggravating factors: lying  supine, prolonged standing and walking- bother her back and hip pain.   Alleviating factors: "resting"   No specific localized area of pain; describes generalized low back, L hip (has h/o AVN and is waiting for hip replacement), and leg pain.  She does not use any assistive device for ambulation.    PMH: Avn L hip, medial L knee replacement, 2 arthroscopic surgeries on R knee, R THA; no lumbar spine surgeries; no major ankle surgeries  PAIN:  Are you having pain? No   PRECAUTIONS: Fall   WEIGHT BEARING RESTRICTIONS: No  FALLS:  Has patient fallen in last 6 months? No  PATIENT GOALS: "to get her mobility back"; to move around better without being limited by her back, hip pain  NEXT MD VISIT: unknown  TODAY'S TREATMENT 02/18/23 Subjective: Pt states she is having difficulty going up the stairs at her home because of her L  hip and R knee.  She felt pretty good after last PT session.      Objective:  Manual Therapy: (for L hip/low back) Manual gentle long axis traction L LE for pain control as pt notes this is helpful: 10 second bouts, repeated x 5 min in various positions Gentle PROM hip flexion, abd, add within available ROM; long axis IR/ER x10 ea, 2 rounds  Therapeutic Exercises: (to promote lumbopelvic/hip ROM and strength) Nustep: seat #12, level 3 x 10 min- for hip/knee AROM and strength (hx taking during this)  STS from chair + airex, hands free 2x6  Seated LAQ: 3# R LE 2x12  Seated hip add with PT manual assistance: 2x10   Supine hip abd with PT manual resistance: 2x10- not today  Standing lateral band walk with blue TB around thighs 6 laps in parallel bars  Hooklying LTR: x10 ea direction, 2 sets (pt is very hypomobile today, improved with repetition)- not today  Seated blue physioball roll out for lumbar hypomobility 2x10, PT stabilization of pelvis during, focused on diaphragmatic breathing during for physiological quieting/pain control  Seated lumbar flexion  with hands on thighs x 5  Front step ups: lead with R LE 2x6, pt b/l UE support on hand rails (Practiced 6 bc that's how many she has to get in her home)  Alternating toe taps on 6 inch step with PT supervision, occasional UE support 2x12  Pt amb at end of session with improved upright trunk posture  HEP instruction: Access Code: [email or web address] URL: [email or web address] Date: 01/28/2023 Prepared by: Max Fickle  Exercises - Supine Lower Trunk Rotation with PLB  - 1 x daily - 7 x weekly - 2 sets - 10 reps - Supine Hip Adduction Isometric with Ball  - 1 x daily - 7 x weekly - 2 sets - 10 reps - Sit to Stand  - 1 x daily - 7 x weekly - 2 sets - 5 reps   Not today: -seated marching, alternating pattern 1x20  -seated rainbow ball squeeze at the knees 10x5secH  -seated LAQ 1x15 bilat  -seated greenTB clams 1x15, rainbow ball betwixt feet for joint spacing -lateral side stepping in bars 3 laps -Standing hip abduction, focusing on upright trunk posture, verbal cues/mirror visual cues: 2x10 R and LE LE SAQ: 3# 2x10 L, R 0# 2x10 with PT tactile cues and assist for concentric phase, pt slowly lower on eccentric phase- not today   PATIENT EDUCATION:  Education details: HEP instruction, exercise technique/form  HOME EXERCISE PROGRAM: Access Code: [email or web address]  ASSESSMENT:  CLINICAL IMPRESSION: Pt overall seemed pretty motivated to participate in session today.  Tolerated LE strengthening well with PT supervision, and frequent tactile and verbal cues for optimal R quadriceps activation and L hip mm activation during therapeutic exercises.  Pt will benefit from skilled PT to maximize return to PLOF.   OBJECTIVE IMPAIRMENTS: Abnormal gait, cardiopulmonary status limiting activity, decreased activity tolerance, decreased balance, decreased endurance, decreased mobility, difficulty walking, decreased ROM, decreased strength, and pain.   ACTIVITY LIMITATIONS: carrying, lifting,  bending, squatting, sleeping, stairs, continence, and locomotion level  PARTICIPATION LIMITATIONS: meal prep, cleaning, laundry, and community activity  PERSONAL FACTORS: Age, Behavior pattern, Past/current experiences, Time since onset of injury/illness/exacerbation, and 3+ comorbidities: cardiac, autoimmune, DM, R THA, L hip AVN, R knee OA, lymphedema, see PMH above  are also affecting patient's functional outcome.   REHAB POTENTIAL: Good  CLINICAL DECISION MAKING: Evolving/moderate complexity  EVALUATION COMPLEXITY: Moderate   GOALS: Goals reviewed with  patient? Yes  SHORT TERM GOALS: Target date: 01/24/23 Pt will be able to perform HEP for LE strengthening 3x/week Baseline: to be initiated at future visit Goal status: INITIAL   LONG TERM GOALS: Target date: 02/22/23  Improve FOTO to predicted DC level indicating improved ability to perform her daily activities without being limited by chronic pain Baseline: to be administered at visit #2 Goal status: INITIAL  2.  Pt will be able to participate in 15 minutes therapeutic exercise for LE strength and balance at RPE level 4-6 without a seated rest break to improve her overall ability to perform her daily activities at home that require standing (kitchen/meal prep/chores)without being limited by chronic pain Baseline: 3-5 minutes Goal status: INITIAL  3.  Improve 5x STS to <20 seconds indicating improved LE strength and reduced fall risk Baseline: 31(with use of UE support) Goal status: INITIAL   PLAN:  PT FREQUENCY: 2x/week  PT DURATION: 6 weeks  PLANNED INTERVENTIONS: Therapeutic exercises, Therapeutic activity, Neuromuscular re-education, Balance training, Gait training, Patient/Family education, Self Care, and Joint mobilization  PLAN FOR NEXT SESSION: functional LE strength/balance/gait training.  Emphasize R quadriceps strengthening to stair navigation.  Max Fickle, PT, DPT, OCS Ardine Bjork, PT 02/18/2023,  5:36 PM   5:36 PM, 02/18/23

## 2023-02-20 ENCOUNTER — Ambulatory Visit: Payer: Medicare Other

## 2023-02-20 DIAGNOSIS — M6281 Muscle weakness (generalized): Secondary | ICD-10-CM | POA: Diagnosis not present

## 2023-02-20 DIAGNOSIS — G894 Chronic pain syndrome: Secondary | ICD-10-CM

## 2023-02-21 NOTE — Therapy (Signed)
OUTPATIENT PHYSICAL THERAPY LOWER TREATMENT   Patient Name: [name] MRN: [id number] DOB:[birth year], 77 y.o., female Today's Date: 02/21/2023  END OF SESSION:  PT End of Session - 02/20/23 1824     Visit Number 8    Number of Visits 10    Date for PT Re-Evaluation 02/22/23    Authorization Type Medicare; 2x/week x 4-6 weeks (02/22/23)    Progress Note Due on Visit 10    PT Start Time 1418    PT Stop Time 1500    PT Time Calculation (min) 42 min    Activity Tolerance Patient tolerated treatment well    Behavior During Therapy WFL for tasks assessed/performed              Past Medical History:  Diagnosis Date   Anterolisthesis    Cervical spine   Aortic arch aneurysm (HCC)    Asthma    Connective tissue disorder (HCC)    recurrent carotid arteritis, temporal arteritis, vasculitis mandible, general hepatitis, avascular necrosis bilat   DDD (degenerative disc disease), lumbar    Diabetes mellitus without complication (HCC)    Diverticulosis    Duodenitis    Ganglion cyst of right foot    Gouty arthritis    Hiatal hernia with GERD    History of cardiovascular stress test    a. 07/2018 [facility]): Fixed inferoapical defect w/ nl contraction-->attenuation artifact. No ischemia. EF 63%.   Hyperlipidemia    Hypertension    a. 02/2019 Renal artery duplex: no evidence of prox RAS.   Hyperuricemia    Keratitis sicca, bilateral (HCC)    Lymphedema of both lower extremities    Osteoarthritis    Pericarditis    Recurrent: Aug 97, July 05, Sept 08, July 16, July 18   PUD (peptic ulcer disease)    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HCC)    Sjogren's syndrome (HCC)    Syncope    Tendonitis, Achilles, right    Tortuous colon    Trigger finger    Past Surgical History:  Procedure Laterality Date   ABDOMINAL HYSTERECTOMY     BREAST BIOPSY     BREAST EXCISIONAL BIOPSY Left 1986   neg   CHOLECYSTECTOMY     COLONOSCOPY WITH PROPOFOL N/A 08/26/2018   Procedure: COLONOSCOPY  WITH PROPOFOL;  Surgeon: Midge Minium, MD;  Location: [facility] ENDOSCOPY;  Service: Endoscopy;  Laterality: N/A;   CYSTOSCOPY  02/26/2018   Anola Gurney, MD    distal arthrectomy     ESOPHAGOGASTRODUODENOSCOPY (EGD) WITH PROPOFOL N/A 08/26/2018   Procedure: ESOPHAGOGASTRODUODENOSCOPY (EGD) WITH PROPOFOL;  Surgeon: Midge Minium, MD;  Location: [facility] ENDOSCOPY;  Service: Endoscopy;  Laterality: N/A;   EXCISION NEUROMA     KNEE SURGERY Bilateral    1985, 2001, 11/2002, 12/2006   panhysterectomy  10/1983   SHOULDER SURGERY Right    reverse total arthroplasty w/ biceps tenodesis   TONSILLECTOMY AND ADENOIDECTOMY     TOTAL HIP ARTHROPLASTY Right 04/2007   WISDOM TOOTH EXTRACTION     Patient Active Problem List   Diagnosis Date Noted   Abdominal pain 07/05/2021   H/O total hip arthroplasty, right 07/12/2020   Status post left partial knee replacement 07/12/2020   Nausea 03/31/2020   Multiple thyroid nodules 12/28/2019   Thyroid nodule 09/29/2019   Lymphedema 11/10/2018   PAD (peripheral artery disease) (HCC) 10/12/2018   Aortic atherosclerosis (HCC) 10/12/2018   Carotid stenosis, asymptomatic, bilateral 10/12/2018   Positive colorectal cancer screening using Cologuard test 10/09/2018  Gastroesophageal reflux disease    Acute gastritis without hemorrhage    Osteoarthritis 04/29/2018   Hiatal hernia with GERD 04/29/2018   Hyperlipidemia 04/29/2018   Lymphedema of both lower extremities 04/29/2018   Left knee pain 10/23/2016   Cataracts, bilateral 07/31/2016   Glaucoma 07/31/2016   Lateral epicondylitis of left elbow 10/20/2015   Vitamin D deficiency 10/20/2015   Type 2 diabetes, controlled, with neuropathy (HCC) 10/16/2015   Type II diabetes mellitus with complication (HCC) 10/16/2015   Gouty arthritis 06/03/2015   H/O syncope 06/03/2015   Mild intermittent asthma 06/03/2015   Multiple gastric ulcers 06/03/2015   Schatzki's ring 06/03/2015   Undifferentiated connective tissue disease  (HCC) 06/03/2015   Bone disorder 04/05/2015   Dental injury 04/05/2015   Pericarditis 04/05/2015   Sjogren's syndrome (HCC) 09/07/2014   Elevated alkaline phosphatase level 08/24/2014   Lactose intolerance 08/24/2014   Obesity 08/24/2014   Peripheral edema 08/24/2014   Benign 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 Judithann Sheen  REFERRING PROVIDER: Dr. Judithann Sheen  REFERRING DIAG: Ataxia  THERAPY DIAG:  Muscle weakness (generalized)  Chronic pain syndrome  Rationale for Evaluation and Treatment: Rehabilitation  ONSET DATE: June 2024  SUBJECTIVE: From initial evaluation  SUBJECTIVE STATEMENT: See initial evaluation  PERTINENT HISTORY: She reports she went on a boat at [facility] in June, 2024; a few days later she had an "episode" where "everything hit her"; she describes it as being painful in her back, hips, and legs.  She reports there was no falls or any specific injury or incident that happened while on the boat that day. She had a f/u with her PCP after this "episode" for her annual wellness visit and mentioned it; she states she was referred to PT.  She has also been to chiropractor a few visits (not sure of tx) and it was "helpful" (not clear how or what was actually helpful). She feels like she is having trouble walking sometimes since this episode in June.  Overall, ISQ- not worsening or improving. She feels like she has difficulty walking up/down steps and walking around her house.   Aggravating factors: lying  supine, prolonged standing and walking- bother her back and hip pain.   Alleviating factors: "resting"   No specific localized area of pain; describes generalized low back, L hip (has h/o AVN and is waiting for hip replacement), and leg pain.  She does not use any assistive device for ambulation.    PMH: Avn L hip, medial L knee replacement, 2 arthroscopic surgeries on R knee, R THA; no lumbar spine surgeries; no major ankle surgeries  PAIN:  Are you having pain? No   PRECAUTIONS: Fall   WEIGHT BEARING RESTRICTIONS: No  FALLS:  Has patient fallen in last 6 months? No  PATIENT GOALS: "to get her mobility back"; to move around better without being limited by her back, hip pain  NEXT MD VISIT: unknown  TODAY'S TREATMENT 02/21/23 Subjective: Pt states she is having difficulty going up the stairs at her home because of her L  hip and R knee.  Her hand is sore from pulling on railing to do stairs.      Objective:  Therapeutic Exercises: (to promote lumbopelvic/hip ROM and strength) Nustep: seat #12, level 3 x 10 min- for hip/knee AROM and strength (hx taking during this), 3x 30 second intervals at level 4   STS from chair + airex, hands free 2x6  Seated LAQ: 3# R LE 2x12  Seated hip add with PT manual assistance: 2x10   Supine hip abd with PT manual resistance: 2x10- not today  Standing lateral band walk with blue TB around thighs 6 laps in parallel bars  Hooklying LTR: x10 ea direction, 2 sets (pt is very hypomobile today, improved with repetition)- not today  Seated blue physioball roll out for lumbar hypomobility 2x10, PT stabilization of pelvis during, focused on diaphragmatic breathing during for physiological quieting/pain control  Seated lumbar flexion with hands on thighs x 5  Front step ups: lead with R LE 2x6, pt b/l UE support on hand rails (Practiced 6 bc that's how many she has to get in her home)  Alternating toe taps on 6 inch step with PT supervision,  occasional UE support 2x12  Pt amb at end of session with improved upright trunk posture  Manual Therapy: (for L hip/low back)- not today Manual gentle long axis traction L LE for pain control as pt notes this is helpful: 10 second bouts, repeated x 5 min in various positions Gentle PROM hip flexion, abd, add within available ROM; long axis IR/ER x10 ea, 2 rounds  HEP instruction: Access Code: [email or web address] URL: [email or web address] Date: 01/28/2023 Prepared by: Max Fickle  Exercises - Supine Lower Trunk Rotation with PLB  - 1 x daily - 7 x weekly - 2 sets - 10 reps - Supine Hip Adduction Isometric with Ball  - 1 x daily - 7 x weekly - 2 sets - 10 reps - Sit to Stand  - 1 x daily - 7 x weekly - 2 sets - 5 reps   Not today: -seated marching, alternating pattern 1x20  -seated rainbow ball squeeze at the knees 10x5secH  -seated LAQ 1x15 bilat  -seated greenTB clams 1x15, rainbow ball betwixt feet for joint spacing -lateral side stepping in bars 3 laps -Standing hip abduction, focusing on upright trunk posture, verbal cues/mirror visual cues: 2x10 R and LE LE SAQ: 3# 2x10 L, R 0# 2x10 with PT tactile cues and assist for concentric phase, pt slowly lower on eccentric phase- not today   PATIENT EDUCATION:  Education details: HEP instruction, exercise technique/form  HOME EXERCISE PROGRAM: Access Code: [email or web address]  ASSESSMENT:  CLINICAL IMPRESSION: Pt overall seemed motivated to participate in today's session.  She tolerated LE strengthening well with PT supervision, and frequent tactile and verbal cues for optimal R quadriceps activation and L hip mm activation during therapeutic exercises.  She wore a thumb immobilizer during session due to report of flare up of tendonitis after practicing the stairs and holding the railing really hard at last session; discussed focusing on light hold on rail during step up to not aggravate her thumb tendonitis.  Pt will benefit from  skilled PT to maximize return to PLOF.   OBJECTIVE IMPAIRMENTS: Abnormal gait, cardiopulmonary status limiting activity, decreased activity tolerance, decreased balance, decreased endurance, decreased mobility, difficulty walking, decreased ROM, decreased strength, and pain.   ACTIVITY LIMITATIONS: carrying, lifting, bending, squatting, sleeping, stairs, continence, and locomotion level  PARTICIPATION LIMITATIONS: meal prep, cleaning, laundry, and community activity  PERSONAL FACTORS: Age, Behavior pattern, Past/current experiences, Time since onset of injury/illness/exacerbation, and 3+ comorbidities: cardiac, autoimmune, DM, R THA, L hip AVN, R knee OA, lymphedema, see PMH above  are also affecting patient's functional outcome.   REHAB POTENTIAL: Good  CLINICAL DECISION MAKING: Evolving/moderate complexity  EVALUATION COMPLEXITY: Moderate   GOALS: Goals reviewed with patient? Yes  SHORT TERM GOALS: Target date: 01/24/23 Pt will be able to perform HEP for LE strengthening 3x/week Baseline: to be initiated at future visit Goal status: INITIAL   LONG TERM GOALS: Target date: 02/22/23  Improve FOTO to predicted DC level indicating improved ability to perform her daily activities without being limited by chronic pain Baseline: to be administered at visit #2 Goal status: INITIAL  2.  Pt will be able to participate in 15 minutes therapeutic exercise for LE strength and balance at RPE level 4-6 without a seated rest break to improve her overall ability to perform her daily activities at home that require standing (kitchen/meal prep/chores)without being limited by chronic pain Baseline: 3-5 minutes Goal status: INITIAL  3.  Improve 5x STS to <20 seconds indicating improved LE strength and reduced fall risk Baseline: 31(with use of UE support) Goal status: INITIAL   PLAN:  PT FREQUENCY: 2x/week  PT DURATION: 6 weeks  PLANNED INTERVENTIONS: Therapeutic exercises, Therapeutic  activity, Neuromuscular re-education, Balance training, Gait training, Patient/Family education, Self Care, and Joint mobilization  PLAN FOR NEXT SESSION: functional LE strength/balance/gait training.  Emphasize R quadriceps strengthening to stair navigation.  Max Fickle, PT, DPT, OCS Ardine Bjork, PT 02/21/2023, 11:25 AM   11:25 AM, 02/21/23

## 2023-02-25 ENCOUNTER — Ambulatory Visit: Payer: Medicare Other

## 2023-02-25 DIAGNOSIS — M6281 Muscle weakness (generalized): Secondary | ICD-10-CM

## 2023-02-25 DIAGNOSIS — G894 Chronic pain syndrome: Secondary | ICD-10-CM

## 2023-02-25 NOTE — Therapy (Signed)
OUTPATIENT PHYSICAL THERAPY LOWER TREATMENT/RE-CERTIFICATION   Patient Name: [name] MRN: [id number] DOB:13-Mar-1946, 77 y.o., female Today's Date: 02/25/2023  END OF SESSION:  PT End of Session - 02/25/23 1249     Visit Number 9    Number of Visits 10    Date for PT Re-Evaluation 04/08/23    Authorization Type Medicare; 2x/week x 4-6 weeks (04/08/23)    Progress Note Due on Visit 10    PT Start Time 1248    PT Stop Time 1333    PT Time Calculation (min) 45 min    Activity Tolerance Patient tolerated treatment well    Behavior During Therapy WFL for tasks assessed/performed               Past Medical History:  Diagnosis Date   Anterolisthesis    Cervical spine   Aortic arch aneurysm (HCC)    Asthma    Connective tissue disorder (HCC)    recurrent carotid arteritis, temporal arteritis, vasculitis mandible, general hepatitis, avascular necrosis bilat   DDD (degenerative disc disease), lumbar    Diabetes mellitus without complication (HCC)    Diverticulosis    Duodenitis    Ganglion cyst of right foot    Gouty arthritis    Hiatal hernia with GERD    History of cardiovascular stress test    a. 07/2018 [facility]): Fixed inferoapical defect w/ nl contraction-->attenuation artifact. No ischemia. EF 63%.   Hyperlipidemia    Hypertension    a. 02/2019 Renal artery duplex: no evidence of prox RAS.   Hyperuricemia    Keratitis sicca, bilateral (HCC)    Lymphedema of both lower extremities    Osteoarthritis    Pericarditis    Recurrent: Aug 97, July 05, Sept 08, July 16, July 18   PUD (peptic ulcer disease)    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HCC)    Sjogren's syndrome (HCC)    Syncope    Tendonitis, Achilles, right    Tortuous colon    Trigger finger    Past Surgical History:  Procedure Laterality Date   ABDOMINAL HYSTERECTOMY     BREAST BIOPSY     BREAST EXCISIONAL BIOPSY Left 1986   neg   CHOLECYSTECTOMY     COLONOSCOPY WITH PROPOFOL N/A 08/26/2018    Procedure: COLONOSCOPY WITH PROPOFOL;  Surgeon: Midge Minium, MD;  Location: [facility] ENDOSCOPY;  Service: Endoscopy;  Laterality: N/A;   CYSTOSCOPY  02/26/2018   Anola Gurney, MD    distal arthrectomy     ESOPHAGOGASTRODUODENOSCOPY (EGD) WITH PROPOFOL N/A 08/26/2018   Procedure: ESOPHAGOGASTRODUODENOSCOPY (EGD) WITH PROPOFOL;  Surgeon: Midge Minium, MD;  Location: [facility] ENDOSCOPY;  Service: Endoscopy;  Laterality: N/A;   EXCISION NEUROMA     KNEE SURGERY Bilateral    1985, 2001, 11/2002, 12/2006   panhysterectomy  10/1983   SHOULDER SURGERY Right    reverse total arthroplasty w/ biceps tenodesis   TONSILLECTOMY AND ADENOIDECTOMY     TOTAL HIP ARTHROPLASTY Right 04/2007   WISDOM TOOTH EXTRACTION     Patient Active Problem List   Diagnosis Date Noted   Abdominal pain 07/05/2021   H/O total hip arthroplasty, right 07/12/2020   Status post left partial knee replacement 07/12/2020   Nausea 03/31/2020   Multiple thyroid nodules 12/28/2019   Thyroid nodule 09/29/2019   Lymphedema 11/10/2018   PAD (peripheral artery disease) (HCC) 10/12/2018   Aortic atherosclerosis (HCC) 10/12/2018   Carotid stenosis, asymptomatic, bilateral 10/12/2018   Positive colorectal cancer screening using Cologuard test 10/09/2018  Gastroesophageal reflux disease    Acute gastritis without hemorrhage    Osteoarthritis 04/29/2018   Hiatal hernia with GERD 04/29/2018   Hyperlipidemia 04/29/2018   Lymphedema of both lower extremities 04/29/2018   Left knee pain 10/23/2016   Cataracts, bilateral 07/31/2016   Glaucoma 07/31/2016   Lateral epicondylitis of left elbow 10/20/2015   Vitamin D deficiency 10/20/2015   Type 2 diabetes, controlled, with neuropathy (HCC) 10/16/2015   Type II diabetes mellitus with complication (HCC) 10/16/2015   Gouty arthritis 06/03/2015   H/O syncope 06/03/2015   Mild intermittent asthma 06/03/2015   Multiple gastric ulcers 06/03/2015   Schatzki's ring 06/03/2015   Undifferentiated  connective tissue disease (HCC) 06/03/2015   Bone disorder 04/05/2015   Dental injury 04/05/2015   Pericarditis 04/05/2015   Sjogren's syndrome (HCC) 09/07/2014   Elevated alkaline phosphatase level 08/24/2014   Lactose intolerance 08/24/2014   Obesity 08/24/2014   Peripheral edema 08/24/2014   Benign 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 Judithann Sheen  REFERRING PROVIDER: Dr. Judithann Sheen  REFERRING DIAG: Ataxia  THERAPY DIAG:  Muscle weakness (generalized) - Plan: PT plan of care cert/re-cert  Chronic pain syndrome - Plan: PT plan of care cert/re-cert  Rationale for Evaluation and Treatment: Rehabilitation  ONSET DATE: June 2024  SUBJECTIVE: From initial evaluation  SUBJECTIVE STATEMENT: See initial evaluation  PERTINENT HISTORY: She reports she went on a boat at [facility] in June, 2024; a few days later she had an "episode" where "everything hit her"; she describes it as being painful in her back, hips, and legs.  She reports there was no falls or any specific injury or incident that happened while on the boat that day. She had a f/u with her PCP after this "episode" for her annual wellness visit and mentioned it; she states she was referred to PT.  She has also been to chiropractor a few visits (not sure of tx) and it was "helpful" (not clear how or what was actually helpful). She feels like she is having trouble walking sometimes since this episode in June.  Overall, ISQ- not worsening or improving. She feels like she  has difficulty walking up/down steps and walking around her house.   Aggravating factors: lying supine, prolonged standing and walking- bother her back and hip pain.   Alleviating factors: "resting"   No specific localized area of pain; describes generalized low back, L hip (has h/o AVN and is waiting for hip replacement), and leg pain.  She does not use any assistive device for ambulation.    PMH: Avn L hip, medial L knee replacement, 2 arthroscopic surgeries on R knee, R THA; no lumbar spine surgeries; no major ankle surgeries  PAIN:  Are you having pain? No   PRECAUTIONS: Fall   WEIGHT BEARING RESTRICTIONS: No  FALLS:  Has patient fallen in last 6 months? No  PATIENT GOALS: "to get her mobility back"; to move around better without being limited by her back, hip pain  NEXT MD VISIT: unknown  TODAY'S TREATMENT 02/25/23 Subjective: Patient with no  new complaints on arrival. On Saturday, she did a little gardening pulling weeds and feels like she is getting better because she wasn't hurting as much but was sore the next day.      Objective:  Physical therapy treatment session today consisted of completing assessment of goals and administration of testing as demonstrated and documented in flow sheet, treatment, and goals section of this note. Addition treatments may be found below.   Therapeutic Exercises: (to promote lumbopelvic/hip ROM and strength)  Nustep: seat #12, level 3 x 10 min- for hip/knee AROM and strength (hx taking during this), 3x 30 second intervals at level 4   4 Stair negotiation with B handrails with step to pattern leading with R LE  STS from chair + airex, hands free x 10  Alternating toe taps on 6 inch step with PT supervision, occasional UE support 2x12    Not today: -Seated LAQ: 3# R LE 2x12 -Seated hip add with PT manual assistance: 2x10  -Supine hip abd with PT manual resistance: 2x10- not today -Standing lateral band walk with blue TB around  thighs 6 laps in parallel bars -Hooklying LTR: x10 ea direction, 2 sets (pt is very hypomobile today, improved with repetition)- not today -Seated blue physioball roll out for lumbar hypomobility 2x10, PT stabilization of pelvis during, focused on diaphragmatic breathing during for physiological quieting/pain control -Seated lumbar flexion with hands on thighs x 5 -Front step ups: lead with R LE 2x6, pt b/l UE support on hand rails (Practiced 6 bc that's how many she has to get in her home) -seated marching, alternating pattern 1x20  -seated rainbow ball squeeze at the knees 10x5secH  -seated LAQ 1x15 bilat  -seated greenTB clams 1x15, rainbow ball betwixt feet for joint spacing -lateral side stepping in bars 3 laps -Standing hip abduction, focusing on upright trunk posture, verbal cues/mirror visual cues: 2x10 R and LE LE SAQ: 3# 2x10 L, R 0# 2x10 with PT tactile cues and assist for concentric phase, pt slowly lower on eccentric phase- not today   PATIENT EDUCATION:  Education details: HEP instruction, exercise technique/form  HOME EXERCISE PROGRAM: Access Code: [email or web address] URL: [email or web address] Date: 01/28/2023 Prepared by: Max Fickle  Exercises - Supine Lower Trunk Rotation with PLB  - 1 x daily - 7 x weekly - 2 sets - 10 reps - Supine Hip Adduction Isometric with Ball  - 1 x daily - 7 x weekly - 2 sets - 10 reps - Sit to Stand  - 1 x daily - 7 x weekly - 2 sets - 5 reps  ASSESSMENT:  CLINICAL IMPRESSION:   Patient arrives to treatment session eager to participate. Patient demonstrating progress towards physical therapy goals. Patient's condition has the potential to improve in response to therapy. Maximum improvement is yet to be obtained. The anticipated improvement is attainable and reasonable in a generally predictable time. Patient will benefit from skilled PT to maximize return to PLOF.   OBJECTIVE IMPAIRMENTS: Abnormal gait, cardiopulmonary status  limiting activity, decreased activity tolerance, decreased balance, decreased endurance, decreased mobility, difficulty walking, decreased ROM, decreased strength, and pain.   ACTIVITY LIMITATIONS: carrying, lifting, bending, squatting, sleeping, stairs, continence, and locomotion level  PARTICIPATION LIMITATIONS: meal prep, cleaning, laundry, and community activity  PERSONAL FACTORS: Age, Behavior pattern, Past/current experiences, Time since onset of injury/illness/exacerbation, and 3+ comorbidities: cardiac, autoimmune, DM, R THA, L hip AVN, R knee OA, lymphedema, see PMH above  are also affecting patient's functional outcome.   REHAB POTENTIAL: Good  CLINICAL DECISION MAKING: Evolving/moderate complexity  EVALUATION COMPLEXITY: Moderate   GOALS: Goals reviewed with patient? Yes  SHORT TERM GOALS: Target date: 01/24/23 Pt will be able to perform HEP for LE strengthening 3x/week Baseline: to be initiated at future visit; 9/16: patient reports doing a little of her HEP each day "depending on what hurts"  Goal status: MET    LONG TERM GOALS: Target date: 02/22/23  Improve FOTO to predicted DC level indicating improved ability to perform her daily activities without being limited by chronic pain Baseline: to be administered at visit #2; 9/16: 43 Goal status: ONGOING   2.  Pt will be able to participate in 15 minutes therapeutic exercise for LE strength and balance at RPE level 4-6 without a seated rest break to improve her overall ability to perform her daily activities at home that require standing (kitchen/meal prep/chores)without being limited by chronic pain Baseline: 3-5 minutes; 9/16: ~10 minutes Goal status: ONGOING  3.  Improve 5x STS to <20 seconds indicating improved LE strength and reduced fall risk Baseline: 31(with use of UE support); 9/16; 35.3 seconds (with hands on knees) Goal status: ONGOING  4. Patient will increase distance by 200' for progression to  community ambulator and improve gait ability  Baseline: 9/16: 735'   Goal status: INITIAL   PLAN:  PT FREQUENCY: 2x/week  PT DURATION: 6 weeks  PLANNED INTERVENTIONS: Therapeutic exercises, Therapeutic activity, Neuromuscular re-education, Balance training, Gait training, Patient/Family education, Self Care, and Joint mobilization  PLAN FOR NEXT SESSION: functional LE strength/balance/gait training.  Emphasize R quadriceps strengthening to stair navigation.  Viviann Spare, PT, DPT 02/25/2023, 1:33 PM   1:33 PM, 02/25/23

## 2023-02-27 ENCOUNTER — Ambulatory Visit: Payer: Medicare Other

## 2023-02-27 DIAGNOSIS — G894 Chronic pain syndrome: Secondary | ICD-10-CM

## 2023-02-27 DIAGNOSIS — M6281 Muscle weakness (generalized): Secondary | ICD-10-CM

## 2023-02-27 NOTE — Therapy (Signed)
OUTPATIENT PHYSICAL THERAPY LOWER TREATMENT   Patient Name: [name] MRN: [id number] DOB:12/21/1945, 77 y.o., female Today's Date: 02/27/2023  END OF SESSION:  PT End of Session - 02/27/23 1246     Visit Number 10    Number of Visits 20    Date for PT Re-Evaluation 04/08/23    Authorization Type Medicare; 2x/week x 4-6 weeks (04/08/23)    Progress Note Due on Visit 10    PT Start Time 1246    PT Stop Time 1330    PT Time Calculation (min) 44 min    Activity Tolerance Patient tolerated treatment well    Behavior During Therapy WFL for tasks assessed/performed               Past Medical History:  Diagnosis Date   Anterolisthesis    Cervical spine   Aortic arch aneurysm (HCC)    Asthma    Connective tissue disorder (HCC)    recurrent carotid arteritis, temporal arteritis, vasculitis mandible, general hepatitis, avascular necrosis bilat   DDD (degenerative disc disease), lumbar    Diabetes mellitus without complication (HCC)    Diverticulosis    Duodenitis    Ganglion cyst of right foot    Gouty arthritis    Hiatal hernia with GERD    History of cardiovascular stress test    a. 07/2018 [facility] [place]): Fixed inferoapical defect w/ nl contraction-->attenuation artifact. No ischemia. EF 63%.   Hyperlipidemia    Hypertension    a. 02/2019 Renal artery duplex: no evidence of prox RAS.   Hyperuricemia    Keratitis sicca, bilateral (HCC)    Lymphedema of both lower extremities    Osteoarthritis    Pericarditis    Recurrent: Aug 97, July 05, Sept 08, July 16, July 18   PUD (peptic ulcer disease)    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HCC)    Sjogren's syndrome (HCC)    Syncope    Tendonitis, Achilles, right    Tortuous colon    Trigger finger    Past Surgical History:  Procedure Laterality Date   ABDOMINAL HYSTERECTOMY     BREAST BIOPSY     BREAST EXCISIONAL BIOPSY Left 1986   neg   CHOLECYSTECTOMY     COLONOSCOPY WITH PROPOFOL N/A 08/26/2018   Procedure:  COLONOSCOPY WITH PROPOFOL;  Surgeon: Midge Minium, MD;  Location: [facility] ENDOSCOPY;  Service: Endoscopy;  Laterality: N/A;   CYSTOSCOPY  02/26/2018   Anola Gurney, MD    distal arthrectomy     ESOPHAGOGASTRODUODENOSCOPY (EGD) WITH PROPOFOL N/A 08/26/2018   Procedure: ESOPHAGOGASTRODUODENOSCOPY (EGD) WITH PROPOFOL;  Surgeon: Midge Minium, MD;  Location: [facility] ENDOSCOPY;  Service: Endoscopy;  Laterality: N/A;   EXCISION NEUROMA     KNEE SURGERY Bilateral    1985, 2001, 11/2002, 12/2006   panhysterectomy  10/1983   SHOULDER SURGERY Right    reverse total arthroplasty w/ biceps tenodesis   TONSILLECTOMY AND ADENOIDECTOMY     TOTAL HIP ARTHROPLASTY Right 04/2007   WISDOM TOOTH EXTRACTION     Patient Active Problem List   Diagnosis Date Noted   Abdominal pain 07/05/2021   H/O total hip arthroplasty, right 07/12/2020   Status post left partial knee replacement 07/12/2020   Nausea 03/31/2020   Multiple thyroid nodules 12/28/2019   Thyroid nodule 09/29/2019   Lymphedema 11/10/2018   PAD (peripheral artery disease) (HCC) 10/12/2018   Aortic atherosclerosis (HCC) 10/12/2018   Carotid stenosis, asymptomatic, bilateral 10/12/2018   Positive colorectal cancer screening using Cologuard test 10/09/2018  Gastroesophageal reflux disease    Acute gastritis without hemorrhage    Osteoarthritis 04/29/2018   Hiatal hernia with GERD 04/29/2018   Hyperlipidemia 04/29/2018   Lymphedema of both lower extremities 04/29/2018   Left knee pain 10/23/2016   Cataracts, bilateral 07/31/2016   Glaucoma 07/31/2016   Lateral epicondylitis of left elbow 10/20/2015   Vitamin D deficiency 10/20/2015   Type 2 diabetes, controlled, with neuropathy (HCC) 10/16/2015   Type II diabetes mellitus with complication (HCC) 10/16/2015   Gouty arthritis 06/03/2015   H/O syncope 06/03/2015   Mild intermittent asthma 06/03/2015   Multiple gastric ulcers 06/03/2015   Schatzki's ring 06/03/2015   Undifferentiated connective  tissue disease (HCC) 06/03/2015   Bone disorder 04/05/2015   Dental injury 04/05/2015   Pericarditis 04/05/2015   Sjogren's syndrome (HCC) 09/07/2014   Elevated alkaline phosphatase level 08/24/2014   Lactose intolerance 08/24/2014   Obesity 08/24/2014   Peripheral edema 08/24/2014   Benign 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 Judithann Sheen  REFERRING PROVIDER: Dr. Judithann Sheen  REFERRING DIAG: Ataxia  THERAPY DIAG:  Muscle weakness (generalized)  Chronic pain syndrome  Rationale for Evaluation and Treatment: Rehabilitation  ONSET DATE: June 2024  SUBJECTIVE: From initial evaluation  SUBJECTIVE STATEMENT: See initial evaluation  PERTINENT HISTORY: She reports she went on a boat at [facility] in June, 2024; a few days later she had an "episode" where "everything hit her"; she describes it as being painful in her back, hips, and legs.  She reports there was no falls or any specific injury or incident that happened while on the boat that day. She had a f/u with her PCP after this "episode" for her annual wellness visit and mentioned it; she states she was referred to PT.  She has also been to chiropractor a few visits (not sure of tx) and it was "helpful" (not clear how or what was actually helpful). She feels like she is having trouble walking sometimes since this episode in June.  Overall, ISQ- not worsening or improving. She feels like she has difficulty walking up/down steps and walking around her house.   Aggravating  factors: lying supine, prolonged standing and walking- bother her back and hip pain.   Alleviating factors: "resting"   No specific localized area of pain; describes generalized low back, L hip (has h/o AVN and is waiting for hip replacement), and leg pain.  She does not use any assistive device for ambulation.    PMH: Avn L hip, medial L knee replacement, 2 arthroscopic surgeries on R knee, R THA; no lumbar spine surgeries; no major ankle surgeries  PAIN:  Are you having pain? No   PRECAUTIONS: Fall   WEIGHT BEARING RESTRICTIONS: No  FALLS:  Has patient fallen in last 6 months? No  PATIENT GOALS: "to get her mobility back"; to move around better without being limited by her back, hip pain  NEXT MD VISIT: unknown  TODAY'S TREATMENT 02/27/23 Subjective: Patient has no new complaints upon arrival.  Her hip/back feel stiff upon arrival.  Pain:   Objective:   Therapeutic Exercises: (to promote lumbopelvic/hip ROM and strength) Nustep: seat #12, level 3 x 10 min- for hip/knee AROM and strength (hx taking during this), 4x 30 second intervals at level 4, for LE strengthening -front step ups with min UE support- lead with R LE 2x6 -Seated LAQ: 3# R LE 2x12 -Seated hip add with PT manual assistance: 2x10  -Supine hip abd with PT manual resistance: 2x10 -STS x10 (airex under hips) -seated lumbar flexion with blue physioball: x10, focus on x2 diaphragmatic breathing rounds in each stretch -Hooklying LTR: x10 ea direction, 2 sets (pt is very hypomobile today, improved with repetition)  Manual Therapy: (for L hip/low back) Manual gentle long axis traction L LE for pain control as pt notes this is helpful: 10 second bouts, repeated x 5 min in various positions Gentle PROM hip flexion, abd, add within available ROM; long axis IR/ER x10 ea, 2 rounds (+) painful MTP's R lumbar paraspinals- performed STM R>L lumbar spine paraspinal mm x 5 min at end of session   PATIENT EDUCATION:   Education details: HEP instruction, exercise technique/form  HOME EXERCISE PROGRAM: Access Code: [email or web address] URL: [email or web address] Date: 01/28/2023 Prepared by: Max Fickle  Exercises - Supine Lower Trunk Rotation with PLB  - 1 x daily - 7 x weekly - 2 sets - 10 reps - Supine Hip Adduction Isometric with Ball  - 1 x daily - 7 x weekly - 2 sets - 10 reps - Sit to Stand  - 1 x daily - 7 x weekly - 2 sets - 5 reps  ASSESSMENT:  CLINICAL IMPRESSION:  Patient was able to perform front step ups today without leaning as heavily on railing for support as last week.  (+) MTPs in lumbar spine contributing to hypomobility/pain; addressed with STM today.  PT able to amb with improved upright trunk posture after this.  Patient demonstrating progress towards physical therapy goals. Patient's condition has the potential to improve in response to therapy. Maximum improvement is yet to be obtained. The anticipated improvement is attainable and reasonable in a generally predictable time. Patient will benefit from skilled PT to maximize return to PLOF.   OBJECTIVE IMPAIRMENTS: Abnormal gait, cardiopulmonary status limiting activity, decreased activity tolerance, decreased balance, decreased endurance, decreased mobility, difficulty walking, decreased ROM, decreased strength, and pain.   ACTIVITY LIMITATIONS: carrying, lifting, bending, squatting, sleeping, stairs, continence, and locomotion level  PARTICIPATION LIMITATIONS: meal prep, cleaning, laundry, and community activity  PERSONAL FACTORS: Age, Behavior pattern, Past/current experiences, Time since onset of injury/illness/exacerbation, and 3+ comorbidities: cardiac, autoimmune, DM, R THA, L hip AVN, R knee OA, lymphedema, see PMH above  are also affecting patient's functional outcome.   REHAB POTENTIAL: Good  CLINICAL DECISION MAKING: Evolving/moderate complexity  EVALUATION COMPLEXITY: Moderate   GOALS: Goals reviewed with  patient? Yes  SHORT TERM GOALS: Target date: 01/24/23 Pt will be able to perform HEP for LE strengthening 3x/week Baseline: to be initiated at future visit; 9/16: patient reports doing a little of her HEP each day "depending on what hurts"  Goal status: MET    LONG TERM GOALS: Target date: 04/08/23  Improve FOTO to predicted DC level indicating improved ability to perform her daily activities without being limited by chronic pain Baseline: to be administered at visit #2; 9/16: 43 Goal status: ONGOING   2.  Pt will be able to participate in 15 minutes therapeutic exercise for LE strength and balance at RPE level 4-6 without a seated rest  break to improve her overall ability to perform her daily activities at home that require standing (kitchen/meal prep/chores)without being limited by chronic pain Baseline: 3-5 minutes; 9/16: ~10 minutes Goal status: ONGOING  3.  Improve 5x STS to <20 seconds indicating improved LE strength and reduced fall risk Baseline: 31(with use of UE support); 9/16; 35.3 seconds (with hands on knees) Goal status: ONGOING  4. Patient will increase distance by 200' for progression to community ambulator and improve gait ability  Baseline: 9/16: 735'   Goal status: INITIAL   PLAN:  PT FREQUENCY: 2x/week  PT DURATION: 6 weeks  PLANNED INTERVENTIONS: Therapeutic exercises, Therapeutic activity, Neuromuscular re-education, Balance training, Gait training, Patient/Family education, Self Care, and Joint mobilization  PLAN FOR NEXT SESSION: functional LE strength/balance/gait training.  Emphasize R quadriceps strengthening to stair navigation.  STM R lumbar paraspinals.  Ardine Bjork, PT, DPT 02/27/2023, 3:30 PM   3:30 PM, 02/27/23

## 2023-03-07 ENCOUNTER — Ambulatory Visit: Payer: Medicare Other

## 2023-03-07 DIAGNOSIS — G894 Chronic pain syndrome: Secondary | ICD-10-CM

## 2023-03-07 DIAGNOSIS — M6281 Muscle weakness (generalized): Secondary | ICD-10-CM

## 2023-03-07 NOTE — Therapy (Signed)
OUTPATIENT PHYSICAL THERAPY LOWER TREATMENT   Patient Name: [name] MRN: [id number] DOB:December 24, 1945, 77 y.o., female Today's Date: 03/07/2023  END OF SESSION:  PT End of Session - 03/07/23 1415     Visit Number 11    Number of Visits 20    Date for PT Re-Evaluation 04/08/23    Authorization Type Medicare; 2x/week x 4-6 weeks (04/08/23)    Progress Note Due on Visit 10    PT Start Time 1250    PT Stop Time 1335    PT Time Calculation (min) 45 min    Activity Tolerance Patient tolerated treatment well    Behavior During Therapy WFL for tasks assessed/performed               Past Medical History:  Diagnosis Date   Anterolisthesis    Cervical spine   Aortic arch aneurysm (HCC)    Asthma    Connective tissue disorder (HCC)    recurrent carotid arteritis, temporal arteritis, vasculitis mandible, general hepatitis, avascular necrosis bilat   DDD (degenerative disc disease), lumbar    Diabetes mellitus without complication (HCC)    Diverticulosis    Duodenitis    Ganglion cyst of right foot    Gouty arthritis    Hiatal hernia with GERD    History of cardiovascular stress test    a. 07/2018 [facility]): Fixed inferoapical defect w/ nl contraction-->attenuation artifact. No ischemia. EF 63%.   Hyperlipidemia    Hypertension    a. 02/2019 Renal artery duplex: no evidence of prox RAS.   Hyperuricemia    Keratitis sicca, bilateral (HCC)    Lymphedema of both lower extremities    Osteoarthritis    Pericarditis    Recurrent: Aug 97, July 05, Sept 08, July 16, July 18   PUD (peptic ulcer disease)    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HCC)    Sjogren's syndrome (HCC)    Syncope    Tendonitis, Achilles, right    Tortuous colon    Trigger finger    Past Surgical History:  Procedure Laterality Date   ABDOMINAL HYSTERECTOMY     BREAST BIOPSY     BREAST EXCISIONAL BIOPSY Left 1986   neg   CHOLECYSTECTOMY     COLONOSCOPY WITH PROPOFOL N/A 08/26/2018   Procedure:  COLONOSCOPY WITH PROPOFOL;  Surgeon: Midge Minium, MD;  Location: [facility] ENDOSCOPY;  Service: Endoscopy;  Laterality: N/A;   CYSTOSCOPY  02/26/2018   Anola Gurney, MD    distal arthrectomy     ESOPHAGOGASTRODUODENOSCOPY (EGD) WITH PROPOFOL N/A 08/26/2018   Procedure: ESOPHAGOGASTRODUODENOSCOPY (EGD) WITH PROPOFOL;  Surgeon: Midge Minium, MD;  Location: [facility] ENDOSCOPY;  Service: Endoscopy;  Laterality: N/A;   EXCISION NEUROMA     KNEE SURGERY Bilateral    1985, 2001, 11/2002, 12/2006   panhysterectomy  10/1983   SHOULDER SURGERY Right    reverse total arthroplasty w/ biceps tenodesis   TONSILLECTOMY AND ADENOIDECTOMY     TOTAL HIP ARTHROPLASTY Right 04/2007   WISDOM TOOTH EXTRACTION     Patient Active Problem List   Diagnosis Date Noted   Abdominal pain 07/05/2021   H/O total hip arthroplasty, right 07/12/2020   Status post left partial knee replacement 07/12/2020   Nausea 03/31/2020   Multiple thyroid nodules 12/28/2019   Thyroid nodule 09/29/2019   Lymphedema 11/10/2018   PAD (peripheral artery disease) (HCC) 10/12/2018   Aortic atherosclerosis (HCC) 10/12/2018   Carotid stenosis, asymptomatic, bilateral 10/12/2018   Positive colorectal cancer screening using Cologuard test 10/09/2018  Gastroesophageal reflux disease    Acute gastritis without hemorrhage    Osteoarthritis 04/29/2018   Hiatal hernia with GERD 04/29/2018   Hyperlipidemia 04/29/2018   Lymphedema of both lower extremities 04/29/2018   Left knee pain 10/23/2016   Cataracts, bilateral 07/31/2016   Glaucoma 07/31/2016   Lateral epicondylitis of left elbow 10/20/2015   Vitamin D deficiency 10/20/2015   Type 2 diabetes, controlled, with neuropathy (HCC) 10/16/2015   Type II diabetes mellitus with complication (HCC) 10/16/2015   Gouty arthritis 06/03/2015   H/O syncope 06/03/2015   Mild intermittent asthma 06/03/2015   Multiple gastric ulcers 06/03/2015   Schatzki's ring 06/03/2015   Undifferentiated connective  tissue disease (HCC) 06/03/2015   Bone disorder 04/05/2015   Dental injury 04/05/2015   Pericarditis 04/05/2015   Sjogren's syndrome (HCC) 09/07/2014   Elevated alkaline phosphatase level 08/24/2014   Lactose intolerance 08/24/2014   Obesity 08/24/2014   Peripheral edema 08/24/2014   Benign 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 Judithann Sheen  REFERRING PROVIDER: Dr. Judithann Sheen  REFERRING DIAG: Ataxia  THERAPY DIAG:  Muscle weakness (generalized)  Chronic pain syndrome  Rationale for Evaluation and Treatment: Rehabilitation  ONSET DATE: June 2024  SUBJECTIVE: From initial evaluation  SUBJECTIVE STATEMENT: See initial evaluation  PERTINENT HISTORY: She reports she went on a boat at [facility] in June, 2024; a few days later she had an "episode" where "everything hit her"; she describes it as being painful in her back, hips, and legs.  She reports there was no falls or any specific injury or incident that happened while on the boat that day. She had a f/u with her PCP after this "episode" for her annual wellness visit and mentioned it; she states she was referred to PT.  She has also been to chiropractor a few visits (not sure of tx) and it was "helpful" (not clear how or what was actually helpful). She feels like she is having trouble walking sometimes since this episode in June.  Overall, ISQ- not worsening or improving. She feels like she has difficulty walking up/down steps and walking around her house.   Aggravating  factors: lying supine, prolonged standing and walking- bother her back and hip pain.   Alleviating factors: "resting"   No specific localized area of pain; describes generalized low back, L hip (has h/o AVN and is waiting for hip replacement), and leg pain.  She does not use any assistive device for ambulation.    PMH: Avn L hip, medial L knee replacement, 2 arthroscopic surgeries on R knee, R THA; no lumbar spine surgeries; no major ankle surgeries  PAIN:  Are you having pain? No   PRECAUTIONS: Fall   WEIGHT BEARING RESTRICTIONS: No  FALLS:  Has patient fallen in last 6 months? No  PATIENT GOALS: "to get her mobility back"; to move around better without being limited by her back, hip pain  NEXT MD VISIT: unknown  TODAY'S TREATMENT 03/07/23 Subjective: Patient has been trying to focus on going up her stairs deliberately at home.  Pain: 4-5/10  Objective:   Therapeutic Exercises: (to promote lumbopelvic/hip ROM and strength) Nustep: seat #12, level 3 x 10 min- for hip/knee AROM and strength (hx taking during this), 4x 30 second intervals at level 4, for LE strengthening -front step ups with min UE support- lead with R LE 2x6 -Seated LAQ: 3# R LE 2x12 (not today) -Seated hip add with PT manual assistance: 2x10  -Supine hip abd with PT manual resistance: 2x10 -STS x10 (airex under hips) -seated lumbar flexion with blue physioball: x10, focus on x2 diaphragmatic breathing rounds in each stretch -Hooklying LTR: x10 ea direction, 2 sets (pt is very hypomobile today, improved with repetition) -SAQ 2# ankle weights bilaterally 2x15   Manual Therapy: (for L hip/low back) Manual gentle long axis traction L LE for pain control as pt notes this is helpful: 10 second bouts, repeated x 5 min in various positions Gentle PROM hip flexion, abd, add within available ROM; long axis IR/ER x10 ea, 2 rounds (+) painful MTP's R lumbar paraspinals- performed STM R>L lumbar spine paraspinal mm x  5 min at end of session (not today)   PATIENT EDUCATION:  Education details: HEP instruction, exercise technique/form  HOME EXERCISE PROGRAM: Access Code: [email or web address] URL: [email or web address] Date: 01/28/2023 Prepared by: Max Fickle  Exercises - Supine Lower Trunk Rotation with PLB  - 1 x daily - 7 x weekly - 2 sets - 10 reps - Supine Hip Adduction Isometric with Ball  - 1 x daily - 7 x weekly - 2 sets - 10 reps - Sit to Stand  - 1 x daily - 7 x weekly - 2 sets - 5 reps  ASSESSMENT:  CLINICAL IMPRESSION:  Able to tolerate increased resistance for R quadriceps strengthening today.  Pt overall is making slow steady progress towards PT goals.  Patient's condition has the potential to improve in response to therapy. Maximum improvement is yet to be obtained. The anticipated improvement is attainable and reasonable in a generally predictable time. Patient will benefit from skilled PT to maximize return to PLOF.   OBJECTIVE IMPAIRMENTS: Abnormal gait, cardiopulmonary status limiting activity, decreased activity tolerance, decreased balance, decreased endurance, decreased mobility, difficulty walking, decreased ROM, decreased strength, and pain.   ACTIVITY LIMITATIONS: carrying, lifting, bending, squatting, sleeping, stairs, continence, and locomotion level  PARTICIPATION LIMITATIONS: meal prep, cleaning, laundry, and community activity  PERSONAL FACTORS: Age, Behavior pattern, Past/current experiences, Time since onset of injury/illness/exacerbation, and 3+ comorbidities: cardiac, autoimmune, DM, R THA, L hip AVN, R knee OA, lymphedema, see PMH above  are also affecting patient's functional outcome.   REHAB POTENTIAL: Good  CLINICAL DECISION MAKING: Evolving/moderate complexity  EVALUATION COMPLEXITY: Moderate   GOALS: Goals reviewed with patient? Yes  SHORT TERM GOALS: Target date: 01/24/23 Pt will be able to perform HEP for LE strengthening 3x/week Baseline: to  be initiated at future visit; 9/16: patient reports doing a little of her HEP each day "depending on what hurts"  Goal status: MET    LONG TERM GOALS: Target date: 04/08/23  Improve FOTO to predicted DC level indicating improved ability to perform her daily activities without being limited by chronic pain Baseline: to be administered at visit #2; 9/16: 43 Goal status: ONGOING   2.  Pt will be able to participate in 15 minutes therapeutic exercise for LE strength and balance at RPE level 4-6 without a seated rest break to improve her overall ability to perform her daily activities at home that require standing (kitchen/meal prep/chores)without being limited by chronic pain  Baseline: 3-5 minutes; 9/16: ~10 minutes Goal status: ONGOING  3.  Improve 5x STS to <20 seconds indicating improved LE strength and reduced fall risk Baseline: 31(with use of UE support); 9/16; 35.3 seconds (with hands on knees) Goal status: ONGOING  4. Patient will increase distance by 200' for progression to community ambulator and improve gait ability  Baseline: 9/16: 735'   Goal status: INITIAL   PLAN:  PT FREQUENCY: 2x/week  PT DURATION: 6 weeks  PLANNED INTERVENTIONS: Therapeutic exercises, Therapeutic activity, Neuromuscular re-education, Balance training, Gait training, Patient/Family education, Self Care, and Joint mobilization  PLAN FOR NEXT SESSION: functional LE strength/balance/gait training.  Emphasize R quadriceps strengthening to stair navigation.  STM R lumbar paraspinals.  Ardine Bjork, PT, DPT 03/07/2023, 5:36 PM   5:36 PM, 03/07/23

## 2023-03-11 ENCOUNTER — Ambulatory Visit: Payer: Medicare Other

## 2023-03-11 DIAGNOSIS — M6281 Muscle weakness (generalized): Secondary | ICD-10-CM

## 2023-03-11 DIAGNOSIS — G894 Chronic pain syndrome: Secondary | ICD-10-CM

## 2023-03-11 NOTE — Therapy (Signed)
OUTPATIENT PHYSICAL THERAPY LOWER TREATMENT   Patient Name: [name] MRN: [id number] DOB:1945/12/12, 77 y.o., female Today's Date: 03/11/2023  END OF SESSION:  PT End of Session - 03/11/23 1258     Visit Number 12    Number of Visits 20    Date for PT Re-Evaluation 04/08/23    Authorization Type Medicare; 2x/week x 4-6 weeks (04/08/23)    Progress Note Due on Visit 10    PT Start Time 1245    PT Stop Time 1330    PT Time Calculation (min) 45 min    Activity Tolerance Patient tolerated treatment well    Behavior During Therapy WFL for tasks assessed/performed               Past Medical History:  Diagnosis Date   Anterolisthesis    Cervical spine   Aortic arch aneurysm (HCC)    Asthma    Connective tissue disorder (HCC)    recurrent carotid arteritis, temporal arteritis, vasculitis mandible, general hepatitis, avascular necrosis bilat   DDD (degenerative disc disease), lumbar    Diabetes mellitus without complication (HCC)    Diverticulosis    Duodenitis    Ganglion cyst of right foot    Gouty arthritis    Hiatal hernia with GERD    History of cardiovascular stress test    a. 07/2018 [facility]): Fixed inferoapical defect w/ nl contraction-->attenuation artifact. No ischemia. EF 63%.   Hyperlipidemia    Hypertension    a. 02/2019 Renal artery duplex: no evidence of prox RAS.   Hyperuricemia    Keratitis sicca, bilateral (HCC)    Lymphedema of both lower extremities    Osteoarthritis    Pericarditis    Recurrent: Aug 97, July 05, Sept 08, July 16, July 18   PUD (peptic ulcer disease)    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HCC)    Sjogren's syndrome (HCC)    Syncope    Tendonitis, Achilles, right    Tortuous colon    Trigger finger    Past Surgical History:  Procedure Laterality Date   ABDOMINAL HYSTERECTOMY     BREAST BIOPSY     BREAST EXCISIONAL BIOPSY Left 1986   neg   CHOLECYSTECTOMY     COLONOSCOPY WITH PROPOFOL N/A 08/26/2018   Procedure:  COLONOSCOPY WITH PROPOFOL;  Surgeon: Midge Minium, MD;  Location: [facility] ENDOSCOPY;  Service: Endoscopy;  Laterality: N/A;   CYSTOSCOPY  02/26/2018   Anola Gurney, MD    distal arthrectomy     ESOPHAGOGASTRODUODENOSCOPY (EGD) WITH PROPOFOL N/A 08/26/2018   Procedure: ESOPHAGOGASTRODUODENOSCOPY (EGD) WITH PROPOFOL;  Surgeon: Midge Minium, MD;  Location: [facility] ENDOSCOPY;  Service: Endoscopy;  Laterality: N/A;   EXCISION NEUROMA     KNEE SURGERY Bilateral    1985, 2001, 11/2002, 12/2006   panhysterectomy  10/1983   SHOULDER SURGERY Right    reverse total arthroplasty w/ biceps tenodesis   TONSILLECTOMY AND ADENOIDECTOMY     TOTAL HIP ARTHROPLASTY Right 04/2007   WISDOM TOOTH EXTRACTION     Patient Active Problem List   Diagnosis Date Noted   Abdominal pain 07/05/2021   H/O total hip arthroplasty, right 07/12/2020   Status post left partial knee replacement 07/12/2020   Nausea 03/31/2020   Multiple thyroid nodules 12/28/2019   Thyroid nodule 09/29/2019   Lymphedema 11/10/2018   PAD (peripheral artery disease) (HCC) 10/12/2018   Aortic atherosclerosis (HCC) 10/12/2018   Carotid stenosis, asymptomatic, bilateral 10/12/2018   Positive colorectal cancer screening using Cologuard test 10/09/2018  Gastroesophageal reflux disease    Acute gastritis without hemorrhage    Osteoarthritis 04/29/2018   Hiatal hernia with GERD 04/29/2018   Hyperlipidemia 04/29/2018   Lymphedema of both lower extremities 04/29/2018   Left knee pain 10/23/2016   Cataracts, bilateral 07/31/2016   Glaucoma 07/31/2016   Lateral epicondylitis of left elbow 10/20/2015   Vitamin D deficiency 10/20/2015   Type 2 diabetes, controlled, with neuropathy (HCC) 10/16/2015   Type II diabetes mellitus with complication (HCC) 10/16/2015   Gouty arthritis 06/03/2015   H/O syncope 06/03/2015   Mild intermittent asthma 06/03/2015   Multiple gastric ulcers 06/03/2015   Schatzki's ring 06/03/2015   Undifferentiated connective  tissue disease (HCC) 06/03/2015   Bone disorder 04/05/2015   Dental injury 04/05/2015   Pericarditis 04/05/2015   Sjogren's syndrome (HCC) 09/07/2014   Elevated alkaline phosphatase level 08/24/2014   Lactose intolerance 08/24/2014   Obesity 08/24/2014   Peripheral edema 08/24/2014   Benign 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 Judithann Sheen  REFERRING PROVIDER: Dr. Judithann Sheen  REFERRING DIAG: Ataxia  THERAPY DIAG:  Muscle weakness (generalized)  Chronic pain syndrome  Rationale for Evaluation and Treatment: Rehabilitation  ONSET DATE: June 2024  SUBJECTIVE: From initial evaluation  SUBJECTIVE STATEMENT: See initial evaluation  PERTINENT HISTORY: She reports she went on a boat at [facility] in June, 2024; a few days later she had an "episode" where "everything hit her"; she describes it as being painful in her back, hips, and legs.  She reports there was no falls or any specific injury or incident that happened while on the boat that day. She had a f/u with her PCP after this "episode" for her annual wellness visit and mentioned it; she states she was referred to PT.  She has also been to chiropractor a few visits (not sure of tx) and it was "helpful" (not clear how or what was actually helpful). She feels like she is having trouble walking sometimes since this episode in June.  Overall, ISQ- not worsening or improving. She feels like she has difficulty walking up/down steps and walking around her house.   Aggravating  factors: lying supine, prolonged standing and walking- bother her back and hip pain.   Alleviating factors: "resting"   No specific localized area of pain; describes generalized low back, L hip (has h/o AVN and is waiting for hip replacement), and leg pain.  She does not use any assistive device for ambulation.    PMH: Avn L hip, medial L knee replacement, 2 arthroscopic surgeries on R knee, R THA; no lumbar spine surgeries; no major ankle surgeries  PAIN:  Are you having pain? No   PRECAUTIONS: Fall   WEIGHT BEARING RESTRICTIONS: No  FALLS:  Has patient fallen in last 6 months? No  PATIENT GOALS: "to get her mobility back"; to move around better without being limited by her back, hip pain  NEXT MD VISIT: unknown  TODAY'S TREATMENT 03/11/23 Subjective: Patient reports her roommate fell into her yesterday and she was able to catch herself and felt  like she didn't have issues with her balance.  She did not fall.    Pain: 4-5/10  Objective:  Therapeutic Exercises: (to promote lumbopelvic/hip ROM and strength) Nustep: seat #12, level 3 x 10 min- for hip/knee AROM and strength (hx taking during this), 4x 30 second intervals at level 4, for LE strengthening -front step ups with min UE support- lead with R LE 2x6 -standing hip abd: 2x12 R and L -SLS in parallel bars: 4-5 second attempts R and L x10 ea -Seated LAQ: 3# R LE 2x12 (not today) -Seated hip add with PT manual assistance: 2x10  -Supine hip abd with PT manual resistance: 2x10 -STS x10 (airex under hips) -seated lumbar flexion with blue physioball: x10, focus on x2 diaphragmatic breathing rounds in each stretch -Hooklying LTR: x10 ea direction, 2 sets (pt is very hypomobile today, improved with repetition)- not today -SAQ 2# ankle weights bilaterally 2x15- not today -obstacle course: figure 8s and forward/lateral step over cones to work on SLS proprioception and single leg CKC hip mm and knee mm dynamic strength- 5 min in  hallway   Manual Therapy: (for L hip/low back) not today Manual gentle long axis traction L LE for pain control as pt notes this is helpful: 10 second bouts, repeated x 5 min in various positions Gentle PROM hip flexion, abd, add within available ROM; long axis IR/ER x10 ea, 2 rounds (+) painful MTP's R lumbar paraspinals- performed STM R>L lumbar spine paraspinal mm x 5 min at end of session (not today)   PATIENT EDUCATION:  Education details: HEP instruction, exercise technique/form  HOME EXERCISE PROGRAM: Access Code: [email or web address] URL: [email or web address] Date: 01/28/2023 Prepared by: Max Fickle  Exercises - Supine Lower Trunk Rotation with PLB  - 1 x daily - 7 x weekly - 2 sets - 10 reps - Supine Hip Adduction Isometric with Ball  - 1 x daily - 7 x weekly - 2 sets - 10 reps - Sit to Stand  - 1 x daily - 7 x weekly - 2 sets - 5 reps  ASSESSMENT:  CLINICAL IMPRESSION:  Pt tolerated dynamic LE strengthening with emphasis on changing directions, navigating obstacles, and dual tasking to simulate "real life" scenarios at home and out in community well today.  No major loss of balance noted during session and pt reports no increase in L hip or R knee pain during or at end of session.  Pt overall is making slow steady progress towards PT goals.  Patient's condition has the potential to improve in response to therapy. Maximum improvement is yet to be obtained. The anticipated improvement is attainable and reasonable in a generally predictable time. Patient will benefit from skilled PT to maximize return to PLOF.   OBJECTIVE IMPAIRMENTS: Abnormal gait, cardiopulmonary status limiting activity, decreased activity tolerance, decreased balance, decreased endurance, decreased mobility, difficulty walking, decreased ROM, decreased strength, and pain.   ACTIVITY LIMITATIONS: carrying, lifting, bending, squatting, sleeping, stairs, continence, and locomotion level  PARTICIPATION  LIMITATIONS: meal prep, cleaning, laundry, and community activity  PERSONAL FACTORS: Age, Behavior pattern, Past/current experiences, Time since onset of injury/illness/exacerbation, and 3+ comorbidities: cardiac, autoimmune, DM, R THA, L hip AVN, R knee OA, lymphedema, see PMH above  are also affecting patient's functional outcome.   REHAB POTENTIAL: Good  CLINICAL DECISION MAKING: Evolving/moderate complexity  EVALUATION COMPLEXITY: Moderate   GOALS: Goals reviewed with patient? Yes  SHORT TERM GOALS: Target date: 01/24/23 Pt will be able to perform HEP for LE strengthening 3x/week Baseline: to  be initiated at future visit; 9/16: patient reports doing a little of her HEP each day "depending on what hurts"  Goal status: MET    LONG TERM GOALS: Target date: 04/08/23  Improve FOTO to predicted DC level indicating improved ability to perform her daily activities without being limited by chronic pain Baseline: to be administered at visit #2; 9/16: 43 Goal status: ONGOING   2.  Pt will be able to participate in 15 minutes therapeutic exercise for LE strength and balance at RPE level 4-6 without a seated rest break to improve her overall ability to perform her daily activities at home that require standing (kitchen/meal prep/chores)without being limited by chronic pain Baseline: 3-5 minutes; 9/16: ~10 minutes Goal status: ONGOING  3.  Improve 5x STS to <20 seconds indicating improved LE strength and reduced fall risk Baseline: 31(with use of UE support); 9/16; 35.3 seconds (with hands on knees) Goal status: ONGOING  4. Patient will increase distance by 200' for progression to community ambulator and improve gait ability  Baseline: 9/16: 735'   Goal status: INITIAL   PLAN:  PT FREQUENCY: 2x/week  PT DURATION: 6 weeks  PLANNED INTERVENTIONS: Therapeutic exercises, Therapeutic activity, Neuromuscular re-education, Balance training, Gait training, Patient/Family education,  Self Care, and Joint mobilization  PLAN FOR NEXT SESSION: functional LE strength/balance/gait training.  Emphasize R quadriceps strengthening to stair navigation.  STM R lumbar paraspinals.  Ardine Bjork, PT, DPT 03/11/2023, 12:59 PM   12:59 PM, 03/11/23

## 2023-03-13 ENCOUNTER — Ambulatory Visit: Payer: Medicare Other

## 2023-03-18 ENCOUNTER — Ambulatory Visit: Payer: Medicare Other | Attending: Internal Medicine

## 2023-03-18 ENCOUNTER — Ambulatory Visit
Admission: EM | Admit: 2023-03-18 | Discharge: 2023-03-18 | Disposition: A | Discharge status: Home / Self Care | Payer: Medicare Other | Attending: Emergency Medicine | Admitting: Emergency Medicine

## 2023-03-18 DIAGNOSIS — M6281 Muscle weakness (generalized): Secondary | ICD-10-CM | POA: Insufficient documentation

## 2023-03-18 DIAGNOSIS — G894 Chronic pain syndrome: Secondary | ICD-10-CM | POA: Insufficient documentation

## 2023-03-18 DIAGNOSIS — N390 Urinary tract infection, site not specified: Secondary | ICD-10-CM

## 2023-03-18 DIAGNOSIS — R262 Difficulty in walking, not elsewhere classified: Secondary | ICD-10-CM | POA: Insufficient documentation

## 2023-03-18 LAB — URINALYSIS, W/ REFLEX TO CULTURE (INFECTION SUSPECTED)
Bilirubin Urine: NEGATIVE
Glucose, UA: NEGATIVE mg/dL
Hgb urine dipstick: NEGATIVE
Ketones, ur: NEGATIVE mg/dL
Nitrite: NEGATIVE
Protein, ur: NEGATIVE mg/dL
Specific Gravity, Urine: 1.015 (ref 1.005–1.030)
pH: 5.5 (ref 5.0–8.0)

## 2023-03-18 MED ORDER — FOSFOMYCIN TROMETHAMINE 3 G PO PACK: PACK | ORAL | 0 refills | Status: DC

## 2023-03-18 NOTE — Discharge Instructions (Signed)
You urine is positive for UTI. Take antibiotic as directed. Drink plenty of water, follow up with PCP.   Go to Er for new or worsening issues or concerns(fever, nausea, vomiting, unable to keep meds down, muscle aches, etc)

## 2023-03-18 NOTE — ED Provider Notes (Signed)
[facility]    CSN: [id number] Arrival date & time: 03/18/23  1354      History   Chief Complaint Chief Complaint  Patient presents with   uti    HPI [name] is a 77 y.o. female.    77 year old female, Danielys Madry,  with past medical history significant for type 2 diabetes, hyperlipidemia, glaucoma, and mild remittent asthma, presents to urgent care for possible urinary urgency, frequency, and incontinence. Pt states to her autoimmune dx and incontinence cannot tell if she has frequency or not, "feels bad".    The history is provided by the patient. No language interpreter was used.    Past Medical History:  Diagnosis Date   Anterolisthesis    Cervical spine   Aortic arch aneurysm (HCC)    Asthma    Connective tissue disorder (HCC)    recurrent carotid arteritis, temporal arteritis, vasculitis mandible, general hepatitis, avascular necrosis bilat   DDD (degenerative disc disease), lumbar    Diabetes mellitus without complication (HCC)    Diverticulosis    Duodenitis    Ganglion cyst of right foot    Gouty arthritis    Hiatal hernia with GERD    History of cardiovascular stress test    a. 07/2018 [facility]): Fixed inferoapical defect w/ nl contraction-->attenuation artifact. No ischemia. EF 63%.   Hyperlipidemia    Hypertension    a. 02/2019 Renal artery duplex: no evidence of prox RAS.   Hyperuricemia    Keratitis sicca, bilateral    Lymphedema of both lower extremities    Osteoarthritis    Pericarditis    Recurrent: Aug 97, July 05, Sept 08, July 16, July 18   PUD (peptic ulcer disease)    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HCC)    Sjogren's syndrome (HCC)    Syncope    Tendonitis, Achilles, right    Tortuous colon    Trigger finger     Patient Active Problem List   Diagnosis Date Noted   Acute UTI 03/18/2023   Abdominal pain 07/05/2021   H/O total hip arthroplasty, right 07/12/2020   Status post left partial knee replacement 07/12/2020    Nausea 03/31/2020   Multiple thyroid nodules 12/28/2019   Thyroid nodule 09/29/2019   Lymphedema 11/10/2018   PAD (peripheral artery disease) (HCC) 10/12/2018   Aortic atherosclerosis (HCC) 10/12/2018   Carotid stenosis, asymptomatic, bilateral 10/12/2018   Positive colorectal cancer screening using Cologuard test 10/09/2018   Gastroesophageal reflux disease    Acute gastritis without hemorrhage    Osteoarthritis 04/29/2018   Hiatal hernia with GERD 04/29/2018   Hyperlipidemia 04/29/2018   Lymphedema of both lower extremities 04/29/2018   Left knee pain 10/23/2016   Cataracts, bilateral 07/31/2016   Glaucoma 07/31/2016   Lateral epicondylitis of left elbow 10/20/2015   Vitamin D deficiency 10/20/2015   Type 2 diabetes, controlled, with neuropathy (HCC) 10/16/2015   Type II diabetes mellitus with complication (HCC) 10/16/2015   Gouty arthritis 06/03/2015   H/O syncope 06/03/2015   Mild intermittent asthma 06/03/2015   Multiple gastric ulcers 06/03/2015   Schatzki's ring 06/03/2015   Undifferentiated connective tissue disease (HCC) 06/03/2015   Bone disorder 04/05/2015   Dental injury 04/05/2015   Pericarditis 04/05/2015   Sjogren's syndrome (HCC) 09/07/2014   Elevated alkaline phosphatase level 08/24/2014   Lactose intolerance 08/24/2014   Obesity 08/24/2014   Peripheral edema 08/24/2014   Benign hypertension with CKD (chronic kidney disease) stage III 08/03/2014   Abdominal adhesions 08/02/2014  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HCC) 08/02/2014   Degenerative joint disease (DJD) of lumbar spine 08/02/2014   Diverticulosis of both small and large intestine 08/02/2014   Hyperuricemia 08/02/2014   Pure hypercholesterolemia 08/02/2014   Trigger finger 08/02/2014   Right shoulder pain 07/29/2014   Benign neoplasm of colon 06/30/2010   Right upper quadrant pain 06/30/2010    Past Surgical History:  Procedure Laterality Date   ABDOMINAL HYSTERECTOMY     BREAST  BIOPSY     BREAST EXCISIONAL BIOPSY Left 1986   neg   CHOLECYSTECTOMY     COLONOSCOPY WITH PROPOFOL N/A 08/26/2018   Procedure: COLONOSCOPY WITH PROPOFOL;  Surgeon: Midge Minium, MD;  Location: [facility] ENDOSCOPY;  Service: Endoscopy;  Laterality: N/A;   CYSTOSCOPY  02/26/2018   Anola Gurney, MD    distal arthrectomy     ESOPHAGOGASTRODUODENOSCOPY (EGD) WITH PROPOFOL N/A 08/26/2018   Procedure: ESOPHAGOGASTRODUODENOSCOPY (EGD) WITH PROPOFOL;  Surgeon: Midge Minium, MD;  Location: [facility] ENDOSCOPY;  Service: Endoscopy;  Laterality: N/A;   EXCISION NEUROMA     KNEE SURGERY Bilateral    1985, 2001, 11/2002, 12/2006   panhysterectomy  10/1983   SHOULDER SURGERY Right    reverse total arthroplasty w/ biceps tenodesis   TONSILLECTOMY AND ADENOIDECTOMY     TOTAL HIP ARTHROPLASTY Right 04/2007   WISDOM TOOTH EXTRACTION      OB History   No obstetric history on file.      Home Medications    Prior to Admission medications   Medication Sig Start Date End Date Taking? Authorizing Provider  fosfomycin (MONUROL) 3 g PACK Take 1 pack every 48 hours x 3 doses 03/18/23  Yes Hawke Villalpando, Para March, NP  acetaminophen (TYLENOL) 500 MG tablet Take 500 mg by mouth daily as needed.    [provider]  albuterol (VENTOLIN HFA) 108 (90 Base) MCG/ACT inhaler Inhale 1-2 puffs into the lungs every 4 (four) hours as needed for wheezing or shortness of breath. 06/03/21   Shirlee Latch, PA-C  ARTIFICIAL TEAR OP Apply to eye as needed.     [provider]  BD INSULIN SYRINGE U/F 31G X 5/16" 0.5 ML MISC USE AS DIRECTED Patient not taking: Reported on 09/03/2022 08/30/20   Marguarite Arbour, MD  BD INSULIN SYRINGE U/F 31G X 5/16" 1 ML MISC USE AS DIRECTED. WITH HUMALOG Patient not taking: Reported on 09/03/2022 07/28/18   Galen Manila, NP  BIOTIN PO Take 1 mg by mouth daily.  Patient not taking: Reported on 09/03/2022    [provider]  budesonide-formoterol (SYMBICORT) 80-4.5 MCG/ACT  inhaler Inhale 2 puffs into the lungs 2 (two) times daily. Patient not taking: Reported on 09/03/2022 06/09/21   Rodriguez-Southworth, Nettie Elm, PA-C  Cholecalciferol (VITAMIN D3) 25 MCG (1000 UT) CAPS Take 4 capsules by mouth daily.     [provider]  cloNIDine (CATAPRES) 0.1 MG tablet Take 1 tablet (0.1 mg total) by mouth 3 (three) times daily. 09/18/22 12/17/22  Antonieta Iba, MD  COLCRYS 0.6 MG tablet Take 1 tablet (0.6 mg total) by mouth 2 (two) times daily as needed. For up to 3 days for pericarditis, or up to 7 days for gout or connective tissue disorder. 01/14/20   Karamalegos, Netta Neat, DO  cyclobenzaprine (FLEXERIL) 5 MG tablet Take 1 tablet (5 mg total) by mouth daily as needed for muscle spasms. 05/26/20   Malfi, Jodelle Gross, FNP  doxazosin (CARDURA) 1 MG tablet Take 1 tablet by mouth at bedtime.  10/26/22 10/26/23  [provider]  ezetimibe (ZETIA) 10 MG tablet TAKE 1 TABLET BY MOUTH DAILY Patient not taking: Reported on 09/03/2022 02/26/22   Antonieta Iba, MD  famotidine (PEPCID) 40 MG tablet Take 40 mg by mouth 2 (two) times daily.    [provider]  FREESTYLE LITE test strip USE AS DIRECTED THREE TIMES DAILY FOR DIABETES 03/03/20   Tarri Fuller, FNP  insulin degludec (TRESIBA FLEXTOUCH) 100 UNIT/ML FlexTouch Pen Inject 72 Units into the skin daily.    [provider]  insulin lispro (HUMALOG) 100 UNIT/ML injection Correction scale: take 1 unit per 50 over 150 before meals up to three times daily.  Up to 20 units per day. 02/12/19   Galen Manila, NP  Insulin Pen Needle (PEN NEEDLES) 32G X 5 MM MISC 1 Device by Does not apply route daily. Patient not taking: Reported on 09/03/2022 08/26/19   Tarri Fuller, FNP  lactase (LACTAID) 3000 units tablet Take by mouth as needed.     [provider]  Lancets (FREESTYLE) lancets Must test x4/day Patient not taking: Reported on 09/03/2022 06/19/16   [provider]  nebivolol (BYSTOLIC)  10 MG tablet Take 1 tablet (10 mg total) by mouth 2 (two) times daily. 12/26/22   Cyndi Bender, NP  ondansetron (ZOFRAN-ODT) 4 MG disintegrating tablet Take 1 tablet (4 mg total) by mouth every 8 (eight) hours as needed for nausea or vomiting. Patient not taking: Reported on 09/03/2022 03/29/20   Tarri Fuller, FNP  phenazopyridine (PYRIDIUM) 200 MG tablet Take 1 tablet (200 mg total) by mouth 3 (three) times daily. Patient not taking: Reported on 09/03/2022 03/27/22   Becky Augusta, NP  rosuvastatin (CRESTOR) 10 MG tablet Take 1 tablet (10 mg total) by mouth daily. 09/03/22   Antonieta Iba, MD    Family History Family History  Problem Relation Age of Onset   Hypertension Mother    Diabetes Mother    Hyperlipidemia Mother    Congestive Heart Failure Mother    Cancer Mother    Non-Hodgkin's lymphoma Sister    Cancer Sister    Diabetes Maternal Grandmother    Breast cancer Neg Hx     Social History Social History   Tobacco Use   Smoking status: Former    Current packs/day: 0.00    Types: Cigarettes    Quit date: 01/22/1996    Years since quitting: 27.1   Smokeless tobacco: Never  Vaping Use   Vaping status: Never Used  Substance Use Topics   Alcohol use: Yes    Comment: occasional-once every 6 months   Drug use: Never     Allergies   Amlodipine, Aspirin, Bee venom, Ciprofloxacin, Codeine, Egg-derived products, Erythromycin, Glimepiride, Hydralazine, Hydrochlorothiazide, Hydrocodone, Hydrocodone-acetaminophen, Hydromorphone, Irbesartan, Irbesartan-hydrochlorothiazide, Lisinopril, Maxitrol [neomycin-polymyxin-dexameth], Metformin and related, Metformin hcl, Methylcellulose, Metoclopramide, Metoprolol, Minoxidil, Neomycin-bacitracin zn-polymyx, Norvasc [amlodipine besylate], Nsaids, Omeprazole magnesium, Other, Oxycodone-acetaminophen, Penicillins, Pioglitazone, Poison sumac extract, Prilosec [omeprazole], Silver sulfadiazine, Spironolactone, Sulfa antibiotics, Tape, Tetanus  toxoid, Tramadol, and Betadine [povidone iodine]   Review of Systems Review of Systems  Constitutional:  Positive for activity change.  Genitourinary:  Positive for frequency and urgency.       Urinary incontinence  All other systems reviewed and are negative.    Physical Exam Triage Vital Signs ED Triage Vitals [03/18/23 1435]  Encounter Vitals Group     BP (!) 146/77     Systolic BP Percentile      Diastolic BP Percentile  Pulse Rate 74     Resp 19     Temp 98.6 F (37 C)     Temp Source Oral     SpO2 97 %     Weight      Height      Head Circumference      Peak Flow      Pain Score      Pain Loc      Pain Education      Exclude from Growth Chart    No data found.  Updated Vital Signs BP (!) 146/77 (BP Location: Left Arm)   Pulse 74   Temp 98.6 F (37 C) (Oral)   Resp 19   SpO2 97%   Visual Acuity Right Eye Distance:   Left Eye Distance:   Bilateral Distance:    Right Eye Near:   Left Eye Near:    Bilateral Near:     Physical Exam Vitals and nursing note reviewed.  Constitutional:      General: She is not in acute distress.    Appearance: She is well-developed and well-groomed.  HENT:     Head: Normocephalic and atraumatic.  Eyes:     Conjunctiva/sclera: Conjunctivae normal.  Cardiovascular:     Rate and Rhythm: Normal rate and regular rhythm.     Pulses: Normal pulses.     Heart sounds: Normal heart sounds. No murmur heard. Pulmonary:     Effort: Pulmonary effort is normal. No respiratory distress.     Breath sounds: Normal breath sounds and air entry.  Abdominal:     Palpations: Abdomen is soft.     Tenderness: There is no abdominal tenderness.  Musculoskeletal:        General: No swelling.     Cervical back: Neck supple.  Skin:    General: Skin is warm and dry.     Capillary Refill: Capillary refill takes less than 2 seconds.  Neurological:     General: No focal deficit present.     Mental Status: She is alert and oriented to  person, place, and time.     GCS: GCS eye subscore is 4. GCS verbal subscore is 5. GCS motor subscore is 6.     Cranial Nerves: No cranial nerve deficit.     Sensory: No sensory deficit.  Psychiatric:        Attention and Perception: Attention normal.        Mood and Affect: Mood normal.        Speech: Speech normal.        Behavior: Behavior normal. Behavior is cooperative.      UC Treatments / Results  Labs (all labs ordered are listed, but only abnormal results are displayed) Labs Reviewed  URINALYSIS, W/ REFLEX TO CULTURE (INFECTION SUSPECTED) - Abnormal; Notable for the following components:      Result Value   Leukocytes,Ua TRACE (*)    Bacteria, UA FEW (*)    All other components within normal limits  URINE CULTURE    EKG   Radiology No results found.  Procedures Procedures (including critical care time)  Medications Ordered in UC Medications - No data to display  Initial Impression / Assessment and Plan / UC Course  I have reviewed the triage vital signs and the nursing notes.  Pertinent labs & imaging results that were available during my care of the patient were reviewed by me and considered in my medical decision making (see chart for details).    Due  to pt allergies pt scripted fosfomycin.Discussed exam findings and plan of care with patient, strict go to ER precautions given.   Patient verbalized understanding to this provider.   Ddx: Acute uti, viral illness Final Clinical Impressions(s) / UC Diagnoses   Final diagnoses:  Acute UTI     Discharge Instructions      You urine is positive for UTI. Take antibiotic as directed. Drink plenty of water, follow up with PCP.   Go to Er for new or worsening issues or concerns(fever, nausea, vomiting, unable to keep meds down, muscle aches, etc)     ED Prescriptions     Medication Sig Dispense Auth. Provider   fosfomycin (MONUROL) 3 g PACK Take 1 pack every 48 hours x 3 doses 3 g Lorain Keast,  NP      PDMP not reviewed this encounter.   Clancy Gourd, NP 03/18/23 2051

## 2023-03-18 NOTE — ED Triage Notes (Signed)
Patient states that she just feels bad. Can't tell if she is having urinary frequency.

## 2023-03-18 NOTE — Therapy (Signed)
OUTPATIENT PHYSICAL THERAPY LOWER TREATMENT   Patient Name: [name] MRN: [id number] DOB:02/21/1946, 77 y.o., female Today's Date: 03/18/2023  END OF SESSION:  PT End of Session - 03/18/23 1302     Visit Number 13    Number of Visits 20    Date for PT Re-Evaluation 04/08/23    Authorization Type Medicare; 2x/week x 4-6 weeks (04/08/23)    Progress Note Due on Visit 10    PT Start Time 1245    PT Stop Time 1330    PT Time Calculation (min) 45 min    Activity Tolerance Patient tolerated treatment well    Behavior During Therapy WFL for tasks assessed/performed               Past Medical History:  Diagnosis Date   Anterolisthesis    Cervical spine   Aortic arch aneurysm (HCC)    Asthma    Connective tissue disorder (HCC)    recurrent carotid arteritis, temporal arteritis, vasculitis mandible, general hepatitis, avascular necrosis bilat   DDD (degenerative disc disease), lumbar    Diabetes mellitus without complication (HCC)    Diverticulosis    Duodenitis    Ganglion cyst of right foot    Gouty arthritis    Hiatal hernia with GERD    History of cardiovascular stress test    a. 07/2018 [facility]): Fixed inferoapical defect w/ nl contraction-->attenuation artifact. No ischemia. EF 63%.   Hyperlipidemia    Hypertension    a. 02/2019 Renal artery duplex: no evidence of prox RAS.   Hyperuricemia    Keratitis sicca, bilateral (HCC)    Lymphedema of both lower extremities    Osteoarthritis    Pericarditis    Recurrent: Aug 97, July 05, Sept 08, July 16, July 18   PUD (peptic ulcer disease)    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HCC)    Sjogren's syndrome (HCC)    Syncope    Tendonitis, Achilles, right    Tortuous colon    Trigger finger    Past Surgical History:  Procedure Laterality Date   ABDOMINAL HYSTERECTOMY     BREAST BIOPSY     BREAST EXCISIONAL BIOPSY Left 1986   neg   CHOLECYSTECTOMY     COLONOSCOPY WITH PROPOFOL N/A 08/26/2018   Procedure:  COLONOSCOPY WITH PROPOFOL;  Surgeon: Midge Minium, MD;  Location: [facility] ENDOSCOPY;  Service: Endoscopy;  Laterality: N/A;   CYSTOSCOPY  02/26/2018   Anola Gurney, MD    distal arthrectomy     ESOPHAGOGASTRODUODENOSCOPY (EGD) WITH PROPOFOL N/A 08/26/2018   Procedure: ESOPHAGOGASTRODUODENOSCOPY (EGD) WITH PROPOFOL;  Surgeon: Midge Minium, MD;  Location: [facility] ENDOSCOPY;  Service: Endoscopy;  Laterality: N/A;   EXCISION NEUROMA     KNEE SURGERY Bilateral    1985, 2001, 11/2002, 12/2006   panhysterectomy  10/1983   SHOULDER SURGERY Right    reverse total arthroplasty w/ biceps tenodesis   TONSILLECTOMY AND ADENOIDECTOMY     TOTAL HIP ARTHROPLASTY Right 04/2007   WISDOM TOOTH EXTRACTION     Patient Active Problem List   Diagnosis Date Noted   Abdominal pain 07/05/2021   H/O total hip arthroplasty, right 07/12/2020   Status post left partial knee replacement 07/12/2020   Nausea 03/31/2020   Multiple thyroid nodules 12/28/2019   Thyroid nodule 09/29/2019   Lymphedema 11/10/2018   PAD (peripheral artery disease) (HCC) 10/12/2018   Aortic atherosclerosis (HCC) 10/12/2018   Carotid stenosis, asymptomatic, bilateral 10/12/2018   Positive colorectal cancer screening using Cologuard test 10/09/2018  Gastroesophageal reflux disease    Acute gastritis without hemorrhage    Osteoarthritis 04/29/2018   Hiatal hernia with GERD 04/29/2018   Hyperlipidemia 04/29/2018   Lymphedema of both lower extremities 04/29/2018   Left knee pain 10/23/2016   Cataracts, bilateral 07/31/2016   Glaucoma 07/31/2016   Lateral epicondylitis of left elbow 10/20/2015   Vitamin D deficiency 10/20/2015   Type 2 diabetes, controlled, with neuropathy (HCC) 10/16/2015   Type II diabetes mellitus with complication (HCC) 10/16/2015   Gouty arthritis 06/03/2015   H/O syncope 06/03/2015   Mild intermittent asthma 06/03/2015   Multiple gastric ulcers 06/03/2015   Schatzki's ring 06/03/2015   Undifferentiated connective  tissue disease (HCC) 06/03/2015   Bone disorder 04/05/2015   Dental injury 04/05/2015   Pericarditis 04/05/2015   Sjogren's syndrome (HCC) 09/07/2014   Elevated alkaline phosphatase level 08/24/2014   Lactose intolerance 08/24/2014   Obesity 08/24/2014   Peripheral edema 08/24/2014   Benign 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 Judithann Sheen  REFERRING PROVIDER: Dr. Judithann Sheen  REFERRING DIAG: Ataxia  THERAPY DIAG:  Muscle weakness (generalized)  Chronic pain syndrome  Rationale for Evaluation and Treatment: Rehabilitation  ONSET DATE: June 2024  SUBJECTIVE: From initial evaluation  SUBJECTIVE STATEMENT: See initial evaluation  PERTINENT HISTORY: She reports she went on a boat at [facility] in June, 2024; a few days later she had an "episode" where "everything hit her"; she describes it as being painful in her back, hips, and legs.  She reports there was no falls or any specific injury or incident that happened while on the boat that day. She had a f/u with her PCP after this "episode" for her annual wellness visit and mentioned it; she states she was referred to PT.  She has also been to chiropractor a few visits (not sure of tx) and it was "helpful" (not clear how or what was actually helpful). She feels like she is having trouble walking sometimes since this episode in June.  Overall, ISQ- not worsening or improving. She feels like she has difficulty walking up/down steps and walking around her house.   Aggravating  factors: lying supine, prolonged standing and walking- bother her back and hip pain.   Alleviating factors: "resting"   No specific localized area of pain; describes generalized low back, L hip (has h/o AVN and is waiting for hip replacement), and leg pain.  She does not use any assistive device for ambulation.    PMH: Avn L hip, medial L knee replacement, 2 arthroscopic surgeries on R knee, R THA; no lumbar spine surgeries; no major ankle surgeries  PAIN:  Are you having pain? No   PRECAUTIONS: Fall   WEIGHT BEARING RESTRICTIONS: No  FALLS:  Has patient fallen in last 6 months? No  PATIENT GOALS: "to get her mobility back"; to move around better without being limited by her back, hip pain  NEXT MD VISIT: unknown  TODAY'S TREATMENT 03/18/23 Subjective: Patient reports she was sick and this is why she missed PT last visit.  She is  feeling up for PT today.  She did some work trimming the shrubs outside her house this week for the first time in awhile.    Pain: 4-5/10  Objective:  Therapeutic Exercises: (to promote lumbopelvic/hip ROM and strength) Nustep: seat #12, level 3 x 10 min- for hip/knee AROM and strength (hx taking during this), 4x 30 second intervals at level 4, for LE strengthening -front step ups with min UE support- lead with R LE 2x10 -stair ascent/descent 2 laps with b/l rail -standing hip abd: 2x12 R and L 3#- not today -SLS in parallel bars: 4-5 second attempts R and L x10 ea- not today -Seated hip add with PT manual assistance: 2x10- ball today -Supine hip abd with PT manual resistance: 2x10- black band today -STS x15 (airex under hips) -seated lumbar flexion with blue physioball: x10, focus on x2 diaphragmatic breathing rounds in each stretch -Hooklying LTR: x10 ea direction, 2 sets (pt is very hypomobile today, improved with repetition)- not today -LAQ 3# ankle weights bilaterally 2x15 -Seated marches 3#: 2x15 -obstacle course: figure 8s and  forward/lateral step over cones to work on SLS proprioception and single leg CKC hip mm and knee mm dynamic strength- 5 min in hallway- not today   Manual Therapy: (for L hip/low back) not today Manual gentle long axis traction L LE for pain control as pt notes this is helpful: 10 second bouts, repeated x 5 min in various positions Gentle PROM hip flexion, abd, add within available ROM; long axis IR/ER x10 ea, 2 rounds (+) painful MTP's R lumbar paraspinals- performed STM R>L lumbar spine paraspinal mm x 5 min at end of session (not today)   PATIENT EDUCATION:  Education details: HEP instruction, exercise technique/form  HOME EXERCISE PROGRAM: Access Code: [email or web address] URL: [email or web address] Date: 01/28/2023 Prepared by: Max Fickle  Exercises - Supine Lower Trunk Rotation with PLB  - 1 x daily - 7 x weekly - 2 sets - 10 reps - Supine Hip Adduction Isometric with Ball  - 1 x daily - 7 x weekly - 2 sets - 10 reps - Sit to Stand  - 1 x daily - 7 x weekly - 2 sets - 5 reps  ASSESSMENT:  CLINICAL IMPRESSION:  Pt demonstrates improved LE strength/control on stairs today and reduced reliance on pulling herself up with arms on railings.  Pt overall is making slow steady progress towards PT goals.  Patient's condition has the potential to improve in response to therapy. Maximum improvement is yet to be obtained. The anticipated improvement is attainable and reasonable in a generally predictable time. Patient will benefit from skilled PT to maximize return to PLOF.   OBJECTIVE IMPAIRMENTS: Abnormal gait, cardiopulmonary status limiting activity, decreased activity tolerance, decreased balance, decreased endurance, decreased mobility, difficulty walking, decreased ROM, decreased strength, and pain.   ACTIVITY LIMITATIONS: carrying, lifting, bending, squatting, sleeping, stairs, continence, and locomotion level  PARTICIPATION LIMITATIONS: meal prep, cleaning, laundry, and  community activity  PERSONAL FACTORS: Age, Behavior pattern, Past/current experiences, Time since onset of injury/illness/exacerbation, and 3+ comorbidities: cardiac, autoimmune, DM, R THA, L hip AVN, R knee OA, lymphedema, see PMH above  are also affecting patient's functional outcome.   REHAB POTENTIAL: Good  CLINICAL DECISION MAKING: Evolving/moderate complexity  EVALUATION COMPLEXITY: Moderate   GOALS: Goals reviewed with patient? Yes  SHORT TERM GOALS: Target date: 01/24/23 Pt will be able to perform HEP for LE strengthening 3x/week Baseline: to be initiated at future visit; 9/16: patient reports doing a little of her  HEP each day "depending on what hurts"  Goal status: MET    LONG TERM GOALS: Target date: 04/08/23  Improve FOTO to predicted DC level indicating improved ability to perform her daily activities without being limited by chronic pain Baseline: to be administered at visit #2; 9/16: 43 Goal status: ONGOING   2.  Pt will be able to participate in 15 minutes therapeutic exercise for LE strength and balance at RPE level 4-6 without a seated rest break to improve her overall ability to perform her daily activities at home that require standing (kitchen/meal prep/chores)without being limited by chronic pain Baseline: 3-5 minutes; 9/16: ~10 minutes Goal status: ONGOING  3.  Improve 5x STS to <20 seconds indicating improved LE strength and reduced fall risk Baseline: 31(with use of UE support); 9/16; 35.3 seconds (with hands on knees) Goal status: ONGOING  4. Patient will increase distance by 200' for progression to community ambulator and improve gait ability  Baseline: 9/16: 735'   Goal status: INITIAL   PLAN:  PT FREQUENCY: 2x/week  PT DURATION: 6 weeks  PLANNED INTERVENTIONS: Therapeutic exercises, Therapeutic activity, Neuromuscular re-education, Balance training, Gait training, Patient/Family education, Self Care, and Joint mobilization  PLAN FOR NEXT  SESSION: functional LE strength/balance/gait training.  Emphasize R quadriceps strengthening to stair navigation.   Ardine Bjork, PT, DPT 03/18/2023, 1:03 PM   1:03 PM, 03/18/23

## 2023-03-20 LAB — URINE CULTURE: Culture: >=100000 — AB

## 2023-03-25 ENCOUNTER — Ambulatory Visit: Payer: Medicare Other

## 2023-03-25 DIAGNOSIS — M6281 Muscle weakness (generalized): Secondary | ICD-10-CM

## 2023-03-25 DIAGNOSIS — G894 Chronic pain syndrome: Secondary | ICD-10-CM

## 2023-03-25 NOTE — Therapy (Signed)
OUTPATIENT PHYSICAL THERAPY LOWER TREATMENT   Patient Name: [name] MRN: [id number] DOB:[birth year], 77 y.o., female Today's Date: 03/25/2023  END OF SESSION:  PT End of Session - 03/25/23 1418     Visit Number 14    Number of Visits 20    Date for PT Re-Evaluation 04/08/23    Authorization Type Medicare; 2x/week x 4-6 weeks (04/08/23)    Progress Note Due on Visit 10    PT Start Time 1417    PT Stop Time 1500    PT Time Calculation (min) 43 min    Activity Tolerance Patient tolerated treatment well    Behavior During Therapy WFL for tasks assessed/performed               Past Medical History:  Diagnosis Date   Anterolisthesis    Cervical spine   Aortic arch aneurysm (HCC)    Asthma    Connective tissue disorder (HCC)    recurrent carotid arteritis, temporal arteritis, vasculitis mandible, general hepatitis, avascular necrosis bilat   DDD (degenerative disc disease), lumbar    Diabetes mellitus without complication (HCC)    Diverticulosis    Duodenitis    Ganglion cyst of right foot    Gouty arthritis    Hiatal hernia with GERD    History of cardiovascular stress test    a. 07/2018 [facility]): Fixed inferoapical defect w/ nl contraction-->attenuation artifact. No ischemia. EF 63%.   Hyperlipidemia    Hypertension    a. 02/2019 Renal artery duplex: no evidence of prox RAS.   Hyperuricemia    Keratitis sicca, bilateral    Lymphedema of both lower extremities    Osteoarthritis    Pericarditis    Recurrent: Aug 97, July 05, Sept 08, July 16, July 18   PUD (peptic ulcer disease)    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HCC)    Sjogren's syndrome (HCC)    Syncope    Tendonitis, Achilles, right    Tortuous colon    Trigger finger    Past Surgical History:  Procedure Laterality Date   ABDOMINAL HYSTERECTOMY     BREAST BIOPSY     BREAST EXCISIONAL BIOPSY Left 1986   neg   CHOLECYSTECTOMY     COLONOSCOPY WITH PROPOFOL N/A 08/26/2018   Procedure: COLONOSCOPY  WITH PROPOFOL;  Surgeon: Midge Minium, MD;  Location: [facility] ENDOSCOPY;  Service: Endoscopy;  Laterality: N/A;   CYSTOSCOPY  02/26/2018   Anola Gurney, MD    distal arthrectomy     ESOPHAGOGASTRODUODENOSCOPY (EGD) WITH PROPOFOL N/A 08/26/2018   Procedure: ESOPHAGOGASTRODUODENOSCOPY (EGD) WITH PROPOFOL;  Surgeon: Midge Minium, MD;  Location: [facility] ENDOSCOPY;  Service: Endoscopy;  Laterality: N/A;   EXCISION NEUROMA     KNEE SURGERY Bilateral    1985, 2001, 11/2002, 12/2006   panhysterectomy  10/1983   SHOULDER SURGERY Right    reverse total arthroplasty w/ biceps tenodesis   TONSILLECTOMY AND ADENOIDECTOMY     TOTAL HIP ARTHROPLASTY Right 04/2007   WISDOM TOOTH EXTRACTION     Patient Active Problem List   Diagnosis Date Noted   Acute UTI 03/18/2023   Abdominal pain 07/05/2021   H/O total hip arthroplasty, right 07/12/2020   Status post left partial knee replacement 07/12/2020   Nausea 03/31/2020   Multiple thyroid nodules 12/28/2019   Thyroid nodule 09/29/2019   Lymphedema 11/10/2018   PAD (peripheral artery disease) (HCC) 10/12/2018   Aortic atherosclerosis (HCC) 10/12/2018   Carotid stenosis, asymptomatic, bilateral 10/12/2018   Positive colorectal cancer screening using  Cologuard test 10/09/2018   Gastroesophageal reflux disease    Acute gastritis without hemorrhage    Osteoarthritis 04/29/2018   Hiatal hernia with GERD 04/29/2018   Hyperlipidemia 04/29/2018   Lymphedema of both lower extremities 04/29/2018   Left knee pain 10/23/2016   Cataracts, bilateral 07/31/2016   Glaucoma 07/31/2016   Lateral epicondylitis of left elbow 10/20/2015   Vitamin D deficiency 10/20/2015   Type 2 diabetes, controlled, with neuropathy (HCC) 10/16/2015   Type II diabetes mellitus with complication (HCC) 10/16/2015   Gouty arthritis 06/03/2015   H/O syncope 06/03/2015   Mild intermittent asthma 06/03/2015   Multiple gastric ulcers 06/03/2015   Schatzki's ring 06/03/2015   Undifferentiated  connective tissue disease (HCC) 06/03/2015   Bone disorder 04/05/2015   Dental injury 04/05/2015   Pericarditis 04/05/2015   Sjogren's syndrome (HCC) 09/07/2014   Elevated alkaline phosphatase level 08/24/2014   Lactose intolerance 08/24/2014   Obesity 08/24/2014   Peripheral edema 08/24/2014   Benign 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 Judithann Sheen  REFERRING PROVIDER: Dr. Judithann Sheen  REFERRING DIAG: Ataxia  THERAPY DIAG:  Muscle weakness (generalized)  Chronic pain syndrome  Rationale for Evaluation and Treatment: Rehabilitation  ONSET DATE: June 2024  SUBJECTIVE: From initial evaluation  SUBJECTIVE STATEMENT: See initial evaluation  PERTINENT HISTORY: She reports she went on a boat at [facility] in June, 2024; a few days later she had an "episode" where "everything hit her"; she describes it as being painful in her back, hips, and legs.  She reports there was no falls or any specific injury or incident that happened while on the boat that day. She had a f/u with her PCP after this "episode" for her annual wellness visit and mentioned it; she states she was referred to PT.  She has also been to chiropractor a few visits (not sure of tx) and it was "helpful" (not clear how or what was actually helpful). She feels like she is having trouble walking sometimes since this episode in June.  Overall, ISQ- not worsening or improving. She feels like she has difficulty walking up/down steps and walking around her house.    Aggravating factors: lying supine, prolonged standing and walking- bother her back and hip pain.   Alleviating factors: "resting"   No specific localized area of pain; describes generalized low back, L hip (has h/o AVN and is waiting for hip replacement), and leg pain.  She does not use any assistive device for ambulation.    PMH: Avn L hip, medial L knee replacement, 2 arthroscopic surgeries on R knee, R THA; no lumbar spine surgeries; no major ankle surgeries  PAIN:  Are you having pain? No   PRECAUTIONS: Fall   WEIGHT BEARING RESTRICTIONS: No  FALLS:  Has patient fallen in last 6 months? No  PATIENT GOALS: "to get her mobility back"; to move around better without being limited by her back, hip pain  NEXT MD VISIT: unknown  TODAY'S TREATMENT 03/25/23 Subjective: Patient reports she went to urgent care and had a UTI last  week.  On antibiotics.  Still feeling a little weak from the UTI.  Pain: 4-5/10  Objective:  Therapeutic Exercises: (to promote lumbopelvic/hip ROM and strength) Nustep: seat #12, level 3 x 10 min- for hip/knee AROM and strength (hx taking during this), 4x 30 second intervals at level 4, for LE strengthening  -front step ups with min UE support- lead with R LE x15  -stair ascent/descent 2 laps with b/l rail- not today -standing hip abd: 2x12 R and L -standing hip extension: 2x10 R and L -airex: tandem: 3x ea side 20 seconds with intermittent 2 fingers on the bar -airex: feet together: 30 seconds x 3 -SLS in parallel bars: 4-5 second attempts R and L x10 ea- not today -Seated hip add with PT manual assistance: 2x10- ball today -Supine hip abd with PT manual resistance: 2x10- black band today -STS x15 (airex under hips) -seated lumbar flexion with blue physioball: x10, focus on x2 diaphragmatic breathing rounds in each stretch -Hooklying LTR: x10 ea direction, 2 sets (pt is very hypomobile today, improved with repetition)- not today -LAQ 3# ankle weights  bilaterally 2x15 -Seated marches 3#: 2x15 -obstacle course: figure 8s and forward/lateral step over cones to work on SLS proprioception and single leg CKC hip mm and knee mm dynamic strength- 5 min in hallway- not today   Manual Therapy: (for L hip/low back) not today Manual gentle long axis traction L LE for pain control as pt notes this is helpful: 10 second bouts, repeated x 5 min in various positions Gentle PROM hip flexion, abd, add within available ROM; long axis IR/ER x10 ea, 2 rounds (+) painful MTP's R lumbar paraspinals- performed STM R>L lumbar spine paraspinal mm x 5 min at end of session (not today)   PATIENT EDUCATION:  Education details: HEP instruction, exercise technique/form  HOME EXERCISE PROGRAM: Access Code: [email or web address] URL: [email or web address] Date: 01/28/2023 Prepared by: Max Fickle  Exercises - Supine Lower Trunk Rotation with PLB  - 1 x daily - 7 x weekly - 2 sets - 10 reps - Supine Hip Adduction Isometric with Ball  - 1 x daily - 7 x weekly - 2 sets - 10 reps - Sit to Stand  - 1 x daily - 7 x weekly - 2 sets - 5 reps  ASSESSMENT:  CLINICAL IMPRESSION:  Pt demonstrates improved LE strength/control on stairs today and reduced reliance on pulling herself up with arms on railings.  Pt overall is making slow steady progress towards PT goals.  Patient's condition has the potential to improve in response to therapy. Maximum improvement is yet to be obtained. The anticipated improvement is attainable and reasonable in a generally predictable time. Patient will benefit from skilled PT to maximize return to PLOF.   OBJECTIVE IMPAIRMENTS: Abnormal gait, cardiopulmonary status limiting activity, decreased activity tolerance, decreased balance, decreased endurance, decreased mobility, difficulty walking, decreased ROM, decreased strength, and pain.   ACTIVITY LIMITATIONS: carrying, lifting, bending, squatting, sleeping, stairs, continence, and locomotion  level  PARTICIPATION LIMITATIONS: meal prep, cleaning, laundry, and community activity  PERSONAL FACTORS: Age, Behavior pattern, Past/current experiences, Time since onset of injury/illness/exacerbation, and 3+ comorbidities: cardiac, autoimmune, DM, R THA, L hip AVN, R knee OA, lymphedema, see PMH above  are also affecting patient's functional outcome.   REHAB POTENTIAL: Good  CLINICAL DECISION MAKING: Evolving/moderate complexity  EVALUATION COMPLEXITY: Moderate   GOALS: Goals reviewed with patient? Yes  SHORT TERM GOALS: Target date: 01/24/23 Pt will be able to perform HEP for LE strengthening  3x/week Baseline: to be initiated at future visit; 9/16: patient reports doing a little of her HEP each day "depending on what hurts"  Goal status: MET    LONG TERM GOALS: Target date: 04/08/23  Improve FOTO to predicted DC level indicating improved ability to perform her daily activities without being limited by chronic pain Baseline: to be administered at visit #2; 9/16: 43 Goal status: ONGOING   2.  Pt will be able to participate in 15 minutes therapeutic exercise for LE strength and balance at RPE level 4-6 without a seated rest break to improve her overall ability to perform her daily activities at home that require standing (kitchen/meal prep/chores)without being limited by chronic pain Baseline: 3-5 minutes; 9/16: ~10 minutes Goal status: ONGOING  3.  Improve 5x STS to <20 seconds indicating improved LE strength and reduced fall risk Baseline: 31(with use of UE support); 9/16; 35.3 seconds (with hands on knees) Goal status: ONGOING  4. Patient will increase distance by 200' for progression to community ambulator and improve gait ability  Baseline: 9/16: 735'   Goal status: INITIAL   PLAN:  PT FREQUENCY: 2x/week  PT DURATION: 6 weeks  PLANNED INTERVENTIONS: Therapeutic exercises, Therapeutic activity, Neuromuscular re-education, Balance training, Gait training,  Patient/Family education, Self Care, and Joint mobilization  PLAN FOR NEXT SESSION: functional LE strength/balance/gait training.  Emphasize R quadriceps strengthening to stair navigation.   Ardine Bjork, PT, DPT 03/25/2023, 2:19 PM   2:19 PM, 03/25/23

## 2023-03-27 ENCOUNTER — Ambulatory Visit: Payer: Medicare Other

## 2023-03-27 DIAGNOSIS — M6281 Muscle weakness (generalized): Secondary | ICD-10-CM | POA: Diagnosis not present

## 2023-03-27 DIAGNOSIS — R262 Difficulty in walking, not elsewhere classified: Secondary | ICD-10-CM

## 2023-03-27 NOTE — Therapy (Signed)
OUTPATIENT PHYSICAL THERAPY LOWER TREATMENT   Patient Name: [name] MRN: [id number] DOB:1945/07/28, 77 y.o., female Today's Date: 03/27/2023  END OF SESSION:  PT End of Session - 03/27/23 1211     Visit Number 15    Number of Visits 20    Date for PT Re-Evaluation 04/08/23    Authorization Type Medicare; 2x/week x 4-6 weeks (04/08/23)    Progress Note Due on Visit 10    PT Start Time 1200    PT Stop Time 1245    PT Time Calculation (min) 45 min    Activity Tolerance Patient tolerated treatment well    Behavior During Therapy WFL for tasks assessed/performed               Past Medical History:  Diagnosis Date   Anterolisthesis    Cervical spine   Aortic arch aneurysm (HCC)    Asthma    Connective tissue disorder (HCC)    recurrent carotid arteritis, temporal arteritis, vasculitis mandible, general hepatitis, avascular necrosis bilat   DDD (degenerative disc disease), lumbar    Diabetes mellitus without complication (HCC)    Diverticulosis    Duodenitis    Ganglion cyst of right foot    Gouty arthritis    Hiatal hernia with GERD    History of cardiovascular stress test    a. 07/2018 [facility]): Fixed inferoapical defect w/ nl contraction-->attenuation artifact. No ischemia. EF 63%.   Hyperlipidemia    Hypertension    a. 02/2019 Renal artery duplex: no evidence of prox RAS.   Hyperuricemia    Keratitis sicca, bilateral    Lymphedema of both lower extremities    Osteoarthritis    Pericarditis    Recurrent: Aug 97, July 05, Sept 08, July 16, July 18   PUD (peptic ulcer disease)    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HCC)    Sjogren's syndrome (HCC)    Syncope    Tendonitis, Achilles, right    Tortuous colon    Trigger finger    Past Surgical History:  Procedure Laterality Date   ABDOMINAL HYSTERECTOMY     BREAST BIOPSY     BREAST EXCISIONAL BIOPSY Left 1986   neg   CHOLECYSTECTOMY     COLONOSCOPY WITH PROPOFOL N/A 08/26/2018   Procedure: COLONOSCOPY  WITH PROPOFOL;  Surgeon: Midge Minium, MD;  Location: [facility] ENDOSCOPY;  Service: Endoscopy;  Laterality: N/A;   CYSTOSCOPY  02/26/2018   Anola Gurney, MD    distal arthrectomy     ESOPHAGOGASTRODUODENOSCOPY (EGD) WITH PROPOFOL N/A 08/26/2018   Procedure: ESOPHAGOGASTRODUODENOSCOPY (EGD) WITH PROPOFOL;  Surgeon: Midge Minium, MD;  [facility];  Service: Endoscopy;  Laterality: N/A;   EXCISION NEUROMA     KNEE SURGERY Bilateral    1985, 2001, 11/2002, 12/2006   panhysterectomy  10/1983   SHOULDER SURGERY Right    reverse total arthroplasty w/ biceps tenodesis   TONSILLECTOMY AND ADENOIDECTOMY     TOTAL HIP ARTHROPLASTY Right 04/2007   WISDOM TOOTH EXTRACTION     Patient Active Problem List   Diagnosis Date Noted   Acute UTI 03/18/2023   Abdominal pain 07/05/2021   H/O total hip arthroplasty, right 07/12/2020   Status post left partial knee replacement 07/12/2020   Nausea 03/31/2020   Multiple thyroid nodules 12/28/2019   Thyroid nodule 09/29/2019   Lymphedema 11/10/2018   PAD (peripheral artery disease) (HCC) 10/12/2018   Aortic atherosclerosis (HCC) 10/12/2018   Carotid stenosis, asymptomatic, bilateral 10/12/2018   Positive colorectal cancer screening using  Cologuard test 10/09/2018   Gastroesophageal reflux disease    Acute gastritis without hemorrhage    Osteoarthritis 04/29/2018   Hiatal hernia with GERD 04/29/2018   Hyperlipidemia 04/29/2018   Lymphedema of both lower extremities 04/29/2018   Left knee pain 10/23/2016   Cataracts, bilateral 07/31/2016   Glaucoma 07/31/2016   Lateral epicondylitis of left elbow 10/20/2015   Vitamin D deficiency 10/20/2015   Type 2 diabetes, controlled, with neuropathy (HCC) 10/16/2015   Type II diabetes mellitus with complication (HCC) 10/16/2015   Gouty arthritis 06/03/2015   H/O syncope 06/03/2015   Mild intermittent asthma 06/03/2015   Multiple gastric ulcers 06/03/2015   Schatzki's ring 06/03/2015   Undifferentiated  connective tissue disease (HCC) 06/03/2015   Bone disorder 04/05/2015   Dental injury 04/05/2015   Pericarditis 04/05/2015   Sjogren's syndrome (HCC) 09/07/2014   Elevated alkaline phosphatase level 08/24/2014   Lactose intolerance 08/24/2014   Obesity 08/24/2014   Peripheral edema 08/24/2014   Benign 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 Judithann Sheen  REFERRING PROVIDER: Dr. Judithann Sheen  REFERRING DIAG: Ataxia  THERAPY DIAG:  Muscle weakness (generalized)  Difficulty in walking, not elsewhere classified  Rationale for Evaluation and Treatment: Rehabilitation  ONSET DATE: June 2024  SUBJECTIVE: From initial evaluation  SUBJECTIVE STATEMENT: See initial evaluation  PERTINENT HISTORY: She reports she went on a boat at [facility] in June, 2024; a few days later she had an "episode" where "everything hit her"; she describes it as being painful in her back, hips, and legs.  She reports there was no falls or any specific injury or incident that happened while on the boat that day. She had a f/u with her PCP after this "episode" for her annual wellness visit and mentioned it; she states she was referred to PT.  She has also been to chiropractor a few visits (not sure of tx) and it was "helpful" (not clear how or what was actually helpful). She feels like she is having trouble walking sometimes since this episode in June.  Overall, ISQ- not worsening or improving. She feels like she has difficulty walking up/down steps and walking  around her house.   Aggravating factors: lying supine, prolonged standing and walking- bother her back and hip pain.   Alleviating factors: "resting"   No specific localized area of pain; describes generalized low back, L hip (has h/o AVN and is waiting for hip replacement), and leg pain.  She does not use any assistive device for ambulation.    PMH: Avn L hip, medial L knee replacement, 2 arthroscopic surgeries on R knee, R THA; no lumbar spine surgeries; no major ankle surgeries  PAIN:  Are you having pain? No   PRECAUTIONS: Fall   WEIGHT BEARING RESTRICTIONS: No  FALLS:  Has patient fallen in last 6 months? No  PATIENT GOALS: "to get her mobility back"; to move around better without being limited by her back, hip pain  NEXT MD VISIT: unknown  TODAY'S TREATMENT 03/27/23 Subjective: Patient reports she continues to work on the stairs  at home.  She went to [employer] after PT last time- used a cart bc she had to go all over the store.    Pain: 4-5/10  Objective:  Therapeutic Exercises: (to promote lumbopelvic/hip ROM and strength) Nustep: seat #12, level 3 x 10 min- for hip/knee AROM and strength (hx taking during this), 4x 30 second intervals at level 4, for LE strengthening  -front step ups with min UE support- lead with R LE x15  -stair ascent/descent 2 laps with b/l rail- not today -standing hip abd: 2x12 R and L -standing hip extension: 2x10 R and L -airex: tandem: 3x 3 seconds -airex: feet together: 30 seconds x 3 -SLS in parallel bars: 4-5 second attempts R and L x10 ea- not today -Seated hip add with PT manual assistance: 2x10- ball today -Supine hip abd with PT manual resistance: 2x10- black band today -STS x15 (airex under hips) -seated lumbar flexion with blue physioball: x10, focus on x2 diaphragmatic breathing rounds in each stretch -Hooklying LTR: x10 ea direction, 2 sets (pt is very hypomobile today, improved with repetition)- not today -LAQ 3# ankle weights  bilaterally 2x15 -Seated marches 3#: 2x15 -obstacle course: figure 8s and forward/lateral step over cones to work on SLS proprioception and single leg CKC hip mm and knee mm dynamic strength- 5 min in hallway- not today   Manual Therapy: (for L hip/low back) not today Manual gentle long axis traction L LE for pain control as pt notes this is helpful: 10 second bouts, repeated x 5 min in various positions Gentle PROM hip flexion, abd, add within available ROM; long axis IR/ER x10 ea, 2 rounds (+) painful MTP's R lumbar paraspinals- performed STM R>L lumbar spine paraspinal mm x 5 min at end of session (not today)   PATIENT EDUCATION:  Education details: HEP instruction, exercise technique/form  HOME EXERCISE PROGRAM: Access Code: [email or web address] URL: [email or web address] Date: 01/28/2023 Prepared by: Max Fickle  Exercises - Supine Lower Trunk Rotation with PLB  - 1 x daily - 7 x weekly - 2 sets - 10 reps - Supine Hip Adduction Isometric with Ball  - 1 x daily - 7 x weekly - 2 sets - 10 reps - Sit to Stand  - 1 x daily - 7 x weekly - 2 sets - 5 reps  ASSESSMENT:  CLINICAL IMPRESSION:  Pt was challenged with standing therapeutic exercises today; verbal cues needed for proper technique and upright posture during standing hip strengthening.  Pt reports no increase in hip or knee pain at end of tx.  Patient's condition has the potential to improve in response to therapy. Maximum improvement is yet to be obtained. The anticipated improvement is attainable and reasonable in a generally predictable time. Patient will benefit from skilled PT to maximize return to PLOF.   OBJECTIVE IMPAIRMENTS: Abnormal gait, cardiopulmonary status limiting activity, decreased activity tolerance, decreased balance, decreased endurance, decreased mobility, difficulty walking, decreased ROM, decreased strength, and pain.   ACTIVITY LIMITATIONS: carrying, lifting, bending, squatting, sleeping, stairs,  continence, and locomotion level  PARTICIPATION LIMITATIONS: meal prep, cleaning, laundry, and community activity  PERSONAL FACTORS: Age, Behavior pattern, Past/current experiences, Time since onset of injury/illness/exacerbation, and 3+ comorbidities: cardiac, autoimmune, DM, R THA, L hip AVN, R knee OA, lymphedema, see PMH above  are also affecting patient's functional outcome.   REHAB POTENTIAL: Good  CLINICAL DECISION MAKING: Evolving/moderate complexity  EVALUATION COMPLEXITY: Moderate   GOALS: Goals reviewed with patient? Yes  SHORT TERM GOALS: Target date: 01/24/23 Pt will  be able to perform HEP for LE strengthening 3x/week Baseline: to be initiated at future visit; 9/16: patient reports doing a little of her HEP each day "depending on what hurts"  Goal status: MET    LONG TERM GOALS: Target date: 04/08/23  Improve FOTO to predicted DC level indicating improved ability to perform her daily activities without being limited by chronic pain Baseline: to be administered at visit #2; 9/16: 43 Goal status: ONGOING   2.  Pt will be able to participate in 15 minutes therapeutic exercise for LE strength and balance at RPE level 4-6 without a seated rest break to improve her overall ability to perform her daily activities at home that require standing (kitchen/meal prep/chores)without being limited by chronic pain Baseline: 3-5 minutes; 9/16: ~10 minutes Goal status: ONGOING  3.  Improve 5x STS to <20 seconds indicating improved LE strength and reduced fall risk Baseline: 31(with use of UE support); 9/16; 35.3 seconds (with hands on knees) Goal status: ONGOING  4. Patient will increase distance by 200' for progression to community ambulator and improve gait ability  Baseline: 9/16: 735'   Goal status: INITIAL   PLAN:  PT FREQUENCY: 2x/week  PT DURATION: 6 weeks  PLANNED INTERVENTIONS: Therapeutic exercises, Therapeutic activity, Neuromuscular re-education, Balance  training, Gait training, Patient/Family education, Self Care, and Joint mobilization  PLAN FOR NEXT SESSION: functional LE strength/balance/gait training.  Emphasize R quadriceps strengthening to stair navigation.   Ardine Bjork, PT, DPT 03/27/2023, 12:43 PM   12:43 PM, 03/27/23

## 2023-04-01 ENCOUNTER — Ambulatory Visit: Payer: Medicare Other

## 2023-04-01 DIAGNOSIS — M6281 Muscle weakness (generalized): Secondary | ICD-10-CM

## 2023-04-01 DIAGNOSIS — R262 Difficulty in walking, not elsewhere classified: Secondary | ICD-10-CM

## 2023-04-01 NOTE — Therapy (Signed)
OUTPATIENT PHYSICAL THERAPY LOWER TREATMENT   Patient Name: [name] MRN: [id number] DOB:[birth year], 77 y.o., female Today's Date: 04/01/2023  END OF SESSION:  PT End of Session - 04/01/23 1434     Visit Number 16    Number of Visits 20    Date for PT Re-Evaluation 04/08/23    Authorization Type Medicare; 2x/week x 4-6 weeks (04/08/23)    Progress Note Due on Visit 10    PT Start Time 1418    PT Stop Time 1500    PT Time Calculation (min) 42 min    Activity Tolerance Patient tolerated treatment well    Behavior During Therapy WFL for tasks assessed/performed               Past Medical History:  Diagnosis Date   Anterolisthesis    Cervical spine   Aortic arch aneurysm (HCC)    Asthma    Connective tissue disorder (HCC)    recurrent carotid arteritis, temporal arteritis, vasculitis mandible, general hepatitis, avascular necrosis bilat   DDD (degenerative disc disease), lumbar    Diabetes mellitus without complication (HCC)    Diverticulosis    Duodenitis    Ganglion cyst of right foot    Gouty arthritis    Hiatal hernia with GERD    History of cardiovascular stress test    a. 07/2018 [facility]): Fixed inferoapical defect w/ nl contraction-->attenuation artifact. No ischemia. EF 63%.   Hyperlipidemia    Hypertension    a. 02/2019 Renal artery duplex: no evidence of prox RAS.   Hyperuricemia    Keratitis sicca, bilateral    Lymphedema of both lower extremities    Osteoarthritis    Pericarditis    Recurrent: Aug 97, July 05, Sept 08, July 16, July 18   PUD (peptic ulcer disease)    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HCC)    Sjogren's syndrome (HCC)    Syncope    Tendonitis, Achilles, right    Tortuous colon    Trigger finger    Past Surgical History:  Procedure Laterality Date   ABDOMINAL HYSTERECTOMY     BREAST BIOPSY     BREAST EXCISIONAL BIOPSY Left 1986   neg   CHOLECYSTECTOMY     COLONOSCOPY WITH PROPOFOL N/A 08/26/2018   Procedure: COLONOSCOPY  WITH PROPOFOL;  Surgeon: Midge Minium, MD;  Location: [facility] ENDOSCOPY;  Service: Endoscopy;  Laterality: N/A;   CYSTOSCOPY  02/26/2018   Anola Gurney, MD    distal arthrectomy     ESOPHAGOGASTRODUODENOSCOPY (EGD) WITH PROPOFOL N/A 08/26/2018   Procedure: ESOPHAGOGASTRODUODENOSCOPY (EGD) WITH PROPOFOL;  Surgeon: Midge Minium, MD;  Location: [facility] ENDOSCOPY;  Service: Endoscopy;  Laterality: N/A;   EXCISION NEUROMA     KNEE SURGERY Bilateral    1985, 2001, 11/2002, 12/2006   panhysterectomy  10/1983   SHOULDER SURGERY Right    reverse total arthroplasty w/ biceps tenodesis   TONSILLECTOMY AND ADENOIDECTOMY     TOTAL HIP ARTHROPLASTY Right 04/2007   WISDOM TOOTH EXTRACTION     Patient Active Problem List   Diagnosis Date Noted   Acute UTI 03/18/2023   Abdominal pain 07/05/2021   H/O total hip arthroplasty, right 07/12/2020   Status post left partial knee replacement 07/12/2020   Nausea 03/31/2020   Multiple thyroid nodules 12/28/2019   Thyroid nodule 09/29/2019   Lymphedema 11/10/2018   PAD (peripheral artery disease) (HCC) 10/12/2018   Aortic atherosclerosis (HCC) 10/12/2018   Carotid stenosis, asymptomatic, bilateral 10/12/2018   Positive colorectal cancer screening using  Cologuard test 10/09/2018   Gastroesophageal reflux disease    Acute gastritis without hemorrhage    Osteoarthritis 04/29/2018   Hiatal hernia with GERD 04/29/2018   Hyperlipidemia 04/29/2018   Lymphedema of both lower extremities 04/29/2018   Left knee pain 10/23/2016   Cataracts, bilateral 07/31/2016   Glaucoma 07/31/2016   Lateral epicondylitis of left elbow 10/20/2015   Vitamin D deficiency 10/20/2015   Type 2 diabetes, controlled, with neuropathy (HCC) 10/16/2015   Type II diabetes mellitus with complication (HCC) 10/16/2015   Gouty arthritis 06/03/2015   H/O syncope 06/03/2015   Mild intermittent asthma 06/03/2015   Multiple gastric ulcers 06/03/2015   Schatzki's ring 06/03/2015   Undifferentiated  connective tissue disease (HCC) 06/03/2015   Bone disorder 04/05/2015   Dental injury 04/05/2015   Pericarditis 04/05/2015   Sjogren's syndrome (HCC) 09/07/2014   Elevated alkaline phosphatase level 08/24/2014   Lactose intolerance 08/24/2014   Obesity 08/24/2014   Peripheral edema 08/24/2014   Benign 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 Judithann Sheen  REFERRING PROVIDER: Dr. Judithann Sheen  REFERRING DIAG: Ataxia  THERAPY DIAG:  Muscle weakness (generalized)  Difficulty in walking, not elsewhere classified  Rationale for Evaluation and Treatment: Rehabilitation  ONSET DATE: June 2024  SUBJECTIVE: From initial evaluation  SUBJECTIVE STATEMENT: See initial evaluation  PERTINENT HISTORY: She reports she went on a boat at [facility] in June, 2024; a few days later she had an "episode" where "everything hit her"; she describes it as being painful in her back, hips, and legs.  She reports there was no falls or any specific injury or incident that happened while on the boat that day. She had a f/u with her PCP after this "episode" for her annual wellness visit and mentioned it; she states she was referred to PT.  She has also been to chiropractor a few visits (not sure of tx) and it was "helpful" (not clear how or what was actually helpful). She feels like she is having trouble walking sometimes since this episode in June.  Overall, ISQ- not worsening or improving. She feels like she has difficulty walking up/down steps and walking  around her house.   Aggravating factors: lying supine, prolonged standing and walking- bother her back and hip pain.   Alleviating factors: "resting"   No specific localized area of pain; describes generalized low back, L hip (has h/o AVN and is waiting for hip replacement), and leg pain.  She does not use any assistive device for ambulation.    PMH: Avn L hip, medial L knee replacement, 2 arthroscopic surgeries on R knee, R THA; no lumbar spine surgeries; no major ankle surgeries  PAIN:  Are you having pain? No   PRECAUTIONS: Fall   WEIGHT BEARING RESTRICTIONS: No  FALLS:  Has patient fallen in last 6 months? No  PATIENT GOALS: "to get her mobility back"; to move around better without being limited by her back, hip pain  NEXT MD VISIT: unknown  TODAY'S TREATMENT 04/01/23 Subjective: Patient reports her hip and knee are "doing ok";  her R knee is feeling good when she focuses on her foot aligned forward.  Her L hip is not hurting enough to consider any surgery now.  Pain: 4-5/10  Objective:  Therapeutic Exercises: (to promote lumbopelvic/hip ROM and strength) Nustep: seat #12, level 3 x 10 min- for hip/knee AROM and strength (goal >65 SPM throughout for endurance) -front step ups with min UE support- lead with R LE x15- not today -stair ascent/descent 2 laps with b/l rail- not today -standing hip abd: 2x12 R and L -standing hip extension: 2x10 R and L -airex: tandem: 3x 3 seconds -airex: feet together: 30 seconds x 3 -SLS in parallel bars: 4-5 second attempts R and L x10 ea- not today -Seated hip add with PT manual assistance: 2x10- ball today -Supine hip abd with PT manual resistance: 2x10- black band today -STS x15 (airex under hips) -seated lumbar flexion with blue physioball: x10, focus on x2 diaphragmatic breathing rounds in each stretch -Hooklying LTR: x10 ea direction, 2 sets (pt is very hypomobile today, improved with repetition)- not today -LAQ 3# ankle weights  bilaterally 2x15 -Seated marches 3#: 2x15 -obstacle course: figure 8s and forward/lateral step over cones to work on SLS proprioception and single leg CKC hip mm and knee mm dynamic strength- 5 min in hallway- not today   Manual Therapy: (for L hip/low back) not today Manual gentle long axis traction L LE for pain control as pt notes this is helpful: 10 second bouts, repeated x 5 min in various positions Gentle PROM hip flexion, abd, add within available ROM; long axis IR/ER x10 ea, 2 rounds (+) painful MTP's R lumbar paraspinals- performed STM R>L lumbar spine paraspinal mm x 5 min at end of session (not today)   PATIENT EDUCATION:  Education details: HEP instruction, exercise technique/form  HOME EXERCISE PROGRAM: Access Code: [email or web address] URL: [email or web address] Date: 01/28/2023 Prepared by: Max Fickle  Exercises - Supine Lower Trunk Rotation with PLB  - 1 x daily - 7 x weekly - 2 sets - 10 reps - Supine Hip Adduction Isometric with Ball  - 1 x daily - 7 x weekly - 2 sets - 10 reps - Sit to Stand  - 1 x daily - 7 x weekly - 2 sets - 5 reps  ASSESSMENT:  CLINICAL IMPRESSION:  Pt required frequent cues to stay on task during therapeutic exercises resulting in fewer number of exercises performed during session today.  Pt overall was challenged with LE strengthening exercises.  Patient's condition has the potential to improve in response to therapy. Maximum improvement is yet to be obtained. The anticipated improvement is attainable and reasonable in a generally predictable time. Patient will benefit from skilled PT to maximize return to PLOF.   OBJECTIVE IMPAIRMENTS: Abnormal gait, cardiopulmonary status limiting activity, decreased activity tolerance, decreased balance, decreased endurance, decreased mobility, difficulty walking, decreased ROM, decreased strength, and pain.   ACTIVITY LIMITATIONS: carrying, lifting, bending, squatting, sleeping, stairs, continence,  and locomotion level  PARTICIPATION LIMITATIONS: meal prep, cleaning, laundry, and community activity  PERSONAL FACTORS: Age, Behavior pattern, Past/current experiences, Time since onset of injury/illness/exacerbation, and 3+ comorbidities: cardiac, autoimmune, DM, R THA, L hip AVN, R knee OA, lymphedema, see PMH above  are also affecting patient's functional outcome.   REHAB POTENTIAL: Good  CLINICAL DECISION MAKING: Evolving/moderate complexity  EVALUATION COMPLEXITY: Moderate   GOALS: Goals reviewed with patient? Yes  SHORT TERM GOALS: Target date: 01/24/23 Pt will be able to perform HEP for LE strengthening 3x/week Baseline: to  be initiated at future visit; 9/16: patient reports doing a little of her HEP each day "depending on what hurts"  Goal status: MET    LONG TERM GOALS: Target date: 04/08/23  Improve FOTO to predicted DC level indicating improved ability to perform her daily activities without being limited by chronic pain Baseline: to be administered at visit #2; 9/16: 43 Goal status: ONGOING   2.  Pt will be able to participate in 15 minutes therapeutic exercise for LE strength and balance at RPE level 4-6 without a seated rest break to improve her overall ability to perform her daily activities at home that require standing (kitchen/meal prep/chores)without being limited by chronic pain Baseline: 3-5 minutes; 9/16: ~10 minutes Goal status: ONGOING  3.  Improve 5x STS to <20 seconds indicating improved LE strength and reduced fall risk Baseline: 31(with use of UE support); 9/16; 35.3 seconds (with hands on knees) Goal status: ONGOING  4. Patient will increase distance by 200' for progression to community ambulator and improve gait ability  Baseline: 9/16: 735'   Goal status: INITIAL   PLAN:  PT FREQUENCY: 2x/week  PT DURATION: 6 weeks  PLANNED INTERVENTIONS: Therapeutic exercises, Therapeutic activity, Neuromuscular re-education, Balance training, Gait  training, Patient/Family education, Self Care, and Joint mobilization  PLAN FOR NEXT SESSION: functional LE strength/balance/gait training.  Emphasize R quadriceps strengthening to stair navigation.   Ardine Bjork, PT, DPT 04/01/2023, 3:06 PM   3:06 PM, 04/01/23

## 2023-04-03 ENCOUNTER — Ambulatory Visit: Payer: Medicare Other

## 2023-04-03 DIAGNOSIS — M6281 Muscle weakness (generalized): Secondary | ICD-10-CM | POA: Diagnosis not present

## 2023-04-03 DIAGNOSIS — G894 Chronic pain syndrome: Secondary | ICD-10-CM

## 2023-04-03 DIAGNOSIS — R262 Difficulty in walking, not elsewhere classified: Secondary | ICD-10-CM

## 2023-04-03 NOTE — Therapy (Signed)
OUTPATIENT PHYSICAL THERAPY LOWER TREATMENT   Patient Name: [name] MRN: [id number] DOB:04/14/1946, 77 y.o., female Today's Date: 04/03/2023  END OF SESSION:  PT End of Session - 04/03/23 1258     Visit Number 17    Number of Visits 20    Date for PT Re-Evaluation 04/08/23    Authorization Type Medicare; 2x/week x 4-6 weeks (04/08/23)    Progress Note Due on Visit 10    PT Start Time 1245    PT Stop Time 1330    PT Time Calculation (min) 45 min    Activity Tolerance Patient tolerated treatment well    Behavior During Therapy WFL for tasks assessed/performed               Past Medical History:  Diagnosis Date   Anterolisthesis    Cervical spine   Aortic arch aneurysm (HCC)    Asthma    Connective tissue disorder (HCC)    recurrent carotid arteritis, temporal arteritis, vasculitis mandible, general hepatitis, avascular necrosis bilat   DDD (degenerative disc disease), lumbar    Diabetes mellitus without complication (HCC)    Diverticulosis    Duodenitis    Ganglion cyst of right foot    Gouty arthritis    Hiatal hernia with GERD    History of cardiovascular stress test    a. 07/2018 [facility]): Fixed inferoapical defect w/ nl contraction-->attenuation artifact. No ischemia. EF 63%.   Hyperlipidemia    Hypertension    a. 02/2019 Renal artery duplex: no evidence of prox RAS.   Hyperuricemia    Keratitis sicca, bilateral    Lymphedema of both lower extremities    Osteoarthritis    Pericarditis    Recurrent: Aug 97, July 05, Sept 08, July 16, July 18   PUD (peptic ulcer disease)    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HCC)    Sjogren's syndrome (HCC)    Syncope    Tendonitis, Achilles, right    Tortuous colon    Trigger finger    Past Surgical History:  Procedure Laterality Date   ABDOMINAL HYSTERECTOMY     BREAST BIOPSY     BREAST EXCISIONAL BIOPSY Left 1986   neg   CHOLECYSTECTOMY     COLONOSCOPY WITH PROPOFOL N/A 08/26/2018   Procedure: COLONOSCOPY  WITH PROPOFOL;  Surgeon: Midge Minium, MD;  Location: [facility] ENDOSCOPY;  Service: Endoscopy;  Laterality: N/A;   CYSTOSCOPY  02/26/2018   Anola Gurney, MD    distal arthrectomy     ESOPHAGOGASTRODUODENOSCOPY (EGD) WITH PROPOFOL N/A 08/26/2018   Procedure: ESOPHAGOGASTRODUODENOSCOPY (EGD) WITH PROPOFOL;  Surgeon: Midge Minium, MD;  Location: [facility] ENDOSCOPY;  Service: Endoscopy;  Laterality: N/A;   EXCISION NEUROMA     KNEE SURGERY Bilateral    1985, 2001, 11/2002, 12/2006   panhysterectomy  10/1983   SHOULDER SURGERY Right    reverse total arthroplasty w/ biceps tenodesis   TONSILLECTOMY AND ADENOIDECTOMY     TOTAL HIP ARTHROPLASTY Right 04/2007   WISDOM TOOTH EXTRACTION     Patient Active Problem List   Diagnosis Date Noted   Acute UTI 03/18/2023   Abdominal pain 07/05/2021   H/O total hip arthroplasty, right 07/12/2020   Status post left partial knee replacement 07/12/2020   Nausea 03/31/2020   Multiple thyroid nodules 12/28/2019   Thyroid nodule 09/29/2019   Lymphedema 11/10/2018   PAD (peripheral artery disease) (HCC) 10/12/2018   Aortic atherosclerosis (HCC) 10/12/2018   Carotid stenosis, asymptomatic, bilateral 10/12/2018   Positive colorectal cancer screening using  Cologuard test 10/09/2018   Gastroesophageal reflux disease    Acute gastritis without hemorrhage    Osteoarthritis 04/29/2018   Hiatal hernia with GERD 04/29/2018   Hyperlipidemia 04/29/2018   Lymphedema of both lower extremities 04/29/2018   Left knee pain 10/23/2016   Cataracts, bilateral 07/31/2016   Glaucoma 07/31/2016   Lateral epicondylitis of left elbow 10/20/2015   Vitamin D deficiency 10/20/2015   Type 2 diabetes, controlled, with neuropathy (HCC) 10/16/2015   Type II diabetes mellitus with complication (HCC) 10/16/2015   Gouty arthritis 06/03/2015   H/O syncope 06/03/2015   Mild intermittent asthma 06/03/2015   Multiple gastric ulcers 06/03/2015   Schatzki's ring 06/03/2015   Undifferentiated  connective tissue disease (HCC) 06/03/2015   Bone disorder 04/05/2015   Dental injury 04/05/2015   Pericarditis 04/05/2015   Sjogren's syndrome (HCC) 09/07/2014   Elevated alkaline phosphatase level 08/24/2014   Lactose intolerance 08/24/2014   Obesity 08/24/2014   Peripheral edema 08/24/2014   Benign 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 Judithann Sheen  REFERRING PROVIDER: Dr. Judithann Sheen  REFERRING DIAG: Ataxia  THERAPY DIAG:  Muscle weakness (generalized)  Difficulty in walking, not elsewhere classified  Chronic pain syndrome  Rationale for Evaluation and Treatment: Rehabilitation  ONSET DATE: June 2024  SUBJECTIVE: From initial evaluation  SUBJECTIVE STATEMENT: See initial evaluation  PERTINENT HISTORY: She reports she went on a boat at [facility] in June, 2024; a few days later she had an "episode" where "everything hit her"; she describes it as being painful in her back, hips, and legs.  She reports there was no falls or any specific injury or incident that happened while on the boat that day. She had a f/u with her PCP after this "episode" for her annual wellness visit and mentioned it; she states she was referred to PT.  She has also been to chiropractor a few visits (not sure of tx) and it was "helpful" (not clear how or what was actually helpful). She feels like she is having trouble walking sometimes since this episode in June.  Overall, ISQ- not worsening or improving. She feels like she has difficulty walking  up/down steps and walking around her house.   Aggravating factors: lying supine, prolonged standing and walking- bother her back and hip pain.   Alleviating factors: "resting"   No specific localized area of pain; describes generalized low back, L hip (has h/o AVN and is waiting for hip replacement), and leg pain.  She does not use any assistive device for ambulation.    PMH: Avn L hip, medial L knee replacement, 2 arthroscopic surgeries on R knee, R THA; no lumbar spine surgeries; no major ankle surgeries  PAIN:  Are you having pain? No   PRECAUTIONS: Fall   WEIGHT BEARING RESTRICTIONS: No  FALLS:  Has patient fallen in last 6 months? No  PATIENT GOALS: "to get her mobility back"; to move around better without being limited by her back, hip pain  NEXT MD VISIT: unknown  TODAY'S TREATMENT 04/03/23 Subjective: Patient reports she is feeling  good upon arrival.  She continues to use an Art gallery manager cart when shopping at [employer].  Holds a shopping cart at grocery store.  She is careful going up and down steps.     Pain: 4-5/10  Objective:  Therapeutic Exercises: (to promote lumbopelvic/hip ROM and strength) Nustep: seat #12, level 3 x 10 min- for hip/knee AROM and strength (goal >65 SPM throughout for endurance) -front step ups with min UE support- lead with R LE x15 -stair ascent/descent 2 laps with b/l rail- focused on awareness of foot placement on step and using LE strength to push up or control descent.  Utilized R step to gait pattern with descent bc of R knee pain. -standing hip abd: 2x12 R and L -standing hip extension: 2x10 R and L -airex: tandem: 3x 3 seconds- not today -airex: feet together: 30 seconds x 3- not today -SLS in parallel bars: 4-5 second attempts R and L x10 ea- not today -Seated hip add with PT manual assistance: 2x10- ball today -Supine hip abd with PT manual resistance: 2x10- black band today -STS x15 (airex under hips) -seated lumbar flexion  with blue physioball: x10, focus on x2 diaphragmatic breathing rounds in each stretch -Hooklying LTR: x10 ea direction, 2 sets (pt is very hypomobile today, improved with repetition)- not today -LAQ 3# ankle weights bilaterally 2x15 -Seated marches 3#: 2x15 -obstacle course: figure 8s and forward/lateral step over cones to work on SLS proprioception and single leg CKC hip mm and knee mm dynamic strength- 5 min in hallway- not today -amb on even/uneven/ramped sidewalk surfaces with PT for endurance; 1 lap around building.  PT required 2 brief standing rest breaks.  No major loss of balance noted.   Manual Therapy: (for L hip/low back) not today Manual gentle long axis traction L LE for pain control as pt notes this is helpful: 10 second bouts, repeated x 5 min in various positions Gentle PROM hip flexion, abd, add within available ROM; long axis IR/ER x10 ea, 2 rounds (+) painful MTP's R lumbar paraspinals- performed STM R>L lumbar spine paraspinal mm x 5 min at end of session (not today)   PATIENT EDUCATION:  Education details: HEP instruction, exercise technique/form  HOME EXERCISE PROGRAM: Access Code: [email or web address] URL: [email or web address] Date: 01/28/2023 Prepared by: Max Fickle  Exercises - Supine Lower Trunk Rotation with PLB  - 1 x daily - 7 x weekly - 2 sets - 10 reps - Supine Hip Adduction Isometric with Ball  - 1 x daily - 7 x weekly - 2 sets - 10 reps - Sit to Stand  - 1 x daily - 7 x weekly - 2 sets - 5 reps  ASSESSMENT:  CLINICAL IMPRESSION:  Pt demonstrates improved control on ascent and descent of stairs today.  Utilizing b/l railing is indicated for safety.  Worked walking/standing endurance and navigating uneven surfaces safely with ambulating 1 lap around building.  Pt able to complete the lap with PT supervision, and 2 brief standing rest breaks.  Patient's condition has the potential to improve in response to therapy. Maximum improvement is yet to be  obtained. The anticipated improvement is attainable and reasonable in a generally predictable time. Patient will benefit from skilled PT to maximize return to PLOF.   OBJECTIVE IMPAIRMENTS: Abnormal gait, cardiopulmonary status limiting activity, decreased activity tolerance, decreased balance, decreased endurance, decreased mobility, difficulty walking, decreased ROM, decreased strength, and pain.   ACTIVITY LIMITATIONS: carrying, lifting, bending, squatting, sleeping, stairs, continence, and locomotion level  PARTICIPATION  LIMITATIONS: meal prep, cleaning, laundry, and community activity  PERSONAL FACTORS: Age, Behavior pattern, Past/current experiences, Time since onset of injury/illness/exacerbation, and 3+ comorbidities: cardiac, autoimmune, DM, R THA, L hip AVN, R knee OA, lymphedema, see PMH above  are also affecting patient's functional outcome.   REHAB POTENTIAL: Good  CLINICAL DECISION MAKING: Evolving/moderate complexity  EVALUATION COMPLEXITY: Moderate   GOALS: Goals reviewed with patient? Yes  SHORT TERM GOALS: Target date: 01/24/23 Pt will be able to perform HEP for LE strengthening 3x/week Baseline: to be initiated at future visit; 9/16: patient reports doing a little of her HEP each day "depending on what hurts"  Goal status: MET    LONG TERM GOALS: Target date: 04/08/23  Improve FOTO to predicted DC level indicating improved ability to perform her daily activities without being limited by chronic pain Baseline: to be administered at visit #2; 9/16: 43 Goal status: ONGOING   2.  Pt will be able to participate in 15 minutes therapeutic exercise for LE strength and balance at RPE level 4-6 without a seated rest break to improve her overall ability to perform her daily activities at home that require standing (kitchen/meal prep/chores)without being limited by chronic pain Baseline: 3-5 minutes; 9/16: ~10 minutes Goal status: ONGOING  3.  Improve 5x STS to <20 seconds  indicating improved LE strength and reduced fall risk Baseline: 31(with use of UE support); 9/16; 35.3 seconds (with hands on knees) Goal status: ONGOING  4. Patient will increase distance by 200' for progression to community ambulator and improve gait ability  Baseline: 9/16: 735'   Goal status: INITIAL   PLAN:  PT FREQUENCY: 2x/week  PT DURATION: 6 weeks  PLANNED INTERVENTIONS: Therapeutic exercises, Therapeutic activity, Neuromuscular re-education, Balance training, Gait training, Patient/Family education, Self Care, and Joint mobilization  PLAN FOR NEXT SESSION: functional LE strength/balance/gait training.  Emphasize R quadriceps strengthening to stair navigation.   Ardine Bjork, PT, DPT 04/03/2023, 1:54 PM   1:54 PM, 04/03/23

## 2023-04-08 ENCOUNTER — Ambulatory Visit: Payer: Medicare Other

## 2023-04-09 ENCOUNTER — Encounter: Payer: Self-pay | Admitting: Emergency Medicine

## 2023-04-09 ENCOUNTER — Ambulatory Visit
Admission: EM | Admit: 2023-04-09 | Discharge: 2023-04-09 | Disposition: A | Discharge status: Home / Self Care | Payer: Medicare Other | Attending: Emergency Medicine | Admitting: Emergency Medicine

## 2023-04-09 DIAGNOSIS — N390 Urinary tract infection, site not specified: Secondary | ICD-10-CM

## 2023-04-09 DIAGNOSIS — B3731 Acute candidiasis of vulva and vagina: Secondary | ICD-10-CM | POA: Diagnosis present

## 2023-04-09 LAB — URINALYSIS, W/ REFLEX TO CULTURE (INFECTION SUSPECTED)
Bilirubin Urine: NEGATIVE
Glucose, UA: NEGATIVE mg/dL
Hgb urine dipstick: NEGATIVE
Nitrite: NEGATIVE
Protein, ur: NEGATIVE mg/dL
Specific Gravity, Urine: 1.025 (ref 1.005–1.030)
WBC, UA: >50 WBC/hpf (ref 0–5)
pH: 5.5 (ref 5.0–8.0)

## 2023-04-09 MED ORDER — FLUCONAZOLE 150 MG PO TABS
150.0000 mg | ORAL_TABLET | ORAL | 0 refills | Status: AC
Start: 2023-04-09 — End: 2023-04-16

## 2023-04-09 MED ORDER — FOSFOMYCIN TROMETHAMINE 3 G PO PACK: PACK | ORAL | 0 refills | Status: AC

## 2023-04-09 NOTE — ED Triage Notes (Signed)
Pt presents with right side flank pain x 5 days. Pt denies any dysuria or urinary frequency.

## 2023-04-09 NOTE — ED Provider Notes (Signed)
[facility]    CSN: [id number] Arrival date & time: 04/09/23  1837      History   Chief Complaint Chief Complaint  Patient presents with   Flank Pain    HPI [name] is a 77 y.o. female.   HPI  77 year old female with a past medical history significant for hyperlipidemia, type 2 diabetes, asthma, peptic ulcer disease, diverticulosis, and Sjogren syndrome presents for evaluation of right flank pain for the last 5 days.  No associated dysuria or urinary frequency.  She reports that the pain does wrap around to the front.  She no longer has her gallbladder.  Past Medical History:  Diagnosis Date   Anterolisthesis    Cervical spine   Aortic arch aneurysm (HCC)    Asthma    Connective tissue disorder (HCC)    recurrent carotid arteritis, temporal arteritis, vasculitis mandible, general hepatitis, avascular necrosis bilat   DDD (degenerative disc disease), lumbar    Diabetes mellitus without complication (HCC)    Diverticulosis    Duodenitis    Ganglion cyst of right foot    Gouty arthritis    Hiatal hernia with GERD    History of cardiovascular stress test    a. 07/2018 [facility]): Fixed inferoapical defect w/ nl contraction-->attenuation artifact. No ischemia. EF 63%.   Hyperlipidemia    Hypertension    a. 02/2019 Renal artery duplex: no evidence of prox RAS.   Hyperuricemia    Keratitis sicca, bilateral    Lymphedema of both lower extremities    Osteoarthritis    Pericarditis    Recurrent: Aug 97, July 05, Sept 08, July 16, July 18   PUD (peptic ulcer disease)    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HCC)    Sjogren's syndrome (HCC)    Syncope    Tendonitis, Achilles, right    Tortuous colon    Trigger finger     Patient Active Problem List   Diagnosis Date Noted   Acute UTI 03/18/2023   Abdominal pain 07/05/2021   H/O total hip arthroplasty, right 07/12/2020   Status post left partial knee replacement 07/12/2020   Nausea 03/31/2020   Multiple  thyroid nodules 12/28/2019   Thyroid nodule 09/29/2019   Lymphedema 11/10/2018   PAD (peripheral artery disease) (HCC) 10/12/2018   Aortic atherosclerosis (HCC) 10/12/2018   Carotid stenosis, asymptomatic, bilateral 10/12/2018   Positive colorectal cancer screening using Cologuard test 10/09/2018   Gastroesophageal reflux disease    Acute gastritis without hemorrhage    Osteoarthritis 04/29/2018   Hiatal hernia with GERD 04/29/2018   Hyperlipidemia 04/29/2018   Lymphedema of both lower extremities 04/29/2018   Left knee pain 10/23/2016   Cataracts, bilateral 07/31/2016   Glaucoma 07/31/2016   Lateral epicondylitis of left elbow 10/20/2015   Vitamin D deficiency 10/20/2015   Type 2 diabetes, controlled, with neuropathy (HCC) 10/16/2015   Type II diabetes mellitus with complication (HCC) 10/16/2015   Gouty arthritis 06/03/2015   H/O syncope 06/03/2015   Mild intermittent asthma 06/03/2015   Multiple gastric ulcers 06/03/2015   Schatzki's ring 06/03/2015   Undifferentiated connective tissue disease (HCC) 06/03/2015   Bone disorder 04/05/2015   Dental injury 04/05/2015   Pericarditis 04/05/2015   Sjogren's syndrome (HCC) 09/07/2014   Elevated alkaline phosphatase level 08/24/2014   Lactose intolerance 08/24/2014   Obesity 08/24/2014   Peripheral edema 08/24/2014   Benign hypertension with CKD (chronic kidney disease) stage III 08/03/2014   Abdominal adhesions 08/02/2014   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HCC) 08/02/2014   Degenerative joint disease (DJD) of lumbar spine 08/02/2014   Diverticulosis of both small and large intestine 08/02/2014   Hyperuricemia 08/02/2014   Pure hypercholesterolemia 08/02/2014   Trigger finger 08/02/2014   Right shoulder pain 07/29/2014   Benign neoplasm of colon 06/30/2010   Right upper quadrant pain 06/30/2010    Past Surgical History:  Procedure Laterality Date   ABDOMINAL HYSTERECTOMY     BREAST BIOPSY     BREAST EXCISIONAL  BIOPSY Left 1986   neg   CHOLECYSTECTOMY     COLONOSCOPY WITH PROPOFOL N/A 08/26/2018   Procedure: COLONOSCOPY WITH PROPOFOL;  Surgeon: Midge Minium, MD;  Location: [facility] ENDOSCOPY;  Service: Endoscopy;  Laterality: N/A;   CYSTOSCOPY  02/26/2018   Anola Gurney, MD    distal arthrectomy     ESOPHAGOGASTRODUODENOSCOPY (EGD) WITH PROPOFOL N/A 08/26/2018   Procedure: ESOPHAGOGASTRODUODENOSCOPY (EGD) WITH PROPOFOL;  Surgeon: Midge Minium, MD;  Location: [facility] ENDOSCOPY;  Service: Endoscopy;  Laterality: N/A;   EXCISION NEUROMA     KNEE SURGERY Bilateral    1985, 2001, 11/2002, 12/2006   panhysterectomy  10/1983   SHOULDER SURGERY Right    reverse total arthroplasty w/ biceps tenodesis   TONSILLECTOMY AND ADENOIDECTOMY     TOTAL HIP ARTHROPLASTY Right 04/2007   WISDOM TOOTH EXTRACTION      OB History   No obstetric history on file.      Home Medications    Prior to Admission medications   Medication Sig Start Date End Date Taking? Authorizing Provider  fluconazole (DIFLUCAN) 150 MG tablet Take 1 tablet (150 mg total) by mouth every 3 (three) days for 3 doses. 04/09/23 04/16/23 Yes Becky Augusta, NP  acetaminophen (TYLENOL) 500 MG tablet Take 500 mg by mouth daily as needed.    [provider]  albuterol (VENTOLIN HFA) 108 (90 Base) MCG/ACT inhaler Inhale 1-2 puffs into the lungs every 4 (four) hours as needed for wheezing or shortness of breath. 06/03/21   Shirlee Latch, PA-C  ARTIFICIAL TEAR OP Apply to eye as needed.     [provider]  BD INSULIN SYRINGE U/F 31G X 5/16" 0.5 ML MISC USE AS DIRECTED Patient not taking: Reported on 09/03/2022 08/30/20   Marguarite Arbour, MD  BD INSULIN SYRINGE U/F 31G X 5/16" 1 ML MISC USE AS DIRECTED. WITH HUMALOG Patient not taking: Reported on 09/03/2022 07/28/18   Galen Manila, NP  BIOTIN PO Take 1 mg by mouth daily.  Patient not taking: Reported on 09/03/2022    [provider]  budesonide-formoterol (SYMBICORT)  80-4.5 MCG/ACT inhaler Inhale 2 puffs into the lungs 2 (two) times daily. Patient not taking: Reported on 09/03/2022 06/09/21   Rodriguez-Southworth, Nettie Elm, PA-C  Cholecalciferol (VITAMIN D3) 25 MCG (1000 UT) CAPS Take 4 capsules by mouth daily.     [provider]  cloNIDine (CATAPRES) 0.1 MG tablet Take 1 tablet (0.1 mg total) by mouth 3 (three) times daily. 09/18/22 12/17/22  Antonieta Iba, MD  COLCRYS 0.6 MG tablet Take 1 tablet (0.6 mg total) by mouth 2 (two) times daily as needed. For up to 3 days for pericarditis, or up to 7 days for gout or connective tissue disorder. 01/14/20   Karamalegos, Netta Neat, DO  cyclobenzaprine (FLEXERIL) 5 MG tablet Take 1 tablet (5 mg total) by mouth daily as needed for muscle spasms. 05/26/20   Malfi, Jodelle Gross, FNP  doxazosin (CARDURA) 1 MG tablet Take 1 tablet by mouth at  bedtime. 10/26/22 10/26/23  [provider]  ezetimibe (ZETIA) 10 MG tablet TAKE 1 TABLET BY MOUTH DAILY Patient not taking: Reported on 09/03/2022 02/26/22   Antonieta Iba, MD  famotidine (PEPCID) 40 MG tablet Take 40 mg by mouth 2 (two) times daily.    [provider]  fosfomycin (MONUROL) 3 g PACK Take 1 pack every 48 hours x 3 doses 04/09/23   Becky Augusta, NP  FREESTYLE LITE test strip USE AS DIRECTED THREE TIMES DAILY FOR DIABETES 03/03/20   Malfi, Jodelle Gross, FNP  insulin degludec (TRESIBA FLEXTOUCH) 100 UNIT/ML FlexTouch Pen Inject 72 Units into the skin daily.    [provider]  insulin lispro (HUMALOG) 100 UNIT/ML injection Correction scale: take 1 unit per 50 over 150 before meals up to three times daily.  Up to 20 units per day. 02/12/19   Galen Manila, NP  Insulin Pen Needle (PEN NEEDLES) 32G X 5 MM MISC 1 Device by Does not apply route daily. Patient not taking: Reported on 09/03/2022 08/26/19   Tarri Fuller, FNP  lactase (LACTAID) 3000 units tablet Take by mouth as needed.     [provider]  Lancets (FREESTYLE) lancets Must  test x4/day Patient not taking: Reported on 09/03/2022 06/19/16   [provider]  nebivolol (BYSTOLIC) 10 MG tablet Take 1 tablet (10 mg total) by mouth 2 (two) times daily. 12/26/22   Cyndi Bender, NP  ondansetron (ZOFRAN-ODT) 4 MG disintegrating tablet Take 1 tablet (4 mg total) by mouth every 8 (eight) hours as needed for nausea or vomiting. Patient not taking: Reported on 09/03/2022 03/29/20   Tarri Fuller, FNP  phenazopyridine (PYRIDIUM) 200 MG tablet Take 1 tablet (200 mg total) by mouth 3 (three) times daily. Patient not taking: Reported on 09/03/2022 03/27/22   Becky Augusta, NP  rosuvastatin (CRESTOR) 10 MG tablet Take 1 tablet (10 mg total) by mouth daily. 09/03/22   Antonieta Iba, MD  traMADol (ULTRAM) 50 MG tablet Take 50 mg by mouth every 8 (eight) hours as needed.    [provider]    Family History Family History  Problem Relation Age of Onset   Hypertension Mother    Diabetes Mother    Hyperlipidemia Mother    Congestive Heart Failure Mother    Cancer Mother    Non-Hodgkin's lymphoma Sister    Cancer Sister    Diabetes Maternal Grandmother    Breast cancer Neg Hx     Social History Social History   Tobacco Use   Smoking status: Former    Current packs/day: 0.00    Types: Cigarettes    Quit date: 01/22/1996    Years since quitting: 27.2   Smokeless tobacco: Never  Vaping Use   Vaping status: Never Used  Substance Use Topics   Alcohol use: Yes    Comment: occasional-once every 6 months   Drug use: Never     Allergies   Amlodipine, Aspirin, Bee venom, Ciprofloxacin, Codeine, Egg-derived products, Erythromycin, Glimepiride, Hydralazine, Hydrochlorothiazide, Hydrocodone, Hydrocodone-acetaminophen, Hydromorphone, Irbesartan, Irbesartan-hydrochlorothiazide, Lisinopril, Maxitrol [neomycin-polymyxin-dexameth], Metformin and related, Metformin hcl, Methylcellulose, Metoclopramide, Metoprolol, Minoxidil, Neomycin-bacitracin zn-polymyx, Norvasc  [amlodipine besylate], Nsaids, Omeprazole magnesium, Other, Oxycodone-acetaminophen, Penicillins, Pioglitazone, Poison sumac extract, Prilosec [omeprazole], Silver sulfadiazine, Spironolactone, Sulfa antibiotics, Tape, Tetanus toxoid, Tramadol, and Betadine [povidone iodine]   Review of Systems Review of Systems  Constitutional:  Negative for fever.  Genitourinary:  Positive for flank pain. Negative for dysuria, frequency, hematuria and urgency.     Physical  Exam Triage Vital Signs ED Triage Vitals [04/09/23 1906]  Encounter Vitals Group     BP (!) 160/74     Systolic BP Percentile      Diastolic BP Percentile      Pulse Rate 75     Resp 16     Temp 98.6 F (37 C)     Temp Source Oral     SpO2 100 %     Weight      Height      Head Circumference      Peak Flow      Pain Score      Pain Loc      Pain Education      Exclude from Growth Chart    No data found.  Updated Vital Signs BP (!) 160/74 (BP Location: Left Arm)   Pulse 75   Temp 98.6 F (37 C) (Oral)   Resp 16   SpO2 100%   Visual Acuity Right Eye Distance:   Left Eye Distance:   Bilateral Distance:    Right Eye Near:   Left Eye Near:    Bilateral Near:     Physical Exam Vitals and nursing note reviewed.  Constitutional:      Appearance: Normal appearance. She is not ill-appearing.  HENT:     Head: Normocephalic and atraumatic.  Pulmonary:     Comments: Patient does have tenderness with palpation of her 10th rib laterally. Chest:     Chest wall: Tenderness present.  Abdominal:     General: Abdomen is flat.     Palpations: Abdomen is soft.     Tenderness: There is no abdominal tenderness. There is no right CVA tenderness, left CVA tenderness, guarding or rebound.  Skin:    General: Skin is warm and dry.     Capillary Refill: Capillary refill takes less than 2 seconds.  Neurological:     General: No focal deficit present.     Mental Status: She is alert and oriented to person, place, and time.       UC Treatments / Results  Labs (all labs ordered are listed, but only abnormal results are displayed) Labs Reviewed  URINALYSIS, W/ REFLEX TO CULTURE (INFECTION SUSPECTED) - Abnormal; Notable for the following components:      Result Value   APPearance HAZY (*)    Ketones, ur TRACE (*)    Leukocytes,Ua SMALL (*)    Bacteria, UA FEW (*)    All other components within normal limits    EKG   Radiology No results found.  Procedures Procedures (including critical care time)  Medications Ordered in UC Medications - No data to display  Initial Impression / Assessment and Plan / UC Course  I have reviewed the triage vital signs and the nursing notes.  Pertinent labs & imaging results that were available during my care of the patient were reviewed by me and considered in my medical decision making (see chart for details).   Patient is a pleasant, nontoxic-appearing 77 year old female coming in for evaluation of 5 days worth of flank pain that she is concerned might be related to a kidney or urinary tract infection.  She is not having any urinary symptoms but she is typically does not have UTI symptoms due to her Sjogren's syndrome.  She reports that the pain does wrap around to the front to the right upper quadrant of her abdomen but she does not have a gallbladder.  She does have a significant hiatal  hernia that has not been repaired as well as dysfunction of her distal esophagus.  On exam her abdomen is protuberant but soft and nontender to palpation.  She does have pain with palpation of the lateral flank over the 10th rib but no appreciable crepitus.  She also has no CVA tenderness.  I will do a urinalysis to evaluate for the presence of UTI.  Urinalysis shows hazy appearance with trace ketones and small leukocyte esterase.  Negative for nitrites or protein.  Reflex microscopy shows skin contamination with 11-20 squamous epithelials, greater than 50 WBCs, few bacteria, WBC clumps,  and budding yeast.  I will discharge patient home with a diagnosis of urinary tract infection and start her on fosfomycin 1 g q. 48 hours x 3 doses for treatment of her UTI.  I also prescribe Diflucan 150 mg tablets and she have her take 1 tablet now and repeat dosing every 3 days for total of 3 doses.   Final Clinical Impressions(s) / UC Diagnoses   Final diagnoses:  Lower urinary tract infectious disease  Vaginal yeast infection     Discharge Instructions      Your urinalysis did show that you have a significant urinary tract infection.  Please take the fosfomycin 1 g every 48 hours for 3 doses for treatment of your UTI.  Increase your oral fluid intake so that you increase your urine production and help flush your urinary tract.  You also had budding yeast on your microscopic analysis of your urine which is most likely coming from your vaginal vault.  Take Diflucan 150 mg tablets, 1 tablet now and repeat dosing every 3 days for total of 3 doses.  If your symptoms or not improving, or new symptoms develop or worsen, please return for reevaluation or see your primary care provider.     ED Prescriptions     Medication Sig Dispense Auth. Provider   fosfomycin (MONUROL) 3 g PACK Take 1 pack every 48 hours x 3 doses 3 g Becky Augusta, NP   fluconazole (DIFLUCAN) 150 MG tablet Take 1 tablet (150 mg total) by mouth every 3 (three) days for 3 doses. 3 tablet Becky Augusta, NP      PDMP not reviewed this encounter.   Becky Augusta, NP 04/09/23 1941

## 2023-04-09 NOTE — Discharge Instructions (Addendum)
Your urinalysis did show that you have a significant urinary tract infection.  Please take the fosfomycin 1 g every 48 hours for 3 doses for treatment of your UTI.  Increase your oral fluid intake so that you increase your urine production and help flush your urinary tract.  You also had budding yeast on your microscopic analysis of your urine which is most likely coming from your vaginal vault.  Take Diflucan 150 mg tablets, 1 tablet now and repeat dosing every 3 days for total of 3 doses.  If your symptoms or not improving, or new symptoms develop or worsen, please return for reevaluation or see your primary care provider.

## 2023-04-10 ENCOUNTER — Ambulatory Visit: Payer: Medicare Other

## 2023-07-10 ENCOUNTER — Other Ambulatory Visit: Payer: Self-pay | Admitting: Cardiovascular Disease

## 2023-07-10 DIAGNOSIS — E7800 Pure hypercholesterolemia, unspecified: Secondary | ICD-10-CM

## 2023-07-19 ENCOUNTER — Ambulatory Visit
Admission: EM | Admit: 2023-07-19 | Discharge: 2023-07-19 | Disposition: A | Discharge status: Home / Self Care | Payer: Medicare Other | Attending: Family Medicine | Admitting: Family Medicine

## 2023-07-19 ENCOUNTER — Encounter: Payer: Self-pay | Admitting: Emergency Medicine

## 2023-07-19 DIAGNOSIS — N3000 Acute cystitis without hematuria: Secondary | ICD-10-CM | POA: Diagnosis present

## 2023-07-19 LAB — URINALYSIS, W/ REFLEX TO CULTURE (INFECTION SUSPECTED)
Bilirubin Urine: NEGATIVE
Glucose, UA: NEGATIVE mg/dL
Hgb urine dipstick: NEGATIVE
Nitrite: POSITIVE — AB
Protein, ur: NEGATIVE mg/dL
Specific Gravity, Urine: 1.02 (ref 1.005–1.030)
WBC, UA: >50 WBC/hpf (ref 0–5)
pH: 5.5 (ref 5.0–8.0)

## 2023-07-19 MED ORDER — CEFUROXIME AXETIL 250 MG PO TABS: 250.0000 mg | ORAL_TABLET | Freq: Two times a day (BID) | ORAL | 0 refills | Status: AC

## 2023-07-19 NOTE — ED Triage Notes (Signed)
 Pt c/o urinary incontinence. Started about 5 days ago. Denies dysuria, new lower back pain or fever.

## 2023-07-19 NOTE — Discharge Instructions (Signed)
 Stop by the pharmacy to pick up your prescriptions.  Follow up with your primary care provider or return to the urgent care, if not improving.

## 2023-07-19 NOTE — ED Provider Notes (Signed)
 [facility]    CSN: [id number] Arrival date & time: 07/19/23  1750      History   Chief Complaint Chief Complaint  Patient presents with   Urinary Incontinence     HPI HPI [name] is a 78 y.o. female.    [name] presents for concern for a UTI.  Has been having urinary incontinence for the past 5 days. No dysuria, back pain or fever. Does have an irritation for frequency urination.  Took cefuroxime  previously that has helped.  Denies vaginal discharge.  Patient has had a hysterectomy.        Past Medical History:  Diagnosis Date   Anterolisthesis    Cervical spine   Aortic arch aneurysm (HCC)    Asthma    Connective tissue disorder (HCC)    recurrent carotid arteritis, temporal arteritis, vasculitis mandible, general hepatitis, avascular necrosis bilat   DDD (degenerative disc disease), lumbar    Diabetes mellitus without complication (HCC)    Diverticulosis    Duodenitis    Ganglion cyst of right foot    Gouty arthritis    Hiatal hernia with GERD    History of cardiovascular stress test    a. 07/2018 [facility]): Fixed inferoapical defect w/ nl contraction-->attenuation artifact. No ischemia. EF 63%.   Hyperlipidemia    Hypertension    a. 02/2019 Renal artery duplex: no evidence of prox RAS.   Hyperuricemia    Keratitis sicca, bilateral    Lymphedema of both lower extremities    Osteoarthritis    Pericarditis    Recurrent: Aug 97, July 05, Sept 08, July 16, July 18   PUD (peptic ulcer disease)    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HCC)    Sjogren's syndrome (HCC)    Syncope    Tendonitis, Achilles, right    Tortuous colon    Trigger finger     Patient Active Problem List   Diagnosis Date Noted   Acute UTI 03/18/2023   Abdominal pain 07/05/2021   H/O total hip arthroplasty, right 07/12/2020   Status post left partial knee replacement 07/12/2020   Nausea 03/31/2020   Multiple thyroid  nodules 12/28/2019   Thyroid  nodule  09/29/2019   Lymphedema 11/10/2018   PAD (peripheral artery disease) (HCC) 10/12/2018   Aortic atherosclerosis (HCC) 10/12/2018   Carotid stenosis, asymptomatic, bilateral 10/12/2018   Positive colorectal cancer screening using Cologuard test 10/09/2018   Gastroesophageal reflux disease    Acute gastritis without hemorrhage    Osteoarthritis 04/29/2018   Hiatal hernia with GERD 04/29/2018   Hyperlipidemia 04/29/2018   Lymphedema of both lower extremities 04/29/2018   Left knee pain 10/23/2016   Cataracts, bilateral 07/31/2016   Glaucoma 07/31/2016   Lateral epicondylitis of left elbow 10/20/2015   Vitamin D deficiency 10/20/2015   Type 2 diabetes, controlled, with neuropathy (HCC) 10/16/2015   Type II diabetes mellitus with complication (HCC) 10/16/2015   Gouty arthritis 06/03/2015   H/O syncope 06/03/2015   Mild intermittent asthma 06/03/2015   Multiple gastric ulcers 06/03/2015   Schatzki's ring 06/03/2015   Undifferentiated connective tissue disease (HCC) 06/03/2015   Bone disorder 04/05/2015   Dental injury 04/05/2015   Pericarditis 04/05/2015   Sjogren's syndrome (HCC) 09/07/2014   Elevated alkaline phosphatase level 08/24/2014   Lactose intolerance 08/24/2014   Obesity 08/24/2014   Peripheral edema 08/24/2014   Benign hypertension with CKD (chronic kidney disease) stage III 08/03/2014   Abdominal adhesions 08/02/2014   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) 08/02/2014   Degenerative joint disease (DJD) of lumbar spine 08/02/2014   Diverticulosis of both small and large intestine 08/02/2014   Hyperuricemia 08/02/2014   Pure hypercholesterolemia 08/02/2014   Trigger finger 08/02/2014   Right shoulder pain 07/29/2014   Benign neoplasm of colon 06/30/2010   Right upper quadrant pain 06/30/2010    Past Surgical History:  Procedure Laterality Date   ABDOMINAL HYSTERECTOMY     BREAST BIOPSY     BREAST EXCISIONAL BIOPSY Left 1986   neg   CHOLECYSTECTOMY      COLONOSCOPY WITH PROPOFOL  N/A 08/26/2018   Procedure: COLONOSCOPY WITH PROPOFOL ;  Surgeon: Jinny Carmine, MD;  Location: [facility];  Service: Endoscopy;  Laterality: N/A;   CYSTOSCOPY  02/26/2018   Ozell Burkes, MD    distal arthrectomy     ESOPHAGOGASTRODUODENOSCOPY (EGD) WITH PROPOFOL  N/A 08/26/2018   Procedure: ESOPHAGOGASTRODUODENOSCOPY (EGD) WITH PROPOFOL ;  Surgeon: Jinny Carmine, MD;  Location: [facility];  Service: Endoscopy;  Laterality: N/A;   EXCISION NEUROMA     KNEE SURGERY Bilateral    1985, 2001, 11/2002, 12/2006   panhysterectomy  10/1983   SHOULDER SURGERY Right    reverse total arthroplasty w/ biceps tenodesis   TONSILLECTOMY AND ADENOIDECTOMY     TOTAL HIP ARTHROPLASTY Right 04/2007   WISDOM TOOTH EXTRACTION      OB History   No obstetric history on file.      Home Medications    Prior to Admission medications   Medication Sig Start Date End Date Taking? Authorizing Provider  cefUROXime  (CEFTIN ) 250 MG tablet Take 1 tablet (250 mg total) by mouth 2 (two) times daily with a meal for 10 days. 07/19/23 07/29/23 Yes Shantasia Hunnell, DO  acetaminophen (TYLENOL) 500 MG tablet Take 500 mg by mouth daily as needed.    [provider]  albuterol  (VENTOLIN  HFA) 108 (90 Base) MCG/ACT inhaler Inhale 1-2 puffs into the lungs every 4 (four) hours as needed for wheezing or shortness of breath. 06/03/21   Arvis Jolan NOVAK, PA-C  ARTIFICIAL TEAR OP Apply to eye as needed.     [provider]  BD INSULIN  SYRINGE U/F 31G X 5/16 0.5 ML MISC USE AS DIRECTED Patient not taking: Reported on 09/03/2022 08/30/20   Auston Reyes BIRCH, MD  BD INSULIN  SYRINGE U/F 31G X 5/16 1 ML MISC USE AS DIRECTED. WITH HUMALOG  Patient not taking: Reported on 09/03/2022 07/28/18   Nyle Tinnie Fuller, NP  BIOTIN PO Take 1 mg by mouth daily.  Patient not taking: Reported on 09/03/2022    [provider]  budesonide -formoterol  (SYMBICORT ) 80-4.5 MCG/ACT inhaler Inhale 2 puffs into  the lungs 2 (two) times daily. Patient not taking: Reported on 09/03/2022 06/09/21   Rodriguez-Southworth, Sylvia, PA-C  Cholecalciferol (VITAMIN D3) 25 MCG (1000 UT) CAPS Take 4 capsules by mouth daily.     [provider]  cloNIDine  (CATAPRES ) 0.1 MG tablet Take 1 tablet (0.1 mg total) by mouth 3 (three) times daily. 09/18/22 12/17/22  Gollan, Timothy J, MD  COLCRYS  0.6 MG tablet Take 1 tablet (0.6 mg total) by mouth 2 (two) times daily as needed. For up to 3 days for pericarditis, or up to 7 days for gout or connective tissue disorder. 01/14/20   Karamalegos, Marsa PARAS, DO  cyclobenzaprine  (FLEXERIL ) 5 MG tablet Take 1 tablet (5 mg total) by mouth daily as needed for muscle spasms. 05/26/20   Malfi, Nat HERO, FNP  doxazosin (CARDURA) 1 MG tablet Take 1 tablet by mouth  at bedtime. 10/26/22 10/26/23  [provider]  ezetimibe  (ZETIA ) 10 MG tablet TAKE 1 TABLET BY MOUTH DAILY Patient not taking: Reported on 09/03/2022 02/26/22   Gollan, Timothy J, MD  famotidine (PEPCID) 40 MG tablet Take 40 mg by mouth 2 (two) times daily.    [provider]  fosfomycin (MONUROL ) 3 g PACK Take 1 pack every 48 hours x 3 doses 04/09/23   Bernardino Ditch, NP  FREESTYLE LITE test strip USE AS DIRECTED THREE TIMES DAILY FOR DIABETES 03/03/20   Fredrik Nat HERO, FNP  insulin  degludec (TRESIBA  FLEXTOUCH) 100 UNIT/ML FlexTouch Pen Inject 72 Units into the skin daily.    [provider]  insulin  lispro (HUMALOG ) 100 UNIT/ML injection Correction scale: take 1 unit per 50 over 150 before meals up to three times daily.  Up to 20 units per day. 02/12/19   Nyle Tinnie Fuller, NP  Insulin  Pen Needle (PEN NEEDLES) 32G X 5 MM MISC 1 Device by Does not apply route daily. Patient not taking: Reported on 09/03/2022 08/26/19   Fredrik Nat HERO, FNP  lactase (LACTAID) 3000 units tablet Take by mouth as needed.     [provider]  Lancets (FREESTYLE) lancets Must test x4/day Patient not taking: Reported on  09/03/2022 06/19/16   [provider]  nebivolol  (BYSTOLIC ) 10 MG tablet Take 1 tablet (10 mg total) by mouth 2 (two) times daily. 12/26/22   Zhao, Xika, NP  ondansetron  (ZOFRAN -ODT) 4 MG disintegrating tablet Take 1 tablet (4 mg total) by mouth every 8 (eight) hours as needed for nausea or vomiting. Patient not taking: Reported on 09/03/2022 03/29/20   Fredrik Nat HERO, FNP  phenazopyridine  (PYRIDIUM ) 200 MG tablet Take 1 tablet (200 mg total) by mouth 3 (three) times daily. Patient not taking: Reported on 09/03/2022 03/27/22   Bernardino Ditch, NP  rosuvastatin  (CRESTOR ) 10 MG tablet TAKE 1 TABLET BY MOUTH DAILY 07/11/23   Gollan, Timothy J, MD  traMADol (ULTRAM) 50 MG tablet Take 50 mg by mouth every 8 (eight) hours as needed.    [provider]    Family History Family History  Problem Relation Age of Onset   Hypertension Mother    Diabetes Mother    Hyperlipidemia Mother    Congestive Heart Failure Mother    Cancer Mother    Non-Hodgkin's lymphoma Sister    Cancer Sister    Diabetes Maternal Grandmother    Breast cancer Neg Hx     Social History Social History   Tobacco Use   Smoking status: Former    Current packs/day: 0.00    Types: Cigarettes    Quit date: 01/22/1996    Years since quitting: 27.5   Smokeless tobacco: Never  Vaping Use   Vaping status: Never Used  Substance Use Topics   Alcohol use: Yes    Comment: occasional-once every 6 months   Drug use: Never     Allergies   Amlodipine, Aspirin, Bee venom, Ciprofloxacin, Codeine, Egg-derived products, Erythromycin, Glimepiride, Hydralazine, Hydrochlorothiazide , Hydrocodone, Hydrocodone-acetaminophen, Hydromorphone, Irbesartan, Irbesartan-hydrochlorothiazide , Lisinopril, Maxitrol [neomycin-polymyxin-dexameth], Metformin and related, Metformin hcl, Methylcellulose, Metoclopramide, Metoprolol, Minoxidil, Neomycin-bacitracin zn-polymyx, Norvasc [amlodipine besylate], Nsaids, Omeprazole magnesium, Other,  Oxycodone-acetaminophen, Penicillins, Pioglitazone, Poison sumac extract, Prilosec [omeprazole], Silver sulfadiazine, Spironolactone, Sulfa antibiotics, Tape, Tetanus toxoid, Tramadol, and Betadine [povidone iodine]   Review of Systems Review of Systems: :negative unless otherwise stated in HPI.      Physical Exam Triage Vital Signs ED Triage Vitals  Encounter Vitals Group  BP 07/19/23 1909 (!) 147/74     Systolic BP Percentile --      Diastolic BP Percentile --      Pulse Rate 07/19/23 1909 89     Resp 07/19/23 1909 16     Temp 07/19/23 1909 98.5 F (36.9 C)     Temp Source 07/19/23 1909 Oral     SpO2 07/19/23 1909 100 %     Weight 07/19/23 1909 231 lb 14.8 oz (105.2 kg)     Height 07/19/23 1909 5' 9 (1.753 m)     Head Circumference --      Peak Flow --      Pain Score 07/19/23 1908 0     Pain Loc --      Pain Education --      Exclude from Growth Chart --    No data found.  Updated Vital Signs BP (!) 147/74 (BP Location: Right Arm)   Pulse 89   Temp 98.5 F (36.9 C) (Oral)   Resp 16   Ht 5' 9 (1.753 m)   Wt 105.2 kg   SpO2 100%   BMI 34.25 kg/m   Visual Acuity Right Eye Distance:   Left Eye Distance:   Bilateral Distance:    Right Eye Near:   Left Eye Near:    Bilateral Near:     Physical Exam GEN: well appearing female in no acute distress  CVS: well perfused  RESP: speaking in full sentences without pause    UC Treatments / Results  Labs (all labs ordered are listed, but only abnormal results are displayed) Labs Reviewed  URINALYSIS, W/ REFLEX TO CULTURE (INFECTION SUSPECTED) - Abnormal; Notable for the following components:      Result Value   APPearance HAZY (*)    Ketones, ur TRACE (*)    Nitrite POSITIVE (*)    Leukocytes,Ua MODERATE (*)    Bacteria, UA MANY (*)    All other components within normal limits  URINE CULTURE    EKG   Radiology No results found.  Procedures Procedures (including critical care  time)  Medications Ordered in UC Medications - No data to display  Initial Impression / Assessment and Plan / UC Course  I have reviewed the triage vital signs and the nursing notes.  Pertinent labs & imaging results that were available during my care of the patient were reviewed by me and considered in my medical decision making (see chart for details).        Acute cystitis:  Patient is a 78 y.o. female  who presents for 5 days of urinary frequency.  Overall patient is well-appearing and afebrile.  Vital signs stable.  UA consistent with acute cystitis.  Hematuria not supported on microscopy.  Treat with cefuroxime  2 times daily for 10 days.  Urine culture obtained.  Follow-up sensitivities and change antibiotics, if needed.   Return precautions including abdominal pain, fever, chills, nausea, or vomiting given. Follow-up,  if symptoms not improving or getting worse. Discussed MDM, treatment plan and plan for follow-up with patient who agrees with plan.        Final Clinical Impressions(s) / UC Diagnoses   Final diagnoses:  Acute cystitis without hematuria     Discharge Instructions      Stop by the pharmacy to pick up your prescriptions.  Follow up with your primary care provider or return to the urgent care,  if not improving.       ED Prescriptions  Medication Sig Dispense Auth. Provider   cefUROXime  (CEFTIN ) 250 MG tablet Take 1 tablet (250 mg total) by mouth 2 (two) times daily with a meal for 10 days. 20 tablet Jeanett Antonopoulos, DO      PDMP not reviewed this encounter.   Kriste Berth, DO 07/25/23 1708

## 2023-07-21 LAB — URINE CULTURE: Culture: >=100000 — AB

## 2023-08-11 ENCOUNTER — Ambulatory Visit
Admission: EM | Admit: 2023-08-11 | Discharge: 2023-08-11 | Discharge status: Against Medical Advice | Attending: Emergency Medicine | Admitting: Emergency Medicine

## 2023-08-11 ENCOUNTER — Encounter: Payer: Self-pay | Admitting: Emergency Medicine

## 2023-08-11 DIAGNOSIS — R531 Weakness: Secondary | ICD-10-CM

## 2023-08-11 DIAGNOSIS — M7989 Other specified soft tissue disorders: Secondary | ICD-10-CM | POA: Diagnosis not present

## 2023-08-11 NOTE — Discharge Instructions (Signed)
 As we discussed, there could be a number of emergent reasons of your symptoms including, but not limited to, acute congestive heart failure, MI, blood clot in your leg or lung, electrolyte imbalance, acute kidney injury.  I believe you need to go to the emergency department to evaluate for these entities.  If you do not go, then I would encourage you to follow-up with your cardiologist tomorrow, the ER for any change in your symptoms or concerns.

## 2023-08-11 NOTE — ED Provider Notes (Signed)
 HPI  SUBJECTIVE:  [name] is a 78 y.o. female who presents with 1 week of generalized weakness and a sensation of "feeling quivering inside", worsening lower extremity edema despite usual measures, unintentional 5 pound weight gain.  No fevers, nausea, vomiting, coughing, wheezing, shortness of breath, dyspnea on exertion, abdominal pain.  No orthopnea, nocturia, PND.  No chest pain, pressure, heaviness, diaphoresis.  No diarrhea, urinary complaints.  No headache, visual changes, leg/arm/facial numbness/tingling/weakness, aphasia, slurred speech.  No recent change in her medications.  She tried rest without improvement in her symptoms.  No aggravating factors.  There is no exertional component to this. Patient has a complex past medical history of diabetes, asthma, gout, hyperlipidemia, labile hypertension, bilateral lower extremity lymphedema, recurrent pericarditis, peptic ulcer disease, peripheral arterial disease, UTI, GERD/Schatzki's ring, aortic arch aneurysm, connective tissue disease/Sjogren syndrome, hypercholesterolemia, vasomotor syncope, BMI above 30, murmur.  No history of CHF, PE, DVT, CVA, cancer, pulmonary disease, interstitial lung disease, MI, PAD/PVD, chronic kidney disease, arrhythmia.  Family history significant for mother with MI at age 80.  [facility].  Past Medical History:  Diagnosis Date   Anterolisthesis    Cervical spine   Aortic arch aneurysm (HCC)    Asthma    Connective tissue disorder (HCC)    recurrent carotid arteritis, temporal arteritis, vasculitis mandible, general hepatitis, avascular necrosis bilat   DDD (degenerative disc disease), lumbar    Diabetes mellitus without complication (HCC)    Diverticulosis    Duodenitis    Ganglion cyst of right foot    Gouty arthritis    Hiatal hernia with GERD    History of cardiovascular stress test    a. 07/2018 [facility]): Fixed inferoapical defect w/ nl contraction-->attenuation artifact.  No ischemia. EF 63%.   Hyperlipidemia    Hypertension    a. 02/2019 Renal artery duplex: no evidence of prox RAS.   Hyperuricemia    Keratitis sicca, bilateral    Lymphedema of both lower extremities    Osteoarthritis    Pericarditis    Recurrent: Aug 97, July 05, Sept 08, July 16, July 18   PUD (peptic ulcer disease)    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HCC)    Sjogren's syndrome (HCC)    Syncope    Tendonitis, Achilles, right    Tortuous colon    Trigger finger     Past Surgical History:  Procedure Laterality Date   ABDOMINAL HYSTERECTOMY     BREAST BIOPSY     BREAST EXCISIONAL BIOPSY Left 1986   neg   CHOLECYSTECTOMY     COLONOSCOPY WITH PROPOFOL N/A 08/26/2018   Procedure: COLONOSCOPY WITH PROPOFOL;  Surgeon: Midge Minium, MD;  Location: [facility] ENDOSCOPY;  Service: Endoscopy;  Laterality: N/A;   CYSTOSCOPY  02/26/2018   Anola Gurney, MD    distal arthrectomy     ESOPHAGOGASTRODUODENOSCOPY (EGD) WITH PROPOFOL N/A 08/26/2018   Procedure: ESOPHAGOGASTRODUODENOSCOPY (EGD) WITH PROPOFOL;  Surgeon: Midge Minium, MD;  Location: [facility] ENDOSCOPY;  Service: Endoscopy;  Laterality: N/A;   EXCISION NEUROMA     KNEE SURGERY Bilateral    1985, 2001, 11/2002, 12/2006   panhysterectomy  10/1983   SHOULDER SURGERY Right    reverse total arthroplasty w/ biceps tenodesis   TONSILLECTOMY AND ADENOIDECTOMY     TOTAL HIP ARTHROPLASTY Right 04/2007   WISDOM TOOTH EXTRACTION      Family History  Problem Relation Age of Onset   Hypertension Mother    Diabetes Mother    Hyperlipidemia Mother  Congestive Heart Failure Mother    Cancer Mother    Non-Hodgkin's lymphoma Sister    Cancer Sister    Diabetes Maternal Grandmother    Breast cancer Neg Hx     Social History   Tobacco Use   Smoking status: Former    Current packs/day: 0.00    Types: Cigarettes    Quit date: 01/22/1996    Years since quitting: 27.5   Smokeless tobacco: Never  Vaping Use   Vaping status: Never Used  Substance Use Topics    Alcohol use: Yes    Comment: occasional-once every 6 months   Drug use: Never    No current facility-administered medications for this encounter.  Current Outpatient Medications:    acetaminophen (TYLENOL) 500 MG tablet, Take 500 mg by mouth daily as needed., Disp: , Rfl:    albuterol (VENTOLIN HFA) 108 (90 Base) MCG/ACT inhaler, Inhale 1-2 puffs into the lungs every 4 (four) hours as needed for wheezing or shortness of breath., Disp: 1 g, Rfl: 0   ARTIFICIAL TEAR OP, Apply to eye as needed. , Disp: , Rfl:    BD INSULIN SYRINGE U/F 31G X 5/16" 0.5 ML MISC, USE AS DIRECTED (Patient not taking: Reported on 09/03/2022), Disp: 100 each, Rfl: 2   BD INSULIN SYRINGE U/F 31G X 5/16" 1 ML MISC, USE AS DIRECTED. WITH HUMALOG (Patient not taking: Reported on 09/03/2022), Disp: 100 each, Rfl: 5   BIOTIN PO, Take 1 mg by mouth daily.  (Patient not taking: Reported on 09/03/2022), Disp: , Rfl:    budesonide-formoterol (SYMBICORT) 80-4.5 MCG/ACT inhaler, Inhale 2 puffs into the lungs 2 (two) times daily. (Patient not taking: Reported on 09/03/2022), Disp: 1 each, Rfl: 0   Cholecalciferol (VITAMIN D3) 25 MCG (1000 UT) CAPS, Take 4 capsules by mouth daily. , Disp: , Rfl:    cloNIDine (CATAPRES) 0.1 MG tablet, Take 1 tablet (0.1 mg total) by mouth 3 (three) times daily., Disp: 270 tablet, Rfl: 3   COLCRYS 0.6 MG tablet, Take 1 tablet (0.6 mg total) by mouth 2 (two) times daily as needed. For up to 3 days for pericarditis, or up to 7 days for gout or connective tissue disorder., Disp: 60 tablet, Rfl: 2   cyclobenzaprine (FLEXERIL) 5 MG tablet, Take 1 tablet (5 mg total) by mouth daily as needed for muscle spasms., Disp: 30 tablet, Rfl: 2   doxazosin (CARDURA) 1 MG tablet, Take 1 tablet by mouth at bedtime., Disp: , Rfl:    ezetimibe (ZETIA) 10 MG tablet, TAKE 1 TABLET BY MOUTH DAILY (Patient not taking: Reported on 09/03/2022), Disp: 90 tablet, Rfl: 1   famotidine (PEPCID) 40 MG tablet, Take 40 mg by mouth 2 (two)  times daily., Disp: , Rfl:    fosfomycin (MONUROL) 3 g PACK, Take 1 pack every 48 hours x 3 doses, Disp: 3 g, Rfl: 0   FREESTYLE LITE test strip, USE AS DIRECTED THREE TIMES DAILY FOR DIABETES, Disp: 100 strip, Rfl: 4   insulin degludec (TRESIBA FLEXTOUCH) 100 UNIT/ML FlexTouch Pen, Inject 72 Units into the skin daily., Disp: , Rfl:    insulin lispro (HUMALOG) 100 UNIT/ML injection, Correction scale: take 1 unit per 50 over 150 before meals up to three times daily.  Up to 20 units per day., Disp: 20 mL, Rfl: 1   Insulin Pen Needle (PEN NEEDLES) 32G X 5 MM MISC, 1 Device by Does not apply route daily. (Patient not taking: Reported on 09/03/2022), Disp: 100 each, Rfl: 3  lactase (LACTAID) 3000 units tablet, Take by mouth as needed. , Disp: , Rfl:    Lancets (FREESTYLE) lancets, Must test x4/day (Patient not taking: Reported on 09/03/2022), Disp: , Rfl:    nebivolol (BYSTOLIC) 10 MG tablet, Take 1 tablet (10 mg total) by mouth 2 (two) times daily., Disp: 60 tablet, Rfl: 1   ondansetron (ZOFRAN-ODT) 4 MG disintegrating tablet, Take 1 tablet (4 mg total) by mouth every 8 (eight) hours as needed for nausea or vomiting. (Patient not taking: Reported on 09/03/2022), Disp: 30 tablet, Rfl: 0   phenazopyridine (PYRIDIUM) 200 MG tablet, Take 1 tablet (200 mg total) by mouth 3 (three) times daily. (Patient not taking: Reported on 09/03/2022), Disp: 6 tablet, Rfl: 0   rosuvastatin (CRESTOR) 10 MG tablet, TAKE 1 TABLET BY MOUTH DAILY, Disp: 90 tablet, Rfl: 0   traMADol (ULTRAM) 50 MG tablet, Take 50 mg by mouth every 8 (eight) hours as needed., Disp: , Rfl:   Allergies  Allergen Reactions   Amlodipine     Other reaction(s): Unknown   Aspirin     Other reaction(s): Unknown   Bee Venom    Ciprofloxacin     Other reaction(s): Other (see comments), Unknown   Codeine     Other reaction(s): Unknown Must have pre medications before taking per patient report Includes all derivatives    Egg-Derived Products Other  (See Comments)   Erythromycin     Other reaction(s): Unknown, Unknown   Glimepiride     Other reaction(s): Unknown   Hydralazine     Other reaction(s): Unknown   Hydrochlorothiazide     Other reaction(s): Dizziness or lightheadedness.   Hydrocodone     Other reaction(s): Unknown Must have pre medications before taking per patient report   Hydrocodone-Acetaminophen Nausea And Vomiting    MUST BE PREMEDICATED   Hydromorphone Nausea And Vomiting    Other reaction(s): Unknown Must have pre medications before taking per patient report MUST BE PREMEDICATED    Irbesartan     Other reaction(s): Unknown   Irbesartan-Hydrochlorothiazide     Other reaction(s): Unknown   Lisinopril     Pulmonary    Maxitrol [Neomycin-Polymyxin-Dexameth]     Eye drop     Metformin And Related    Metformin Hcl     Other reaction(s): Unknown   Methylcellulose     Other reaction(s): Unknown Eye drops     Metoclopramide Nausea And Vomiting    Other reaction(s): Unknown   Metoprolol     Other reaction(s): Unknown   Minoxidil     Other reaction(s): Other (See Comments) Severe leg pain, not feeling like self   Neomycin-Bacitracin Zn-Polymyx     Rash     Norvasc [Amlodipine Besylate]    Nsaids Other (See Comments)    Other reaction(s): Unknown bleeding    Omeprazole Magnesium     Other reaction(s): Unknown   Other     Pt is allergic to staples and surgical metals   Oxycodone-Acetaminophen     Other reaction(s): Unknown, Unknown Must have pre medications before taking per patient report    Penicillins     Hives     Pioglitazone     Other reaction(s): Unknown   Poison [place] Extract     Poison [place] and [place] also   Prilosec [Omeprazole]     Severe stomach pain  Rash  Severe headache    Silver Sulfadiazine     Other reaction(s): Unknown   Spironolactone     Other reaction(s): Other (  See Comments) Severe leg pain, not feeling like self   Sulfa Antibiotics     Severe skin rash     Tape      Other reaction(s): Unknown   Tetanus Toxoid Other (See Comments)   Tramadol     Other reaction(s): Unknown Must have pre medications before taking per patient report   Betadine [Povidone Iodine] Rash    Only topically when left on skin. and bandages      ROS  As noted in HPI.   Physical Exam  BP (!) 180/80 (BP Location: Left Arm)   Pulse 74   Temp 98.7 F (37.1 C) (Oral)   Resp 15   Ht 5\' 9"  (1.753 m)   Wt 105.2 kg   SpO2 97%   BMI 34.25 kg/m  BP Readings from Last 3 Encounters:  08/11/23 (!) 180/80  07/19/23 (!) 147/74  04/09/23 (!) 160/74    Constitutional: Well developed, well nourished, no acute distress Eyes: PERRL, EOMI, conjunctiva normal bilaterally HENT: Normocephalic, atraumatic,mucus membranes moist Respiratory: Clear to auscultation bilaterally, no rales, no wheezing, no rhonchi Cardiovascular: Normal rate and rhythm, positive murmur, no gallops, no rubs GI: Soft, nondistended, normal bowel sounds, nontender, no rebound, no guarding Back: no CVAT skin: No rash, skin intact Musculoskeletal: Wearing compression stockings.  Bilateral lower extremity edema, worse on the left.  No calf tenderness, popliteal tenderness.  No palpable cord.  Tenderness along the left medial thigh, but she states that this is an ongoing issue.  Cap refill toes approximately 3 seconds. Neurologic: Alert & oriented x 3, CN III-XII grossly intact, no motor deficits, sensation grossly intact.  Speech fluent. Psychiatric: Speech and behavior appropriate   ED Course   Medications - No data to display  Orders Placed This Encounter  Procedures   Recheck vitals    15 minutes    Standing Status:   Standing    Number of Occurrences:   1   ED EKG    Standing Status:   Standing    Number of Occurrences:   1    Reason for Exam:   Weakness   EKG 12-Lead    Standing Status:   Standing    Number of Occurrences:   1   No results found for this or any previous visit (from the past  24 hours). No results found.  ED Clinical Impression  1. Weakness   2. Leg swelling      ED Assessment/Plan     EKG: Normal sinus rhythm, rate 70.  Normal axis, normal walls.  No hypertrophy.  No ST-T wave changes.  No change compared to EKG from 09/04/2022  I had an extensive discussion with patient that generalized weakness and tractable lower extremity edema can be caused by a number of different emergent issues such as, but not limited to, acute CHF, MI, PE, DVT, electrolyte imbalance, acute kidney injury, hypertensive emergency.  No evidence of stroke.  Given her complex medical history and broad differential, advised that she needs to go to the emergency department for comprehensive evaluation. Discussed with her I do not have the tools to evaluate for MI,CHF, DVT.  While her physical exam is unrevealing and EKG is reassuring, she is refusing to go to the ED.  States that she feels fine, and will follow-up with her cardiologist tomorrow.  Encouraged close follow-up with cardiology tomorrow, and encouraged her to go to the ED for any concerns.  Patient will sign out AMA.  No orders of the  defined types were placed in this encounter.     *This clinic note was created using Dragon dictation software. Therefore, there may be occasional mistakes despite careful proofreading. ?    Domenick Gong, MD 08/11/23 1704

## 2023-08-11 NOTE — ED Triage Notes (Signed)
 Patient reports weakness and worsening swelling in her lower legs, that started last week.

## 2023-08-26 ENCOUNTER — Encounter: Payer: Self-pay | Admitting: Physician Assistant

## 2023-09-17 ENCOUNTER — Other Ambulatory Visit: Payer: Self-pay | Admitting: Cardiovascular Disease

## 2023-09-17 DIAGNOSIS — E7800 Pure hypercholesterolemia, unspecified: Secondary | ICD-10-CM

## 2023-11-13 ENCOUNTER — Other Ambulatory Visit: Payer: Self-pay | Admitting: Cardiovascular Disease

## 2023-11-13 DIAGNOSIS — E7800 Pure hypercholesterolemia, unspecified: Secondary | ICD-10-CM

## 2023-11-28 ENCOUNTER — Other Ambulatory Visit: Payer: Self-pay | Admitting: Cardiovascular Disease

## 2023-11-28 DIAGNOSIS — E7800 Pure hypercholesterolemia, unspecified: Secondary | ICD-10-CM
